# Patient Record
Sex: Female | Born: 1940 | Race: White | Hispanic: No | Smoking: Former smoker
Health system: Southern US, Community
[De-identification: ages and names within clinical notes are randomized; demographics above are authoritative.]

## PROBLEM LIST (undated history)

## (undated) DIAGNOSIS — Z87891 Personal history of nicotine dependence: Secondary | ICD-10-CM

## (undated) DIAGNOSIS — I7100 Dissection of unspecified site of aorta: Secondary | ICD-10-CM

## (undated) DIAGNOSIS — I219 Acute myocardial infarction, unspecified: Secondary | ICD-10-CM

## (undated) DIAGNOSIS — N6019 Diffuse cystic mastopathy of unspecified breast: Secondary | ICD-10-CM

## (undated) DIAGNOSIS — Z8709 Personal history of other diseases of the respiratory system: Secondary | ICD-10-CM

## (undated) DIAGNOSIS — I5022 Chronic systolic (congestive) heart failure: Secondary | ICD-10-CM

## (undated) DIAGNOSIS — Z8719 Personal history of other diseases of the digestive system: Secondary | ICD-10-CM

## (undated) DIAGNOSIS — E785 Hyperlipidemia, unspecified: Secondary | ICD-10-CM

## (undated) DIAGNOSIS — M1990 Unspecified osteoarthritis, unspecified site: Secondary | ICD-10-CM

## (undated) DIAGNOSIS — I255 Ischemic cardiomyopathy: Secondary | ICD-10-CM

## (undated) DIAGNOSIS — D649 Anemia, unspecified: Secondary | ICD-10-CM

## (undated) DIAGNOSIS — I10 Essential (primary) hypertension: Secondary | ICD-10-CM

## (undated) DIAGNOSIS — Z800 Family history of malignant neoplasm of digestive organs: Secondary | ICD-10-CM

## (undated) DIAGNOSIS — I509 Heart failure, unspecified: Secondary | ICD-10-CM

## (undated) DIAGNOSIS — R011 Cardiac murmur, unspecified: Secondary | ICD-10-CM

## (undated) DIAGNOSIS — IMO0002 Reserved for concepts with insufficient information to code with codable children: Secondary | ICD-10-CM

## (undated) DIAGNOSIS — I2510 Atherosclerotic heart disease of native coronary artery without angina pectoris: Secondary | ICD-10-CM

## (undated) DIAGNOSIS — G709 Myoneural disorder, unspecified: Secondary | ICD-10-CM

## (undated) DIAGNOSIS — I4510 Unspecified right bundle-branch block: Secondary | ICD-10-CM

## (undated) DIAGNOSIS — S12600A Unspecified displaced fracture of seventh cervical vertebra, initial encounter for closed fracture: Secondary | ICD-10-CM

## (undated) DIAGNOSIS — T7840XA Allergy, unspecified, initial encounter: Secondary | ICD-10-CM

## (undated) DIAGNOSIS — K5790 Diverticulosis of intestine, part unspecified, without perforation or abscess without bleeding: Secondary | ICD-10-CM

## (undated) DIAGNOSIS — H409 Unspecified glaucoma: Secondary | ICD-10-CM

## (undated) HISTORY — DX: Cardiac murmur, unspecified: R01.1

## (undated) HISTORY — PX: APPENDECTOMY: SHX54

## (undated) HISTORY — DX: Unspecified displaced fracture of seventh cervical vertebra, initial encounter for closed fracture: S12.600A

## (undated) HISTORY — DX: Allergy, unspecified, initial encounter: T78.40XA

## (undated) HISTORY — DX: Family history of malignant neoplasm of digestive organs: Z80.0

## (undated) HISTORY — PX: BLADDER SURGERY: SHX569

## (undated) HISTORY — DX: Chronic systolic (congestive) heart failure: I50.22

## (undated) HISTORY — PX: BREAST BIOPSY: SHX20

## (undated) HISTORY — DX: Diffuse cystic mastopathy of unspecified breast: N60.19

## (undated) HISTORY — DX: Anemia, unspecified: D64.9

## (undated) HISTORY — DX: Acute myocardial infarction, unspecified: I21.9

## (undated) HISTORY — DX: Heart failure, unspecified: I50.9

## (undated) HISTORY — DX: Dissection of unspecified site of aorta: I71.00

## (undated) HISTORY — DX: Reserved for concepts with insufficient information to code with codable children: IMO0002

## (undated) HISTORY — DX: Ischemic cardiomyopathy: I25.5

## (undated) HISTORY — PX: BREAST SURGERY: SHX581

## (undated) HISTORY — DX: Myoneural disorder, unspecified: G70.9

## (undated) HISTORY — DX: Unspecified osteoarthritis, unspecified site: M19.90

## (undated) HISTORY — DX: Diverticulosis of intestine, part unspecified, without perforation or abscess without bleeding: K57.90

## (undated) HISTORY — PX: COLONOSCOPY: SHX174

## (undated) HISTORY — DX: Personal history of other diseases of the digestive system: Z87.19

## (undated) HISTORY — PX: REFRACTIVE SURGERY: SHX103

## (undated) HISTORY — DX: Personal history of other diseases of the respiratory system: Z87.09

## (undated) HISTORY — DX: Atherosclerotic heart disease of native coronary artery without angina pectoris: I25.10

## (undated) HISTORY — DX: Personal history of nicotine dependence: Z87.891

## (undated) HISTORY — DX: Hyperlipidemia, unspecified: E78.5

## (undated) HISTORY — DX: Essential (primary) hypertension: I10

## (undated) HISTORY — DX: Unspecified glaucoma: H40.9

## (undated) HISTORY — PX: CARDIAC CATHETERIZATION: SHX172

## (undated) HISTORY — PX: BREAST EXCISIONAL BIOPSY: SUR124

---

## 1980-05-02 DIAGNOSIS — I10 Essential (primary) hypertension: Secondary | ICD-10-CM

## 1980-05-02 HISTORY — PX: BACK SURGERY: SHX140

## 1980-05-02 HISTORY — DX: Essential (primary) hypertension: I10

## 1989-05-02 HISTORY — PX: ABDOMINAL HYSTERECTOMY: SHX81

## 1992-05-02 HISTORY — PX: CHOLECYSTECTOMY: SHX55

## 2003-05-03 DIAGNOSIS — H409 Unspecified glaucoma: Secondary | ICD-10-CM

## 2003-05-03 DIAGNOSIS — Z8719 Personal history of other diseases of the digestive system: Secondary | ICD-10-CM

## 2003-05-03 HISTORY — DX: Personal history of other diseases of the digestive system: Z87.19

## 2003-05-03 HISTORY — DX: Unspecified glaucoma: H40.9

## 2004-02-03 ENCOUNTER — Ambulatory Visit: Payer: Self-pay | Admitting: General Surgery

## 2004-02-09 ENCOUNTER — Ambulatory Visit: Payer: Self-pay | Admitting: Family Medicine

## 2005-02-03 ENCOUNTER — Ambulatory Visit: Payer: Self-pay | Admitting: General Surgery

## 2005-06-08 ENCOUNTER — Ambulatory Visit: Payer: Self-pay | Admitting: Cardiology

## 2005-08-08 ENCOUNTER — Ambulatory Visit: Payer: Self-pay | Admitting: Internal Medicine

## 2005-09-22 ENCOUNTER — Ambulatory Visit: Payer: Self-pay | Admitting: Internal Medicine

## 2005-09-26 ENCOUNTER — Ambulatory Visit: Payer: Self-pay | Admitting: Internal Medicine

## 2006-04-04 ENCOUNTER — Ambulatory Visit: Payer: Self-pay | Admitting: General Surgery

## 2006-09-14 ENCOUNTER — Ambulatory Visit: Payer: PRIVATE HEALTH INSURANCE | Admitting: General Surgery

## 2007-04-05 ENCOUNTER — Ambulatory Visit: Payer: PRIVATE HEALTH INSURANCE | Admitting: General Surgery

## 2008-01-30 ENCOUNTER — Ambulatory Visit: Payer: Self-pay | Admitting: Family Medicine

## 2008-01-31 ENCOUNTER — Ambulatory Visit: Payer: Self-pay | Admitting: Family Medicine

## 2008-03-02 ENCOUNTER — Ambulatory Visit: Payer: Self-pay | Admitting: Family Medicine

## 2008-04-01 ENCOUNTER — Ambulatory Visit: Payer: Self-pay | Admitting: Family Medicine

## 2008-04-07 ENCOUNTER — Ambulatory Visit: Payer: Self-pay | Admitting: General Surgery

## 2008-06-05 ENCOUNTER — Ambulatory Visit: Payer: Self-pay | Admitting: Urology

## 2009-04-08 ENCOUNTER — Ambulatory Visit: Payer: Self-pay | Admitting: General Surgery

## 2010-04-12 ENCOUNTER — Ambulatory Visit: Payer: Self-pay | Admitting: General Surgery

## 2010-05-21 ENCOUNTER — Ambulatory Visit: Payer: PRIVATE HEALTH INSURANCE | Admitting: Family Medicine

## 2010-08-03 ENCOUNTER — Ambulatory Visit (INDEPENDENT_AMBULATORY_CARE_PROVIDER_SITE_OTHER): Payer: Medicare Other | Admitting: Cardiovascular Disease

## 2010-08-03 ENCOUNTER — Encounter: Payer: Self-pay | Admitting: Cardiovascular Disease

## 2010-08-03 DIAGNOSIS — I2510 Atherosclerotic heart disease of native coronary artery without angina pectoris: Secondary | ICD-10-CM | POA: Insufficient documentation

## 2010-08-03 DIAGNOSIS — I10 Essential (primary) hypertension: Secondary | ICD-10-CM

## 2010-08-03 DIAGNOSIS — E119 Type 2 diabetes mellitus without complications: Secondary | ICD-10-CM

## 2010-08-03 DIAGNOSIS — I152 Hypertension secondary to endocrine disorders: Secondary | ICD-10-CM | POA: Insufficient documentation

## 2010-08-03 DIAGNOSIS — E1159 Type 2 diabetes mellitus with other circulatory complications: Secondary | ICD-10-CM | POA: Insufficient documentation

## 2010-08-03 DIAGNOSIS — E785 Hyperlipidemia, unspecified: Secondary | ICD-10-CM

## 2010-08-03 MED ORDER — LISINOPRIL 40 MG PO TABS: 40.0000 mg | ORAL_TABLET | Freq: Every day | ORAL | Status: DC

## 2010-08-03 NOTE — Assessment & Plan Note (Signed)
Total cholesterol is 202, LDL 104, HDL 82, We will discuss this with her on her next visit. Ideally, given mild coronary artery disease and underlying diabetes, we should pain to push her LDL lower.

## 2010-08-03 NOTE — Assessment & Plan Note (Signed)
Diabetes is reasonably well controlled with hemoglobin A1c 6.8.

## 2010-08-03 NOTE — Progress Notes (Signed)
   Patient ID: Jessica Wheeler, female    DOB: Aug 08, 1940, 70 y.o.   MRN: [id number]  HPI Comments: Ms. Mckellar is a very pleasant 70 year old woman with a history of diabetes, hypertension, smoking, mild coronary artery disease by cardiac catheterization in 2007 who presents for evaluation after her recent echocardiogram showed abnormalities also to establish care.  She reports that in general she does well. She has been working on her hemoglobin A1c which is 6.8. She reports that her father died of a heart attack at age 23. Mother passed away at age 47. Her workup has included a cardiac catheterization in 2007 that from what I understand was because of syncope. The catheterization showed 20% proximal RCA disease and 20% LAD disease.  She feels well and does not have any significant symptoms. No chest pain or shortness of breath with exertion.  Her echocardiogram was essentially normal. It did show diastolic dysfunction, mild LVH, otherwise no significant abnormalities of concern. This was performed on January 2012.  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 She has been following with Dr. Wyn Quaker for this.  EKG today shows normal sinus rhythm with rate 74 beats per minute, nonspecific intraventricular conduction delay     Review of Systems  Constitutional: Negative.   HENT: Negative.   Eyes: Negative.   Respiratory: Negative.   Cardiovascular: Negative.   Gastrointestinal: Negative.   Musculoskeletal: Negative.   Skin: Negative.   Neurological: Negative.   Hematological: Negative.   Psychiatric/Behavioral: Negative.   All other systems reviewed and are negative.    BP 162/68  Pulse 74  Ht 5\' 3"  (1.6 m)  Wt 144 lb 1.9 oz (65.372 kg)  BMI 25.53 kg/m2   Physical Exam  Nursing note and vitals reviewed. Constitutional: She is oriented to person, place, and time. She appears well-developed and well-nourished.  HENT:  Head: Normocephalic.    Nose: Nose normal.  Mouth/Throat: Oropharynx is clear and moist.  Eyes: Conjunctivae are normal. Pupils are equal, round, and reactive to light.  Neck: Normal range of motion. Neck supple. No JVD present.  Cardiovascular: Normal rate, regular rhythm, normal heart sounds and intact distal pulses.  Exam reveals no gallop and no friction rub.   No murmur heard. Pulmonary/Chest: Effort normal and breath sounds normal. No respiratory distress. She has no wheezes. She has no rales. She exhibits no tenderness.  Abdominal: Soft. Bowel sounds are normal. She exhibits no distension. There is no tenderness.  Musculoskeletal: Normal range of motion. She exhibits no edema and no tenderness.  Lymphadenopathy:    She has no cervical adenopathy.  Neurological: She is alert and oriented to person, place, and time. Coordination normal.  Skin: Skin is warm and dry. No rash noted. No erythema.  Psychiatric: She has a normal mood and affect. Her behavior is normal. Judgment and thought content normal.    Assessment and Plan

## 2010-08-03 NOTE — Assessment & Plan Note (Signed)
Blood pressure is very elevated today with repeat blood pressure 180 systolic. We will start lisinopril 20 mg daily. I have her call in to the clinic with her blood pressures in the next several days and increase this to lisinopril 40 mg daily if needed. Additionally, we can increase her amlodipine to 10 mg daily for systolic pressures greater than 135.

## 2010-08-03 NOTE — Assessment & Plan Note (Signed)
She has minimal coronary artery disease by cardiac catheterization 2007. We have encouraged her to continue to watch her diet and exercise.  We did receive her cholesterol after she had left the clinic. We will discuss this with her on her next visit.

## 2010-08-03 NOTE — Patient Instructions (Signed)
START Lisinopril 40mg , take 1/2 tablet once daily to start.  Check BP results for 1 week and write down results and call our office to update how your BP is running.  If BP remains stable (not too low) you can then increase to 1 tablet once daily. Your physician recommends that you schedule a follow-up appointment in: 1 month

## 2010-08-04 ENCOUNTER — Other Ambulatory Visit: Payer: Self-pay | Admitting: Emergency Medicine

## 2010-08-04 MED ORDER — LISINOPRIL 40 MG PO TABS: 40.0000 mg | ORAL_TABLET | Freq: Every day | ORAL | Status: DC

## 2010-08-04 NOTE — Telephone Encounter (Signed)
rx called in to correct pharmacy .

## 2010-08-10 ENCOUNTER — Telehealth: Payer: Self-pay | Admitting: *Deleted

## 2010-08-10 NOTE — Telephone Encounter (Signed)
Lets try to increase amlodipine to 10 mg daily to start, Monitor BP

## 2010-08-10 NOTE — Telephone Encounter (Signed)
Pt called stating since starting Lisinopril she has developed symptoms of "feels like my legs are extremely heavy and a band is wrapped around my head." Pt was here 08/03/10 and we had wanted her to start low on Lisinopril (20mg ) with hopes to incr later to 40mg  after pt monitors BP. Pt with BP results today of averaging mid-130s-low 140s/ 55-65. Pt has not even taken the 20mg , she is cutting pill in 1/4 (10mg  daily) and presenting with these symptoms. Pt is taking Moduretic 5-50mg  qd and Amlodipine 5mg , her pharmacist had suggested she could have reaction with the Moduretic and Lisinopril. I have advised pt to hold Lisinopril for now and will call her back after talking to Dr. Mariah Milling. Please advise.

## 2010-08-11 NOTE — Telephone Encounter (Signed)
Spoke to pt, she states she actually has been doing better, she has continued to take Lisinopril 10mg . Pt has f/u in May 2012 with Dr. Mariah Milling. Gave pt recommendation below. Notified her that if she developed problems again prior to ov, to call our office.

## 2010-08-18 ENCOUNTER — Encounter: Payer: Self-pay | Admitting: Cardiovascular Disease

## 2010-08-19 ENCOUNTER — Encounter: Payer: Self-pay | Admitting: Cardiovascular Disease

## 2010-09-08 ENCOUNTER — Ambulatory Visit (INDEPENDENT_AMBULATORY_CARE_PROVIDER_SITE_OTHER): Payer: Medicare Other | Admitting: Cardiovascular Disease

## 2010-09-08 ENCOUNTER — Ambulatory Visit: Payer: No Typology Code available for payment source | Admitting: Cardiovascular Disease

## 2010-09-08 ENCOUNTER — Encounter: Payer: Self-pay | Admitting: Cardiovascular Disease

## 2010-09-08 DIAGNOSIS — E785 Hyperlipidemia, unspecified: Secondary | ICD-10-CM

## 2010-09-08 DIAGNOSIS — E119 Type 2 diabetes mellitus without complications: Secondary | ICD-10-CM

## 2010-09-08 DIAGNOSIS — I2510 Atherosclerotic heart disease of native coronary artery without angina pectoris: Secondary | ICD-10-CM

## 2010-09-08 DIAGNOSIS — I10 Essential (primary) hypertension: Secondary | ICD-10-CM

## 2010-09-08 NOTE — Patient Instructions (Addendum)
You are doing well. Increase the amlodipine to 10 mg daily.  Monitor blood pressure.  Please call us if you have new issues that need to be addressed before your next appt.  We will call you for a follow up Appt. In 6 months

## 2010-09-08 NOTE — Assessment & Plan Note (Signed)
Blood pressure is elevated today. She reports systolic pressures on average 161. As she is diabetic, we have suggested she try to get her systolic pressure lower, preferably less than 135. We have suggested she try to increase her amlodipine to 10 mg a day. She will monitor her blood pressure. She is scheduled to see Dr. Elease Hashimoto in followup.

## 2010-09-08 NOTE — Assessment & Plan Note (Signed)
She is not particularly interested in starting a medication at this time. We have suggested she try a red yeast rice.

## 2010-09-08 NOTE — Assessment & Plan Note (Signed)
She does have mild coronary artery disease and mild carotid arterial disease. We have stressed to her the importance of good diabetes control and low cholesterol. Ideally we would like an LDL less than 100, preferably closer to 70

## 2010-09-08 NOTE — Progress Notes (Signed)
   Patient ID: Jessica Wheeler, female    DOB: December 24, 1940, 70 y.o.   MRN: [id number]  HPI Comments: Ms. Lirette is a very pleasant 70 year old woman with a history of diabetes, hypertension, smoking, mild coronary artery disease by cardiac catheterization in 2007 who presents for Routine followup. cardiac catheterization in 2007  showed 20% proximal RCA disease and 20% LAD disease.  She feels well and does not have any significant symptoms.  She walks numerous miles a week and has no significant symptoms of chest pain or shortness of breath. She does state that her blood pressure at home is typically 140 on a regular basis. It is higher than that today. She could not tolerate higher dose lisinopril because of a cough. She does not have any lower extremity edema. She otherwise has no complaints.   She reports that her father died of a heart attack at age 38. Mother passed away at age 78.   Her echocardiogram January 2012 was essentially normal. It did show diastolic dysfunction, mild LVH, otherwise no significant abnormalities of concern.    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 She has been following with Dr. Wyn Quaker for this.  Old EKG today shows normal sinus rhythm with rate 74 beats per minute, nonspecific intraventricular conduction delay      Review of Systems  Constitutional: Negative.   HENT: Negative.   Eyes: Negative.   Respiratory: Negative.   Cardiovascular: Negative.   Gastrointestinal: Negative.   Musculoskeletal: Negative.   Skin: Negative.   Neurological: Negative.   Hematological: Negative.   Psychiatric/Behavioral: Negative.   All other systems reviewed and are negative.    BP 158/72  Pulse 80  Ht 5' 3.5" (1.613 m)  Wt 126 lb 12.8 oz (57.516 kg)  BMI 22.11 kg/m2   Physical Exam  Nursing note and vitals reviewed. Constitutional: She is oriented to person, place, and time. She appears well-developed and  well-nourished.  HENT:  Head: Normocephalic.  Nose: Nose normal.  Mouth/Throat: Oropharynx is clear and moist.  Eyes: Conjunctivae are normal. Pupils are equal, round, and reactive to light.  Neck: Normal range of motion. Neck supple. No JVD present.  Cardiovascular: Normal rate, regular rhythm, S1 normal, S2 normal, normal heart sounds and intact distal pulses.  Exam reveals no gallop and no friction rub.   No murmur heard. Pulmonary/Chest: Effort normal and breath sounds normal. No respiratory distress. She has no wheezes. She has no rales. She exhibits no tenderness.  Abdominal: Soft. Bowel sounds are normal. She exhibits no distension. There is no tenderness.  Musculoskeletal: Normal range of motion. She exhibits no edema and no tenderness.  Lymphadenopathy:    She has no cervical adenopathy.  Neurological: She is alert and oriented to person, place, and time. Coordination normal.  Skin: Skin is warm and dry. No rash noted. No erythema.  Psychiatric: She has a normal mood and affect. Her behavior is normal. Judgment and thought content normal.         Assessment and Plan

## 2010-09-08 NOTE — Assessment & Plan Note (Signed)
We have encouraged continued exercise, careful diet management in an effort to lose weight. 

## 2010-09-13 ENCOUNTER — Telehealth: Payer: Self-pay | Admitting: *Deleted

## 2010-09-13 NOTE — Telephone Encounter (Signed)
Pt called this am stating that at last ov she was instructed to double dosage of Amlodipine from 5 to 10. The first day she doubled dose, she was at a family member's graduation and upon leaving had episode of "felt like something was tied around my head," broke out in cold sweat, legs were weak. Pt unable to know BP at that time, but did take as soon as she got home and SBP was 180. She did have vomiting and sever chills/and cramps as well. Pt did stat she had a low grade fever, but did not check after that. Pt also checked blood sugar at time of episode, and was normal. Notified pt that difficult to know for sure the cause, however, it does sound like she could have had a virus with some of the symptoms reported. Advised pt to monitor BP for the next couple days, and to take 1 1/2 tab of Amlodpine 5mg  and monitor BP after increase, and to do this while she is at home. Pt will call at the first sign of any symptoms, and if BP remains normal and no symptoms reported, pt will incr to 2 tab (10mg ).

## 2010-09-28 ENCOUNTER — Telehealth: Payer: Self-pay

## 2010-09-28 NOTE — Telephone Encounter (Signed)
Patient having trouble sleeping and has a dry cough with the Lisinopril 5 mg takes one tablet daily. She was told at last office visit may need to take something similar but can't take the beta blockers. Her blood pressure reading yesterday 148/58 she does not take the heart rate.  What other medication do you recommend she take?  Please call [phone].

## 2010-09-29 ENCOUNTER — Other Ambulatory Visit: Payer: Self-pay

## 2010-09-29 MED ORDER — LOSARTAN POTASSIUM 50 MG PO TABS: ORAL_TABLET | ORAL | Status: DC

## 2010-09-29 NOTE — Telephone Encounter (Signed)
Patient will take the losartan instead of trying to increase the amlodipine to 10 mg again.  She felt very sick on stomach last time she increased it.l

## 2010-09-29 NOTE — Telephone Encounter (Signed)
She will stop the lisinopril due to a cough. A Rx was sent to [facility] for losartan 50 mg 1/2 tablet daily.

## 2010-09-29 NOTE — Telephone Encounter (Signed)
Notified patient if taking amlodipine 5 mg should increase to 10 mg and stop the lisinopril. She has tried the amlodipine 10 mg and got very sick 30 minutes after taking it so she will add losartan 50 mg take 1/2 tablet daily.   Please call into  [facility] [address].

## 2010-09-29 NOTE — Telephone Encounter (Signed)
Is she taking amlodipine 5 or 10? If she thinks lisinopril is causing a cough, she can stop. Go to amlodipine 10. If already on amlodipine 10, would start losartan 50 mg daily (could cut in 1/2 for first week and monitor BP)

## 2011-03-31 ENCOUNTER — Ambulatory Visit (INDEPENDENT_AMBULATORY_CARE_PROVIDER_SITE_OTHER): Payer: Medicare Other | Admitting: Cardiovascular Disease

## 2011-03-31 ENCOUNTER — Encounter: Payer: Self-pay | Admitting: Cardiovascular Disease

## 2011-03-31 DIAGNOSIS — I7102 Dissection of abdominal aorta: Secondary | ICD-10-CM

## 2011-03-31 DIAGNOSIS — E785 Hyperlipidemia, unspecified: Secondary | ICD-10-CM

## 2011-03-31 DIAGNOSIS — E119 Type 2 diabetes mellitus without complications: Secondary | ICD-10-CM

## 2011-03-31 DIAGNOSIS — I10 Essential (primary) hypertension: Secondary | ICD-10-CM

## 2011-03-31 DIAGNOSIS — I7100 Dissection of unspecified site of aorta: Secondary | ICD-10-CM

## 2011-03-31 DIAGNOSIS — I2510 Atherosclerotic heart disease of native coronary artery without angina pectoris: Secondary | ICD-10-CM

## 2011-03-31 HISTORY — DX: Dissection of abdominal aorta: I71.02

## 2011-03-31 NOTE — Assessment & Plan Note (Signed)
Currently with no symptoms of angina. No further workup at this time. Continue current medication regimen. 

## 2011-03-31 NOTE — Progress Notes (Signed)
Patient ID: Jessica Wheeler, female    DOB: 03/15/1941, 70 y.o.   MRN: [id number]  HPI Comments: Ms. Picking is a very pleasant 70 year old woman with a history of diabetes, hypertension, smoking, mild coronary artery disease by cardiac catheterization in 2007 who presents for Routine followup. 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   She feels well and does not have any significant symptoms.  She walks numerous miles a week and has no significant symptoms of chest pain or shortness of breath. She does state that her blood pressure at home has been well controlled. She has been decreasing the amlodipine and increasing the losartan as the amlodipine does not make her feel well.  She could not tolerate higher dose lisinopril because of a cough. She does not have any lower extremity edema. She otherwise has no complaints.   She reports that her father died of a heart attack at age 24. Mother passed away at age 17.   Her echocardiogram January 2012 was essentially normal. It did show diastolic dysfunction, mild LVH, otherwise no significant abnormalities of concern.     EKG today shows normal sinus rhythm with rate 74 beats per minute, RBBB    Outpatient Encounter Prescriptions as of 03/31/2011  Medication Sig Dispense Refill  . amiloride-hydrochlorothiazide (MODURETIC) 5-50 MG tablet Take 1 tablet by mouth daily.        Marland Kitchen amLODipine (NORVASC) 2.5 MG tablet Take 2.5 mg by mouth daily.        Marland Kitchen aspirin 81 MG EC tablet Take 81 mg by mouth daily.        Marland Kitchen CINNAMON PO Take by mouth as directed.        . Ibuprofen (ADVIL) 200 MG CAPS Take 1 capsule by mouth daily.        Marland Kitchen latanoprost (XALATAN) 0.005 % ophthalmic solution Place 1 drop into both eyes at bedtime.        Marland Kitchen losartan (COZAAR) 50 MG tablet Take 50 mg by mouth daily.        . Magnesium 250 MG TABS Take 250 mg by mouth daily.         . metFORMIN (GLUCOPHAGE) 500 MG tablet Take 1,000 mg by mouth 2 (two) times daily with a meal.          Review of Systems  Constitutional: Negative.   HENT: Negative.   Eyes: Negative.   Respiratory: Negative.   Cardiovascular: Negative.   Gastrointestinal: Negative.   Musculoskeletal: Negative.   Skin: Negative.   Neurological: Negative.   Hematological: Negative.   Psychiatric/Behavioral: Negative.   All other systems reviewed and are negative.    BP 140/62  Pulse 74  Resp 16  Ht 5' 3.5" (1.613 m)  Wt 145 lb (65.772 kg)  BMI 25.28 kg/m2   Physical Exam  Nursing note and vitals reviewed. Constitutional: She is oriented to person, place, and time. She appears well-developed and well-nourished.  HENT:  Head: Normocephalic.  Nose: Nose normal.  Mouth/Throat: Oropharynx is clear and moist.  Eyes: Conjunctivae are normal. Pupils are equal, round, and reactive to light.  Neck: Normal range of motion. Neck supple. No JVD present.  Cardiovascular: Normal rate, regular rhythm, S1 normal, S2 normal, normal heart sounds and intact distal pulses.  Exam reveals no gallop and no friction rub.   No murmur heard. Pulmonary/Chest: Effort normal and  breath sounds normal. No respiratory distress. She has no wheezes. She has no rales. She exhibits no tenderness.  Abdominal: Soft. Bowel sounds are normal. She exhibits no distension. There is no tenderness.  Musculoskeletal: Normal range of motion. She exhibits no edema and no tenderness.  Lymphadenopathy:    She has no cervical adenopathy.  Neurological: She is alert and oriented to person, place, and time. Coordination normal.  Skin: Skin is warm and dry. No rash noted. No erythema.  Psychiatric: She has a normal mood and affect. Her behavior is normal. Judgment and thought content normal.         Assessment and Plan

## 2011-03-31 NOTE — Assessment & Plan Note (Signed)
Blood pressure is well controlled on today's visit. No changes made to the medications. She could possibly hold her amlodipine and increase the losartan to 100 mg daily if needed.

## 2011-03-31 NOTE — Assessment & Plan Note (Signed)
We have encouraged continued exercise, careful diet management in an effort to lose weight. 

## 2011-03-31 NOTE — Assessment & Plan Note (Signed)
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 Seen on CT scan in 2010. She has requested that we follow this with annual ultrasounds. We will schedule her for a repeat ultrasound next year at this time.

## 2011-03-31 NOTE — Patient Instructions (Signed)
You are doing well. No medication changes were made.  We will schedule an aorta and lower extremity ultrasound in one year (Nov 2013) with ABIs  Please call us if you have new issues that need to be addressed before your next appt.  The office will contact you for a follow up Appt. In 12 months

## 2011-03-31 NOTE — Assessment & Plan Note (Signed)
She is not currently on a statin. We have encouraged exercise as she is doing and good dietary habits. Minimal coronary artery disease by history.

## 2011-04-14 ENCOUNTER — Ambulatory Visit: Payer: PRIVATE HEALTH INSURANCE | Admitting: General Surgery

## 2011-10-01 ENCOUNTER — Ambulatory Visit: Payer: Self-pay | Admitting: Family Medicine

## 2011-10-01 LAB — COMPREHENSIVE METABOLIC PANEL
Albumin: 4.2 g/dL (ref 3.4–5.0)
Alkaline Phosphatase: 53 U/L (ref 50–136)
Anion Gap: 11 (ref 7–16)
BUN: 12 mg/dL (ref 7–18)
Bilirubin,Total: 0.8 mg/dL (ref 0.2–1.0)
Calcium, Total: 9.2 mg/dL (ref 8.5–10.1)
Chloride: 91 mmol/L — ABNORMAL LOW (ref 98–107)
Co2: 27 mmol/L (ref 21–32)
Creatinine: 0.72 mg/dL (ref 0.60–1.30)
EGFR (African American): >60
EGFR (Non-African Amer.): >60
Glucose: 163 mg/dL — ABNORMAL HIGH (ref 65–99)
Osmolality: 262 (ref 275–301)
Potassium: 3.3 mmol/L — ABNORMAL LOW (ref 3.5–5.1)
SGOT(AST): 33 U/L (ref 15–37)
SGPT (ALT): 45 U/L
Sodium: 129 mmol/L — ABNORMAL LOW (ref 136–145)
Total Protein: 7.9 g/dL (ref 6.4–8.2)

## 2011-10-01 LAB — CBC WITH DIFFERENTIAL/PLATELET
Basophil #: 0.1 10*3/uL (ref 0.0–0.1)
Basophil %: 0.7 %
Eosinophil #: 0.1 10*3/uL (ref 0.0–0.7)
Eosinophil %: 0.8 %
HCT: 40.4 % (ref 35.0–47.0)
HGB: 13.6 g/dL (ref 12.0–16.0)
Lymphocyte #: 0.3 10*3/uL — ABNORMAL LOW (ref 1.0–3.6)
Lymphocyte %: 2.3 %
MCH: 30.7 pg (ref 26.0–34.0)
MCHC: 33.7 g/dL (ref 32.0–36.0)
MCV: 91 fL (ref 80–100)
Monocyte #: 0.3 x10 3/mm (ref 0.2–0.9)
Monocyte %: 2.2 %
Neutrophil #: 13.4 10*3/uL — ABNORMAL HIGH (ref 1.4–6.5)
Neutrophil %: 94 %
Platelet: 247 10*3/uL (ref 150–440)
RBC: 4.44 10*6/uL (ref 3.80–5.20)
RDW: 13.5 % (ref 11.5–14.5)
WBC: 14.3 10*3/uL — ABNORMAL HIGH (ref 3.6–11.0)

## 2011-10-07 ENCOUNTER — Other Ambulatory Visit: Payer: Self-pay | Admitting: Family Medicine

## 2011-10-07 ENCOUNTER — Telehealth: Payer: Self-pay

## 2011-10-07 LAB — PRO B NATRIURETIC PEPTIDE: B-Type Natriuretic Peptide: 26 pg/mL (ref 0–125)

## 2011-10-07 LAB — CK: CK, Total: 64 U/L (ref 21–215)

## 2011-10-07 LAB — CK-MB: CK-MB: 1.3 ng/mL (ref 0.5–3.6)

## 2011-10-07 LAB — TROPONIN I: Troponin-I: <0.02 ng/mL

## 2011-10-07 NOTE — Telephone Encounter (Signed)
LM on cell # re: appt for Monday

## 2011-10-07 NOTE — Telephone Encounter (Signed)
Dr. Mariah Milling spoke with pt.s PCP re: some symptoms she was having. He has ordered pt to go to [facility] today for STAT labs and f/u with Dr. Kirke Corin next week. He asks that we call pt with appt info.  I attempted to call pt and LMTCB

## 2011-10-10 ENCOUNTER — Encounter: Payer: Self-pay | Admitting: Cardiovascular Disease

## 2011-10-10 ENCOUNTER — Ambulatory Visit (INDEPENDENT_AMBULATORY_CARE_PROVIDER_SITE_OTHER): Payer: Medicare Other | Admitting: Cardiovascular Disease

## 2011-10-10 VITALS — BP 140/60 | HR 71 | Ht 64.0 in | Wt 145.0 lb

## 2011-10-10 DIAGNOSIS — R079 Chest pain, unspecified: Secondary | ICD-10-CM | POA: Insufficient documentation

## 2011-10-10 DIAGNOSIS — R011 Cardiac murmur, unspecified: Secondary | ICD-10-CM | POA: Insufficient documentation

## 2011-10-10 DIAGNOSIS — I2510 Atherosclerotic heart disease of native coronary artery without angina pectoris: Secondary | ICD-10-CM

## 2011-10-10 NOTE — Progress Notes (Signed)
HPI  Jessica Wheeler is a very pleasant 71 year old woman with a history of diabetes, hypertension, smoking, mild coronary artery disease by cardiac catheterization in 2007 who presents for urgent evaluation. 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  She feels well and does not have any significant symptoms.  Her echocardiogram January 2012 was essentially normal. It did show diastolic dysfunction, mild LVH, otherwise no significant abnormalities of concern.  She normally follows with Dr.Gollan but an urgent appointment was requested by Dr. Elease Hashimoto for chest pain. This started a few weeks ago. According to the patient she had a recent urinary tract infection and possible bronchitis treated with Levaquin. She had chest tightness last week at rest which lasted for about 30 minutes. It was associated with mild dyspnea. She was sent to the emergency room at [facility] where her cardiac enzymes and d-dimer were within normal limits. She did have more episodes of milder discomfort. Some of her symptoms happened with activities as well as at rest. She is currently feeling better than last week.   Allergies  Allergen Reactions  . Beta Adrenergic Blockers     Chest pressure/difficulty breathing     Current Outpatient Prescriptions on File Prior to Visit  Medication Sig Dispense Refill  . amiloride-hydrochlorothiazide (MODURETIC) 5-50 MG tablet Take 1 tablet by mouth daily.        Marland Kitchen amLODipine (NORVASC) 2.5 MG tablet Take 2.5 mg by mouth daily.        Marland Kitchen aspirin 81 MG EC tablet Take 81 mg by mouth daily.        Marland Kitchen CINNAMON PO Take by mouth as directed.        . Ibuprofen (ADVIL) 200 MG CAPS Take 1 capsule by mouth daily.        Marland Kitchen latanoprost (XALATAN) 0.005 % ophthalmic solution Place 1 drop into both eyes at bedtime.        Marland Kitchen losartan (COZAAR) 50 MG tablet Take 50 mg by mouth daily.        .  Magnesium 250 MG TABS Take 250 mg by mouth daily.        . metFORMIN (GLUCOPHAGE) 500 MG tablet Take 1,000 mg by mouth 2 (two) times daily with a meal.           Past Medical History  Diagnosis Date  . Hypertension   . Diabetes mellitus   . Dissection, aorta     Dr.Dew follows     Past Surgical History  Procedure Date  . Back surgery 1982    lower surgery  . Cardiac catheterization     Dr. Lady Gary, few years ago  . Cholecystectomy 1994  . Abdominal hysterectomy 1991     Family History  Problem Relation Age of Onset  . Diabetes Mother   . Hypertension Mother   . Hypertension Father   . Heart disease Father   . Colon cancer Brother   . Hypertension Maternal Grandmother   . Hypertension Maternal Grandfather      History   Social History  . Marital Status: Widowed    Spouse Name: N/A    Number of Children: N/A  . Years of Education: N/A   Occupational History  . Not on file.   Social History Main Topics  . Smoking status: Former Smoker -- 1.0 packs/day for 15 years    Types: Cigarettes  Quit date: 08/03/2003  . Smokeless tobacco: Never Used  . Alcohol Use: No  . Drug Use: No  . Sexually Active: Not on file   Other Topics Concern  . Not on file   Social History Narrative  . No narrative on file     ROS Constitutional: Negative for fever, chills, diaphoresis, activity change, appetite change and fatigue.  HENT: Negative for hearing loss, nosebleeds, congestion, sore throat, facial swelling, drooling, trouble swallowing, neck pain, voice change, sinus pressure and tinnitus.  Eyes: Negative for photophobia, pain, discharge and visual disturbance.  Respiratory: Negative for apnea, cough,  and wheezing.  Cardiovascular: Negative for  palpitations and leg swelling.  Gastrointestinal: Negative for nausea, vomiting, abdominal pain, diarrhea, constipation, blood in stool and abdominal distention.  Genitourinary: Negative for dysuria, urgency, frequency,  hematuria and decreased urine volume.  Musculoskeletal: Negative for myalgias, back pain, joint swelling, arthralgias and gait problem.  Skin: Negative for color change, pallor, rash and wound.  Neurological: Negative for dizziness, tremors, seizures, syncope, speech difficulty, weakness, light-headedness, numbness and headaches.  Psychiatric/Behavioral: Negative for suicidal ideas, hallucinations, behavioral problems and agitation. The patient is not nervous/anxious.     PHYSICAL EXAM   BP 140/60  Pulse 71  Ht 5\' 4"  (1.626 m)  Wt 145 lb (65.772 kg)  BMI 24.89 kg/m2 Constitutional: She is oriented to person, place, and time. She appears well-developed and well-nourished. No distress.  HENT: No nasal discharge.  Head: Normocephalic and atraumatic.  Eyes: Pupils are equal and round. Right eye exhibits no discharge. Left eye exhibits no discharge.  Neck: Normal range of motion. Neck supple. No JVD present. No thyromegaly present.  Cardiovascular: Normal rate, regular rhythm, normal heart sounds. Exam reveals no gallop and no friction rub. There is a 2/6 systolic ejection murmur at the aortic area which is early peaking.  Pulmonary/Chest: Effort normal and breath sounds normal. No stridor. No respiratory distress. She has no wheezes. She has no rales. She exhibits no tenderness.  Abdominal: Soft. Bowel sounds are normal. She exhibits no distension. There is no tenderness. There is no rebound and no guarding.  Musculoskeletal: Normal range of motion. She exhibits no edema and no tenderness.  Neurological: She is alert and oriented to person, place, and time. Coordination normal.  Skin: Skin is warm and dry. No rash noted. She is not diaphoretic. No erythema. No pallor.  Psychiatric: She has a normal mood and affect. Her behavior is normal. Judgment and thought content normal.     EKG: Sinus  Rhythm  -Right bundle branch block.   ABNORMAL    ASSESSMENT AND PLAN

## 2011-10-10 NOTE — Assessment & Plan Note (Signed)
Her chest pain has improved overall. It has some anginal and some atypical features. She is known to have mild nonobstructive coronary artery disease on previous cardiac catheterization. She also has multiple risk factors for coronary artery disease including hypertension, diabetes and hyperlipidemia. I recommend proceeding with a treadmill nuclear stress test for further evaluation. 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 If cardiac catheterization is needed, the radial artery approach might be preferable.

## 2011-10-10 NOTE — Patient Instructions (Signed)
Your physician has requested that you have en exercise stress myoview. For further information please visit [email or web address] Please follow instruction sheet, as given.  Your physician has requested that you have an echocardiogram. Echocardiography is a painless test that uses sound waves to create images of your heart. It provides your doctor with information about the size and shape of your heart and how well your heart's chambers and valves are working. This procedure takes approximately one hour. There are no restrictions for this procedure.  Follow up with me or Dr. Mariah Milling after tests.

## 2011-10-10 NOTE — Assessment & Plan Note (Signed)
The patient has an aortic murmur is no recent evaluation. She does not seem to have significant aortic stenosis by physical exam. Given her symptoms, an echocardiogram will be requested for further evaluation.

## 2011-10-11 ENCOUNTER — Ambulatory Visit: Payer: Self-pay | Admitting: Cardiovascular Disease

## 2011-10-11 DIAGNOSIS — R079 Chest pain, unspecified: Secondary | ICD-10-CM

## 2011-10-12 ENCOUNTER — Telehealth: Payer: Self-pay

## 2011-10-12 NOTE — Telephone Encounter (Signed)
"  Call pt and tell her stress test was normal". V.O Dr. Adline Mango, RN  Pt informed. Understanding verb.

## 2011-10-18 ENCOUNTER — Other Ambulatory Visit: Payer: Self-pay | Admitting: Cardiovascular Disease

## 2011-10-18 DIAGNOSIS — R079 Chest pain, unspecified: Secondary | ICD-10-CM

## 2011-11-01 ENCOUNTER — Other Ambulatory Visit (INDEPENDENT_AMBULATORY_CARE_PROVIDER_SITE_OTHER): Payer: Medicare Other

## 2011-11-01 ENCOUNTER — Other Ambulatory Visit: Payer: Self-pay

## 2011-11-01 DIAGNOSIS — R011 Cardiac murmur, unspecified: Secondary | ICD-10-CM

## 2011-11-02 ENCOUNTER — Encounter: Payer: Self-pay | Admitting: Cardiovascular Disease

## 2011-11-02 ENCOUNTER — Ambulatory Visit (INDEPENDENT_AMBULATORY_CARE_PROVIDER_SITE_OTHER): Payer: Medicare Other | Admitting: Cardiovascular Disease

## 2011-11-02 VITALS — BP 152/68 | HR 79 | Ht 63.5 in | Wt 145.8 lb

## 2011-11-02 DIAGNOSIS — E785 Hyperlipidemia, unspecified: Secondary | ICD-10-CM

## 2011-11-02 DIAGNOSIS — R0602 Shortness of breath: Secondary | ICD-10-CM

## 2011-11-02 DIAGNOSIS — E119 Type 2 diabetes mellitus without complications: Secondary | ICD-10-CM

## 2011-11-02 DIAGNOSIS — I2510 Atherosclerotic heart disease of native coronary artery without angina pectoris: Secondary | ICD-10-CM

## 2011-11-02 DIAGNOSIS — I10 Essential (primary) hypertension: Secondary | ICD-10-CM

## 2011-11-02 NOTE — Assessment & Plan Note (Signed)
Currently with no symptoms of angina. No further workup at this time. Continue current medication regimen. 

## 2011-11-02 NOTE — Patient Instructions (Addendum)
You are doing well. Cut the diuretic in 1/2 (HCTZ) Increase the losartan to 75 or 100 mg daily depending on the blood pressure If sodium continues to run low, you may need to stop HCTZ  Use amlodipine extra pill if needed for high blood pressure  Please call us if you have new issues that need to be addressed before your next appt.  Your physician wants you to follow-up in: 6 months.  You will receive a reminder letter in the mail two months in advance. If you don't receive a letter, please call our office to schedule the follow-up appointment.

## 2011-11-02 NOTE — Assessment & Plan Note (Signed)
We have suggested she cut her amiloride HCTZ in half. She could increase the losartan to 100 mg daily for blood pressure. It is certainly possible the HCTZ is contributing to her low sodium. This could possibly be weaned off if this continues to be a problem.

## 2011-11-02 NOTE — Assessment & Plan Note (Signed)
We have encouraged continued exercise, careful diet management in an effort to lose weight. 

## 2011-11-02 NOTE — Assessment & Plan Note (Signed)
We have recommended exercise and weight loss for goal LDL less than 100 given she has diabetes.

## 2011-11-02 NOTE — Progress Notes (Signed)
Patient ID: Jessica Wheeler, female    DOB: [birth year], 71 y.o.   MRN: [id number]  HPI Comments: Jessica Wheeler is a very pleasant 71 year old woman with a history of diabetes, hypertension, smoking, mild coronary artery disease by cardiac catheterization in 2007 who presents for Routine followup.  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   She was recently seen by Dr. Kirke Corin for chest pain  Echocardiogram was performed that was normal  Stress test/Myoview was also normal showing no ischemia .  She believes her symptoms were secondary to an infection. She was treated with Levaquin, had fevers up to 103 at home. She is starting to feel better but does have cramping in her legs and upper chest. She reports her blood pressure has been well-controlled at home. Hemoglobin A1c 6.4  Recent lab work shows low sodium 129. She reports having a long history of low sodium level. She has been cutting back on her amlodipine as it does not make her feel well  EKG today shows normal sinus rhythm with rate 79 beats per minute, RBBB   Outpatient Encounter Prescriptions as of 11/02/2011  Medication Sig Dispense Refill  . amiloride-hydrochlorothiazide (MODURETIC) 5-50 MG tablet Take 1 tablet by mouth daily.        Marland Kitchen amLODipine (NORVASC) 2.5 MG tablet Take 2.5 mg by mouth daily.        Marland Kitchen aspirin 81 MG EC tablet Take 81 mg by mouth daily.        . Ibuprofen (ADVIL) 200 MG CAPS Take 1 capsule by mouth daily.        Marland Kitchen latanoprost (XALATAN) 0.005 % ophthalmic solution Place 1 drop into both eyes at bedtime.        Marland Kitchen losartan (COZAAR) 50 MG tablet Take 50 mg by mouth daily.        . magnesium oxide (MAG-OX) 400 MG tablet Take 400 mg by mouth daily.      . metFORMIN (GLUCOPHAGE) 500 MG tablet Take 1,000 mg by mouth 2 (two) times daily with a meal.        . vitamin B-12 (CYANOCOBALAMIN) 1000 MCG tablet Take  1,000 mcg by mouth daily.         Review of Systems  Constitutional: Positive for fatigue.       Muscle cramps  HENT: Negative.   Eyes: Negative.   Respiratory: Negative.   Cardiovascular: Negative.   Gastrointestinal: Negative.   Musculoskeletal: Negative.   Skin: Negative.   Neurological: Negative.   Hematological: Negative.   Psychiatric/Behavioral: Negative.   All other systems reviewed and are negative.    BP 152/68  Pulse 79  Ht 5' 3.5" (1.613 m)  Wt 145 lb 12 oz (66.112 kg)  BMI 25.41 kg/m2  Physical Exam  Nursing note and vitals reviewed. Constitutional: She is oriented to person, place, and time. She appears well-developed and well-nourished.  HENT:  Head: Normocephalic.  Nose: Nose normal.  Mouth/Throat: Oropharynx is clear and moist.  Eyes: Conjunctivae are normal. Pupils are equal, round, and reactive to light.  Neck: Normal range of motion. Neck supple. No JVD present.  Cardiovascular: Normal rate, regular rhythm, S1 normal, S2 normal, normal heart sounds and intact distal pulses.  Exam reveals no gallop and no friction rub.   No murmur heard. Pulmonary/Chest: Effort normal and breath sounds normal. No respiratory distress. She has no  wheezes. She has no rales. She exhibits no tenderness.  Abdominal: Soft. Bowel sounds are normal. She exhibits no distension. There is no tenderness.  Musculoskeletal: Normal range of motion. She exhibits no edema and no tenderness.  Lymphadenopathy:    She has no cervical adenopathy.  Neurological: She is alert and oriented to person, place, and time. Coordination normal.  Skin: Skin is warm and dry. No rash noted. No erythema.  Psychiatric: She has a normal mood and affect. Her behavior is normal. Judgment and thought content normal.         Assessment and Plan

## 2012-02-22 ENCOUNTER — Ambulatory Visit: Payer: Self-pay | Admitting: General Surgery

## 2012-03-26 ENCOUNTER — Other Ambulatory Visit: Payer: Self-pay | Admitting: Cardiology

## 2012-03-26 DIAGNOSIS — I7102 Dissection of abdominal aorta: Secondary | ICD-10-CM

## 2012-04-02 ENCOUNTER — Other Ambulatory Visit: Payer: Self-pay

## 2012-04-02 DIAGNOSIS — I2510 Atherosclerotic heart disease of native coronary artery without angina pectoris: Secondary | ICD-10-CM

## 2012-04-03 ENCOUNTER — Encounter (INDEPENDENT_AMBULATORY_CARE_PROVIDER_SITE_OTHER): Payer: Medicare Other

## 2012-04-03 DIAGNOSIS — I700 Atherosclerosis of aorta: Secondary | ICD-10-CM

## 2012-04-03 DIAGNOSIS — I7102 Dissection of abdominal aorta: Secondary | ICD-10-CM

## 2012-04-11 ENCOUNTER — Ambulatory Visit: Payer: Medicare Other | Admitting: Cardiovascular Disease

## 2012-04-16 ENCOUNTER — Ambulatory Visit: Payer: Self-pay | Admitting: General Surgery

## 2012-04-18 ENCOUNTER — Encounter: Payer: Self-pay | Admitting: Cardiovascular Disease

## 2012-04-18 ENCOUNTER — Ambulatory Visit (INDEPENDENT_AMBULATORY_CARE_PROVIDER_SITE_OTHER): Payer: Medicare Other | Admitting: Cardiovascular Disease

## 2012-04-18 VITALS — BP 140/60 | HR 73 | Ht 63.0 in | Wt 152.0 lb

## 2012-04-18 DIAGNOSIS — E785 Hyperlipidemia, unspecified: Secondary | ICD-10-CM

## 2012-04-18 DIAGNOSIS — E119 Type 2 diabetes mellitus without complications: Secondary | ICD-10-CM

## 2012-04-18 DIAGNOSIS — I7102 Dissection of abdominal aorta: Secondary | ICD-10-CM

## 2012-04-18 DIAGNOSIS — I10 Essential (primary) hypertension: Secondary | ICD-10-CM

## 2012-04-18 DIAGNOSIS — I2510 Atherosclerotic heart disease of native coronary artery without angina pectoris: Secondary | ICD-10-CM

## 2012-04-18 DIAGNOSIS — I701 Atherosclerosis of renal artery: Secondary | ICD-10-CM

## 2012-04-18 DIAGNOSIS — I7100 Dissection of unspecified site of aorta: Secondary | ICD-10-CM

## 2012-04-18 NOTE — Assessment & Plan Note (Signed)
Diabetes is relatively well controlled.

## 2012-04-18 NOTE — Progress Notes (Signed)
Patient ID: Jessica Wheeler, female    DOB: 02/19/1941, 71 y.o.   MRN: [id number]  HPI Comments: Jessica Wheeler is a very pleasant 71 year old woman with a history of diabetes, hypertension, smoking, mild coronary artery disease by cardiac catheterization in 2007 who presents for Routine followup. She reports history of aortic dissection following catheterization, also with renal artery stenosis followed by Dr. Wyn Quaker. Chronically low sodium.   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   Previous history of chest pain  chest pain  Echocardiogram was performed that was normal  Stress test/Myoview was also normal showing no ischemia . She believes her symptoms were secondary to an infection. She was treated with Levaquin, had fevers up to 103 at home. She is starting to feel better.   On her last clinic visit, and we have suggested she cut her diuretic in half and increase losartan dose to 100 mg daily for blood pressure control. She presents today and reports that she did not make any of these changes and is continued on her regular meds and feels well. Blood pressure has been relatively well controlled as well.   Lab work shows normal TSH, sodium 132, hemoglobin A1c 6.5  EKG today shows normal sinus rhythm with rate 73 beats per minute, RBBB   Outpatient Encounter Prescriptions as of 04/18/2012  Medication Sig Dispense Refill  . amiloride-hydrochlorothiazide (MODURETIC) 5-50 MG tablet Take 1 tablet by mouth daily.        Marland Kitchen amLODipine (NORVASC) 5 MG tablet Take 5 mg by mouth daily.      Marland Kitchen aspirin 81 MG EC tablet Take 81 mg by mouth daily.        . Ibuprofen (ADVIL) 200 MG CAPS Take 1 capsule by mouth daily.        Marland Kitchen latanoprost (XALATAN) 0.005 % ophthalmic solution Place 1 drop into both eyes at bedtime.        Marland Kitchen losartan (COZAAR) 50 MG tablet Take 50 mg by mouth daily.        .  magnesium oxide (MAG-OX) 400 MG tablet Take 400 mg by mouth daily.      . metFORMIN (GLUCOPHAGE) 500 MG tablet Take 1,000 mg by mouth 2 (two) times daily with a meal.        . vitamin B-12 (CYANOCOBALAMIN) 1000 MCG tablet Take 1,000 mcg by mouth daily.      . [DISCONTINUED] amLODipine (NORVASC) 2.5 MG tablet Take 2.5 mg by mouth daily.           Review of Systems  Constitutional: Positive for fatigue.  HENT: Negative.   Eyes: Negative.   Respiratory: Negative.   Cardiovascular: Negative.   Gastrointestinal: Negative.   Musculoskeletal: Negative.   Skin: Negative.   Neurological: Negative.   Hematological: Negative.   Psychiatric/Behavioral: Negative.   All other systems reviewed and are negative.    BP 140/60  Pulse 73  Ht 5\' 3"  (1.6 m)  Wt 152 lb (68.947 kg)  BMI 26.93 kg/m2  Physical Exam  Nursing note and vitals reviewed. Constitutional: She is oriented to person, place, and time. She appears well-developed and well-nourished.  HENT:  Head: Normocephalic.  Nose: Nose normal.  Mouth/Throat: Oropharynx is clear and moist.  Eyes: Conjunctivae normal are normal. Pupils are equal, round, and reactive to light.  Neck: Normal range of motion. Neck supple. No JVD present.  Cardiovascular: Normal rate, regular rhythm, S1 normal, S2 normal, normal heart sounds and intact distal pulses.  Exam reveals no gallop and no friction rub.   No murmur heard. Pulmonary/Chest: Effort normal and breath sounds normal. No respiratory distress. She has no wheezes. She has no rales. She exhibits no tenderness.  Abdominal: Soft. Bowel sounds are normal. She exhibits no distension. There is no tenderness.  Musculoskeletal: Normal range of motion. She exhibits no edema and no tenderness.  Lymphadenopathy:    She has no cervical adenopathy.  Neurological: She is alert and oriented to person, place, and time. Coordination normal.  Skin: Skin is warm and dry. No rash noted. No erythema.   Psychiatric: She has a normal mood and affect. Her behavior is normal. Judgment and thought content normal.         Assessment and Plan

## 2012-04-18 NOTE — Assessment & Plan Note (Signed)
Details are unavailable. We will try to obtain renal artery ultrasound procedure notes from Dr. Wyn Quaker. If this is atherosclerotic plaque, we would need to be more aggressive with her cholesterol

## 2012-04-18 NOTE — Assessment & Plan Note (Signed)
She reports total cholesterol in the 200 range. Not on a statin. Minimal coronary disease.

## 2012-04-18 NOTE — Patient Instructions (Addendum)
You are doing well. No medication changes were made.  We will schedule ABI's and renal artery ultrasound  Please call us if you have new issues that need to be addressed before your next appt.  Your physician wants you to follow-up in: 6 months.  You will receive a reminder letter in the mail two months in advance. If you don't receive a letter, please call our office to schedule the follow-up appointment.

## 2012-04-18 NOTE — Assessment & Plan Note (Signed)
No dissection seen on distal aorta ultrasound. We will try to obtain previous ultrasounds from Dr. Wyn Quaker. She is interested in having her  future ultrasounds done through our office

## 2012-04-24 ENCOUNTER — Encounter (INDEPENDENT_AMBULATORY_CARE_PROVIDER_SITE_OTHER): Payer: Medicare Other

## 2012-04-24 DIAGNOSIS — I10 Essential (primary) hypertension: Secondary | ICD-10-CM

## 2012-04-24 DIAGNOSIS — I701 Atherosclerosis of renal artery: Secondary | ICD-10-CM

## 2012-04-24 DIAGNOSIS — I700 Atherosclerosis of aorta: Secondary | ICD-10-CM

## 2012-04-24 DIAGNOSIS — I7100 Dissection of unspecified site of aorta: Secondary | ICD-10-CM

## 2012-05-30 ENCOUNTER — Ambulatory Visit: Payer: Self-pay | Admitting: Unknown Physician Specialty

## 2012-10-26 ENCOUNTER — Encounter: Payer: Self-pay | Admitting: *Deleted

## 2012-11-01 ENCOUNTER — Ambulatory Visit (INDEPENDENT_AMBULATORY_CARE_PROVIDER_SITE_OTHER): Payer: Medicare Other | Admitting: Cardiovascular Disease

## 2012-11-01 ENCOUNTER — Encounter: Payer: Self-pay | Admitting: Cardiovascular Disease

## 2012-11-01 VITALS — BP 152/74 | HR 77 | Ht 63.5 in | Wt 146.5 lb

## 2012-11-01 DIAGNOSIS — E119 Type 2 diabetes mellitus without complications: Secondary | ICD-10-CM

## 2012-11-01 DIAGNOSIS — N390 Urinary tract infection, site not specified: Secondary | ICD-10-CM | POA: Insufficient documentation

## 2012-11-01 DIAGNOSIS — I10 Essential (primary) hypertension: Secondary | ICD-10-CM

## 2012-11-01 DIAGNOSIS — E785 Hyperlipidemia, unspecified: Secondary | ICD-10-CM

## 2012-11-01 DIAGNOSIS — R079 Chest pain, unspecified: Secondary | ICD-10-CM

## 2012-11-01 DIAGNOSIS — R0789 Other chest pain: Secondary | ICD-10-CM

## 2012-11-01 DIAGNOSIS — I2510 Atherosclerotic heart disease of native coronary artery without angina pectoris: Secondary | ICD-10-CM

## 2012-11-01 DIAGNOSIS — R102 Pelvic and perineal pain: Secondary | ICD-10-CM

## 2012-11-01 MED ORDER — CIPROFLOXACIN HCL 500 MG PO TABS: 500.0000 mg | ORAL_TABLET | Freq: Two times a day (BID) | ORAL | Status: DC

## 2012-11-01 MED ORDER — NITROGLYCERIN 0.4 MG SL SUBL: 0.4000 mg | SUBLINGUAL_TABLET | SUBLINGUAL | Status: DC | PRN

## 2012-11-01 NOTE — Assessment & Plan Note (Addendum)
We have ordered a urinalysis and culture. Also called in prescription of ciprofloxacin 500 mg twice a day for 7 days. We have asked her to followup with Dr. Elease Hashimoto. We will send the results to Dr. Elease Hashimoto as soon as they become available.

## 2012-11-01 NOTE — Patient Instructions (Addendum)
We will order a UA and urine culture Start ciprofloxicin twice a day for 7 days  Hold the amiloride HCTZ for 1 to 2 weeks to see if cramps are better  If blood pressure runs high, increase the amlodipine to 10 mg a day  Please call us if you have new issues that need to be addressed before your next appt.  Your physician wants you to follow-up in: 6 months.  You will receive a reminder letter in the mail two months in advance. If you don't receive a letter, please call our office to schedule the follow-up appointment.

## 2012-11-01 NOTE — Assessment & Plan Note (Signed)
We have encouraged continued exercise, careful diet management in an effort to lose weight. 

## 2012-11-01 NOTE — Assessment & Plan Note (Signed)
Atypical chest pain. He presents at rest, not with heavy exertion. We talked about nitroglycerin for possible spasm. She's not interested in isosorbide. Other options including a trial of ranexa. Unable to exclude muscle spasms, possibly from HCTZ. She will hold her HCTZ for a two-week trial. If symptoms better and blood pressure higher, she could increase her amlodipine to 10 mg daily.

## 2012-11-01 NOTE — Assessment & Plan Note (Signed)
She's not currently on a statin. No recent lab work available.

## 2012-11-01 NOTE — Progress Notes (Signed)
Patient ID: Jessica Wheeler, female    DOB: 06/07/1940, 72 y.o.   MRN: [id number]  HPI Comments: Jessica Wheeler is a very pleasant 72 year old woman with a history of diabetes, hypertension, smoking, mild coronary artery disease by cardiac catheterization in 2007 who presents for Routine followup. She reports history of aortic dissection following catheterization, also with renal artery stenosis followed by Jessica Wheeler. Chronically low sodium.   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   She reports that she continues to have squeezing chest pain at rest. Denies having any symptoms with exertion. She is able to walk up steep inclines any regular exercise without any significant chest pain symptoms. She wonders if it could be muscle cramping or spasms. She also has some cramping in her legs at times.  Her biggest complaint today is symptoms of urinary tract infection. She has foul odor, discomfort with urination. She's had urinary tract infection before typically improved with ciprofloxacin, sometimes after fever develops has to take Levaquin. She was unable to get in to see Dr. Elease Hashimoto today for testing and evaluation. She's requesting urinalysis and antibiotic.  Previous  Echocardiogram was performed that was normal  Stress test/Myoview was also normal showing no ischemia .   Previous  Lab work shows normal TSH, sodium 132, hemoglobin A1c 6.5  EKG today shows normal sinus rhythm with rate 77 beats per minute, RBBB   Outpatient Encounter Prescriptions as of 11/01/2012  Medication Sig Dispense Refill  . amiloride-hydrochlorothiazide (MODURETIC) 5-50 MG tablet Take 1 tablet by mouth daily.        Marland Kitchen amLODipine (NORVASC) 5 MG tablet Take 5 mg by mouth daily.      Marland Kitchen aspirin 81 MG EC tablet Take 81 mg by mouth daily.        . bimatoprost (LUMIGAN) 0.01 % SOLN 1 drop at bedtime.      .  Ibuprofen (ADVIL) 200 MG CAPS Take 1 capsule by mouth daily.        Marland Kitchen losartan (COZAAR) 50 MG tablet Take 50 mg by mouth daily.        . magnesium oxide (MAG-OX) 400 MG tablet Take 400 mg by mouth daily.      . metFORMIN (GLUCOPHAGE) 500 MG tablet Take 1,000 mg by mouth 2 (two) times daily with a meal.        . vitamin B-12 (CYANOCOBALAMIN) 1000 MCG tablet Take 1,000 mcg by mouth daily.      . [DISCONTINUED] latanoprost (XALATAN) 0.005 % ophthalmic solution Place 1 drop into both eyes at bedtime.         No facility-administered encounter medications on file as of 11/01/2012.     Review of Systems  Constitutional: Positive for fatigue.  HENT: Negative.   Eyes: Negative.   Respiratory: Negative.   Cardiovascular: Positive for chest pain.  Gastrointestinal: Negative.   Genitourinary: Positive for dysuria and difficulty urinating.  Musculoskeletal: Negative.   Skin: Negative.   Neurological: Negative.   Psychiatric/Behavioral: Negative.   All other systems reviewed and are negative.    BP 152/74  Pulse 77  Ht 5' 3.5" (1.613 m)  Wt 146 lb 8 oz (66.452 kg)  BMI 25.54 kg/m2  Physical Exam  Nursing note and vitals reviewed. Constitutional: She is oriented to person, place, and time. She appears well-developed and well-nourished.  HENT:  Head: Normocephalic.  Nose: Nose  normal.  Mouth/Throat: Oropharynx is clear and moist.  Eyes: Conjunctivae are normal. Pupils are equal, round, and reactive to light.  Neck: Normal range of motion. Neck supple. No JVD present.  Cardiovascular: Normal rate, regular rhythm, S1 normal, S2 normal, normal heart sounds and intact distal pulses.  Exam reveals no gallop and no friction rub.   No murmur heard. Pulmonary/Chest: Effort normal and breath sounds normal. No respiratory distress. She has no wheezes. She has no rales. She exhibits no tenderness.  Abdominal: Soft. Bowel sounds are normal. She exhibits no distension. There is no tenderness.   Musculoskeletal: Normal range of motion. She exhibits no edema and no tenderness.  Lymphadenopathy:    She has no cervical adenopathy.  Neurological: She is alert and oriented to person, place, and time. Coordination normal.  Skin: Skin is warm and dry. No rash noted. No erythema.  Psychiatric: She has a normal mood and affect. Her behavior is normal. Judgment and thought content normal.    Assessment and Plan

## 2012-11-01 NOTE — Assessment & Plan Note (Signed)
She will hold her amiloride HCTZ to see if leg cramping and chest cramping gets better. His symptoms do improve, I asked her to watch her blood pressure. If high, we would increase the amlodipine to 10 mg daily. Could even increase losartan to 100 mg daily.

## 2012-11-02 LAB — URINALYSIS
Bilirubin, UA: NEGATIVE
Glucose, UA: NEGATIVE
Ketones, UA: NEGATIVE
Nitrite, UA: POSITIVE — AB
Protein, UA: NEGATIVE
Specific Gravity, UA: 1.017 (ref 1.005–1.030)
Urobilinogen, Ur: 0.2 mg/dL (ref 0.0–1.9)
pH, UA: 6.5 (ref 5.0–7.5)

## 2012-11-04 LAB — CULTURE, URINE COMPREHENSIVE

## 2012-12-05 ENCOUNTER — Other Ambulatory Visit: Payer: Self-pay

## 2013-03-07 ENCOUNTER — Other Ambulatory Visit: Payer: Self-pay

## 2013-04-17 ENCOUNTER — Ambulatory Visit: Payer: Self-pay | Admitting: General Surgery

## 2013-04-18 ENCOUNTER — Encounter: Payer: Self-pay | Admitting: General Surgery

## 2013-04-30 ENCOUNTER — Encounter: Payer: Self-pay | Admitting: *Deleted

## 2013-04-30 ENCOUNTER — Encounter: Payer: Self-pay | Admitting: General Surgery

## 2013-04-30 ENCOUNTER — Ambulatory Visit (INDEPENDENT_AMBULATORY_CARE_PROVIDER_SITE_OTHER): Payer: Medicare Other | Admitting: General Surgery

## 2013-04-30 VITALS — BP 130/82 | HR 72 | Resp 12 | Ht 63.5 in | Wt 148.0 lb

## 2013-04-30 DIAGNOSIS — N6019 Diffuse cystic mastopathy of unspecified breast: Secondary | ICD-10-CM | POA: Insufficient documentation

## 2013-04-30 DIAGNOSIS — Z1239 Encounter for other screening for malignant neoplasm of breast: Secondary | ICD-10-CM

## 2013-04-30 NOTE — Patient Instructions (Signed)
Continue self breast exams. Call office for any new breast issues or concerns. Follow up in one year with screening mammogram and office visit.

## 2013-04-30 NOTE — Progress Notes (Signed)
Patient ID: Jessica Wheeler, female   DOB: [birth year], 72 y.o.   MRN: [id number]  Chief Complaint  Patient presents with  . Follow-up    mammogram    HPI Jessica Wheeler is a 72 y.o. female.who presents for a breast evaluation. The most recent mammogram was done on 04/17/13 at [facility].  Patient does perform regular self breast checks and gets regular mammograms done.  No new breast issues.   HPI  Past Medical History  Diagnosis Date  . Diabetes mellitus   . Dissection, aorta     Dr.Dew follows  . History of bronchitis   . Glaucoma 2005  . Hypertension 1982  . Personal history of tobacco use, presenting hazards to health   . Ulcer   . History of pancreatitis 2005  . Arthritis   . Diverticulosis   . Family history of malignant neoplasm of gastrointestinal tract   . Diffuse cystic mastopathy     Past Surgical History  Procedure Laterality Date  . Back surgery  1982    lower surgery  . Cardiac catheterization      Dr. Lady Gary, few years ago  . Cholecystectomy  1994  . Abdominal hysterectomy  1991  . Colonoscopy  2008,02/22/2012    Dr. Jasai Sorg-2013  . Appendectomy    . Breast surgery      biopsy  . Bladder surgery      Family History  Problem Relation Age of Onset  . Diabetes Mother   . Hypertension Mother   . Hypertension Father   . Heart disease Father   . Colon cancer Brother   . Hypertension Maternal Grandmother   . Hypertension Maternal Grandfather     Social History History  Substance Use Topics  . Smoking status: Former Smoker -- 1.00 packs/day for 15 years    Types: Cigarettes    Quit date: 08/03/2003  . Smokeless tobacco: Never Used  . Alcohol Use: No    Allergies  Allergen Reactions  . Beta Adrenergic Blockers     Chest pressure/difficulty breathing  . Levaquin [Levofloxacin In D5w] Other (See Comments)    Low blood sugar    Current Outpatient Prescriptions  Medication Sig Dispense Refill  . amiloride-hydrochlorothiazide (MODURETIC) 5-50 MG  tablet Take 1 tablet by mouth daily.        Marland Kitchen amLODipine (NORVASC) 5 MG tablet Take 5 mg by mouth daily.      Marland Kitchen aspirin 81 MG EC tablet Take 81 mg by mouth daily.        . bimatoprost (LUMIGAN) 0.01 % SOLN 1 drop at bedtime.      . Ibuprofen (ADVIL) 200 MG CAPS Take 1 capsule by mouth daily.        Marland Kitchen losartan (COZAAR) 50 MG tablet Take 50 mg by mouth daily.        . magnesium oxide (MAG-OX) 400 MG tablet Take 400 mg by mouth daily.      . metFORMIN (GLUCOPHAGE) 500 MG tablet Take 1,000 mg by mouth 2 (two) times daily with a meal.        . nitroGLYCERIN (NITROSTAT) 0.4 MG SL tablet Place 1 tablet (0.4 mg total) under the tongue every 5 (five) minutes as needed for chest pain.  25 tablet  3  . vitamin B-12 (CYANOCOBALAMIN) 1000 MCG tablet Take 1,000 mcg by mouth daily.       No current facility-administered medications for this visit.    Review of Systems Review of Systems  Constitutional: Negative.  Respiratory: Negative.   Cardiovascular: Negative.   Musculoskeletal: Positive for back pain.    Blood pressure 130/82, pulse 72, resp. rate 12, height 5' 3.5" (1.613 m), weight 148 lb (67.132 kg).  Physical Exam Physical Exam  Constitutional: She is oriented to person, place, and time. She appears well-developed and well-nourished.  Eyes: Conjunctivae are normal. No scleral icterus.  Neck: Neck supple.  Cardiovascular: Normal rate, regular rhythm and normal heart sounds.   Pulmonary/Chest: Effort normal and breath sounds normal. Right breast exhibits no inverted nipple, no mass, no nipple discharge, no skin change and no tenderness. Left breast exhibits no inverted nipple, no mass, no nipple discharge, no skin change and no tenderness.  Lymphadenopathy:    She has no cervical adenopathy.    She has no axillary adenopathy.  Neurological: She is alert and oriented to person, place, and time.  Skin: Skin is warm and dry.    Data Reviewed Mammogram reviewed and stable.  Assessment     Stable physical exam.    Plan    Follow up in one year with screening mammogram and office visit.       Carlis Burnsworth G 04/30/2013, 2:36 PM

## 2013-05-06 ENCOUNTER — Ambulatory Visit (INDEPENDENT_AMBULATORY_CARE_PROVIDER_SITE_OTHER): Payer: Medicare Other | Admitting: Cardiovascular Disease

## 2013-05-06 ENCOUNTER — Encounter: Payer: Self-pay | Admitting: Cardiovascular Disease

## 2013-05-06 VITALS — BP 150/80 | HR 79 | Ht 63.5 in | Wt 141.0 lb

## 2013-05-06 DIAGNOSIS — E119 Type 2 diabetes mellitus without complications: Secondary | ICD-10-CM

## 2013-05-06 DIAGNOSIS — E785 Hyperlipidemia, unspecified: Secondary | ICD-10-CM

## 2013-05-06 DIAGNOSIS — I10 Essential (primary) hypertension: Secondary | ICD-10-CM

## 2013-05-06 DIAGNOSIS — I2510 Atherosclerotic heart disease of native coronary artery without angina pectoris: Secondary | ICD-10-CM

## 2013-05-06 DIAGNOSIS — I7102 Dissection of abdominal aorta: Secondary | ICD-10-CM

## 2013-05-06 NOTE — Assessment & Plan Note (Signed)
Total cholesterol 192. LDL 88. She does not want any other medications for cholesterol

## 2013-05-06 NOTE — Progress Notes (Signed)
Patient ID: Jessica Wheeler, female    DOB: 09-Jan-1941, 73 y.o.   MRN: [id number]  HPI Comments: [name] is a very pleasant 73 year old woman with a history of diabetes, hypertension, smoking, mild coronary artery disease by cardiac catheterization in 2007 who presents for routine followup. She reports history of aortic dissection following catheterization, also with renal artery stenosis followed by Dr. Wyn Quakerew. Chronically low sodium.   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   In followup today, she reports that she is doing well. She does have some stress at home as her son who is married for 25 years recently went through a divorce. She reports her hemoglobin A1c is in the low 6 range. Total cholesterol 192. Overall has no complaints She has had previous urinary tract infections, previous otitis media No recent problems  Previous  Echocardiogram was performed that was normal  Stress test/Myoview was also normal showing no ischemia .   Previous  Lab work shows normal TSH, sodium 132, hemoglobin A1c 6.5  EKG today shows normal sinus rhythm with rate79 beats per minute, RBBB   Outpatient Encounter Prescriptions as of 05/06/2013  Medication Sig  . amiloride-hydrochlorothiazide (MODURETIC) 5-50 MG tablet Take 1 tablet by mouth daily.    Marland Kitchen. amLODipine (NORVASC) 5 MG tablet Take 5 mg by mouth daily.  Marland Kitchen. aspirin 81 MG EC tablet Take 81 mg by mouth daily.    . bimatoprost (LUMIGAN) 0.01 % SOLN 1 drop at bedtime.  . Ibuprofen (ADVIL) 200 MG CAPS Take 1 capsule by mouth daily.    Marland Kitchen. losartan (COZAAR) 50 MG tablet Take 50 mg by mouth daily.    . magnesium oxide (MAG-OX) 400 MG tablet Take 400 mg by mouth daily.  . metFORMIN (GLUCOPHAGE) 500 MG tablet Take 1,000 mg by mouth 2 (two) times daily with a meal.    . nitroGLYCERIN (NITROSTAT) 0.4 MG SL tablet Place 1 tablet (0.4 mg total)  under the tongue every 5 (five) minutes as needed for chest pain.  . vitamin B-12 (CYANOCOBALAMIN) 1000 MCG tablet Take 1,000 mcg by mouth daily.     Review of Systems  Constitutional: Negative.   HENT: Negative.   Eyes: Negative.   Respiratory: Negative.   Cardiovascular: Negative.   Gastrointestinal: Negative.   Endocrine: Negative.   Musculoskeletal: Negative.   Skin: Negative.   Allergic/Immunologic: Negative.   Neurological: Negative.   Hematological: Negative.   Psychiatric/Behavioral: Negative.   All other systems reviewed and are negative.    BP 150/80  Pulse 79  Ht 5' 3.5" (1.613 m)  Wt 141 lb (63.957 kg)  BMI 24.58 kg/m2  Physical Exam  Nursing note and vitals reviewed. Constitutional: She is oriented to person, place, and time. She appears well-developed and well-nourished.  HENT:  Head: Normocephalic.  Nose: Nose normal.  Mouth/Throat: Oropharynx is clear and moist.  Eyes: Conjunctivae are normal. Pupils are equal, round, and reactive to light.  Neck: Normal range of motion. Neck supple. No JVD present.  Cardiovascular: Normal rate, regular rhythm, S1 normal, S2 normal, normal heart sounds and intact distal pulses.  Exam reveals no gallop and no friction rub.   No murmur heard. Pulmonary/Chest: Effort normal and breath sounds normal. No respiratory distress. She has no wheezes. She has no rales. She exhibits no tenderness.  Abdominal: Soft. Bowel sounds are normal. She exhibits no distension. There  is no tenderness.  Musculoskeletal: Normal range of motion. She exhibits no edema and no tenderness.  Lymphadenopathy:    She has no cervical adenopathy.  Neurological: She is alert and oriented to person, place, and time. Coordination normal.  Skin: Skin is warm and dry. No rash noted. No erythema.  Psychiatric: She has a normal mood and affect. Her behavior is normal. Judgment and thought content normal.    Assessment and Plan

## 2013-05-06 NOTE — Patient Instructions (Addendum)
You are doing well. No medication changes were made.  Please call us if you have new issues that need to be addressed before your next appt.  Your physician wants you to follow-up in: 12 months.  You will receive a reminder letter in the mail two months in advance. If you don't receive a letter, please call our office to schedule the follow-up appointment. 

## 2013-05-06 NOTE — Assessment & Plan Note (Signed)
Ultrasound last year showed no dissection

## 2013-05-06 NOTE — Assessment & Plan Note (Signed)
We have encouraged continued exercise, careful diet management in an effort to lose weight. 

## 2013-05-06 NOTE — Assessment & Plan Note (Signed)
Blood pressure is well controlled on today's visit. No changes made to the medications. 

## 2013-05-06 NOTE — Assessment & Plan Note (Signed)
Currently with no symptoms of angina. No further workup at this time. Continue current medication regimen. 

## 2013-10-17 ENCOUNTER — Telehealth: Payer: Self-pay

## 2013-10-17 NOTE — Telephone Encounter (Signed)
Spoke w/ pt.  She reports that she had a bad spring due to allergies, continues to feel like she has a band around her head. Reports that she saw Dr. Elease HashimotoMaloney 2 weeks ago and it was suggested that she has vertigo, but states that antivert is not helping. Reports dizziness when changing position. States that her diastolic pressures have been consistently in the 40s for some time now, but her PCP's nurse told her that was "perfect" at 118/46. Reports that she is staying hydrated, drinks lots of Powerade due to h/o low Na+. She has not taken her amlodipine or losartan today.  Please advise.  Thank you.

## 2013-10-17 NOTE — Telephone Encounter (Signed)
Pt called and states BP is 118/41 , pt states she lightheaded, and "feels like she has a band around her head", states she has had a lot of allergies this spring, but has not gotten any better, states she thinks it is getting worse. Please call.

## 2013-10-18 NOTE — Telephone Encounter (Signed)
Spoke w/ pt.  Advised her of Dr. Windell HummingbirdGollan's recommendation. She would like to remind Dr. Mariah MillingGollan that her BP issues are r/t previous kidney issues. Pt reports that she has foul smelling urine. Advised her to contact her PCP regarding this, as well as a possible referral to ENT.  She verbalizes understanding and is agreeable.  Pt to call w/ further questions or concerns.

## 2013-10-18 NOTE — Telephone Encounter (Signed)
If blood pressure continues to run low, she could hold her amlodipine If dizziness continues, she may need to see ENT for vertigo

## 2014-03-03 ENCOUNTER — Encounter: Payer: Self-pay | Admitting: Cardiovascular Disease

## 2014-04-18 ENCOUNTER — Ambulatory Visit: Payer: Self-pay | Admitting: General Surgery

## 2014-04-21 ENCOUNTER — Encounter: Payer: Self-pay | Admitting: General Surgery

## 2014-04-22 ENCOUNTER — Encounter: Payer: Self-pay | Admitting: General Surgery

## 2014-04-22 ENCOUNTER — Ambulatory Visit (INDEPENDENT_AMBULATORY_CARE_PROVIDER_SITE_OTHER): Payer: Medicare Other | Admitting: General Surgery

## 2014-04-22 VITALS — BP 152/62 | HR 80 | Resp 14 | Ht 63.5 in | Wt 141.0 lb

## 2014-04-22 DIAGNOSIS — N6019 Diffuse cystic mastopathy of unspecified breast: Secondary | ICD-10-CM

## 2014-04-22 DIAGNOSIS — I2510 Atherosclerotic heart disease of native coronary artery without angina pectoris: Secondary | ICD-10-CM

## 2014-04-22 NOTE — Patient Instructions (Signed)
Continue self breast exams. Call office for any new breast issues or concerns. 

## 2014-04-22 NOTE — Progress Notes (Signed)
Patient ID: Jessica Wheeler, female   DOB: 1941-02-25, 73 y.o.   MRN: [id number]  Chief Complaint  Patient presents with  . Follow-up    mammogram    HPI Jessica Wheeler is a 73 y.o. female who presents for a breast evaluation. The most recent mammogram was done on 04/18/14 Patient does perform regular self breast checks and gets regular mammograms done.  No new breast issues.  HPI  Past Medical History  Diagnosis Date  . Diabetes mellitus   . Dissection, aorta     Dr.Dew follows  . History of bronchitis   . Glaucoma 2005  . Hypertension 1982  . Personal history of tobacco use, presenting hazards to health   . Ulcer   . History of pancreatitis 2005  . Arthritis   . Diverticulosis   . Family history of malignant neoplasm of gastrointestinal tract   . Diffuse cystic mastopathy     Past Surgical History  Procedure Laterality Date  . Back surgery  1982    lower surgery  . Cardiac catheterization      Dr. Lady GaryFath, few years ago  . Cholecystectomy  1994  . Abdominal hysterectomy  1991  . Colonoscopy  2008,02/22/2012    Dr. Sankar-2013  . Appendectomy    . Breast surgery      biopsy  . Bladder surgery      Family History  Problem Relation Age of Onset  . Diabetes Mother   . Hypertension Mother   . Hypertension Father   . Heart disease Father   . Colon cancer Brother   . Hypertension Maternal Grandmother   . Hypertension Maternal Grandfather     Social History History  Substance Use Topics  . Smoking status: Former Smoker -- 1.00 packs/day for 15 years    Types: Cigarettes    Quit date: 08/03/2003  . Smokeless tobacco: Never Used  . Alcohol Use: No    Allergies  Allergen Reactions  . Beta Adrenergic Blockers     Chest pressure/difficulty breathing  . Levaquin [Levofloxacin In D5w] Other (See Comments)    Low blood sugar    Current Outpatient Prescriptions  Medication Sig Dispense Refill  . amiloride-hydrochlorothiazide (MODURETIC) 5-50 MG tablet Take 1  tablet by mouth daily.      Marland Kitchen. amLODipine (NORVASC) 5 MG tablet Take 5 mg by mouth daily.    Marland Kitchen. aspirin 81 MG EC tablet Take 81 mg by mouth daily.      . bimatoprost (LUMIGAN) 0.01 % SOLN 1 drop at bedtime.    . Ibuprofen (ADVIL) 200 MG CAPS Take 1 capsule by mouth daily.      Marland Kitchen. losartan (COZAAR) 50 MG tablet Take 50 mg by mouth daily.      . magnesium oxide (MAG-OX) 400 MG tablet Take 400 mg by mouth daily.    . metFORMIN (GLUCOPHAGE) 500 MG tablet Take 1,000 mg by mouth 2 (two) times daily with a meal.      . nitroGLYCERIN (NITROSTAT) 0.4 MG SL tablet Place 1 tablet (0.4 mg total) under the tongue every 5 (five) minutes as needed for chest pain. 25 tablet 3  . vitamin B-12 (CYANOCOBALAMIN) 1000 MCG tablet Take 1,000 mcg by mouth daily.     No current facility-administered medications for this visit.    Review of Systems Review of Systems  Constitutional: Negative.   Respiratory: Negative.   Cardiovascular: Negative.     Blood pressure 152/62, pulse 80, resp. rate 14, height 5' 3.5" (1.613  m), weight 141 lb (63.957 kg).  Physical Exam Physical Exam  Constitutional: She is oriented to person, place, and time. She appears well-developed and well-nourished.  Eyes: Conjunctivae are normal. No scleral icterus.  Neck: Neck supple.  Cardiovascular: Normal rate, regular rhythm and normal heart sounds.   Pulmonary/Chest: Effort normal and breath sounds normal. Right breast exhibits no inverted nipple, no mass, no nipple discharge, no skin change and no tenderness. Left breast exhibits no inverted nipple, no mass, no nipple discharge, no skin change and no tenderness.  Abdominal: Soft.  Lymphadenopathy:    She has no cervical adenopathy.    She has no axillary adenopathy.  Neurological: She is alert and oriented to person, place, and time.  Skin: Skin is warm and dry.    Data Reviewed Mammogram reviewed and stable.  Assessment    Stable exam. History of FCD. FH of colon cancer.      Plan    The patient has been asked to return to the office in one year with a bilateral screening mammogram. Last colonoscopy 2013-ok     PCP:  Darletta MollMaloney, Nancy  SANKAR,SEEPLAPUTHUR G 04/22/2014, 4:29 PM

## 2014-04-28 ENCOUNTER — Ambulatory Visit: Payer: PRIVATE HEALTH INSURANCE | Admitting: General Surgery

## 2014-06-24 ENCOUNTER — Ambulatory Visit (INDEPENDENT_AMBULATORY_CARE_PROVIDER_SITE_OTHER): Payer: Medicare Other | Admitting: Cardiovascular Disease

## 2014-06-24 ENCOUNTER — Encounter: Payer: Self-pay | Admitting: Cardiovascular Disease

## 2014-06-24 VITALS — BP 140/68 | HR 75 | Ht 63.0 in | Wt 142.5 lb

## 2014-06-24 DIAGNOSIS — R079 Chest pain, unspecified: Secondary | ICD-10-CM

## 2014-06-24 DIAGNOSIS — I10 Essential (primary) hypertension: Secondary | ICD-10-CM

## 2014-06-24 DIAGNOSIS — E1169 Type 2 diabetes mellitus with other specified complication: Secondary | ICD-10-CM

## 2014-06-24 DIAGNOSIS — I2510 Atherosclerotic heart disease of native coronary artery without angina pectoris: Secondary | ICD-10-CM

## 2014-06-24 DIAGNOSIS — M6281 Muscle weakness (generalized): Secondary | ICD-10-CM | POA: Insufficient documentation

## 2014-06-24 DIAGNOSIS — E785 Hyperlipidemia, unspecified: Secondary | ICD-10-CM

## 2014-06-24 NOTE — Assessment & Plan Note (Signed)
Currently not on a statin. Would not recommend statin given her muscle weakness

## 2014-06-24 NOTE — Assessment & Plan Note (Signed)
Atypical chest pain. Symptoms typically resolve with aspirin. She does have nitroglycerin but has not been taking this. Suggested she call our office if symptoms get worse. Previous catheterization with minimal coronary disease

## 2014-06-24 NOTE — Assessment & Plan Note (Signed)
Etiology of her muscle weakness in her hands is unclear also with joint pain. She attributes this to previous pneumonia shot and reaction. If symptoms persist, suggested she consider seeing rheumatology.

## 2014-06-24 NOTE — Patient Instructions (Addendum)
You are doing well. No medication changes were made.  Please call us if you have new issues that need to be addressed before your next appt.  Your physician wants you to follow-up in: 12 months.  You will receive a reminder letter in the mail two months in advance. If you don't receive a letter, please call our office to schedule the follow-up appointment. 

## 2014-06-24 NOTE — Progress Notes (Signed)
Patient ID: Jessica Wheeler, female    DOB: 1940-11-09, 74 y.o.   MRN: [id number]  HPI Comments: Jessica Wheeler is a very pleasant 74 year old woman with a history of diabetes, hypertension, smoking, mild coronary artery disease by cardiac catheterization in 2007 who presents for routine followup of her coronary artery disease. She reports history of aortic dissection following catheterization, also with renal artery stenosis followed by Dr. Wyn Quaker. Chronically low sodium.  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   In follow-up today, she denies any significant shortness of breath. She is periodically having chest pain relieved with aspirin. No significant pain on a regular basis with exertion. She reports having a severe reaction to pneumonia shot last year in 2015, still with hand weakness, joint problems. She had a rash initially that has resolved She also reports having shingles that comes and goes every several months  Blood pressure has been up and down but in general within a reasonable range, sugars also climbing slightly. Regular exercise program every day.  EKG on today's visit shows normal sinus rhythm with rate 75 beats minute, right bundle branch block  Other past medical history Previous  Echocardiogram was performed that was normal  Stress test/Myoview was also normal showing no ischemia .   Previous  Lab work shows normal TSH, sodium 132, hemoglobin A1c 6.5   Allergies  Allergen Reactions  . Beta Adrenergic Blockers     Chest pressure/difficulty breathing  . Levaquin [Levofloxacin In D5w] Other (See Comments)    Low blood sugar    Outpatient Encounter Prescriptions as of 06/24/2014  Medication Sig  . amiloride-hydrochlorothiazide (MODURETIC) 5-50 MG tablet Take 1/2 tablet daily.  Marland Kitchen amLODipine (NORVASC) 5 MG tablet Take 5 mg by mouth daily.  Marland Kitchen aspirin 81 MG EC  tablet Take 81 mg by mouth daily.    . bimatoprost (LUMIGAN) 0.01 % SOLN 1 drop at bedtime.  . Collagen-Boron-Hyaluronic Acid 10-5-3.3 MG TABS Take by mouth daily.  . Ibuprofen (ADVIL) 200 MG CAPS Take 1 capsule by mouth daily.    Marland Kitchen losartan (COZAAR) 50 MG tablet Take 50 mg by mouth daily.    . magnesium oxide (MAG-OX) 400 MG tablet Take 400 mg by mouth daily.  . metFORMIN (GLUCOPHAGE) 500 MG tablet Take 1,000 mg by mouth 2 (two) times daily with a meal.    . nitroGLYCERIN (NITROSTAT) 0.4 MG SL tablet Place 1 tablet (0.4 mg total) under the tongue every 5 (five) minutes as needed for chest pain.  . vitamin B-12 (CYANOCOBALAMIN) 1000 MCG tablet Take 1,000 mcg by mouth daily.    Past Medical History  Diagnosis Date  . Diabetes mellitus   . Dissection, aorta     Dr.Dew follows  . History of bronchitis   . Glaucoma 2005  . Hypertension 1982  . Personal history of tobacco use, presenting hazards to health   . Ulcer   . History of pancreatitis 2005  . Arthritis   . Diverticulosis   . Family history of malignant neoplasm of gastrointestinal tract   . Diffuse cystic mastopathy     Past Surgical History  Procedure Laterality Date  . Back surgery  1982    lower surgery  . Cardiac catheterization      Dr. Lady Gary, few years ago  . Cholecystectomy  1994  . Abdominal hysterectomy  1991  . Colonoscopy  2008,02/22/2012  Dr. Sankar-2013  . Appendectomy    . Breast surgery      biopsy  . Bladder surgery      Social History  reports that she quit smoking about 10 years ago. Her smoking use included Cigarettes. She has a 15 pack-year smoking history. She has never used smokeless tobacco. She reports that she does not drink alcohol or use illicit drugs.  Family History family history includes Colon cancer in her brother; Diabetes in her mother; Heart disease in her father; Hypertension in her father, maternal grandfather, maternal grandmother, and mother.   Review of Systems   Constitutional: Negative.   Respiratory: Negative.   Cardiovascular: Negative.   Gastrointestinal: Negative.   Musculoskeletal: Positive for myalgias and arthralgias.  Skin: Negative.   Neurological: Negative.   Hematological: Negative.   Psychiatric/Behavioral: Negative.   All other systems reviewed and are negative.   BP 140/68 mmHg  Pulse 75  Ht 5\' 3"  (1.6 m)  Wt 142 lb 8 oz (64.638 kg)  BMI 25.25 kg/m2  Physical Exam  Constitutional: She is oriented to person, place, and time. She appears well-developed and well-nourished.  HENT:  Head: Normocephalic.  Nose: Nose normal.  Mouth/Throat: Oropharynx is clear and moist.  Eyes: Conjunctivae are normal. Pupils are equal, round, and reactive to light.  Neck: Normal range of motion. Neck supple. No JVD present.  Cardiovascular: Normal rate, regular rhythm, S1 normal, S2 normal, normal heart sounds and intact distal pulses.  Exam reveals no gallop and no friction rub.   No murmur heard. Pulmonary/Chest: Effort normal and breath sounds normal. No respiratory distress. She has no wheezes. She has no rales. She exhibits no tenderness.  Abdominal: Soft. Bowel sounds are normal. She exhibits no distension. There is no tenderness.  Musculoskeletal: Normal range of motion. She exhibits no edema or tenderness.  Lymphadenopathy:    She has no cervical adenopathy.  Neurological: She is alert and oriented to person, place, and time. Coordination normal.  Skin: Skin is warm and dry. No rash noted. No erythema.  Psychiatric: She has a normal mood and affect. Her behavior is normal. Judgment and thought content normal.    Assessment and Plan  Nursing note and vitals reviewed.

## 2014-06-24 NOTE — Assessment & Plan Note (Signed)
Currently with no symptoms of angina. No further workup at this time. Continue current medication regimen. 

## 2014-06-24 NOTE — Assessment & Plan Note (Signed)
Blood pressure is well controlled on today's visit. No changes made to the medications. 

## 2014-06-24 NOTE — Assessment & Plan Note (Signed)
We have encouraged continued exercise, careful diet management  Hb A1C well controlled

## 2014-11-04 ENCOUNTER — Ambulatory Visit (INDEPENDENT_AMBULATORY_CARE_PROVIDER_SITE_OTHER): Payer: Medicare Other | Admitting: Family Medicine

## 2014-11-04 ENCOUNTER — Encounter: Payer: Self-pay | Admitting: Family Medicine

## 2014-11-04 VITALS — BP 124/62 | HR 81 | Temp 98.0°F | Resp 16 | Ht 63.0 in | Wt 142.0 lb

## 2014-11-04 DIAGNOSIS — F419 Anxiety disorder, unspecified: Secondary | ICD-10-CM | POA: Diagnosis not present

## 2014-11-04 DIAGNOSIS — B029 Zoster without complications: Secondary | ICD-10-CM | POA: Diagnosis not present

## 2014-11-04 DIAGNOSIS — I2510 Atherosclerotic heart disease of native coronary artery without angina pectoris: Secondary | ICD-10-CM | POA: Diagnosis not present

## 2014-11-04 MED ORDER — VALACYCLOVIR HCL 1 G PO TABS: 1000.0000 mg | ORAL_TABLET | Freq: Three times a day (TID) | ORAL | Status: DC

## 2014-11-04 NOTE — Progress Notes (Signed)
Subjective:    Patient ID: Jessica Wheeler, female    DOB: [birth year], 74 y.o.   MRN: [id number]  Rash This is a new problem. The current episode started 1 to 4 weeks ago. The problem has been gradually worsening since onset. The affected locations include the left hand. The rash is characterized by blistering and pain. She was exposed to nothing. Pertinent negatives include no congestion, cough, fever, joint pain, shortness of breath or sore throat. Past treatments include nothing.   Patient reports she is concerned that her rash is shingles. Patient reports she has had shingles twice before.    Also with a lot of anxiety. Has has anxiety worsening over past year over situation with her son who is living with her. She is supporting him mentally and financially. He does not respond to situations in the same way she would. Very frustrated and knows she can not say anything to him.     Review of Systems  Constitutional: Negative for fever.  HENT: Negative for congestion and sore throat.   Respiratory: Negative for cough and shortness of breath.   Musculoskeletal: Negative for joint pain.  Skin: Positive for rash.  Psychiatric/Behavioral: Positive for agitation. The patient is nervous/anxious.     Patient Active Problem List   Diagnosis Date Noted  . Muscle weakness 06/24/2014  . Fibrocystic breast disease 04/30/2013  . UTI (urinary tract infection) 11/01/2012  . Renal artery stenosis 04/18/2012  . Chest pain 10/10/2011  . Murmur 10/10/2011  . Aortic dissection, abdominal 03/31/2011  . HTN (hypertension) 08/03/2010  . CAD (coronary artery disease) 08/03/2010  . DM (diabetes mellitus) 08/03/2010  . Hyperlipidemia 08/03/2010   Past Medical History  Diagnosis Date  . Diabetes mellitus   . Dissection, aorta     Dr.Dew follows  . History of bronchitis   . Glaucoma 2005  . Hypertension 1982  . Personal history of tobacco use, presenting hazards to health   . Ulcer   . History of  pancreatitis 2005  . Arthritis   . Diverticulosis   . Family history of malignant neoplasm of gastrointestinal tract   . Diffuse cystic mastopathy    Current Outpatient Prescriptions on File Prior to Visit  Medication Sig  . amiloride-hydrochlorothiazide (MODURETIC) 5-50 MG tablet Take 1/2 tablet daily.  Marland Kitchen. amLODipine (NORVASC) 5 MG tablet Take 5 mg by mouth daily.  Marland Kitchen. aspirin 81 MG EC tablet Take 81 mg by mouth daily.    . bimatoprost (LUMIGAN) 0.01 % SOLN 1 drop at bedtime.  . Ibuprofen (ADVIL) 200 MG CAPS Take 1 capsule by mouth daily.    Marland Kitchen. losartan (COZAAR) 50 MG tablet Take 50 mg by mouth daily.    . magnesium oxide (MAG-OX) 400 MG tablet Take 400 mg by mouth daily.  . metFORMIN (GLUCOPHAGE) 500 MG tablet Take 1,000 mg by mouth 2 (two) times daily with a meal.    . nitroGLYCERIN (NITROSTAT) 0.4 MG SL tablet Place 1 tablet (0.4 mg total) under the tongue every 5 (five) minutes as needed for chest pain.  . vitamin B-12 (CYANOCOBALAMIN) 1000 MCG tablet Take 1,000 mcg by mouth daily.   No current facility-administered medications on file prior to visit.   Allergies  Allergen Reactions  . Beta Adrenergic Blockers     Chest pressure/difficulty breathing  . Levaquin [Levofloxacin In D5w] Other (See Comments)    Low blood sugar  . Lisinopril Cough  . Prevnar [Pneumococcal 13-Val Conj Vacc] Rash    Joint Pain  Past Surgical History  Procedure Laterality Date  . Back surgery  1982    lower surgery  . Cardiac catheterization      Dr. Lady Gary, few years ago  . Cholecystectomy  1994  . Abdominal hysterectomy  1991  . Colonoscopy  2008,02/22/2012    Dr. Sankar-2013  . Appendectomy    . Breast surgery      biopsy  . Bladder surgery     History   Social History  . Marital Status: Widowed    Spouse Name: N/A  . Number of Children: N/A  . Years of Education: N/A   Occupational History  . Not on file.   Social History Main Topics  . Smoking status: Former Smoker -- 1.00  packs/day for 15 years    Types: Cigarettes    Quit date: 08/03/2003  . Smokeless tobacco: Never Used  . Alcohol Use: No  . Drug Use: No  . Sexual Activity: Not on file   Other Topics Concern  . Not on file   Social History Narrative   Family History  Problem Relation Age of Onset  . Diabetes Mother   . Hypertension Mother   . Hypertension Father   . Heart disease Father   . Colon cancer Brother   . Hypertension Maternal Grandmother   . Hypertension Maternal Grandfather       Objective:   Physical Exam  Constitutional: She is oriented to person, place, and time.  Neurological: She is alert and oriented to person, place, and time.  Skin: Skin is warm and dry.  3 scabbed over blister on her left hand on thumb and back of hand.   Psychiatric: She has a normal mood and affect. Her behavior is normal. Judgment and thought content normal.    Blood pressure 124/62, pulse 81, temperature 98 F (36.7 C), temperature source Oral, resp. rate 16, height  (1.6 m), weight 142 lb (64.411 kg), SpO2 96 %.       Assessment & Plan:   1. Shingles Recurrent in new place. Will treat secondary to chronicity of problem and concern about post-herpetic neuralgia that her mom suffered from. Please call back if condition worsens or does not continue to improve.    - valACYclovir (VALTREX) 1000 MG tablet; Take 1 tablet (1,000 mg total) by mouth 3 (three) times daily.  Dispense: 21 tablet; Refill: 0  2. Anxiety  Worsening secondary to situation with her son. Can not change him and knows that trying to would cause strain between them. Will go to counselor. Wants to work on non-pharmacologic treatment. Spent most of over 30 minute visit on counseling.  Will look for counselor who can teach biofeedback or other coping strategies.  Please call back if condition worsens or does not continue to improve.      Lorie Phenix, MD

## 2014-12-02 ENCOUNTER — Encounter: Payer: Self-pay | Admitting: Family Medicine

## 2014-12-17 ENCOUNTER — Other Ambulatory Visit: Payer: Self-pay | Admitting: Family Medicine

## 2014-12-17 DIAGNOSIS — I10 Essential (primary) hypertension: Secondary | ICD-10-CM

## 2014-12-18 NOTE — Telephone Encounter (Signed)
This is a pt of Dr. Santiago Bur, Last ov was on 11/04/2014  Thanks,

## 2014-12-19 ENCOUNTER — Encounter: Payer: Self-pay | Admitting: Family Medicine

## 2015-01-25 ENCOUNTER — Other Ambulatory Visit: Payer: Self-pay | Admitting: Family Medicine

## 2015-01-25 DIAGNOSIS — I10 Essential (primary) hypertension: Secondary | ICD-10-CM

## 2015-01-28 ENCOUNTER — Other Ambulatory Visit: Payer: Self-pay

## 2015-01-28 DIAGNOSIS — I10 Essential (primary) hypertension: Secondary | ICD-10-CM

## 2015-01-28 MED ORDER — AMLODIPINE BESYLATE 5 MG PO TABS: 5.0000 mg | ORAL_TABLET | Freq: Every day | ORAL | Status: DC

## 2015-01-28 MED ORDER — LOSARTAN POTASSIUM 50 MG PO TABS: 50.0000 mg | ORAL_TABLET | Freq: Every day | ORAL | Status: DC

## 2015-01-31 DIAGNOSIS — J309 Allergic rhinitis, unspecified: Secondary | ICD-10-CM | POA: Insufficient documentation

## 2015-01-31 DIAGNOSIS — I2510 Atherosclerotic heart disease of native coronary artery without angina pectoris: Secondary | ICD-10-CM | POA: Insufficient documentation

## 2015-01-31 DIAGNOSIS — E119 Type 2 diabetes mellitus without complications: Secondary | ICD-10-CM | POA: Insufficient documentation

## 2015-01-31 DIAGNOSIS — H409 Unspecified glaucoma: Secondary | ICD-10-CM | POA: Insufficient documentation

## 2015-01-31 DIAGNOSIS — N816 Rectocele: Secondary | ICD-10-CM | POA: Insufficient documentation

## 2015-01-31 DIAGNOSIS — I10 Essential (primary) hypertension: Secondary | ICD-10-CM | POA: Insufficient documentation

## 2015-01-31 DIAGNOSIS — I7100 Dissection of unspecified site of aorta: Secondary | ICD-10-CM | POA: Insufficient documentation

## 2015-01-31 DIAGNOSIS — N3020 Other chronic cystitis without hematuria: Secondary | ICD-10-CM | POA: Insufficient documentation

## 2015-01-31 DIAGNOSIS — L659 Nonscarring hair loss, unspecified: Secondary | ICD-10-CM | POA: Insufficient documentation

## 2015-01-31 DIAGNOSIS — E871 Hypo-osmolality and hyponatremia: Secondary | ICD-10-CM | POA: Insufficient documentation

## 2015-01-31 DIAGNOSIS — H6980 Other specified disorders of Eustachian tube, unspecified ear: Secondary | ICD-10-CM | POA: Insufficient documentation

## 2015-01-31 DIAGNOSIS — R5383 Other fatigue: Secondary | ICD-10-CM

## 2015-01-31 DIAGNOSIS — H6990 Unspecified Eustachian tube disorder, unspecified ear: Secondary | ICD-10-CM | POA: Insufficient documentation

## 2015-01-31 DIAGNOSIS — M62838 Other muscle spasm: Secondary | ICD-10-CM | POA: Insufficient documentation

## 2015-01-31 DIAGNOSIS — H8110 Benign paroxysmal vertigo, unspecified ear: Secondary | ICD-10-CM | POA: Insufficient documentation

## 2015-01-31 DIAGNOSIS — R5381 Other malaise: Secondary | ICD-10-CM | POA: Insufficient documentation

## 2015-02-13 ENCOUNTER — Other Ambulatory Visit: Payer: Self-pay

## 2015-02-13 ENCOUNTER — Encounter: Payer: Self-pay | Admitting: Family Medicine

## 2015-02-13 ENCOUNTER — Ambulatory Visit (INDEPENDENT_AMBULATORY_CARE_PROVIDER_SITE_OTHER): Payer: Medicare Other | Admitting: Family Medicine

## 2015-02-13 VITALS — BP 126/62 | HR 80 | Temp 97.5°F | Resp 16 | Wt 142.0 lb

## 2015-02-13 DIAGNOSIS — N3090 Cystitis, unspecified without hematuria: Secondary | ICD-10-CM | POA: Diagnosis not present

## 2015-02-13 DIAGNOSIS — S90121A Contusion of right lesser toe(s) without damage to nail, initial encounter: Secondary | ICD-10-CM | POA: Diagnosis not present

## 2015-02-13 DIAGNOSIS — E1169 Type 2 diabetes mellitus with other specified complication: Secondary | ICD-10-CM

## 2015-02-13 DIAGNOSIS — I2510 Atherosclerotic heart disease of native coronary artery without angina pectoris: Secondary | ICD-10-CM | POA: Diagnosis not present

## 2015-02-13 LAB — POCT URINALYSIS DIPSTICK
Bilirubin, UA: NEGATIVE
Glucose, UA: NEGATIVE
Ketones, UA: NEGATIVE
Nitrite, UA: POSITIVE
Protein, UA: NEGATIVE
Spec Grav, UA: 1.01
Urobilinogen, UA: 0.2
pH, UA: 8

## 2015-02-13 MED ORDER — CIPROFLOXACIN HCL 250 MG PO TABS: 250.0000 mg | ORAL_TABLET | Freq: Two times a day (BID) | ORAL | Status: DC
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
## 2015-02-16 ENCOUNTER — Telehealth: Payer: Self-pay

## 2015-02-16 DIAGNOSIS — N3090 Cystitis, unspecified without hematuria: Secondary | ICD-10-CM

## 2015-02-16 LAB — URINE CULTURE

## 2015-02-16 NOTE — Telephone Encounter (Signed)
LMTCB 02/16/2015  Thanks,   -Ansleigh Safer  

## 2015-02-16 NOTE — Telephone Encounter (Signed)
Notes Recorded by Lorie PhenixNancy Maloney, MD on 02/16/2015 at 11:44 AM Does have an infection.  Treated with appropriate antibiotic. Please see how patient is doing. Thanks

## 2015-02-16 NOTE — Telephone Encounter (Signed)
-----   Message from Lorie PhenixNancy Maloney, MD sent at 02/16/2015 11:44 AM EDT ----- See below

## 2015-02-21 ENCOUNTER — Other Ambulatory Visit: Payer: Self-pay | Admitting: Family Medicine

## 2015-02-23 ENCOUNTER — Other Ambulatory Visit: Payer: Self-pay

## 2015-02-23 DIAGNOSIS — Z1231 Encounter for screening mammogram for malignant neoplasm of breast: Secondary | ICD-10-CM

## 2015-02-23 MED ORDER — CIPROFLOXACIN HCL 250 MG PO TABS: 250.0000 mg | ORAL_TABLET | Freq: Two times a day (BID) | ORAL | Status: DC

## 2015-02-23 NOTE — Telephone Encounter (Signed)
[email or web address] 

## 2015-02-23 NOTE — Telephone Encounter (Signed)
Pt advised, she has been at the beach, she states she is burning with urination, a little diarrhea, pressure, frequency. No fever, no blood. She did finish antibiotic that was prescribed. Patient states she usually gets a longer period of antibiotic use but this time it was for 5 days. Please advise.-aa

## 2015-02-23 NOTE — Telephone Encounter (Signed)
Can send another 5 days if patient would like, but new recommendations are for shorter course. Thanks.

## 2015-02-23 NOTE — Telephone Encounter (Signed)
Pt advsed and RX sent in-aa

## 2015-03-04 ENCOUNTER — Ambulatory Visit
Admission: RE | Admit: 2015-03-04 | Discharge: 2015-03-04 | Disposition: A | Discharge status: Home / Self Care | Payer: Medicare Other | Source: Ambulatory Visit | Attending: Family Medicine | Admitting: Family Medicine

## 2015-03-04 ENCOUNTER — Ambulatory Visit (INDEPENDENT_AMBULATORY_CARE_PROVIDER_SITE_OTHER): Payer: Medicare Other | Admitting: Family Medicine

## 2015-03-04 ENCOUNTER — Encounter: Payer: Self-pay | Admitting: Family Medicine

## 2015-03-04 VITALS — BP 136/56 | HR 92 | Temp 98.2°F | Resp 16 | Wt 140.0 lb

## 2015-03-04 DIAGNOSIS — J189 Pneumonia, unspecified organism: Secondary | ICD-10-CM

## 2015-03-04 DIAGNOSIS — R05 Cough: Secondary | ICD-10-CM | POA: Diagnosis present

## 2015-03-04 DIAGNOSIS — I2510 Atherosclerotic heart disease of native coronary artery without angina pectoris: Secondary | ICD-10-CM

## 2015-03-04 DIAGNOSIS — R058 Other specified cough: Secondary | ICD-10-CM

## 2015-03-04 LAB — POCT INFLUENZA A/B
Influenza A, POC: NEGATIVE
Influenza B, POC: NEGATIVE

## 2015-03-04 MED ORDER — HYDROCODONE-HOMATROPINE 5-1.5 MG/5ML PO SYRP: 5.0000 mL | ORAL_SOLUTION | Freq: Three times a day (TID) | ORAL | Status: DC | PRN

## 2015-03-04 MED ORDER — LEVOFLOXACIN 500 MG PO TABS: 500.0000 mg | ORAL_TABLET | Freq: Every day | ORAL | Status: DC

## 2015-03-04 NOTE — Progress Notes (Signed)
Subjective:    Patient ID: Jessica Wheeler, female    DOB: [birth year], 74 y.o.   MRN: [id number]  Cough This is a new problem. The current episode started in the past 7 days. The problem has been gradually improving. The cough is productive of sputum (Is blood tinged. ). Associated symptoms include chest pain, chills, ear congestion, ear pain, a fever (up to 102 this morning), headaches, myalgias, nasal congestion, postnasal drip, rhinorrhea, shortness of breath, sweats and wheezing. Pertinent negatives include no rash, sore throat or weight loss. Hemoptysis: possibly, sputum looked blood-tinged. Treatments tried: Advil, Tylenol, pt was on Cipro for UTI.  Has not helped.  The treatment provided no relief. Her past medical history is significant for bronchitis, environmental allergies and pneumonia. There is no history of asthma or COPD.   Has had history of pneumonia as noted.  Concerned about getting it again.   Would also like a cough medication. Has had flu shot.  Patient does have diabetes.     Review of Systems  Constitutional: Positive for fever (up to 102 this morning) and chills. Negative for weight loss.  HENT: Positive for ear pain, postnasal drip and rhinorrhea. Negative for sore throat.   Respiratory: Positive for cough, shortness of breath and wheezing. Hemoptysis: possibly, sputum looked blood-tinged.   Cardiovascular: Positive for chest pain.  Musculoskeletal: Positive for myalgias.  Skin: Negative for rash.  Allergic/Immunologic: Positive for environmental allergies.  Neurological: Positive for headaches.   BP 136/56 mmHg  Pulse 92  Temp(Src) 98.2 F (36.8 C) (Oral)  Resp 16  Wt 140 lb (63.504 kg)  SpO2 94%   Patient Active Problem List   Diagnosis Date Noted  . Allergic rhinitis 01/31/2015  . Aortic dissection (HCC) 01/31/2015  . Benign paroxysmal positional nystagmus 01/31/2015  . Bladder infection, chronic 01/31/2015  . Atherosclerosis of coronary artery  01/31/2015  . Dysfunction of eustachian tube 01/31/2015  . Glaucoma 01/31/2015  . Alopecia 01/31/2015  . BP (high blood pressure) 01/31/2015  . Below normal amount of sodium in the blood 01/31/2015  . Malaise and fatigue 01/31/2015  . Cramp in muscle 01/31/2015  . 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 Shingles 11/04/2014  . Anxiety 11/04/2014  . Muscle weakness 06/24/2014  . Fibrocystic breast disease 04/30/2013  . 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 UTI (urinary tract infection) 11/01/2012  . Renal artery stenosis (HCC) 04/18/2012  . Chest pain 10/10/2011  . Murmur 10/10/2011  . Aortic dissection, abdominal (HCC) 03/31/2011  . 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HCC) 03/31/2011  . HTN (hypertension) 08/03/2010  . CAD (coronary artery disease) 08/03/2010  . DM (diabetes mellitus) (HCC) 08/03/2010  . Hyperlipidemia 08/03/2010   Past Medical History  Diagnosis Date  . Diabetes mellitus   . Dissection, aorta (HCC)     Dr.Dew follows  . History of bronchitis   . Glaucoma 2005  . Hypertension 1982  . Personal history of tobacco use, presenting hazards to health   . Ulcer   . History of pancreatitis 2005  . Arthritis   . Diverticulosis   . Family history of malignant neoplasm of gastrointestinal tract   . Diffuse cystic mastopathy    Current Outpatient Prescriptions on File Prior to Visit  Medication Sig  . amiloride-hydrochlorothiazide (MODURETIC) 5-50 MG tablet TAKE 1 TABLET BY MOUTH EVERY DAY  . amLODipine (NORVASC) 5 MG tablet Take 1 tablet (5 mg total) by mouth daily.  Marland Kitchen. aspirin 81 MG EC tablet Take 81 mg by mouth  daily.    . clobetasol ointment (TEMOVATE) 0.05 % Apply twice daily to involved areas of hands  . Ibuprofen (ADVIL) 200 MG CAPS Take 1 capsule by mouth daily.    Marland Kitchen latanoprost (XALATAN) 0.005 % ophthalmic solution INT 1 GTT IN OU HS  . losartan (COZAAR) 50 MG tablet Take 1 tablet (50 mg total) by mouth daily.  . magnesium oxide (MAG-OX) 400 MG  tablet Take 400 mg by mouth daily.  . metFORMIN (GLUCOPHAGE) 500 MG tablet Take 1,000 mg by mouth 2 (two) times daily with a meal.    . nitroGLYCERIN (NITROSTAT) 0.4 MG SL tablet Place 1 tablet (0.4 mg total) under the tongue every 5 (five) minutes as needed for chest pain.  Marland Kitchen triamcinolone ointment (KENALOG) 0.1 % Apply twice daily as needed to left thigh  . valACYclovir (VALTREX) 1000 MG tablet Take 1 tablet (1,000 mg total) by mouth 3 (three) times daily.  . vitamin B-12 (CYANOCOBALAMIN) 1000 MCG tablet Take 1,000 mcg by mouth daily.   No current facility-administered medications on file prior to visit.   Allergies  Allergen Reactions  . Beta Adrenergic Blockers     Chest pressure/difficulty breathing  . Levaquin [Levofloxacin In D5w] Other (See Comments)    Low blood sugar  . Lisinopril Cough  . Prevnar [Pneumococcal 13-Val Conj Vacc] Rash    Joint Pain   Past Surgical History  Procedure Laterality Date  . Back surgery  1982    lower surgery  . Cardiac catheterization      Dr. Lady Gary, few years ago  . Cholecystectomy  1994  . Abdominal hysterectomy  1991  . Colonoscopy  2008,02/22/2012    Dr. Sankar-2013  . Appendectomy    . Breast surgery      biopsy  . Bladder surgery     Social History   Social History  . Marital Status: Widowed    Spouse Name: N/A  . Number of Children: 2  . Years of Education: College   Occupational History  . Retired    Social History Main Topics  . Smoking status: Former Smoker -- 1.00 packs/day for 15 years    Types: Cigarettes    Quit date: 08/03/2003  . Smokeless tobacco: Never Used  . Alcohol Use: No  . Drug Use: No  . Sexual Activity: Not on file   Other Topics Concern  . Not on file   Social History Narrative   Family History  Problem Relation Age of Onset  . Diabetes Mother   . Hypertension Mother   . Anxiety disorder Mother   . Hypertension Father   . Heart disease Father   . Heart attack Father   . Colon cancer  Brother   . Hypertension Maternal Grandmother   . Hypertension Maternal Grandfather   . Breast cancer Maternal Aunt       Objective:   Physical Exam  Constitutional: She is oriented to person, place, and time. She appears well-developed and well-nourished.  HENT:  Head: Normocephalic and atraumatic.  Right Ear: External ear normal.  Left Ear: External ear normal.  Mouth/Throat: Oropharynx is clear and moist.  Eyes: Conjunctivae and EOM are normal. Pupils are equal, round, and reactive to light.  Neck: Normal range of motion. Neck supple.  Cardiovascular: Normal rate and regular rhythm.   Pulmonary/Chest: Effort normal and breath sounds normal.  Neurological: She is alert and oriented to person, place, and time.  Psychiatric: She has a normal mood and affect. Her behavior is normal.  Judgment and thought content normal.   BP 136/56 mmHg  Pulse 92  Temp(Src) 98.2 F (36.8 C) (Oral)  Resp 16  Wt 140 lb (63.504 kg)  SpO2 94%        Assessment & Plan:  1. Cough productive of purulent sputum New problem. Flu negative.  - POCT Influenza A/B - HYDROcodone-homatropine (HYCODAN) 5-1.5 MG/5ML syrup; Take 5 mLs by mouth every 8 (eight) hours as needed for cough.  Dispense: 120 mL; Refill: 0  2. Pneumonia, unspecified laterality, unspecified part of lung Suspected diagnosis in light of fever and course of illness. Will treat with Levaquin, per patient request. Does not have allergy but sensitivity. Will watch her sugars closely.  Will also check CXR. Patient instructed to call back if condition worsens or does not improve.  Patient does have diabetes. - levofloxacin (LEVAQUIN) 500 MG tablet; Take 1 tablet (500 mg total) by mouth daily.  Dispense: 7 tablet; Refill: 0 - DG Chest 2 View; Future  Lorie Phenix, MD

## 2015-04-16 ENCOUNTER — Other Ambulatory Visit: Payer: Self-pay | Admitting: Family Medicine

## 2015-04-17 ENCOUNTER — Telehealth: Payer: Self-pay | Admitting: Family Medicine

## 2015-04-17 DIAGNOSIS — I10 Essential (primary) hypertension: Secondary | ICD-10-CM

## 2015-04-17 MED ORDER — AMILORIDE-HYDROCHLOROTHIAZIDE 5-50 MG PO TABS: 1.0000 | ORAL_TABLET | Freq: Every day | ORAL | Status: DC

## 2015-04-17 NOTE — Telephone Encounter (Signed)
She went to get her refill for amiloride-hydrochlorothiazide (MODURETIC) 5-50 MG tablet And they told her the insurance said she needed to contact her primary care doctor before they can refill  Her call back is [phone] and if she does not answer please leave a message.  Thanks, Fortune Brandsteri

## 2015-04-20 ENCOUNTER — Ambulatory Visit: Payer: PRIVATE HEALTH INSURANCE

## 2015-04-21 ENCOUNTER — Other Ambulatory Visit: Payer: Self-pay | Admitting: General Surgery

## 2015-04-21 ENCOUNTER — Ambulatory Visit
Admission: RE | Admit: 2015-04-21 | Discharge: 2015-04-21 | Disposition: A | Discharge status: Home / Self Care | Payer: Medicare Other | Source: Ambulatory Visit | Attending: General Surgery | Admitting: General Surgery

## 2015-04-21 DIAGNOSIS — Z1231 Encounter for screening mammogram for malignant neoplasm of breast: Secondary | ICD-10-CM | POA: Diagnosis not present

## 2015-04-28 ENCOUNTER — Ambulatory Visit (INDEPENDENT_AMBULATORY_CARE_PROVIDER_SITE_OTHER): Payer: Medicare Other | Admitting: General Surgery

## 2015-04-28 ENCOUNTER — Encounter: Payer: Self-pay | Admitting: General Surgery

## 2015-04-28 VITALS — BP 124/72 | HR 68 | Resp 12 | Ht 63.5 in | Wt 141.0 lb

## 2015-04-28 DIAGNOSIS — N6019 Diffuse cystic mastopathy of unspecified breast: Secondary | ICD-10-CM

## 2015-04-28 DIAGNOSIS — I2510 Atherosclerotic heart disease of native coronary artery without angina pectoris: Secondary | ICD-10-CM | POA: Diagnosis not present

## 2015-04-28 DIAGNOSIS — Z800 Family history of malignant neoplasm of digestive organs: Secondary | ICD-10-CM

## 2015-04-28 NOTE — Patient Instructions (Addendum)
The patient is aware to call back for any questions or concerns.  The patient has been asked to return to the office in one year with a bilateral screening mammogram 

## 2015-04-28 NOTE — Progress Notes (Signed)
Patient ID: Jessica Wheeler Grace, female   DOB: 07/26/1940, 74 y.o.   MRN: [id number]  Chief Complaint  Patient presents with  . Follow-up    mammogram    HPI Jessica Wheeler Madariaga is a 74 y.o. female.  who presents for a breast evaluation. The most recent mammogram was done on 04-20-15.  Patient does perform regular self breast checks and gets regular mammograms done.   No new breast issues. I have reviewed the history of present illness with the patient.  HPI  Past Medical History  Diagnosis Date  . Diabetes mellitus   . Dissection, aorta (HCC)     Dr.Dew follows  . History of bronchitis   . Glaucoma 2005  . Hypertension 1982  . Personal history of tobacco use, presenting hazards to health   . Ulcer   . History of pancreatitis 2005  . Arthritis   . Diverticulosis   . Family history of malignant neoplasm of gastrointestinal tract   . Diffuse cystic mastopathy     Past Surgical History  Procedure Laterality Date  . Back surgery  1982    lower surgery  . Cardiac catheterization      Dr. Lady GaryFath, few years ago  . Cholecystectomy  1994  . Abdominal hysterectomy  1991  . Colonoscopy  2008,02/22/2012    Dr. Sankar-2013  . Appendectomy    . Breast surgery      biopsy  . Bladder surgery    . Breast biopsy Right 1980's    Family History  Problem Relation Age of Onset  . Diabetes Mother   . Hypertension Mother   . Anxiety disorder Mother   . Hypertension Father   . Heart disease Father   . Heart attack Father   . Colon cancer Brother   . Hypertension Maternal Grandmother   . Hypertension Maternal Grandfather   . Breast cancer Maternal Aunt 60  . Breast cancer Cousin [id number]    Social History Social History  Substance Use Topics  . Smoking status: Former Smoker -- 1.00 packs/day for 15 years    Types: Cigarettes    Quit date: 08/03/2003  . Smokeless tobacco: Never Used  . Alcohol Use: No    Allergies  Allergen Reactions  . Beta Adrenergic Blockers     Chest  pressure/difficulty breathing  . Levaquin [Levofloxacin In D5w]     Low blood sugar  . Lisinopril Cough  . Prevnar [Pneumococcal 13-Val Conj Vacc] Rash    Joint Pain    Current Outpatient Prescriptions  Medication Sig Dispense Refill  . amiloride-hydrochlorothiazide (MODURETIC) 5-50 MG tablet Take 1 tablet by mouth daily. 90 tablet 1  . amLODipine (NORVASC) 5 MG tablet Take 1 tablet (5 mg total) by mouth daily. 90 tablet 3  . aspirin 81 MG EC tablet Take 81 mg by mouth daily.      . clobetasol ointment (TEMOVATE) 0.05 % Apply twice daily to involved areas of hands as needed    . Ibuprofen (ADVIL) 200 MG CAPS Take 1 capsule by mouth daily.      Marland Kitchen. latanoprost (XALATAN) 0.005 % ophthalmic solution INT 1 GTT IN EACH EYE HS  3  . losartan (COZAAR) 50 MG tablet Take 1 tablet (50 mg total) by mouth daily. 90 tablet 3  . metFORMIN (GLUCOPHAGE) 500 MG tablet Take 1,000 mg by mouth 2 (two) times daily with a meal.      . nitroGLYCERIN (NITROSTAT) 0.4 MG SL tablet Place 1 tablet (0.4 mg total) under the  tongue every 5 (five) minutes as needed for chest pain. 25 tablet 3  . vitamin B-12 (CYANOCOBALAMIN) 1000 MCG tablet Take 1,000 mcg by mouth daily.     No current facility-administered medications for this visit.    Review of Systems Review of Systems  Constitutional: Negative.   Respiratory: Negative.   Cardiovascular: Negative.     Blood pressure 124/72, pulse 68, resp. rate 12, height 5' 3.5" (1.613 m), weight 141 lb (63.957 kg).  Physical Exam Physical Exam  Constitutional: She is oriented to person, place, and time. She appears well-developed and well-nourished.  HENT:  Mouth/Throat: Oropharynx is clear and moist.  Eyes: Conjunctivae are normal. No scleral icterus.  Neck: Neck supple.  Cardiovascular: Normal rate, regular rhythm and normal heart sounds.   Pulmonary/Chest: Effort normal and breath sounds normal. Right breast exhibits no inverted nipple, no mass, no nipple discharge,  no skin change and no tenderness. Left breast exhibits no inverted nipple, no mass, no nipple discharge, no skin change and no tenderness.  Abdominal: Soft. Normal appearance. There is no hepatomegaly. There is no tenderness.  Lymphadenopathy:    She has no cervical adenopathy.    She has no axillary adenopathy.  Neurological: She is alert and oriented to person, place, and time.  Skin: Skin is warm and dry.  Psychiatric: Her behavior is normal.    Data Reviewed Mammogram reviewed and stable.  Assessment    Stable physical exam. History of fibrocystic disease. Family history of colon cancer    Plan    The patient has been asked to return to the office in one year with a bilateral screening mammogram. Last colonoscopy 2013.         PCP:  Lorie Phenix This information has been scribed by Dorathy Daft RNBC.  SANKAR,SEEPLAPUTHUR G 04/28/2015, 2:07 PM

## 2015-05-04 DIAGNOSIS — M546 Pain in thoracic spine: Secondary | ICD-10-CM | POA: Diagnosis not present

## 2015-05-04 DIAGNOSIS — M9902 Segmental and somatic dysfunction of thoracic region: Secondary | ICD-10-CM | POA: Diagnosis not present

## 2015-05-04 DIAGNOSIS — M542 Cervicalgia: Secondary | ICD-10-CM | POA: Diagnosis not present

## 2015-05-04 DIAGNOSIS — M9901 Segmental and somatic dysfunction of cervical region: Secondary | ICD-10-CM | POA: Diagnosis not present

## 2015-05-04 DIAGNOSIS — M545 Low back pain: Secondary | ICD-10-CM | POA: Diagnosis not present

## 2015-05-04 DIAGNOSIS — M9903 Segmental and somatic dysfunction of lumbar region: Secondary | ICD-10-CM | POA: Diagnosis not present

## 2015-05-06 DIAGNOSIS — M546 Pain in thoracic spine: Secondary | ICD-10-CM | POA: Diagnosis not present

## 2015-05-06 DIAGNOSIS — M542 Cervicalgia: Secondary | ICD-10-CM | POA: Diagnosis not present

## 2015-05-06 DIAGNOSIS — M9903 Segmental and somatic dysfunction of lumbar region: Secondary | ICD-10-CM | POA: Diagnosis not present

## 2015-05-06 DIAGNOSIS — M9902 Segmental and somatic dysfunction of thoracic region: Secondary | ICD-10-CM | POA: Diagnosis not present

## 2015-05-06 DIAGNOSIS — M9901 Segmental and somatic dysfunction of cervical region: Secondary | ICD-10-CM | POA: Diagnosis not present

## 2015-05-06 DIAGNOSIS — M545 Low back pain: Secondary | ICD-10-CM | POA: Diagnosis not present

## 2015-05-07 ENCOUNTER — Other Ambulatory Visit: Payer: Self-pay | Admitting: Family Medicine

## 2015-05-07 DIAGNOSIS — E1169 Type 2 diabetes mellitus with other specified complication: Secondary | ICD-10-CM

## 2015-05-20 ENCOUNTER — Encounter: Payer: Self-pay | Admitting: Family Medicine

## 2015-05-29 DIAGNOSIS — I714 Abdominal aortic aneurysm, without rupture: Secondary | ICD-10-CM | POA: Diagnosis not present

## 2015-05-29 DIAGNOSIS — I70213 Atherosclerosis of native arteries of extremities with intermittent claudication, bilateral legs: Secondary | ICD-10-CM | POA: Diagnosis not present

## 2015-05-29 DIAGNOSIS — I739 Peripheral vascular disease, unspecified: Secondary | ICD-10-CM | POA: Diagnosis not present

## 2015-05-29 DIAGNOSIS — I700 Atherosclerosis of aorta: Secondary | ICD-10-CM | POA: Diagnosis not present

## 2015-05-29 DIAGNOSIS — E119 Type 2 diabetes mellitus without complications: Secondary | ICD-10-CM | POA: Diagnosis not present

## 2015-05-29 DIAGNOSIS — I10 Essential (primary) hypertension: Secondary | ICD-10-CM | POA: Diagnosis not present

## 2015-05-29 DIAGNOSIS — M79609 Pain in unspecified limb: Secondary | ICD-10-CM | POA: Diagnosis not present

## 2015-05-29 DIAGNOSIS — R109 Unspecified abdominal pain: Secondary | ICD-10-CM | POA: Diagnosis not present

## 2015-07-03 DIAGNOSIS — H40053 Ocular hypertension, bilateral: Secondary | ICD-10-CM | POA: Diagnosis not present

## 2015-07-03 LAB — HM DIABETES EYE EXAM

## 2015-07-08 ENCOUNTER — Encounter: Payer: Self-pay | Admitting: Family Medicine

## 2015-07-24 DIAGNOSIS — M546 Pain in thoracic spine: Secondary | ICD-10-CM | POA: Diagnosis not present

## 2015-07-24 DIAGNOSIS — M9903 Segmental and somatic dysfunction of lumbar region: Secondary | ICD-10-CM | POA: Diagnosis not present

## 2015-07-24 DIAGNOSIS — M545 Low back pain: Secondary | ICD-10-CM | POA: Diagnosis not present

## 2015-07-24 DIAGNOSIS — M9901 Segmental and somatic dysfunction of cervical region: Secondary | ICD-10-CM | POA: Diagnosis not present

## 2015-07-24 DIAGNOSIS — M542 Cervicalgia: Secondary | ICD-10-CM | POA: Diagnosis not present

## 2015-07-24 DIAGNOSIS — M9902 Segmental and somatic dysfunction of thoracic region: Secondary | ICD-10-CM | POA: Diagnosis not present

## 2015-08-05 DIAGNOSIS — M9902 Segmental and somatic dysfunction of thoracic region: Secondary | ICD-10-CM | POA: Diagnosis not present

## 2015-08-05 DIAGNOSIS — M542 Cervicalgia: Secondary | ICD-10-CM | POA: Diagnosis not present

## 2015-08-05 DIAGNOSIS — M9903 Segmental and somatic dysfunction of lumbar region: Secondary | ICD-10-CM | POA: Diagnosis not present

## 2015-08-05 DIAGNOSIS — M9901 Segmental and somatic dysfunction of cervical region: Secondary | ICD-10-CM | POA: Diagnosis not present

## 2015-08-05 DIAGNOSIS — M546 Pain in thoracic spine: Secondary | ICD-10-CM | POA: Diagnosis not present

## 2015-08-05 DIAGNOSIS — M545 Low back pain: Secondary | ICD-10-CM | POA: Diagnosis not present

## 2015-08-07 DIAGNOSIS — M9902 Segmental and somatic dysfunction of thoracic region: Secondary | ICD-10-CM | POA: Diagnosis not present

## 2015-08-07 DIAGNOSIS — M542 Cervicalgia: Secondary | ICD-10-CM | POA: Diagnosis not present

## 2015-08-07 DIAGNOSIS — M9901 Segmental and somatic dysfunction of cervical region: Secondary | ICD-10-CM | POA: Diagnosis not present

## 2015-08-07 DIAGNOSIS — M5136 Other intervertebral disc degeneration, lumbar region: Secondary | ICD-10-CM | POA: Diagnosis not present

## 2015-08-07 DIAGNOSIS — M5030 Other cervical disc degeneration, unspecified cervical region: Secondary | ICD-10-CM | POA: Diagnosis not present

## 2015-08-07 DIAGNOSIS — M546 Pain in thoracic spine: Secondary | ICD-10-CM | POA: Diagnosis not present

## 2015-08-07 DIAGNOSIS — M5134 Other intervertebral disc degeneration, thoracic region: Secondary | ICD-10-CM | POA: Diagnosis not present

## 2015-08-07 DIAGNOSIS — M9903 Segmental and somatic dysfunction of lumbar region: Secondary | ICD-10-CM | POA: Diagnosis not present

## 2015-08-07 DIAGNOSIS — M545 Low back pain: Secondary | ICD-10-CM | POA: Diagnosis not present

## 2015-08-12 DIAGNOSIS — M9901 Segmental and somatic dysfunction of cervical region: Secondary | ICD-10-CM | POA: Diagnosis not present

## 2015-08-12 DIAGNOSIS — M9903 Segmental and somatic dysfunction of lumbar region: Secondary | ICD-10-CM | POA: Diagnosis not present

## 2015-08-12 DIAGNOSIS — M545 Low back pain: Secondary | ICD-10-CM | POA: Diagnosis not present

## 2015-08-12 DIAGNOSIS — M546 Pain in thoracic spine: Secondary | ICD-10-CM | POA: Diagnosis not present

## 2015-08-12 DIAGNOSIS — M542 Cervicalgia: Secondary | ICD-10-CM | POA: Diagnosis not present

## 2015-08-12 DIAGNOSIS — M9902 Segmental and somatic dysfunction of thoracic region: Secondary | ICD-10-CM | POA: Diagnosis not present

## 2015-08-12 DIAGNOSIS — M5136 Other intervertebral disc degeneration, lumbar region: Secondary | ICD-10-CM | POA: Diagnosis not present

## 2015-08-12 DIAGNOSIS — M5030 Other cervical disc degeneration, unspecified cervical region: Secondary | ICD-10-CM | POA: Diagnosis not present

## 2015-08-12 DIAGNOSIS — M5134 Other intervertebral disc degeneration, thoracic region: Secondary | ICD-10-CM | POA: Diagnosis not present

## 2015-08-19 DIAGNOSIS — H2513 Age-related nuclear cataract, bilateral: Secondary | ICD-10-CM | POA: Diagnosis not present

## 2015-08-24 ENCOUNTER — Encounter: Payer: Self-pay | Admitting: Cardiovascular Disease

## 2015-08-24 ENCOUNTER — Ambulatory Visit (INDEPENDENT_AMBULATORY_CARE_PROVIDER_SITE_OTHER): Payer: PPO | Admitting: Cardiovascular Disease

## 2015-08-24 VITALS — BP 140/60 | HR 73 | Ht 63.5 in | Wt 142.5 lb

## 2015-08-24 DIAGNOSIS — I7102 Dissection of abdominal aorta: Secondary | ICD-10-CM | POA: Diagnosis not present

## 2015-08-24 DIAGNOSIS — I2510 Atherosclerotic heart disease of native coronary artery without angina pectoris: Secondary | ICD-10-CM | POA: Diagnosis not present

## 2015-08-24 DIAGNOSIS — I10 Essential (primary) hypertension: Secondary | ICD-10-CM

## 2015-08-24 DIAGNOSIS — E1169 Type 2 diabetes mellitus with other specified complication: Secondary | ICD-10-CM | POA: Diagnosis not present

## 2015-08-24 DIAGNOSIS — E785 Hyperlipidemia, unspecified: Secondary | ICD-10-CM

## 2015-08-24 NOTE — Assessment & Plan Note (Signed)
Currently with no symptoms of angina. No further workup at this time. Continue current medication regimen. 

## 2015-08-24 NOTE — Assessment & Plan Note (Signed)
Long discussion with her concerning her blood pressure medication regiment Cause of her low sodium likely secondary to HCTZ. She has indicated she would like to avoid complications from her medications, Recommended she cut the amiloride HCTZ in half daily. She denies any lower extremity edema or shortness of breath. She'll monitor blood pressure at home, if this runs high, we would increase losartan up to 100 mg daily

## 2015-08-24 NOTE — Assessment & Plan Note (Signed)
We have encouraged continued exercise, careful diet management in an effort to lose weight. She walks 2 miles daily

## 2015-08-24 NOTE — Patient Instructions (Addendum)
You are doing well. No medication changes were made.  Aorto iliac u/s in one year for dissection  Cut the HCTZ pill in 1/2 Call if blood pressure are elevated, We would increase the losartan  [facility]: Dr. Sherie DonLada, Dr. Barb MerinoSowles  [facility]:  Netta CedarsWalker, [place], [place]  Please call us if you have new issues that need to be addressed before your next appt.  Your physician wants you to follow-up in: 12 months.  You will receive a reminder letter in the mail two months in advance. If you don't receive a letter, please call our office to schedule the follow-up appointment.

## 2015-08-24 NOTE — Assessment & Plan Note (Signed)
She has indicated she would like to have periodic ultrasound to our office, previously managed by Dr. Wyn Quakerew. Prior ultrasounds have been requested

## 2015-08-24 NOTE — Assessment & Plan Note (Signed)
Most recent lipid panel not available She will have annual Medicare follow-up with new primary care physician Suggested that time she request new lab work   Total encounter time more than 25 minutes  Greater than 50% was spent in counseling and coordination of care with the patient

## 2015-08-24 NOTE — Progress Notes (Signed)
Patient ID: Jessica Wheeler, female    DOB: January 15, 1941, 74 y.o.   MRN: [id number]  HPI Comments: [name] is a very pleasant 75 year old woman with a history of diabetes, hypertension, smoking, mild coronary artery disease by cardiac catheterization in 2007 who presents for routine followup of her coronary artery disease.  She reports history of aortic dissection following catheterization, also with renal artery stenosis followed by Dr. Wyn Quaker. Chronically low sodium.  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   In follow-up, she is scheduled to have cataract surgery in one week She is active, walks 2 miles per day Denies any chest pain concerning for angina, no significant shortness of breath She is concerned about her prior history of aortic dissection and would like this managed through our office with periodic ultrasound. Would like to have the first one done in 1 year, does not feel a need to be done every 6 months.  She has been monitoring her sugars, hemoglobin A1c 6.3 up to 6.6. Prior sodium 132, likely secondary to HCTZ. She feels her blood pressure is relatively well-controlled She has been on amiloride HCTZ for many many years  Reports she needs to change primary care physicians  EKG on today's visit shows normal sinus rhythm with rate 73 bpm, right bundle branch block  Other past medical history reviewed severe reaction to pneumonia shot last year in 2015, still with hand weakness, joint problems. She had a rash initially that has resolved  Prior history of shingles  Blood pressure has been up and down but in general within a reasonable range, sugars also climbing slightly. Regular exercise program every day.  Previous  Echocardiogram was performed that was normal  Stress test/Myoview was also normal showing no ischemia .   Previous  Lab work shows normal TSH,  sodium 132, hemoglobin A1c 6.5   Allergies  Allergen Reactions  . Beta Adrenergic Blockers     Chest pressure/difficulty breathing  . Levaquin [Levofloxacin In D5w]     Low blood sugar  . Lisinopril Cough  . Prevnar [Pneumococcal 13-Val Conj Vacc] Rash    Joint Pain    Outpatient Encounter Prescriptions as of 08/24/2015  Medication Sig  . amiloride-hydrochlorothiazide (MODURETIC) 5-50 MG tablet Take 1 tablet by mouth daily.  Marland Kitchen amLODipine (NORVASC) 5 MG tablet Take 1 tablet (5 mg total) by mouth daily.  Marland Kitchen aspirin 81 MG EC tablet Take 81 mg by mouth daily.    . clobetasol ointment (TEMOVATE) 0.05 % Reported on 08/21/2015  . Ibuprofen (ADVIL) 200 MG CAPS Take 1 capsule by mouth daily.    Marland Kitchen latanoprost (XALATAN) 0.005 % ophthalmic solution INT 1 GTT IN EACH EYE HS  . losartan (COZAAR) 50 MG tablet Take 1 tablet (50 mg total) by mouth daily.  . metFORMIN (GLUCOPHAGE) 500 MG tablet TAKE 2 TABLETS BY MOUTH TWICE DAILY  . nitroGLYCERIN (NITROSTAT) 0.4 MG SL tablet Place 1 tablet (0.4 mg total) under the tongue every 5 (five) minutes as needed for chest pain.  . vitamin B-12 (CYANOCOBALAMIN) 1000 MCG tablet Take 1,000 mcg by mouth daily.   No facility-administered encounter medications on file as of 08/24/2015.    Past Medical History  Diagnosis Date  . Diabetes mellitus   . Dissection, aorta (HCC)     Dr.Dew follows  . History of bronchitis   . Glaucoma 2005  .  Hypertension 1982  . Personal history of tobacco use, presenting hazards to health   . Ulcer   . History of pancreatitis 2005  . Arthritis   . Diverticulosis   . Family history of malignant neoplasm of gastrointestinal tract   . Diffuse cystic mastopathy   . Bundle branch block, right     Past Surgical History  Procedure Laterality Date  . Back surgery  1982    lower surgery  . Cardiac catheterization      Dr. Lady GaryFath, few years ago  . Cholecystectomy  1994  . Abdominal hysterectomy  1991  . Colonoscopy   2008,02/22/2012    Dr. Sankar-2013  . Appendectomy    . Breast surgery      biopsy  . Bladder surgery    . Breast biopsy Right 1980's    Social History  reports that she quit smoking about 12 years ago. Her smoking use included Cigarettes. She has a 15 pack-year smoking history. She has never used smokeless tobacco. She reports that she does not drink alcohol or use illicit drugs.  Family History family history includes Anxiety disorder in her mother; Breast cancer (age of onset: 7550) in her cousin; Breast cancer (age of onset: 3560) in her maternal aunt; Colon cancer in her brother; Diabetes in her mother; Heart attack in her father; Heart disease in her father; Hypertension in her father, maternal grandfather, maternal grandmother, and mother.   Review of Systems  Constitutional: Negative.   Respiratory: Negative.   Cardiovascular: Negative.   Gastrointestinal: Negative.   Musculoskeletal: Positive for arthralgias.  Skin: Negative.   Neurological: Negative.   Hematological: Negative.   Psychiatric/Behavioral: Negative.   All other systems reviewed and are negative.   BP 140/60 mmHg  Pulse 73  Ht 5' 3.5" (1.613 m)  Wt 142 lb 8 oz (64.638 kg)  BMI 24.84 kg/m2  Physical Exam  Constitutional: She is oriented to person, place, and time. She appears well-developed and well-nourished.  HENT:  Head: Normocephalic.  Nose: Nose normal.  Mouth/Throat: Oropharynx is clear and moist.  Eyes: Conjunctivae are normal. Pupils are equal, round, and reactive to light.  Neck: Normal range of motion. Neck supple. No JVD present.  Cardiovascular: Normal rate, regular rhythm, S1 normal, S2 normal, normal heart sounds and intact distal pulses.  Exam reveals no gallop and no friction rub.   No murmur heard. Pulmonary/Chest: Effort normal and breath sounds normal. No respiratory distress. She has no wheezes. She has no rales. She exhibits no tenderness.  Abdominal: Soft. Bowel sounds are normal.  She exhibits no distension. There is no tenderness.  Musculoskeletal: Normal range of motion. She exhibits no edema or tenderness.  Lymphadenopathy:    She has no cervical adenopathy.  Neurological: She is alert and oriented to person, place, and time. Coordination normal.  Skin: Skin is warm and dry. No rash noted. No erythema.  Psychiatric: She has a normal mood and affect. Her behavior is normal. Judgment and thought content normal.    Assessment and Plan  Nursing note and vitals reviewed.

## 2015-08-26 DIAGNOSIS — M5134 Other intervertebral disc degeneration, thoracic region: Secondary | ICD-10-CM | POA: Diagnosis not present

## 2015-08-26 DIAGNOSIS — M9903 Segmental and somatic dysfunction of lumbar region: Secondary | ICD-10-CM | POA: Diagnosis not present

## 2015-08-26 DIAGNOSIS — M9901 Segmental and somatic dysfunction of cervical region: Secondary | ICD-10-CM | POA: Diagnosis not present

## 2015-08-26 DIAGNOSIS — M546 Pain in thoracic spine: Secondary | ICD-10-CM | POA: Diagnosis not present

## 2015-08-26 DIAGNOSIS — M545 Low back pain: Secondary | ICD-10-CM | POA: Diagnosis not present

## 2015-08-26 DIAGNOSIS — M5136 Other intervertebral disc degeneration, lumbar region: Secondary | ICD-10-CM | POA: Diagnosis not present

## 2015-08-26 DIAGNOSIS — M5030 Other cervical disc degeneration, unspecified cervical region: Secondary | ICD-10-CM | POA: Diagnosis not present

## 2015-08-26 DIAGNOSIS — M9902 Segmental and somatic dysfunction of thoracic region: Secondary | ICD-10-CM | POA: Diagnosis not present

## 2015-08-26 DIAGNOSIS — M542 Cervicalgia: Secondary | ICD-10-CM | POA: Diagnosis not present

## 2015-08-28 NOTE — Discharge Instructions (Signed)

## 2015-08-31 ENCOUNTER — Ambulatory Visit
Admission: RE | Admit: 2015-08-31 | Discharge: 2015-08-31 | Disposition: A | Discharge status: Home / Self Care | Payer: PPO | Source: Ambulatory Visit | Attending: Ophthalmology | Admitting: Ophthalmology

## 2015-08-31 ENCOUNTER — Encounter
Admission: RE | Disposition: A | Discharge status: Home / Self Care | Payer: Self-pay | Source: Ambulatory Visit | Attending: Ophthalmology

## 2015-08-31 ENCOUNTER — Ambulatory Visit: Payer: PPO | Admitting: Anesthesiology

## 2015-08-31 DIAGNOSIS — Z87891 Personal history of nicotine dependence: Secondary | ICD-10-CM | POA: Insufficient documentation

## 2015-08-31 DIAGNOSIS — I4510 Unspecified right bundle-branch block: Secondary | ICD-10-CM | POA: Insufficient documentation

## 2015-08-31 DIAGNOSIS — E119 Type 2 diabetes mellitus without complications: Secondary | ICD-10-CM | POA: Diagnosis not present

## 2015-08-31 DIAGNOSIS — K5792 Diverticulitis of intestine, part unspecified, without perforation or abscess without bleeding: Secondary | ICD-10-CM | POA: Insufficient documentation

## 2015-08-31 DIAGNOSIS — H2512 Age-related nuclear cataract, left eye: Secondary | ICD-10-CM | POA: Diagnosis not present

## 2015-08-31 DIAGNOSIS — Z90710 Acquired absence of both cervix and uterus: Secondary | ICD-10-CM | POA: Diagnosis not present

## 2015-08-31 DIAGNOSIS — Z9049 Acquired absence of other specified parts of digestive tract: Secondary | ICD-10-CM | POA: Insufficient documentation

## 2015-08-31 DIAGNOSIS — Z79899 Other long term (current) drug therapy: Secondary | ICD-10-CM | POA: Diagnosis not present

## 2015-08-31 DIAGNOSIS — H269 Unspecified cataract: Secondary | ICD-10-CM | POA: Diagnosis not present

## 2015-08-31 DIAGNOSIS — Z7982 Long term (current) use of aspirin: Secondary | ICD-10-CM | POA: Insufficient documentation

## 2015-08-31 DIAGNOSIS — Z881 Allergy status to other antibiotic agents status: Secondary | ICD-10-CM | POA: Diagnosis not present

## 2015-08-31 DIAGNOSIS — Z888 Allergy status to other drugs, medicaments and biological substances status: Secondary | ICD-10-CM | POA: Insufficient documentation

## 2015-08-31 DIAGNOSIS — H2513 Age-related nuclear cataract, bilateral: Secondary | ICD-10-CM | POA: Diagnosis not present

## 2015-08-31 DIAGNOSIS — I10 Essential (primary) hypertension: Secondary | ICD-10-CM | POA: Diagnosis not present

## 2015-08-31 HISTORY — DX: Unspecified right bundle-branch block: I45.10

## 2015-08-31 HISTORY — PX: CATARACT EXTRACTION W/PHACO: SHX586

## 2015-08-31 LAB — GLUCOSE, CAPILLARY
Glucose-Capillary: 146 mg/dL — ABNORMAL HIGH (ref 65–99)
Glucose-Capillary: 130 mg/dL — ABNORMAL HIGH (ref 65–99)

## 2015-08-31 SURGERY — PHACOEMULSIFICATION, CATARACT, WITH IOL INSERTION
Anesthesia: Monitor Anesthesia Care | Laterality: Left | Wound class: Clean

## 2015-08-31 MED ORDER — LACTATED RINGERS IV SOLN
INTRAVENOUS | Status: DC
Start: 2015-08-31 — End: 2015-08-31

## 2015-08-31 MED ORDER — TETRACAINE HCL 0.5 % OP SOLN
1.0000 [drp] | OPHTHALMIC | Status: DC | PRN
  Administered 2015-08-31: 1 [drp] via OPHTHALMIC

## 2015-08-31 MED ORDER — ARMC OPHTHALMIC DILATING GEL
1.0000 "application " | OPHTHALMIC | Status: DC | PRN
  Administered 2015-08-31 (×2): 1 via OPHTHALMIC

## 2015-08-31 MED ORDER — BRIMONIDINE TARTRATE 0.2 % OP SOLN
OPHTHALMIC | Status: DC | PRN
  Administered 2015-08-31: 1 [drp] via OPHTHALMIC

## 2015-08-31 MED ORDER — NA HYALUR & NA CHOND-NA HYALUR 0.4-0.35 ML IO KIT
PACK | INTRAOCULAR | Status: DC | PRN
  Administered 2015-08-31: 1 mL via INTRAOCULAR

## 2015-08-31 MED ORDER — ACETAMINOPHEN 325 MG PO TABS: 325.0000 mg | ORAL_TABLET | ORAL | Status: DC | PRN

## 2015-08-31 MED ORDER — FENTANYL CITRATE (PF) 100 MCG/2ML IJ SOLN
INTRAMUSCULAR | Status: DC | PRN
  Administered 2015-08-31: 50 ug via INTRAVENOUS

## 2015-08-31 MED ORDER — MIDAZOLAM HCL 2 MG/2ML IJ SOLN
INTRAMUSCULAR | Status: DC | PRN
  Administered 2015-08-31: 2 mg via INTRAVENOUS

## 2015-08-31 MED ORDER — ACETAMINOPHEN 160 MG/5ML PO SOLN: 325.0000 mg | ORAL | Status: DC | PRN

## 2015-08-31 MED ORDER — TIMOLOL MALEATE 0.5 % OP SOLN
OPHTHALMIC | Status: DC | PRN
  Administered 2015-08-31: 1 [drp] via OPHTHALMIC

## 2015-08-31 MED ORDER — BSS IO SOLN
INTRAOCULAR | Status: DC | PRN
  Administered 2015-08-31: 75 mL

## 2015-08-31 MED ORDER — CEFUROXIME OPHTHALMIC INJECTION 1 MG/0.1 ML
INJECTION | OPHTHALMIC | Status: DC | PRN
  Administered 2015-08-31: 0.1 mL via INTRACAMERAL

## 2015-08-31 MED ORDER — BALANCED SALT IO SOLN
INTRAOCULAR | Status: DC | PRN
  Administered 2015-08-31: 1 mL via OPHTHALMIC

## 2015-08-31 MED ORDER — POVIDONE-IODINE 5 % OP SOLN
1.0000 "application " | OPHTHALMIC | Status: DC | PRN
  Administered 2015-08-31: 1 via OPHTHALMIC

## 2015-08-31 SURGICAL SUPPLY — 28 items
APPLICATOR COTTON TIP 3IN (MISCELLANEOUS) ×2 IMPLANT
CANNULA ANT/CHMB 27GA (MISCELLANEOUS) ×2 IMPLANT
DISSECTOR HYDRO NUCLEUS 50X22 (MISCELLANEOUS) ×2 IMPLANT
GLOVE BIO SURGEON STRL SZ7 (GLOVE) ×2 IMPLANT
GLOVE SURG LX 6.5 MICRO (GLOVE) ×1
GLOVE SURG LX STRL 6.5 MICRO (GLOVE) ×1 IMPLANT
GOWN STRL REUS W/ TWL LRG LVL3 (GOWN DISPOSABLE) ×2 IMPLANT
GOWN STRL REUS W/TWL LRG LVL3 (GOWN DISPOSABLE) ×2
LENS IOL ACRYSOF IQ 21.5 (Intraocular Lens) ×2 IMPLANT
MARKER SKIN DUAL TIP RULER LAB (MISCELLANEOUS) ×2 IMPLANT
NEEDLE FILTER BLUNT 18X 1/2SAF (NEEDLE) ×1
NEEDLE FILTER BLUNT 18X1 1/2 (NEEDLE) ×1 IMPLANT
PACK CATARACT BRASINGTON (MISCELLANEOUS) ×2 IMPLANT
PACK EYE AFTER SURG (MISCELLANEOUS) ×2 IMPLANT
PACK OPTHALMIC (MISCELLANEOUS) ×2 IMPLANT
RING MALYGIN 7.0 (MISCELLANEOUS) IMPLANT
SOL BAL SALT 15ML (MISCELLANEOUS)
SOLUTION BAL SALT 15ML (MISCELLANEOUS) IMPLANT
SUT ETHILON 10-0 CS-B-6CS-B-6 (SUTURE)
SUT VICRYL  9 0 (SUTURE)
SUT VICRYL 9 0 (SUTURE) IMPLANT
SUTURE EHLN 10-0 CS-B-6CS-B-6 (SUTURE) IMPLANT
SYR 3ML LL SCALE MARK (SYRINGE) ×2 IMPLANT
SYR TB 1ML LUER SLIP (SYRINGE) ×2 IMPLANT
WATER STERILE IRR 250ML POUR (IV SOLUTION) ×2 IMPLANT
WATER STERILE IRR 500ML POUR (IV SOLUTION) IMPLANT
WICK EYE OCUCEL (MISCELLANEOUS) IMPLANT
WIPE NON LINTING 3.25X3.25 (MISCELLANEOUS) ×2 IMPLANT

## 2015-08-31 NOTE — Op Note (Signed)
Date of Surgery: 08/31/2015  PREOPERATIVE DIAGNOSES: Visually significant nuclear sclerotic cataract, left eye.  POSTOPERATIVE DIAGNOSES: Same  PROCEDURES PERFORMED: Cataract extraction with intraocular lens implant, left eye.  SURGEON: Devin GoingAnita P. Vin, M.D.  ANESTHESIA: MAC and topical  IMPLANTS: [id number] +21.5 D   Implant Name Type Inv. Item Serial No. Manufacturer Lot No. LRB No. Used  AcrysofIQ iol Intraocular Lens   [id number] ALCON   Left 1    COMPLICATIONS: None.  DESCRIPTION OF PROCEDURE: Therapeutic options were discussed with the patient preoperatively, including a discussion of risks and benefits of surgery. Informed consent was obtained. An IOL-Master and immersion biometry were used to take the lens measurements, and a dilated fundus exam was performed within 6 months of the surgical date.  The patient was premedicated and brought to the operating room and placed on the operating table in the supine position. After adequate anesthesia, the patient was prepped and draped in the usual sterile ophthalmic fashion. A wire lid speculum was inserted and the microscope was positioned. A Superblade was used to create a paracentesis site at the limbus and a small amount of dilute preservative free lidocaine was instilled into the anterior chamber, followed by dispersive viscoelastic. A clear corneal incision was created temporally using a 2.4 mm keratome blade. Capsulorrhexis was then performed. In situ phacoemulsification was performed.  Cortical material was removed with the irrigation-aspiration unit. Dispersive viscoelastic was instilled to open the capsular bag. A posterior chamber intraocular lens with the specifications above was inserted and positioned. Irrigation-aspiration was used to remove all viscoelastic. Cefuroxime 1cc was instilled into the anterior chamber, and the corneal incision was checked and found to be water tight. The eyelid speculum was removed.  The operative eye was  covered with protective goggles after instilling 1 drop of timolol and brimonidine. The patient tolerated the procedure well. There were no complications.

## 2015-08-31 NOTE — Anesthesia Procedure Notes (Signed)
Procedure Name: MAC Performed by: Adisa Vigeant Pre-anesthesia Checklist: Patient identified, Emergency Drugs available, Suction available, Timeout performed and Patient being monitored Patient Re-evaluated:Patient Re-evaluated prior to inductionOxygen Delivery Method: Nasal cannula Placement Confirmation: positive ETCO2     

## 2015-08-31 NOTE — Anesthesia Postprocedure Evaluation (Signed)
Anesthesia Post Note  Patient: Jessica Wheeler  Procedure(s) Performed: Procedure(s) (LRB): CATARACT EXTRACTION PHACO AND INTRAOCULAR LENS PLACEMENT (IOC) (Left)  Patient location during evaluation: PACU Anesthesia Type: MAC Level of consciousness: awake and alert and oriented Pain management: satisfactory to patient Vital Signs Assessment: post-procedure vital signs reviewed and stable Respiratory status: spontaneous breathing, nonlabored ventilation and respiratory function stable Cardiovascular status: blood pressure returned to baseline and stable Postop Assessment: Adequate PO intake and No signs of nausea or vomiting Anesthetic complications: no    Cherly BeachStella, Laticia Vannostrand J

## 2015-08-31 NOTE — Anesthesia Preprocedure Evaluation (Signed)
Anesthesia Evaluation  Patient identified by MRN, date of birth, ID band  Reviewed: Allergy & Precautions, H&P , NPO status , Patient's Chart, lab work & pertinent test results  Airway Mallampati: II  TM Distance: >3 FB Neck ROM: full    Dental  (+) Upper Dentures, Lower Dentures   Pulmonary former smoker,    Pulmonary exam normal        Cardiovascular hypertension, + CAD and + Peripheral Vascular Disease   Rhythm:regular Rate:Normal     Neuro/Psych    GI/Hepatic   Endo/Other  diabetes  Renal/GU      Musculoskeletal   Abdominal   Peds  Hematology   Anesthesia Other Findings   Reproductive/Obstetrics                             Anesthesia Physical Anesthesia Plan  ASA: III  Anesthesia Plan: MAC   Post-op Pain Management:    Induction:   Airway Management Planned:   Additional Equipment:   Intra-op Plan:   Post-operative Plan:   Informed Consent: I have reviewed the patients History and Physical, chart, labs and discussed the procedure including the risks, benefits and alternatives for the proposed anesthesia with the patient or authorized representative who has indicated his/her understanding and acceptance.     Plan Discussed with: CRNA  Anesthesia Plan Comments:         Anesthesia Quick Evaluation

## 2015-08-31 NOTE — Transfer of Care (Signed)
Immediate Anesthesia Transfer of Care Note  Patient: Jessica BrightlyShirley D Sallas  Procedure(s) Performed: Procedure(s) with comments: CATARACT EXTRACTION PHACO AND INTRAOCULAR LENS PLACEMENT (IOC) (Left) - DIABETIC - oral meds  Patient Location: PACU  Anesthesia Type: MAC  Level of Consciousness: awake, alert  and patient cooperative  Airway and Oxygen Therapy: Patient Spontanous Breathing and Patient connected to supplemental oxygen  Post-op Assessment: Post-op Vital signs reviewed, Patient's Cardiovascular Status Stable, Respiratory Function Stable, Patent Airway and No signs of Nausea or vomiting  Post-op Vital Signs: Reviewed and stable  Complications: No apparent anesthesia complications

## 2015-08-31 NOTE — H&P (Signed)
H+P reviewed and is up to date, please see paper chart.  

## 2015-09-01 ENCOUNTER — Encounter: Payer: Self-pay | Admitting: Ophthalmology

## 2015-09-21 ENCOUNTER — Ambulatory Visit (INDEPENDENT_AMBULATORY_CARE_PROVIDER_SITE_OTHER): Payer: PPO | Admitting: Family Medicine

## 2015-09-21 ENCOUNTER — Encounter: Payer: Self-pay | Admitting: Family Medicine

## 2015-09-21 VITALS — BP 112/54 | HR 80 | Temp 98.3°F | Ht 63.5 in | Wt 141.0 lb

## 2015-09-21 DIAGNOSIS — I7102 Dissection of abdominal aorta: Secondary | ICD-10-CM

## 2015-09-21 DIAGNOSIS — I10 Essential (primary) hypertension: Secondary | ICD-10-CM

## 2015-09-21 DIAGNOSIS — E785 Hyperlipidemia, unspecified: Secondary | ICD-10-CM | POA: Diagnosis not present

## 2015-09-21 DIAGNOSIS — E1169 Type 2 diabetes mellitus with other specified complication: Secondary | ICD-10-CM | POA: Diagnosis not present

## 2015-09-21 NOTE — Progress Notes (Signed)
Pre visit review using our clinic review tool, if applicable. No additional management support is needed unless otherwise documented below in the visit note. 

## 2015-09-21 NOTE — Patient Instructions (Signed)
Nice to meet you. We are going to request records from the test program that you're a part of see what additional lab work needs to be ordered. Please continue your current medication regimen as you have been. Please continue to exercise.

## 2015-09-22 NOTE — Progress Notes (Signed)
Patient ID: Jessica BrightlyShirley D Wheeler, female   DOB: 1940/05/10, 75 y.o.   MRN: [id number]  Jessica AlarEric Issiah Huffaker, MD Phone: [phone]  Jessica BrightlyShirley D Solow is a 75 y.o. female who presents today for new patient visit.  HYPERTENSION Disease Monitoring: Blood pressure range-not checking Chest pain, palpitations- no      Dyspnea- no Medications: Compliance- taking losartan, amiloride, HCTZ          Edema- no  DIABETES Disease Monitoring: Blood Sugar ranges-not checking, last A1c was 6.2 Polyuria/phagia/dipsia- no      ophthalmology- has had cataracts removed and seen recently. Medications: Compliance- taking metformin and some experimental drug through [facility]  HYPERLIPIDEMIA Disease Monitoring: See symptoms for Hypertension Medications: Compliance- no medications currently, controlling with diet and exercise. Walks 2 miles a day.  Patient is followed by cardiology for her prior history of abdominal aortic dissection. No abdominal pain.    Active Ambulatory Problems    Diagnosis Date Noted  . HTN (hypertension) 08/03/2010  . CAD (coronary artery disease) 08/03/2010  . DM (diabetes mellitus) (HCC) 08/03/2010  . Hyperlipidemia 08/03/2010  . Aortic dissection, abdominal (HCC) 03/31/2011  . Chest pain 10/10/2011  . Murmur 10/10/2011  . Renal artery stenosis (HCC) 04/18/2012  . UTI (urinary tract infection) 11/01/2012  . Fibrocystic breast disease 04/30/2013  . Muscle weakness 06/24/2014  . Shingles 11/04/2014  . Anxiety 11/04/2014  . Allergic rhinitis 01/31/2015  . Aortic dissection (HCC) 01/31/2015  . Benign paroxysmal positional nystagmus 01/31/2015  . Bladder infection, chronic 01/31/2015  . Atherosclerosis of coronary artery 01/31/2015  . Dysfunction of eustachian tube 01/31/2015  . Glaucoma 01/31/2015  . Alopecia 01/31/2015  . BP (high blood pressure) 01/31/2015  . Below normal amount of sodium in the blood 01/31/2015  . Malaise and fatigue 01/31/2015  . Cramp in muscle 01/31/2015    . 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HCC) 03/31/2011  . 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 Diabetes mellitus, type 2 (HCC) 01/31/2015   Past Medical History  Diagnosis Date  . Diabetes mellitus   . Dissection, aorta (HCC)   . History of bronchitis   . Hypertension 1982  . Personal history of tobacco use, presenting hazards to health   . Ulcer   . History of pancreatitis 2005  . Arthritis   . Diverticulosis   . Family history of malignant neoplasm of gastrointestinal tract   . Diffuse cystic mastopathy   . Bundle branch block, right     Family History  Problem Relation Age of Onset  . Diabetes Mother   . Hypertension Mother   . Anxiety disorder Mother   . Hypertension Father   . Heart disease Father   . Heart attack Father   . Colon cancer Brother   . Hypertension Maternal Grandmother   . Hypertension Maternal Grandfather   . Breast cancer Maternal Aunt 60  . Breast cancer Cousin 7450    Social History   Social History  . Marital Status: Widowed    Spouse Name: N/A  . Number of Children: 2  . Years of Education: College   Occupational History  . Retired    Social History Main Topics  . Smoking status: Former Smoker -- 1.00 packs/day for 15 years    Types: Cigarettes    Quit date: 08/03/2003  . Smokeless tobacco: Never Used  . Alcohol Use: No  . Drug Use: No  . Sexual Activity:  Not on file   Other Topics Concern  . Not on file   Social History Narrative    ROS  General:  Negative for nexplained weight loss, fever Skin: Negative for new or changing mole, sore that won't heal HEENT: Negative for trouble hearing, trouble seeing, ringing in ears, mouth sores, hoarseness, change in voice, dysphagia. CV:  Negative for chest pain, dyspnea, edema, palpitations Resp: Negative for cough, dyspnea, hemoptysis GI: Negative for nausea, vomiting, diarrhea,  constipation, abdominal pain, melena, hematochezia. GU: Negative for dysuria, incontinence, urinary hesitance, hematuria, vaginal or penile discharge, polyuria, sexual difficulty, lumps in testicle or breasts MSK: Negative for muscle cramps or aches, joint pain or swelling Neuro: Negative for headaches, weakness, numbness, dizziness, passing out/fainting Psych: Negative for depression, anxiety, memory problems  Objective  Physical Exam Filed Vitals:   09/21/15 1041  BP: 112/54  Pulse: 80  Temp: 98.3 F (36.8 C)    BP Readings from Last 3 Encounters:  09/21/15 112/54  08/31/15 164/66  08/24/15 140/60   Wt Readings from Last 3 Encounters:  09/21/15 141 lb (63.957 kg)  08/31/15 136 lb (61.689 kg)  08/24/15 142 lb 8 oz (64.638 kg)    Physical Exam  Constitutional: She is well-developed, well-nourished, and in no distress.  HENT:  Head: Normocephalic and atraumatic.  Right Ear: External ear normal.  Left Ear: External ear normal.  Mouth/Throat: Oropharynx is clear and moist.  Eyes: Conjunctivae are normal. Pupils are equal, round, and reactive to light.  Neck: Neck supple.  Cardiovascular: Normal rate, regular rhythm and normal heart sounds.   Pulmonary/Chest: Effort normal and breath sounds normal.  Abdominal: Soft. Bowel sounds are normal. She exhibits no distension. There is no tenderness. There is no rebound and no guarding.  Musculoskeletal: She exhibits no edema.  Lymphadenopathy:    She has no cervical adenopathy.  Neurological: She is alert. Gait normal.  Skin: Skin is warm and dry. She is not diaphoretic.  Psychiatric: Mood and affect normal.     Assessment/Plan:   Aortic dissection, abdominal Continue to follow with cardiology.  DM (diabetes mellitus) Last A1c at goal. Continue metformin. She'll continue to take part in the trial at [facility].  HTN (hypertension) Well-controlled. Has recently had to cut her amiloride-HCTZ dose in half given history of  hyponatremia. She does not want to recheck this today. We'll request lab work from [facility].  Hyperlipidemia No recent lipid panel. We'll request the records from her trial at [facility].    Jessica Alar, MD [facility]

## 2015-09-22 NOTE — Assessment & Plan Note (Signed)
Last A1c at goal. Continue metformin. [facility].

## 2015-09-22 NOTE — Assessment & Plan Note (Signed)
Continue to follow with cardiology.

## 2015-09-22 NOTE — Assessment & Plan Note (Signed)
Well-controlled. Has recently had to cut her amiloride-HCTZ dose in half given history of hyponatremia. She does not want to recheck this today. We'll request lab work from [facility].

## 2015-09-22 NOTE — Assessment & Plan Note (Signed)
No recent lipid panel. We'll request the [facility].

## 2015-10-02 ENCOUNTER — Encounter: Payer: Self-pay | Admitting: *Deleted

## 2015-10-06 DIAGNOSIS — H2511 Age-related nuclear cataract, right eye: Secondary | ICD-10-CM | POA: Diagnosis not present

## 2015-10-07 NOTE — Discharge Instructions (Signed)

## 2015-10-12 ENCOUNTER — Ambulatory Visit: Payer: PPO | Admitting: Anesthesiology

## 2015-10-12 ENCOUNTER — Encounter: Payer: Self-pay | Admitting: *Deleted

## 2015-10-12 ENCOUNTER — Encounter
Admission: RE | Disposition: A | Discharge status: Home / Self Care | Payer: Self-pay | Source: Ambulatory Visit | Attending: Ophthalmology

## 2015-10-12 ENCOUNTER — Ambulatory Visit
Admission: RE | Admit: 2015-10-12 | Discharge: 2015-10-12 | Disposition: A | Discharge status: Home / Self Care | Payer: PPO | Source: Ambulatory Visit | Attending: Ophthalmology | Admitting: Ophthalmology

## 2015-10-12 DIAGNOSIS — I10 Essential (primary) hypertension: Secondary | ICD-10-CM | POA: Insufficient documentation

## 2015-10-12 DIAGNOSIS — Z79899 Other long term (current) drug therapy: Secondary | ICD-10-CM | POA: Insufficient documentation

## 2015-10-12 DIAGNOSIS — Z888 Allergy status to other drugs, medicaments and biological substances status: Secondary | ICD-10-CM | POA: Insufficient documentation

## 2015-10-12 DIAGNOSIS — H2511 Age-related nuclear cataract, right eye: Secondary | ICD-10-CM | POA: Diagnosis not present

## 2015-10-12 DIAGNOSIS — Z90710 Acquired absence of both cervix and uterus: Secondary | ICD-10-CM | POA: Insufficient documentation

## 2015-10-12 DIAGNOSIS — Z9889 Other specified postprocedural states: Secondary | ICD-10-CM | POA: Diagnosis not present

## 2015-10-12 DIAGNOSIS — E119 Type 2 diabetes mellitus without complications: Secondary | ICD-10-CM | POA: Insufficient documentation

## 2015-10-12 DIAGNOSIS — Z9049 Acquired absence of other specified parts of digestive tract: Secondary | ICD-10-CM | POA: Insufficient documentation

## 2015-10-12 DIAGNOSIS — Z7982 Long term (current) use of aspirin: Secondary | ICD-10-CM | POA: Diagnosis not present

## 2015-10-12 DIAGNOSIS — Z9842 Cataract extraction status, left eye: Secondary | ICD-10-CM | POA: Insufficient documentation

## 2015-10-12 DIAGNOSIS — Z9109 Other allergy status, other than to drugs and biological substances: Secondary | ICD-10-CM | POA: Insufficient documentation

## 2015-10-12 DIAGNOSIS — Z87891 Personal history of nicotine dependence: Secondary | ICD-10-CM | POA: Diagnosis not present

## 2015-10-12 DIAGNOSIS — H269 Unspecified cataract: Secondary | ICD-10-CM | POA: Diagnosis not present

## 2015-10-12 HISTORY — PX: CATARACT EXTRACTION W/PHACO: SHX586

## 2015-10-12 LAB — GLUCOSE, CAPILLARY
Glucose-Capillary: 120 mg/dL — ABNORMAL HIGH (ref 65–99)
Glucose-Capillary: 141 mg/dL — ABNORMAL HIGH (ref 65–99)

## 2015-10-12 SURGERY — PHACOEMULSIFICATION, CATARACT, WITH IOL INSERTION
Anesthesia: Monitor Anesthesia Care | Site: Eye | Laterality: Right | Wound class: Clean

## 2015-10-12 MED ORDER — LACTATED RINGERS IV SOLN: INTRAVENOUS | Status: DC

## 2015-10-12 MED ORDER — ARMC OPHTHALMIC DILATING GEL
1.0000 "application " | OPHTHALMIC | Status: DC | PRN
  Administered 2015-10-12 (×2): 1 via OPHTHALMIC

## 2015-10-12 MED ORDER — LIDOCAINE HCL (PF) 4 % IJ SOLN
INTRAOCULAR | Status: DC | PRN
  Administered 2015-10-12: 1 mL via OPHTHALMIC

## 2015-10-12 MED ORDER — TETRACAINE HCL 0.5 % OP SOLN
1.0000 [drp] | OPHTHALMIC | Status: DC | PRN
  Administered 2015-10-12: 1 [drp] via OPHTHALMIC

## 2015-10-12 MED ORDER — EPINEPHRINE HCL 1 MG/ML IJ SOLN
INTRAOCULAR | Status: DC | PRN
  Administered 2015-10-12: 104 mL via OPHTHALMIC

## 2015-10-12 MED ORDER — CEFUROXIME OPHTHALMIC INJECTION 1 MG/0.1 ML
INJECTION | OPHTHALMIC | Status: DC | PRN
  Administered 2015-10-12: 0.1 mL via OPHTHALMIC

## 2015-10-12 MED ORDER — NA HYALUR & NA CHOND-NA HYALUR 0.4-0.35 ML IO KIT
PACK | INTRAOCULAR | Status: DC | PRN
  Administered 2015-10-12: 1 mL via INTRAOCULAR

## 2015-10-12 MED ORDER — TIMOLOL MALEATE 0.5 % OP SOLN
OPHTHALMIC | Status: DC | PRN
  Administered 2015-10-12: 1 [drp] via OPHTHALMIC

## 2015-10-12 MED ORDER — POVIDONE-IODINE 5 % OP SOLN
1.0000 "application " | OPHTHALMIC | Status: DC | PRN
  Administered 2015-10-12: 1 via OPHTHALMIC

## 2015-10-12 MED ORDER — BRIMONIDINE TARTRATE 0.2 % OP SOLN
OPHTHALMIC | Status: DC | PRN
  Administered 2015-10-12: 1 [drp] via OPHTHALMIC

## 2015-10-12 MED ORDER — MIDAZOLAM HCL 2 MG/2ML IJ SOLN
INTRAMUSCULAR | Status: DC | PRN
  Administered 2015-10-12: 2 mg via INTRAVENOUS

## 2015-10-12 MED ORDER — FENTANYL CITRATE (PF) 100 MCG/2ML IJ SOLN
INTRAMUSCULAR | Status: DC | PRN
  Administered 2015-10-12: 50 ug via INTRAVENOUS

## 2015-10-12 MED ORDER — TETRACAINE HCL 0.5 % OP SOLN
OPHTHALMIC | Status: DC | PRN
  Administered 2015-10-12: 2 [drp] via OPHTHALMIC

## 2015-10-12 SURGICAL SUPPLY — 28 items
APPLICATOR COTTON TIP 3IN (MISCELLANEOUS) ×2 IMPLANT
CANNULA ANT/CHMB 27GA (MISCELLANEOUS) ×2 IMPLANT
DISSECTOR HYDRO NUCLEUS 50X22 (MISCELLANEOUS) ×2 IMPLANT
GLOVE BIO SURGEON STRL SZ7 (GLOVE) ×2 IMPLANT
GLOVE SURG LX 6.5 MICRO (GLOVE) ×1
GLOVE SURG LX STRL 6.5 MICRO (GLOVE) ×1 IMPLANT
GOWN STRL REUS W/ TWL LRG LVL3 (GOWN DISPOSABLE) ×2 IMPLANT
GOWN STRL REUS W/TWL LRG LVL3 (GOWN DISPOSABLE) ×2
LENS IOL ACRYSOF IQ 21.5 (Intraocular Lens) ×2 IMPLANT
MARKER SKIN DUAL TIP RULER LAB (MISCELLANEOUS) ×2 IMPLANT
NEEDLE FILTER BLUNT 18X 1/2SAF (NEEDLE) ×1
NEEDLE FILTER BLUNT 18X1 1/2 (NEEDLE) ×1 IMPLANT
PACK CATARACT BRASINGTON (MISCELLANEOUS) ×2 IMPLANT
PACK EYE AFTER SURG (MISCELLANEOUS) ×2 IMPLANT
PACK OPTHALMIC (MISCELLANEOUS) ×2 IMPLANT
RING MALYGIN 7.0 (MISCELLANEOUS) IMPLANT
SOL BAL SALT 15ML (MISCELLANEOUS)
SOLUTION BAL SALT 15ML (MISCELLANEOUS) IMPLANT
SUT ETHILON 10-0 CS-B-6CS-B-6 (SUTURE)
SUT VICRYL  9 0 (SUTURE)
SUT VICRYL 9 0 (SUTURE) IMPLANT
SUTURE EHLN 10-0 CS-B-6CS-B-6 (SUTURE) IMPLANT
SYR 3ML LL SCALE MARK (SYRINGE) ×2 IMPLANT
SYR TB 1ML LUER SLIP (SYRINGE) ×2 IMPLANT
WATER STERILE IRR 250ML POUR (IV SOLUTION) ×2 IMPLANT
WATER STERILE IRR 500ML POUR (IV SOLUTION) IMPLANT
WICK EYE OCUCEL (MISCELLANEOUS) IMPLANT
WIPE NON LINTING 3.25X3.25 (MISCELLANEOUS) ×2 IMPLANT

## 2015-10-12 NOTE — Transfer of Care (Signed)
Immediate Anesthesia Transfer of Care Note  Patient: Jessica BrightlyShirley D Zbikowski  Procedure(s) Performed: Procedure(s) with comments: CATARACT EXTRACTION PHACO AND INTRAOCULAR LENS PLACEMENT (IOC) right eye (Right) - DIABETIC - oral meds  Patient Location: PACU  Anesthesia Type: MAC  Level of Consciousness: awake, alert  and patient cooperative  Airway and Oxygen Therapy: Patient Spontanous Breathing and Patient connected to supplemental oxygen  Post-op Assessment: Post-op Vital signs reviewed, Patient's Cardiovascular Status Stable, Respiratory Function Stable, Patent Airway and No signs of Nausea or vomiting  Post-op Vital Signs: Reviewed and stable  Complications: No apparent anesthesia complications

## 2015-10-12 NOTE — Anesthesia Postprocedure Evaluation (Signed)
Anesthesia Post Note  Patient: Jessica Wheeler  Procedure(s) Performed: Procedure(s) (LRB): CATARACT EXTRACTION PHACO AND INTRAOCULAR LENS PLACEMENT (IOC) right eye (Right)  Patient location during evaluation: PACU Anesthesia Type: General Level of consciousness: awake and alert Pain management: pain level controlled Vital Signs Assessment: post-procedure vital signs reviewed and stable Respiratory status: spontaneous breathing, nonlabored ventilation, respiratory function stable and patient connected to nasal cannula oxygen Cardiovascular status: blood pressure returned to baseline and stable Postop Assessment: no signs of nausea or vomiting Anesthetic complications: no    Dorene GrebeMcCulloch, Macey Wurtz V

## 2015-10-12 NOTE — H&P (Signed)
H+P reviewed and is up to date, please see paper chart.  

## 2015-10-12 NOTE — Anesthesia Procedure Notes (Signed)
Procedure Name: MAC Performed by: Garlan Drewes Pre-anesthesia Checklist: Patient identified, Emergency Drugs available, Suction available, Timeout performed and Patient being monitored Patient Re-evaluated:Patient Re-evaluated prior to inductionOxygen Delivery Method: Nasal cannula Placement Confirmation: positive ETCO2     

## 2015-10-12 NOTE — Op Note (Signed)
Date of Surgery: 10/12/2015  PREOPERATIVE DIAGNOSES: Visually significant nuclear sclerotic cataract, right eye.  POSTOPERATIVE DIAGNOSES: Same  PROCEDURES PERFORMED: Cataract extraction with intraocular lens implant, right eye.  SURGEON: Devin GoingAnita P. Vin, M.D.  ANESTHESIA: MAC and topical  IMPLANTS: [id number] +21.5 D  Implant Name Type Inv. Item Serial No. Manufacturer Lot No. LRB No. Used  LENS IOL ACRYSOF IQ 21.5 - [id number] Intraocular Lens LENS IOL ACRYSOF IQ 21.5 [id number] ALCON   Right 1     COMPLICATIONS: None.  DESCRIPTION OF PROCEDURE: Therapeutic options were discussed with the patient preoperatively, including a discussion of risks and benefits of surgery. Informed consent was obtained. An IOL-Master and immersion biometry were used to take the lens measurements, and a dilated fundus exam was performed within 6 months of the surgical date.  The patient was premedicated and brought to the operating room and placed on the operating table in the supine position. After adequate anesthesia, the patient was prepped and draped in the usual sterile ophthalmic fashion. A wire lid speculum was inserted and the microscope was positioned. A Superblade was used to create a paracentesis site at the limbus and a small amount of dilute preservative free lidocaine was instilled into the anterior chamber, followed by dispersive viscoelastic. A clear corneal incision was created temporally using a 2.4 mm keratome blade. Capsulorrhexis was then performed. In situ phacoemulsification was performed.  Cortical material was removed with the irrigation-aspiration unit. Dispersive viscoelastic was instilled to open the capsular bag. A posterior chamber intraocular lens with the specifications above was inserted and positioned. Irrigation-aspiration was used to remove all viscoelastic. Cefuroxime 1cc was instilled into the anterior chamber, and the corneal incision was checked and found to be water tight.  The eyelid speculum was removed.  The operative eye was covered with protective goggles after instilling 1 drop of  brimonidine. The patient tolerated the procedure well. There were no complications.

## 2015-10-12 NOTE — Anesthesia Preprocedure Evaluation (Signed)
Anesthesia Evaluation  Patient identified by MRN, date of birth, ID band  Reviewed: Allergy & Precautions, H&P , NPO status , Patient's Chart, lab work & pertinent test results  Airway Mallampati: II  TM Distance: >3 FB Neck ROM: full    Dental  (+) Upper Dentures, Lower Dentures   Pulmonary former smoker,    Pulmonary exam normal        Cardiovascular hypertension, + CAD and + Peripheral Vascular Disease   Rhythm:regular Rate:Normal     Neuro/Psych    GI/Hepatic   Endo/Other  diabetes  Renal/GU      Musculoskeletal   Abdominal   Peds  Hematology   Anesthesia Other Findings   Reproductive/Obstetrics                             Anesthesia Physical Anesthesia Plan  ASA: III  Anesthesia Plan: MAC   Post-op Pain Management:    Induction:   Airway Management Planned:   Additional Equipment:   Intra-op Plan:   Post-operative Plan:   Informed Consent: I have reviewed the patients History and Physical, chart, labs and discussed the procedure including the risks, benefits and alternatives for the proposed anesthesia with the patient or authorized representative who has indicated his/her understanding and acceptance.     Plan Discussed with: CRNA  Anesthesia Plan Comments:         Anesthesia Quick Evaluation

## 2015-10-13 ENCOUNTER — Encounter: Payer: Self-pay | Admitting: Ophthalmology

## 2015-10-13 ENCOUNTER — Other Ambulatory Visit: Payer: Self-pay | Admitting: *Deleted

## 2015-10-13 ENCOUNTER — Telehealth: Payer: Self-pay | Admitting: *Deleted

## 2015-10-13 DIAGNOSIS — I10 Essential (primary) hypertension: Secondary | ICD-10-CM

## 2015-10-13 MED ORDER — AMILORIDE-HYDROCHLOROTHIAZIDE 5-50 MG PO TABS: 1.0000 | ORAL_TABLET | Freq: Every day | ORAL | Status: DC

## 2015-10-13 NOTE — Telephone Encounter (Signed)
You have seen this patient once in May, 22.  Please advise refill? thanks

## 2015-10-13 NOTE — Telephone Encounter (Signed)
Patient has requested to have a medication refill for moduretic  [place] [facility] [place]

## 2015-10-13 NOTE — Telephone Encounter (Signed)
Patient called regarding her payment that she sent in the mail. It was check number [id number] for the amount of $71.56. She made a mistake and that check was suppose to go to [employer] and the check she sent them was suppose to go to us for the amount of $13.78. Patient wanted to know if she can get the refund sent back to her in the mail.

## 2015-10-13 NOTE — Telephone Encounter (Signed)
Please call patient and let her know the refund is already in the works.  It takes about 3-4 weeks, but it was entered on 6/6.

## 2015-10-13 NOTE — Telephone Encounter (Signed)
No order in pt chart

## 2015-10-13 NOTE — Telephone Encounter (Signed)
Please schedule for a lab appt in 2-3 weeks, thanks

## 2015-10-13 NOTE — Telephone Encounter (Signed)
Can you please order labs thanks

## 2015-10-13 NOTE — Telephone Encounter (Signed)
Refill sent in. Patient needs an appointment for a BMP in the next several weeks. Thanks.

## 2015-10-14 NOTE — Telephone Encounter (Signed)
Patient is aware that refund is in process.

## 2015-10-15 NOTE — Telephone Encounter (Signed)
Labs ordered.

## 2015-10-16 NOTE — Telephone Encounter (Signed)
I called pt to schedule her for a BMP lab appt, pt stated that [facility] Pt says they will send a copy of those labs.

## 2015-10-17 ENCOUNTER — Other Ambulatory Visit: Payer: Self-pay | Admitting: Family Medicine

## 2015-10-17 DIAGNOSIS — L304 Erythema intertrigo: Secondary | ICD-10-CM | POA: Diagnosis not present

## 2015-10-17 NOTE — Telephone Encounter (Signed)
Has changed PCP's to [place]. Allene DillonEmily Drozdowski, CMA

## 2015-10-29 ENCOUNTER — Other Ambulatory Visit: Payer: Self-pay | Admitting: Family Medicine

## 2015-10-29 DIAGNOSIS — E1169 Type 2 diabetes mellitus with other specified complication: Secondary | ICD-10-CM

## 2015-11-09 DIAGNOSIS — M546 Pain in thoracic spine: Secondary | ICD-10-CM | POA: Diagnosis not present

## 2015-11-09 DIAGNOSIS — M9902 Segmental and somatic dysfunction of thoracic region: Secondary | ICD-10-CM | POA: Diagnosis not present

## 2015-11-09 DIAGNOSIS — M9901 Segmental and somatic dysfunction of cervical region: Secondary | ICD-10-CM | POA: Diagnosis not present

## 2015-11-09 DIAGNOSIS — M5136 Other intervertebral disc degeneration, lumbar region: Secondary | ICD-10-CM | POA: Diagnosis not present

## 2015-11-09 DIAGNOSIS — M545 Low back pain: Secondary | ICD-10-CM | POA: Diagnosis not present

## 2015-11-09 DIAGNOSIS — M542 Cervicalgia: Secondary | ICD-10-CM | POA: Diagnosis not present

## 2015-11-09 DIAGNOSIS — M5030 Other cervical disc degeneration, unspecified cervical region: Secondary | ICD-10-CM | POA: Diagnosis not present

## 2015-11-09 DIAGNOSIS — M9903 Segmental and somatic dysfunction of lumbar region: Secondary | ICD-10-CM | POA: Diagnosis not present

## 2015-11-09 DIAGNOSIS — M5134 Other intervertebral disc degeneration, thoracic region: Secondary | ICD-10-CM | POA: Diagnosis not present

## 2015-11-10 DIAGNOSIS — M545 Low back pain: Secondary | ICD-10-CM | POA: Diagnosis not present

## 2015-11-10 DIAGNOSIS — M5136 Other intervertebral disc degeneration, lumbar region: Secondary | ICD-10-CM | POA: Diagnosis not present

## 2015-11-10 DIAGNOSIS — M9902 Segmental and somatic dysfunction of thoracic region: Secondary | ICD-10-CM | POA: Diagnosis not present

## 2015-11-10 DIAGNOSIS — M5030 Other cervical disc degeneration, unspecified cervical region: Secondary | ICD-10-CM | POA: Diagnosis not present

## 2015-11-10 DIAGNOSIS — M9901 Segmental and somatic dysfunction of cervical region: Secondary | ICD-10-CM | POA: Diagnosis not present

## 2015-11-10 DIAGNOSIS — M9903 Segmental and somatic dysfunction of lumbar region: Secondary | ICD-10-CM | POA: Diagnosis not present

## 2015-11-10 DIAGNOSIS — M542 Cervicalgia: Secondary | ICD-10-CM | POA: Diagnosis not present

## 2015-11-10 DIAGNOSIS — M5134 Other intervertebral disc degeneration, thoracic region: Secondary | ICD-10-CM | POA: Diagnosis not present

## 2015-11-10 DIAGNOSIS — M546 Pain in thoracic spine: Secondary | ICD-10-CM | POA: Diagnosis not present

## 2015-11-11 DIAGNOSIS — M5136 Other intervertebral disc degeneration, lumbar region: Secondary | ICD-10-CM | POA: Diagnosis not present

## 2015-11-11 DIAGNOSIS — M5134 Other intervertebral disc degeneration, thoracic region: Secondary | ICD-10-CM | POA: Diagnosis not present

## 2015-11-11 DIAGNOSIS — M546 Pain in thoracic spine: Secondary | ICD-10-CM | POA: Diagnosis not present

## 2015-11-11 DIAGNOSIS — M542 Cervicalgia: Secondary | ICD-10-CM | POA: Diagnosis not present

## 2015-11-11 DIAGNOSIS — M545 Low back pain: Secondary | ICD-10-CM | POA: Diagnosis not present

## 2015-11-11 DIAGNOSIS — M9903 Segmental and somatic dysfunction of lumbar region: Secondary | ICD-10-CM | POA: Diagnosis not present

## 2015-11-11 DIAGNOSIS — M9902 Segmental and somatic dysfunction of thoracic region: Secondary | ICD-10-CM | POA: Diagnosis not present

## 2015-11-11 DIAGNOSIS — M9901 Segmental and somatic dysfunction of cervical region: Secondary | ICD-10-CM | POA: Diagnosis not present

## 2015-11-11 DIAGNOSIS — M5030 Other cervical disc degeneration, unspecified cervical region: Secondary | ICD-10-CM | POA: Diagnosis not present

## 2015-11-13 DIAGNOSIS — M545 Low back pain: Secondary | ICD-10-CM | POA: Diagnosis not present

## 2015-11-13 DIAGNOSIS — M9902 Segmental and somatic dysfunction of thoracic region: Secondary | ICD-10-CM | POA: Diagnosis not present

## 2015-11-13 DIAGNOSIS — M9903 Segmental and somatic dysfunction of lumbar region: Secondary | ICD-10-CM | POA: Diagnosis not present

## 2015-11-13 DIAGNOSIS — M5030 Other cervical disc degeneration, unspecified cervical region: Secondary | ICD-10-CM | POA: Diagnosis not present

## 2015-11-13 DIAGNOSIS — M9901 Segmental and somatic dysfunction of cervical region: Secondary | ICD-10-CM | POA: Diagnosis not present

## 2015-11-13 DIAGNOSIS — M546 Pain in thoracic spine: Secondary | ICD-10-CM | POA: Diagnosis not present

## 2015-11-13 DIAGNOSIS — M5136 Other intervertebral disc degeneration, lumbar region: Secondary | ICD-10-CM | POA: Diagnosis not present

## 2015-11-13 DIAGNOSIS — Z961 Presence of intraocular lens: Secondary | ICD-10-CM | POA: Diagnosis not present

## 2015-11-13 DIAGNOSIS — M542 Cervicalgia: Secondary | ICD-10-CM | POA: Diagnosis not present

## 2015-11-13 DIAGNOSIS — M5134 Other intervertebral disc degeneration, thoracic region: Secondary | ICD-10-CM | POA: Diagnosis not present

## 2015-11-16 DIAGNOSIS — M9901 Segmental and somatic dysfunction of cervical region: Secondary | ICD-10-CM | POA: Diagnosis not present

## 2015-11-16 DIAGNOSIS — M9903 Segmental and somatic dysfunction of lumbar region: Secondary | ICD-10-CM | POA: Diagnosis not present

## 2015-11-16 DIAGNOSIS — M9902 Segmental and somatic dysfunction of thoracic region: Secondary | ICD-10-CM | POA: Diagnosis not present

## 2015-11-16 DIAGNOSIS — M5134 Other intervertebral disc degeneration, thoracic region: Secondary | ICD-10-CM | POA: Diagnosis not present

## 2015-11-16 DIAGNOSIS — M546 Pain in thoracic spine: Secondary | ICD-10-CM | POA: Diagnosis not present

## 2015-11-16 DIAGNOSIS — M5030 Other cervical disc degeneration, unspecified cervical region: Secondary | ICD-10-CM | POA: Diagnosis not present

## 2015-11-16 DIAGNOSIS — M5136 Other intervertebral disc degeneration, lumbar region: Secondary | ICD-10-CM | POA: Diagnosis not present

## 2015-11-16 DIAGNOSIS — M545 Low back pain: Secondary | ICD-10-CM | POA: Diagnosis not present

## 2015-11-16 DIAGNOSIS — M542 Cervicalgia: Secondary | ICD-10-CM | POA: Diagnosis not present

## 2015-11-18 DIAGNOSIS — M542 Cervicalgia: Secondary | ICD-10-CM | POA: Diagnosis not present

## 2015-11-18 DIAGNOSIS — M9902 Segmental and somatic dysfunction of thoracic region: Secondary | ICD-10-CM | POA: Diagnosis not present

## 2015-11-18 DIAGNOSIS — M5030 Other cervical disc degeneration, unspecified cervical region: Secondary | ICD-10-CM | POA: Diagnosis not present

## 2015-11-18 DIAGNOSIS — M545 Low back pain: Secondary | ICD-10-CM | POA: Diagnosis not present

## 2015-11-18 DIAGNOSIS — M5136 Other intervertebral disc degeneration, lumbar region: Secondary | ICD-10-CM | POA: Diagnosis not present

## 2015-11-18 DIAGNOSIS — M546 Pain in thoracic spine: Secondary | ICD-10-CM | POA: Diagnosis not present

## 2015-11-18 DIAGNOSIS — M9903 Segmental and somatic dysfunction of lumbar region: Secondary | ICD-10-CM | POA: Diagnosis not present

## 2015-11-18 DIAGNOSIS — M5134 Other intervertebral disc degeneration, thoracic region: Secondary | ICD-10-CM | POA: Diagnosis not present

## 2015-11-18 DIAGNOSIS — M9901 Segmental and somatic dysfunction of cervical region: Secondary | ICD-10-CM | POA: Diagnosis not present

## 2015-11-20 DIAGNOSIS — M5136 Other intervertebral disc degeneration, lumbar region: Secondary | ICD-10-CM | POA: Diagnosis not present

## 2015-11-20 DIAGNOSIS — M9901 Segmental and somatic dysfunction of cervical region: Secondary | ICD-10-CM | POA: Diagnosis not present

## 2015-11-20 DIAGNOSIS — M5030 Other cervical disc degeneration, unspecified cervical region: Secondary | ICD-10-CM | POA: Diagnosis not present

## 2015-11-20 DIAGNOSIS — M9902 Segmental and somatic dysfunction of thoracic region: Secondary | ICD-10-CM | POA: Diagnosis not present

## 2015-11-20 DIAGNOSIS — M9903 Segmental and somatic dysfunction of lumbar region: Secondary | ICD-10-CM | POA: Diagnosis not present

## 2015-11-20 DIAGNOSIS — M545 Low back pain: Secondary | ICD-10-CM | POA: Diagnosis not present

## 2015-11-20 DIAGNOSIS — M5134 Other intervertebral disc degeneration, thoracic region: Secondary | ICD-10-CM | POA: Diagnosis not present

## 2015-11-20 DIAGNOSIS — M546 Pain in thoracic spine: Secondary | ICD-10-CM | POA: Diagnosis not present

## 2015-11-20 DIAGNOSIS — M542 Cervicalgia: Secondary | ICD-10-CM | POA: Diagnosis not present

## 2015-11-23 DIAGNOSIS — M9901 Segmental and somatic dysfunction of cervical region: Secondary | ICD-10-CM | POA: Diagnosis not present

## 2015-11-23 DIAGNOSIS — M5136 Other intervertebral disc degeneration, lumbar region: Secondary | ICD-10-CM | POA: Diagnosis not present

## 2015-11-23 DIAGNOSIS — M5134 Other intervertebral disc degeneration, thoracic region: Secondary | ICD-10-CM | POA: Diagnosis not present

## 2015-11-23 DIAGNOSIS — M9903 Segmental and somatic dysfunction of lumbar region: Secondary | ICD-10-CM | POA: Diagnosis not present

## 2015-11-23 DIAGNOSIS — M5030 Other cervical disc degeneration, unspecified cervical region: Secondary | ICD-10-CM | POA: Diagnosis not present

## 2015-11-23 DIAGNOSIS — M9902 Segmental and somatic dysfunction of thoracic region: Secondary | ICD-10-CM | POA: Diagnosis not present

## 2015-11-23 DIAGNOSIS — M542 Cervicalgia: Secondary | ICD-10-CM | POA: Diagnosis not present

## 2015-11-23 DIAGNOSIS — M546 Pain in thoracic spine: Secondary | ICD-10-CM | POA: Diagnosis not present

## 2015-11-23 DIAGNOSIS — M545 Low back pain: Secondary | ICD-10-CM | POA: Diagnosis not present

## 2015-11-26 ENCOUNTER — Ambulatory Visit (INDEPENDENT_AMBULATORY_CARE_PROVIDER_SITE_OTHER): Payer: PPO

## 2015-11-26 ENCOUNTER — Encounter: Payer: Self-pay | Admitting: Family Medicine

## 2015-11-26 ENCOUNTER — Ambulatory Visit (INDEPENDENT_AMBULATORY_CARE_PROVIDER_SITE_OTHER): Payer: PPO | Admitting: Family Medicine

## 2015-11-26 VITALS — BP 130/64 | HR 78 | Temp 97.8°F | Wt 143.6 lb

## 2015-11-26 DIAGNOSIS — N3001 Acute cystitis with hematuria: Secondary | ICD-10-CM | POA: Diagnosis not present

## 2015-11-26 DIAGNOSIS — M25569 Pain in unspecified knee: Secondary | ICD-10-CM | POA: Insufficient documentation

## 2015-11-26 DIAGNOSIS — M25562 Pain in left knee: Secondary | ICD-10-CM | POA: Diagnosis not present

## 2015-11-26 DIAGNOSIS — M179 Osteoarthritis of knee, unspecified: Secondary | ICD-10-CM | POA: Diagnosis not present

## 2015-11-26 DIAGNOSIS — R300 Dysuria: Secondary | ICD-10-CM | POA: Diagnosis not present

## 2015-11-26 LAB — URINALYSIS, MICROSCOPIC ONLY

## 2015-11-26 LAB — POCT URINALYSIS DIPSTICK
Bilirubin, UA: NEGATIVE
Blood, UA: NEGATIVE
Glucose, UA: NEGATIVE
Ketones, UA: NEGATIVE
Spec Grav, UA: 1.025
Urobilinogen, UA: 0.2
pH, UA: 6

## 2015-11-26 MED ORDER — CIPROFLOXACIN HCL 500 MG PO TABS: 500.0000 mg | ORAL_TABLET | Freq: Two times a day (BID) | ORAL | 0 refills | Status: DC

## 2015-11-26 NOTE — Assessment & Plan Note (Signed)
Patient's symptoms and UA most consistent with urinary infection. With her CVA tenderness and feeling poorly could be pyelonephritis. Given symptoms we will cover with ciprofloxacin 500 mg twice daily for 7 days. Creatinine clearance should tolerate this. We will send urine for culture and microscopy. She is given return precautions.

## 2015-11-26 NOTE — Progress Notes (Signed)
Marikay Alar, MD Phone: [phone]  Jessica Wheeler is a 75 y.o. female who presents today for same-day visit.  Patient notes 1-2 days of dysuria, urinary frequency, and urinary urgency. Note some chills yesterday. Some mild back discomfort with this. No fevers or abdominal pain. No sweats. Has a history of UTIs and pyelonephritis in the past. She just does not feel well. Notes some scattered joint aches with this. Notes mostly her left knee bothers her though this has been going on since prior to her current symptoms. Notes her left knee hurts at night and with walking. Occasionally swells. No warmth or erythema. Takes a single 200 mg Advil daily for this. Left knee does not give out on her.  PMH: Former smoker   ROS see history of present illness  Objective  Physical Exam Vitals:   11/26/15 1036  BP: 130/64  Pulse: 78  Temp: 97.8 F (36.6 C)    BP Readings from Last 3 Encounters:  11/26/15 130/64  10/12/15 (!) 153/66  09/21/15 (!) 112/54   Wt Readings from Last 3 Encounters:  11/26/15 143 lb 9.6 oz (65.1 kg)  10/12/15 141 lb 12.8 oz (64.3 kg)  09/21/15 141 lb (64 kg)    Physical Exam  Constitutional: No distress.  HENT:  Head: Normocephalic and atraumatic.  Cardiovascular: Normal rate, regular rhythm and normal heart sounds.   Pulmonary/Chest: Effort normal and breath sounds normal.  Abdominal: Soft. Bowel sounds are normal. She exhibits no distension. There is tenderness (minimal suprapubic tenderness). There is no rebound and no guarding.  Mild left CVA tenderness  Musculoskeletal:  Left knee with no swelling or erythema, minimal medial and lateral tenderness, mild discomfort on McMurray's, no ligamentous laxity, right knee with no swelling or erythema or tenderness  Neurological: She is alert. Gait normal.  5 out of 5 strength bilateral quads, hamstrings, plantar flexion, and dorsiflexion, sensation to light touch intact in bilateral lower extremities, 2+  patellar reflex  Skin: Skin is warm and dry. She is not diaphoretic.     Assessment/Plan: Please see individual problem list.  UTI (urinary tract infection) Patient's symptoms and UA most consistent with urinary infection. With her CVA tenderness and feeling poorly could be pyelonephritis. Given symptoms we will cover with ciprofloxacin 500 mg twice daily for 7 days. Creatinine clearance should tolerate this. We will send urine for culture and microscopy. She is given return precautions.  Left knee pain Chronic issue. Suspect osteoarthritis as cause. Currently taking Advil. Relatively benign exam today. We will obtain an x-ray of her left knee to evaluate further. She will continue to monitor.    Orders Placed This Encounter  Procedures  . Urine Culture  . DG Knee 3 Views Left    Standing Status:   Future    Number of Occurrences:   1    Standing Expiration Date:   01/26/2017    Order Specific Question:   Reason for Exam (SYMPTOM  OR DIAGNOSIS REQUIRED)    Answer:   Pain    Order Specific Question:   Preferred imaging location?    Answer:   [facility]  . Urine Microscopic Only  . POCT Urinalysis Dipstick    Meds ordered this encounter  Medications  . ciprofloxacin (CIPRO) 500 MG tablet    Sig: Take 1 tablet (500 mg total) by mouth 2 (two) times daily.    Dispense:  14 tablet    Refill:  0    Marikay Alar, MD [facility] -  [employer]

## 2015-11-26 NOTE — Patient Instructions (Signed)
Nice to see you. You likely have a urine infection and possibly have a kidney infection. We will put you on ciprofloxacin to treat this. We will send her urine for culture to confirm infection. We'll x-ray her left knee. If you develop abdominal pain, fevers, chills, sweats, or any new or change in symptoms please seek medical attention.

## 2015-11-26 NOTE — Assessment & Plan Note (Signed)
Chronic issue. Suspect osteoarthritis as cause. Currently taking Advil. Relatively benign exam today. We will obtain an x-ray of her left knee to evaluate further. She will continue to monitor.

## 2015-11-27 ENCOUNTER — Telehealth: Payer: Self-pay | Admitting: Family Medicine

## 2015-11-27 DIAGNOSIS — I739 Peripheral vascular disease, unspecified: Secondary | ICD-10-CM | POA: Diagnosis not present

## 2015-11-27 DIAGNOSIS — M79609 Pain in unspecified limb: Secondary | ICD-10-CM | POA: Diagnosis not present

## 2015-11-27 DIAGNOSIS — I714 Abdominal aortic aneurysm, without rupture: Secondary | ICD-10-CM | POA: Diagnosis not present

## 2015-11-27 DIAGNOSIS — I10 Essential (primary) hypertension: Secondary | ICD-10-CM | POA: Diagnosis not present

## 2015-11-27 DIAGNOSIS — E119 Type 2 diabetes mellitus without complications: Secondary | ICD-10-CM | POA: Diagnosis not present

## 2015-11-27 DIAGNOSIS — R109 Unspecified abdominal pain: Secondary | ICD-10-CM | POA: Diagnosis not present

## 2015-11-27 DIAGNOSIS — I70213 Atherosclerosis of native arteries of extremities with intermittent claudication, bilateral legs: Secondary | ICD-10-CM | POA: Diagnosis not present

## 2015-11-27 DIAGNOSIS — I700 Atherosclerosis of aorta: Secondary | ICD-10-CM | POA: Diagnosis not present

## 2015-11-27 NOTE — Telephone Encounter (Signed)
Pt dropped off a copy of test results. She thought Dr. Birdie Sons might want to see them. Results are placed up front in Dr. Purvis Sheffield file folder.

## 2015-11-27 NOTE — Telephone Encounter (Signed)
Results given to Dr. Birdie Sons

## 2015-11-29 LAB — URINE CULTURE: Colony Count: >=100000

## 2015-11-30 ENCOUNTER — Telehealth: Payer: Self-pay | Admitting: Family Medicine

## 2015-11-30 DIAGNOSIS — Z5181 Encounter for therapeutic drug level monitoring: Secondary | ICD-10-CM

## 2015-11-30 NOTE — Telephone Encounter (Signed)
Patient could consider injections in her knee through our office or sports medicine. If she can come and do some lab work to check her kidneys we could consider a prescription strength anti-inflammatory as well.

## 2015-11-30 NOTE — Telephone Encounter (Signed)
Pt called about wanting to know what's the next step after knee xray. Pt states she is still in a lot pf pain and advil is not touching it. Please advise?  Call pt @ [phone]. Thank you!

## 2015-11-30 NOTE — Telephone Encounter (Signed)
Spoke with the patient, she would like the labs and temporary antiinflammatory medication to try first then if needed an injection.  She is hoping it is a temporary thing.  Scheduled for labs tomorrow afternoon. thanks

## 2015-11-30 NOTE — Telephone Encounter (Signed)
Please advise, I see reference to a Mychart message, but I don't see it, patient per note taking OTC pain meds only, thanks

## 2015-12-01 ENCOUNTER — Other Ambulatory Visit (INDEPENDENT_AMBULATORY_CARE_PROVIDER_SITE_OTHER): Payer: PPO

## 2015-12-01 DIAGNOSIS — Z5181 Encounter for therapeutic drug level monitoring: Secondary | ICD-10-CM | POA: Diagnosis not present

## 2015-12-01 LAB — BASIC METABOLIC PANEL
BUN: 17 mg/dL (ref 6–23)
CO2: 27 mEq/L (ref 19–32)
Calcium: 9.6 mg/dL (ref 8.4–10.5)
Chloride: 92 mEq/L — ABNORMAL LOW (ref 96–112)
Creatinine, Ser: 1.06 mg/dL (ref 0.40–1.20)
GFR: 53.45 mL/min — ABNORMAL LOW (ref >60.00)
Glucose, Bld: 138 mg/dL — ABNORMAL HIGH (ref 70–99)
Potassium: 3.2 mEq/L — ABNORMAL LOW (ref 3.5–5.1)
Sodium: 129 mEq/L — ABNORMAL LOW (ref 135–145)

## 2015-12-01 NOTE — Telephone Encounter (Signed)
Labs ordered. Please schedule her for a lab appointment. Will need this before I can send in anti-inflammatories. Thanks.

## 2015-12-01 NOTE — Telephone Encounter (Signed)
Labs are today, thanks

## 2015-12-02 ENCOUNTER — Telehealth: Payer: Self-pay | Admitting: *Deleted

## 2015-12-02 ENCOUNTER — Other Ambulatory Visit: Payer: Self-pay | Admitting: Family Medicine

## 2015-12-02 DIAGNOSIS — E871 Hypo-osmolality and hyponatremia: Secondary | ICD-10-CM

## 2015-12-02 MED ORDER — POTASSIUM CHLORIDE CRYS ER 20 MEQ PO TBCR: 40.0000 meq | EXTENDED_RELEASE_TABLET | Freq: Every day | ORAL | 0 refills | Status: DC

## 2015-12-02 NOTE — Telephone Encounter (Signed)
Spoke with the patient, see result note. thanks 

## 2015-12-02 NOTE — Telephone Encounter (Signed)
Patient request lab results from 12/01/15

## 2015-12-04 ENCOUNTER — Other Ambulatory Visit (INDEPENDENT_AMBULATORY_CARE_PROVIDER_SITE_OTHER): Payer: PPO

## 2015-12-04 DIAGNOSIS — E871 Hypo-osmolality and hyponatremia: Secondary | ICD-10-CM

## 2015-12-04 LAB — BASIC METABOLIC PANEL
BUN: 12 mg/dL (ref 6–23)
CO2: 26 mEq/L (ref 19–32)
Calcium: 9.5 mg/dL (ref 8.4–10.5)
Chloride: 96 mEq/L (ref 96–112)
Creatinine, Ser: 0.61 mg/dL (ref 0.40–1.20)
GFR: 101.13 mL/min (ref >60.00)
Glucose, Bld: 171 mg/dL — ABNORMAL HIGH (ref 70–99)
Potassium: 4 mEq/L (ref 3.5–5.1)
Sodium: 132 mEq/L — ABNORMAL LOW (ref 135–145)

## 2015-12-28 ENCOUNTER — Encounter: Payer: Self-pay | Admitting: Family Medicine

## 2015-12-28 ENCOUNTER — Ambulatory Visit (INDEPENDENT_AMBULATORY_CARE_PROVIDER_SITE_OTHER): Payer: PPO | Admitting: Family Medicine

## 2015-12-28 VITALS — BP 130/62 | HR 86 | Temp 98.3°F | Wt 144.8 lb

## 2015-12-28 DIAGNOSIS — I10 Essential (primary) hypertension: Secondary | ICD-10-CM | POA: Diagnosis not present

## 2015-12-28 DIAGNOSIS — N3001 Acute cystitis with hematuria: Secondary | ICD-10-CM | POA: Diagnosis not present

## 2015-12-28 DIAGNOSIS — M25562 Pain in left knee: Secondary | ICD-10-CM | POA: Diagnosis not present

## 2015-12-28 DIAGNOSIS — F329 Major depressive disorder, single episode, unspecified: Secondary | ICD-10-CM

## 2015-12-28 DIAGNOSIS — E785 Hyperlipidemia, unspecified: Secondary | ICD-10-CM

## 2015-12-28 DIAGNOSIS — F32A Depression, unspecified: Secondary | ICD-10-CM | POA: Insufficient documentation

## 2015-12-28 DIAGNOSIS — N898 Other specified noninflammatory disorders of vagina: Secondary | ICD-10-CM

## 2015-12-28 DIAGNOSIS — N9489 Other specified conditions associated with female genital organs and menstrual cycle: Secondary | ICD-10-CM | POA: Diagnosis not present

## 2015-12-28 LAB — URINALYSIS, MICROSCOPIC ONLY: RBC / HPF: NONE SEEN (ref 0–?)

## 2015-12-28 LAB — POCT URINALYSIS DIPSTICK
Bilirubin, UA: NEGATIVE
Blood, UA: NEGATIVE
Glucose, UA: NEGATIVE
Ketones, UA: NEGATIVE
Nitrite, UA: NEGATIVE
Protein, UA: NEGATIVE
Spec Grav, UA: 1.025
Urobilinogen, UA: 0.2
pH, UA: 6

## 2015-12-28 MED ORDER — CEPHALEXIN 500 MG PO CAPS: 500.0000 mg | ORAL_CAPSULE | Freq: Two times a day (BID) | ORAL | 0 refills | Status: DC

## 2015-12-28 NOTE — Patient Instructions (Signed)
Nice to see you. We are going to start you on an antibiotic called Keflex for UTI. We'll call when you're urine culture comes back. Please continue blood pressure medications. Please continue to walk for exercise. We will have you see sports medicine for your left knee pain. If you develop abdominal pain, fevers, worsening knee pain, leg swelling on one side or the other, chest pain, or shortness of breath, or any new or changing symptoms please seek medical attention.

## 2015-12-28 NOTE — Assessment & Plan Note (Signed)
No longer on medication. She'll continue exercise.

## 2015-12-28 NOTE — Assessment & Plan Note (Signed)
Patient's symptoms concerning for UTI. UA could be consistent with this. We will treat with Keflex and send urine for culture and microscopy. She's given return precautions.

## 2015-12-28 NOTE — Assessment & Plan Note (Signed)
Continues to be an issue. The location of her discomfort could be consistent with a Baker's cyst though there is no palpable abnormality. She has no unilateral swelling or exam findings to indicate DVT. Suspect related to her osteoarthritis and possible Baker cyst. We will refer to sports medicine for ultrasound.

## 2015-12-28 NOTE — Progress Notes (Signed)
Pre visit review using our clinic review tool, if applicable. No additional management support is needed unless otherwise documented below in the visit note. 

## 2015-12-28 NOTE — Progress Notes (Signed)
Jessica AlarEric Brandis Wixted, MD Phone: [phone]  [name] is a 75 y.o. female who presents today for follow-up.  Patient reports her urine just doesn't smell like it should. She notes some dysuria and some suprapubic pressure intermittently. Some frequency over the last several days. No vaginal discharge. No urgency. Notes she just doesn't feel like she should. Has some decreased energy. Does feel sad and has decreased interest in her normal activities which she notes is abnormal and thinks maybe this is related to having a urine infection. Does note some depression. No SI.  Left knee pain: Patient notes chronic left knee discomfort. Worse at night. Sharp pain in her posterior knee. No pain when on her leg. No pain during the day. No unilateral swelling. Had x-rays previously that revealed osteoarthritis.  HYPERTENSION  Disease Monitoring  Home BP Monitoring not checking Chest pain- no    Dyspnea- no Medications  Compliance-  taking amlodipine, losartan, and started back on combination pill with HCTZ at half a pill daily  Edema- no  HYPERLIPIDEMIA Symptoms See hypertension for review of systems Medications: Compliance- no medications currently She is walking for exercise. Typically walks a mile in about 19-22 minutes.   PMH: Former smoker   ROS see history of present illness  Objective  Physical Exam Vitals:   12/28/15 1051  BP: 130/62  Pulse: 86  Temp: 98.3 F (36.8 C)    BP Readings from Last 3 Encounters:  12/28/15 130/62  11/26/15 130/64  10/12/15 (!) 153/66   Wt Readings from Last 3 Encounters:  12/28/15 144 lb 12.8 oz (65.7 kg)  11/26/15 143 lb 9.6 oz (65.1 kg)  10/12/15 141 lb 12.8 oz (64.3 kg)    Physical Exam  Constitutional: No distress.  HENT:  Head: Normocephalic and atraumatic.  Cardiovascular: Normal rate, regular rhythm and normal heart sounds.   Pulmonary/Chest: Effort normal.  Abdominal: Soft. She exhibits no distension. There is no tenderness.  There is no rebound and no guarding.  Musculoskeletal:  Bilateral knees with no swelling, joint tenderness, erythema, or warmth, no posterior knee tenderness or swelling, bilateral calves no swelling, tenderness, or cords  Neurological: She is alert. Gait normal.  Skin: Skin is warm and dry. She is not diaphoretic.     Assessment/Plan: Please see individual problem list.  HTN (hypertension) At goal. Patient will call us with the name of the medication and dosage that she has started back taking so that we can place this on her medication list.  UTI (urinary tract infection) Patient's symptoms concerning for UTI. UA could be consistent with this. We will treat with Keflex and send urine for culture and microscopy. She's given return precautions.  Hyperlipidemia No longer on medication. She'll continue exercise.  Left knee pain Continues to be an issue. The location of her discomfort could be consistent with a Baker's cyst though there is no palpable abnormality. She has no unilateral swelling or exam findings to indicate DVT. Suspect related to her osteoarthritis and possible Baker cyst. We will refer to sports medicine for ultrasound.  Depression Patient's PHQ 9 consistent with depression. Her description of decreased energy, weight gain, and decreased interest in her normal activities are also consistent with depression. I discussed potential options of treating her current issues of her knee pain and possible UTI i and n seeing if her depressive symptoms improved versus starting a medication for depression and patient opted to treat her potential UTI and knee pain and proceeding from there. She'll continue to monitor  and if there is no improvement would need to consider treatment for depression.   Orders Placed This Encounter  Procedures  . Urine Culture  . Urine Microscopic Only  . Ambulatory referral to Sports Medicine    Referral Priority:   Routine    Referral Type:    Consultation    Number of Visits Requested:   1  . POCT Urinalysis Dipstick    Meds ordered this encounter  Medications  . cephALEXin (KEFLEX) 500 MG capsule    Sig: Take 1 capsule (500 mg total) by mouth 2 (two) times daily.    Dispense:  14 capsule    Refill:  0    Jessica Alar, MD [facility]

## 2015-12-28 NOTE — Assessment & Plan Note (Signed)
Patient's PHQ 9 consistent with depression. Her description of decreased energy, weight gain, and decreased interest in her normal activities are also consistent with depression. I discussed potential options of treating her current issues of her knee pain and possible UTI i and n seeing if her depressive symptoms improved versus starting a medication for depression and patient opted to treat her potential UTI and knee pain and proceeding from there. She'll continue to monitor and if there is no improvement would need to consider treatment for depression.

## 2015-12-28 NOTE — Assessment & Plan Note (Signed)
At goal. Patient will call us with the name of the medication and dosage that she has started back taking so that we can place this on her medication list.

## 2015-12-29 LAB — URINE CULTURE: Organism ID, Bacteria: NO GROWTH

## 2016-01-07 DIAGNOSIS — M1712 Unilateral primary osteoarthritis, left knee: Secondary | ICD-10-CM | POA: Diagnosis not present

## 2016-01-20 DIAGNOSIS — M25662 Stiffness of left knee, not elsewhere classified: Secondary | ICD-10-CM | POA: Diagnosis not present

## 2016-01-20 DIAGNOSIS — M25562 Pain in left knee: Secondary | ICD-10-CM | POA: Diagnosis not present

## 2016-01-22 DIAGNOSIS — M25662 Stiffness of left knee, not elsewhere classified: Secondary | ICD-10-CM | POA: Diagnosis not present

## 2016-01-22 DIAGNOSIS — M25562 Pain in left knee: Secondary | ICD-10-CM | POA: Diagnosis not present

## 2016-02-01 DIAGNOSIS — M25562 Pain in left knee: Secondary | ICD-10-CM | POA: Diagnosis not present

## 2016-02-03 DIAGNOSIS — M25562 Pain in left knee: Secondary | ICD-10-CM | POA: Diagnosis not present

## 2016-02-08 ENCOUNTER — Other Ambulatory Visit: Payer: Self-pay | Admitting: Family Medicine

## 2016-02-08 DIAGNOSIS — I10 Essential (primary) hypertension: Secondary | ICD-10-CM

## 2016-02-09 ENCOUNTER — Other Ambulatory Visit: Payer: Self-pay | Admitting: Family Medicine

## 2016-02-09 DIAGNOSIS — I10 Essential (primary) hypertension: Secondary | ICD-10-CM

## 2016-02-09 DIAGNOSIS — M25562 Pain in left knee: Secondary | ICD-10-CM | POA: Diagnosis not present

## 2016-02-09 NOTE — Telephone Encounter (Signed)
Please advise on refill. Previously filled by another provider.

## 2016-02-11 DIAGNOSIS — M25562 Pain in left knee: Secondary | ICD-10-CM | POA: Diagnosis not present

## 2016-02-17 ENCOUNTER — Other Ambulatory Visit: Payer: Self-pay

## 2016-02-17 DIAGNOSIS — Z1231 Encounter for screening mammogram for malignant neoplasm of breast: Secondary | ICD-10-CM

## 2016-03-07 ENCOUNTER — Telehealth: Payer: Self-pay | Admitting: Cardiovascular Disease

## 2016-03-07 NOTE — Telephone Encounter (Signed)
She needs an appt to come in to discuss w/ Dr. Mariah MillingGollan or  PA/NP.

## 2016-03-07 NOTE — Telephone Encounter (Signed)
Pt calling stating she is having some issues   She states she is having some joint issues, [facility] and she was told usually they were to take some type of  anti inflammatory mediation but her pcp told her she can't have any anti-flamatory medication due to her heart condition.  She states she is not even sure what heart issues she has.  Would like to know if there can be a drug change in her medication for her High BP  So that it can help with her joint issues  Please advise.

## 2016-03-08 NOTE — Telephone Encounter (Signed)
Lmov for patient to call back and schedule appointment with Dr Franki CabotGollan/NP/PA

## 2016-03-09 NOTE — Telephone Encounter (Signed)
Pt is coming tomorrow at 8:40 am to see Dr Mariah MillingGollan

## 2016-03-10 ENCOUNTER — Encounter: Payer: Self-pay | Admitting: Cardiovascular Disease

## 2016-03-10 ENCOUNTER — Ambulatory Visit (INDEPENDENT_AMBULATORY_CARE_PROVIDER_SITE_OTHER): Payer: PPO | Admitting: Cardiovascular Disease

## 2016-03-10 VITALS — BP 154/68 | HR 81 | Ht 63.0 in | Wt 145.5 lb

## 2016-03-10 DIAGNOSIS — M1712 Unilateral primary osteoarthritis, left knee: Secondary | ICD-10-CM

## 2016-03-10 DIAGNOSIS — E7800 Pure hypercholesterolemia, unspecified: Secondary | ICD-10-CM

## 2016-03-10 DIAGNOSIS — I10 Essential (primary) hypertension: Secondary | ICD-10-CM | POA: Diagnosis not present

## 2016-03-10 DIAGNOSIS — I2510 Atherosclerotic heart disease of native coronary artery without angina pectoris: Secondary | ICD-10-CM

## 2016-03-10 DIAGNOSIS — E1169 Type 2 diabetes mellitus with other specified complication: Secondary | ICD-10-CM

## 2016-03-10 DIAGNOSIS — I7102 Dissection of abdominal aorta: Secondary | ICD-10-CM

## 2016-03-10 MED ORDER — LOSARTAN POTASSIUM 100 MG PO TABS: 100.0000 mg | ORAL_TABLET | Freq: Every day | ORAL | 3 refills | Status: DC

## 2016-03-10 NOTE — Patient Instructions (Addendum)
Medication Instructions:   Please increase the losartan up to 100 mg daily Please increase the amlodipine up to 5 mg daily  If blood pressure runs high, call the office We could increase the amlodipine up to 10 mg daily  Labwork:  No new labs needed  Testing/Procedures:  No further testing at this time   Follow-Up: It was a pleasure seeing you in the office today. Please call us if you have new issues that need to be addressed before your next appt.  [phone]  Your physician wants you to follow-up in: 6 months.  You will receive a reminder letter in the mail two months in advance. If you don't receive a letter, please call our office to schedule the follow-up appointment.  If you need a refill on your cardiac medications before your next appointment, please call your pharmacy.

## 2016-03-10 NOTE — Progress Notes (Signed)
Cardiology Office Note  Date:  03/10/2016   ID:  Jessica Wheeler, DOB July 21, 1940, MRN [id number]  PCP:  Marikay AlarEric Sonnenberg, MD   Chief Complaint  Patient presents with  . other    Pt. c/o left knee pain and is in need of anti inflammatory; was told by PCP to contact her cardiologist to discuss if okay to get a Rx for anti inflammatory.  Meds reviewed by the pt. verbally.     HPI:  Jessica Wheeler is a very pleasant 75 year old woman with a history of diabetes, hypertension, smoking, mild coronary artery disease by cardiac catheterization in 2007 who presents for routine followup of her coronary artery disease.  history of aortic dissection following catheterization,  also with renal artery stenosis followed by Dr. Wyn Quakerew. Chronically low sodium.  Cardiac catheterization in 2007  showed 20% proximal RCA disease and 20% LAD disease.She has a short region of dissection in the distal aorta, seen on CT scan in 2010 On U/S in 2013: no dissection  In follow-up today, she reports having significant pain in her left knee, chronic issue Swollen up over the summer, loves to go walking on a track/treadmill 3 miles per day Disappointed that she cannot take NSAIDs for her pain Has been taking her amiloride HCTZ  Reports that she also had a UTI, Potassium was low, 3.2, follow up 4.0 after supplemental potassium She is back on her diuretic, not taking potassium  She did have Xray of her knee, osteoarthritis She prefers not to work out at the gym doing nonweightbearing exercises She continues to have knee pain at nighttime lasting at least 2 hours  Denies any chest pain concerning for angina, no significant shortness of breath  In general hemoglobin A1c in the 6 range Prior sodium 132,   Declined EKG on today's visit  Other past medical history reviewed severe reaction to pneumonia shot last year in 2015, still with hand weakness, joint problems. She had a rash initially that has resolved  Prior  history of shingles  Blood pressure has been up and down but in general within a reasonable range, sugars also climbing slightly. Regular exercise program every day.  Previous  Echocardiogram was performed that was normal  Stress test/Myoview was also normal showing no ischemia .   Previous  Lab work shows normal TSH, sodium 132, hemoglobin A1c 6.5  PMH:   has a past medical history of Arthritis; Bundle branch block, right; Diabetes mellitus; Diffuse cystic mastopathy; Dissection, aorta (HCC); Diverticulosis; Family history of malignant neoplasm of gastrointestinal tract; Glaucoma (2005); History of bronchitis; History of pancreatitis (2005); Hypertension (1982); Personal history of tobacco use, presenting hazards to health; and Ulcer (HCC).  PSH:    Past Surgical History:  Procedure Laterality Date  . ABDOMINAL HYSTERECTOMY  1991  . APPENDECTOMY    . BACK SURGERY  1982   lower surgery  . BLADDER SURGERY    . BREAST BIOPSY Right 1980's  . BREAST SURGERY     biopsy  . CARDIAC CATHETERIZATION     Dr. Lady GaryFath, few years ago  . CATARACT EXTRACTION W/PHACO Left 08/31/2015   Procedure: CATARACT EXTRACTION PHACO AND INTRAOCULAR LENS PLACEMENT (IOC);  Surgeon: Sherald HessAnita Prakash Vin-Parikh, MD;  [facility];  Service: Ophthalmology;  Laterality: Left;  DIABETIC - oral meds  . CATARACT EXTRACTION W/PHACO Right 10/12/2015   Procedure: CATARACT EXTRACTION PHACO AND INTRAOCULAR LENS PLACEMENT (IOC) right eye;  Surgeon: Sherald HessAnita Prakash Vin-Parikh, MD;  [facility];  Service: Ophthalmology;  Laterality: Right;  DIABETIC - oral meds  . CHOLECYSTECTOMY  1994  . COLONOSCOPY  2008,02/22/2012   Dr. Sankar-2013    Current Outpatient Prescriptions  Medication Sig Dispense Refill  . amLODipine (NORVASC) 5 MG tablet TAKE 1 TABLET BY MOUTH EVERY DAY 90 tablet 3  . aspirin 81 MG EC tablet Take 81 mg by mouth daily.      . clobetasol ointment (TEMOVATE) 0.05 % Reported on  10/12/2015    . Ibuprofen (ADVIL) 200 MG CAPS Take 1 capsule by mouth daily.      Marland Kitchen latanoprost (XALATAN) 0.005 % ophthalmic solution Reported on 10/12/2015  3  . losartan (COZAAR) 100 MG tablet Take 1 tablet (100 mg total) by mouth daily. 90 tablet 3  . metFORMIN (GLUCOPHAGE) 500 MG tablet TAKE 2 TABLETS BY MOUTH TWICE DAILY 360 tablet 1  . nitroGLYCERIN (NITROSTAT) 0.4 MG SL tablet Place 1 tablet (0.4 mg total) under the tongue every 5 (five) minutes as needed for chest pain. 25 tablet 3  . vitamin B-12 (CYANOCOBALAMIN) 1000 MCG tablet Take 1,000 mcg by mouth daily.     No current facility-administered medications for this visit.      Allergies:   Beta adrenergic blockers; Levaquin [levofloxacin in d5w]; Lisinopril; and Prevnar [pneumococcal 13-val conj vacc]   Social History:  The patient  reports that she quit smoking about 12 years ago. Her smoking use included Cigarettes. She has a 15.00 pack-year smoking history. She has never used smokeless tobacco. She reports that she does not drink alcohol or use drugs.   Family History:   family history includes Anxiety disorder in her mother; Breast cancer (age of onset: 71) in her cousin; Breast cancer (age of onset: 74) in her maternal aunt; Colon cancer in her brother; Diabetes in her mother; Heart attack in her father; Heart disease in her father; Hypertension in her father, maternal grandfather, maternal grandmother, and mother.    Review of Systems: Review of Systems  Constitutional: Negative.   Respiratory: Negative.   Cardiovascular: Negative.   Gastrointestinal: Negative.   Musculoskeletal: Positive for joint pain.  Neurological: Negative.   Psychiatric/Behavioral: Negative.   All other systems reviewed and are negative.    PHYSICAL EXAM: VS:  BP (!) 154/68 (BP Location: Left Arm, Patient Position: Sitting, Cuff Size: Normal)   Pulse 81   Ht 5\' 3"  (1.6 m)   Wt 145 lb 8 oz (66 kg)   BMI 25.77 kg/m  , BMI Body mass index is  25.77 kg/m. GEN: Well nourished, well developed, in no acute distress  HEENT: normal  Neck: no JVD, carotid bruits, or masses Cardiac: RRR; no murmurs, rubs, or gallops,no edema  Respiratory:  clear to auscultation bilaterally, normal work of breathing GI: soft, nontender, nondistended, + BS MS: no deformity or atrophy  Skin: warm and dry, no rash Neuro:  Strength and sensation are intact Psych: euthymic mood, full affect    Recent Labs: 12/04/2015: BUN 12; Creatinine, Ser 0.61; Potassium 4.0; Sodium 132    Lipid Panel No results found for: CHOL, HDL, LDLCALC, TRIG    Wt Readings from Last 3 Encounters:  03/10/16 145 lb 8 oz (66 kg)  12/28/15 144 lb 12.8 oz (65.7 kg)  11/26/15 143 lb 9.6 oz (65.1 kg)       ASSESSMENT AND PLAN:  Coronary artery disease involving native coronary artery of native heart without angina pectoris - Plan: CANCELED: EKG 12-Lead Currently with no symptoms of angina. No further workup at this  time. Continue current medication regimen.  Essential hypertension - Plan: losartan (COZAAR) 100 MG tablet, CANCELED: EKG 12-Lead Long discussion concerning her medications Recommended she hold the diuretic, amiloride HCTZ given low sodium, low potassium on recent lab work She is not taking potassium supplement She does not have signs of fluid retention, heart failure Suggested she increase losartan up to 100 mg daily, increase amlodipine back to 5 mg daily If blood pressure continues to run high, could potentially increase amlodipine up to 10 mg daily She does not have any leg swelling on today's visit  Pure hypercholesterolemia She will obtain lipid panel from Dr. Judithann SheenSparks. She had labs drawn recently. She will give us a copy for our records Currently not on a statin  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HCC) Not seen on ultrasound 2013, no further workup needed  Type 2 diabetes mellitus with other specified complication, without long-term current use of insulin  (HCC) We have encouraged continued exercise, careful diet management in an effort to lose weight.  Primary osteoarthritis of left knee Most of her recent symptoms are located around her left knee consistent with osteoarthritis She has repetitive injury, likes to walk daily 3 miles She has started icing her knee She would be acceptable risk for NSAIDs given minimal coronary disease in the past No dissection seen on ultrasound 2013 Options include meloxicam, Voltaren cream for both She has normal renal function Will for to primary care at her request  Disposition:   F/U  6 months  Long discussion about various medication options for her knee  Total encounter time more than 25 minutes  Greater than 50% was spent in counseling and coordination of care with the patient   No orders of the defined types were placed in this encounter.    Signed, Dossie Arbourim Tandy Grawe, M.D., Ph.D. 03/10/2016  [facility] [place] [phone]

## 2016-03-15 DIAGNOSIS — M545 Low back pain: Secondary | ICD-10-CM | POA: Diagnosis not present

## 2016-03-15 DIAGNOSIS — M5136 Other intervertebral disc degeneration, lumbar region: Secondary | ICD-10-CM | POA: Diagnosis not present

## 2016-03-15 DIAGNOSIS — M546 Pain in thoracic spine: Secondary | ICD-10-CM | POA: Diagnosis not present

## 2016-03-15 DIAGNOSIS — M5030 Other cervical disc degeneration, unspecified cervical region: Secondary | ICD-10-CM | POA: Diagnosis not present

## 2016-03-15 DIAGNOSIS — M9903 Segmental and somatic dysfunction of lumbar region: Secondary | ICD-10-CM | POA: Diagnosis not present

## 2016-03-15 DIAGNOSIS — M542 Cervicalgia: Secondary | ICD-10-CM | POA: Diagnosis not present

## 2016-03-15 DIAGNOSIS — M5134 Other intervertebral disc degeneration, thoracic region: Secondary | ICD-10-CM | POA: Diagnosis not present

## 2016-03-15 DIAGNOSIS — M9901 Segmental and somatic dysfunction of cervical region: Secondary | ICD-10-CM | POA: Diagnosis not present

## 2016-03-15 DIAGNOSIS — M9902 Segmental and somatic dysfunction of thoracic region: Secondary | ICD-10-CM | POA: Diagnosis not present

## 2016-03-17 DIAGNOSIS — H60391 Other infective otitis externa, right ear: Secondary | ICD-10-CM | POA: Diagnosis not present

## 2016-03-17 DIAGNOSIS — H6121 Impacted cerumen, right ear: Secondary | ICD-10-CM | POA: Diagnosis not present

## 2016-03-18 ENCOUNTER — Telehealth: Payer: Self-pay | Admitting: Family Medicine

## 2016-03-18 DIAGNOSIS — M5136 Other intervertebral disc degeneration, lumbar region: Secondary | ICD-10-CM | POA: Diagnosis not present

## 2016-03-18 DIAGNOSIS — M542 Cervicalgia: Secondary | ICD-10-CM | POA: Diagnosis not present

## 2016-03-18 DIAGNOSIS — M9902 Segmental and somatic dysfunction of thoracic region: Secondary | ICD-10-CM | POA: Diagnosis not present

## 2016-03-18 DIAGNOSIS — M5134 Other intervertebral disc degeneration, thoracic region: Secondary | ICD-10-CM | POA: Diagnosis not present

## 2016-03-18 DIAGNOSIS — M9903 Segmental and somatic dysfunction of lumbar region: Secondary | ICD-10-CM | POA: Diagnosis not present

## 2016-03-18 DIAGNOSIS — M9901 Segmental and somatic dysfunction of cervical region: Secondary | ICD-10-CM | POA: Diagnosis not present

## 2016-03-18 DIAGNOSIS — M5030 Other cervical disc degeneration, unspecified cervical region: Secondary | ICD-10-CM | POA: Diagnosis not present

## 2016-03-18 DIAGNOSIS — M546 Pain in thoracic spine: Secondary | ICD-10-CM | POA: Diagnosis not present

## 2016-03-18 DIAGNOSIS — M545 Low back pain: Secondary | ICD-10-CM | POA: Diagnosis not present

## 2016-03-18 DIAGNOSIS — R399 Unspecified symptoms and signs involving the genitourinary system: Secondary | ICD-10-CM | POA: Diagnosis not present

## 2016-03-18 NOTE — Telephone Encounter (Signed)
Poke with patient and advised that she be seen for her symptoms. Unfortunately there is no appointments available in the clinic so advised to be seen at a Walk in clinic or urgent care. Patient verbalized understanding

## 2016-03-18 NOTE — Telephone Encounter (Signed)
Pt called and thinks that she has an UTI and has taken the Uristat and it does not seem to be working. Does pt need to be seen, or can she drop off a urine sample? Please advise, thank you!  Call pt @ [phone]

## 2016-03-29 ENCOUNTER — Ambulatory Visit (INDEPENDENT_AMBULATORY_CARE_PROVIDER_SITE_OTHER): Payer: PPO | Admitting: Family Medicine

## 2016-03-29 ENCOUNTER — Encounter: Payer: Self-pay | Admitting: Family Medicine

## 2016-03-29 ENCOUNTER — Ambulatory Visit
Admission: RE | Admit: 2016-03-29 | Discharge: 2016-03-29 | Disposition: A | Discharge status: Home / Self Care | Payer: PPO | Source: Ambulatory Visit | Attending: Family Medicine | Admitting: Family Medicine

## 2016-03-29 ENCOUNTER — Telehealth: Payer: Self-pay

## 2016-03-29 VITALS — BP 130/70 | HR 78 | Temp 97.8°F | Wt 145.0 lb

## 2016-03-29 DIAGNOSIS — M79605 Pain in left leg: Secondary | ICD-10-CM

## 2016-03-29 DIAGNOSIS — E1169 Type 2 diabetes mellitus with other specified complication: Secondary | ICD-10-CM

## 2016-03-29 DIAGNOSIS — M25862 Other specified joint disorders, left knee: Secondary | ICD-10-CM | POA: Diagnosis not present

## 2016-03-29 DIAGNOSIS — N3001 Acute cystitis with hematuria: Secondary | ICD-10-CM

## 2016-03-29 DIAGNOSIS — M7989 Other specified soft tissue disorders: Secondary | ICD-10-CM | POA: Diagnosis not present

## 2016-03-29 DIAGNOSIS — M25562 Pain in left knee: Secondary | ICD-10-CM | POA: Diagnosis not present

## 2016-03-29 DIAGNOSIS — G8929 Other chronic pain: Secondary | ICD-10-CM

## 2016-03-29 MED ORDER — DICLOFENAC SODIUM 1 % TD GEL: 4.0000 g | Freq: Four times a day (QID) | TRANSDERMAL | 2 refills | Status: DC

## 2016-03-29 NOTE — Telephone Encounter (Signed)
Notify patient of result.

## 2016-03-29 NOTE — Assessment & Plan Note (Signed)
Continues to be an issue. Previously she had no noted swelling on exam though today has quite a difference between her left calf in her right calf. Also with a fullness behind her left knee. 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 If this is negative we will send in Voltaren gel for her trial for her knee discomfort. If this does not help we could consider meloxicam per review of the patient's cardiology note.

## 2016-03-29 NOTE — Assessment & Plan Note (Signed)
Patient treated with Cipro for recent UTI. Notes symptoms improved at this time. She'll monitor for recurrence.

## 2016-03-29 NOTE — Progress Notes (Signed)
Marikay AlarEric Gayle Collard, MD Phone: [phone]  Jessica BrightlyShirley D Wheeler is a 75 y.o. female who presents today for follow-up.  Patient notes continued left knee discomfort. She saw orthopedics and they sent her for therapy. They advised that she was not a candidate for steroid injections. She notes intermittent fluid buildup. She feels a fullness behind her left knee as well. Also notes her left lower leg swells at times. Improved in the morning. Notes it is a sharp shooting pain. Occasionally her knee will give out on her. X-ray revealed arthritic changes. No chest pain or shortness of breath.  Patient was treated for UTI with Cipro through the walk-in clinic. Notes she finished this last week. Notes her symptoms have improved.  Diabetes: Checking her blood sugars and they are ranging from 120-140 typically. Taking metformin. [facility]. No polyuria. Notes rare hypoglycemia if she does not eat anything.  PMH: Former smoker   ROS see history of present illness  Objective  Physical Exam Vitals:   03/29/16 1006  BP: 130/70  Pulse: 78  Temp: 97.8 F (36.6 C)    BP Readings from Last 3 Encounters:  03/29/16 130/70  03/10/16 (!) 154/68  12/28/15 130/62   Wt Readings from Last 3 Encounters:  03/29/16 145 lb (65.8 kg)  03/10/16 145 lb 8 oz (66 kg)  12/28/15 144 lb 12.8 oz (65.7 kg)    Physical Exam  Constitutional: No distress.  Cardiovascular: Normal rate, regular rhythm and normal heart sounds.   Pulmonary/Chest: Effort normal and breath sounds normal.  Musculoskeletal: She exhibits no edema.  Left knee with mild effusion, no joint line tenderness, no bony tenderness, no soft tissue tenderness, no warmth or erythema, there is a fullness in the left popliteal fossa, no tenderness of the calf on the left side, left calf measures 39 cm, right knee with no effusion, joint line tenderness, soft tissue tenderness, bony tenderness, warmth, or  erythema, no tenderness of the calf, right calf measures 34.5 cm, bilateral knees with no ligamentous laxity, bilateral knees with negative McMurray's  Neurological: She is alert. Gait normal.  Skin: Skin is warm and dry. She is not diaphoretic.     Assessment/Plan: Please see individual problem list.  DM (diabetes mellitus) Appears to be well controlled. She'll continue her metformin. She'll continue to get A[facility]c's through [facility].  Left knee pain Continues to be an issue. Previously she had no noted swelling on exam though today has quite a difference between her left calf in her right calf. Also with a fullness behind her left knee. 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 If this is negative we will send in Voltaren gel for her trial for her knee discomfort. If this does not help we could consider meloxicam per review of the patient's cardiology note.  UTI (urinary tract infection) Patient treated with Cipro for recent UTI. Notes symptoms improved at this time. She'll monitor for recurrence.   Orders Placed This Encounter  Procedures  . US Venous Img Lower Unilateral Left    Patient to wait until report called    Standing Status:   Future    Number of Occurrences:   1    Standing Expiration Date:   05/29/2017    Order Specific Question:   Reason for Exam (SYMPTOM  OR DIAGNOSIS REQUIRED)    Answer:  left LE pain and swelling, fullness posterior knee, left calf 39 cm, right calf 34.5 cm    Order Specific Question:   Preferred imaging location?    Answer:   [facility]    Order Specific Question:   Call Results- Best Contact Number?    Answer:   [phone]  ask for Jeral PinchJamie    Javonn Gauger, MD [facility]

## 2016-03-29 NOTE — Patient Instructions (Signed)
Nice to see you. We are going to obtain an ultrasound of your left leg. If there is a blood clot we'll treat with a blood thinner. If there is no blood clot we will treat with topical Voltaren. Please monitor for recurrence of your urinary tract infection. If this occurs please let us know. If you develop chest pain or shortness of breath please seek medical attention immediately.

## 2016-03-29 NOTE — Assessment & Plan Note (Signed)
Appears to be well controlled. She'll continue her metformin. She'll continue to get A[facility]c's through [facility].

## 2016-03-29 NOTE — Progress Notes (Signed)
Pre visit review using our clinic review tool, if applicable. No additional management support is needed unless otherwise documented below in the visit note. 

## 2016-03-29 NOTE — Telephone Encounter (Signed)
Spoke with patient and gave results of the US. Per Dr. 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 Patient would like to try the Voltaren Gel to start with and if this doesn't help will do Sports Medicine.

## 2016-03-29 NOTE — Telephone Encounter (Signed)
Voltaren gel already sent  to pharmacy.

## 2016-03-29 NOTE — Telephone Encounter (Signed)
[facility] Radiology Call Report /left lower extremity ultrasound negative for DVT however showed  Baker cyst .  See full detailed report in EPIC .

## 2016-04-16 ENCOUNTER — Other Ambulatory Visit: Payer: Self-pay | Admitting: Family Medicine

## 2016-04-16 DIAGNOSIS — I10 Essential (primary) hypertension: Secondary | ICD-10-CM

## 2016-04-18 NOTE — Telephone Encounter (Signed)
Left message to return call, chart states this has been discontinued. Last filled 10/13/15 90 1rf

## 2016-04-19 NOTE — Telephone Encounter (Signed)
Patient states she takes half a tab

## 2016-04-20 NOTE — Telephone Encounter (Signed)
Order placed

## 2016-04-20 NOTE — Telephone Encounter (Signed)
Patient states she was informed by the cardiologist to finish the meds she has she is on amlodipine 5mg  losartan 50mg  qd. Patient has not increased her meds. She also states she still takes the amiloride half a tab, when she does not take this her bp will increase. Patient states she will start the new dose once she is out of her meds losartan 50,which will be next week.She will then stop the moduretic. Patient is scheduled for lab 05/09/16. Please place lab order. Informed patient we will deny this refill since she will no longer be taking this.

## 2016-04-20 NOTE — Telephone Encounter (Signed)
Per review of cardiology's most recent note she was to discontinue this medication and increase her losartan to 100 mg and take amlodipine 5 mg daily as well. This was discontinued due to her low sodium. Please see if she has been checking her BP and if she has been taking the amlodipine and losartan as advised. We will additionally need to recheck her sodium in the next several weeks. Thanks.

## 2016-04-20 NOTE — Addendum Note (Signed)
Addended by: Birdie SonsSONNENBERG, Harley Mccartney G on: 04/20/2016 12:55 PM   Modules accepted: Orders

## 2016-04-21 ENCOUNTER — Ambulatory Visit
Admission: RE | Admit: 2016-04-21 | Discharge: 2016-04-21 | Disposition: A | Discharge status: Home / Self Care | Payer: PPO | Source: Ambulatory Visit | Attending: General Surgery | Admitting: General Surgery

## 2016-04-21 DIAGNOSIS — Z1231 Encounter for screening mammogram for malignant neoplasm of breast: Secondary | ICD-10-CM | POA: Diagnosis not present

## 2016-04-27 ENCOUNTER — Ambulatory Visit (INDEPENDENT_AMBULATORY_CARE_PROVIDER_SITE_OTHER): Payer: PPO | Admitting: General Surgery

## 2016-04-27 VITALS — BP 132/58 | HR 84 | Resp 142 | Ht 63.5 in | Wt 144.0 lb

## 2016-04-27 DIAGNOSIS — N6019 Diffuse cystic mastopathy of unspecified breast: Secondary | ICD-10-CM | POA: Diagnosis not present

## 2016-04-27 DIAGNOSIS — Z800 Family history of malignant neoplasm of digestive organs: Secondary | ICD-10-CM | POA: Diagnosis not present

## 2016-04-27 NOTE — Patient Instructions (Addendum)
The patient is aware to call back for any questions or concerns. OTC antifungal cream twice a day.  If area does not clear then recommend dermatologist

## 2016-04-27 NOTE — Progress Notes (Signed)
Patient ID: Jessica Wheeler, female   DOB: Oct 27, 1940, 75 y.o.   MRN: [id number]  Chief Complaint  Patient presents with  . Follow-up    mammogram    HPI Jessica BrightlyShirley D Tiggs is a 75 y.o. female.  who presents for a breast evaluation. The most recent mammogram was done on 04/21/2016. She states she has a rash left breast since about July. She did go to a walk in clinic and they told her yeast infection. She does state the area itches.  Patient does perform regular self breast checks and gets regular mammograms done.   I have reviewed the history of present illness with the patient.    HPI  Past Medical History:  Diagnosis Date  . Arthritis   . Bundle branch block, right   . Diabetes mellitus   . Diffuse cystic mastopathy   . Dissection, aorta (HCC)    Dr.Dew follows  . Diverticulosis   . Family history of malignant neoplasm of gastrointestinal tract   . Glaucoma 2005  . History of bronchitis   . History of pancreatitis 2005  . Hypertension 1982  . Personal history of tobacco use, presenting hazards to health   . Ulcer [facility](HCC)     Past Surgical History:  Procedure Laterality Date  . ABDOMINAL HYSTERECTOMY  1991  . APPENDECTOMY    . BACK SURGERY  1982   lower surgery  . BLADDER SURGERY    . BREAST BIOPSY Right 1980's  . BREAST SURGERY     biopsy  . CARDIAC CATHETERIZATION     Dr. Lady GaryFath, few years ago  . CATARACT EXTRACTION W/PHACO Left 08/31/2015   Procedure: CATARACT EXTRACTION PHACO AND INTRAOCULAR LENS PLACEMENT (IOC);  Surgeon: Sherald HessAnita Prakash Vin-Parikh, MD;  [facility];  Service: Ophthalmology;  Laterality: Left;  DIABETIC - oral meds  . CATARACT EXTRACTION W/PHACO Right 10/12/2015   Procedure: CATARACT EXTRACTION PHACO AND INTRAOCULAR LENS PLACEMENT (IOC) right eye;  Surgeon: Sherald HessAnita Prakash Vin-Parikh, MD;  [facility];  Service: Ophthalmology;  Laterality: Right;  DIABETIC - oral meds  . CHOLECYSTECTOMY  1994  . COLONOSCOPY   2008,02/22/2012   Dr. Kiondra Caicedo-2013    Family History  Problem Relation Age of Onset  . Diabetes Mother   . Hypertension Mother   . Anxiety disorder Mother   . Hypertension Father   . Heart disease Father   . Heart attack Father   . Colon cancer Brother   . Hypertension Maternal Grandmother   . Hypertension Maternal Grandfather   . Breast cancer Maternal Aunt 60  . Breast cancer Cousin 350    Social History Social History  Substance Use Topics  . Smoking status: Former Smoker    Packs/day: 1.00    Years: 15.00    Types: Cigarettes    Quit date: 08/03/2003  . Smokeless tobacco: Never Used  . Alcohol use No    Allergies  Allergen Reactions  . Beta Adrenergic Blockers     Chest pressure/difficulty breathing  . Levaquin [Levofloxacin In D5w]     Low blood sugar  . Lisinopril Cough  . Prevnar [Pneumococcal 13-Val Conj Vacc] Rash    Joint Pain    Current Outpatient Prescriptions  Medication Sig Dispense Refill  . amLODipine (NORVASC) 5 MG tablet TAKE 1 TABLET BY MOUTH EVERY DAY 90 tablet 3  . aspirin 81 MG EC tablet Take 81 mg by mouth daily.      . clobetasol ointment (TEMOVATE) 0.05 % Reported on 10/12/2015    .  diclofenac sodium (VOLTAREN) 1 % GEL Apply 4 g topically 4 (four) times daily. To the left knee. 100 g 2  . Ibuprofen (ADVIL) 200 MG CAPS Take 1 capsule by mouth daily.      Marland Kitchen. latanoprost (XALATAN) 0.005 % ophthalmic solution Reported on 10/12/2015  3  . losartan (COZAAR) 100 MG tablet Take 1 tablet (100 mg total) by mouth daily. 90 tablet 3  . metFORMIN (GLUCOPHAGE) 500 MG tablet TAKE 2 TABLETS BY MOUTH TWICE DAILY 360 tablet 1  . vitamin B-12 (CYANOCOBALAMIN) 1000 MCG tablet Take 1,000 mcg by mouth daily.     No current facility-administered medications for this visit.     Review of Systems Review of Systems  Constitutional: Negative.   Respiratory: Negative.   Cardiovascular: Negative.     Blood pressure (!) 132/58, pulse 84, resp. rate (!) 142, height  5' 3.5" (1.613 m), weight 144 lb (65.3 kg).  Physical Exam Physical Exam  Constitutional: She is oriented to person, place, and time. She appears well-developed and well-nourished.  HENT:  Mouth/Throat: Oropharynx is clear and moist.  Eyes: Conjunctivae are normal. No scleral icterus.  Neck: Neck supple.  Cardiovascular: Normal rate, regular rhythm and normal heart sounds.   Pulmonary/Chest: Effort normal and breath sounds normal. Right breast exhibits skin change. Right breast exhibits no inverted nipple, no mass, no nipple discharge and no tenderness. Left breast exhibits skin change. Left breast exhibits no inverted nipple, no mass, no nipple discharge and no tenderness.    Lymphadenopathy:    She has no cervical adenopathy.  Neurological: She is alert and oriented to person, place, and time.  Skin: Skin is warm and dry.  Psychiatric: Her behavior is normal.    Data Reviewed  Mammogram reviewed  Assessment    Topical fungal infection. History of fibrocystic disease. Family history of colon cancer    Plan    OTC antifungal cream twice a day. If area does not clear then recommend dermatologist to evaluate  The patient has been asked to return to the office in one year with a bilateral screening mammogram. Last colonoscopy 2013-due for surveillance next yr.     This information has been scribed by Dorathy DaftMarsha Hatch RN, BSN,BC.   Kimla Furth G 04/27/2016, 2:25 PM

## 2016-04-28 DIAGNOSIS — H60393 Other infective otitis externa, bilateral: Secondary | ICD-10-CM | POA: Diagnosis not present

## 2016-04-28 DIAGNOSIS — Z7689 Persons encountering health services in other specified circumstances: Secondary | ICD-10-CM | POA: Diagnosis not present

## 2016-05-06 ENCOUNTER — Other Ambulatory Visit: Payer: Self-pay | Admitting: Family Medicine

## 2016-05-06 DIAGNOSIS — E1169 Type 2 diabetes mellitus with other specified complication: Secondary | ICD-10-CM

## 2016-05-08 ENCOUNTER — Other Ambulatory Visit: Payer: Self-pay | Admitting: Family Medicine

## 2016-05-08 DIAGNOSIS — E1169 Type 2 diabetes mellitus with other specified complication: Secondary | ICD-10-CM

## 2016-05-09 ENCOUNTER — Other Ambulatory Visit: Payer: Self-pay | Admitting: Family Medicine

## 2016-05-09 ENCOUNTER — Other Ambulatory Visit (INDEPENDENT_AMBULATORY_CARE_PROVIDER_SITE_OTHER): Payer: PPO

## 2016-05-09 DIAGNOSIS — E1169 Type 2 diabetes mellitus with other specified complication: Secondary | ICD-10-CM

## 2016-05-09 DIAGNOSIS — I10 Essential (primary) hypertension: Secondary | ICD-10-CM | POA: Diagnosis not present

## 2016-05-09 DIAGNOSIS — E871 Hypo-osmolality and hyponatremia: Secondary | ICD-10-CM

## 2016-05-09 LAB — BASIC METABOLIC PANEL
BUN: 18 mg/dL (ref 6–23)
CO2: 27 mEq/L (ref 19–32)
Calcium: 9.6 mg/dL (ref 8.4–10.5)
Chloride: 94 mEq/L — ABNORMAL LOW (ref 96–112)
Creatinine, Ser: 0.71 mg/dL (ref 0.40–1.20)
GFR: 84.79 mL/min (ref >60.00)
Glucose, Bld: 161 mg/dL — ABNORMAL HIGH (ref 70–99)
Potassium: 4.1 mEq/L (ref 3.5–5.1)
Sodium: 130 mEq/L — ABNORMAL LOW (ref 135–145)

## 2016-05-09 MED ORDER — METFORMIN HCL 500 MG PO TABS: 1000.0000 mg | ORAL_TABLET | Freq: Two times a day (BID) | ORAL | 1 refills | Status: DC

## 2016-05-13 DIAGNOSIS — E119 Type 2 diabetes mellitus without complications: Secondary | ICD-10-CM | POA: Diagnosis not present

## 2016-05-25 DIAGNOSIS — H6063 Unspecified chronic otitis externa, bilateral: Secondary | ICD-10-CM | POA: Diagnosis not present

## 2016-06-01 DIAGNOSIS — Z79899 Other long term (current) drug therapy: Secondary | ICD-10-CM | POA: Diagnosis not present

## 2016-06-01 DIAGNOSIS — I10 Essential (primary) hypertension: Secondary | ICD-10-CM | POA: Diagnosis not present

## 2016-06-01 DIAGNOSIS — E7800 Pure hypercholesterolemia, unspecified: Secondary | ICD-10-CM | POA: Insufficient documentation

## 2016-06-01 DIAGNOSIS — Z13820 Encounter for screening for osteoporosis: Secondary | ICD-10-CM | POA: Diagnosis not present

## 2016-06-01 DIAGNOSIS — R011 Cardiac murmur, unspecified: Secondary | ICD-10-CM | POA: Diagnosis not present

## 2016-06-01 DIAGNOSIS — Z0000 Encounter for general adult medical examination without abnormal findings: Secondary | ICD-10-CM | POA: Diagnosis not present

## 2016-06-01 DIAGNOSIS — M25562 Pain in left knee: Secondary | ICD-10-CM | POA: Diagnosis not present

## 2016-06-01 DIAGNOSIS — E871 Hypo-osmolality and hyponatremia: Secondary | ICD-10-CM | POA: Diagnosis not present

## 2016-06-01 DIAGNOSIS — E119 Type 2 diabetes mellitus without complications: Secondary | ICD-10-CM | POA: Diagnosis not present

## 2016-06-06 DIAGNOSIS — Z13820 Encounter for screening for osteoporosis: Secondary | ICD-10-CM | POA: Diagnosis not present

## 2016-06-08 DIAGNOSIS — I10 Essential (primary) hypertension: Secondary | ICD-10-CM | POA: Diagnosis not present

## 2016-06-08 DIAGNOSIS — E7800 Pure hypercholesterolemia, unspecified: Secondary | ICD-10-CM | POA: Diagnosis not present

## 2016-06-08 DIAGNOSIS — Z79899 Other long term (current) drug therapy: Secondary | ICD-10-CM | POA: Diagnosis not present

## 2016-06-08 DIAGNOSIS — E119 Type 2 diabetes mellitus without complications: Secondary | ICD-10-CM | POA: Diagnosis not present

## 2016-06-10 DIAGNOSIS — R011 Cardiac murmur, unspecified: Secondary | ICD-10-CM | POA: Diagnosis not present

## 2016-06-15 DIAGNOSIS — R0602 Shortness of breath: Secondary | ICD-10-CM | POA: Diagnosis not present

## 2016-06-15 DIAGNOSIS — Z79899 Other long term (current) drug therapy: Secondary | ICD-10-CM | POA: Diagnosis not present

## 2016-06-15 DIAGNOSIS — R0789 Other chest pain: Secondary | ICD-10-CM | POA: Diagnosis not present

## 2016-07-08 DIAGNOSIS — L400 Psoriasis vulgaris: Secondary | ICD-10-CM | POA: Diagnosis not present

## 2016-07-08 DIAGNOSIS — L300 Nummular dermatitis: Secondary | ICD-10-CM | POA: Diagnosis not present

## 2016-07-14 DIAGNOSIS — E119 Type 2 diabetes mellitus without complications: Secondary | ICD-10-CM | POA: Diagnosis not present

## 2016-07-14 DIAGNOSIS — I10 Essential (primary) hypertension: Secondary | ICD-10-CM | POA: Diagnosis not present

## 2016-07-18 DIAGNOSIS — M5431 Sciatica, right side: Secondary | ICD-10-CM | POA: Diagnosis not present

## 2016-07-18 DIAGNOSIS — M9903 Segmental and somatic dysfunction of lumbar region: Secondary | ICD-10-CM | POA: Diagnosis not present

## 2016-07-18 DIAGNOSIS — M9902 Segmental and somatic dysfunction of thoracic region: Secondary | ICD-10-CM | POA: Diagnosis not present

## 2016-07-18 DIAGNOSIS — M5136 Other intervertebral disc degeneration, lumbar region: Secondary | ICD-10-CM | POA: Diagnosis not present

## 2016-07-19 DIAGNOSIS — M5431 Sciatica, right side: Secondary | ICD-10-CM | POA: Diagnosis not present

## 2016-07-19 DIAGNOSIS — M9903 Segmental and somatic dysfunction of lumbar region: Secondary | ICD-10-CM | POA: Diagnosis not present

## 2016-07-19 DIAGNOSIS — M5136 Other intervertebral disc degeneration, lumbar region: Secondary | ICD-10-CM | POA: Diagnosis not present

## 2016-07-19 DIAGNOSIS — M9902 Segmental and somatic dysfunction of thoracic region: Secondary | ICD-10-CM | POA: Diagnosis not present

## 2016-07-21 DIAGNOSIS — L400 Psoriasis vulgaris: Secondary | ICD-10-CM | POA: Diagnosis not present

## 2016-07-21 DIAGNOSIS — L300 Nummular dermatitis: Secondary | ICD-10-CM | POA: Diagnosis not present

## 2016-07-22 DIAGNOSIS — M9902 Segmental and somatic dysfunction of thoracic region: Secondary | ICD-10-CM | POA: Diagnosis not present

## 2016-07-22 DIAGNOSIS — M9903 Segmental and somatic dysfunction of lumbar region: Secondary | ICD-10-CM | POA: Diagnosis not present

## 2016-07-22 DIAGNOSIS — M5136 Other intervertebral disc degeneration, lumbar region: Secondary | ICD-10-CM | POA: Diagnosis not present

## 2016-07-22 DIAGNOSIS — M5431 Sciatica, right side: Secondary | ICD-10-CM | POA: Diagnosis not present

## 2016-07-26 DIAGNOSIS — M9903 Segmental and somatic dysfunction of lumbar region: Secondary | ICD-10-CM | POA: Diagnosis not present

## 2016-07-26 DIAGNOSIS — M5431 Sciatica, right side: Secondary | ICD-10-CM | POA: Diagnosis not present

## 2016-07-26 DIAGNOSIS — M5136 Other intervertebral disc degeneration, lumbar region: Secondary | ICD-10-CM | POA: Diagnosis not present

## 2016-07-26 DIAGNOSIS — M9902 Segmental and somatic dysfunction of thoracic region: Secondary | ICD-10-CM | POA: Diagnosis not present

## 2016-07-28 DIAGNOSIS — M5136 Other intervertebral disc degeneration, lumbar region: Secondary | ICD-10-CM | POA: Diagnosis not present

## 2016-07-28 DIAGNOSIS — M9903 Segmental and somatic dysfunction of lumbar region: Secondary | ICD-10-CM | POA: Diagnosis not present

## 2016-07-28 DIAGNOSIS — M9902 Segmental and somatic dysfunction of thoracic region: Secondary | ICD-10-CM | POA: Diagnosis not present

## 2016-07-28 DIAGNOSIS — M5431 Sciatica, right side: Secondary | ICD-10-CM | POA: Diagnosis not present

## 2016-08-02 DIAGNOSIS — M9903 Segmental and somatic dysfunction of lumbar region: Secondary | ICD-10-CM | POA: Diagnosis not present

## 2016-08-02 DIAGNOSIS — M5431 Sciatica, right side: Secondary | ICD-10-CM | POA: Diagnosis not present

## 2016-08-02 DIAGNOSIS — M9902 Segmental and somatic dysfunction of thoracic region: Secondary | ICD-10-CM | POA: Diagnosis not present

## 2016-08-02 DIAGNOSIS — M5136 Other intervertebral disc degeneration, lumbar region: Secondary | ICD-10-CM | POA: Diagnosis not present

## 2016-08-04 DIAGNOSIS — M5431 Sciatica, right side: Secondary | ICD-10-CM | POA: Diagnosis not present

## 2016-08-04 DIAGNOSIS — M5136 Other intervertebral disc degeneration, lumbar region: Secondary | ICD-10-CM | POA: Diagnosis not present

## 2016-08-04 DIAGNOSIS — M9902 Segmental and somatic dysfunction of thoracic region: Secondary | ICD-10-CM | POA: Diagnosis not present

## 2016-08-04 DIAGNOSIS — M9903 Segmental and somatic dysfunction of lumbar region: Secondary | ICD-10-CM | POA: Diagnosis not present

## 2016-08-09 DIAGNOSIS — M5136 Other intervertebral disc degeneration, lumbar region: Secondary | ICD-10-CM | POA: Diagnosis not present

## 2016-08-09 DIAGNOSIS — M9902 Segmental and somatic dysfunction of thoracic region: Secondary | ICD-10-CM | POA: Diagnosis not present

## 2016-08-09 DIAGNOSIS — M9903 Segmental and somatic dysfunction of lumbar region: Secondary | ICD-10-CM | POA: Diagnosis not present

## 2016-08-09 DIAGNOSIS — M5431 Sciatica, right side: Secondary | ICD-10-CM | POA: Diagnosis not present

## 2016-08-12 DIAGNOSIS — M9903 Segmental and somatic dysfunction of lumbar region: Secondary | ICD-10-CM | POA: Diagnosis not present

## 2016-08-12 DIAGNOSIS — M9902 Segmental and somatic dysfunction of thoracic region: Secondary | ICD-10-CM | POA: Diagnosis not present

## 2016-08-12 DIAGNOSIS — M5431 Sciatica, right side: Secondary | ICD-10-CM | POA: Diagnosis not present

## 2016-08-12 DIAGNOSIS — M5136 Other intervertebral disc degeneration, lumbar region: Secondary | ICD-10-CM | POA: Diagnosis not present

## 2016-08-15 DIAGNOSIS — M5431 Sciatica, right side: Secondary | ICD-10-CM | POA: Diagnosis not present

## 2016-08-15 DIAGNOSIS — M9903 Segmental and somatic dysfunction of lumbar region: Secondary | ICD-10-CM | POA: Diagnosis not present

## 2016-08-15 DIAGNOSIS — M9902 Segmental and somatic dysfunction of thoracic region: Secondary | ICD-10-CM | POA: Diagnosis not present

## 2016-08-15 DIAGNOSIS — M5136 Other intervertebral disc degeneration, lumbar region: Secondary | ICD-10-CM | POA: Diagnosis not present

## 2016-08-19 DIAGNOSIS — M5136 Other intervertebral disc degeneration, lumbar region: Secondary | ICD-10-CM | POA: Diagnosis not present

## 2016-08-19 DIAGNOSIS — M9903 Segmental and somatic dysfunction of lumbar region: Secondary | ICD-10-CM | POA: Diagnosis not present

## 2016-08-19 DIAGNOSIS — M9902 Segmental and somatic dysfunction of thoracic region: Secondary | ICD-10-CM | POA: Diagnosis not present

## 2016-08-19 DIAGNOSIS — M5431 Sciatica, right side: Secondary | ICD-10-CM | POA: Diagnosis not present

## 2016-08-25 DIAGNOSIS — I10 Essential (primary) hypertension: Secondary | ICD-10-CM | POA: Diagnosis not present

## 2016-08-25 DIAGNOSIS — E119 Type 2 diabetes mellitus without complications: Secondary | ICD-10-CM | POA: Diagnosis not present

## 2016-08-25 DIAGNOSIS — Z79899 Other long term (current) drug therapy: Secondary | ICD-10-CM | POA: Diagnosis not present

## 2016-08-25 DIAGNOSIS — E7800 Pure hypercholesterolemia, unspecified: Secondary | ICD-10-CM | POA: Diagnosis not present

## 2016-08-25 DIAGNOSIS — R21 Rash and other nonspecific skin eruption: Secondary | ICD-10-CM | POA: Diagnosis not present

## 2016-09-03 NOTE — Progress Notes (Signed)
Cardiology Office Note  Date:  09/06/2016   ID:  Kieth BrightlyShirley D Riggins, DOB 12/23/1940, MRN [id number]  PCP:  Glori LuisSonnenberg, Eric G, MD   Chief Complaint  Patient presents with  . other    6 month follow up. Meds reviewed by the pt. verbally. "doing well."     HPI:  [name] is a very pleasant 76 year old woman with a history of  diabetes,  hypertension,  smoking,  mild coronary artery disease by cardiac catheterization in 2007  aortic dissection following catheterization,   renal artery stenosis followed by Dr. Wyn Quakerew. Chronically low sodium.  Cardiac catheterization in 2007  showed 20% proximal RCA disease and 20% LAD disease.She has a short region of dissection in the distal aorta, seen on CT scan in 2010 On U/S in 2013: no dissection who presents for routine followup of her coronary artery disease.   In follow-up today she is very frustrated with her medications and blood pressure Reports that she has a rash which she attributes to new medication changes, possibly the HCTZ alone  On her last clinic visit I stopped amiloride HCTZ secondary to low sodium and potassium She had been on this for many years and wants to retry the medication as blood pressure was good on the combination  Intolerance to imdur, ziac She reports that she feels terrible on beta blockers  She has a chronic Bladder problem When she has to urinate she has epigastric discomfort when she knows she has to empty her bladder  Chronic left knee pain, chronic issue Takes meloxicam  Xray of her knee, osteoarthritis  Denies any chest pain concerning for angina, no significant shortness of breath   hemoglobin A1c in the 6 range Prior sodium 132,   EKG personally reviewed by myself on todays visit Shows normal sinus rhythm with rate 77 bpm right bundle branch block no significant ST or T-wave changes  Other past medical history reviewed severe reaction to pneumonia shot last year in 2015, still with hand weakness,  joint problems. She had a rash initially that has resolved  Prior history of shingles  Blood pressure has been up and down but in general within a reasonable range, sugars also climbing slightly. Regular exercise program every day.  Previous  Echocardiogram was performed that was normal  Stress test/Myoview was also normal showing no ischemia .   Previous  Lab work shows normal TSH, sodium 132, hemoglobin A1c 6.2  PMH:   has a past medical history of Arthritis; Bundle branch block, right; Diabetes mellitus; Diffuse cystic mastopathy; Dissection, aorta (HCC); Diverticulosis; Family history of malignant neoplasm of gastrointestinal tract; Glaucoma (2005); History of bronchitis; History of pancreatitis (2005); Hypertension (1982); Personal history of tobacco use, presenting hazards to health; and Ulcer.  PSH:    Past Surgical History:  Procedure Laterality Date  . ABDOMINAL HYSTERECTOMY  1991  . APPENDECTOMY    . BACK SURGERY  1982   lower surgery  . BLADDER SURGERY    . BREAST BIOPSY Right 1980's  . BREAST SURGERY     biopsy  . CARDIAC CATHETERIZATION     Dr. Lady GaryFath, few years ago  . CATARACT EXTRACTION W/PHACO Left 08/31/2015   Procedure: CATARACT EXTRACTION PHACO AND INTRAOCULAR LENS PLACEMENT (IOC);  Surgeon: Sherald HessAnita Prakash Vin-Parikh, MD;  [facility];  Service: Ophthalmology;  Laterality: Left;  DIABETIC - oral meds  . CATARACT EXTRACTION W/PHACO Right 10/12/2015   Procedure: CATARACT EXTRACTION PHACO AND INTRAOCULAR LENS PLACEMENT (IOC) right eye;  Surgeon:  Sherald Hess, MD;  Location: [facility];  Service: Ophthalmology;  Laterality: Right;  DIABETIC - oral meds  . CHOLECYSTECTOMY  1994  . COLONOSCOPY  2008,02/22/2012   Dr. Sankar-2013    Current Outpatient Prescriptions  Medication Sig Dispense Refill  . amLODipine (NORVASC) 5 MG tablet TAKE 1 TABLET BY MOUTH EVERY DAY 90 tablet 3  . aspirin 81 MG EC tablet Take 81 mg by mouth daily.       . clobetasol ointment (TEMOVATE) 0.05 % Reported on 10/12/2015    . hydrochlorothiazide (HYDRODIURIL) 12.5 MG tablet Take 12.5 mg by mouth daily.    . Ibuprofen (ADVIL) 200 MG CAPS Take 1 capsule by mouth daily.      Marland Kitchen latanoprost (XALATAN) 0.005 % ophthalmic solution Reported on 10/12/2015  3  . losartan (COZAAR) 100 MG tablet Take 1 tablet (100 mg total) by mouth daily. 90 tablet 3  . metFORMIN (GLUCOPHAGE) 500 MG tablet Take 2 tablets (1,000 mg total) by mouth 2 (two) times daily. 360 tablet 1  . vitamin B-12 (CYANOCOBALAMIN) 1000 MCG tablet Take 1,000 mcg by mouth daily.     No current facility-administered medications for this visit.      Allergies:   Beta adrenergic blockers; Levaquin [levofloxacin in d5w]; Lisinopril; and Prevnar [pneumococcal 13-val conj vacc]   Social History:  The patient  reports that she quit smoking about 13 years ago. Her smoking use included Cigarettes. She has a 15.00 pack-year smoking history. She has never used smokeless tobacco. She reports that she does not drink alcohol or use drugs.   Family History:   family history includes Anxiety disorder in her mother; Breast cancer (age of onset: 48) in her cousin; Breast cancer (age of onset: 58) in her maternal aunt; Colon cancer in her brother; Diabetes in her mother; Heart attack in her father; Heart disease in her father; Hypertension in her father, maternal grandfather, maternal grandmother, and mother.    Review of Systems: Review of Systems  Constitutional: Negative.   Respiratory: Negative.   Cardiovascular: Negative.   Gastrointestinal: Negative.   Musculoskeletal: Positive for joint pain.  Neurological: Negative.   Psychiatric/Behavioral: Negative.   All other systems reviewed and are negative.    PHYSICAL EXAM: VS:  BP (!) 150/78 (BP Location: Left Arm, Patient Position: Sitting, Cuff Size: Normal)   Pulse 77   Ht 5\' 2"  (1.575 m)   Wt 147 lb 12 oz (67 kg)   BMI 27.02 kg/m  , BMI Body  mass index is 27.02 kg/m. GEN: Well nourished, well developed, in no acute distress  HEENT: normal  Neck: no JVD, carotid bruits, or masses Cardiac: RRR; no murmurs, rubs, or gallops,no edema  Respiratory:  clear to auscultation bilaterally, normal work of breathing GI: soft, nontender, nondistended, + BS MS: no deformity or atrophy  Skin: warm and dry, no rash Neuro:  Strength and sensation are intact Psych: euthymic mood, full affect    Recent Labs: 05/09/2016: BUN 18; Creatinine, Ser 0.71; Potassium 4.1; Sodium 130    Lipid Panel No results found for: CHOL, HDL, LDLCALC, TRIG    Wt Readings from Last 3 Encounters:  09/06/16 147 lb 12 oz (67 kg)  04/27/16 144 lb (65.3 kg)  03/29/16 145 lb (65.8 kg)       ASSESSMENT AND PLAN:  Coronary artery disease involving native coronary artery of native heart without angina pectoris - Plan: CANCELED: EKG 12-Lead Currently with no symptoms of angina. No further workup at this  time. Continue current medication regimen.  Essential hypertension - Plan: losartan (COZAAR) 100 MG tablet, CANCELED: EKG 12-Lead Long discussion concerning her medications She seemed to be doing better on the amiloride HCTZ We will restart this one half pill every other day Suggested she liberalize her sodium and take potassium with the pill We will stop the HCTZ as she attributes this to her rash Stay on losartan 100 mg daily Stay on amlodipine 5 mg daily No significant leg swelling. If blood pressure continues to run high we'll increase amlodipine up to 10 mg daily or consider increasing amiloride HCTZ up to one half pill daily  Pure hypercholesterolemia Currently not on a statin  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HCC) Not seen on ultrasound 2013, no further workup needed  Type 2 diabetes mellitus with other specified complication, without long-term current use of insulin (HCC) We have encouraged continued exercise, careful diet management in an effort to  lose weight. Managed by primary care  Primary osteoarthritis of left knee Osteoarthritis, on meloxicam  Disposition:   F/U  6 months   Total encounter time more than 25 minutes  Greater than 50% was spent in counseling and coordination of care with the patient    Orders Placed This Encounter  Procedures  . EKG 12-Lead     Signed, Dossie Arbour, M.D., Ph.D. 09/06/2016  [facility] [place], [place] [phone]

## 2016-09-06 ENCOUNTER — Encounter: Payer: Self-pay | Admitting: Cardiovascular Disease

## 2016-09-06 ENCOUNTER — Ambulatory Visit (INDEPENDENT_AMBULATORY_CARE_PROVIDER_SITE_OTHER): Payer: PPO | Admitting: Cardiovascular Disease

## 2016-09-06 VITALS — BP 150/78 | HR 77 | Ht 62.0 in | Wt 147.8 lb

## 2016-09-06 DIAGNOSIS — E1159 Type 2 diabetes mellitus with other circulatory complications: Secondary | ICD-10-CM

## 2016-09-06 DIAGNOSIS — I25118 Atherosclerotic heart disease of native coronary artery with other forms of angina pectoris: Secondary | ICD-10-CM | POA: Diagnosis not present

## 2016-09-06 DIAGNOSIS — I10 Essential (primary) hypertension: Secondary | ICD-10-CM

## 2016-09-06 DIAGNOSIS — E782 Mixed hyperlipidemia: Secondary | ICD-10-CM

## 2016-09-06 DIAGNOSIS — I701 Atherosclerosis of renal artery: Secondary | ICD-10-CM

## 2016-09-06 MED ORDER — AMILORIDE-HYDROCHLOROTHIAZIDE 5-50 MG PO TABS: 1.0000 | ORAL_TABLET | Freq: Every day | ORAL | 11 refills | Status: DC

## 2016-09-06 NOTE — Patient Instructions (Addendum)
Medication Instructions:   Stop the HCTZ  Start 1/2 amiloride/HCTZ every other day Take with potassium  Take extra amlodipine 5 mg as needed for high blood pressure  Labwork:  No new labs needed  Testing/Procedures:  No further testing at this time   I recommend watching educational videos on topics of interest to you at:       www.goemmi.com  Enter code: HEARTCARE    Follow-Up: It was a pleasure seeing you in the office today. Please call us if you have new issues that need to be addressed before your next appt.  [phone]  Your physician wants you to follow-up in: 1 month.    If you need a refill on your cardiac medications before your next appointment, please call your pharmacy.

## 2016-09-08 DIAGNOSIS — E119 Type 2 diabetes mellitus without complications: Secondary | ICD-10-CM | POA: Diagnosis not present

## 2016-09-08 DIAGNOSIS — Z79899 Other long term (current) drug therapy: Secondary | ICD-10-CM | POA: Diagnosis not present

## 2016-09-09 DIAGNOSIS — L309 Dermatitis, unspecified: Secondary | ICD-10-CM | POA: Diagnosis not present

## 2016-10-25 DIAGNOSIS — J40 Bronchitis, not specified as acute or chronic: Secondary | ICD-10-CM | POA: Diagnosis not present

## 2016-10-25 DIAGNOSIS — I10 Essential (primary) hypertension: Secondary | ICD-10-CM | POA: Diagnosis not present

## 2016-10-25 DIAGNOSIS — E119 Type 2 diabetes mellitus without complications: Secondary | ICD-10-CM | POA: Diagnosis not present

## 2016-11-01 ENCOUNTER — Other Ambulatory Visit: Payer: Self-pay | Admitting: Family Medicine

## 2016-11-01 DIAGNOSIS — I10 Essential (primary) hypertension: Secondary | ICD-10-CM

## 2016-11-01 NOTE — Telephone Encounter (Signed)
Sent to pharmacy. Needs follow-up scheduled. Thanks. 

## 2016-11-01 NOTE — Telephone Encounter (Signed)
Last OV 03/29/16 last filled by Dr.Gollan 03/10/16 90 [id number]

## 2016-11-03 NOTE — Telephone Encounter (Signed)
Patient states Dr.Gollan changed this and he is managing this. She states she will call and cancel this rx

## 2016-11-06 ENCOUNTER — Other Ambulatory Visit: Payer: Self-pay | Admitting: Family Medicine

## 2016-11-06 DIAGNOSIS — E1169 Type 2 diabetes mellitus with other specified complication: Secondary | ICD-10-CM

## 2016-11-15 DIAGNOSIS — J309 Allergic rhinitis, unspecified: Secondary | ICD-10-CM | POA: Diagnosis not present

## 2016-12-01 DIAGNOSIS — I10 Essential (primary) hypertension: Secondary | ICD-10-CM | POA: Diagnosis not present

## 2016-12-01 DIAGNOSIS — R509 Fever, unspecified: Secondary | ICD-10-CM | POA: Diagnosis not present

## 2016-12-01 DIAGNOSIS — R0600 Dyspnea, unspecified: Secondary | ICD-10-CM | POA: Diagnosis not present

## 2016-12-01 DIAGNOSIS — E7800 Pure hypercholesterolemia, unspecified: Secondary | ICD-10-CM | POA: Diagnosis not present

## 2016-12-01 DIAGNOSIS — Z79899 Other long term (current) drug therapy: Secondary | ICD-10-CM | POA: Diagnosis not present

## 2016-12-01 DIAGNOSIS — E119 Type 2 diabetes mellitus without complications: Secondary | ICD-10-CM | POA: Diagnosis not present

## 2016-12-01 DIAGNOSIS — R0609 Other forms of dyspnea: Secondary | ICD-10-CM | POA: Diagnosis not present

## 2017-02-01 ENCOUNTER — Other Ambulatory Visit: Payer: Self-pay | Admitting: Family Medicine

## 2017-02-01 DIAGNOSIS — I10 Essential (primary) hypertension: Secondary | ICD-10-CM

## 2017-02-02 ENCOUNTER — Other Ambulatory Visit: Payer: Self-pay | Admitting: Family Medicine

## 2017-02-02 DIAGNOSIS — E1169 Type 2 diabetes mellitus with other specified complication: Secondary | ICD-10-CM

## 2017-02-02 NOTE — Telephone Encounter (Signed)
NO labs since 8/17 and no appointment with PCP since 11/17 ok to fill?

## 2017-02-02 NOTE — Telephone Encounter (Signed)
It appears the patient has transferred care to [facility]. I'm going to deny this. She needs to request it from them.

## 2017-02-02 NOTE — Telephone Encounter (Signed)
Patient appears to have transferred care to [facility]. She should request this from them.

## 2017-02-02 NOTE — Telephone Encounter (Signed)
NO labs since 8/17 and no appointment with PCP since 11/17 ok to fill? 

## 2017-02-10 ENCOUNTER — Other Ambulatory Visit: Payer: Self-pay | Admitting: Pharmacist

## 2017-02-10 NOTE — Patient Outreach (Signed)
Call to follow up directly with her PCP's office for medication reconcilliation regarding her glimepiride. Leave a message letting provider know that patient reports that she had multiple episodes of hypoglycemia on this medication. Reports that she stopped taking the medication and informed her PCP of these adverse events and that she had stopped the medication at her visit in April of this year. Note that per [facility] Care Everywhere, glimepiride is still on the patient's medication list with her PCP's office.  Duanne Moron, PharmD, [facility] Clinical Pharmacist [facility] Care Management [phone]

## 2017-02-10 NOTE — Patient Outreach (Signed)
Incoming call from [name] in response to the [facility] Medication Adherence Campaign. Speak with patient. HIPAA identifiers verified and verbal consent received.  Ms. Sanjuan reports that she takes her metformin twice daily as directed. Denies any missed doses or barriers to taking her medication. Reports that she was prescribed glimepiride early in the year. However, reports that she had multiple episodes of hypoglycemia on this medication. Reports that she stopped taking the medication and informed her PCP of these adverse events and that she had stopped the medication at her visit in April of this year. Note that per [facility] Care Everywhere, glimepiride is still on the patient's medication list with her PCP's office. Let patient know that I will follow up directly with her PCP's office for medication reconciliation.  Patient denies any further medication questions or concerns at this time.   PLAN:  Will call to follow up directly with her PCP's office for medication reconcilliation regarding her glimepiride.  Duanne Moron, PharmD, [facility] Clinical Pharmacist [facility] Care Management [phone]

## 2017-02-22 ENCOUNTER — Other Ambulatory Visit: Payer: Self-pay

## 2017-02-22 DIAGNOSIS — Z1231 Encounter for screening mammogram for malignant neoplasm of breast: Secondary | ICD-10-CM

## 2017-03-08 ENCOUNTER — Encounter: Payer: Self-pay | Admitting: General Surgery

## 2017-03-08 ENCOUNTER — Ambulatory Visit: Payer: PPO | Admitting: General Surgery

## 2017-03-08 VITALS — BP 144/62 | HR 86 | Resp 12 | Ht 63.5 in | Wt 143.0 lb

## 2017-03-08 DIAGNOSIS — I10 Essential (primary) hypertension: Secondary | ICD-10-CM | POA: Diagnosis not present

## 2017-03-08 DIAGNOSIS — Z79899 Other long term (current) drug therapy: Secondary | ICD-10-CM | POA: Diagnosis not present

## 2017-03-08 DIAGNOSIS — E119 Type 2 diabetes mellitus without complications: Secondary | ICD-10-CM | POA: Diagnosis not present

## 2017-03-08 DIAGNOSIS — Z800 Family history of malignant neoplasm of digestive organs: Secondary | ICD-10-CM | POA: Diagnosis not present

## 2017-03-08 DIAGNOSIS — N6019 Diffuse cystic mastopathy of unspecified breast: Secondary | ICD-10-CM | POA: Diagnosis not present

## 2017-03-08 DIAGNOSIS — E7800 Pure hypercholesterolemia, unspecified: Secondary | ICD-10-CM | POA: Diagnosis not present

## 2017-03-08 LAB — LIPID PANEL
Cholesterol: 181 (ref 0–200)
HDL: 77 — AB (ref 35–70)
LDL Cholesterol: 92
Triglycerides: 62 (ref 40–160)

## 2017-03-08 LAB — BASIC METABOLIC PANEL
BUN: 13 (ref 4–21)
Creatinine: 0.6 (ref 0.5–1.1)
Glucose: 147
Potassium: 3.9 (ref 3.4–5.3)

## 2017-03-08 LAB — HEPATIC FUNCTION PANEL
ALT: 27 (ref 7–35)
AST: 23 (ref 13–35)
Alkaline Phosphatase: 40 (ref 25–125)
Bilirubin, Total: 0.5

## 2017-03-08 LAB — CBC AND DIFFERENTIAL
HCT: 38 (ref 36–46)
Hemoglobin: 13.2 (ref 12.0–16.0)
Platelets: 262 (ref 150–399)
WBC: 6.3

## 2017-03-08 LAB — TSH: TSH: 3 (ref 0.41–5.90)

## 2017-03-08 LAB — HEMOGLOBIN A1C: Hemoglobin A1C: 6.6

## 2017-03-08 MED ORDER — POLYETHYLENE GLYCOL 3350 17 GM/SCOOP PO POWD: ORAL | 0 refills | Status: DC

## 2017-03-08 NOTE — Progress Notes (Signed)
Patient ID: Jessica Wheeler, female   DOB: 01-Jan-1941, 76 y.o.   MRN: [id number]  Chief Complaint  Patient presents with  . Colonoscopy  . Follow-up    HPI Jessica BrightlyShirley D Yeakle is a 76 y.o. female.  who presents for a breast evaluation and to colonoscopy screening. The most recent mammogram was done on 04-21-16, next mammogram for 04-26-17. No new breast issues. Admits fatigue, denies weight loss.  Patient does perform regular self breast checks and gets regular mammograms done.   Positive family history of colon cancer, brother dx in his 6570's.     HPI  Past Medical History:  Diagnosis Date  . Arthritis   . Bundle branch block, right   . Diabetes mellitus   . Diffuse cystic mastopathy   . Dissection, aorta (HCC)    Dr.Dew follows  . Diverticulosis   . Family history of malignant neoplasm of gastrointestinal tract   . Glaucoma 2005  . History of bronchitis   . History of pancreatitis 2005  . Hypertension 1982  . Personal history of tobacco use, presenting hazards to health   . Ulcer     Past Surgical History:  Procedure Laterality Date  . ABDOMINAL HYSTERECTOMY  1991  . APPENDECTOMY    . BACK SURGERY  1982   lower surgery  . BLADDER SURGERY    . BREAST BIOPSY Right 1980's  . BREAST SURGERY     biopsy  . CARDIAC CATHETERIZATION     Dr. Lady GaryFath, few years ago  . CHOLECYSTECTOMY  1994  . COLONOSCOPY  2008,02/22/2012   Dr. Adlee Paar-2013    Family History  Problem Relation Age of Onset  . Diabetes Mother   . Hypertension Mother   . Anxiety disorder Mother   . Hypertension Father   . Heart disease Father   . Heart attack Father   . Colon cancer Brother   . Hypertension Maternal Grandmother   . Hypertension Maternal Grandfather   . Breast cancer Maternal Aunt 60  . Breast cancer Cousin 1950    Social History Social History   Tobacco Use  . Smoking status: Former Smoker    Packs/day: 1.00    Years: 15.00    Pack years: 15.00    Types: Cigarettes    Last attempt to  quit: 08/03/2003    Years since quitting: 13.6  . Smokeless tobacco: Never Used  Substance Use Topics  . Alcohol use: No  . Drug use: No    Allergies  Allergen Reactions  . Beta Adrenergic Blockers     Chest pressure/difficulty breathing  . Levaquin [Levofloxacin In D5w]     Low blood sugar  . Lisinopril Cough  . Prevnar [Pneumococcal 13-Val Conj Vacc] Rash    Joint Pain    Current Outpatient Medications  Medication Sig Dispense Refill  . amiloride-hydrochlorothiazide (MODURETIC) 5-50 MG tablet Take 1 tablet by mouth daily. 30 tablet 11  . amLODipine (NORVASC) 5 MG tablet TAKE 1 TABLET BY MOUTH EVERY DAY 90 tablet 3  . aspirin 81 MG EC tablet Take 81 mg by mouth daily.      . clobetasol ointment (TEMOVATE) 0.05 % Reported on 10/12/2015    . Ibuprofen (ADVIL) 200 MG CAPS Take 1 capsule by mouth daily.      Marland Kitchen. latanoprost (XALATAN) 0.005 % ophthalmic solution Reported on 10/12/2015  3  . losartan (COZAAR) 100 MG tablet Take 1 tablet (100 mg total) by mouth daily. 90 tablet 0  . metFORMIN (GLUCOPHAGE) 500 MG tablet  TAKE 2 TABLETS(1000 MG) BY MOUTH TWICE DAILY 360 tablet 0  . vitamin B-12 (CYANOCOBALAMIN) 1000 MCG tablet Take 1,000 mcg by mouth daily.     No current facility-administered medications for this visit.     Review of Systems Review of Systems  Constitutional: Negative.   Respiratory: Negative.   Cardiovascular: Negative.     Blood pressure (!) 144/62, pulse 86, resp. rate 12, height 5' 3.5" (1.613 m), weight 143 lb (64.9 kg).  Physical Exam Physical Exam  Constitutional: She is oriented to person, place, and time. She appears well-developed and well-nourished.  HENT:  Mouth/Throat: Oropharynx is clear and moist.  Eyes: Conjunctivae are normal. No scleral icterus.  Neck: Neck supple. No thyromegaly present.  Cardiovascular: Normal rate, regular rhythm and normal heart sounds.  Pulmonary/Chest: Effort normal and breath sounds normal. No respiratory distress. Right  breast exhibits no inverted nipple, no mass, no nipple discharge, no skin change and no tenderness. Left breast exhibits no inverted nipple, no mass, no nipple discharge, no skin change and no tenderness.  Lymphadenopathy:    She has no cervical adenopathy.    She has no axillary adenopathy.  Neurological: She is alert and oriented to person, place, and time.  Skin: Skin is warm and dry.  Psychiatric: Her behavior is normal.    Data Reviewed Previous notes, labs, colonoscopy.  Last colonoscopy 02/22/12 revealed diverticula, no polyps, f/u recommended in 5 years.    Assessment    History of fibrocystic disease. Mammogram scheduled for 04-26-17. Physical exam stable. First degree family history of colon cancer. Due for surveillance colonoscopy.      Plan    Recommend pt follow up with PCP regarding new onset fatigue.   Colonoscopy with possible biopsy/polypectomy prn: Information regarding the procedure, including its potential risks and complications (including but not limited to perforation of the bowel, which may require emergency surgery to repair, and bleeding) was verbally given to the patient. Educational information regarding lower intestinal endoscopy was given to the patient. Written instructions for how to complete the bowel prep using Miralax were provided. The importance of drinking ample fluids to avoid dehydration as a result of the prep emphasized.  Patient will be asked to return to the office in one year with a bilateral screening mammogram with Dr Lemar LivingsByrnett.       HPI, Physical Exam, Assessment and Plan have been scribed under the direction and in the presence of Kathreen CosierS. G. Dierdre Mccalip, MD Dorathy DaftMarsha Hatch, RN  Gerlene BurdockSANKAR,Ashyr Hedgepath G 03/08/2017, 12:58 PM  Patient has been scheduled for a colonoscopy on 04-04-17 at [facility]. Miralax prescription has been sent in to the patient's pharmacy today. Colonoscopy instructions have been reviewed with the patient. This patient is aware to call  the office if they have further questions.   Nicholes MangoMichele J. Bailey, CMA

## 2017-03-08 NOTE — H&P (View-Only) (Signed)
Patient ID: Jessica Wheeler, female   DOB: 06/09/1940, 76 y.o.   MRN: [id number]  Chief Complaint  Patient presents with  . Colonoscopy  . Follow-up    HPI Jessica Wheeler is a 76 y.o. female.  who presents for a breast evaluation and to colonoscopy screening. The most recent mammogram was done on 04-21-16, next mammogram for 04-26-17. No new breast issues. Admits fatigue, denies weight loss.  Patient does perform regular self breast checks and gets regular mammograms done.   Positive family history of colon cancer, brother dx in his 70's.     HPI  Past Medical History:  Diagnosis Date  . Arthritis   . Bundle branch block, right   . Diabetes mellitus   . Diffuse cystic mastopathy   . Dissection, aorta (HCC)    Dr.Dew follows  . Diverticulosis   . Family history of malignant neoplasm of gastrointestinal tract   . Glaucoma 2005  . History of bronchitis   . History of pancreatitis 2005  . Hypertension 1982  . Personal history of tobacco use, presenting hazards to health   . Ulcer     Past Surgical History:  Procedure Laterality Date  . ABDOMINAL HYSTERECTOMY  1991  . APPENDECTOMY    . BACK SURGERY  1982   lower surgery  . BLADDER SURGERY    . BREAST BIOPSY Right 1980's  . BREAST SURGERY     biopsy  . CARDIAC CATHETERIZATION     Dr. Fath, few years ago  . CHOLECYSTECTOMY  1994  . COLONOSCOPY  2008,02/22/2012   Dr. Sankar-2013    Family History  Problem Relation Age of Onset  . Diabetes Mother   . Hypertension Mother   . Anxiety disorder Mother   . Hypertension Father   . Heart disease Father   . Heart attack Father   . Colon cancer Brother   . Hypertension Maternal Grandmother   . Hypertension Maternal Grandfather   . Breast cancer Maternal Aunt 60  . Breast cancer Cousin 50    Social History Social History   Tobacco Use  . Smoking status: Former Smoker    Packs/day: 1.00    Years: 15.00    Pack years: 15.00    Types: Cigarettes    Last attempt to  quit: 08/03/2003    Years since quitting: 13.6  . Smokeless tobacco: Never Used  Substance Use Topics  . Alcohol use: No  . Drug use: No    Allergies  Allergen Reactions  . Beta Adrenergic Blockers     Chest pressure/difficulty breathing  . Levaquin [Levofloxacin In D5w]     Low blood sugar  . Lisinopril Cough  . Prevnar [Pneumococcal 13-Val Conj Vacc] Rash    Joint Pain    Current Outpatient Medications  Medication Sig Dispense Refill  . amiloride-hydrochlorothiazide (MODURETIC) 5-50 MG tablet Take 1 tablet by mouth daily. 30 tablet 11  . amLODipine (NORVASC) 5 MG tablet TAKE 1 TABLET BY MOUTH EVERY DAY 90 tablet 3  . aspirin 81 MG EC tablet Take 81 mg by mouth daily.      . clobetasol ointment (TEMOVATE) 0.05 % Reported on 10/12/2015    . Ibuprofen (ADVIL) 200 MG CAPS Take 1 capsule by mouth daily.      . latanoprost (XALATAN) 0.005 % ophthalmic solution Reported on 10/12/2015  3  . losartan (COZAAR) 100 MG tablet Take 1 tablet (100 mg total) by mouth daily. 90 tablet 0  . metFORMIN (GLUCOPHAGE) 500 MG tablet   TAKE 2 TABLETS(1000 MG) BY MOUTH TWICE DAILY 360 tablet 0  . vitamin B-12 (CYANOCOBALAMIN) 1000 MCG tablet Take 1,000 mcg by mouth daily.     No current facility-administered medications for this visit.     Review of Systems Review of Systems  Constitutional: Negative.   Respiratory: Negative.   Cardiovascular: Negative.     Blood pressure (!) 144/62, pulse 86, resp. rate 12, height 5' 3.5" (1.613 m), weight 143 lb (64.9 kg).  Physical Exam Physical Exam  Constitutional: She is oriented to person, place, and time. She appears well-developed and well-nourished.  HENT:  Mouth/Throat: Oropharynx is clear and moist.  Eyes: Conjunctivae are normal. No scleral icterus.  Neck: Neck supple. No thyromegaly present.  Cardiovascular: Normal rate, regular rhythm and normal heart sounds.  Pulmonary/Chest: Effort normal and breath sounds normal. No respiratory distress. Right  breast exhibits no inverted nipple, no mass, no nipple discharge, no skin change and no tenderness. Left breast exhibits no inverted nipple, no mass, no nipple discharge, no skin change and no tenderness.  Lymphadenopathy:    She has no cervical adenopathy.    She has no axillary adenopathy.  Neurological: She is alert and oriented to person, place, and time.  Skin: Skin is warm and dry.  Psychiatric: Her behavior is normal.    Data Reviewed Previous notes, labs, colonoscopy.  Last colonoscopy 02/22/12 revealed diverticula, no polyps, f/u recommended in 5 years.    Assessment    History of fibrocystic disease. Mammogram scheduled for 04-26-17. Physical exam stable. First degree family history of colon cancer. Due for surveillance colonoscopy.      Plan    Recommend pt follow up with PCP regarding new onset fatigue.   Colonoscopy with possible biopsy/polypectomy prn: Information regarding the procedure, including its potential risks and complications (including but not limited to perforation of the bowel, which may require emergency surgery to repair, and bleeding) was verbally given to the patient. Educational information regarding lower intestinal endoscopy was given to the patient. Written instructions for how to complete the bowel prep using Miralax were provided. The importance of drinking ample fluids to avoid dehydration as a result of the prep emphasized.  Patient will be asked to return to the office in one year with a bilateral screening mammogram with Dr Lemar LivingsByrnett.       HPI, Physical Exam, Assessment and Plan have been scribed under the direction and in the presence of Kathreen CosierS. G. Sankar, MD Dorathy DaftMarsha Hatch, RN  Gerlene BurdockSANKAR,SEEPLAPUTHUR G 03/08/2017, 12:58 PM  Patient has been scheduled for a colonoscopy on 04-04-17 at [facility]. Miralax prescription has been sent in to the patient's pharmacy today. Colonoscopy instructions have been reviewed with the patient. This patient is aware to call  the office if they have further questions.   Nicholes MangoMichele J. Bailey, CMA

## 2017-03-08 NOTE — Patient Instructions (Addendum)
Patient will be asked to return to the office in one year with a bilateral screening mammogram with Dr Lemar LivingsByrnett.  Colonoscopy, Adult A colonoscopy is an exam to look at the entire large intestine. During the exam, a lubricated, bendable tube is inserted into the anus and then passed into the rectum, colon, and other parts of the large intestine. A colonoscopy is often done as a part of normal colorectal screening or in response to certain symptoms, such as anemia, persistent diarrhea, abdominal pain, and blood in the stool. The exam can help screen for and diagnose medical problems, including:  Tumors.  Polyps.  Inflammation.  Areas of bleeding.  Tell a health care provider about:  Any allergies you have.  All medicines you are taking, including vitamins, herbs, eye drops, creams, and over-the-counter medicines.  Any problems you or family members have had with anesthetic medicines.  Any blood disorders you have.  Any surgeries you have had.  Any medical conditions you have.  Any problems you have had passing stool. What are the risks? Generally, this is a safe procedure. However, problems may occur, including:  Bleeding.  A tear in the intestine.  A reaction to medicines given during the exam.  Infection (rare).  What happens before the procedure? Eating and drinking restrictions Follow instructions from your health care provider about eating and drinking, which may include:  A few days before the procedure - follow a low-fiber diet. Avoid nuts, seeds, dried fruit, raw fruits, and vegetables.  1-3 days before the procedure - follow a clear liquid diet. Drink only clear liquids, such as clear broth or bouillon, black coffee or tea, clear juice, clear soft drinks or sports drinks, gelatin dessert, and popsicles. Avoid any liquids that contain red or purple dye.  On the day of the procedure - do not eat or drink anything during the 2 hours before the procedure, or within  the time period that your health care provider recommends.  Bowel prep If you were prescribed an oral bowel prep to clean out your colon:  Take it as told by your health care provider. Starting the day before your procedure, you will need to drink a large amount of medicated liquid. The liquid will cause you to have multiple loose stools until your stool is almost clear or light green.  If your skin or anus gets irritated from diarrhea, you may use these to relieve the irritation: ? Medicated wipes, such as adult wet wipes with aloe and vitamin E. ? A skin soothing-product like petroleum jelly.  If you vomit while drinking the bowel prep, take a break for up to 60 minutes and then begin the bowel prep again. If vomiting continues and you cannot take the bowel prep without vomiting, call your health care provider.  General instructions  Ask your health care provider about changing or stopping your regular medicines. This is especially important if you are taking diabetes medicines or blood thinners.  Plan to have someone take you home from the hospital or clinic. What happens during the procedure?  An IV tube may be inserted into one of your veins.  You will be given medicine to help you relax (sedative).  To reduce your risk of infection: ? Your health care team will wash or sanitize their hands. ? Your anal area will be washed with soap.  You will be asked to lie on your side with your knees bent.  Your health care provider will lubricate a long, thin, flexible  tube. The tube will have a camera and a light on the end.  The tube will be inserted into your anus.  The tube will be gently eased through your rectum and colon.  Air will be delivered into your colon to keep it open. You may feel some pressure or cramping.  The camera will be used to take images during the procedure.  A small tissue sample may be removed from your body to be examined under a microscope (biopsy). If  any potential problems are found, the tissue will be sent to a lab for testing.  If small polyps are found, your health care provider may remove them and have them checked for cancer cells.  The tube that was inserted into your anus will be slowly removed. The procedure may vary among health care providers and hospitals. What happens after the procedure?  Your blood pressure, heart rate, breathing rate, and blood oxygen level will be monitored until the medicines you were given have worn off.  Do not drive for 24 hours after the exam.  You may have a small amount of blood in your stool.  You may pass gas and have mild abdominal cramping or bloating due to the air that was used to inflate your colon during the exam.  It is up to you to get the results of your procedure. Ask your health care provider, or the department performing the procedure, when your results will be ready. This information is not intended to replace advice given to you by your health care provider. Make sure you discuss any questions you have with your health care provider. Document Released: 04/15/2000 Document Revised: 02/17/2016 Document Reviewed: 06/30/2015 Elsevier Interactive Patient Education  2018 [employer]

## 2017-03-15 DIAGNOSIS — E871 Hypo-osmolality and hyponatremia: Secondary | ICD-10-CM | POA: Diagnosis not present

## 2017-03-15 DIAGNOSIS — I10 Essential (primary) hypertension: Secondary | ICD-10-CM | POA: Diagnosis not present

## 2017-03-15 DIAGNOSIS — E7800 Pure hypercholesterolemia, unspecified: Secondary | ICD-10-CM | POA: Diagnosis not present

## 2017-03-30 ENCOUNTER — Other Ambulatory Visit: Payer: Self-pay | Admitting: General Surgery

## 2017-04-04 ENCOUNTER — Encounter
Admission: RE | Disposition: A | Discharge status: Home / Self Care | Payer: Self-pay | Source: Ambulatory Visit | Attending: General Surgery

## 2017-04-04 ENCOUNTER — Ambulatory Visit: Payer: PPO | Admitting: Registered Nurse

## 2017-04-04 ENCOUNTER — Ambulatory Visit
Admission: RE | Admit: 2017-04-04 | Discharge: 2017-04-04 | Disposition: A | Discharge status: Home / Self Care | Payer: PPO | Source: Ambulatory Visit | Attending: General Surgery | Admitting: General Surgery

## 2017-04-04 ENCOUNTER — Encounter: Payer: Self-pay | Admitting: *Deleted

## 2017-04-04 DIAGNOSIS — F419 Anxiety disorder, unspecified: Secondary | ICD-10-CM | POA: Diagnosis not present

## 2017-04-04 DIAGNOSIS — E119 Type 2 diabetes mellitus without complications: Secondary | ICD-10-CM | POA: Diagnosis not present

## 2017-04-04 DIAGNOSIS — Z87891 Personal history of nicotine dependence: Secondary | ICD-10-CM | POA: Insufficient documentation

## 2017-04-04 DIAGNOSIS — Z7984 Long term (current) use of oral hypoglycemic drugs: Secondary | ICD-10-CM | POA: Insufficient documentation

## 2017-04-04 DIAGNOSIS — Z8371 Family history of colonic polyps: Secondary | ICD-10-CM | POA: Insufficient documentation

## 2017-04-04 DIAGNOSIS — K5730 Diverticulosis of large intestine without perforation or abscess without bleeding: Secondary | ICD-10-CM | POA: Insufficient documentation

## 2017-04-04 DIAGNOSIS — Z800 Family history of malignant neoplasm of digestive organs: Secondary | ICD-10-CM | POA: Diagnosis not present

## 2017-04-04 DIAGNOSIS — I10 Essential (primary) hypertension: Secondary | ICD-10-CM | POA: Insufficient documentation

## 2017-04-04 DIAGNOSIS — Z1211 Encounter for screening for malignant neoplasm of colon: Secondary | ICD-10-CM | POA: Diagnosis not present

## 2017-04-04 DIAGNOSIS — H409 Unspecified glaucoma: Secondary | ICD-10-CM | POA: Insufficient documentation

## 2017-04-04 DIAGNOSIS — Z79899 Other long term (current) drug therapy: Secondary | ICD-10-CM | POA: Insufficient documentation

## 2017-04-04 DIAGNOSIS — M1990 Unspecified osteoarthritis, unspecified site: Secondary | ICD-10-CM | POA: Diagnosis not present

## 2017-04-04 DIAGNOSIS — K5790 Diverticulosis of intestine, part unspecified, without perforation or abscess without bleeding: Secondary | ICD-10-CM | POA: Diagnosis not present

## 2017-04-04 DIAGNOSIS — F329 Major depressive disorder, single episode, unspecified: Secondary | ICD-10-CM | POA: Diagnosis not present

## 2017-04-04 DIAGNOSIS — I2510 Atherosclerotic heart disease of native coronary artery without angina pectoris: Secondary | ICD-10-CM | POA: Diagnosis not present

## 2017-04-04 DIAGNOSIS — Z7982 Long term (current) use of aspirin: Secondary | ICD-10-CM | POA: Insufficient documentation

## 2017-04-04 HISTORY — DX: Unspecified right bundle-branch block: I45.10

## 2017-04-04 HISTORY — PX: COLONOSCOPY WITH PROPOFOL: SHX5780

## 2017-04-04 LAB — GLUCOSE, CAPILLARY: Glucose-Capillary: 136 mg/dL — ABNORMAL HIGH (ref 65–99)

## 2017-04-04 SURGERY — COLONOSCOPY WITH PROPOFOL
Anesthesia: General

## 2017-04-04 MED ORDER — SODIUM CHLORIDE 0.9 % IV SOLN
INTRAVENOUS | Status: DC | PRN
  Administered 2017-04-04: 15:00:00 via INTRAVENOUS

## 2017-04-04 MED ORDER — PROPOFOL 10 MG/ML IV BOLUS
INTRAVENOUS | Status: DC | PRN
  Administered 2017-04-04: 20 mg via INTRAVENOUS
  Administered 2017-04-04: 10 mg via INTRAVENOUS
  Administered 2017-04-04: 70 mg via INTRAVENOUS
  Administered 2017-04-04 (×2): 20 mg via INTRAVENOUS
  Administered 2017-04-04: 10 mg via INTRAVENOUS

## 2017-04-04 MED ORDER — PROPOFOL 500 MG/50ML IV EMUL
INTRAVENOUS | Status: DC | PRN
  Administered 2017-04-04: 140 ug/kg/min via INTRAVENOUS

## 2017-04-04 MED ORDER — LACTATED RINGERS IV SOLN: INTRAVENOUS | Status: DC | PRN

## 2017-04-04 NOTE — Transfer of Care (Signed)
Immediate Anesthesia Transfer of Care Note  Patient: Jessica Wheeler  Procedure(s) Performed: COLONOSCOPY WITH PROPOFOL (N/A )  Patient Location: PACU  Anesthesia Type:General  Level of Consciousness: awake, alert  and oriented  Airway & Oxygen Therapy: Patient Spontanous Breathing and Patient connected to nasal cannula oxygen  Post-op Assessment: Report given to RN and Post -op Vital signs reviewed and stable  Post vital signs: Reviewed and stable  Last Vitals:  Vitals:   04/04/17 1303 04/04/17 1534  BP: (!) 148/71 (!) 109/51  Pulse: 75 77  Resp: 20 13  Temp: (!) 36.1 C (!) 36.1 C  SpO2:  97%    Last Pain:  Vitals:   04/04/17 1303  TempSrc: Tympanic      Patients Stated Pain Goal: 0 (04/04/17 1303)  Complications: No apparent anesthesia complications

## 2017-04-04 NOTE — Op Note (Signed)
[facility] Patient Name: Jessica Wheeler Procedure Date: 04/04/2017 2:46 PM MRN: [id number] Account #: [id number] Date of Birth: 03/26/1941 Admit Type: Outpatient Age: [age] [facility] Gender: Female Note Status: Finalized Procedure:            Colonoscopy Indications:          Screening in patient at increased risk: Family history                        of 1st-degree relative with colorectal cancer, Family                        history of colonic polyps in a first-degree relative Providers:            Anshul Meddings G. Evette CristalSankar, MD Referring MD:         Duane LopeJeffrey D. Sparks, MD (Referring MD) Medicines:            Midazolam mg IV Complications:        No immediate complications. Procedure:            Pre-Anesthesia Assessment:                       - General anesthesia under the supervision of an                        anesthesiologist was determined to be medically                        necessary for this procedure based on review of the                        patient's medical history, medications, and prior                        anesthesia history.                       After obtaining informed consent, the colonoscope was                        passed under direct vision. Throughout the procedure,                        the patient's blood pressure, pulse, and oxygen                        saturations were monitored continuously. The                        Colonoscope was introduced through the anus and                        advanced to the the cecum, identified by the ileocecal                        valve. The colonoscopy was performed without                        difficulty. The patient tolerated the procedure well.  The quality of the bowel preparation was good. Findings:      The perianal and digital rectal examinations were normal.      Multiple small and large-mouthed diverticula were found in the sigmoid        colon.      The exam was otherwise without abnormality. Impression:           - Diverticulosis in the sigmoid colon.                       - The examination was otherwise normal.                       - No specimens collected. Recommendation:       - Discharge patient to home.                       - Resume previous diet.                       - Continue present medications.                       - Repeat colonoscopy in 5 years for surveillance. Procedure Code(s):    --- Professional ---                       [phone], Colonoscopy, flexible; diagnostic, including                        collection of specimen(s) by brushing or washing, when                        performed (separate procedure) Diagnosis Code(s):    --- Professional ---                       Z80.0, Family history of malignant neoplasm of                        digestive organs                       Z83.71, Family history of colonic polyps                       K57.30, Diverticulosis of large intestine without                        perforation or abscess without bleeding CPT copyright 2016 American Medical Association. All rights reserved. The codes documented in this report are preliminary and upon coder review may  be revised to meet current compliance requirements. Kieth BrightlySeeplaputhur G Shabre Kreher, MD 04/04/2017 3:36:05 PM This report has been signed electronically. Number of Addenda: 0 Note Initiated On: 04/04/2017 2:46 PM Scope Withdrawal Time: 0 hours 7 minutes 39 seconds  Total Procedure Duration: 0 hours 37 minutes 1 second       [facility]

## 2017-04-04 NOTE — Anesthesia Preprocedure Evaluation (Signed)
Anesthesia Evaluation  Patient identified by MRN, date of birth, ID band  Reviewed: Allergy & Precautions, H&P , NPO status , Patient's Chart, lab work & pertinent test results  Airway Mallampati: II  TM Distance: >3 FB Neck ROM: full    Dental  (+) Upper Dentures, Partial Lower, Caps   Pulmonary former smoker,    Pulmonary exam normal        Cardiovascular hypertension, + CAD and + Peripheral Vascular Disease  + Valvular Problems/Murmurs  Rhythm:regular Rate:Normal     Neuro/Psych PSYCHIATRIC DISORDERS Anxiety Depression    GI/Hepatic   Endo/Other  diabetes, Well Controlled, Type 2, Oral Hypoglycemic Agents  Renal/GU Renal disease     Musculoskeletal  (+) Arthritis , Osteoarthritis,    Abdominal   Peds  Hematology   Anesthesia Other Findings Past Medical History: No date: Arthritis No date: Bundle branch block, right No date: Diabetes mellitus No date: Diffuse cystic mastopathy No date: Dissection, aorta (HCC)     Comment:  Dr.Dew follows No date: Diverticulosis No date: Family history of malignant neoplasm of gastrointestinal  tract 2005: Glaucoma No date: History of bronchitis 2005: History of pancreatitis 1982: Hypertension No date: Personal history of tobacco use, presenting hazards to health No date: RBBB No date: Ulcer  Reproductive/Obstetrics                             Anesthesia Physical  Anesthesia Plan  ASA: III  Anesthesia Plan: General   Post-op Pain Management:    Induction:   PONV Risk Score and Plan:   Airway Management Planned: Nasal Cannula  Additional Equipment:   Intra-op Plan:   Post-operative Plan:   Informed Consent: I have reviewed the patients History and Physical, chart, labs and discussed the procedure including the risks, benefits and alternatives for the proposed anesthesia with the patient or authorized representative who has indicated  his/her understanding and acceptance.   Dental advisory given  Plan Discussed with: CRNA  Anesthesia Plan Comments:         Anesthesia Quick Evaluation

## 2017-04-04 NOTE — Anesthesia Post-op Follow-up Note (Signed)
Anesthesia QCDR form completed.        

## 2017-04-04 NOTE — Interval H&P Note (Signed)
History and Physical Interval Note:  04/04/2017 2:44 PM  Jessica BrightlyShirley D Sampath  has presented today for surgery, with the diagnosis of FH COLON CA  The various methods of treatment have been discussed with the patient and family. After consideration of risks, benefits and other options for treatment, the patient has consented to  Procedure(s): COLONOSCOPY WITH PROPOFOL (N/A) as a surgical intervention .  The patient's history has been reviewed, patient examined, no change in status, stable for surgery.  I have reviewed the patient's chart and labs.  Questions were answered to the patient's satisfaction.     SANKAR,SEEPLAPUTHUR G

## 2017-04-05 ENCOUNTER — Encounter: Payer: Self-pay | Admitting: General Surgery

## 2017-04-05 NOTE — Anesthesia Postprocedure Evaluation (Signed)
Anesthesia Post Note  Patient: Jessica Wheeler  Procedure(s) Performed: COLONOSCOPY WITH PROPOFOL (N/A )  Patient location during evaluation: PACU Anesthesia Type: General Level of consciousness: awake and alert Pain management: pain level controlled Vital Signs Assessment: post-procedure vital signs reviewed and stable Respiratory status: spontaneous breathing Cardiovascular status: blood pressure returned to baseline Anesthetic complications: no     Last Vitals:  Vitals:   04/04/17 1554 04/04/17 1604  BP: (!) 157/69 (!) 167/92  Pulse: 70 67  Resp: 15 14  Temp:    SpO2: 100% 100%    Last Pain:  Vitals:   04/05/17 0737  TempSrc:   PainSc: 0-No pain                 Jessica Wheeler

## 2017-04-14 DIAGNOSIS — E871 Hypo-osmolality and hyponatremia: Secondary | ICD-10-CM | POA: Diagnosis not present

## 2017-04-14 LAB — BASIC METABOLIC PANEL: Sodium: 129 — AB (ref 137–147)

## 2017-04-25 IMAGING — MG MM SCREENING BREAST TOMO BILATERAL
9 of 12 series · 9 of 28 positions shown · non-contrast
Comparison: Previous exam(s).

CLINICAL DATA: Screening.

EXAM:
DIGITAL SCREENING BILATERAL MAMMOGRAM WITH 3D TOMO WITH CAD

[L MLO]
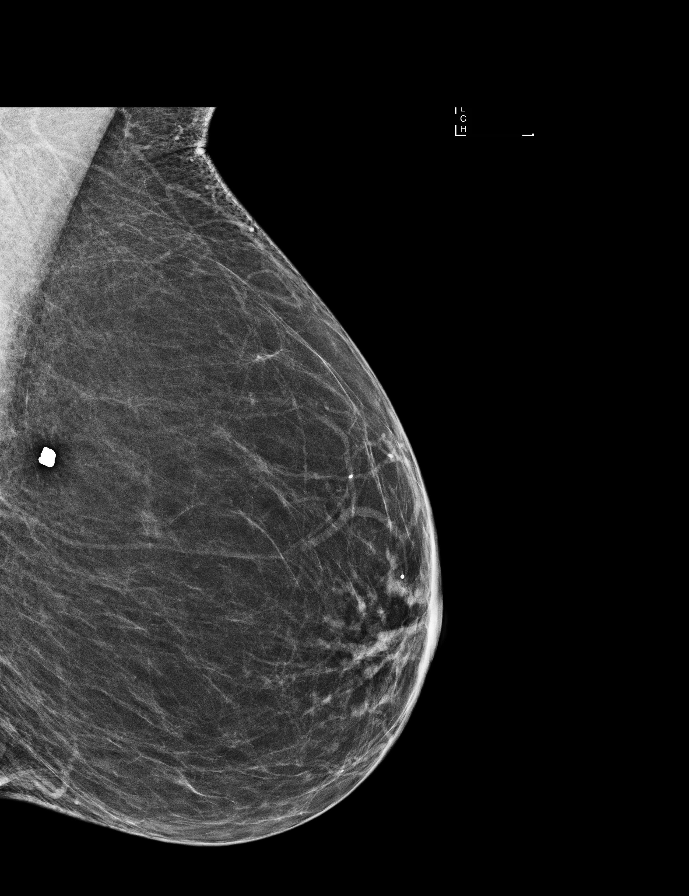

[L CC synth-2D]
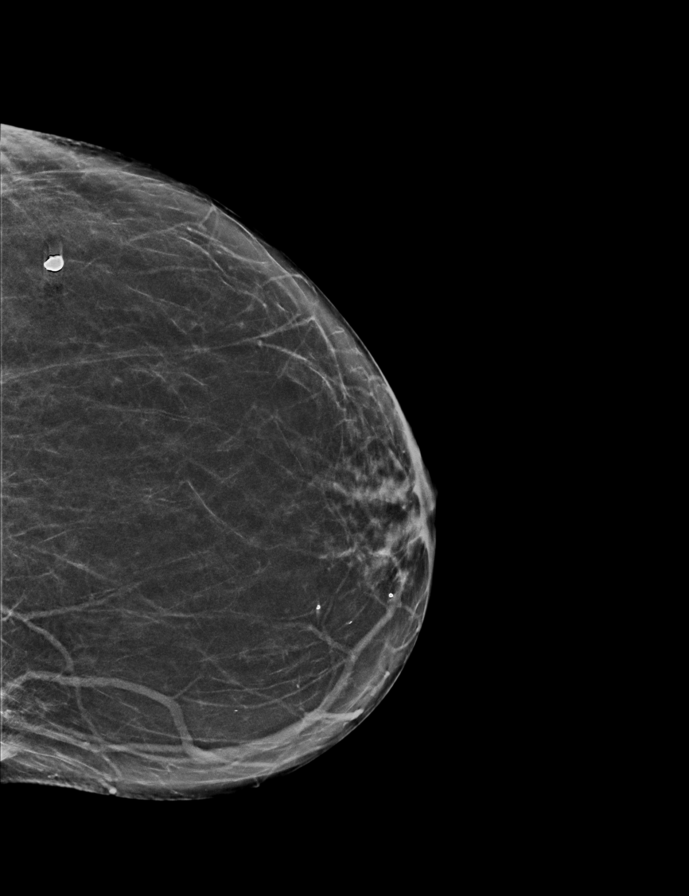

[R CC synth-2D]
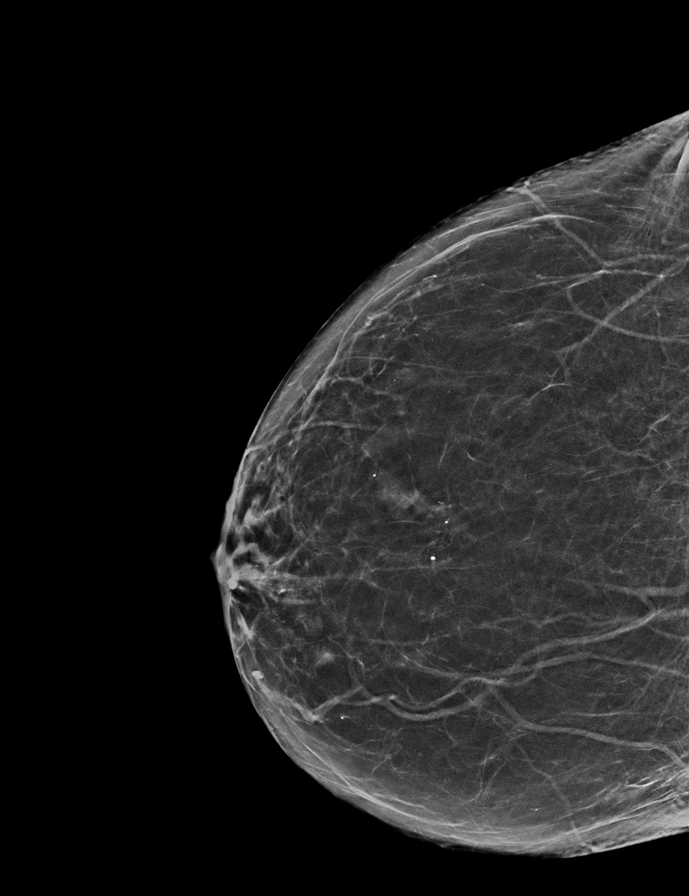

[R CC]
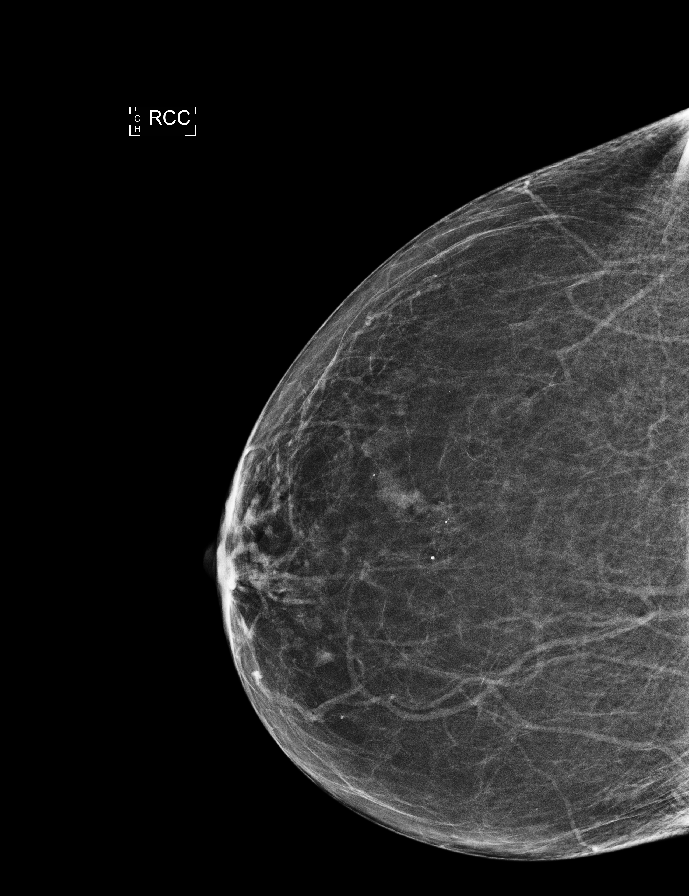

[L MLO synth-2D]
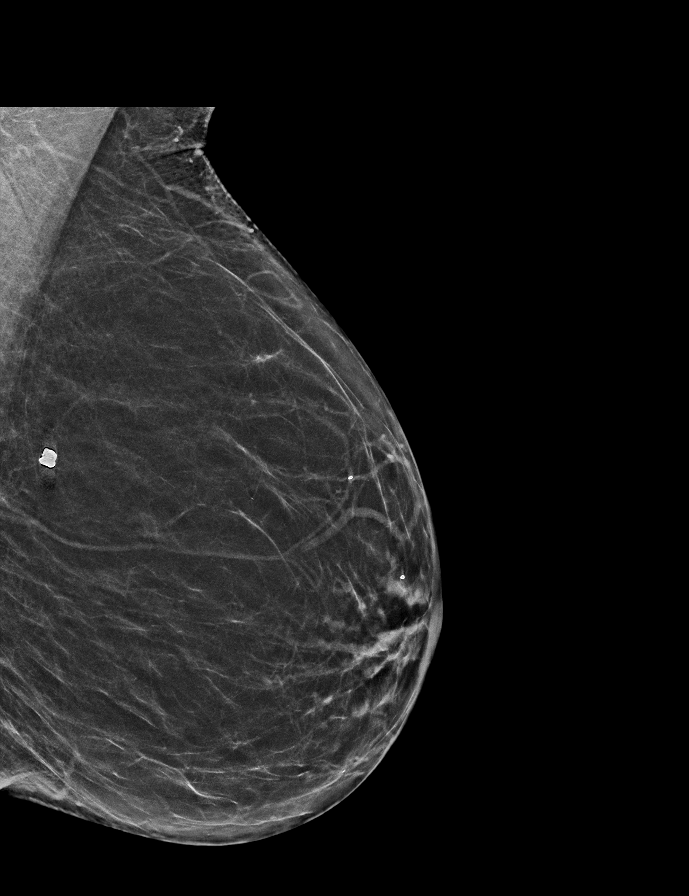

[R MLO synth-2D]
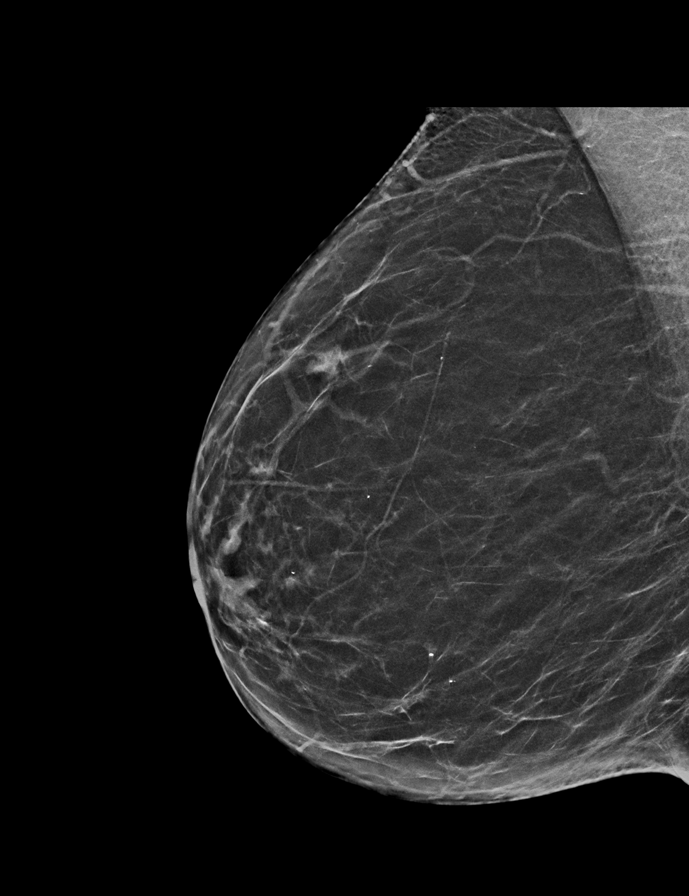

[R MLO]
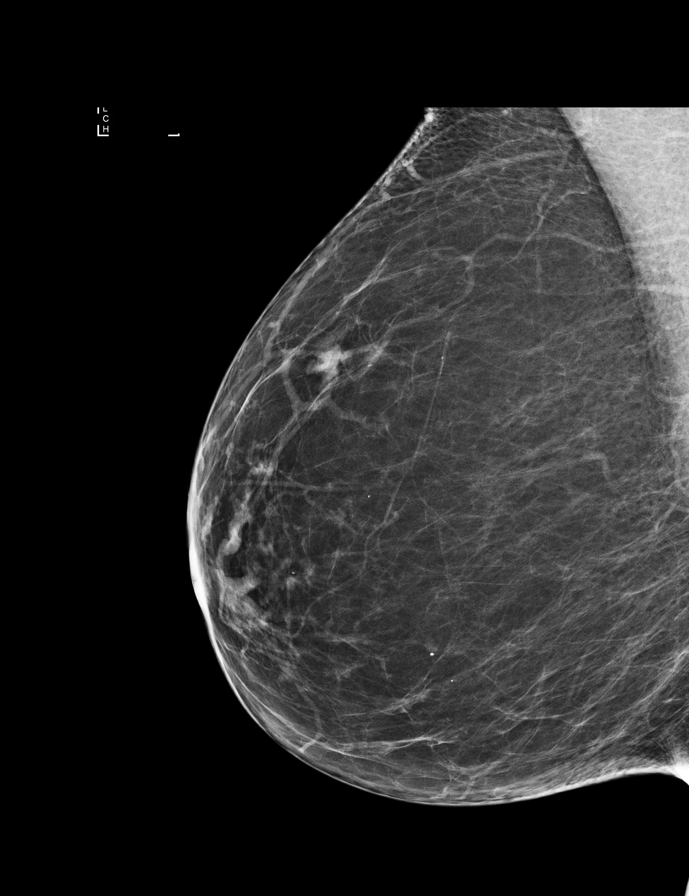

[L CC]
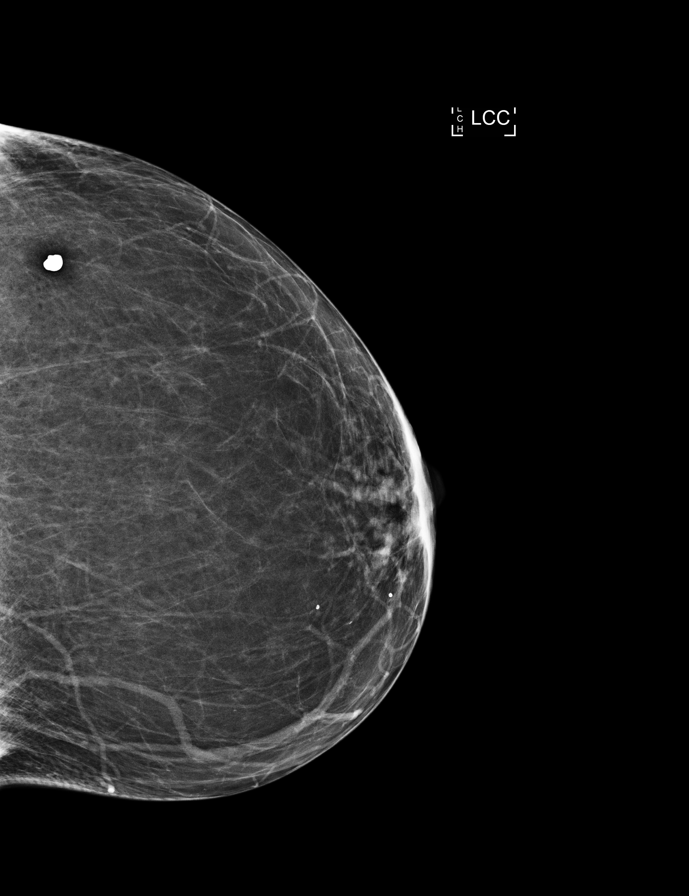

[L MLO tomo · tomo slice 27/53.0]
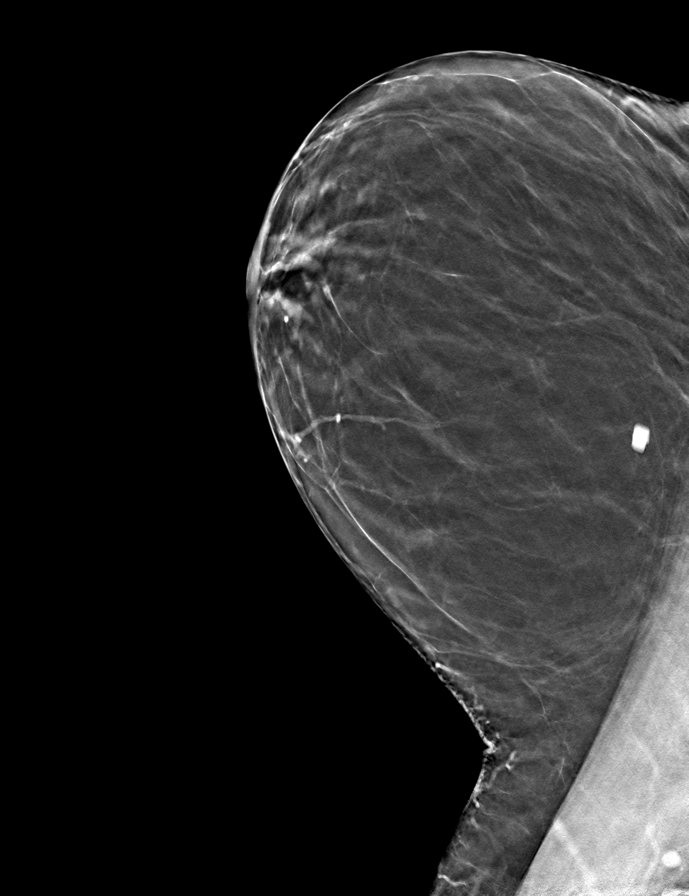

[9 of 28 positions shown; findings below may reference images not displayed]

ACR Breast Density Category b: There are scattered areas of
fibroglandular density.
FINDINGS: There are no findings suspicious for malignancy. Images were
processed with CAD.
IMPRESSION: No mammographic evidence of malignancy. A result letter of this
screening mammogram will be mailed directly to the patient.

RECOMMENDATION:
Screening mammogram in one year. (Code:55-L-23V)

BI-RADS CATEGORY  1: Negative.

## 2017-04-26 ENCOUNTER — Ambulatory Visit
Admission: RE | Admit: 2017-04-26 | Discharge: 2017-04-26 | Disposition: A | Discharge status: Home / Self Care | Payer: PPO | Source: Ambulatory Visit | Attending: General Surgery | Admitting: General Surgery

## 2017-04-26 DIAGNOSIS — Z1231 Encounter for screening mammogram for malignant neoplasm of breast: Secondary | ICD-10-CM | POA: Diagnosis not present

## 2017-05-11 ENCOUNTER — Ambulatory Visit (INDEPENDENT_AMBULATORY_CARE_PROVIDER_SITE_OTHER): Payer: PPO | Admitting: Family Medicine

## 2017-05-11 ENCOUNTER — Encounter: Payer: Self-pay | Admitting: Family Medicine

## 2017-05-11 ENCOUNTER — Other Ambulatory Visit: Payer: Self-pay

## 2017-05-11 VITALS — BP 122/62 | HR 80 | Temp 97.6°F | Resp 16 | Ht 63.0 in | Wt 143.0 lb

## 2017-05-11 DIAGNOSIS — E1159 Type 2 diabetes mellitus with other circulatory complications: Secondary | ICD-10-CM | POA: Diagnosis not present

## 2017-05-11 DIAGNOSIS — I25118 Atherosclerotic heart disease of native coronary artery with other forms of angina pectoris: Secondary | ICD-10-CM

## 2017-05-11 DIAGNOSIS — E871 Hypo-osmolality and hyponatremia: Secondary | ICD-10-CM

## 2017-05-11 DIAGNOSIS — J301 Allergic rhinitis due to pollen: Secondary | ICD-10-CM | POA: Diagnosis not present

## 2017-05-11 DIAGNOSIS — I10 Essential (primary) hypertension: Secondary | ICD-10-CM

## 2017-05-11 DIAGNOSIS — E782 Mixed hyperlipidemia: Secondary | ICD-10-CM | POA: Diagnosis not present

## 2017-05-11 DIAGNOSIS — N6019 Diffuse cystic mastopathy of unspecified breast: Secondary | ICD-10-CM | POA: Diagnosis not present

## 2017-05-11 NOTE — Assessment & Plan Note (Signed)
Advised patient that she can take Zyrtec or other second gen antihistamine as well as flonase seasonally for symptom management We could consider Singulair in the future

## 2017-05-11 NOTE — Assessment & Plan Note (Signed)
Likely related to very low sodium diet and HCTZ use Not willing to stop diuretic so will continue Continue to monitor

## 2017-05-11 NOTE — Progress Notes (Signed)
Patient: Jessica Wheeler, Female    DOB: 1940/10/07, 77 y.o.   MRN: [id number] Visit Date: 05/11/2017  Today's Provider: Shirlee Latch, MD   Chief Complaint  Patient presents with  . Establish Care   Subjective:    Establish Care Jessica Wheeler is a 77 y.o. female who presents today for health maintenance and to re-establish care. She left [facility] after her previous PCP, Dr. Elease Hashimoto, relocated. She went to [facility] for care, then establish at [facility] for primary care. She was unhappy with her provider at both of these practices.   She feels fairly well. She states she has "tightness" in her right hip.  She reports exercising daily. Walks for 2 miles. States that after back surgery she had foot drop in R leg and walked to make this better.  She reports she is sleeping fairly well.  Pt has a H/O DM. Last A1C was 03/08/2017, and was 6.6%.  Pt has a H/O hyponatremia. Her HCTZ was D/C due to this. She reports she retained so much fluid while this was D/C that she acquired a baker's cyst in her left popliteal. Her cardiologist restarted her amiloride-HCTZ to improve edema. She states she does not like the taste of salt, and can not make herself consume this.  She would like to discuss the recall on Losartan and Amlodipine. -----------------------------------------------------------------   Review of Systems  Constitutional: Negative.   HENT: Positive for ear discharge, ear pain, hearing loss and postnasal drip. Negative for congestion, dental problem, drooling, facial swelling, mouth sores, nosebleeds, rhinorrhea, sinus pressure, sinus pain, sneezing, sore throat, tinnitus, trouble swallowing and voice change.   Eyes: Negative.   Respiratory: Negative.   Cardiovascular: Negative.   Gastrointestinal: Negative.   Endocrine: Negative.   Genitourinary: Negative.   Musculoskeletal: Positive for joint swelling. Negative for arthralgias, back pain, gait  problem, myalgias, neck pain and neck stiffness.  Skin: Negative.   Allergic/Immunologic: Negative.   Neurological: Negative.   Hematological: Negative.   Psychiatric/Behavioral: Negative.     Social History      She  reports that she quit smoking about 13 years ago. Her smoking use included cigarettes. She has a 15.00 pack-year smoking history. she has never used smokeless tobacco. She reports that she does not drink alcohol or use drugs.       Social History   Socioeconomic History  . Marital status: Widowed    Spouse name: None  . Number of children: 2  . Years of education: 14  . Highest education level: Associate degree: academic program  Social Needs  . Financial resource strain: Not hard at all  . Food insecurity - worry: Never true  . Food insecurity - inability: Never true  . Transportation needs - medical: No  . Transportation needs - non-medical: No  Occupational History  . Occupation: Retired  Tobacco Use  . Smoking status: Former Smoker    Packs/day: 1.00    Years: 15.00    Pack years: 15.00    Types: Cigarettes    Last attempt to quit: 08/03/2003    Years since quitting: 13.7  . Smokeless tobacco: Never Used  Substance and Sexual Activity  . Alcohol use: No  . Drug use: No  . Sexual activity: None  Other Topics Concern  . None  Social History Narrative  . None    Past Medical History:  Diagnosis Date  . Allergy   . Arthritis   .  Bundle branch block, right   . Diabetes mellitus   . Diffuse cystic mastopathy   . Dissection, aorta (HCC)    Dr.Dew follows  . Diverticulosis   . Family history of malignant neoplasm of gastrointestinal tract   . Glaucoma 2005  . Heart murmur   . History of bronchitis   . History of pancreatitis 2005  . Hypertension 1982  . Personal history of tobacco use, presenting hazards to health   . RBBB   . Ulcer      Patient Active Problem List   Diagnosis Date Noted  . Primary osteoarthritis of left knee 03/10/2016    . Depression 12/28/2015  . Allergic rhinitis 01/31/2015  . Benign paroxysmal positional nystagmus 01/31/2015  . Bladder infection, chronic 01/31/2015  . Glaucoma 01/31/2015  . Alopecia 01/31/2015  . Chronic hyponatremia 01/31/2015  . Cramp in muscle 01/31/2015  . 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 Anxiety 11/04/2014  . Muscle weakness 06/24/2014  . Fibrocystic breast disease 04/30/2013  . 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 Renal artery stenosis (HCC) 04/18/2012  . Murmur 10/10/2011  . 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HCC) 03/31/2011  . HTN (hypertension) 08/03/2010  . CAD (coronary artery disease) 08/03/2010  . DM (diabetes mellitus) (HCC) 08/03/2010  . Hyperlipidemia 08/03/2010    Past Surgical History:  Procedure Laterality Date  . ABDOMINAL HYSTERECTOMY  1991  . APPENDECTOMY    . BACK SURGERY  1982   for lumbar ruptured disc. No hardware, no discectomy, no fusion  . BLADDER SURGERY    . BREAST BIOPSY Right 1980's  . BREAST SURGERY     biopsy  . CARDIAC CATHETERIZATION     Dr. Lady Gary, few years ago  . CATARACT EXTRACTION W/PHACO Left 08/31/2015   Procedure: CATARACT EXTRACTION PHACO AND INTRAOCULAR LENS PLACEMENT (IOC);  Surgeon: Sherald Hess, MD;  Location: [facility];  Service: Ophthalmology;  Laterality: Left;  DIABETIC - oral meds  . CATARACT EXTRACTION W/PHACO Right 10/12/2015   Procedure: CATARACT EXTRACTION PHACO AND INTRAOCULAR LENS PLACEMENT (IOC) right eye;  Surgeon: Sherald Hess, MD;  Location: [facility];  Service: Ophthalmology;  Laterality: Right;  DIABETIC - oral meds  . CHOLECYSTECTOMY  1994  . COLONOSCOPY  2008,02/22/2012   Dr. Sankar-2013  . COLONOSCOPY WITH PROPOFOL N/A 04/04/2017   Procedure: COLONOSCOPY WITH PROPOFOL;  Surgeon: [name], MD;  Location: [facility];  Service: Endoscopy;  Laterality: N/A;    Family History        Family Status  Relation Name Status  . Mother   Deceased  . Father  Deceased at age 80  . Brother  Deceased at age 70  . MGM  Deceased  . MGF  Deceased  . Mat Aunt  Deceased  . Cousin  (Not Specified)        Her family history includes Anxiety disorder in her mother; Breast cancer (age of onset: 11) in her cousin; Breast cancer (age of onset: 47) in her maternal aunt; Colon cancer in her brother; Diabetes in her mother; Heart attack in her father; Heart disease in her father; Hypertension in her maternal grandfather, maternal grandmother, and mother; Rheum arthritis in her father.     Allergies  Allergen Reactions  . Beta Adrenergic Blockers     Chest pressure/difficulty breathing  . Levaquin [Levofloxacin In D5w]     Low blood sugar; does take this when she needs it. She just makes sure to control her BS to avoid hypoglycemia.  Marland Kitchen  Lisinopril Cough  . Prevnar [Pneumococcal 13-Val Conj Vacc] Rash    Joint Pain  . Sulfa Antibiotics Rash    "makes my skin raw"     Current Outpatient Medications:  .  amiloride-hydrochlorothiazide (MODURETIC) 5-50 MG tablet, Take 1 tablet by mouth daily., Disp: 30 tablet, Rfl: 11 .  amLODipine (NORVASC) 5 MG tablet, TAKE 1 TABLET BY MOUTH EVERY DAY, Disp: 90 tablet, Rfl: 3 .  aspirin 81 MG EC tablet, Take 81 mg by mouth daily.  , Disp: , Rfl:  .  clotrimazole (LOTRIMIN) 1 % cream, Apply 1 application topically 2 (two) times daily., Disp: , Rfl:  .  Ibuprofen (ADVIL) 200 MG CAPS, Take 1 capsule by mouth daily.  , Disp: , Rfl:  .  latanoprost (XALATAN) 0.005 % ophthalmic solution, Reported on 10/12/2015, Disp: , Rfl: 3 .  losartan (COZAAR) 100 MG tablet, Take 1 tablet (100 mg total) by mouth daily., Disp: 90 tablet, Rfl: 0 .  metFORMIN (GLUCOPHAGE) 500 MG tablet, TAKE 2 TABLETS(1000 MG) BY MOUTH TWICE DAILY, Disp: 360 tablet, Rfl: 0 .  vitamin B-12 (CYANOCOBALAMIN) 1000 MCG tablet, Take 1,000 mcg by mouth daily., Disp: , Rfl:  .  polyethylene glycol powder (GLYCOLAX/MIRALAX) powder, 255 grams one bottle  for colonoscopy prep (Patient not taking: Reported on 05/11/2017), Disp: 255 g, Rfl: 0   Patient Care Team: Erasmo Downer, MD as PCP - General (Family Medicine) [name], MD (General Surgery) Antonieta Iba, MD as Consulting Physician (Cardiology)      Objective:   Vitals: BP 122/62 (BP Location: Left Arm, Patient Position: Sitting, Cuff Size: Normal)   Pulse 80   Temp 97.6 F (36.4 C) (Oral)   Resp 16   Ht 5\' 3"  (1.6 m)   Wt 143 lb (64.9 kg)   SpO2 98%   BMI 25.33 kg/m    Vitals:   05/11/17 0928  BP: 122/62  Pulse: 80  Resp: 16  Temp: 97.6 F (36.4 C)  TempSrc: Oral  SpO2: 98%  Weight: 143 lb (64.9 kg)  Height: 5\' 3"  (1.6 m)     Physical Exam  Constitutional: She is oriented to person, place, and time. She appears well-developed and well-nourished. No distress.  HENT:  Head: Normocephalic and atraumatic.  Eyes: Conjunctivae and EOM are normal. No scleral icterus.  Neck: Neck supple. No thyromegaly present.  Cardiovascular: Normal rate, regular rhythm, normal heart sounds and intact distal pulses.  No murmur heard. Pulmonary/Chest: Breath sounds normal. No respiratory distress. She has no wheezes. She has no rales.  Abdominal: Soft. Bowel sounds are normal. She exhibits no distension. There is no tenderness. There is no rebound and no guarding.  Musculoskeletal: She exhibits no edema or deformity.  Lymphadenopathy:    She has no cervical adenopathy.  Neurological: She is alert and oriented to person, place, and time.  Skin: Skin is warm and dry. No rash noted.  Psychiatric: She has a normal mood and affect. Her behavior is normal.  Vitals reviewed.   Diabetic Foot Exam - Simple   Simple Foot Form Diabetic Foot exam was performed with the following findings:  Yes 05/11/2017 11:42 AM  Visual Inspection No deformities, no ulcerations, no other skin breakdown bilaterally:  Yes Sensation Testing Intact to touch and monofilament testing  bilaterally:  Yes Pulse Check Posterior Tibialis and Dorsalis pulse intact bilaterally:  Yes Comments      Depression Screen PHQ 2/9 Scores 05/11/2017 12/28/2015 11/04/2014  PHQ - 2 Score 2 2  1  PHQ- 9 Score 6 13 -     Assessment & Plan:      Problem List Items Addressed This Visit      Cardiovascular and Mediastinum   HTN (hypertension) - Primary    Well controlled Continue current meds Also seeing cardiology Does have chronic hyponatremia but needs HCTZ for symptom control Will continue to monitor sodium levels      CAD (coronary artery disease)    Currently asymptomatic Followed by cardiology Continue current meds        Respiratory   Allergic rhinitis    Advised patient that she can take Zyrtec or other second gen antihistamine as well as flonase seasonally for symptom management We could consider Singulair in the future        Endocrine   DM (diabetes mellitus) (HCC)    Well controlled with last A1c 6.6 Foot exam completed today Eye exam schedyuled for next month On ACEi UTD on vaccines F/u in 4 months and recheck A1c        Other   Hyperlipidemia    Reviewed last lipid panel Not due for recheck Not taking statin Continue to monitor Discussed diet and exercise      Fibrocystic breast disease    Followed by Gen Surg Getting annual mammograms      Chronic hyponatremia    Likely related to very low sodium diet and HCTZ use Not willing to stop diuretic so will continue Continue to monitor         Return in about 6 weeks (around 06/22/2017) for AWV/CPE.  --------------------------------------------------------------------  The entirety of the information documented in the History of Present Illness, Review of Systems and Physical Exam were personally obtained by me. Portions of this information were initially documented by Irving BurtonEmily Ratchford, CMA and reviewed by me for thoroughness and accuracy.    Erasmo DownerBacigalupo, Loetta Connelley M, MD, MPH [facility] 05/11/2017 11:42 AM

## 2017-05-11 NOTE — Assessment & Plan Note (Signed)
Reviewed last lipid panel Not due for recheck Not taking statin Continue to monitor Discussed diet and exercise

## 2017-05-11 NOTE — Assessment & Plan Note (Signed)
Followed by Gen Surg Getting annual mammograms

## 2017-05-11 NOTE — Assessment & Plan Note (Signed)
Well controlled Continue current meds Also seeing cardiology Does have chronic hyponatremia but needs HCTZ for symptom control Will continue to monitor sodium levels

## 2017-05-11 NOTE — Assessment & Plan Note (Signed)
Well controlled with last A1c 6.6 Foot exam completed today Eye exam schedyuled for next month On ACEi UTD on vaccines F/u in 4 months and recheck A1c

## 2017-05-11 NOTE — Patient Instructions (Signed)

## 2017-05-11 NOTE — Assessment & Plan Note (Signed)
Currently asymptomatic Followed by cardiology Continue current meds

## 2017-05-15 ENCOUNTER — Encounter: Payer: Self-pay | Admitting: General Surgery

## 2017-05-26 DIAGNOSIS — E119 Type 2 diabetes mellitus without complications: Secondary | ICD-10-CM | POA: Diagnosis not present

## 2017-05-26 LAB — HM DIABETES EYE EXAM

## 2017-05-29 ENCOUNTER — Ambulatory Visit (INDEPENDENT_AMBULATORY_CARE_PROVIDER_SITE_OTHER): Payer: PPO | Admitting: Physician Assistant

## 2017-05-29 ENCOUNTER — Encounter: Payer: Self-pay | Admitting: Physician Assistant

## 2017-05-29 VITALS — BP 158/62 | HR 100 | Temp 98.7°F | Resp 16 | Wt 142.0 lb

## 2017-05-29 DIAGNOSIS — R69 Illness, unspecified: Secondary | ICD-10-CM

## 2017-05-29 DIAGNOSIS — J111 Influenza due to unidentified influenza virus with other respiratory manifestations: Secondary | ICD-10-CM

## 2017-05-29 LAB — POCT INFLUENZA A/B
Influenza A, POC: NEGATIVE
Influenza B, POC: NEGATIVE

## 2017-05-29 MED ORDER — OSELTAMIVIR PHOSPHATE 75 MG PO CAPS: 75.0000 mg | ORAL_CAPSULE | Freq: Two times a day (BID) | ORAL | 0 refills | Status: AC

## 2017-05-29 NOTE — Patient Instructions (Signed)

## 2017-05-29 NOTE — Progress Notes (Signed)
[facility]  Chief Complaint  Patient presents with  . URI    Started 3 days ago.    Subjective:    Patient ID: Jessica Wheeler, female    DOB: Jan 20, 1941, 77 y.o.   MRN: [id number]  Upper Respiratory Infection: Jessica Wheeler is a 77 y.o. female with a past medical history significant for Type II DM, HTN, and Renal artery stenosis complaining of symptoms of a URI, possible sinusitis. Symptoms include congestion, cough, fever, plugged sensation in both ears and sore throat. Onset of symptoms was 3 days ago, gradually worsening since that time. She also c/o achiness, congestion, post nasal drip, sinus pressure, sore throat and wheezing for the past 3 days .  She is drinking plenty of fluids. Evaluation to date: none. Treatment to date: cough suppressants. The treatment has provided minimal relief.   Review of Systems  Constitutional: Positive for chills, diaphoresis, fatigue and fever (Temp of 101 this morning.). Negative for activity change, appetite change and unexpected weight change.  HENT: Positive for congestion, postnasal drip, rhinorrhea, sinus pressure, sinus pain, sneezing and sore throat. Negative for ear discharge, ear pain, nosebleeds and tinnitus.   Eyes: Positive for discharge.  Respiratory: Positive for cough, chest tightness, shortness of breath and wheezing. Negative for apnea, choking and stridor.   Gastrointestinal: Negative.   Musculoskeletal: Positive for myalgias. Negative for arthralgias, back pain, gait problem, joint swelling, neck pain and neck stiffness.  Neurological: Positive for headaches. Negative for dizziness and light-headedness.       Objective:   There were no vitals taken for this visit.  Patient Active Problem List   Diagnosis Date Noted  . Primary osteoarthritis of left knee 03/10/2016  . Depression 12/28/2015  . Allergic rhinitis 01/31/2015  . Benign paroxysmal positional nystagmus 01/31/2015  .  Bladder infection, chronic 01/31/2015  . Glaucoma 01/31/2015  . Alopecia 01/31/2015  . Chronic hyponatremia 01/31/2015  . Cramp in muscle 01/31/2015  . 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 Anxiety 11/04/2014  . Muscle weakness 06/24/2014  . Fibrocystic breast disease 04/30/2013  . 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 Renal artery stenosis (HCC) 04/18/2012  . Murmur 10/10/2011  . 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HCC) 03/31/2011  . HTN (hypertension) 08/03/2010  . CAD (coronary artery disease) 08/03/2010  . DM (diabetes mellitus) (HCC) 08/03/2010  . Hyperlipidemia 08/03/2010    Outpatient Encounter Medications as of 05/29/2017  Medication Sig Note  . amiloride-hydrochlorothiazide (MODURETIC) 5-50 MG tablet Take 1 tablet by mouth daily.   Marland Kitchen. amLODipine (NORVASC) 5 MG tablet TAKE 1 TABLET BY MOUTH EVERY DAY   . aspirin 81 MG EC tablet Take 81 mg by mouth daily.     . clotrimazole (LOTRIMIN) 1 % cream Apply 1 application topically 2 (two) times daily.   . Ibuprofen (ADVIL) 200 MG CAPS Take 1 capsule by mouth daily.     Marland Kitchen. latanoprost (XALATAN) 0.005 % ophthalmic solution Reported on 10/12/2015 04/28/2015: Received from: External Pharmacy  . losartan (COZAAR) 100 MG tablet Take 1 tablet (100 mg total) by mouth daily.   . metFORMIN (GLUCOPHAGE) 500 MG tablet TAKE 2 TABLETS(1000 MG) BY MOUTH TWICE DAILY   . polyethylene glycol powder (GLYCOLAX/MIRALAX) powder 255 grams one bottle for colonoscopy prep (Patient not taking: Reported on 05/11/2017)   . vitamin B-12 (CYANOCOBALAMIN) 1000 MCG tablet Take 1,000 mcg by mouth daily.    No facility-administered encounter medications on file as of 05/29/2017.     Allergies  Allergen Reactions  . Beta Adrenergic Blockers     Chest pressure/difficulty breathing  . Levaquin [Levofloxacin In D5w]     Low blood sugar; does take this when she needs it. She just makes sure to control her BS to avoid hypoglycemia.  . Lisinopril Cough  . Prevnar  [Pneumococcal 13-Val Conj Vacc] Rash    Joint Pain  . Sulfa Antibiotics Rash    "makes my skin raw"       Physical Exam  Constitutional: She is oriented to person, place, and time. She appears well-developed and well-nourished. She appears ill.  HENT:  Right Ear: Tympanic membrane and external ear normal.  Left Ear: Tympanic membrane and external ear normal.  Mouth/Throat: Posterior oropharyngeal edema and posterior oropharyngeal erythema present. No oropharyngeal exudate.  Eyes: Right eye exhibits no discharge. Left eye exhibits no discharge. Right conjunctiva is injected. Left conjunctiva is injected.  Neck: Neck supple.  Cardiovascular: Normal rate and regular rhythm.  Pulmonary/Chest: Effort normal and breath sounds normal.  Lymphadenopathy:    She has cervical adenopathy.  Neurological: She is alert and oriented to person, place, and time.  Skin: Skin is warm and dry.  Psychiatric: She has a normal mood and affect. Her behavior is normal.       Assessment & Plan:  1. Influenza-like illness  Rapid flu negative, though clinically appears to have the flu. No adverse lung sounds. With her age and comorbidities, do think it is worth while to provide her with Tamiflu. Return precautions discussed.   - oseltamivir (TAMIFLU) 75 MG capsule; Take 1 capsule (75 mg total) by mouth 2 (two) times daily for 5 days.  Dispense: 10 capsule; Refill: 0 - POCT Influenza A/B  Return if symptoms worsen or fail to improve.  The entirety of the information documented in the History of Present Illness, Review of Systems and Physical Exam were personally obtained by me. Portions of this information were initially documented by Kavin Leech, CMA and reviewed by me for thoroughness and accuracy.

## 2017-06-29 ENCOUNTER — Ambulatory Visit (INDEPENDENT_AMBULATORY_CARE_PROVIDER_SITE_OTHER): Payer: PPO | Admitting: Family Medicine

## 2017-06-29 ENCOUNTER — Ambulatory Visit (INDEPENDENT_AMBULATORY_CARE_PROVIDER_SITE_OTHER): Payer: PPO

## 2017-06-29 VITALS — BP 128/60 | HR 88 | Temp 97.4°F | Resp 16 | Ht 63.0 in | Wt 145.0 lb

## 2017-06-29 VITALS — BP 160/72 | HR 88 | Temp 97.4°F | Ht 63.0 in | Wt 145.2 lb

## 2017-06-29 DIAGNOSIS — R600 Localized edema: Secondary | ICD-10-CM

## 2017-06-29 DIAGNOSIS — Z0000 Encounter for general adult medical examination without abnormal findings: Secondary | ICD-10-CM

## 2017-06-29 DIAGNOSIS — E871 Hypo-osmolality and hyponatremia: Secondary | ICD-10-CM | POA: Diagnosis not present

## 2017-06-29 DIAGNOSIS — I10 Essential (primary) hypertension: Secondary | ICD-10-CM | POA: Diagnosis not present

## 2017-06-29 DIAGNOSIS — E1159 Type 2 diabetes mellitus with other circulatory complications: Secondary | ICD-10-CM

## 2017-06-29 MED ORDER — AMILORIDE-HYDROCHLOROTHIAZIDE 5-50 MG PO TABS: 0.5000 | ORAL_TABLET | Freq: Every day | ORAL | 2 refills | Status: DC | PRN

## 2017-06-29 NOTE — Progress Notes (Signed)
Subjective:   Jessica Wheeler is a 77 y.o. female who presents for Medicare Annual (Subsequent) preventive examination.  Review of Systems:  N/A Cardiac Risk Factors include: advanced age (>2455men, 5>65 women);diabetes mellitus;dyslipidemia;hypertension;obesity (BMI >30kg/m2)     Objective:     Vitals: BP (!) 160/72 (BP Location: Left Arm)   Pulse 88   Temp (!) 97.4 F (36.3 C) (Oral)   Ht 5\' 3"  (1.6 m)   Wt 145 lb 3.2 oz (65.9 kg)   BMI 25.72 kg/m   Body mass index is 25.72 kg/m.  Advanced Directives 06/29/2017 04/04/2017 10/12/2015 08/31/2015 03/04/2015 02/13/2015 11/04/2014  Does Patient Have a Medical Advance Directive? Yes Yes No Yes Yes Yes Yes  Type of Estate agentAdvance Directive Healthcare Power of [place];Living will - - Healthcare Power of [place];Living will Living will Living will Healthcare Power of [place];Living will  Copy of Healthcare Power of Attorney in Chart? No - copy requested - - No - copy requested - - -  Pre-existing out of facility DNR order (yellow form or pink MOST form) - Physician notified to receive inpatient order;Pink MOST form placed in chart (order not valid for inpatient use) - - - - -    Tobacco Social History   Tobacco Use  Smoking Status Former Smoker  . Packs/day: 1.00  . Years: 15.00  . Pack years: 15.00  . Types: Cigarettes  . Last attempt to quit: 08/03/2003  . Years since quitting: 13.9  Smokeless Tobacco Never Used     Counseling given: Not Answered   Clinical Intake:  Pre-visit preparation completed: Yes  Pain : No/denies pain Pain Score: 0-No pain     Nutritional Status: BMI 25 -29 Overweight Nutritional Risks: None Diabetes: Yes(type 2) CBG done?: No Did pt. bring in CBG monitor from home?: No  How often do you need to have someone help you when you read instructions, pamphlets, or other written materials from your doctor or pharmacy?: 1 - Never  Interpreter Needed?: No  Information entered by :: [facility], LPN  Past  Medical History:  Diagnosis Date  . Allergy   . Arthritis   . Bundle branch block, right   . Diabetes mellitus   . Diffuse cystic mastopathy   . Dissection, aorta (HCC)    Dr.Dew follows  . Diverticulosis   . Family history of malignant neoplasm of gastrointestinal tract   . Glaucoma 2005  . Heart murmur   . History of bronchitis   . History of pancreatitis 2005  . Hypertension 1982  . Personal history of tobacco use, presenting hazards to health   . RBBB   . Ulcer    Past Surgical History:  Procedure Laterality Date  . ABDOMINAL HYSTERECTOMY  1991  . APPENDECTOMY    . BACK SURGERY  1982   for lumbar ruptured disc. No hardware, no discectomy, no fusion  . BLADDER SURGERY    . BREAST BIOPSY Right 1980's  . BREAST SURGERY     biopsy  . CARDIAC CATHETERIZATION     Dr. Lady GaryFath, few years ago  . CATARACT EXTRACTION W/PHACO Left 08/31/2015   Procedure: CATARACT EXTRACTION PHACO AND INTRAOCULAR LENS PLACEMENT (IOC);  Surgeon: Sherald HessAnita Prakash Vin-Parikh, MD;  [facility];  Service: Ophthalmology;  Laterality: Left;  DIABETIC - oral meds  . CATARACT EXTRACTION W/PHACO Right 10/12/2015   Procedure: CATARACT EXTRACTION PHACO AND INTRAOCULAR LENS PLACEMENT (IOC) right eye;  Surgeon: Sherald HessAnita Prakash Vin-Parikh, MD;  [facility];  Service: Ophthalmology;  Laterality: Right;  DIABETIC - oral meds  . CHOLECYSTECTOMY  1994  . COLONOSCOPY  2008,02/22/2012   Dr. Sankar-2013  . COLONOSCOPY WITH PROPOFOL N/A 04/04/2017   Procedure: COLONOSCOPY WITH PROPOFOL;  Surgeon: Jessica Brightly, MD;  Location: [facility];  Service: Endoscopy;  Laterality: N/A;   Family History  Problem Relation Age of Onset  . Diabetes Mother   . Hypertension Mother   . Anxiety disorder Mother   . Heart disease Father        rheumatic heart disease  . Heart attack Father   . Rheum arthritis Father   . Colon cancer Brother   . Hypertension Maternal Grandmother   . Hypertension  Maternal Grandfather   . Breast cancer Maternal Aunt 60  . Breast cancer Cousin 1   Social History   Socioeconomic History  . Marital status: Widowed    Spouse name: None  . Number of children: 2  . Years of education: 82  . Highest education level: Associate degree: academic program  Social Needs  . Financial resource strain: Not hard at all  . Food insecurity - worry: Never true  . Food insecurity - inability: Never true  . Transportation needs - medical: No  . Transportation needs - non-medical: No  Occupational History  . Occupation: Retired  Tobacco Use  . Smoking status: Former Smoker    Packs/day: 1.00    Years: 15.00    Pack years: 15.00    Types: Cigarettes    Last attempt to quit: 08/03/2003    Years since quitting: 13.9  . Smokeless tobacco: Never Used  Substance and Sexual Activity  . Alcohol use: No  . Drug use: No  . Sexual activity: None  Other Topics Concern  . None  Social History Narrative  . None    Outpatient Encounter Medications as of 06/29/2017  Medication Sig  . amiloride-hydrochlorothiazide (MODURETIC) 5-50 MG tablet Take 1 tablet by mouth daily. (Patient taking differently: Take 1 tablet by mouth daily. )  . amLODipine (NORVASC) 5 MG tablet TAKE 1 TABLET BY MOUTH EVERY DAY  . aspirin 81 MG EC tablet Take 81 mg by mouth daily.    . Ibuprofen (ADVIL) 200 MG CAPS Take 1 capsule by mouth daily.    Marland Kitchen latanoprost (XALATAN) 0.005 % ophthalmic solution Reported on 10/12/2015 - both eyes nightly  . losartan (COZAAR) 100 MG tablet Take 1 tablet (100 mg total) by mouth daily.  . metFORMIN (GLUCOPHAGE) 500 MG tablet TAKE 2 TABLETS(1000 MG) BY MOUTH TWICE DAILY  . vitamin B-12 (CYANOCOBALAMIN) 1000 MCG tablet Take 1,000 mcg by mouth daily.  . clotrimazole (LOTRIMIN) 1 % cream Apply 1 application topically daily.   Marland Kitchen triamcinolone cream (KENALOG) 0.1 % daily.  . [DISCONTINUED] polyethylene glycol powder (GLYCOLAX/MIRALAX) powder 255 grams one bottle for  colonoscopy prep (Patient not taking: Reported on 05/11/2017)   No facility-administered encounter medications on file as of 06/29/2017.     Activities of Daily Living In your present state of health, do you have any difficulty performing the following activities: 06/29/2017 05/11/2017  Hearing? Y Y  Comment Due to fluid in ears. -  Vision? N N  Difficulty concentrating or making decisions? N Y  Walking or climbing stairs? N N  Dressing or bathing? N N  Doing errands, shopping? N N  Preparing Food and eating ? N -  Using the Toilet? N -  In the past six months, have you accidently leaked urine? N -  Do you  have problems with loss of bowel control? N -  Managing your Medications? N -  Managing your Finances? N -  Housekeeping or managing your Housekeeping? N -  Some recent data might be hidden    Patient Care Team: Erasmo Downer, MD as PCP - General (Family Medicine) Mariah Milling Tollie Pizza, MD as Consulting Physician (Cardiology) Wyn Quaker Marlow Baars, MD as Referring Physician (Vascular Surgery) Debbrah Alar, MD as Consulting Physician (Dermatology)    Assessment:   This is a routine wellness examination for [place].  Exercise Activities and Dietary recommendations Current Exercise Habits: Home exercise routine, Type of exercise: walking, Time (Minutes): 40, Frequency (Times/Week): 5, Weekly Exercise (Minutes/Week): 200, Intensity: Mild, Exercise limited by: None identified  Goals    . DIET - INCREASE WATER INTAKE     Recommend increasing water intake to 6-8 glasses a day.        Fall Risk Fall Risk  06/29/2017 05/11/2017 11/04/2014  Falls in the past year? No No No   Is the patient's home free of loose throw rugs in walkways, pet beds, electrical cords, etc?   yes      Grab bars in the bathroom? no      Handrails on the stairs?   yes      Adequate lighting?   yes  Timed Get Up and Go performed: N/A  Depression Screen PHQ 2/9 Scores 06/29/2017 05/11/2017 12/28/2015 11/04/2014    PHQ - 2 Score 0 2 2 1   PHQ- 9 Score 3 6 13  -     Cognitive Function: Pt declined screening today.         Immunization History  Administered Date(s) Administered  . Influenza-Unspecified 01/28/2016, 01/30/2017  . Pneumococcal Conjugate-13 09/10/2013, 02/26/2014  . Pneumococcal Polysaccharide-23 08/23/2010  . Zoster 06/14/2011    Qualifies for Shingles Vaccine? Due for Shingles vaccine. Declined my offer to administer today. Education has been provided regarding the importance of this vaccine. Pt has been advised to call her insurance company to determine her out of pocket expense. Advised she may also receive this vaccine at her local pharmacy or Health Dept. Verbalized acceptance and understanding.  Screening Tests Health Maintenance  Topic Date Due  . TETANUS/TDAP  02/08/1960  . HEMOGLOBIN A1C  09/05/2017  . FOOT EXAM  05/11/2018  . OPHTHALMOLOGY EXAM  05/26/2018  . INFLUENZA VACCINE  Completed  . DEXA SCAN  Completed  . PNA vac Low Risk Adult  Completed    Cancer Screenings: Lung: Low Dose CT Chest recommended if Age 33-80 years, 30 pack-year currently smoking OR have quit w/in 15years. Patient does qualify. Note sent to set up procedure. Breast:  Up to date on Mammogram? Yes   Up to date of Bone Density/Dexa? Yes Colorectal: Up to date  Additional Screenings:  Hepatitis C Screening: N/A     Plan:  I have personally reviewed and addressed the Medicare Annual Wellness questionnaire and have noted the following in the patient's chart:  A. Medical and social history B. Use of alcohol, tobacco or illicit drugs  C. Current medications and supplements D. Functional ability and status E.  Nutritional status F.  Physical activity G. Advance directives H. List of other physicians I.  Hospitalizations, surgeries, and ER visits in previous 12 months J.  Vitals K. Screenings such as hearing and vision if needed, cognitive and depression L. Referrals and appointments -  none  In addition, I have reviewed and discussed with patient certain preventive protocols, quality metrics, and best  practice recommendations. A written personalized care plan for preventive services as well as general preventive health recommendations were provided to patient.  See attached scanned questionnaire for additional information.   Signed,  Hyacinth Meeker, LPN Nurse Health Advisor   Nurse Recommendations: Pt declined the tetanus vaccine today. Referral sent for CT due to smoking history.

## 2017-06-29 NOTE — Assessment & Plan Note (Signed)
Patient reports it is well controlled at home and she is currently stressed Above goal today Will continue current meds and continue to monitor F/u in 3 months as already scheduled

## 2017-06-29 NOTE — Assessment & Plan Note (Signed)
Can use diuretic prn, but sparingly in setting of chronic hyponatremia Unable to tolerate loop diuretic due to severe reaction to sulfonamides

## 2017-06-29 NOTE — Assessment & Plan Note (Signed)
Likely related to diuretic as below Repeat today

## 2017-06-29 NOTE — Patient Instructions (Addendum)
Jessica Wheeler , Thank you for Jessica Livtaking time to come for your Medicare Wellness Visit. I appreciate your ongoing commitment to your health goals. Please review the following plan we discussed and let me know if I can assist you in the future.   Screening recommendations/referrals: Colonoscopy: Up to date Mammogram: Up to date Bone Density: Up to date Recommended yearly ophthalmology/optometry visit for glaucoma screening and checkup Recommended yearly dental visit for hygiene and checkup  Vaccinations: Influenza vaccine: Up to date Pneumococcal vaccine: Up to date Tdap vaccine: Pt declines today.  Shingles vaccine: Pt declines today.    Advanced directives: Please bring a copy of your POA (Power of Attorney) and/or Living Will to your next appointment.   Conditions/risks identified: Recommend increasing water intake to 6-8 glasses a day.   Next appointment: 3:00 PM today with Dr B.   Preventive Care 4565 Years and Older, Female Preventive care refers to lifestyle choices and visits with your health care provider that can promote health and wellness. What does preventive care include?  A yearly physical exam. This is also called an annual well check.  Dental exams once or twice a year.  Routine eye exams. Ask your health care provider how often you should have your eyes checked.  Personal lifestyle choices, including:  Daily care of your teeth and gums.  Regular physical activity.  Eating a healthy diet.  Avoiding tobacco and drug use.  Limiting alcohol use.  Practicing safe sex.  Taking low-dose aspirin every day.  Taking vitamin and mineral supplements as recommended by your health care provider. What happens during an annual well check? The services and screenings done by your health care provider during your annual well check will depend on your age, overall health, lifestyle risk factors, and family history of disease. Counseling  Your health care provider may ask you  questions about your:  Alcohol use.  Tobacco use.  Drug use.  Emotional well-being.  Home and relationship well-being.  Sexual activity.  Eating habits.  History of falls.  Memory and ability to understand (cognition).  Work and work Astronomerenvironment.  Reproductive health. Screening  You may have the following tests or measurements:  Height, weight, and BMI.  Blood pressure.  Lipid and cholesterol levels. These may be checked every 5 years, or more frequently if you are over 79 years old.  Skin check.  Lung cancer screening. You may have this screening every year starting at age 79 if you have a 30-pack-year history of smoking and currently smoke or have quit within the past 15 years.  Fecal occult blood test (FOBT) of the stool. You may have this test every year starting at age 79.  Flexible sigmoidoscopy or colonoscopy. You may have a sigmoidoscopy every 5 years or a colonoscopy every 10 years starting at age 750.  Hepatitis C blood test.  Hepatitis B blood test.  Sexually transmitted disease (STD) testing.  Diabetes screening. This is done by checking your blood sugar (glucose) after you have not eaten for a while (fasting). You may have this done every 1-3 years.  Bone density scan. This is done to screen for osteoporosis. You may have this done starting at age 79.  Mammogram. This may be done every 1-2 years. Talk to your health care provider about how often you should have regular mammograms. Talk with your health care provider about your test results, treatment options, and if necessary, the need for more tests. Vaccines  Your health care provider may recommend certain  vaccines, such as:  Influenza vaccine. This is recommended every year.  Tetanus, diphtheria, and acellular pertussis (Tdap, Td) vaccine. You may need a Td booster every 10 years.  Zoster vaccine. You may need this after age 3.  Pneumococcal 13-valent conjugate (PCV13) vaccine. One dose is  recommended after age 55.  Pneumococcal polysaccharide (PPSV23) vaccine. One dose is recommended after age 23. Talk to your health care provider about which screenings and vaccines you need and how often you need them. This information is not intended to replace advice given to you by your health care provider. Make sure you discuss any questions you have with your health care provider. Document Released: 05/15/2015 Document Revised: 01/06/2016 Document Reviewed: 02/17/2015 Elsevier Interactive Patient Education  2017 [place] Prevention in the Home Falls can cause injuries. They can happen to people of all ages. There are many things you can do to make your home safe and to help prevent falls. What can I do on the outside of my home?  Regularly fix the edges of walkways and driveways and fix any cracks.  Remove anything that might make you trip as you walk through a door, such as a raised step or threshold.  Trim any bushes or trees on the path to your home.  Use bright outdoor lighting.  Clear any walking paths of anything that might make someone trip, such as rocks or tools.  Regularly check to see if handrails are loose or broken. Make sure that both sides of any steps have handrails.  Any raised decks and porches should have guardrails on the edges.  Have any leaves, snow, or ice cleared regularly.  Use sand or salt on walking paths during winter.  Clean up any spills in your garage right away. This includes oil or grease spills. What can I do in the bathroom?  Use night lights.  Install grab bars by the toilet and in the tub and shower. Do not use towel bars as grab bars.  Use non-skid mats or decals in the tub or shower.  If you need to sit down in the shower, use a plastic, non-slip stool.  Keep the floor dry. Clean up any water that spills on the floor as soon as it happens.  Remove soap buildup in the tub or shower regularly.  Attach bath mats  securely with double-sided non-slip rug tape.  Do not have throw rugs and other things on the floor that can make you trip. What can I do in the bedroom?  Use night lights.  Make sure that you have a light by your bed that is easy to reach.  Do not use any sheets or blankets that are too big for your bed. They should not hang down onto the floor.  Have a firm chair that has side arms. You can use this for support while you get dressed.  Do not have throw rugs and other things on the floor that can make you trip. What can I do in the kitchen?  Clean up any spills right away.  Avoid walking on wet floors.  Keep items that you use a lot in easy-to-reach places.  If you need to reach something above you, use a strong step stool that has a grab bar.  Keep electrical cords out of the way.  Do not use floor polish or wax that makes floors slippery. If you must use wax, use non-skid floor wax.  Do not have throw rugs and other things  on the floor that can make you trip. What can I do with my stairs?  Do not leave any items on the stairs.  Make sure that there are handrails on both sides of the stairs and use them. Fix handrails that are broken or loose. Make sure that handrails are as long as the stairways.  Check any carpeting to make sure that it is firmly attached to the stairs. Fix any carpet that is loose or worn.  Avoid having throw rugs at the top or bottom of the stairs. If you do have throw rugs, attach them to the floor with carpet tape.  Make sure that you have a light switch at the top of the stairs and the bottom of the stairs. If you do not have them, ask someone to add them for you. What else can I do to help prevent falls?  Wear shoes that:  Do not have high heels.  Have rubber bottoms.  Are comfortable and fit you well.  Are closed at the toe. Do not wear sandals.  If you use a stepladder:  Make sure that it is fully opened. Do not climb a closed  stepladder.  Make sure that both sides of the stepladder are locked into place.  Ask someone to hold it for you, if possible.  Clearly mark and make sure that you can see:  Any grab bars or handrails.  First and last steps.  Where the edge of each step is.  Use tools that help you move around (mobility aids) if they are needed. These include:  Canes.  Walkers.  Scooters.  Crutches.  Turn on the lights when you go into a dark area. Replace any light bulbs as soon as they burn out.  Set up your furniture so you have a clear path. Avoid moving your furniture around.  If any of your floors are uneven, fix them.  If there are any pets around you, be aware of where they are.  Review your medicines with your doctor. Some medicines can make you feel dizzy. This can increase your chance of falling. Ask your doctor what other things that you can do to help prevent falls. This information is not intended to replace advice given to you by your health care provider. Make sure you discuss any questions you have with your health care provider. Document Released: 02/12/2009 Document Revised: 09/24/2015 Document Reviewed: 05/23/2014 Elsevier Interactive Patient Education  2017 [employer].

## 2017-06-29 NOTE — Patient Instructions (Signed)
Preventive Care 79 Years and Older, Female Preventive care refers to lifestyle choices and visits with your health care provider that can promote health and wellness. What does preventive care include?  A yearly physical exam. This is also called an annual well check.  Dental exams once or twice a year.  Routine eye exams. Ask your health care provider how often you should have your eyes checked.  Personal lifestyle choices, including: ? Daily care of your teeth and gums. ? Regular physical activity. ? Eating a healthy diet. ? Avoiding tobacco and drug use. ? Limiting alcohol use. ? Practicing safe sex. ? Taking low-dose aspirin every day. ? Taking vitamin and mineral supplements as recommended by your health care provider. What happens during an annual well check? The services and screenings done by your health care provider during your annual well check will depend on your age, overall health, lifestyle risk factors, and family history of disease. Counseling Your health care provider may ask you questions about your:  Alcohol use.  Tobacco use.  Drug use.  Emotional well-being.  Home and relationship well-being.  Sexual activity.  Eating habits.  History of falls.  Memory and ability to understand (cognition).  Work and work environment.  Reproductive health.  Screening You may have the following tests or measurements:  Height, weight, and BMI.  Blood pressure.  Lipid and cholesterol levels. These may be checked every 5 years, or more frequently if you are over 50 years old.  Skin check.  Lung cancer screening. You may have this screening every year starting at age 55 if you have a 30-pack-year history of smoking and currently smoke or have quit within the past 15 years.  Fecal occult blood test (FOBT) of the stool. You may have this test every year starting at age 50.  Flexible sigmoidoscopy or colonoscopy. You may have a sigmoidoscopy every 5 years or  a colonoscopy every 10 years starting at age 50.  Hepatitis C blood test.  Hepatitis B blood test.  Sexually transmitted disease (STD) testing.  Diabetes screening. This is done by checking your blood sugar (glucose) after you have not eaten for a while (fasting). You may have this done every 1-3 years.  Bone density scan. This is done to screen for osteoporosis. You may have this done starting at age 79.  Mammogram. This may be done every 1-2 years. Talk to your health care provider about how often you should have regular mammograms.  Talk with your health care provider about your test results, treatment options, and if necessary, the need for more tests. Vaccines Your health care provider may recommend certain vaccines, such as:  Influenza vaccine. This is recommended every year.  Tetanus, diphtheria, and acellular pertussis (Tdap, Td) vaccine. You may need a Td booster every 10 years.  Varicella vaccine. You may need this if you have not been vaccinated.  Zoster vaccine. You may need this after age 60.  Measles, mumps, and rubella (MMR) vaccine. You may need at least one dose of MMR if you were born in 1957 or later. You may also need a second dose.  Pneumococcal 13-valent conjugate (PCV13) vaccine. One dose is recommended after age 79.  Pneumococcal polysaccharide (PPSV23) vaccine. One dose is recommended after age 79.  Meningococcal vaccine. You may need this if you have certain conditions.  Hepatitis A vaccine. You may need this if you have certain conditions or if you travel or work in places where you may be exposed to hepatitis   A.  Hepatitis B vaccine. You may need this if you have certain conditions or if you travel or work in places where you may be exposed to hepatitis B.  Haemophilus influenzae type b (Hib) vaccine. You may need this if you have certain conditions.  Talk to your health care provider about which screenings and vaccines you need and how often you  need them. This information is not intended to replace advice given to you by your health care provider. Make sure you discuss any questions you have with your health care provider. Document Released: 05/15/2015 Document Revised: 01/06/2016 Document Reviewed: 02/17/2015 Elsevier Interactive Patient Education  2018 [employer]  

## 2017-06-29 NOTE — Progress Notes (Signed)
Patient: Jessica Wheeler, Female    DOB: 1940/08/10, 77 y.o.   MRN: [id number] Visit Date: 06/29/2017  Today's Provider: Shirlee Latch, MD   Chief Complaint  Patient presents with  . Annual Exam   Subjective:   Pt presents for a follow up from her AWV she had with the [facility].  Complete Physical Jessica Wheeler is a 77 y.o. female. She feels well. She reports exercising daily. Walks her dog. She states the rainy weather is exacerbating her arthritis, and is walking less. She reports she is sleeping fairly well.  Last colonoscopy- 04/04/2017- diverticulosis. Otherwise WNL. Repeat 5 years due to family history. Last- 04/26/2017- BI-RADS 1 -----------------------------------------------------------   Review of Systems  Constitutional: Negative.   HENT: Positive for congestion, ear discharge, rhinorrhea and sinus pressure. Negative for dental problem, drooling, ear pain, facial swelling, hearing loss, mouth sores, nosebleeds, postnasal drip, sinus pain, sneezing, sore throat, tinnitus, trouble swallowing and voice change.   Eyes: Negative.   Respiratory: Negative.   Cardiovascular: Negative.   Gastrointestinal: Negative.   Endocrine: Negative.   Genitourinary: Negative.   Musculoskeletal: Positive for back pain. Negative for arthralgias, gait problem, joint swelling, myalgias, neck pain and neck stiffness.  Skin: Positive for rash. Negative for color change, pallor and wound.  Allergic/Immunologic: Negative.   Neurological: Positive for light-headedness. Negative for dizziness, tremors, seizures, syncope, facial asymmetry, speech difficulty, weakness, numbness and headaches.  Hematological: Negative.   Psychiatric/Behavioral: Negative.     Social History   Socioeconomic History  . Marital status: Widowed    Spouse name: Not on file  . Number of children: 2  . Years of education: 61  . Highest education level: Associate degree: academic program  Social Needs  .  Financial resource strain: Not hard at all  . Food insecurity - worry: Never true  . Food insecurity - inability: Never true  . Transportation needs - medical: No  . Transportation needs - non-medical: No  Occupational History  . Occupation: Retired  Tobacco Use  . Smoking status: Former Smoker    Packs/day: 1.00    Years: 15.00    Pack years: 15.00    Types: Cigarettes    Last attempt to quit: 08/03/2003    Years since quitting: 13.9  . Smokeless tobacco: Never Used  Substance and Sexual Activity  . Alcohol use: No  . Drug use: No  . Sexual activity: Not on file  Other Topics Concern  . Not on file  Social History Narrative  . Not on file    Past Medical History:  Diagnosis Date  . Allergy   . Arthritis   . Bundle branch block, right   . Diabetes mellitus   . Diffuse cystic mastopathy   . Dissection, aorta (HCC)    Dr.Dew follows  . Diverticulosis   . Family history of malignant neoplasm of gastrointestinal tract   . Glaucoma 2005  . Heart murmur   . History of bronchitis   . History of pancreatitis 2005  . Hypertension 1982  . Personal history of tobacco use, presenting hazards to health   . RBBB   . Ulcer      Patient Active Problem List   Diagnosis Date Noted  . Lower extremity edema 06/29/2017  . Primary osteoarthritis of left knee 03/10/2016  . Depression 12/28/2015  . Allergic rhinitis 01/31/2015  . Benign paroxysmal positional nystagmus 01/31/2015  . Bladder infection, chronic 01/31/2015  . Glaucoma 01/31/2015  . Alopecia  01/31/2015  . Chronic hyponatremia 01/31/2015  . Cramp in muscle 01/31/2015  . 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 Anxiety 11/04/2014  . Muscle weakness 06/24/2014  . Fibrocystic breast disease 04/30/2013  . 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 Renal artery stenosis (HCC) 04/18/2012  . Murmur 10/10/2011  . 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HCC) 03/31/2011  . HTN (hypertension) 08/03/2010  . CAD (coronary artery  disease) 08/03/2010  . DM (diabetes mellitus) (HCC) 08/03/2010  . Hyperlipidemia 08/03/2010    Past Surgical History:  Procedure Laterality Date  . ABDOMINAL HYSTERECTOMY  1991  . APPENDECTOMY    . BACK SURGERY  1982   for lumbar ruptured disc. No hardware, no discectomy, no fusion  . BLADDER SURGERY    . BREAST BIOPSY Right 1980's  . BREAST SURGERY     biopsy  . CARDIAC CATHETERIZATION     Dr. Lady Gary, few years ago  . CATARACT EXTRACTION W/PHACO Left 08/31/2015   Procedure: CATARACT EXTRACTION PHACO AND INTRAOCULAR LENS PLACEMENT (IOC);  Surgeon: Sherald Hess, MD;  Location: [facility];  Service: Ophthalmology;  Laterality: Left;  DIABETIC - oral meds  . CATARACT EXTRACTION W/PHACO Right 10/12/2015   Procedure: CATARACT EXTRACTION PHACO AND INTRAOCULAR LENS PLACEMENT (IOC) right eye;  Surgeon: Sherald Hess, MD;  Location: [facility];  Service: Ophthalmology;  Laterality: Right;  DIABETIC - oral meds  . CHOLECYSTECTOMY  1994  . COLONOSCOPY  2008,02/22/2012   Dr. Sankar-2013  . COLONOSCOPY WITH PROPOFOL N/A 04/04/2017   Procedure: COLONOSCOPY WITH PROPOFOL;  Surgeon: [name], MD;  Location: [facility];  Service: Endoscopy;  Laterality: N/A;    Her family history includes Anxiety disorder in her mother; Breast cancer (age of onset: 70) in her cousin; Breast cancer (age of onset: 77) in her maternal aunt; Colon cancer in her brother; Diabetes in her mother; Heart attack in her father; Heart disease in her father; Hypertension in her maternal grandfather, maternal grandmother, and mother; Rheum arthritis in her father.      Current Outpatient Medications:  .  amiloride-hydrochlorothiazide (MODURETIC) 5-50 MG tablet, Take 0.5 tablets by mouth daily as needed (edema)., Disp: 10 tablet, Rfl: 2 .  amLODipine (NORVASC) 5 MG tablet, TAKE 1 TABLET BY MOUTH EVERY DAY, Disp: 90 tablet, Rfl: 3 .  aspirin 81 MG EC tablet, Take 81 mg by mouth  daily.  , Disp: , Rfl:  .  clotrimazole (LOTRIMIN) 1 % cream, Apply 1 application topically daily. , Disp: , Rfl:  .  Ibuprofen (ADVIL) 200 MG CAPS, Take 1 capsule by mouth daily.  , Disp: , Rfl:  .  latanoprost (XALATAN) 0.005 % ophthalmic solution, Reported on 10/12/2015 - both eyes nightly, Disp: , Rfl: 3 .  losartan (COZAAR) 100 MG tablet, Take 1 tablet (100 mg total) by mouth daily., Disp: 90 tablet, Rfl: 0 .  metFORMIN (GLUCOPHAGE) 500 MG tablet, TAKE 2 TABLETS(1000 MG) BY MOUTH TWICE DAILY, Disp: 360 tablet, Rfl: 0 .  triamcinolone cream (KENALOG) 0.1 %, daily., Disp: , Rfl: 1 .  vitamin B-12 (CYANOCOBALAMIN) 1000 MCG tablet, Take 1,000 mcg by mouth daily., Disp: , Rfl:   Patient Care Team: Erasmo Downer, MD as PCP - General (Family Medicine) Antonieta Iba, MD as Consulting Physician (Cardiology) Wyn Quaker Marlow Baars, MD as Referring Physician (Vascular Surgery) Debbrah Alar, MD as Consulting Physician (Dermatology)     Objective:   Vitals: BP 128/60 (BP Location: Left Arm, Cuff Size: Normal)   Pulse 88  Temp (!) 97.4 F (36.3 C) (Oral)   Resp 16   Ht 5\' 3"  (1.6 m)   Wt 145 lb (65.8 kg)   BMI 25.69 kg/m   Physical Exam  Constitutional: She is oriented to person, place, and time. She appears well-developed and well-nourished. No distress.  HENT:  Head: Normocephalic and atraumatic.  Right Ear: External ear normal.  Left Ear: External ear normal.  Nose: Nose normal.  Mouth/Throat: Oropharynx is clear and moist.  Eyes: Conjunctivae and EOM are normal. Pupils are equal, round, and reactive to light. No scleral icterus.  Neck: Neck supple. No thyromegaly present.  Cardiovascular: Normal rate, regular rhythm, normal heart sounds and intact distal pulses.  No murmur heard. Pulmonary/Chest: Effort normal and breath sounds normal. No respiratory distress. She has no wheezes. She has no rales.  Abdominal: Soft. Bowel sounds are normal. She exhibits no distension. There  is no tenderness. There is no rebound and no guarding.  Musculoskeletal: She exhibits no edema or deformity.  Lymphadenopathy:    She has no cervical adenopathy.  Neurological: She is alert and oriented to person, place, and time.  Skin: Skin is warm and dry. No rash noted.  Psychiatric: She has a normal mood and affect. Her behavior is normal.  Vitals reviewed.   Activities of Daily Living In your present state of health, do you have any difficulty performing the following activities: 06/29/2017 05/11/2017  Hearing? Y Y  Comment Due to fluid in ears. -  Vision? N N  Difficulty concentrating or making decisions? N Y  Walking or climbing stairs? N N  Dressing or bathing? N N  Doing errands, shopping? N N  Preparing Food and eating ? N -  Using the Toilet? N -  In the past six months, have you accidently leaked urine? N -  Do you have problems with loss of bowel control? N -  Managing your Medications? N -  Managing your Finances? N -  Housekeeping or managing your Housekeeping? N -  Some recent data might be hidden    Fall Risk Assessment Fall Risk  06/29/2017 05/11/2017 11/04/2014  Falls in the past year? No No No     Depression Screen PHQ 2/9 Scores 06/29/2017 05/11/2017 12/28/2015 11/04/2014  PHQ - 2 Score 0 2 2 1   PHQ- 9 Score 3 6 13  -    Pt refused 6CIT screening today.   Assessment & Plan:    Annual Physical Reviewed patient's Family Medical History Reviewed and updated list of patient's medical providers Assessment of cognitive impairment was done Assessed patient's functional ability Established a written schedule for health screening services Health Risk Assessent Completed and Reviewed  Exercise Activities and Dietary recommendations Goals    . DIET - INCREASE WATER INTAKE     Recommend increasing water intake to 6-8 glasses a day.        Immunization History  Administered Date(s) Administered  . Influenza-Unspecified 01/28/2016, 01/30/2017  .  Pneumococcal Conjugate-13 09/10/2013, 02/26/2014  . Pneumococcal Polysaccharide-23 08/23/2010  . Zoster 06/14/2011    Health Maintenance  Topic Date Due  . TETANUS/TDAP  02/08/1958  . HEMOGLOBIN A1C  09/05/2017  . FOOT EXAM  05/11/2018  . OPHTHALMOLOGY EXAM  05/26/2018  . INFLUENZA VACCINE  Completed  . DEXA SCAN  Completed  . PNA vac Low Risk Adult  Completed    Patient to consider Shingrix.  She is not interested at this time due to reaction to Zostavax (has had shingles 4 times, headaches,  joint pains)  Discussed health benefits of physical activity, and encouraged her to engage in regular exercise appropriate for her age and condition.    ------------------------------------------------------------------------------------------------------------ Problem List Items Addressed This Visit      Cardiovascular and Mediastinum   HTN (hypertension)    Patient reports it is well controlled at home and she is currently stressed Above goal today Will continue current meds and continue to monitor F/u in 3 months as already scheduled      Relevant Medications   amiloride-hydrochlorothiazide (MODURETIC) 5-50 MG tablet     Endocrine   DM (diabetes mellitus) (HCC)   Relevant Orders   Hemoglobin A1c     Other   Chronic hyponatremia    Likely related to diuretic as below Repeat today      Relevant Orders   Sodium   Lower extremity edema    Can use diuretic prn, but sparingly in setting of chronic hyponatremia Unable to tolerate loop diuretic due to severe reaction to sulfonamides       Other Visit Diagnoses    Encounter for annual physical exam    -  Primary       Return in about 3 months (around 09/26/2017).   The entirety of the information documented in the History of Present Illness, Review of Systems and Physical Exam were personally obtained by me. Portions of this information were initially documented by Irving BurtonEmily Ratchford, CMA and reviewed by me for thoroughness  and accuracy.    Erasmo DownerBacigalupo, Kahlil Cowans M, MD, MPH [facility] 06/29/2017 4:33 PM

## 2017-06-30 ENCOUNTER — Telehealth: Payer: Self-pay | Admitting: Emergency Medicine

## 2017-06-30 LAB — SODIUM: Sodium: 130 mmol/L — ABNORMAL LOW (ref 134–144)

## 2017-06-30 LAB — HEMOGLOBIN A1C
Est. average glucose Bld gHb Est-mCnc: 148 mg/dL
Hgb A1c MFr Bld: 6.8 % — ABNORMAL HIGH (ref 4.8–5.6)

## 2017-06-30 NOTE — Telephone Encounter (Signed)
LMTCB

## 2017-06-30 NOTE — Telephone Encounter (Signed)
Pt returned call about lab results and request call back. Thanks TNP

## 2017-06-30 NOTE — Telephone Encounter (Signed)
-----   Message from Erasmo DownerAngela M Bacigalupo, MD sent at 06/30/2017  8:14 AM EST ----- A1c is good at 6.8.  Sodium is stable at 130  Bacigalupo, Marzella SchleinAngela M, MD, MPH [facility] 06/30/2017 8:14 AM

## 2017-06-30 NOTE — Telephone Encounter (Signed)
Pt advised.

## 2017-07-01 ENCOUNTER — Other Ambulatory Visit: Payer: Self-pay | Admitting: Family Medicine

## 2017-07-09 ENCOUNTER — Other Ambulatory Visit: Payer: Self-pay | Admitting: Family Medicine

## 2017-07-09 DIAGNOSIS — I10 Essential (primary) hypertension: Secondary | ICD-10-CM

## 2017-08-09 ENCOUNTER — Telehealth: Payer: Self-pay | Admitting: Family Medicine

## 2017-08-09 NOTE — Telephone Encounter (Signed)
Faxed [facility] - Dr Aram BeechamJeffrey Sparks on 12.19.18

## 2017-08-15 ENCOUNTER — Other Ambulatory Visit: Payer: Self-pay | Admitting: Family Medicine

## 2017-08-15 DIAGNOSIS — I10 Essential (primary) hypertension: Secondary | ICD-10-CM

## 2017-08-15 MED ORDER — AMLODIPINE BESYLATE 5 MG PO TABS: 5.0000 mg | ORAL_TABLET | Freq: Every day | ORAL | 0 refills | Status: DC

## 2017-08-15 NOTE — Telephone Encounter (Signed)
Pt contacted office for refill request on the following medications:  amLODipine (NORVASC) 5 MG tablet   90 day supply  [facility]  Pt was getting medication from previous PCP but is requesting that Dr. B take over the medication. Pt is requesting Rx to be sent in asap because she is out of the medication. Pt was advised that Dr. B is out of the office this afternoon. Please advise. Thanks TNP

## 2017-08-15 NOTE — Telephone Encounter (Signed)
Has FU for HTN on 09/08/2017. Sent in 90 day prescription with no refill for pt.

## 2017-08-21 DIAGNOSIS — D692 Other nonthrombocytopenic purpura: Secondary | ICD-10-CM | POA: Diagnosis not present

## 2017-09-08 ENCOUNTER — Ambulatory Visit (INDEPENDENT_AMBULATORY_CARE_PROVIDER_SITE_OTHER): Payer: PPO | Admitting: Family Medicine

## 2017-09-08 ENCOUNTER — Encounter: Payer: Self-pay | Admitting: Family Medicine

## 2017-09-08 VITALS — BP 128/60 | HR 71 | Temp 97.6°F | Resp 16 | Wt 140.0 lb

## 2017-09-08 DIAGNOSIS — E871 Hypo-osmolality and hyponatremia: Secondary | ICD-10-CM | POA: Diagnosis not present

## 2017-09-08 DIAGNOSIS — I10 Essential (primary) hypertension: Secondary | ICD-10-CM

## 2017-09-08 DIAGNOSIS — L559 Sunburn, unspecified: Secondary | ICD-10-CM

## 2017-09-08 NOTE — Progress Notes (Signed)
Patient: Jessica Wheeler Female    DOB: Dec 28, 1940   77 y.o.   MRN: [id number] Visit Date: 09/08/2017  Darlina Guys Ratchford, CMA, am acting as scribe for Shirlee Latch, MD.  [phone] Provider: Shirlee Latch, MD   Chief Complaint  Patient presents with  . Hypertension  . Skin Problem   Subjective:    HPI      Hypertension, follow-up:  BP Readings from Last 3 Encounters:  09/08/17 128/60  06/29/17 128/60  06/29/17 (!) 160/72    She was last seen for hypertension 3 months ago.  BP at that visit was 128/60 on recheck; was originally 160/72. Management since that visit includes monitoring at home and continuing medications. She reports good compliance with treatment. She is not having side effects.  She is exercising. Tries to walk about 2 miles daily. She is adherent to low salt diet.   Outside blood pressures are not being checked. She is experiencing none.  Patient denies chest pain, chest pressure/discomfort, claudication, dyspnea, exertional chest pressure/discomfort, fatigue, irregular heart beat, lower extremity edema, near-syncope, orthopnea, palpitations and syncope.   Cardiovascular risk factors include advanced age (older than 49 for men, 42 for women), diabetes mellitus and hypertension.  Use of agents associated with hypertension: topical mineralocorticoids. (These have been discontinued.)   Weight trend: stable Wt Readings from Last 3 Encounters:  09/08/17 140 lb (63.5 kg)  06/29/17 145 lb (65.8 kg)  06/29/17 145 lb 3.2 oz (65.9 kg)    Current diet: in general, a "healthy" diet    Only taking diuretic ~1/4 pill per day.  When she gets muscle cramps, she holds it. ------------------------------------------------------------------------ Skin Problem Pt has been seeing dermatology for skin issues. About a year ago, the dermatologist advised pt to stop using all soaps, and to start using a steroid cream. Pt states she was not advised to D/C this  cream, and has been using this for an entire year. She states this caused open wounds on her skin. She is now experiencing a "burning" rash on her face and both sides of her neck. She recently saw a PA at the dermatologist office about 2 weeks ago, and the PA advised her to stop using the steroid cream, and use Vaseline, baby oil, coconut oil, etc to keep the skin moist. She was also advised to use Dial anti-bacterial soap.     Allergies  Allergen Reactions  . Beta Adrenergic Blockers     Chest pressure/difficulty breathing  . Levaquin [Levofloxacin In D5w]     Low blood sugar; does take this when she needs it. She just makes sure to control her BS to avoid hypoglycemia.  . Lisinopril Cough  . Prevnar [Pneumococcal 13-Val Conj Vacc] Rash    Joint Pain  . Sulfa Antibiotics Rash    "makes my skin raw"     Current Outpatient Medications:  .  amiloride-hydrochlorothiazide (MODURETIC) 5-50 MG tablet, TAKE 1/2 TABLET BY MOUTH DAILY AS NEEDED FOR SWELLING, Disp: 45 tablet, Rfl: 2 .  amLODipine (NORVASC) 5 MG tablet, Take 1 tablet (5 mg total) by mouth daily., Disp: 90 tablet, Rfl: 0 .  aspirin 81 MG EC tablet, Take 81 mg by mouth daily.  , Disp: , Rfl:  .  Ibuprofen (ADVIL) 200 MG CAPS, Take 1 capsule by mouth daily.  , Disp: , Rfl:  .  latanoprost (XALATAN) 0.005 % ophthalmic solution, Reported on 10/12/2015 - both eyes nightly, Disp: , Rfl: 3 .  losartan (COZAAR)  100 MG tablet, TAKE 1 TABLET BY MOUTH EVERY DAY, Disp: 90 tablet, Rfl: 0 .  metFORMIN (GLUCOPHAGE) 500 MG tablet, TAKE 2 TABLETS(1000 MG) BY MOUTH TWICE DAILY, Disp: 360 tablet, Rfl: 0 .  vitamin B-12 (CYANOCOBALAMIN) 1000 MCG tablet, Take 1,000 mcg by mouth daily., Disp: , Rfl:   Review of Systems  Constitutional: Negative for activity change, appetite change, chills, diaphoresis, fatigue, fever and unexpected weight change.  Respiratory: Negative for shortness of breath.   Cardiovascular: Negative for chest pain, palpitations and  leg swelling.  Skin: Positive for rash and wound.    Social History   Tobacco Use  . Smoking status: Former Smoker    Packs/day: 1.00    Years: 15.00    Pack years: 15.00    Types: Cigarettes    Last attempt to quit: 08/03/2003    Years since quitting: 14.1  . Smokeless tobacco: Never Used  Substance Use Topics  . Alcohol use: No   Objective:   BP 128/60 (BP Location: Left Arm, Patient Position: Sitting, Cuff Size: Large)   Pulse 71   Temp 97.6 F (36.4 C) (Oral)   Resp 16   Wt 140 lb (63.5 kg)   SpO2 99%   BMI 24.80 kg/m  Vitals:   09/08/17 0810  BP: 128/60  Pulse: 71  Resp: 16  Temp: 97.6 F (36.4 C)  TempSrc: Oral  SpO2: 99%  Weight: 140 lb (63.5 kg)     Physical Exam  Constitutional: She is oriented to person, place, and time. She appears well-developed and well-nourished. No distress.  HENT:  Head: Normocephalic and atraumatic.  Eyes: Conjunctivae are normal.  Neck: Neck supple. No thyromegaly present.  Cardiovascular: Normal rate, regular rhythm, normal heart sounds and intact distal pulses.  No murmur heard. Pulmonary/Chest: Effort normal and breath sounds normal. No respiratory distress. She has no wheezes. She has no rales.  Musculoskeletal: She exhibits no edema.  Lymphadenopathy:    She has no cervical adenopathy.  Neurological: She is alert and oriented to person, place, and time.  Skin: Skin is warm and dry. Capillary refill takes less than 2 seconds. There is erythema (of neck and face).  Psychiatric: She has a normal mood and affect. Her behavior is normal.  Vitals reviewed.      Assessment & Plan:   Problem List Items Addressed This Visit      Cardiovascular and Mediastinum   HTN (hypertension) - Primary    Well controlled Continue current meds Avoid diuretic most of the time due to chronic hyponatremia Check BMP F/u in 6 months      Relevant Orders   Basic Metabolic Panel (BMET)     Other   Chronic hyponatremia    Likely 2/2  diuretic use Avoid if possible Recheck BMP today      Relevant Orders   Basic Metabolic Panel (BMET)    Other Visit Diagnoses    Sunburn        - no rash present, but erythema and burning sensation seem related to sunburn - patient has been applying baby oil prior to going into the sun which likely causes sunburn - advised moisturizing at night and suncreen during the day - advised on aloe +/- lidocaine to help with sunburn   Return in about 3 months (around 12/09/2017) for DM f/u.   The entirety of the information documented in the History of Present Illness, Review of Systems and Physical Exam were personally obtained by me. Portions of this information  were initially documented by Joslyn Hy, CMA and reviewed by me for thoroughness and accuracy.    Erasmo Downer, MD, MPH [facility] 09/08/2017 8:48 AM

## 2017-09-08 NOTE — Assessment & Plan Note (Signed)
Likely 2/2 diuretic use Avoid if possible Recheck BMP today

## 2017-09-08 NOTE — Assessment & Plan Note (Signed)
Well controlled Continue current meds Avoid diuretic most of the time due to chronic hyponatremia Check BMP F/u in 6 months

## 2017-09-08 NOTE — Patient Instructions (Signed)
Sunburn Sunburn is damage to the skin that is caused by overexposure to ultraviolet (UV) rays. Repeated sun exposure causes early skin aging, such as wrinkles and sun spots. It also increases the risk of skin cancer.  CAUSES Sunburn is caused by getting too much UV radiation from the sun. RISK FACTORS The following factors may make you more likely to develop this condition:  Having a family history of sensitivity to the sun.  Having certain diseases, such as lupus.  Taking certain medicines.  Using certain cosmetics.  Having light-colored skin (light complexion). SYMPTOMS Symptoms of this condition include:  Red or pink skin.  Soreness and swelling of the affected areas.  Pain.  Blisters.  Peeling skin. You may also have a headache, fever, or fatigue if the sunburn covers a large part of your body. DIAGNOSIS This condition is diagnosed with a medical history and physical exam. TREATMENT Treatment focuses on managing your symptoms. Treatment may include:  Medicines to reduce swelling.  Steroid medicines to help with inflammation and itching. These may be applied as creams or taken by mouth (orally).  Antibiotic cream or ointment to apply to any blisters that break open. HOME CARE INSTRUCTIONS Medicines  Take or apply over-the-counter and prescription medicines only as told by your health care provider.  If you were prescribed an antibiotic medicine, use it as told by your health care provider. Do not stop using the antibiotic even if your condition improves. General Instructions  Avoid further exposure to the sun. Protect sunburned skin by wearing clothing that covers the injured skin.  Do not put ice on your sunburn. This can cause further damage. Try taking a cool bath or applying a cool, wet cloth (cool compress) to your skin. This may help with pain.  Drink enough fluid to keep your urine clear or pale yellow.  Try applying aloe vera or a moisturizer that has  soy in it to your sunburn. This may help. Do not apply aloe vera or moisturizer with soy if your sunburn has blisters.  Do not break any blisters that you may have. PREVENTION  Try to avoid the sun between 10:00 a.m. and 2:00 p.m. when it is the strongest.  Apply sunscreen at least 15 minutes before exposure to the sun.  Apply a sunscreen with an SPF of 15 or higher. Consider using an SPF of 30 or higher if you will be exposed to the sun for prolonged periods of time. Use a sunscreen that protects against all of the sun's rays (broad-spectrum) and is water-resistant.  Reapply sunscreen: ? About every two hours during sun exposure. ? More often when sweating a lot while out in the sun. ? After getting wet from swimming or playing in water.  Wear long sleeves, a hat, and sunglasses that block UV light when you are outside.  Talk with your health care provider about medicines, herbs, and foods that can make you more sensitive to light. Avoid these, if possible.  Do not use tanning beds. SEEK MEDICAL CARE IF:  You have a fever.  Your symptoms do not improve with treatment.  Your pain is not controlled with medicine.  Your burn becomes more painful and swollen. SEEK IMMEDIATE MEDICAL CARE IF:  You start to vomit or have diarrhea.  You feel faint or you pass out.  You have a headache and you feel confused.  You develop severe blistering.  You have pus or fluid coming from the blisters. This information is not intended to replace   advice given to you by your health care provider. Make sure you discuss any questions you have with your health care provider. Document Released: 01/26/2005 Document Revised: 08/10/2015 Document Reviewed: 10/20/2014 Elsevier Interactive Patient Education  2017 [employer] Inc[employer]  

## 2017-09-09 LAB — BASIC METABOLIC PANEL
BUN/Creatinine Ratio: 21 (ref 12–28)
BUN: 15 mg/dL (ref 8–27)
CO2: 21 mmol/L (ref 20–29)
Calcium: 9.7 mg/dL (ref 8.7–10.3)
Chloride: 93 mmol/L — ABNORMAL LOW (ref 96–106)
Creatinine, Ser: 0.7 mg/dL (ref 0.57–1.00)
GFR calc Af Amer: 96 mL/min/{1.73_m2} (ref >59)
GFR calc non Af Amer: 83 mL/min/{1.73_m2} (ref >59)
Glucose: 132 mg/dL — ABNORMAL HIGH (ref 65–99)
Potassium: 4.6 mmol/L (ref 3.5–5.2)
Sodium: 129 mmol/L — ABNORMAL LOW (ref 134–144)

## 2017-09-11 ENCOUNTER — Telehealth: Payer: Self-pay

## 2017-09-11 NOTE — Telephone Encounter (Signed)
Viewed by [name] on 09/11/2017 8:29 AM

## 2017-09-11 NOTE — Telephone Encounter (Signed)
-----   Message from Erasmo Downer, MD sent at 09/11/2017  8:23 AM EDT ----- Normal kidney function.  Sodium s stable, but low.  Recommend avoiding HCTZ.  Erasmo Downer, MD, MPH [facility] 09/11/2017 8:23 AM

## 2017-09-13 DIAGNOSIS — L249 Irritant contact dermatitis, unspecified cause: Secondary | ICD-10-CM | POA: Diagnosis not present

## 2017-09-13 DIAGNOSIS — D692 Other nonthrombocytopenic purpura: Secondary | ICD-10-CM | POA: Diagnosis not present

## 2017-09-20 ENCOUNTER — Other Ambulatory Visit: Payer: Self-pay | Admitting: Family Medicine

## 2017-09-20 ENCOUNTER — Other Ambulatory Visit: Payer: Self-pay | Admitting: Cardiovascular Disease

## 2017-09-20 DIAGNOSIS — I10 Essential (primary) hypertension: Secondary | ICD-10-CM

## 2017-10-19 DIAGNOSIS — L249 Irritant contact dermatitis, unspecified cause: Secondary | ICD-10-CM | POA: Diagnosis not present

## 2017-10-19 DIAGNOSIS — D692 Other nonthrombocytopenic purpura: Secondary | ICD-10-CM | POA: Diagnosis not present

## 2017-11-07 DIAGNOSIS — H353131 Nonexudative age-related macular degeneration, bilateral, early dry stage: Secondary | ICD-10-CM | POA: Diagnosis not present

## 2017-11-08 DIAGNOSIS — H6060 Unspecified chronic otitis externa, unspecified ear: Secondary | ICD-10-CM | POA: Diagnosis not present

## 2017-11-08 DIAGNOSIS — H6123 Impacted cerumen, bilateral: Secondary | ICD-10-CM | POA: Diagnosis not present

## 2017-11-28 ENCOUNTER — Ambulatory Visit (INDEPENDENT_AMBULATORY_CARE_PROVIDER_SITE_OTHER): Payer: Self-pay | Admitting: Vascular Surgery

## 2017-11-28 ENCOUNTER — Encounter (INDEPENDENT_AMBULATORY_CARE_PROVIDER_SITE_OTHER): Payer: Self-pay

## 2017-12-01 ENCOUNTER — Encounter (INDEPENDENT_AMBULATORY_CARE_PROVIDER_SITE_OTHER): Payer: Self-pay

## 2017-12-01 ENCOUNTER — Ambulatory Visit (INDEPENDENT_AMBULATORY_CARE_PROVIDER_SITE_OTHER): Payer: PPO | Admitting: Vascular Surgery

## 2017-12-07 ENCOUNTER — Other Ambulatory Visit (INDEPENDENT_AMBULATORY_CARE_PROVIDER_SITE_OTHER): Payer: Self-pay | Admitting: Vascular Surgery

## 2017-12-07 DIAGNOSIS — I739 Peripheral vascular disease, unspecified: Secondary | ICD-10-CM

## 2017-12-08 ENCOUNTER — Ambulatory Visit (INDEPENDENT_AMBULATORY_CARE_PROVIDER_SITE_OTHER): Payer: PPO

## 2017-12-08 DIAGNOSIS — I739 Peripheral vascular disease, unspecified: Secondary | ICD-10-CM

## 2017-12-11 ENCOUNTER — Ambulatory Visit (INDEPENDENT_AMBULATORY_CARE_PROVIDER_SITE_OTHER): Payer: PPO | Admitting: Family Medicine

## 2017-12-11 ENCOUNTER — Encounter: Payer: Self-pay | Admitting: Family Medicine

## 2017-12-11 VITALS — BP 142/62 | HR 72 | Temp 97.8°F | Resp 16 | Wt 141.0 lb

## 2017-12-11 DIAGNOSIS — E538 Deficiency of other specified B group vitamins: Secondary | ICD-10-CM | POA: Diagnosis not present

## 2017-12-11 DIAGNOSIS — M62838 Other muscle spasm: Secondary | ICD-10-CM | POA: Diagnosis not present

## 2017-12-11 DIAGNOSIS — E1159 Type 2 diabetes mellitus with other circulatory complications: Secondary | ICD-10-CM | POA: Diagnosis not present

## 2017-12-11 DIAGNOSIS — E559 Vitamin D deficiency, unspecified: Secondary | ICD-10-CM | POA: Diagnosis not present

## 2017-12-11 DIAGNOSIS — H26492 Other secondary cataract, left eye: Secondary | ICD-10-CM | POA: Diagnosis not present

## 2017-12-11 DIAGNOSIS — Z79899 Other long term (current) drug therapy: Secondary | ICD-10-CM | POA: Diagnosis not present

## 2017-12-11 DIAGNOSIS — E871 Hypo-osmolality and hyponatremia: Secondary | ICD-10-CM

## 2017-12-11 DIAGNOSIS — R5383 Other fatigue: Secondary | ICD-10-CM | POA: Diagnosis not present

## 2017-12-11 LAB — POCT GLYCOSYLATED HEMOGLOBIN (HGB A1C)
Est. average glucose Bld gHb Est-mCnc: 146
Hemoglobin A1C: 6.7 % — AB (ref 4.0–5.6)

## 2017-12-11 MED ORDER — BACLOFEN 5 MG PO TABS: 5.0000 mg | ORAL_TABLET | Freq: Three times a day (TID) | ORAL | 1 refills | Status: DC | PRN

## 2017-12-11 NOTE — Progress Notes (Signed)
Patient: Jessica Wheeler Female    DOB: 1940/07/20   77 y.o.   MRN: [id number] Visit Date: 12/11/2017  Today's Provider: Shirlee Latch, MD   I, Joslyn Hy, CMA, am acting as scribe for Shirlee Latch, MD.  Chief Complaint  Patient presents with  . Diabetes  . Fatigue   Subjective:    HPI      Diabetes Mellitus Type II, Follow-up:   Lab Results  Component Value Date   HGBA1C 6.8 (H) 06/29/2017   HGBA1C 6.6 03/08/2017    Last seen for diabetes 6 months ago.  Management since then includes none; A1C was stable at 6.8%. She reports good compliance with treatment. She is not having side effects.  Current symptoms include visual disturbances and have been stable. Home blood sugar records: fasting range: 120's-130's. Was "high" this morning in the 140's.  Episodes of hypoglycemia? no   Most Recent Eye Exam: January 2019; negative Weight trend: stable  Pertinent Labs:    Component Value Date/Time   CHOL 181 03/08/2017   TRIG 62 03/08/2017   HDL 77 (A) 03/08/2017   LDLCALC 92 03/08/2017   CREATININE 0.70 09/08/2017 0846   CREATININE 0.72 10/01/2011 1322    Wt Readings from Last 3 Encounters:  12/11/17 141 lb (64 kg)  09/08/17 140 lb (63.5 kg)  06/29/17 145 lb (65.8 kg)    ------------------------------------------------------------------------ Pt is also c/o decreased energy and malaise x 1 month. She initially thought this might be due to the heat. She has noticed body aches and neck pain (is requesting a muscle relaxer for neck/back spasms). She states "I can not pin point exactly what is wrong with me". This feeling is worsening. She is not walking as much as used to. She is having difficulty getting out of bed. She has signed up for water aerobic classes, hoping this would increase her energy level, without success. She is concerned about possible heart diease causing these sx. She has noticed her chart has the diagnosis of CAD, and she is  unsure of the meaning behind this diagnosis. She is followed by Cardiology and was diagnosed by Cath in 2007).  She denies chest pain, but does notice some SOB. She is also c/o some dizziness.  She changed from PO to sublingual B12 supplement.  Allergies  Allergen Reactions  . Beta Adrenergic Blockers     Chest pressure/difficulty breathing  . Levaquin [Levofloxacin In D5w]     Low blood sugar; does take this when she needs it. She just makes sure to control her BS to avoid hypoglycemia.  . Lisinopril Cough  . Prevnar [Pneumococcal 13-Val Conj Vacc] Rash    Joint Pain  . Sulfa Antibiotics Rash    "makes my skin raw"     Current Outpatient Medications:  .  amiloride-hydrochlorothiazide (MODURETIC) 5-50 MG tablet, TAKE 1/2 TABLET BY MOUTH DAILY AS NEEDED FOR SWELLING, Disp: 45 tablet, Rfl: 2 .  amLODipine (NORVASC) 5 MG tablet, TAKE 1 TABLET(5 MG) BY MOUTH DAILY, Disp: 90 tablet, Rfl: 2 .  aspirin 81 MG EC tablet, Take 81 mg by mouth daily.  , Disp: , Rfl:  .  Cyanocobalamin (VITAMIN B-12) 1000 MCG/15ML LIQD, Take 2,500 mcg by mouth daily. , Disp: , Rfl:  .  Ibuprofen (ADVIL) 200 MG CAPS, Take 1 capsule by mouth daily.  , Disp: , Rfl:  .  latanoprost (XALATAN) 0.005 % ophthalmic solution, Reported on 10/12/2015 - both eyes nightly, Disp: , Rfl: 3 .  losartan (COZAAR) 100 MG tablet, TAKE 1 TABLET BY MOUTH EVERY DAY, Disp: 90 tablet, Rfl: 2 .  MAGNESIUM PO, Take 250 mg by mouth daily., Disp: , Rfl:  .  metFORMIN (GLUCOPHAGE) 500 MG tablet, TAKE 2 TABLETS(1000 MG) BY MOUTH TWICE DAILY, Disp: 360 tablet, Rfl: 0  Review of Systems  Constitutional: Positive for activity change and fatigue. Negative for appetite change, chills, diaphoresis, fever and unexpected weight change.  HENT: Negative.   Eyes: Positive for visual disturbance (has "laser surgery" this afternoon). Negative for pain, discharge and itching.  Respiratory: Positive for shortness of breath. Negative for apnea, cough, choking,  chest tightness, wheezing and stridor.   Cardiovascular: Negative for chest pain, palpitations and leg swelling.  Gastrointestinal: Negative for abdominal distention.  Endocrine: Negative for polydipsia, polyphagia and polyuria.  Genitourinary: Negative.   Musculoskeletal: Positive for back pain, myalgias and neck pain.  Skin: Positive for color change. Negative for pallor, rash and wound.  Neurological: Positive for dizziness and weakness. Negative for tremors, seizures, syncope, facial asymmetry, speech difficulty, light-headedness, numbness and headaches.  Psychiatric/Behavioral: Negative.     Social History   Tobacco Use  . Smoking status: Former Smoker    Packs/day: 1.00    Years: 15.00    Pack years: 15.00    Types: Cigarettes    Last attempt to quit: 08/03/2003    Years since quitting: 14.3  . Smokeless tobacco: Never Used  Substance Use Topics  . Alcohol use: No   Objective:   BP (!) 142/62 (BP Location: Right Arm, Patient Position: Sitting, Cuff Size: Normal)   Pulse 72   Temp 97.8 F (36.6 C) (Oral)   Resp 16   Wt 141 lb (64 kg)   SpO2 99%   BMI 24.98 kg/m  Vitals:   12/11/17 0825  BP: (!) 142/62  Pulse: 72  Resp: 16  Temp: 97.8 F (36.6 C)  TempSrc: Oral  SpO2: 99%  Weight: 141 lb (64 kg)     Physical Exam  Constitutional: She is oriented to person, place, and time. She appears well-developed and well-nourished. No distress.  HENT:  Head: Normocephalic and atraumatic.  Eyes: Conjunctivae are normal. No scleral icterus.  Neck: Neck supple. No thyromegaly present.  Cardiovascular: Normal rate, regular rhythm and intact distal pulses.  Murmur heard. Pulmonary/Chest: Effort normal and breath sounds normal. No respiratory distress. She has no wheezes. She has no rales.  Abdominal: Soft. She exhibits no distension. There is no tenderness.  Musculoskeletal: She exhibits no edema.  Lymphadenopathy:    She has no cervical adenopathy.  Neurological: She is  alert and oriented to person, place, and time. No cranial nerve deficit or sensory deficit. She exhibits normal muscle tone.  Skin: Skin is warm and dry. Capillary refill takes less than 2 seconds. No rash noted.  Psychiatric: She has a normal mood and affect. Her behavior is normal.  Vitals reviewed.   Results for orders placed or performed in visit on 12/11/17  POCT glycosylated hemoglobin (Hb A1C)  Result Value Ref Range   Hemoglobin A1C 6.7 (A) 4.0 - 5.6 %   Est. average glucose Bld gHb Est-mCnc 146        Assessment & Plan:   Problem List Items Addressed This Visit      Endocrine   DM (diabetes mellitus) (HCC) - Primary    Well controlled with A1c 6.7 UTD on vaccines, eye exam, and foot exam On ARB F/u in ~6 months Continue Metformin  Relevant Orders   POCT glycosylated hemoglobin (Hb A1C) (Completed)     Other   Chronic hyponatremia    Likely 2/2 diuretic use Patient has been hesitant to d/c Will recheck CMP      Relevant Orders   Comprehensive metabolic panel   Muscle spasm    Muscle spasms of neck and back Worse after water aerobics Okay to use baclofen 3 times daily as needed Discussed return precautions Discussed using this with caution especially given her age      Fatigue    Present x1 month No cardiac symptoms, but she is planning f/u for CAD Will check for possible causes with labs - CBC, CMP, Vit D, B12, TSH Continue regular exercise F/u in 6 weeks to eval further      Relevant Orders   Comprehensive metabolic panel   [id number]   CBC   TSH   VITAMIN D 25 Hydroxy (Vit-D Deficiency, Fractures)    Other Visit Diagnoses    Long-term use of high-risk medication       Relevant Orders   Comprehensive metabolic panel   [id number]   CBC   B12 deficiency       Relevant Orders   B12   Avitaminosis D       Relevant Orders   VITAMIN D 25 Hydroxy (Vit-D Deficiency, Fractures)       Return in about 6 weeks (around 01/22/2018) for fatigue  f/u.   The entirety of the information documented in the History of Present Illness, Review of Systems and Physical Exam were personally obtained by me. Portions of this information were initially documented by Irving BurtonEmily Ratchford, CMA and reviewed by me for thoroughness and accuracy.    Erasmo DownerBacigalupo, Angela M, MD, MPH [facility] 12/11/2017 9:34 AM

## 2017-12-11 NOTE — Assessment & Plan Note (Signed)
Muscle spasms of neck and back Worse after water aerobics Okay to use baclofen 3 times daily as needed Discussed return precautions Discussed using this with caution especially given her age

## 2017-12-11 NOTE — Assessment & Plan Note (Signed)
Likely 2/2 diuretic use Patient has been hesitant to d/c Will recheck CMP

## 2017-12-11 NOTE — Patient Instructions (Signed)

## 2017-12-11 NOTE — Assessment & Plan Note (Signed)
Well controlled with A1c 6.7 UTD on vaccines, eye exam, and foot exam On ARB F/u in ~6 months Continue Metformin

## 2017-12-11 NOTE — Assessment & Plan Note (Signed)
Present x1 month No cardiac symptoms, but she is planning f/u for CAD Will check for possible causes with labs - CBC, CMP, Vit D, B12, TSH Continue regular exercise F/u in 6 weeks to eval further

## 2017-12-12 ENCOUNTER — Encounter (INDEPENDENT_AMBULATORY_CARE_PROVIDER_SITE_OTHER): Payer: Self-pay | Admitting: Vascular Surgery

## 2017-12-12 ENCOUNTER — Telehealth: Payer: Self-pay

## 2017-12-12 ENCOUNTER — Ambulatory Visit (INDEPENDENT_AMBULATORY_CARE_PROVIDER_SITE_OTHER): Payer: PPO | Admitting: Vascular Surgery

## 2017-12-12 VITALS — BP 149/77 | HR 78 | Resp 16 | Ht 63.0 in | Wt 143.6 lb

## 2017-12-12 DIAGNOSIS — M542 Cervicalgia: Secondary | ICD-10-CM | POA: Diagnosis not present

## 2017-12-12 DIAGNOSIS — E782 Mixed hyperlipidemia: Secondary | ICD-10-CM | POA: Diagnosis not present

## 2017-12-12 DIAGNOSIS — M5136 Other intervertebral disc degeneration, lumbar region: Secondary | ICD-10-CM | POA: Diagnosis not present

## 2017-12-12 DIAGNOSIS — I7102 Dissection of abdominal aorta: Secondary | ICD-10-CM

## 2017-12-12 DIAGNOSIS — I10 Essential (primary) hypertension: Secondary | ICD-10-CM

## 2017-12-12 DIAGNOSIS — M546 Pain in thoracic spine: Secondary | ICD-10-CM | POA: Diagnosis not present

## 2017-12-12 DIAGNOSIS — E1159 Type 2 diabetes mellitus with other circulatory complications: Secondary | ICD-10-CM | POA: Diagnosis not present

## 2017-12-12 DIAGNOSIS — M5431 Sciatica, right side: Secondary | ICD-10-CM | POA: Diagnosis not present

## 2017-12-12 DIAGNOSIS — M9903 Segmental and somatic dysfunction of lumbar region: Secondary | ICD-10-CM | POA: Diagnosis not present

## 2017-12-12 DIAGNOSIS — M9902 Segmental and somatic dysfunction of thoracic region: Secondary | ICD-10-CM | POA: Diagnosis not present

## 2017-12-12 DIAGNOSIS — M9901 Segmental and somatic dysfunction of cervical region: Secondary | ICD-10-CM | POA: Diagnosis not present

## 2017-12-12 LAB — COMPREHENSIVE METABOLIC PANEL
ALT: 32 IU/L (ref 0–32)
AST: 27 IU/L (ref 0–40)
Albumin/Globulin Ratio: 1.7 (ref 1.2–2.2)
Albumin: 4.5 g/dL (ref 3.5–4.8)
Alkaline Phosphatase: 47 IU/L (ref 39–117)
BUN/Creatinine Ratio: 21 (ref 12–28)
BUN: 15 mg/dL (ref 8–27)
Bilirubin Total: 0.4 mg/dL (ref 0.0–1.2)
CO2: 20 mmol/L (ref 20–29)
Calcium: 9.7 mg/dL (ref 8.7–10.3)
Chloride: 96 mmol/L (ref 96–106)
Creatinine, Ser: 0.72 mg/dL (ref 0.57–1.00)
GFR calc Af Amer: 93 mL/min/{1.73_m2} (ref >59)
GFR calc non Af Amer: 80 mL/min/{1.73_m2} (ref >59)
Globulin, Total: 2.6 g/dL (ref 1.5–4.5)
Glucose: 129 mg/dL — ABNORMAL HIGH (ref 65–99)
Potassium: 4.4 mmol/L (ref 3.5–5.2)
Sodium: 132 mmol/L — ABNORMAL LOW (ref 134–144)
Total Protein: 7.1 g/dL (ref 6.0–8.5)

## 2017-12-12 LAB — VITAMIN D 25 HYDROXY (VIT D DEFICIENCY, FRACTURES): Vit D, 25-Hydroxy: 29.2 ng/mL — ABNORMAL LOW (ref 30.0–100.0)

## 2017-12-12 LAB — CBC
Hematocrit: 39.8 % (ref 34.0–46.6)
Hemoglobin: 13.5 g/dL (ref 11.1–15.9)
MCH: 31.1 pg (ref 26.6–33.0)
MCHC: 33.9 g/dL (ref 31.5–35.7)
MCV: 92 fL (ref 79–97)
Platelets: 270 10*3/uL (ref 150–450)
RBC: 4.34 x10E6/uL (ref 3.77–5.28)
RDW: 13.8 % (ref 12.3–15.4)
WBC: 6.1 10*3/uL (ref 3.4–10.8)

## 2017-12-12 LAB — VITAMIN B12: Vitamin B-12: 1190 pg/mL (ref 232–1245)

## 2017-12-12 LAB — TSH: TSH: 3.88 u[IU]/mL (ref 0.450–4.500)

## 2017-12-12 NOTE — Progress Notes (Signed)
MRN : [id number]  Jessica Wheeler is a 77 y.o. (01-07-1941) female who presents with chief complaint of  Chief Complaint  Patient presents with  . Follow-up    10056yr Aorta Iliac results  .  History of Present Illness: Patient returns today in follow up of aortoiliac atherosclerosis.  She does have chronic low back pain but no major changes or problems since her last visit.  No lifestyle limiting claudication, ischemic rest pain, or ulceration.  Noninvasive studies show mildly elevated velocities in the common iliac arteries bilaterally although this does not appear to be significantly high-grade.  Her aorta is not aneurysmal.  Current Outpatient Medications  Medication Sig Dispense Refill  . amiloride-hydrochlorothiazide (MODURETIC) 5-50 MG tablet TAKE 1/2 TABLET BY MOUTH DAILY AS NEEDED FOR SWELLING 45 tablet 2  . amLODipine (NORVASC) 5 MG tablet TAKE 1 TABLET(5 MG) BY MOUTH DAILY 90 tablet 2  . aspirin 81 MG EC tablet Take 81 mg by mouth daily.      . Baclofen 5 MG TABS Take 5 mg by mouth 3 (three) times daily as needed. 90 tablet 1  . Cyanocobalamin (VITAMIN B-12) 1000 MCG/15ML LIQD Take 2,500 mcg by mouth daily.     . Ibuprofen (ADVIL) 200 MG CAPS Take 1 capsule by mouth daily.      Marland Kitchen. latanoprost (XALATAN) 0.005 % ophthalmic solution Reported on 10/12/2015 - both eyes nightly  3  . losartan (COZAAR) 100 MG tablet TAKE 1 TABLET BY MOUTH EVERY DAY 90 tablet 2  . MAGNESIUM PO Take 250 mg by mouth daily.    . metFORMIN (GLUCOPHAGE) 500 MG tablet TAKE 2 TABLETS(1000 MG) BY MOUTH TWICE DAILY 360 tablet 0   No current facility-administered medications for this visit.     Past Medical History:  Diagnosis Date  . Allergy   . Arthritis   . Bundle branch block, right   . Diabetes mellitus   . Diffuse cystic mastopathy   . Dissection, aorta (HCC)    Dr.Rejeana Fadness follows  . Diverticulosis   . Family history of malignant neoplasm of gastrointestinal tract   . Glaucoma 2005  . Heart murmur     . History of bronchitis   . History of pancreatitis 2005  . Hypertension 1982  . Personal history of tobacco use, presenting hazards to health   . RBBB   . Ulcer     Past Surgical History:  Procedure Laterality Date  . ABDOMINAL HYSTERECTOMY  1991  . APPENDECTOMY    . BACK SURGERY  1982   for lumbar ruptured disc. No hardware, no discectomy, no fusion  . BLADDER SURGERY    . BREAST BIOPSY Right 1980's  . BREAST SURGERY     biopsy  . CARDIAC CATHETERIZATION     Dr. Lady GaryFath, few years ago  . CATARACT EXTRACTION W/PHACO Left 08/31/2015   Procedure: CATARACT EXTRACTION PHACO AND INTRAOCULAR LENS PLACEMENT (IOC);  Surgeon: Sherald HessAnita Prakash Vin-Parikh, MD;  [facility];  Service: Ophthalmology;  Laterality: Left;  DIABETIC - oral meds  . CATARACT EXTRACTION W/PHACO Right 10/12/2015   Procedure: CATARACT EXTRACTION PHACO AND INTRAOCULAR LENS PLACEMENT (IOC) right eye;  Surgeon: Sherald HessAnita Prakash Vin-Parikh, MD;  [facility];  Service: Ophthalmology;  Laterality: Right;  DIABETIC - oral meds  . CHOLECYSTECTOMY  1994  . COLONOSCOPY  2008,02/22/2012   Dr. Sankar-2013  . COLONOSCOPY WITH PROPOFOL N/A 04/04/2017   Procedure: COLONOSCOPY WITH PROPOFOL;  Surgeon: Jessica BrightlySankar, Seeplaputhur G, MD;  Location: [facility];  Service: Endoscopy;  Laterality: N/A;    Social History Social History   Tobacco Use  . Smoking status: Former Smoker    Packs/day: 1.00    Years: 15.00    Pack years: 15.00    Types: Cigarettes    Last attempt to quit: 08/03/2003    Years since quitting: 14.3  . Smokeless tobacco: Never Used  Substance Use Topics  . Alcohol use: No  . Drug use: No      Family History Family History  Problem Relation Age of Onset  . Diabetes Mother   . Hypertension Mother   . Anxiety disorder Mother   . Heart disease Father        rheumatic heart disease  . Heart attack Father   . Rheum arthritis Father   . Colon cancer Brother   . Hypertension  Maternal Grandmother   . Hypertension Maternal Grandfather   . Breast cancer Maternal Aunt 60  . Breast cancer Cousin 50     Allergies  Allergen Reactions  . Beta Adrenergic Blockers     Chest pressure/difficulty breathing  . Levaquin [Levofloxacin In D5w]     Low blood sugar; does take this when she needs it. She just makes sure to control her BS to avoid hypoglycemia.  . Lisinopril Cough  . Prevnar [Pneumococcal 13-Val Conj Vacc] Rash    Joint Pain  . Sulfa Antibiotics Rash    "makes my skin raw"     REVIEW OF SYSTEMS (Negative unless checked)  Constitutional: [] Weight loss  [] Fever  [] Chills Cardiac: [] Chest pain   [] Chest pressure   [] Palpitations   [] Shortness of breath when laying flat   [] Shortness of breath at rest   [] Shortness of breath with exertion. Vascular:  [] Pain in legs with walking   [] Pain in legs at rest   [] Pain in legs when laying flat   [] Claudication   [] Pain in feet when walking  [] Pain in feet at rest  [] Pain in feet when laying flat   [] History of DVT   [] Phlebitis   [] Swelling in legs   [] Varicose veins   [] Non-healing ulcers Pulmonary:   [] Uses home oxygen   [] Productive cough   [] Hemoptysis   [] Wheeze  [] COPD   [] Asthma Neurologic:  [] Dizziness  [] Blackouts   [] Seizures   [] History of stroke   [] History of TIA  [] Aphasia   [] Temporary blindness   [] Dysphagia   [] Weakness or numbness in arms   [] Weakness or numbness in legs Musculoskeletal:  [x] Arthritis   [] Joint swelling   [x] Joint pain   [x] Low back pain Hematologic:  [] Easy bruising  [] Easy bleeding   [] Hypercoagulable state   [] Anemic   Gastrointestinal:  [] Blood in stool   [] Vomiting blood  [x] Gastroesophageal reflux/heartburn   [] Abdominal pain Genitourinary:  [] Chronic kidney disease   [] Difficult urination  [] Frequent urination  [] Burning with urination   [] Hematuria Skin:  [] Rashes   [] Ulcers   [] Wounds Psychological:  [] History of anxiety   []  History of major depression.  Physical  Examination  BP (!) 149/77 (BP Location: Right Arm)   Pulse 78   Resp 16   Ht 5\' 3"  (1.6 m)   Wt 143 lb 9.6 oz (65.1 kg)   BMI 25.44 kg/m  Gen:  WD/WN, NAD.  Appears younger than stated age Head: [place]/AT, No temporalis wasting. Ear/Nose/Throat: Hearing grossly intact, nares w/o erythema or drainage Eyes: Conjunctiva clear. Sclera non-icteric Neck: Supple.  Trachea midline Pulmonary:  Good air movement, no use of accessory muscles.  Cardiac: RRR, no JVD Vascular:  Vessel Right Left  Radial Palpable Palpable                          PT Palpable Palpable  DP Palpable Palpable    Musculoskeletal: M/S 5/5 throughout.  No deformity or atrophy.  No edema. Neurologic: Sensation grossly intact in extremities.  Symmetrical.  Speech is fluent.  Psychiatric: Judgment intact, Mood & affect appropriate for pt's clinical situation. Dermatologic: No rashes or ulcers noted.  No cellulitis or open wounds.       Labs Recent Results (from the past 2160 hour(s))  POCT glycosylated hemoglobin (Hb A1C)     Status: Abnormal   Collection Time: 12/11/17  8:31 AM  Result Value Ref Range   Hemoglobin A1C 6.7 (A) 4.0 - 5.6 %   Est. average glucose Bld gHb Est-mCnc 146   Comprehensive metabolic panel     Status: Abnormal   Collection Time: 12/11/17  9:02 AM  Result Value Ref Range   Glucose 129 (H) 65 - 99 mg/dL   BUN 15 8 - 27 mg/dL   Creatinine, Ser 0.98 0.57 - 1.00 mg/dL   GFR calc non Af Amer 80 >59 mL/min/1.73   GFR calc Af Amer 93 >59 mL/min/1.73   BUN/Creatinine Ratio 21 12 - 28   Sodium 132 (L) 134 - 144 mmol/L   Potassium 4.4 3.5 - 5.2 mmol/L   Chloride 96 96 - 106 mmol/L   CO2 20 20 - 29 mmol/L   Calcium 9.7 8.7 - 10.3 mg/dL   Total Protein 7.1 6.0 - 8.5 g/dL   Albumin 4.5 3.5 - 4.8 g/dL   Globulin, Total 2.6 1.5 - 4.5 g/dL   Albumin/Globulin Ratio 1.7 1.2 - 2.2   Bilirubin Total 0.4 0.0 - 1.2 mg/dL   Alkaline Phosphatase 47 39 - 117 IU/L   AST 27 0 - 40 IU/L   ALT 32 0 -  32 IU/L  B12     Status: None   Collection Time: 12/11/17  9:02 AM  Result Value Ref Range   Vitamin B-12 1,190 232 - 1,245 pg/mL  CBC     Status: None   Collection Time: 12/11/17  9:02 AM  Result Value Ref Range   WBC 6.1 3.4 - 10.8 x10E3/uL   RBC 4.34 3.77 - 5.28 x10E6/uL   Hemoglobin 13.5 11.1 - 15.9 g/dL   Hematocrit 11.9 14.7 - 46.6 %   MCV 92 79 - 97 fL   MCH 31.1 26.6 - 33.0 pg   MCHC 33.9 31.5 - 35.7 g/dL   RDW 82.9 56.2 - 13.0 %   Platelets 270 150 - 450 x10E3/uL  TSH     Status: None   Collection Time: 12/11/17  9:02 AM  Result Value Ref Range   TSH 3.880 0.450 - 4.500 uIU/mL  VITAMIN D 25 Hydroxy (Vit-D Deficiency, Fractures)     Status: Abnormal   Collection Time: 12/11/17  9:02 AM  Result Value Ref Range   Vit D, 25-Hydroxy 29.2 (L) 30.0 - 100.0 ng/mL    Comment: Vitamin D deficiency has been defined by the Institute of Medicine and an Endocrine Society practice guideline as a level of serum 25-OH vitamin D less than 20 ng/mL (1,2). The Endocrine Society went on to further define vitamin D insufficiency as a level between 21 and 29 ng/mL (2). 1. IOM (Institute of Medicine). 2010. Dietary reference    intakes for calcium and  D. [place]: The    [employer]. 2. Holick MF, Binkley Enhaut, Bischoff-Ferrari HA, et al.    Evaluation, treatment, and prevention of vitamin D    deficiency: an Endocrine Society clinical practice    guideline. JCEM. 2011 Jul; 96(7):1911-30.     Radiology No results found.  Assessment/Plan  DM (diabetes mellitus) blood glucose control important in reducing the progression of atherosclerotic disease. Also, involved in wound healing. On appropriate medications.   HTN (hypertension) blood pressure control important in reducing the progression of atherosclerotic disease. On appropriate oral medications.   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HCC) Noninvasive studies show mildly elevated velocities in the common iliac  arteries bilaterally although this does not appear to be significantly high-grade.  Her aorta is not aneurysmal.  The previous dissection seems to have healed.  No significant aneurysmal degeneration or malperfusion at this point.  I think checking this every other year should be sufficient.    Festus BarrenJason Meggin Ola, MD  12/12/2017 10:52 AM    This note was created with Dragon medical transcription system.  Any errors from dictation are purely unintentional

## 2017-12-12 NOTE — Assessment & Plan Note (Signed)
Noninvasive studies show mildly elevated velocities in the common iliac arteries bilaterally although this does not appear to be significantly high-grade.  Her aorta is not aneurysmal.  The previous dissection seems to have healed.  No significant aneurysmal degeneration or malperfusion at this point.  I think checking this every other year should be sufficient.

## 2017-12-12 NOTE — Telephone Encounter (Signed)
Left message advising pt. Ok per DPR. 

## 2017-12-12 NOTE — Assessment & Plan Note (Signed)
blood glucose control important in reducing the progression of atherosclerotic disease. Also, involved in wound healing. On appropriate medications.  

## 2017-12-12 NOTE — Telephone Encounter (Signed)
-----   Message from Erasmo DownerAngela M Bacigalupo, MD sent at 12/12/2017  8:17 AM EDT ----- Normal kidney function, liver function, thyroid function, B12 levels, blood counts.  Sodium is still low but better than it usually is.  Vitamin D level is low.  This could be the cause of fatigue.  Start OTC Vit D3 supplement 2000 units per day.  Erasmo DownerBacigalupo, Angela M, MD, MPH [facility] 12/12/2017 8:17 AM

## 2017-12-12 NOTE — Assessment & Plan Note (Signed)
blood pressure control important in reducing the progression of atherosclerotic disease. On appropriate oral medications.  

## 2017-12-13 DIAGNOSIS — M546 Pain in thoracic spine: Secondary | ICD-10-CM | POA: Diagnosis not present

## 2017-12-13 DIAGNOSIS — M542 Cervicalgia: Secondary | ICD-10-CM | POA: Diagnosis not present

## 2017-12-13 DIAGNOSIS — M9901 Segmental and somatic dysfunction of cervical region: Secondary | ICD-10-CM | POA: Diagnosis not present

## 2017-12-13 DIAGNOSIS — M9903 Segmental and somatic dysfunction of lumbar region: Secondary | ICD-10-CM | POA: Diagnosis not present

## 2017-12-13 DIAGNOSIS — M5136 Other intervertebral disc degeneration, lumbar region: Secondary | ICD-10-CM | POA: Diagnosis not present

## 2017-12-13 DIAGNOSIS — M5431 Sciatica, right side: Secondary | ICD-10-CM | POA: Diagnosis not present

## 2017-12-13 DIAGNOSIS — M9902 Segmental and somatic dysfunction of thoracic region: Secondary | ICD-10-CM | POA: Diagnosis not present

## 2017-12-14 DIAGNOSIS — M9903 Segmental and somatic dysfunction of lumbar region: Secondary | ICD-10-CM | POA: Diagnosis not present

## 2017-12-14 DIAGNOSIS — M9902 Segmental and somatic dysfunction of thoracic region: Secondary | ICD-10-CM | POA: Diagnosis not present

## 2017-12-14 DIAGNOSIS — M5431 Sciatica, right side: Secondary | ICD-10-CM | POA: Diagnosis not present

## 2017-12-14 DIAGNOSIS — M542 Cervicalgia: Secondary | ICD-10-CM | POA: Diagnosis not present

## 2017-12-14 DIAGNOSIS — M5136 Other intervertebral disc degeneration, lumbar region: Secondary | ICD-10-CM | POA: Diagnosis not present

## 2017-12-14 DIAGNOSIS — M9901 Segmental and somatic dysfunction of cervical region: Secondary | ICD-10-CM | POA: Diagnosis not present

## 2017-12-14 DIAGNOSIS — M546 Pain in thoracic spine: Secondary | ICD-10-CM | POA: Diagnosis not present

## 2017-12-18 ENCOUNTER — Ambulatory Visit (INDEPENDENT_AMBULATORY_CARE_PROVIDER_SITE_OTHER): Payer: PPO | Admitting: Family Medicine

## 2017-12-18 ENCOUNTER — Encounter: Payer: Self-pay | Admitting: Family Medicine

## 2017-12-18 VITALS — BP 152/66 | HR 80 | Temp 97.9°F | Resp 20

## 2017-12-18 DIAGNOSIS — M62838 Other muscle spasm: Secondary | ICD-10-CM | POA: Diagnosis not present

## 2017-12-18 DIAGNOSIS — M542 Cervicalgia: Secondary | ICD-10-CM | POA: Diagnosis not present

## 2017-12-18 MED ORDER — PREDNISONE 20 MG PO TABS: 40.0000 mg | ORAL_TABLET | Freq: Every day | ORAL | 0 refills | Status: AC

## 2017-12-18 MED ORDER — BACLOFEN 5 MG PO TABS: 10.0000 mg | ORAL_TABLET | Freq: Three times a day (TID) | ORAL | 1 refills | Status: DC | PRN

## 2017-12-18 NOTE — Progress Notes (Signed)
Patient: Jessica Wheeler Female    DOB: [birth year]   77 y.o.   MRN: [id number] Visit Date: 12/18/2017  Today's Provider: Shirlee LatchAngela Graylon Amory, MD   I, Joslyn HyEmily Ratchford, CMA, am acting as scribe for Shirlee LatchAngela Trevian Hayashida, MD.   Chief Complaint  Patient presents with  . Shoulder Pain   Subjective:    Shoulder Pain   The pain is present in the neck, left shoulder, left arm, back and left elbow. Chronicity: LOV was 12/11/2017, and pt was started on Baclofen. The problem has been gradually worsening. Quality: "tightness" The pain is severe. Associated symptoms include joint swelling (left elbow), a limited range of motion and stiffness. Pertinent negatives include no fever, inability to bear weight, itching, joint locking, numbness or tingling. Treatments tried: Baclofen, topical diclofenac, BioFreeze, advil, aleve, meloxicam 7.5 mg. The treatment provided no relief.  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 Denies chest pain, other than when she took meloxicam (normal side effect per pt), palpitations, N/V, weakness. Notes "a tiny little bit of SOB".  She describes the pain as a constant tightness.  This is very different than previous sciatica pain that she has had from low back issues which was more of an electric sharp pain.  Pain is temporarily improved with hot showers and Biofreeze application.  She has noticed that her range of motion is limited and painful.      Allergies  Allergen Reactions  . Beta Adrenergic Blockers     Chest pressure/difficulty breathing  . Levaquin [Levofloxacin In D5w]     Low blood sugar; does take this when she needs it. She just makes sure to control her BS to avoid hypoglycemia.  . Lisinopril Cough  . Prevnar [Pneumococcal 13-Val Conj Vacc] Rash    Joint Pain  . Sulfa Antibiotics Rash    "makes my skin raw"     Current Outpatient Medications:  .  amiloride-hydrochlorothiazide (MODURETIC) 5-50 MG tablet, TAKE 1/2 TABLET BY  MOUTH DAILY AS NEEDED FOR SWELLING, Disp: 45 tablet, Rfl: 2 .  amLODipine (NORVASC) 5 MG tablet, TAKE 1 TABLET(5 MG) BY MOUTH DAILY, Disp: 90 tablet, Rfl: 2 .  aspirin 81 MG EC tablet, Take 81 mg by mouth daily.  , Disp: , Rfl:  .  Baclofen 5 MG TABS, Take 5 mg by mouth 3 (three) times daily as needed., Disp: 90 tablet, Rfl: 1 .  Cyanocobalamin (VITAMIN B-12) 1000 MCG/15ML LIQD, Take 2,500 mcg by mouth daily. , Disp: , Rfl:  .  Ibuprofen (ADVIL) 200 MG CAPS, Take 1 capsule by mouth daily.  , Disp: , Rfl:  .  latanoprost (XALATAN) 0.005 % ophthalmic solution, Reported on 10/12/2015 - both eyes nightly, Disp: , Rfl: 3 .  losartan (COZAAR) 100 MG tablet, TAKE 1 TABLET BY MOUTH EVERY DAY, Disp: 90 tablet, Rfl: 2 .  MAGNESIUM PO, Take 250 mg by mouth daily., Disp: , Rfl:  .  metFORMIN (GLUCOPHAGE) 500 MG tablet, TAKE 2 TABLETS(1000 MG) BY MOUTH TWICE DAILY, Disp: 360 tablet, Rfl: 0  Review of Systems  Constitutional: Negative.  Negative for fever.  Respiratory: Positive for shortness of breath. Negative for apnea, cough, choking and chest tightness.   Cardiovascular: Positive for chest pain and palpitations. Negative for leg swelling.  Gastrointestinal: Negative for nausea and vomiting.  Genitourinary: Negative.   Musculoskeletal: Positive for myalgias, neck pain and stiffness. Negative for arthralgias, back pain, gait problem, joint swelling and neck stiffness.  Shoulder pain and elbow pain   Skin: Negative.  Negative for itching.  Neurological: Negative.  Negative for tingling and numbness.  Psychiatric/Behavioral: Negative.     Social History   Tobacco Use  . Smoking status: Former Smoker    Packs/day: 1.00    Years: 15.00    Pack years: 15.00    Types: Cigarettes    Last attempt to quit: 08/03/2003    Years since quitting: 14.3  . Smokeless tobacco: Never Used  Substance Use Topics  . Alcohol use: No   Objective:   BP (!) 152/66 (BP Location: Left Arm, Patient Position:  Sitting, Cuff Size: Normal)   Pulse 80   Temp 97.9 F (36.6 C) (Oral)   Resp 20  Vitals:   12/18/17 0850  BP: (!) 152/66  Pulse: 80  Resp: 20  Temp: 97.9 F (36.6 C)  TempSrc: Oral     Physical Exam  Constitutional: She is oriented to person, place, and time. She appears well-developed and well-nourished. No distress.  HENT:  Head: Normocephalic and atraumatic.  Eyes: Conjunctivae are normal. No scleral icterus.  Neck: Neck supple. No JVD present. No thyromegaly present.  Cardiovascular: Normal rate, regular rhythm, normal heart sounds and intact distal pulses.  No murmur heard. Pulmonary/Chest: Effort normal and breath sounds normal. No respiratory distress. She has no wheezes. She has no rales.  Musculoskeletal: She exhibits no edema.  Neck: Range of motion somewhat limited in lateral flexion in both directions due to pain.  Range of motion is normal in flexion and extension.  No midline spinous process tenderness.  Tenderness to palpation over trapezius and neck paraspinal musculature.  Shoulder range of motion is intact.  Strength is intact and symmetric in bilateral upper extremities.  Lymphadenopathy:    She has no cervical adenopathy.  Neurological: She is alert and oriented to person, place, and time. No cranial nerve deficit or sensory deficit. She exhibits normal muscle tone.  Skin: Skin is warm and dry. Capillary refill takes less than 2 seconds. No rash noted.  Psychiatric: She has a normal mood and affect. Her behavior is normal.  Vitals reviewed.       Assessment & Plan:   1. Muscle spasm 2. Neck pain on left side -Left-sided neck pain with significant paraspinal and trapezius muscle spasms - We will try high-dose baclofen and prednisone burst to decrease inflammation and tightness - Discussed use of ice, heat, Tylenol, stretching - Referral to physical therapy to help with learning home exercise program to continue to prevent this happening again in the  future -No signs of radicular pathology -Return precautions discussed - Ambulatory referral to Physical Therapy   Meds ordered this encounter  Medications  . Baclofen 5 MG TABS    Sig: Take 10 mg by mouth 3 (three) times daily as needed.    Dispense:  90 tablet    Refill:  1  . predniSONE (DELTASONE) 20 MG tablet    Sig: Take 2 tablets (40 mg total) by mouth daily with breakfast for 7 days.    Dispense:  14 tablet    Refill:  0     Return if symptoms worsen or fail to improve.   The entirety of the information documented in the History of Present Illness, Review of Systems and Physical Exam were personally obtained by me. Portions of this information were initially documented by Irving BurtonEmily Ratchford, CMA and reviewed by me for thoroughness and accuracy.    Erasmo DownerBacigalupo, Kaeya Schiffer M, MD, MPH  [facility] 12/18/2017 10:54 AM

## 2017-12-18 NOTE — Patient Instructions (Signed)

## 2017-12-19 DIAGNOSIS — H43811 Vitreous degeneration, right eye: Secondary | ICD-10-CM | POA: Diagnosis not present

## 2017-12-26 ENCOUNTER — Other Ambulatory Visit: Payer: Self-pay | Admitting: Family Medicine

## 2017-12-26 DIAGNOSIS — M62838 Other muscle spasm: Secondary | ICD-10-CM | POA: Diagnosis not present

## 2017-12-26 DIAGNOSIS — M542 Cervicalgia: Secondary | ICD-10-CM | POA: Diagnosis not present

## 2017-12-26 DIAGNOSIS — E1159 Type 2 diabetes mellitus with other circulatory complications: Secondary | ICD-10-CM

## 2017-12-26 MED ORDER — GLUCOSE BLOOD VI STRP: ORAL_STRIP | 12 refills | Status: DC

## 2017-12-26 NOTE — Telephone Encounter (Signed)
Refill sent  Erasmo DownerBacigalupo, Wiliam Cauthorn M, MD, MPH [facility] 12/26/2017 9:43 AM

## 2017-12-26 NOTE — Telephone Encounter (Signed)
Left patient a message advising her that refill has been sent to pharmacy

## 2017-12-26 NOTE — Telephone Encounter (Signed)
Pt contacted office for refill request on the following medications:  Free Style Testing Strips  [facility]  Please advise. Thanks TNP

## 2017-12-29 DIAGNOSIS — M542 Cervicalgia: Secondary | ICD-10-CM | POA: Diagnosis not present

## 2017-12-29 DIAGNOSIS — M62838 Other muscle spasm: Secondary | ICD-10-CM | POA: Diagnosis not present

## 2018-01-04 ENCOUNTER — Telehealth: Payer: Self-pay | Admitting: *Deleted

## 2018-01-04 DIAGNOSIS — H43811 Vitreous degeneration, right eye: Secondary | ICD-10-CM | POA: Diagnosis not present

## 2018-01-04 MED ORDER — NYSTATIN 100000 UNIT/ML MT SUSP: 5.0000 mL | Freq: Four times a day (QID) | OROMUCOSAL | 0 refills | Status: DC

## 2018-01-04 NOTE — Telephone Encounter (Signed)
Agree that it sounds like thrush.  Sent in Nystatin mouth wash to swish and swallow QID until resolution of symptoms.  If not improving with this, needs appt to be evaluated.  Erasmo Downer, MD, MPH [facility] 01/04/2018 2:50 PM

## 2018-01-04 NOTE — Telephone Encounter (Signed)
Patient advised. She verbalized understanding.  

## 2018-01-04 NOTE — Telephone Encounter (Signed)
Patient was seen in office 12/18/2017 and treated for neck pain. Patient stated she was prescribed baclofen and prednisone. Patient stated yesterday her mouth and tongue was sore. This morning patient stated she noticed white stuff on her tongue and redness in the corners of her mouth. Patient thinks she has thrush or yeast infection in mouth. Patient wanted to know if she can be treated with a medication over the phone or if she needs to be seen? Please advise?

## 2018-01-22 ENCOUNTER — Ambulatory Visit (INDEPENDENT_AMBULATORY_CARE_PROVIDER_SITE_OTHER): Payer: PPO | Admitting: Family Medicine

## 2018-01-22 ENCOUNTER — Encounter: Payer: Self-pay | Admitting: Family Medicine

## 2018-01-22 VITALS — BP 138/62 | HR 78 | Temp 98.0°F | Wt 140.6 lb

## 2018-01-22 DIAGNOSIS — M7742 Metatarsalgia, left foot: Secondary | ICD-10-CM

## 2018-01-22 NOTE — Patient Instructions (Addendum)
Metatarsal pads  Morton Neuralgia Morton neuralgia is a type of foot pain in the area closest to your toes. This area is sometimes called the ball of your foot. Morton neuralgia occurs when a branch of a nerve in your foot (digital nerve) becomes compressed. When this happens over a long period of time, the nerve can thicken (neuroma) and cause pain. This usually occurs between the third and fourth toe. Morton neuralgia can come and go but may get worse over time. What are the causes? Your digital nerve can become compressed and stretched at a point where it passes under a thick band of tissue that connects your toes (intermetatarsal ligament). Morton neuralgia can be caused by mild repetitive damage in this area. This type of damage can result from:  Activities such as running or jumping.  Wearing shoes that are too tight.  What increases the risk? You may be at risk for Morton neuralgia if you:  Are female.  Wear high heels.  Wear shoes that are narrow or tight.  Participate in activities that stretch your toes. These include: ? Running. ? Ballet. ? Long-distance walking.  What are the signs or symptoms? The first symptom of Morton neuralgia is pain that spreads from the ball of your foot to your toes. It may feel like you are walking on a marble. Pain usually gets worse with walking and goes away at night. Other symptoms may include numbness and cramping of your toes. How is this diagnosed? Your health care provider will do a physical exam. When doing the exam, your health care provider may:  Squeeze your foot just behind your toe.  Ask you to move your toes to check for pain.  You may also have tests on your foot to confirm the diagnosis. These may include:  An X-ray.  An MRI.  How is this treated? Treatment for Morton neuralgia may be as simple as changing the kind of shoes you wear. Other treatments may include:  Wearing a supportive pad (orthosis) under the front of  your foot. This lifts your toe bones and takes pressure off the nerve.  Getting injections of numbing medicine and anti-inflammatory medicine (steroid) in the nerve.  Having surgery to remove part of the thickened nerve.  Follow these instructions at home:  Take medicine only as directed by your health care provider.  Wear soft-soled shoes with a wide toe area.  Stop activities that may be causing pain.  Elevate your foot when resting.  Massage your foot.  Apply ice to the injured area: ? Put ice in a plastic bag. ? Place a towel between your skin and the bag. ? Leave the ice on for 20 minutes, 2-3 times a day.  Keep all follow-up visits as directed by your health care provider. This is important. Contact a health care provider if:  Home care instructions are not helping you get better.  Your symptoms change or get worse. This information is not intended to replace advice given to you by your health care provider. Make sure you discuss any questions you have with your health care provider. Document Released: 07/25/2000 Document Revised: 09/24/2015 Document Reviewed: 06/19/2013 Elsevier Interactive Patient Education  [employer]2018 [employer]vier Inc[employer]

## 2018-01-22 NOTE — Progress Notes (Signed)
Patient: Jessica Wheeler Female    DOB: Aug 12, 1940   77 y.o.   MRN: [id number] Visit Date: 01/22/2018  Today's Provider: Shirlee LatchAngela Bacigalupo, MD   Chief Complaint  Patient presents with  . Foot Pain   Subjective:    I, Presley RaddleNikki Walston, CMA, am acting as a Neurosurgeonscribe for Shirlee LatchAngela Bacigalupo, MD.   Leg Pain   Incident onset: a couple weeks ago. There was no injury mechanism. Pain location: Ball of left foot. Quality: soreness. The pain is moderate. The pain has been fluctuating since onset. She reports no foreign bodies present. Nothing aggravates the symptoms. Treatments tried: changed socks and shoes. The treatment provided no relief.  She denies any pain at night.  She is walking 2 miles daily.  This has not changed.  Fatigue is so much better since starting Vit D supplement    Allergies  Allergen Reactions  . Beta Adrenergic Blockers     Chest pressure/difficulty breathing  . Levaquin [Levofloxacin In D5w]     Low blood sugar; does take this when she needs it. She just makes sure to control her BS to avoid hypoglycemia.  . Lisinopril Cough  . Prevnar [Pneumococcal 13-Val Conj Vacc] Rash    Joint Pain  . Sulfa Antibiotics Rash    "makes my skin raw"     Current Outpatient Medications:  .  amiloride-hydrochlorothiazide (MODURETIC) 5-50 MG tablet, TAKE 1/2 TABLET BY MOUTH DAILY AS NEEDED FOR SWELLING, Disp: 45 tablet, Rfl: 2 .  amLODipine (NORVASC) 5 MG tablet, TAKE 1 TABLET(5 MG) BY MOUTH DAILY, Disp: 90 tablet, Rfl: 2 .  aspirin 81 MG EC tablet, Take 81 mg by mouth daily.  , Disp: , Rfl:  .  Baclofen 5 MG TABS, Take 10 mg by mouth 3 (three) times daily as needed., Disp: 90 tablet, Rfl: 1 .  Cyanocobalamin (VITAMIN B-12) 1000 MCG/15ML LIQD, Take 2,500 mcg by mouth daily. , Disp: , Rfl:  .  glucose blood (FREESTYLE TEST STRIPS) test strip, Use as instructed, Disp: 100 each, Rfl: 12 .  Ibuprofen (ADVIL) 200 MG CAPS, Take 1 capsule by mouth daily.  , Disp: , Rfl:  .  latanoprost  (XALATAN) 0.005 % ophthalmic solution, Reported on 10/12/2015 - both eyes nightly, Disp: , Rfl: 3 .  losartan (COZAAR) 100 MG tablet, TAKE 1 TABLET BY MOUTH EVERY DAY, Disp: 90 tablet, Rfl: 2 .  MAGNESIUM PO, Take 250 mg by mouth daily., Disp: , Rfl:  .  metFORMIN (GLUCOPHAGE) 500 MG tablet, TAKE 2 TABLETS(1000 MG) BY MOUTH TWICE DAILY, Disp: 360 tablet, Rfl: 0 .  nystatin (MYCOSTATIN) 100000 UNIT/ML suspension, Take 5 mLs (500,000 Units total) by mouth 4 (four) times daily. Swish and swallow, Disp: 300 mL, Rfl: 0  Review of Systems  Constitutional: Negative.   Respiratory: Negative.   Cardiovascular: Negative.   Musculoskeletal: Negative.        Left heel pain     Social History   Tobacco Use  . Smoking status: Former Smoker    Packs/day: 1.00    Years: 15.00    Pack years: 15.00    Types: Cigarettes    Last attempt to quit: 08/03/2003    Years since quitting: 14.4  . Smokeless tobacco: Never Used  Substance Use Topics  . Alcohol use: No   Objective:   BP 138/62 (BP Location: Right Arm, Patient Position: Sitting, Cuff Size: Normal)   Pulse 78   Temp 98 F (36.7 C) (Oral)  Wt 140 lb 9.6 oz (63.8 kg)   SpO2 98%   BMI 24.91 kg/m  Vitals:   01/22/18 0809  BP: 138/62  Pulse: 78  Temp: 98 F (36.7 C)  TempSrc: Oral  SpO2: 98%  Weight: 140 lb 9.6 oz (63.8 kg)     Physical Exam  Constitutional: She is oriented to person, place, and time. She appears well-developed and well-nourished. No distress.  HENT:  Head: Normocephalic and atraumatic.  Eyes: Conjunctivae are normal. No scleral icterus.  Cardiovascular: Normal rate, regular rhythm, normal heart sounds and intact distal pulses.  No murmur heard. Pulmonary/Chest: Effort normal and breath sounds normal. No respiratory distress. She has no wheezes. She has no rales.  Musculoskeletal:  L Foot: Longitudinal arch intact.  No tenderness palpation along plantar fascia.  Loss of transverse arch.  Tenderness to palpation at  the base of the third metatarsal.  Neurological: She is alert and oriented to person, place, and time.  Skin: Skin is warm and dry. Capillary refill takes less than 2 seconds. No rash noted.  Psychiatric: She has a normal mood and affect. Her behavior is normal.  Vitals reviewed.       Assessment & Plan:   1. Metatarsalgia, left foot -New problem - Likely related to loss of transverse arch and loss of metatarsal fat pad -Also likely related to multiple previous traumas to this area -Discussed getting shoe orthotics with metatarsal pads -Can use NSAIDs and ice as needed -Discussed augmentation of exercise as needed -Could consider physical therapy if needed in the future -Could consider podiatry referral if needed in the future    Return in about 6 months (around 07/23/2018), or if symptoms worsen or fail to improve, for AWV/CPE (different days after 2/28).   The entirety of the information documented in the History of Present Illness, Review of Systems and Physical Exam were personally obtained by me. Portions of this information were initially documented by [name], CMA and reviewed by me for thoroughness and accuracy.    Erasmo Downer, MD, MPH [facility] 01/22/2018 12:54 PM

## 2018-02-19 ENCOUNTER — Other Ambulatory Visit: Payer: Self-pay

## 2018-02-19 DIAGNOSIS — Z1231 Encounter for screening mammogram for malignant neoplasm of breast: Secondary | ICD-10-CM

## 2018-02-22 ENCOUNTER — Other Ambulatory Visit: Payer: Self-pay | Admitting: Family Medicine

## 2018-02-22 DIAGNOSIS — E1169 Type 2 diabetes mellitus with other specified complication: Secondary | ICD-10-CM

## 2018-04-26 ENCOUNTER — Telehealth: Payer: Self-pay | Admitting: *Deleted

## 2018-04-26 NOTE — Telephone Encounter (Signed)
FYI - please review

## 2018-04-26 NOTE — Telephone Encounter (Signed)
Patient called office stating her right ankle has been swollen for several days. Patient states yesterday she noticed a red puffy sore on right ankle and red mark on the back of her ankle and leg. Patient states she has no pain or heat. Patient was given an appt for tomorrow at 12:20 pm with Toni ArthursBob Chauvin. Advised patient if she has pain or swelling/redness worsens, go to ER or Urgent Care.

## 2018-04-27 ENCOUNTER — Encounter: Payer: Self-pay | Admitting: Family Medicine

## 2018-04-27 ENCOUNTER — Ambulatory Visit
Admission: RE | Admit: 2018-04-27 | Discharge: 2018-04-27 | Disposition: A | Discharge status: Home / Self Care | Payer: PPO | Source: Ambulatory Visit | Attending: General Surgery | Admitting: General Surgery

## 2018-04-27 ENCOUNTER — Ambulatory Visit (INDEPENDENT_AMBULATORY_CARE_PROVIDER_SITE_OTHER): Payer: PPO | Admitting: Family Medicine

## 2018-04-27 ENCOUNTER — Other Ambulatory Visit: Payer: Self-pay

## 2018-04-27 ENCOUNTER — Ambulatory Visit: Payer: Self-pay | Admitting: Family Medicine

## 2018-04-27 VITALS — BP 110/50 | HR 80 | Temp 97.6°F | Ht 63.5 in | Wt 143.8 lb

## 2018-04-27 DIAGNOSIS — Z1231 Encounter for screening mammogram for malignant neoplasm of breast: Secondary | ICD-10-CM | POA: Diagnosis not present

## 2018-04-27 DIAGNOSIS — I872 Venous insufficiency (chronic) (peripheral): Secondary | ICD-10-CM

## 2018-04-27 NOTE — Progress Notes (Signed)
  Subjective:     Patient ID: Jessica Wheeler, female   DOB: [birth year], 77 y.o.   MRN: [id number] Chief Complaint  Patient presents with  . Leg Swelling    and ankle.  redness arouns back of ankle and goes up in leg.  noticed on 04/24/18.  no recall of an injury.  but pt has issues with metatorsal bones in the left foot   HPI States she was standing for a long period of time cooking Monday 12/23 when she noticed her left ankle was swelling with an area above her sock that was "fiery" red. She has elevated her leg this week with resolution of swelling and near resolution of the redness. No pruritis.  Review of Systems     Objective:   Physical Exam Constitutional:      General: She is not in acute distress.    Appearance: She is not ill-appearing.  Cardiovascular:     Pulses:          Dorsalis pedis pulses are 2+ on the left side.       Posterior tibial pulses are 2+ on the left side.  Skin:    Comments: Small area of mild erythema on her medial ankle. Non-tender without pitting or tenderness. No indurated vein appreciated.  Neurological:     Mental Status: She is alert.        Assessment:    1. Venous stasis dermatitis of left lower extremity     Plan:    Continue leg elevation to rash resolution. Consider pressure stockings if recurrent.

## 2018-04-27 NOTE — Patient Instructions (Signed)
Discussed continue leg elevation.

## 2018-05-08 ENCOUNTER — Ambulatory Visit: Payer: Self-pay | Admitting: General Surgery

## 2018-05-08 ENCOUNTER — Other Ambulatory Visit: Payer: Self-pay | Admitting: Family Medicine

## 2018-05-08 DIAGNOSIS — I10 Essential (primary) hypertension: Secondary | ICD-10-CM

## 2018-05-08 DIAGNOSIS — E1169 Type 2 diabetes mellitus with other specified complication: Secondary | ICD-10-CM

## 2018-05-08 DIAGNOSIS — E1159 Type 2 diabetes mellitus with other circulatory complications: Secondary | ICD-10-CM

## 2018-05-08 NOTE — Telephone Encounter (Signed)
Pt has an new pharmacy.  The following will need to be called into:   amiloride-hydrochlorothiazide (MODURETIC) 5-50 MG tablet amLODipine (NORVASC) 5 MG tablet Baclofen 5 MG TABS glucose blood (FREESTYLE TEST STRIPS) test strip losartan (COZAAR) 100 MG tablet metFORMIN (GLUCOPHAGE) 500 MG tablet  nystatin (MYCOSTATIN) 100000 UNIT/ML suspension   [facility] [address] ([address]), [place] - [address] [phone] (Phone) [phone] (Fax)    Thanks, [employer]

## 2018-05-09 MED ORDER — METFORMIN HCL 500 MG PO TABS: ORAL_TABLET | ORAL | 3 refills | Status: DC

## 2018-05-09 MED ORDER — NYSTATIN 100000 UNIT/ML MT SUSP: 5.0000 mL | Freq: Four times a day (QID) | OROMUCOSAL | 0 refills | Status: DC

## 2018-05-09 MED ORDER — LOSARTAN POTASSIUM 100 MG PO TABS: 100.0000 mg | ORAL_TABLET | Freq: Every day | ORAL | 2 refills | Status: DC

## 2018-05-09 MED ORDER — AMLODIPINE BESYLATE 5 MG PO TABS: ORAL_TABLET | ORAL | 2 refills | Status: DC

## 2018-05-09 MED ORDER — AMILORIDE-HYDROCHLOROTHIAZIDE 5-50 MG PO TABS: ORAL_TABLET | ORAL | 2 refills | Status: DC

## 2018-05-09 MED ORDER — GLUCOSE BLOOD VI STRP: ORAL_STRIP | 12 refills | Status: DC

## 2018-05-09 MED ORDER — BACLOFEN 5 MG PO TABS: 10.0000 mg | ORAL_TABLET | Freq: Three times a day (TID) | ORAL | 1 refills | Status: DC | PRN

## 2018-05-17 DIAGNOSIS — H6123 Impacted cerumen, bilateral: Secondary | ICD-10-CM | POA: Diagnosis not present

## 2018-05-17 DIAGNOSIS — J301 Allergic rhinitis due to pollen: Secondary | ICD-10-CM | POA: Diagnosis not present

## 2018-05-17 DIAGNOSIS — H60393 Other infective otitis externa, bilateral: Secondary | ICD-10-CM | POA: Diagnosis not present

## 2018-05-23 ENCOUNTER — Telehealth: Payer: Self-pay | Admitting: Family Medicine

## 2018-05-23 NOTE — Telephone Encounter (Signed)
Verbal order was given. 

## 2018-05-23 NOTE — Telephone Encounter (Signed)
[facility] w/ [facility] was formally [employer] [phone]  Needing verbal approval for requested diabetic supplies from pt.  Please advise.  Thanks, [employer]

## 2018-05-23 NOTE — Telephone Encounter (Signed)
OK for verbals 

## 2018-05-24 DIAGNOSIS — E119 Type 2 diabetes mellitus without complications: Secondary | ICD-10-CM | POA: Diagnosis not present

## 2018-06-03 NOTE — Progress Notes (Signed)
Cardiology Office Note  Date:  06/04/2018   ID:  [name], DOB 12/18/1940, MRN [id number]  PCP:  Erasmo DownerBacigalupo, Angela M, MD   Chief Complaint  Patient presents with  . other    12 month follow up. Meds reviewed by the pt. verbally. "doing well."     HPI:  [name] is a very pleasant 78 year old woman with a history of  diabetes, HBa1C 6.7 hypertension,  smoking,  aortic dissection following catheterization,   renal artery stenosis followed by Dr. Wyn Quakerew.  Chronically low sodium.  Cardiac catheterization in 2007  showed 20% proximal RCA disease and 20% LAD disease.She has a short region of dissection in the distal aorta, seen on CT scan in 2010 On U/S in 2013: no dissection aortoiliac atherosclerosis, followed by Dr. Wyn Quakerew who presents for routine followup of her coronary artery disease.   Takes amiloride HCTZ 1/2 pill daily, sodium chronically running low Does not eat out Sodium 132 last year Sugars high, Regular exercise  We have reviewed lower extremity arterial studies from Dr. Wyn Quakerew  Noninvasive studies show mildly elevated velocities in the common iliac arteries bilaterally although this does not appear to be significantly high-grade.  Her aorta is not aneurysmal.  Denies any chest pain concerning for angina, no significant shortness of breath   hemoglobin A1c in the 6.7  Previously with Intolerance to imdur, ziac  feels terrible on beta blockers  chronic Bladder problem Previously with left knee pain, chronic issue, now better  Xray of her knee, osteoarthritis, Baker's cyst, proved on NSAIDs  EKG personally reviewed by myself on todays visit Shows normal sinus rhythm with rate 72 bpm no significant ST or T wave changes  Other past medical history reviewed severe reaction to pneumonia shot last year in 2015, still with hand weakness, joint problems. She had a rash initially that has resolved  Prior history of shingles  Blood pressure has been up and down but  in general within a reasonable range, sugars also climbing slightly. Regular exercise program every day.  Previous  Echocardiogram was performed that was normal  Stress test/Myoview was also normal showing no ischemia .   Previous  Lab work shows normal TSH, sodium 132, hemoglobin A1c 6.2  PMH:   has a past medical history of Allergy, Arthritis, Bundle branch block, right, Diabetes mellitus, Diffuse cystic mastopathy, Dissection, aorta (HCC), Diverticulosis, Family history of malignant neoplasm of gastrointestinal tract, Glaucoma (2005), Heart murmur, History of bronchitis, History of pancreatitis (2005), Hypertension (1982), Personal history of tobacco use, presenting hazards to health, RBBB, and Ulcer.  PSH:    Past Surgical History:  Procedure Laterality Date  . ABDOMINAL HYSTERECTOMY  1991  . APPENDECTOMY    . BACK SURGERY  1982   for lumbar ruptured disc. No hardware, no discectomy, no fusion  . BLADDER SURGERY    . BREAST BIOPSY Right 1980's   benign  . BREAST EXCISIONAL BIOPSY Right 1980s   benign  . BREAST SURGERY     biopsy  . CARDIAC CATHETERIZATION     Dr. Lady GaryFath, few years ago  . CATARACT EXTRACTION W/PHACO Left 08/31/2015   Procedure: CATARACT EXTRACTION PHACO AND INTRAOCULAR LENS PLACEMENT (IOC);  Surgeon: Sherald HessAnita Prakash Vin-Parikh, MD;  [facility];  Service: Ophthalmology;  Laterality: Left;  DIABETIC - oral meds  . CATARACT EXTRACTION W/PHACO Right 10/12/2015   Procedure: CATARACT EXTRACTION PHACO AND INTRAOCULAR LENS PLACEMENT (IOC) right eye;  Surgeon: Sherald HessAnita Prakash Vin-Parikh, MD;  [facility];  Service: Ophthalmology;  Laterality: Right;  DIABETIC - oral meds  . CHOLECYSTECTOMY  1994  . COLONOSCOPY  2008,02/22/2012   Dr. Sankar-2013  . COLONOSCOPY WITH PROPOFOL N/A 04/04/2017   Procedure: COLONOSCOPY WITH PROPOFOL;  Surgeon: Kieth BrightlySankar, Seeplaputhur G, MD;  Location: [facility];  Service: Endoscopy;  Laterality: N/A;  . REFRACTIVE  SURGERY      Current Outpatient Medications  Medication Sig Dispense Refill  . amiloride-hydrochlorothiazide (MODURETIC) 5-50 MG tablet TAKE 1/2 TABLET BY MOUTH DAILY AS NEEDED FOR SWELLING 45 tablet 2  . amLODipine (NORVASC) 5 MG tablet TAKE 1 TABLET(5 MG) BY MOUTH DAILY 90 tablet 2  . aspirin 81 MG EC tablet Take 81 mg by mouth daily.      . Baclofen 5 MG TABS Take 10 mg by mouth 3 (three) times daily as needed. 90 tablet 1  . Cyanocobalamin (VITAMIN B-12) 1000 MCG/15ML LIQD Take 2,500 mcg by mouth daily.     Marland Kitchen. glucose blood (FREESTYLE TEST STRIPS) test strip Use as instructed 100 each 12  . Ibuprofen (ADVIL) 200 MG CAPS Take 1 capsule by mouth daily.      Marland Kitchen. latanoprost (XALATAN) 0.005 % ophthalmic solution Reported on 10/12/2015 - both eyes nightly  3  . losartan (COZAAR) 100 MG tablet Take 1 tablet (100 mg total) by mouth daily. 90 tablet 2  . MAGNESIUM PO Take 250 mg by mouth daily.    . metFORMIN (GLUCOPHAGE) 500 MG tablet TAKE 2 TABLETS(1000 MG) BY MOUTH TWICE DAILY WITH MEALS 360 tablet 3   No current facility-administered medications for this visit.      Allergies:   Beta adrenergic blockers; Levaquin [levofloxacin in d5w]; Levofloxacin; Lisinopril; Prevnar [pneumococcal 13-val conj vacc]; and Sulfa antibiotics   Social History:  The patient  reports that she quit smoking about 14 years ago. Her smoking use included cigarettes. She has a 15.00 pack-year smoking history. She has never used smokeless tobacco. She reports that she does not drink alcohol or use drugs.   Family History:   family history includes Anxiety disorder in her mother; Breast cancer (age of onset: 7750) in her cousin; Breast cancer (age of onset: 3960) in her maternal aunt; Colon cancer in her brother; Diabetes in her mother; Heart attack in her father; Heart disease in her father; Hypertension in her maternal grandfather, maternal grandmother, and mother; Rheum arthritis in her father.    Review of  Systems: Review of Systems  Constitutional: Negative.   Respiratory: Negative.   Cardiovascular: Negative.   Gastrointestinal: Negative.   Musculoskeletal: Positive for joint pain.  Neurological: Negative.   Psychiatric/Behavioral: Negative.   All other systems reviewed and are negative.   PHYSICAL EXAM: VS:  BP 130/60 (BP Location: Left Arm, Patient Position: Sitting, Cuff Size: Normal)   Pulse 72   Ht 5\' 3"  (1.6 m)   Wt 142 lb 8 oz (64.6 kg)   BMI 25.24 kg/m  , BMI Body mass index is 25.24 kg/m. Constitutional:  oriented to person, place, and time. No distress.  HENT:  Head: Grossly normal Eyes:  no discharge. No scleral icterus.  Neck: No JVD, no carotid bruits  Cardiovascular: Regular rate and rhythm, no murmurs appreciated Pulmonary/Chest: Clear to auscultation bilaterally, no wheezes or rails Abdominal: Soft.  no distension.  no tenderness.  Musculoskeletal: Normal range of motion Neurological:  normal muscle tone. Coordination normal. No atrophy Skin: Skin warm and dry Psychiatric: normal affect, pleasant  Recent Labs: 12/11/2017: ALT 32; BUN 15; Creatinine, Ser 0.72; Hemoglobin  13.5; Platelets 270; Potassium 4.4; Sodium 132; TSH 3.880    Lipid Panel Lab Results  Component Value Date   CHOL 181 03/08/2017   HDL 77 (A) 03/08/2017   LDLCALC 92 03/08/2017   TRIG 62 03/08/2017    Wt Readings from Last 3 Encounters:  06/04/18 142 lb 8 oz (64.6 kg)  04/27/18 143 lb 12.8 oz (65.2 kg)  01/22/18 140 lb 9.6 oz (63.8 kg)     ASSESSMENT AND PLAN:  Coronary artery disease involving native coronary artery of native heart without angina pectoris -  Denies any anginal symptoms, no further work-up needed at this time Stressed importance of low sugars, low-cholesterol She is a non-smoker  Essential hypertension -  Long discussion concerning her medications Feels relatively well on her current regiment Though she does report pills in general always make her feel  bad Takes both her medications in the evening typically with dinner Happy to stay on her current medications  Pure hypercholesterolemia Currently not on a statin We did discuss possibly starting Zetia 10 mg daily for LDL greater than 70 She has repeat lab work pending in 2 months time  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HCC) Not seen on ultrasound No further work-up needed at this time  Type 2 diabetes mellitus with other specified complication, without long-term current use of insulin (HCC) She is exercising, watching her diet, weight is stable Recommended low carbohydrate diet  Primary osteoarthritis of left knee Osteoarthritis, on meloxicam Symptoms better, felt to be from a Baker's cyst  Disposition:   F/U  12 months   Total encounter time more than 25 minutes  Greater than 50% was spent in counseling and coordination of care with the patient    Orders Placed This Encounter  Procedures  . EKG 12-Lead     Signed, Dossie Arbour, M.D., Ph.D. 06/04/2018  [facility] [place], [place] [phone]

## 2018-06-04 ENCOUNTER — Encounter: Payer: Self-pay | Admitting: Cardiovascular Disease

## 2018-06-04 ENCOUNTER — Ambulatory Visit: Payer: PPO | Admitting: Cardiovascular Disease

## 2018-06-04 VITALS — BP 130/60 | HR 72 | Ht 63.0 in | Wt 142.5 lb

## 2018-06-04 DIAGNOSIS — E782 Mixed hyperlipidemia: Secondary | ICD-10-CM | POA: Diagnosis not present

## 2018-06-04 DIAGNOSIS — I701 Atherosclerosis of renal artery: Secondary | ICD-10-CM

## 2018-06-04 DIAGNOSIS — I25118 Atherosclerotic heart disease of native coronary artery with other forms of angina pectoris: Secondary | ICD-10-CM

## 2018-06-04 DIAGNOSIS — I7102 Dissection of abdominal aorta: Secondary | ICD-10-CM | POA: Diagnosis not present

## 2018-06-04 DIAGNOSIS — I10 Essential (primary) hypertension: Secondary | ICD-10-CM

## 2018-06-04 DIAGNOSIS — E7800 Pure hypercholesterolemia, unspecified: Secondary | ICD-10-CM

## 2018-06-04 DIAGNOSIS — E1159 Type 2 diabetes mellitus with other circulatory complications: Secondary | ICD-10-CM

## 2018-06-04 DIAGNOSIS — E871 Hypo-osmolality and hyponatremia: Secondary | ICD-10-CM

## 2018-06-04 NOTE — Patient Instructions (Signed)
If LDL is >70, Consider starting zetia one a day 10 mg   Medication Instructions:  No changes  If you need a refill on your cardiac medications before your next appointment, please call your pharmacy.    Lab work: No new labs needed   If you have labs (blood work) drawn today and your tests are completely normal, you will receive your results only by: Marland Kitchen MyChart Message (if you have MyChart) OR . A paper copy in the mail If you have any lab test that is abnormal or we need to change your treatment, we will call you to review the results.   Testing/Procedures: No new testing needed   Follow-Up: At [facility], you and your health needs are our priority.  As part of our continuing mission to provide you with exceptional heart care, we have created designated Provider Care Teams.  These Care Teams include your primary Cardiologist (physician) and Advanced Practice Providers (APPs -  Physician Assistants and Nurse Practitioners) who all work together to provide you with the care you need, when you need it.  . You will need a follow up appointment in 12 months .   Please call our office 2 months in advance to schedule this appointment.    . Providers on your designated Care Team:   . Nicolasa Ducking, NP . Eula Listen, PA-C . Marisue Ivan, PA-C  Any Other Special Instructions Will Be Listed Below (If Applicable).  For educational health videos Log in to : [email or web address] Or : [email or web address], password : [email or web address]

## 2018-06-05 ENCOUNTER — Ambulatory Visit: Payer: Self-pay | Admitting: Surgery

## 2018-06-05 ENCOUNTER — Ambulatory Visit: Payer: Self-pay | Admitting: General Surgery

## 2018-06-18 ENCOUNTER — Other Ambulatory Visit: Payer: Self-pay

## 2018-06-18 ENCOUNTER — Ambulatory Visit: Payer: PPO | Admitting: General Surgery

## 2018-06-18 ENCOUNTER — Encounter: Payer: Self-pay | Admitting: General Surgery

## 2018-06-18 VITALS — BP 162/77 | HR 81 | Temp 97.7°F | Ht 63.0 in | Wt 143.4 lb

## 2018-06-18 DIAGNOSIS — N6012 Diffuse cystic mastopathy of left breast: Secondary | ICD-10-CM

## 2018-06-18 DIAGNOSIS — N6011 Diffuse cystic mastopathy of right breast: Secondary | ICD-10-CM

## 2018-06-18 NOTE — Progress Notes (Signed)
Patient ID: Jessica Wheeler, female   DOB: [birth year], 78 y.o.   MRN: [id number]  Chief Complaint  Patient presents with  . Follow-up     1 yr f/u rec bil screen mammo [facility] 04/27/18    HPI Jessica Wheeler is a 78 y.o. female.   She is a former patient of Dr. Luan Moore.  He followed her for fibrocystic breast disease.  He performed open biopsy of what sounds like a fibroadenoma in the past.  He also performed her colonoscopies.  She is here today for routine breast follow-up.  She had a mammogram in December 2019 that was normal.  She denies any changes in her breasts since her last breast exam.  She does perform routine self exams.  She denies any lumps, pain, or nipple discharge.  She denies any additional changes in her health history since she was last seen.  She did ask about rectal prolapse and the surgery for this and what it might entail.  She states that she thinks she probably does not need surgical follow-up for her breast disease after today's visit.   Past Medical History:  Diagnosis Date  . Allergy   . Arthritis   . Bundle branch block, right   . Diabetes mellitus   . Diffuse cystic mastopathy   . Dissection, aorta (HCC)    Dr.Dew follows  . Diverticulosis   . Family history of malignant neoplasm of gastrointestinal tract   . Glaucoma 2005  . Heart murmur   . History of bronchitis   . History of pancreatitis 2005  . Hypertension 1982  . Personal history of tobacco use, presenting hazards to health   . RBBB   . Ulcer     Past Surgical History:  Procedure Laterality Date  . ABDOMINAL HYSTERECTOMY  1991  . APPENDECTOMY    . BACK SURGERY  1982   for lumbar ruptured disc. No hardware, no discectomy, no fusion  . BLADDER SURGERY    . BREAST BIOPSY Right 1980's   benign  . BREAST EXCISIONAL BIOPSY Right 1980s   benign  . BREAST SURGERY     biopsy  . CARDIAC CATHETERIZATION     Dr. Lady Gary, few years ago  . CATARACT EXTRACTION W/PHACO Left 08/31/2015   Procedure:  CATARACT EXTRACTION PHACO AND INTRAOCULAR LENS PLACEMENT (IOC);  Surgeon: Sherald Hess, MD;  Location: [facility];  Service: Ophthalmology;  Laterality: Left;  DIABETIC - oral meds  . CATARACT EXTRACTION W/PHACO Right 10/12/2015   Procedure: CATARACT EXTRACTION PHACO AND INTRAOCULAR LENS PLACEMENT (IOC) right eye;  Surgeon: Sherald Hess, MD;  [facility];  Service: Ophthalmology;  Laterality: Right;  DIABETIC - oral meds  . CHOLECYSTECTOMY  1994  . COLONOSCOPY  2008,02/22/2012   Dr. Sankar-2013  . COLONOSCOPY WITH PROPOFOL N/A 04/04/2017   Procedure: COLONOSCOPY WITH PROPOFOL;  Surgeon: [name], MD;  Location: [facility];  Service: Endoscopy;  Laterality: N/A;  . REFRACTIVE SURGERY      Family History  Problem Relation Age of Onset  . Diabetes Mother   . Hypertension Mother   . Anxiety disorder Mother   . Heart disease Father        rheumatic heart disease  . Heart attack Father   . Rheum arthritis Father   . Colon cancer Brother   . Hypertension Maternal Grandmother   . Hypertension Maternal Grandfather   . Breast cancer Maternal Aunt 60  . Breast cancer Cousin 50  Social History Social History   Tobacco Use  . Smoking status: Former Smoker    Packs/day: 1.00    Years: 15.00    Pack years: 15.00    Types: Cigarettes    Last attempt to quit: 08/03/2003    Years since quitting: 14.8  . Smokeless tobacco: Never Used  Substance Use Topics  . Alcohol use: No  . Drug use: No    Allergies  Allergen Reactions  . Beta Adrenergic Blockers     Chest pressure/difficulty breathing  . Levaquin [Levofloxacin In D5w]     Low blood sugar; does take this when she needs it. She just makes sure to control her BS to avoid hypoglycemia.  . Levofloxacin   . Lisinopril Cough  . Prevnar [Pneumococcal 13-Val Conj Vacc] Rash    Joint Pain  . Sulfa Antibiotics Rash    "makes my skin raw"    Current Outpatient  Medications  Medication Sig Dispense Refill  . amiloride-hydrochlorothiazide (MODURETIC) 5-50 MG tablet TAKE 1/2 TABLET BY MOUTH DAILY AS NEEDED FOR SWELLING 45 tablet 2  . amLODipine (NORVASC) 5 MG tablet TAKE 1 TABLET(5 MG) BY MOUTH DAILY 90 tablet 2  . aspirin 81 MG EC tablet Take 81 mg by mouth daily.      . Baclofen 5 MG TABS Take 10 mg by mouth 3 (three) times daily as needed. 90 tablet 1  . Cyanocobalamin (VITAMIN B-12) 1000 MCG/15ML LIQD Take 2,500 mcg by mouth daily.     Marland Kitchen. glucose blood (FREESTYLE TEST STRIPS) test strip Use as instructed 100 each 12  . Ibuprofen (ADVIL) 200 MG CAPS Take 1 capsule by mouth daily.      Marland Kitchen. latanoprost (XALATAN) 0.005 % ophthalmic solution Reported on 10/12/2015 - both eyes nightly  3  . losartan (COZAAR) 100 MG tablet Take 1 tablet (100 mg total) by mouth daily. 90 tablet 2  . MAGNESIUM PO Take 250 mg by mouth daily.    . metFORMIN (GLUCOPHAGE) 500 MG tablet TAKE 2 TABLETS(1000 MG) BY MOUTH TWICE DAILY WITH MEALS 360 tablet 3   No current facility-administered medications for this visit.     Review of Systems Review of Systems  All other systems reviewed and are negative.   Blood pressure (!) 162/77, pulse 81, temperature 97.7 F (36.5 C), temperature source Temporal, height 5\' 3"  (1.6 m), weight 143 lb 6.4 oz (65 kg), SpO2 98 %.  Physical Exam Physical Exam Vitals signs reviewed.  Constitutional:      General: She is not in acute distress.    Appearance: Normal appearance.  HENT:     Head: Normocephalic and atraumatic.     Nose: No congestion or rhinorrhea.     Mouth/Throat:     Mouth: Mucous membranes are moist.     Pharynx: Oropharynx is clear.  Eyes:     General:        Right eye: No discharge.        Left eye: No discharge.     Conjunctiva/sclera: Conjunctivae normal.     Pupils: Pupils are equal, round, and reactive to light.  Neck:     Musculoskeletal: Normal range of motion.  Cardiovascular:     Rate and Rhythm: Normal rate  and regular rhythm.     Pulses: Normal pulses.  Pulmonary:     Effort: Pulmonary effort is normal.     Breath sounds: Normal breath sounds.  Chest:     Breasts: Breasts are symmetrical.  Right: Normal. No swelling, inverted nipple, mass, nipple discharge, skin change or tenderness.        Left: Normal. No swelling, inverted nipple, mass, nipple discharge, skin change or tenderness.       Comments: Surgical scar Abdominal:     General: Abdomen is flat. Bowel sounds are normal.     Palpations: Abdomen is soft.  Genitourinary:    Comments: deferred Musculoskeletal: Normal range of motion.        General: No swelling or tenderness.  Lymphadenopathy:     Cervical: No cervical adenopathy.     Upper Body:     Right upper body: No supraclavicular, axillary or pectoral adenopathy.     Left upper body: No supraclavicular, axillary or pectoral adenopathy.  Skin:    General: Skin is warm.  Neurological:     General: No focal deficit present.     Mental Status: She is alert.  Psychiatric:        Mood and Affect: Mood normal.        Behavior: Behavior normal.     Data Reviewed Mammogram from 04/2018; the study describes dense fibrocystic breast tissue without abnormalities concerning for malignancy.  Assessment    This is a 78 year old woman with a history of fibrocystic breast disease and several benign breast biopsies.  On physical examination, she has no concerning findings and her mammogram confirms this.  We discussed the need, or lack thereof, of ongoing surgical follow-up.  We also discussed the surgical procedure for treatment of rectal prolapse.  I discussed with her that I do not perform this operation and that if she wish to pursue it, I would recommend that she see a formal fellowship trained colon and rectal surgeon.  She states that the prolapse is minimal and that at this time, she does not wish to pursue aggressive intervention.    Plan    She should continue to  perform self exams.  She should have an annual mammogram.  She does not require ongoing surgical follow-up and expressed her desire to have her primary care provider, Dr. Beryle Flock, perform her exams from here on out.  We will see her on an as-needed basis.       Duanne Guess 06/18/2018, 10:22 AM

## 2018-06-18 NOTE — Patient Instructions (Addendum)
Patient is to return to the office as needed. Follow up with your PCP for your next mammogram in 1 year.   Call the office with any questions or concerns.    Rectal Prolapse, Adult Rectal prolapse happens when the inside of the last section of the large intestine (rectum) drops down into an abnormal position. There are two types of rectal prolapse:  Partial. In partial rectal prolapse, the inner lining of the rectum falls or sinks out of place and may stick out of the anus.  Complete. In complete rectal prolapse, all layers of the rectum fall or sink out of place and may stick out of the anus. At first, rectal prolapse may be temporary. It may happen only when you are having a bowel movement. Over time, the prolapse will likely get worse. It may start to happen more often and cause uncomfortable symptoms. Eventually, the prolapse may happen when you are walking or standing. Surgery is often needed for this condition. What are the causes? This condition may be caused by weakness in the muscles that attach the rectum to the inside of the lower abdomen. The exact cause of this muscle weakness is often not known. What increases the risk? You are more likely to develop this condition if you:  Have a history of chronic constipation or diarrhea.  Frequently strain to have a bowel movement.  Are a woman who is 24 years of age or older.  Have a neurological disorder, such as multiple sclerosis, or lower spinal cord injury.  Are a woman who has been pregnant many times.  Have had pelvic or rectal surgery.  Have cystic fibrosis. What are the signs or symptoms? The main symptom of this condition is a red mass of tissue sticking out of your anus. The mass may appear inflamed, have mucus, or bleed slightly.  At first, the mass may only appear after a bowel movement.  The mass may then start to appear more often. Other symptoms may include:  Discomfort in the anus and  rectum.  Constipation.  Feeling that stool is not completely emptied from the rectum.  Diarrhea.  Leaking of stool, mucus, or blood from the anus (fecal incontinence).  Feeling like you are sitting on a ball. How is this diagnosed? This condition may be diagnosed based on your symptoms and a physical exam.  During the exam, you may be asked to squat and strain as though you are having a bowel movement.  You may also have tests, such as: ? A rectal exam using a flexible scope (sigmoidoscopy or colonoscopy). ? A procedure that involves taking X-rays of your rectum after a dye (contrast material) is injected into the rectum (defecogram). How is this treated? This condition is usually treated with surgery to repair the weakened muscles and to reconnect the rectum to attachments inside the lower abdomen. Other treatment options may include:  Pushing the prolapsed area back into the rectum (reduction). ? Your health care provider may do this by gently pushing it back in using a moist cloth. ? The health care provider may show you how to do this at home if the prolapse occurs again.  Medicines to prevent constipation and straining. This may include laxatives or stool softeners. Follow these instructions at home: General instructions   Take over-the-counter and prescription medicines only as told by your health care provider.  Do not strain to have a bowel movement.  Do not lift anything that is heavier than 10 lb (4.5 kg),  or the limit that you are told, until your health care provider says that it is safe.  Follow instructions from your health care provider about what to do if the prolapse occurs again and does not go back in. This may involve lying on your side and using a moist cloth to gently press the lump into your rectum.  Keep all follow-up visits as told by your health care provider. This is important. Preventing constipation To prevent or treat constipation, your health  care provider may recommend that you:  Drink enough fluid to keep your urine pale yellow.  Take over-the-counter or prescription medicines.  Eat foods that are high in fiber, such as beans, whole grains, and fresh fruits and vegetables.  Limit foods that are high in fat and processed sugars, such as fried or sweet foods.  Contact a health care provider if:  You have a fever.  Your prolapse cannot be reduced at home.  You have constipation or diarrhea.  You have mild rectal bleeding. Get help right away if:  You have very bad rectal pain.  You bleed heavily from your rectum. Summary  Rectal prolapse happens when the inside of the rectum drops down into an abnormal position. In some cases, the rectum may partially or completely push out through the anal opening.  This condition is usually treated with surgery to repair the weakened muscles and to reconnect the rectum to attachments inside the lower abdomen.  Take over-the-counter and prescription medicines only as told by your health care provider.  Follow instructions from your health care provider about what to do if the prolapse occurs again and does not go back in.  Get help right away if you have very bad rectal pain or if you bleed heavily from your rectum. This information is not intended to replace advice given to you by your health care provider. Make sure you discuss any questions you have with your health care provider. Document Released: 01/07/2015 Document Revised: 10/25/2017 Document Reviewed: 10/25/2017 Elsevier Interactive Patient Education  2019 [employer].

## 2018-06-28 DIAGNOSIS — S80212A Abrasion, left knee, initial encounter: Secondary | ICD-10-CM | POA: Diagnosis not present

## 2018-06-28 DIAGNOSIS — R0781 Pleurodynia: Secondary | ICD-10-CM | POA: Diagnosis not present

## 2018-06-28 DIAGNOSIS — S299XXA Unspecified injury of thorax, initial encounter: Secondary | ICD-10-CM | POA: Diagnosis not present

## 2018-06-28 DIAGNOSIS — W010XXA Fall on same level from slipping, tripping and stumbling without subsequent striking against object, initial encounter: Secondary | ICD-10-CM | POA: Diagnosis not present

## 2018-07-02 ENCOUNTER — Ambulatory Visit (INDEPENDENT_AMBULATORY_CARE_PROVIDER_SITE_OTHER): Payer: PPO

## 2018-07-02 VITALS — BP 138/58 | HR 79 | Temp 98.1°F | Ht 63.0 in | Wt 143.8 lb

## 2018-07-02 DIAGNOSIS — Z0000 Encounter for general adult medical examination without abnormal findings: Secondary | ICD-10-CM

## 2018-07-02 NOTE — Progress Notes (Signed)
Subjective:   Jessica Wheeler is a 78 y.o. female who presents for Medicare Annual (Subsequent) preventive examination.  Review of Systems:  N/A  Cardiac Risk Factors include: advanced age (>48men, >1 women);diabetes mellitus;dyslipidemia;hypertension     Objective:     Vitals: BP (!) 138/58 (BP Location: Right Arm)   Pulse 79   Temp 98.1 F (36.7 C) (Oral)   Ht 5\' 3"  (1.6 m)   Wt 143 lb 12.8 oz (65.2 kg)   BMI 25.47 kg/m   Body mass index is 25.47 kg/m.  Advanced Directives 07/02/2018 06/29/2017 04/04/2017 10/12/2015 08/31/2015 03/04/2015 02/13/2015  Does Patient Have a Medical Advance Directive? Yes Yes Yes No Yes Yes Yes  Type of Estate agent of [place];Living will Healthcare Power of [place];Living will - - Healthcare Power of [place];Living will Living will Living will  Copy of Healthcare Power of Attorney in Chart? No - copy requested No - copy requested - - No - copy requested - -  Pre-existing out of facility DNR order (yellow form or pink MOST form) - - Physician notified to receive inpatient order;Pink MOST form placed in chart (order not valid for inpatient use) - - - -    Tobacco Social History   Tobacco Use  Smoking Status Former Smoker  . Packs/day: 1.00  . Years: 15.00  . Pack years: 15.00  . Types: Cigarettes  . Last attempt to quit: 08/03/2003  . Years since quitting: 14.9  Smokeless Tobacco Never Used     Counseling given: Not Answered   Clinical Intake:  Pre-visit preparation completed: Yes  Pain : 0-10 Pain Score: 2  Pain Type: Acute pain Pain Location: Chest(with deep breath, due to recent fall) Pain Orientation: Left Pain Descriptors / Indicators: Sore Pain Onset: In the past 7 days Pain Frequency: Intermittent    Diabetes:  Is the patient diabetic?  Yes type 2 If diabetic, was a CBG obtained today?  No  Did the patient bring in their glucometer from home?  No  How often do you monitor your CBG's? Once a day  unless BS is elevated .   Financial Strains and Diabetes Management:  Are you having any financial strains with the device, your supplies or your medication? No .  Does the patient want to be seen by Chronic Care Management for management of their diabetes?  No  Would the patient like to be referred to a Nutritionist or for Diabetic Management?  No   Diabetic Exams:  Diabetic Eye Exam: Completed 05/26/17. Overdue for diabetic eye exam. Pt has been advised about the importance in completing this exam. Pt is scheduled for an eye exam 07/17/18. Requested records to be sent to office after completion.   Diabetic Foot Exam: Completed 05/11/17. Pt has been advised about the importance in completing this exam. Note made to f/u on this at CPE tomorrow.   Nutritional Status: BMI 25 -29 Overweight Nutritional Risks: None   How often do you need to have someone help you when you read instructions, pamphlets, or other written materials from your doctor or pharmacy?: 1 - Never  Interpreter Needed?: No  Information entered by :: [facility], LPN  Past Medical History:  Diagnosis Date  . Allergy   . Arthritis   . Bundle branch block, right   . Diabetes mellitus   . Diffuse cystic mastopathy   . Dissection, aorta (HCC)    Dr.Dew follows  . Diverticulosis   . Family history of malignant neoplasm of  gastrointestinal tract   . Glaucoma 2005  . Heart murmur   . History of bronchitis   . History of pancreatitis 2005  . Hypertension 1982  . Personal history of tobacco use, presenting hazards to health   . RBBB   . Ulcer    Past Surgical History:  Procedure Laterality Date  . ABDOMINAL HYSTERECTOMY  1991  . APPENDECTOMY    . BACK SURGERY  1982   for lumbar ruptured disc. No hardware, no discectomy, no fusion  . BLADDER SURGERY    . BREAST BIOPSY Right 1980's   benign  . BREAST EXCISIONAL BIOPSY Right 1980s   benign  . BREAST SURGERY     biopsy  . CARDIAC CATHETERIZATION     Dr. Lady Gary,  few years ago  . CATARACT EXTRACTION W/PHACO Left 08/31/2015   Procedure: CATARACT EXTRACTION PHACO AND INTRAOCULAR LENS PLACEMENT (IOC);  Surgeon: Sherald Hess, MD;  [facility];  Service: Ophthalmology;  Laterality: Left;  DIABETIC - oral meds  . CATARACT EXTRACTION W/PHACO Right 10/12/2015   Procedure: CATARACT EXTRACTION PHACO AND INTRAOCULAR LENS PLACEMENT (IOC) right eye;  Surgeon: Sherald Hess, MD;  Location: [facility];  Service: Ophthalmology;  Laterality: Right;  DIABETIC - oral meds  . CHOLECYSTECTOMY  1994  . COLONOSCOPY  2008,02/22/2012   Dr. Sankar-2013  . COLONOSCOPY WITH PROPOFOL N/A 04/04/2017   Procedure: COLONOSCOPY WITH PROPOFOL;  Surgeon: [name], MD;  Location: [facility] END[facility];  Service: Endoscopy;  Laterality: N/A;  . REFRACTIVE SURGERY     Family History  Problem Relation Age of Onset  . Diabetes Mother   . Hypertension Mother   . Anxiety disorder Mother   . Heart disease Father        rheumatic heart disease  . Heart attack Father   . Rheum arthritis Father   . Colon cancer Brother   . Hypertension Maternal Grandmother   . Hypertension Maternal Grandfather   . Breast cancer Maternal Aunt 60  . Breast cancer Cousin 72   Social History   Socioeconomic History  . Marital status: Widowed    Spouse name: Not on file  . Number of children: 2  . Years of education: 29  . Highest education level: Associate degree: academic program  Occupational History  . Occupation: Retired  Engineer, production  . Financial resource strain: Not hard at all  . Food insecurity:    Worry: Never true    Inability: Never true  . Transportation needs:    Medical: No    Non-medical: No  Tobacco Use  . Smoking status: Former Smoker    Packs/day: 1.00    Years: 15.00    Pack years: 15.00    Types: Cigarettes    Last attempt to quit: 08/03/2003    Years since quitting: 14.9  . Smokeless tobacco: Never Used  Substance and  Sexual Activity  . Alcohol use: No  . Drug use: No  . Sexual activity: Not on file  Lifestyle  . Physical activity:    Days per week: 5 days    Minutes per session: 40 min  . Stress: Not at all  Relationships  . Social connections:    Talks on phone: Patient refused    Gets together: Patient refused    Attends religious service: Patient refused    Active member of club or organization: Patient refused    Attends meetings of clubs or organizations: Patient refused    Relationship status: Patient refused  Other Topics Concern  . Not on file  Social History Narrative  . Not on file    Outpatient Encounter Medications as of 07/02/2018  Medication Sig  . amiloride-hydrochlorothiazide (MODURETIC) 5-50 MG tablet TAKE 1/2 TABLET BY MOUTH DAILY AS NEEDED FOR SWELLING  . amLODipine (NORVASC) 5 MG tablet TAKE 1 TABLET(5 MG) BY MOUTH DAILY  . aspirin 81 MG EC tablet Take 81 mg by mouth daily.    Marland Kitchen Bioflavonoid Products (ESTER C PO) Take 500 mg by mouth daily. occassionally takes 2 tablets  . Cyanocobalamin (VITAMIN B-12) 1000 MCG/15ML LIQD Take 2,500 mcg by mouth daily.   Marland Kitchen glucose blood (FREESTYLE TEST STRIPS) test strip Use as instructed  . Ibuprofen (ADVIL) 200 MG CAPS Take 1 capsule by mouth daily.    Marland Kitchen latanoprost (XALATAN) 0.005 % ophthalmic solution Reported on 10/12/2015 - both eyes nightly  . losartan (COZAAR) 100 MG tablet Take 1 tablet (100 mg total) by mouth daily.  Marland Kitchen MAGNESIUM PO Take 250 mg by mouth. Every 2-3 days  . metFORMIN (GLUCOPHAGE) 500 MG tablet TAKE 2 TABLETS(1000 MG) BY MOUTH TWICE DAILY WITH MEALS  . Baclofen 5 MG TABS Take 10 mg by mouth 3 (three) times daily as needed. (Patient not taking: Reported on 07/02/2018)   No facility-administered encounter medications on file as of 07/02/2018.     Activities of Daily Living In your present state of health, do you have any difficulty performing the following activities: 07/02/2018  Hearing? 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  Vision? N  Comment Wears eye glasses daily.   Difficulty concentrating or making decisions? N  Walking or climbing stairs? N  Dressing or bathing? N  Doing errands, shopping? N  Preparing Food and eating ? N  Using the Toilet? N  In the past six months, have you accidently leaked urine? N  Do you have problems with loss of bowel control? N  Managing your Medications? N  Managing your Finances? N  Housekeeping or managing your Housekeeping? N  Some recent data might be hidden    Patient Care Team: Erasmo Downer, MD as PCP - General (Family Medicine) Antonieta Iba, MD as Consulting Physician (Cardiology) Geanie Logan, MD as Referring Physician (Otolaryngology) Lockie Mola, MD as Referring Physician (Ophthalmology)    Assessment:   This is a routine wellness examination for [place].  Exercise Activities and Dietary recommendations Current Exercise Habits: Home exercise routine, Type of exercise: walking, Time (Minutes): 40, Frequency (Times/Week): 5, Weekly Exercise (Minutes/Week): 200, Intensity: Mild, Exercise limited by: None identified  Goals    . DIET - INCREASE WATER INTAKE     Recommend increasing water intake to 6-8 glasses a day.     Marland Kitchen LIFESTYLE - DECREASE FALLS RISK     Recommend to remove any items from the home that may cause slips or trips.       Fall Risk Fall Risk  07/02/2018 06/18/2018 04/27/2018 06/29/2017 05/11/2017  Falls in the past year? 1 0 0 No No  Number falls in past yr: 0 0 - - -  Injury with Fall? 0 0 - - -  Comment just bruised - - - -  Follow up Falls prevention discussed - - - -   FALL RISK PREVENTION PERTAINING TO THE HOME: Any stairs in or around the home? Yes  If so, do they handrails? Yes   Home free of loose throw rugs in walkways, pet beds, electrical  cords, etc? Yes  Adequate lighting in your home to reduce risk of falls? Yes   ASSISTIVE DEVICES UTILIZED TO PREVENT  FALLS:  Life alert? No  Use of a cane, walker or w/c? No  Grab bars in the bathroom? No  Shower chair or bench in shower? Yes  Elevated toilet seat or a handicapped toilet? No    TIMED UP AND GO:  Was the test performed? No .    Depression Screen PHQ 2/9 Scores 07/02/2018 04/27/2018 06/29/2017 05/11/2017  PHQ - 2 Score 0 0 0 2  PHQ- 9 Score - - 3 6     Cognitive Function: Declined today.         Immunization History  Administered Date(s) Administered  . Influenza, High Dose Seasonal PF 02/03/2017, 01/20/2018  . Influenza-Unspecified 01/28/2016, 01/30/2017  . Pneumococcal Conjugate-13 09/10/2013, 02/26/2014  . Pneumococcal Polysaccharide-23 08/23/2010  . Zoster 06/14/2011    Qualifies for Shingles Vaccine?Yes  Zostavax completed 06/14/11. Due for Shingrix. Education has been provided regarding the importance of this vaccine. Pt has been advised to call insurance company to determine out of pocket expense. Advised may also receive vaccine at local pharmacy or Health Dept. Verbalized acceptance and understanding.  Tdap: Although this vaccine is not a covered service during a Wellness Exam, does the patient still wish to receive this vaccine today?  No .  Education has been provided regarding the importance of this vaccine. Advised may receive this vaccine at local pharmacy or Health Dept. Aware to provide a copy of the vaccination record if obtained from local pharmacy or Health Dept. Verbalized acceptance and understanding.  Flu Vaccine: Up to date  Pneumococcal Vaccine: Up to date   Screening Tests Health Maintenance  Topic Date Due  . FOOT EXAM  05/11/2018  . OPHTHALMOLOGY EXAM  05/26/2018  . HEMOGLOBIN A1C  06/13/2018  . TETANUS/TDAP  01/23/2023 (Originally 02/08/1960)  . INFLUENZA VACCINE  Completed  . DEXA SCAN  Completed  . PNA vac Low Risk Adult  Completed    Cancer Screenings:  Colorectal Screening: No longer required.   Mammogram: No longer required.    Bone Density: Completed 06/06/16. Results reflect NORMAL.   Lung Cancer Screening: (Low Dose CT Chest recommended if Age 23-80 years, 30 pack-year currently smoking OR have quit w/in 15years.) does not qualify.    Additional Screening:  Vision Screening: Recommended annual ophthalmology exams for early detection of glaucoma and other disorders of the eye.  Dental Screening: Recommended annual dental exams for proper oral hygiene  Community Resource Referral:  CRR required this visit?  No       Plan:  I have personally reviewed and addressed the Medicare Annual Wellness questionnaire and have noted the following in the patient's chart:  A. Medical and social history B. Use of alcohol, tobacco or illicit drugs  C. Current medications and supplements D. Functional ability and status E.  Nutritional status F.  Physical activity G. Advance directives H. List of other physicians I.  Hospitalizations, surgeries, and ER visits in previous 12 months J.  Vitals K. Screenings such as hearing and vision if needed, cognitive and depression L. Referrals and appointments - none  In addition, I have reviewed and discussed with patient certain preventive protocols, quality metrics, and best practice recommendations. A written personalized care plan for preventive services as well as general preventive health recommendations were provided to patient.  See attached scanned questionnaire for additional information.   Signed,  Hyacinth Meeker, LPN Nurse Health  Advisor   Nurse Recommendations: Pt needs a diabetic foot exam at CPE tomorrow.

## 2018-07-02 NOTE — Patient Instructions (Addendum)
Jessica Wheeler , Thank you for taking time to come for your Medicare Wellness Visit. I appreciate your ongoing commitment to your health goals. Please review the following plan we discussed and let me know if I can assist you in the future.   Screening recommendations/referrals: Colonoscopy: No longer required.  Mammogram: No longer required.  Bone Density: Up to date, no longer required.  Recommended yearly ophthalmology/optometry visit for glaucoma screening and checkup Recommended yearly dental visit for hygiene and checkup  Vaccinations: Influenza vaccine: Up to date Pneumococcal vaccine: Completed series Tdap vaccine: Pt declines today.  Shingles vaccine: Pt declines today.     Advanced directives: Please bring a copy of your POA (Power of Attorney) and/or Living Will to your next appointment.   Conditions/risks identified: Fall risk prevention discussed. Continue to increase water intake.   Next appointment: 07/03/18 @ 9:00 AM with Dr Beryle Flock.    Preventive Care 23 Years and Older, Female Preventive care refers to lifestyle choices and visits with your health care provider that can promote health and wellness. What does preventive care include?  A yearly physical exam. This is also called an annual well check.  Dental exams once or twice a year.  Routine eye exams. Ask your health care provider how often you should have your eyes checked.  Personal lifestyle choices, including:  Daily care of your teeth and gums.  Regular physical activity.  Eating a healthy diet.  Avoiding tobacco and drug use.  Limiting alcohol use.  Practicing safe sex.  Taking low-dose aspirin every day.  Taking vitamin and mineral supplements as recommended by your health care provider. What happens during an annual well check? The services and screenings done by your health care provider during your annual well check will depend on your age, overall health, lifestyle risk factors, and family  history of disease. Counseling  Your health care provider may ask you questions about your:  Alcohol use.  Tobacco use.  Drug use.  Emotional well-being.  Home and relationship well-being.  Sexual activity.  Eating habits.  History of falls.  Memory and ability to understand (cognition).  Work and work Astronomer.  Reproductive health. Screening  You may have the following tests or measurements:  Height, weight, and BMI.  Blood pressure.  Lipid and cholesterol levels. These may be checked every 5 years, or more frequently if you are over 95 years old.  Skin check.  Lung cancer screening. You may have this screening every year starting at age 5 if you have a 30-pack-year history of smoking and currently smoke or have quit within the past 15 years.  Fecal occult blood test (FOBT) of the stool. You may have this test every year starting at age 52.  Flexible sigmoidoscopy or colonoscopy. You may have a sigmoidoscopy every 5 years or a colonoscopy every 10 years starting at age 28.  Hepatitis C blood test.  Hepatitis B blood test.  Sexually transmitted disease (STD) testing.  Diabetes screening. This is done by checking your blood sugar (glucose) after you have not eaten for a while (fasting). You may have this done every 1-3 years.  Bone density scan. This is done to screen for osteoporosis. You may have this done starting at age 98.  Mammogram. This may be done every 1-2 years. Talk to your health care provider about how often you should have regular mammograms. Talk with your health care provider about your test results, treatment options, and if necessary, the need for more tests. Vaccines  Your health care provider may recommend certain vaccines, such as:  Influenza vaccine. This is recommended every year.  Tetanus, diphtheria, and acellular pertussis (Tdap, Td) vaccine. You may need a Td booster every 10 years.  Zoster vaccine. You may need this after  age 66.  Pneumococcal 13-valent conjugate (PCV13) vaccine. One dose is recommended after age 14.  Pneumococcal polysaccharide (PPSV23) vaccine. One dose is recommended after age 74. Talk to your health care provider about which screenings and vaccines you need and how often you need them. This information is not intended to replace advice given to you by your health care provider. Make sure you discuss any questions you have with your health care provider. Document Released: 05/15/2015 Document Revised: 01/06/2016 Document Reviewed: 02/17/2015 Elsevier Interactive Patient Education  2017 [employer] Prevention in the Home Falls can cause injuries. They can happen to people of all ages. There are many things you can do to make your home safe and to help prevent falls. What can I do on the outside of my home?  Regularly fix the edges of walkways and driveways and fix any cracks.  Remove anything that might make you trip as you walk through a door, such as a raised step or threshold.  Trim any bushes or trees on the path to your home.  Use bright outdoor lighting.  Clear any walking paths of anything that might make someone trip, such as rocks or tools.  Regularly check to see if handrails are loose or broken. Make sure that both sides of any steps have handrails.  Any raised decks and porches should have guardrails on the edges.  Have any leaves, snow, or ice cleared regularly.  Use sand or salt on walking paths during winter.  Clean up any spills in your garage right away. This includes oil or grease spills. What can I do in the bathroom?  Use night lights.  Install grab bars by the toilet and in the tub and shower. Do not use towel bars as grab bars.  Use non-skid mats or decals in the tub or shower.  If you need to sit down in the shower, use a plastic, non-slip stool.  Keep the floor dry. Clean up any water that spills on the floor as soon as it  happens.  Remove soap buildup in the tub or shower regularly.  Attach bath mats securely with double-sided non-slip rug tape.  Do not have throw rugs and other things on the floor that can make you trip. What can I do in the bedroom?  Use night lights.  Make sure that you have a light by your bed that is easy to reach.  Do not use any sheets or blankets that are too big for your bed. They should not hang down onto the floor.  Have a firm chair that has side arms. You can use this for support while you get dressed.  Do not have throw rugs and other things on the floor that can make you trip. What can I do in the kitchen?  Clean up any spills right away.  Avoid walking on wet floors.  Keep items that you use a lot in easy-to-reach places.  If you need to reach something above you, use a strong step stool that has a grab bar.  Keep electrical cords out of the way.  Do not use floor polish or wax that makes floors slippery. If you must use wax, use non-skid floor wax.  Do  not have throw rugs and other things on the floor that can make you trip. What can I do with my stairs?  Do not leave any items on the stairs.  Make sure that there are handrails on both sides of the stairs and use them. Fix handrails that are broken or loose. Make sure that handrails are as long as the stairways.  Check any carpeting to make sure that it is firmly attached to the stairs. Fix any carpet that is loose or worn.  Avoid having throw rugs at the top or bottom of the stairs. If you do have throw rugs, attach them to the floor with carpet tape.  Make sure that you have a light switch at the top of the stairs and the bottom of the stairs. If you do not have them, ask someone to add them for you. What else can I do to help prevent falls?  Wear shoes that:  Do not have high heels.  Have rubber bottoms.  Are comfortable and fit you well.  Are closed at the toe. Do not wear sandals.  If you  use a stepladder:  Make sure that it is fully opened. Do not climb a closed stepladder.  Make sure that both sides of the stepladder are locked into place.  Ask someone to hold it for you, if possible.  Clearly mark and make sure that you can see:  Any grab bars or handrails.  First and last steps.  Where the edge of each step is.  Use tools that help you move around (mobility aids) if they are needed. These include:  Canes.  Walkers.  Scooters.  Crutches.  Turn on the lights when you go into a dark area. Replace any light bulbs as soon as they burn out.  Set up your furniture so you have a clear path. Avoid moving your furniture around.  If any of your floors are uneven, fix them.  If there are any pets around you, be aware of where they are.  Review your medicines with your doctor. Some medicines can make you feel dizzy. This can increase your chance of falling. Ask your doctor what other things that you can do to help prevent falls. This information is not intended to replace advice given to you by your health care provider. Make sure you discuss any questions you have with your health care provider. Document Released: 02/12/2009 Document Revised: 09/24/2015 Document Reviewed: 05/23/2014 Elsevier Interactive Patient Education  2017 [employer].

## 2018-07-03 ENCOUNTER — Encounter: Payer: Self-pay | Admitting: Family Medicine

## 2018-07-03 ENCOUNTER — Other Ambulatory Visit: Payer: Self-pay

## 2018-07-03 ENCOUNTER — Ambulatory Visit (INDEPENDENT_AMBULATORY_CARE_PROVIDER_SITE_OTHER): Payer: PPO | Admitting: Family Medicine

## 2018-07-03 VITALS — BP 169/78 | HR 78 | Temp 97.8°F | Ht 63.0 in | Wt 143.6 lb

## 2018-07-03 DIAGNOSIS — I10 Essential (primary) hypertension: Secondary | ICD-10-CM

## 2018-07-03 DIAGNOSIS — E782 Mixed hyperlipidemia: Secondary | ICD-10-CM

## 2018-07-03 DIAGNOSIS — W19XXXD Unspecified fall, subsequent encounter: Secondary | ICD-10-CM

## 2018-07-03 DIAGNOSIS — Z0000 Encounter for general adult medical examination without abnormal findings: Secondary | ICD-10-CM | POA: Diagnosis not present

## 2018-07-03 DIAGNOSIS — E1159 Type 2 diabetes mellitus with other circulatory complications: Secondary | ICD-10-CM

## 2018-07-03 DIAGNOSIS — J301 Allergic rhinitis due to pollen: Secondary | ICD-10-CM

## 2018-07-03 NOTE — Patient Instructions (Signed)
Fall Prevention in the Home, Adult Falls can cause injuries. They can happen to people of all ages. There are many things you can do to make your home safe and to help prevent falls. Ask for help when making these changes, if needed. What actions can I take to prevent falls? General Instructions  Use good lighting in all rooms. Replace any light bulbs that burn out.  Turn on the lights when you go into a dark area. Use night-lights.  Keep items that you use often in easy-to-reach places. Lower the shelves around your home if necessary.  Set up your furniture so you have a clear path. Avoid moving your furniture around.  Do not have throw rugs and other things on the floor that can make you trip.  Avoid walking on wet floors.  If any of your floors are uneven, fix them.  Add color or contrast paint or tape to clearly mark and help you see: ? Any grab bars or handrails. ? First and last steps of stairways. ? Where the edge of each step is.  If you use a stepladder: ? Make sure that it is fully opened. Do not climb a closed stepladder. ? Make sure that both sides of the stepladder are locked into place. ? Ask someone to hold the stepladder for you while you use it.  If there are any pets around you, be aware of where they are. What can I do in the bathroom?      Keep the floor dry. Clean up any water that spills onto the floor as soon as it happens.  Remove soap buildup in the tub or shower regularly.  Use non-skid mats or decals on the floor of the tub or shower.  Attach bath mats securely with double-sided, non-slip rug tape.  If you need to sit down in the shower, use a plastic, non-slip stool.  Install grab bars by the toilet and in the tub and shower. Do not use towel bars as grab bars. What can I do in the bedroom?  Make sure that you have a light by your bed that is easy to reach.  Do not use any sheets or blankets that are too big for your bed. They should not  hang down onto the floor.  Have a firm chair that has side arms. You can use this for support while you get dressed. What can I do in the kitchen?  Clean up any spills right away.  If you need to reach something above you, use a strong step stool that has a grab bar.  Keep electrical cords out of the way.  Do not use floor polish or wax that makes floors slippery. If you must use wax, use non-skid floor wax. What can I do with my stairs?  Do not leave any items on the stairs.  Make sure that you have a light switch at the top of the stairs and the bottom of the stairs. If you do not have them, ask someone to add them for you.  Make sure that there are handrails on both sides of the stairs, and use them. Fix handrails that are broken or loose. Make sure that handrails are as long as the stairways.  Install non-slip stair treads on all stairs in your home.  Avoid having throw rugs at the top or bottom of the stairs. If you do have throw rugs, attach them to the floor with carpet tape.  Choose a carpet that does  not hide the edge of the steps on the stairway.  Check any carpeting to make sure that it is firmly attached to the stairs. Fix any carpet that is loose or worn. What can I do on the outside of my home?  Use bright outdoor lighting.  Regularly fix the edges of walkways and driveways and fix any cracks.  Remove anything that might make you trip as you walk through a door, such as a raised step or threshold.  Trim any bushes or trees on the path to your home.  Regularly check to see if handrails are loose or broken. Make sure that both sides of any steps have handrails.  Install guardrails along the edges of any raised decks and porches.  Clear walking paths of anything that might make someone trip, such as tools or rocks.  Have any leaves, snow, or ice cleared regularly.  Use sand or salt on walking paths during winter.  Clean up any spills in your garage right  away. This includes grease or oil spills. What other actions can I take?  Wear shoes that: ? Have a low heel. Do not wear high heels. ? Have rubber bottoms. ? Are comfortable and fit you well. ? Are closed at the toe. Do not wear open-toe sandals.  Use tools that help you move around (mobility aids) if they are needed. These include: ? Canes. ? Walkers. ? Scooters. ? Crutches.  Review your medicines with your doctor. Some medicines can make you feel dizzy. This can increase your chance of falling. Ask your doctor what other things you can do to help prevent falls. Where to find more information  Centers for Disease Control and Prevention, [facility]AD[facility]: https://garcia.biz/  [employer] on Aging: BrainJudge.co.uk Contact a doctor if:  You are afraid of falling at home.  You feel weak, drowsy, or dizzy at home.  You fall at home. Summary  There are many simple things that you can do to make your home safe and to help prevent falls.  Ways to make your home safe include removing tripping hazards and installing grab bars in the bathroom.  Ask for help when making these changes in your home. This information is not intended to replace advice given to you by your health care provider. Make sure you discuss any questions you have with your health care provider. Document Released: 02/12/2009 Document Revised: 12/01/2016 Document Reviewed: 12/01/2016 Elsevier Interactive Patient Education  2019 [place] 65 Years and Older, Female Preventive care refers to lifestyle choices and visits with your health care provider that can promote health and wellness. What does preventive care include?  A yearly physical exam. This is also called an annual well check.  Dental exams once or twice a year.  Routine eye exams. Ask your health care provider how often you should have your eyes checked.  Personal lifestyle choices, including: ? Daily care of your teeth  and gums. ? Regular physical activity. ? Eating a healthy diet. ? Avoiding tobacco and drug use. ? Limiting alcohol use. ? Practicing safe sex. ? Taking low-dose aspirin every day. ? Taking vitamin and mineral supplements as recommended by your health care provider. What happens during an annual well check? The services and screenings done by your health care provider during your annual well check will depend on your age, overall health, lifestyle risk factors, and family history of disease. Counseling Your health care provider may ask you questions about your:  Alcohol use.  Tobacco use.  Drug use.  Emotional well-being.  Home and relationship well-being.  Sexual activity.  Eating habits.  History of falls.  Memory and ability to understand (cognition).  Work and work Statistician.  Reproductive health.  Screening You may have the following tests or measurements:  Height, weight, and BMI.  Blood pressure.  Lipid and cholesterol levels. These may be checked every 5 years, or more frequently if you are over 21 years old.  Skin check.  Lung cancer screening. You may have this screening every year starting at age 32 if you have a 30-pack-year history of smoking and currently smoke or have quit within the past 15 years.  Colorectal cancer screening. All adults should have this screening starting at age 35 and continuing until age 14. You will have tests every 1-10 years, depending on your results and the type of screening test. People at increased risk should start screening at an earlier age. Screening tests may include: ? Guaiac-based fecal occult blood testing. ? Fecal immunochemical test (FIT). ? Stool DNA test. ? Virtual colonoscopy. ? Sigmoidoscopy. During this test, a flexible tube with a tiny camera (sigmoidoscope) is used to examine your rectum and lower colon. The sigmoidoscope is inserted through your anus into your rectum and lower colon. ? Colonoscopy.  During this test, a long, thin, flexible tube with a tiny camera (colonoscope) is used to examine your entire colon and rectum.  Hepatitis C blood test.  Hepatitis B blood test.  Sexually transmitted disease (STD) testing.  Diabetes screening. This is done by checking your blood sugar (glucose) after you have not eaten for a while (fasting). You may have this done every 1-3 years.  Bone density scan. This is done to screen for osteoporosis. You may have this done starting at age 72.  Mammogram. This may be done every 1-2 years. Talk to your health care provider about how often you should have regular mammograms. Talk with your health care provider about your test results, treatment options, and if necessary, the need for more tests. Vaccines Your health care provider may recommend certain vaccines, such as:  Influenza vaccine. This is recommended every year.  Tetanus, diphtheria, and acellular pertussis (Tdap, Td) vaccine. You may need a Td booster every 10 years.  Varicella vaccine. You may need this if you have not been vaccinated.  Zoster vaccine. You may need this after age 37.  Measles, mumps, and rubella (MMR) vaccine. You may need at least one dose of MMR if you were born in 1957 or later. You may also need a second dose.  Pneumococcal 13-valent conjugate (PCV13) vaccine. One dose is recommended after age 13.  Pneumococcal polysaccharide (PPSV23) vaccine. One dose is recommended after age 69.  Meningococcal vaccine. You may need this if you have certain conditions.  Hepatitis A vaccine. You may need this if you have certain conditions or if you travel or work in places where you may be exposed to hepatitis A.  Hepatitis B vaccine. You may need this if you have certain conditions or if you travel or work in places where you may be exposed to hepatitis B.  Haemophilus influenzae type b (Hib) vaccine. You may need this if you have certain conditions. Talk to your health care  provider about which screenings and vaccines you need and how often you need them. This information is not intended to replace advice given to you by your health care provider. Make sure you discuss any questions you have with your health care provider.  Document Released: 05/15/2015 Document Revised: 06/08/2017 Document Reviewed: 02/17/2015 Elsevier Interactive Patient Education  2019 [employer].

## 2018-07-03 NOTE — Progress Notes (Signed)
Patient: Jessica Wheeler, Female    DOB: 09-Dec-1940, 78 y.o.   MRN: [id number] Visit Date: 07/05/2018  Today's Provider: Shirlee Latch, MD   Chief Complaint  Patient presents with  . Annual Exam   Subjective:    I, [name], CMA, am acting as a scribe for Shirlee Latch, MD.   Patient had a AWE with McKenzie on 07/02/2018   Complete Physical Jessica Wheeler is a 78 y.o. female. She feels fair. She reports falling during her morning walk on 06/28/2018. Patient was seen at [facility]. She had a CXR done at that time.  She reports exercising before her fall last week. She reports she is sleeping fairly well.  Mechanical fall 5 days ago.  Landed on left knee and left side.  She was seen on 06/28/2018 at [facility] urgent care.  She was told that she had a rib fracture.  She states she is slowly getting better, but she continues to have some shortness of breath.    T2DM - Checking BG at home: yes, fastings elevated in 150s - Medications: MEtformin  BID - Compliance: good - Diet: low carb - eye exam: upcoming on 07/13/2018 - foot exam: needs today - microalbumin: n/a ARB - denies symptoms of hypoglycemia, polyuria, polydipsia, numbness extremities, foot ulcers/trauma   HTN: - Medications: Moduretic 5-50 mg tablet-take one half daily as needed for swelling, losartan 100 mg daily, amlodipine 5 mg daily - Compliance: Good, denies any missed doses but did not take her medications this morning as she was fasting and did not know if she should take them or not  - Denies any SOB, CP, vision changes, LE edema, medication SEs, or symptoms of hypotension  -----------------------------------------------------------   Review of Systems  Constitutional: Negative.   HENT: Positive for hearing loss, rhinorrhea, sinus pain and sneezing.   Eyes: Positive for discharge.  Respiratory: Positive for shortness of breath and wheezing.   Cardiovascular: Negative.     Gastrointestinal: Negative.   Endocrine: Negative.   Genitourinary: Negative.   Musculoskeletal: Positive for back pain.  Skin: Negative.   Allergic/Immunologic: Positive for environmental allergies.  Neurological: Negative.   Hematological: Negative.   Psychiatric/Behavioral: Negative.     Social History   Socioeconomic History  . Marital status: Widowed    Spouse name: Not on file  . Number of children: 2  . Years of education: 58  . Highest education level: Associate degree: academic program  Occupational History  . Occupation: Retired  Engineer, production  . Financial resource strain: Not hard at all  . Food insecurity:    Worry: Never true    Inability: Never true  . Transportation needs:    Medical: No    Non-medical: No  Tobacco Use  . Smoking status: Former Smoker    Packs/day: 1.00    Years: 15.00    Pack years: 15.00    Types: Cigarettes    Last attempt to quit: 08/03/2003    Years since quitting: 14.9  . Smokeless tobacco: Never Used  Substance and Sexual Activity  . Alcohol use: No  . Drug use: No  . Sexual activity: Not on file  Lifestyle  . Physical activity:    Days per week: 5 days    Minutes per session: 40 min  . Stress: Not at all  Relationships  . Social connections:    Talks on phone: Patient refused    Gets together: Patient refused    Attends  religious service: Patient refused    Active member of club or organization: Patient refused    Attends meetings of clubs or organizations: Patient refused    Relationship status: Patient refused  . Intimate partner violence:    Fear of current or ex partner: Patient refused    Emotionally abused: Patient refused    Physically abused: Patient refused    Forced sexual activity: Patient refused  Other Topics Concern  . Not on file  Social History Narrative  . Not on file    Past Medical History:  Diagnosis Date  . Allergy   . Arthritis   . Bundle branch block, right   . Diabetes mellitus   .  Diffuse cystic mastopathy   . Dissection, aorta (HCC)    Dr.Dew follows  . Diverticulosis   . Family history of malignant neoplasm of gastrointestinal tract   . Glaucoma 2005  . Heart murmur   . History of bronchitis   . History of pancreatitis 2005  . Hypertension 1982  . Personal history of tobacco use, presenting hazards to health   . RBBB   . Ulcer      Patient Active Problem List   Diagnosis Date Noted  . Fatigue 12/11/2017  . Lower extremity edema 06/29/2017  . Primary osteoarthritis of left knee 03/10/2016  . Depression 12/28/2015  . Allergic rhinitis 01/31/2015  . Benign paroxysmal positional nystagmus 01/31/2015  . Bladder infection, chronic 01/31/2015  . Glaucoma 01/31/2015  . Alopecia 01/31/2015  . Chronic hyponatremia 01/31/2015  . Muscle spasm 01/31/2015  . 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 Anxiety 11/04/2014  . Muscle weakness 06/24/2014  . Fibrocystic breast disease 04/30/2013  . 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 Renal artery stenosis (HCC) 04/18/2012  . Murmur 10/10/2011  . 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HCC) 03/31/2011  . HTN (hypertension) 08/03/2010  . CAD (coronary artery disease) 08/03/2010  . DM (diabetes mellitus) (HCC) 08/03/2010  . Hyperlipidemia 08/03/2010    Past Surgical History:  Procedure Laterality Date  . ABDOMINAL HYSTERECTOMY  1991  . APPENDECTOMY    . BACK SURGERY  1982   for lumbar ruptured disc. No hardware, no discectomy, no fusion  . BLADDER SURGERY    . BREAST BIOPSY Right 1980's   benign  . BREAST EXCISIONAL BIOPSY Right 1980s   benign  . BREAST SURGERY     biopsy  . CARDIAC CATHETERIZATION     Dr. Lady Gary, few years ago  . CATARACT EXTRACTION W/PHACO Left 08/31/2015   Procedure: CATARACT EXTRACTION PHACO AND INTRAOCULAR LENS PLACEMENT (IOC);  Surgeon: Sherald Hess, MD;  Location: [facility];  Service: Ophthalmology;  Laterality: Left;  DIABETIC - oral meds  . CATARACT EXTRACTION  W/PHACO Right 10/12/2015   Procedure: CATARACT EXTRACTION PHACO AND INTRAOCULAR LENS PLACEMENT (IOC) right eye;  Surgeon: Sherald Hess, MD;  Location: [facility];  Service: Ophthalmology;  Laterality: Right;  DIABETIC - oral meds  . CHOLECYSTECTOMY  1994  . COLONOSCOPY  2008,02/22/2012   Dr. Sankar-2013  . COLONOSCOPY WITH PROPOFOL N/A 04/04/2017   Procedure: COLONOSCOPY WITH PROPOFOL;  Surgeon: [name], MD;  Location: [facility];  Service: Endoscopy;  Laterality: N/A;  . REFRACTIVE SURGERY      Her family history includes Anxiety disorder in her mother; Breast cancer (age of onset: 59) in her cousin; Breast cancer (age of onset: 82) in her maternal aunt; Colon cancer in her brother; Diabetes in her mother; Heart attack in her father; Heart  disease in her father; Hypertension in her maternal grandfather, maternal grandmother, and mother; Rheum arthritis in her father.   Current Outpatient Medications:  .  amiloride-hydrochlorothiazide (MODURETIC) 5-50 MG tablet, TAKE 1/2 TABLET BY MOUTH DAILY AS NEEDED FOR SWELLING, Disp: 45 tablet, Rfl: 2 .  amLODipine (NORVASC) 5 MG tablet, TAKE 1 TABLET(5 MG) BY MOUTH DAILY, Disp: 90 tablet, Rfl: 2 .  aspirin 81 MG EC tablet, Take 81 mg by mouth daily.  , Disp: , Rfl:  .  Bioflavonoid Products (ESTER C PO), Take 500 mg by mouth daily. occassionally takes 2 tablets, Disp: , Rfl:  .  Cyanocobalamin (VITAMIN B-12) 1000 MCG/15ML LIQD, Take 2,500 mcg by mouth daily. , Disp: , Rfl:  .  glucose blood (FREESTYLE TEST STRIPS) test strip, Use as instructed, Disp: 100 each, Rfl: 12 .  Ibuprofen (ADVIL) 200 MG CAPS, Take 1 capsule by mouth daily.  , Disp: , Rfl:  .  latanoprost (XALATAN) 0.005 % ophthalmic solution, Reported on 10/12/2015 - both eyes nightly, Disp: , Rfl: 3 .  losartan (COZAAR) 100 MG tablet, Take 1 tablet (100 mg total) by mouth daily., Disp: 90 tablet, Rfl: 2 .  MAGNESIUM PO, Take 250 mg by mouth. Every 2-3 days,  Disp: , Rfl:  .  metFORMIN (GLUCOPHAGE) 500 MG tablet, TAKE 2 TABLETS(1000 MG) BY MOUTH TWICE DAILY WITH MEALS, Disp: 360 tablet, Rfl: 3 .  Baclofen 5 MG TABS, Take 10 mg by mouth 3 (three) times daily as needed. (Patient not taking: Reported on 07/02/2018), Disp: 90 tablet, Rfl: 1  Patient Care Team: Erasmo Downer, MD as PCP - General (Family Medicine) Mariah Milling, Tollie Pizza, MD as Consulting Physician (Cardiology) Geanie Logan, MD as Referring Physician (Otolaryngology) Lockie Mola, MD as Referring Physician (Ophthalmology)     Objective:    Vitals: BP (!) 169/78 (BP Location: Right Arm, Patient Position: Sitting, Cuff Size: Normal)   Pulse 78   Temp 97.8 F (36.6 C) (Oral)   Ht 5\' 3"  (1.6 m)   Wt 143 lb 9.6 oz (65.1 kg)   SpO2 99%   BMI 25.44 kg/m   Physical Exam Vitals signs reviewed.  Constitutional:      General: She is not in acute distress.    Appearance: Normal appearance. She is well-developed. She is not diaphoretic.  HENT:     Head: Normocephalic and atraumatic.     Right Ear: Tympanic membrane, ear canal and external ear normal.     Left Ear: Tympanic membrane, ear canal and external ear normal.     Nose: Nose normal.     Mouth/Throat:     Mouth: Mucous membranes are moist.     Pharynx: Oropharynx is clear. No oropharyngeal exudate.  Eyes:     General: No scleral icterus.    Conjunctiva/sclera: Conjunctivae normal.     Pupils: Pupils are equal, round, and reactive to light.  Neck:     Musculoskeletal: Neck supple.     Thyroid: No thyromegaly.  Cardiovascular:     Rate and Rhythm: Normal rate and regular rhythm.     Pulses: Normal pulses.     Heart sounds: Normal heart sounds. No murmur.  Pulmonary:     Effort: Pulmonary effort is normal. No respiratory distress.     Breath sounds: Normal breath sounds. No wheezing or rales.  Abdominal:     General: Bowel sounds are normal. There is no distension.     Palpations: Abdomen is soft.      Tenderness: There  is no abdominal tenderness. There is no guarding or rebound.  Musculoskeletal:        General: No deformity.     Right lower leg: No edema.     Left lower leg: No edema.     Comments: bruising noted on L knee and L ribs  Lymphadenopathy:     Cervical: No cervical adenopathy.  Skin:    General: Skin is warm and dry.     Capillary Refill: Capillary refill takes less than 2 seconds.     Findings: No rash.  Neurological:     Mental Status: She is alert and oriented to person, place, and time. Mental status is at baseline.     Sensory: No sensory deficit.     Motor: No weakness.     Gait: Gait normal.  Psychiatric:        Mood and Affect: Mood normal.        Behavior: Behavior normal.        Thought Content: Thought content normal.     Activities of Daily Living In your present state of health, do you have any difficulty performing the following activities: 07/03/2018 07/02/2018  Hearing? 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  Vision? N N  Comment - Wears eye glasses daily.   Difficulty concentrating or making decisions? N N  Walking or climbing stairs? N N  Dressing or bathing? N N  Doing errands, shopping? N N  Preparing Food and eating ? - N  Using the Toilet? - N  In the past six months, have you accidently leaked urine? - N  Do you have problems with loss of bowel control? - N  Managing your Medications? - N  Managing your Finances? - N  Housekeeping or managing your Housekeeping? - N  Some recent data might be hidden    Fall Risk Assessment Fall Risk  07/03/2018 07/03/2018 07/02/2018 06/18/2018 04/27/2018  Falls in the past year? 0 0  Number falls in past yr: 0 0 0 0 -  Injury with Fall? 1 1 0 0 -  Comment - - just bruised - -  Follow up - - Falls prevention discussed - -     Depression Screen PHQ 2/9 Scores 07/03/2018 07/03/2018 07/02/2018 04/27/2018  PHQ - 2 Score 0 0 0 0  PHQ- 9 Score 0 - - -    No flowsheet  data found.     Assessment & Plan:    Annual Physical Reviewed patient's Family Medical History Reviewed and updated list of patient's medical providers Assessment of cognitive impairment was done Assessed patient's functional ability Established a written schedule for health screening services Health Risk Assessent Completed and Reviewed  Exercise Activities and Dietary recommendations Goals    . DIET - INCREASE WATER INTAKE     Recommend increasing water intake to 6-8 glasses a day.     Marland Kitchen LIFESTYLE - DECREASE FALLS RISK     Recommend to remove any items from the home that may cause slips or trips.       Immunization History  Administered Date(s) Administered  . Influenza, High Dose Seasonal PF 02/03/2017, 01/20/2018  . Influenza-Unspecified 01/28/2016, 01/30/2017  . Pneumococcal Conjugate-13 09/10/2013, 02/26/2014  . Pneumococcal Polysaccharide-23 08/23/2010  . Zoster 06/14/2011    Health Maintenance  Topic Date Due  . OPHTHALMOLOGY EXAM  05/26/2018  . HEMOGLOBIN A1C  06/13/2018  . TETANUS/TDAP  01/23/2023 (Originally 02/08/1958)  .  FOOT EXAM  07/03/2019  . INFLUENZA VACCINE  Completed  . DEXA SCAN  Completed  . PNA vac Low Risk Adult  Completed     Discussed health benefits of physical activity, and encouraged her to engage in regular exercise appropriate for her age and condition.    ------------------------------------------------------------------------------------------------------------  Problem List Items Addressed This Visit      Cardiovascular and Mediastinum   HTN (hypertension)    Uncontrolled, but has not had her medications yet this morning Home blood pressures are typically in the 130s systolic She is asymptomatic Recheck metabolic panel Continue current medications Reevaluate at follow-up      Relevant Orders   Comprehensive metabolic panel (Completed)     Respiratory   Allergic rhinitis    Worsening recently Discussed that she can  take a second GEN antihistamine OTC as well as Flonase Discussed symptom management        Endocrine   DM (diabetes mellitus) (HCC)    Recheck A1c Continue metformin at current dose Discussed importance of blood sugar control Up-to-date on vaccinations Foot exam completed today Discussed need for eye exam      Relevant Orders   Hemoglobin A1c (Completed)     Other   Hyperlipidemia    Recheck lipid panel in and CMP Discussed likely need for statin, which she is not currently taking due to diabetes Discussed diet and exercise      Relevant Orders   Lipid panel (Completed)   Comprehensive metabolic panel (Completed)    Other Visit Diagnoses    Encounter for annual physical exam    -  Primary   Fall, subsequent encounter        -Recent mechanical fall with rib fracture -No other injuries except for abrasion of left knee which is healing well -Discussed importance of continuing to take deep breaths even though it is painful to avoid posttraumatic pneumonia -Discussed strict return precautions and fall precautions   Return in about 6 months (around 01/03/2019) for chronic disease f/u.   The entirety of the information documented in the History of Present Illness, Review of Systems and Physical Exam were personally obtained by me. Portions of this information were initially documented by [name], CMA and reviewed by me for thoroughness and accuracy.    Erasmo Downer, MD, MPH [facility] 07/05/2018 12:36 PM

## 2018-07-04 DIAGNOSIS — I10 Essential (primary) hypertension: Secondary | ICD-10-CM | POA: Diagnosis not present

## 2018-07-04 DIAGNOSIS — E782 Mixed hyperlipidemia: Secondary | ICD-10-CM | POA: Diagnosis not present

## 2018-07-04 DIAGNOSIS — E1159 Type 2 diabetes mellitus with other circulatory complications: Secondary | ICD-10-CM | POA: Diagnosis not present

## 2018-07-05 LAB — HEMOGLOBIN A1C
Est. average glucose Bld gHb Est-mCnc: 148 mg/dL
Hgb A1c MFr Bld: 6.8 % — ABNORMAL HIGH (ref 4.8–5.6)

## 2018-07-05 LAB — COMPREHENSIVE METABOLIC PANEL
ALT: 26 IU/L (ref 0–32)
AST: 24 IU/L (ref 0–40)
Albumin/Globulin Ratio: 1.8 (ref 1.2–2.2)
Albumin: 4.4 g/dL (ref 3.7–4.7)
Alkaline Phosphatase: 49 IU/L (ref 39–117)
BUN/Creatinine Ratio: 22 (ref 12–28)
BUN: 15 mg/dL (ref 8–27)
Bilirubin Total: 0.3 mg/dL (ref 0.0–1.2)
CO2: 23 mmol/L (ref 20–29)
Calcium: 9.8 mg/dL (ref 8.7–10.3)
Chloride: 92 mmol/L — ABNORMAL LOW (ref 96–106)
Creatinine, Ser: 0.69 mg/dL (ref 0.57–1.00)
GFR calc Af Amer: 96 mL/min/{1.73_m2} (ref >59)
GFR calc non Af Amer: 83 mL/min/{1.73_m2} (ref >59)
Globulin, Total: 2.4 g/dL (ref 1.5–4.5)
Glucose: 133 mg/dL — ABNORMAL HIGH (ref 65–99)
Potassium: 4.6 mmol/L (ref 3.5–5.2)
Sodium: 131 mmol/L — ABNORMAL LOW (ref 134–144)
Total Protein: 6.8 g/dL (ref 6.0–8.5)

## 2018-07-05 LAB — LIPID PANEL
Chol/HDL Ratio: 2.2 ratio (ref 0.0–4.4)
Cholesterol, Total: 178 mg/dL (ref 100–199)
HDL: 82 mg/dL (ref >39)
LDL Calculated: 80 mg/dL (ref 0–99)
Triglycerides: 81 mg/dL (ref 0–149)
VLDL Cholesterol Cal: 16 mg/dL (ref 5–40)

## 2018-07-05 NOTE — Assessment & Plan Note (Signed)
Worsening recently Discussed that she can take a second GEN antihistamine OTC as well as Flonase Discussed symptom management

## 2018-07-05 NOTE — Assessment & Plan Note (Signed)
Recheck A1c Continue metformin at current dose Discussed importance of blood sugar control Up-to-date on vaccinations Foot exam completed today Discussed need for eye exam

## 2018-07-05 NOTE — Assessment & Plan Note (Signed)
Uncontrolled, but has not had her medications yet this morning Home blood pressures are typically in the 130s systolic She is asymptomatic Recheck metabolic panel Continue current medications Reevaluate at follow-up

## 2018-07-05 NOTE — Assessment & Plan Note (Signed)
Recheck lipid panel in and CMP Discussed likely need for statin, which she is not currently taking due to diabetes Discussed diet and exercise

## 2018-07-06 ENCOUNTER — Telehealth: Payer: Self-pay

## 2018-07-06 NOTE — Telephone Encounter (Signed)
Called back. No answer. Left generic VM with no patient information asking them to call us back if concerns remained.

## 2018-07-06 NOTE — Telephone Encounter (Signed)
Destiny from [facility] called requesting to speak to you directly on phone in regards to patient. She request that you give her a call back when your available at [phone].KW

## 2018-07-13 DIAGNOSIS — H40003 Preglaucoma, unspecified, bilateral: Secondary | ICD-10-CM | POA: Diagnosis not present

## 2018-07-13 LAB — HM DIABETES EYE EXAM

## 2018-08-13 DIAGNOSIS — H6122 Impacted cerumen, left ear: Secondary | ICD-10-CM | POA: Diagnosis not present

## 2018-08-13 DIAGNOSIS — H60333 Swimmer's ear, bilateral: Secondary | ICD-10-CM | POA: Diagnosis not present

## 2018-09-15 ENCOUNTER — Other Ambulatory Visit: Payer: Self-pay | Admitting: Family Medicine

## 2018-09-15 DIAGNOSIS — I10 Essential (primary) hypertension: Secondary | ICD-10-CM

## 2018-11-15 DIAGNOSIS — J301 Allergic rhinitis due to pollen: Secondary | ICD-10-CM | POA: Diagnosis not present

## 2018-11-15 DIAGNOSIS — H6123 Impacted cerumen, bilateral: Secondary | ICD-10-CM | POA: Diagnosis not present

## 2018-11-19 DIAGNOSIS — Z7984 Long term (current) use of oral hypoglycemic drugs: Secondary | ICD-10-CM | POA: Diagnosis not present

## 2018-11-19 DIAGNOSIS — Z961 Presence of intraocular lens: Secondary | ICD-10-CM | POA: Diagnosis not present

## 2018-11-19 DIAGNOSIS — H43813 Vitreous degeneration, bilateral: Secondary | ICD-10-CM | POA: Diagnosis not present

## 2018-11-19 DIAGNOSIS — E119 Type 2 diabetes mellitus without complications: Secondary | ICD-10-CM | POA: Diagnosis not present

## 2018-11-19 DIAGNOSIS — H401131 Primary open-angle glaucoma, bilateral, mild stage: Secondary | ICD-10-CM | POA: Diagnosis not present

## 2018-11-19 LAB — HM DIABETES EYE EXAM

## 2018-12-12 NOTE — Progress Notes (Signed)
Patient: Jessica Wheeler Female    DOB: October 23, 1940   78 y.o.   MRN: [id number] Visit Date: 12/13/2018  Today's Provider: Lavon Paganini, MD   Chief Complaint  Patient presents with  . Diabetes  . Hypertension   Subjective:    HPI  Diabetes Mellitus Type II, Follow-up:   Lab Results  Component Value Date   HGBA1C 7.0 (A) 12/13/2018   HGBA1C 6.8 (H) 07/04/2018   HGBA1C 6.7 (A) 12/11/2017   Last seen for diabetes 5 months ago.  Management since then includes none. She reports good compliance with treatment. She is not having side effects.  Current symptoms include none and have been stable. Home blood sugar records: around 160's-170's and 190s-200s 2 hours postprandial  Episodes of hypoglycemia? no   Current Insulin Regimen: N/A Most Recent Eye Exam: 10/2018 Weight trend: stable Prior visit with dietician: no Current diet: well balanced Current exercise: walking  States that this is the same pattern of hyperglycemia that she has had in the past with an infection, ut she has no infectious symptoms.   ------------------------------------------------------------------------   Hypertension, follow-up:  BP Readings from Last 3 Encounters:  12/13/18 (!) 168/76  07/03/18 (!) 169/78  07/02/18 (!) 138/58    She was last seen for hypertension 5 months ago.  BP at that visit was:169/78. Management since that visit includes none.She reports good compliance with treatment. She is not having side effects.  She is exercising. She is adherent to low salt diet.   Outside blood pressures are 185U-314H systolic. She is experiencing none.  Patient denies chest pain, chest pressure/discomfort, exertional chest pressure/discomfort, fatigue, irregular heart beat, palpitations and tachypnea.   Cardiovascular risk factors include advanced age (older than 57 for men, 14 for women), diabetes mellitus and hypertension.  Use of agents associated with hypertension: none.    ------------------------------------------------------------------------   Allergies  Allergen Reactions  . Beta Adrenergic Blockers     Chest pressure/difficulty breathing  . Levaquin [Levofloxacin In D5w]     Low blood sugar; does take this when she needs it. She just makes sure to control her BS to avoid hypoglycemia.  . Levofloxacin   . Lisinopril Cough  . Prevnar [Pneumococcal 13-Val Conj Vacc] Rash    Joint Pain  . Sulfa Antibiotics Rash    "makes my skin raw"     Current Outpatient Medications:  .  amiloride-hydrochlorothiazide (MODURETIC) 5-50 MG tablet, TAKE 1/2 TABLET BY MOUTH DAILY AS NEEDED FOR SWELLING, Disp: 45 tablet, Rfl: 2 .  amLODipine (NORVASC) 5 MG tablet, TAKE 1 TABLET(5 MG) BY MOUTH DAILY, Disp: 90 tablet, Rfl: 2 .  aspirin 81 MG EC tablet, Take 81 mg by mouth daily.  , Disp: , Rfl:  .  Bioflavonoid Products (ESTER C PO), Take 500 mg by mouth daily. occassionally takes 2 tablets, Disp: , Rfl:  .  Cyanocobalamin (VITAMIN B-12) 1000 MCG/15ML LIQD, Take 2,500 mcg by mouth daily. , Disp: , Rfl:  .  glucose blood (FREESTYLE TEST STRIPS) test strip, Use as instructed, Disp: 100 each, Rfl: 12 .  Ibuprofen (ADVIL) 200 MG CAPS, Take 1 capsule by mouth daily.  , Disp: , Rfl:  .  latanoprost (XALATAN) 0.005 % ophthalmic solution, Reported on 10/12/2015 - both eyes nightly, Disp: , Rfl: 3 .  losartan (COZAAR) 100 MG tablet, TAKE 1 TABLET BY MOUTH EVERY DAY, Disp: 90 tablet, Rfl: 2 .  MAGNESIUM PO, Take 250 mg by mouth. Every 2-3 days, Disp: ,  Rfl:  .  metFORMIN (GLUCOPHAGE) 500 MG tablet, TAKE 2 TABLETS(1000 MG) BY MOUTH TWICE DAILY WITH MEALS, Disp: 360 tablet, Rfl: 3 .  Baclofen 5 MG TABS, Take 10 mg by mouth 3 (three) times daily as needed. (Patient not taking: Reported on 07/02/2018), Disp: 90 tablet, Rfl: 1 .  empagliflozin (JARDIANCE) 10 MG TABS tablet, Take 10 mg by mouth daily before breakfast., Disp: 30 tablet, Rfl: 3  Review of Systems  Constitutional: Negative.    Respiratory: Negative.   Cardiovascular: Negative.   Neurological: Negative.   Hematological: Negative.     Social History   Tobacco Use  . Smoking status: Former Smoker    Packs/day: 1.00    Years: 15.00    Pack years: 15.00    Types: Cigarettes    Quit date: 08/03/2003    Years since quitting: 15.3  . Smokeless tobacco: Never Used  Substance Use Topics  . Alcohol use: No      Objective:   BP (!) 168/76 (BP Location: Right Arm, Patient Position: Sitting, Cuff Size: Normal)   Pulse 74   Temp 98.3 F (36.8 C) (Oral)   Wt 143 lb 3.2 oz (65 kg)   SpO2 98%   BMI 25.37 kg/m  Vitals:   12/13/18 0855 12/13/18 0936  BP: (!) 162/74 (!) 168/76  Pulse: 74   Temp: 98.3 F (36.8 C)   TempSrc: Oral   SpO2: 98%   Weight: 143 lb 3.2 oz (65 kg)      Physical Exam Vitals signs reviewed.  Constitutional:      General: She is not in acute distress.    Appearance: Normal appearance. She is well-developed. She is not diaphoretic.  HENT:     Head: Normocephalic and atraumatic.  Eyes:     General: No scleral icterus.    Conjunctiva/sclera: Conjunctivae normal.  Neck:     Musculoskeletal: Neck supple.     Thyroid: No thyromegaly.  Cardiovascular:     Rate and Rhythm: Normal rate and regular rhythm.     Pulses: Normal pulses.     Heart sounds: Normal heart sounds. No murmur.  Pulmonary:     Effort: Pulmonary effort is normal. No respiratory distress.     Breath sounds: Normal breath sounds. No wheezing, rhonchi or rales.  Musculoskeletal:     Right lower leg: No edema.     Left lower leg: No edema.  Lymphadenopathy:     Cervical: No cervical adenopathy.  Skin:    General: Skin is warm and dry.     Capillary Refill: Capillary refill takes less than 2 seconds.     Findings: No rash.  Neurological:     Mental Status: She is alert and oriented to person, place, and time. Mental status is at baseline.  Psychiatric:        Mood and Affect: Mood normal.        Behavior:  Behavior normal.      Results for orders placed or performed in visit on 12/13/18  POCT glycosylated hemoglobin (Hb A1C)  Result Value Ref Range   Hemoglobin A1C 7.0 (A) 4.0 - 5.6 %   HbA1c POC (<> result, manual entry)     HbA1c, POC (prediabetic range)     HbA1c, POC (controlled diabetic range)     Est. average glucose Bld gHb Est-mCnc 154        Assessment & Plan   Problem List Items Addressed This Visit      Cardiovascular and Mediastinum  HTN (hypertension)    Uncontrolled, but has not had her medications yet this AM Home BP well controled Asymptomatic Recheck metabolic panel at next visit Continue current medications        Endocrine   DM (diabetes mellitus) (HCC) - Primary    Previously well controlled, but A1c has continued to slowly elevate Discussed goal of A1c less than 7 or less than 7.5, but as she does not have many comorbidities, we will try for an A1c of less than 7 Continue metformin at current dose Start Jardiance 10 mg daily Discussed possible side effects Up-to-date on screenings and vaccinations Follow-up in 3 months      Relevant Medications   empagliflozin (JARDIANCE) 10 MG TABS tablet   Other Relevant Orders   POCT glycosylated hemoglobin (Hb A1C) (Completed)       Return in about 3 months (around 03/15/2019) for chronic disease f/u.   The entirety of the information documented in the History of Present Illness, Review of Systems and Physical Exam were personally obtained by me. Portions of this information were initially documented by [facility] McClurkin, CMA and reviewed by me for thoroughness and accuracy.    Angeline Trick, Marzella SchleinAngela M, MD MPH [facility]

## 2018-12-13 ENCOUNTER — Encounter: Payer: Self-pay | Admitting: Family Medicine

## 2018-12-13 ENCOUNTER — Telehealth: Payer: Self-pay | Admitting: Family Medicine

## 2018-12-13 ENCOUNTER — Ambulatory Visit (INDEPENDENT_AMBULATORY_CARE_PROVIDER_SITE_OTHER): Payer: PPO | Admitting: Family Medicine

## 2018-12-13 ENCOUNTER — Other Ambulatory Visit: Payer: Self-pay

## 2018-12-13 VITALS — BP 168/76 | HR 74 | Temp 98.3°F | Wt 143.2 lb

## 2018-12-13 DIAGNOSIS — E1159 Type 2 diabetes mellitus with other circulatory complications: Secondary | ICD-10-CM | POA: Diagnosis not present

## 2018-12-13 DIAGNOSIS — I10 Essential (primary) hypertension: Secondary | ICD-10-CM | POA: Diagnosis not present

## 2018-12-13 LAB — POCT GLYCOSYLATED HEMOGLOBIN (HGB A1C)
Est. average glucose Bld gHb Est-mCnc: 154
Hemoglobin A1C: 7 % — AB (ref 4.0–5.6)

## 2018-12-13 MED ORDER — EMPAGLIFLOZIN 10 MG PO TABS: 10.0000 mg | ORAL_TABLET | Freq: Every day | ORAL | 3 refills | Status: DC

## 2018-12-13 NOTE — Telephone Encounter (Signed)
Pt was in this morning and seen Dr. Jacinto Reap.  She was prescribed Jardiance.  She had it sent to [facility].  She said it was too expensive.    She wants to know if Dr. Jacinto Reap will send it to [facility]  CB#  [phone]   Con [place]

## 2018-12-13 NOTE — Assessment & Plan Note (Signed)
Uncontrolled, but has not had her medications yet this AM Home BP well controled Asymptomatic Recheck metabolic panel at next visit Continue current medications

## 2018-12-13 NOTE — Assessment & Plan Note (Addendum)
Previously well controlled, but A1c has continued to slowly elevate Discussed goal of A1c less than 7 or less than 7.5, but as she does not have many comorbidities, we will try for an A1c of less than 7 Continue metformin at current dose Start Jardiance 10 mg daily Discussed possible side effects Up-to-date on screenings and vaccinations Follow-up in 3 months

## 2018-12-13 NOTE — Telephone Encounter (Signed)
Noted. Please let me know when forms need to be completed.

## 2018-12-13 NOTE — Patient Instructions (Signed)
Start Jardiance in addition to Metformin

## 2018-12-13 NOTE — Telephone Encounter (Signed)
Patient states her insurance company will be sending some forms to be filled out to get the medication at a reduced cost.

## 2019-01-03 ENCOUNTER — Ambulatory Visit: Payer: Self-pay | Admitting: Family Medicine

## 2019-02-04 DIAGNOSIS — H66002 Acute suppurative otitis media without spontaneous rupture of ear drum, left ear: Secondary | ICD-10-CM | POA: Diagnosis not present

## 2019-02-04 DIAGNOSIS — H6501 Acute serous otitis media, right ear: Secondary | ICD-10-CM | POA: Diagnosis not present

## 2019-02-04 DIAGNOSIS — H7202 Central perforation of tympanic membrane, left ear: Secondary | ICD-10-CM | POA: Diagnosis not present

## 2019-02-12 ENCOUNTER — Ambulatory Visit (INDEPENDENT_AMBULATORY_CARE_PROVIDER_SITE_OTHER): Payer: PPO | Admitting: Family Medicine

## 2019-02-12 ENCOUNTER — Encounter: Payer: Self-pay | Admitting: Family Medicine

## 2019-02-12 ENCOUNTER — Other Ambulatory Visit: Payer: Self-pay

## 2019-02-12 ENCOUNTER — Ambulatory Visit: Payer: PPO

## 2019-02-12 VITALS — BP 161/71 | HR 87 | Temp 96.8°F | Wt 143.2 lb

## 2019-02-12 DIAGNOSIS — H7292 Unspecified perforation of tympanic membrane, left ear: Secondary | ICD-10-CM | POA: Diagnosis not present

## 2019-02-12 DIAGNOSIS — H9213 Otorrhea, bilateral: Secondary | ICD-10-CM | POA: Diagnosis not present

## 2019-02-12 NOTE — Progress Notes (Signed)
Patient: Jessica Wheeler Female    DOB: 1941-01-15   78 y.o.   MRN: [id number] Visit Date: 02/12/2019  Today's Provider: Lavon Paganini, MD   Chief Complaint  Patient presents with  . Ear Pain   Subjective:    I, Porsha McClurkin CMA, am acting as a scribe for Lavon Paganini, MD.    Otalgia  There is pain in both ears. This is a recurrent problem. The problem has been unchanged. There has been no fever. Associated symptoms include ear discharge and hearing loss. She has tried ear drops for the symptoms.  Patient states that her ENT doctor is not helping her and she does not want to see him.  Was told that L eardrum had a hole in it and put her on prednisone and ofloxacin drops. No change in drainage after cerumen disimpaction or medications. She has avoided getting water in her ear.  Drainage is yellow in color, malodorous, and sometimes bloody. Covers pillow overnight. Brings tissue covered in drainage from this morning.    Allergies  Allergen Reactions  . Beta Adrenergic Blockers     Chest pressure/difficulty breathing  . Levaquin [Levofloxacin In D5w]     Low blood sugar; does take this when she needs it. She just makes sure to control her BS to avoid hypoglycemia.  . Levofloxacin   . Lisinopril Cough  . Prevnar [Pneumococcal 13-Val Conj Vacc] Rash    Joint Pain  . Sulfa Antibiotics Rash    "makes my skin raw"     Current Outpatient Medications:  .  amiloride-hydrochlorothiazide (MODURETIC) 5-50 MG tablet, TAKE 1/2 TABLET BY MOUTH DAILY AS NEEDED FOR SWELLING, Disp: 45 tablet, Rfl: 2 .  amLODipine (NORVASC) 5 MG tablet, TAKE 1 TABLET(5 MG) BY MOUTH DAILY, Disp: 90 tablet, Rfl: 2 .  aspirin 81 MG EC tablet, Take 81 mg by mouth daily.  , Disp: , Rfl:  .  Baclofen 5 MG TABS, Take 10 mg by mouth 3 (three) times daily as needed., Disp: 90 tablet, Rfl: 1 .  Bioflavonoid Products (ESTER C PO), Take 500 mg by mouth daily. occassionally takes 2 tablets, Disp: , Rfl:   .  Cyanocobalamin (VITAMIN B-12) 1000 MCG/15ML LIQD, Take 2,500 mcg by mouth daily. , Disp: , Rfl:  .  glucose blood (FREESTYLE TEST STRIPS) test strip, Use as instructed, Disp: 100 each, Rfl: 12 .  Ibuprofen (ADVIL) 200 MG CAPS, Take 1 capsule by mouth daily.  , Disp: , Rfl:  .  latanoprost (XALATAN) 0.005 % ophthalmic solution, Reported on 10/12/2015 - both eyes nightly, Disp: , Rfl: 3 .  losartan (COZAAR) 100 MG tablet, TAKE 1 TABLET BY MOUTH EVERY DAY, Disp: 90 tablet, Rfl: 2 .  MAGNESIUM PO, Take 250 mg by mouth. Every 2-3 days, Disp: , Rfl:  .  metFORMIN (GLUCOPHAGE) 500 MG tablet, TAKE 2 TABLETS(1000 MG) BY MOUTH TWICE DAILY WITH MEALS, Disp: 360 tablet, Rfl: 3 .  empagliflozin (JARDIANCE) 10 MG TABS tablet, Take 10 mg by mouth daily before breakfast., Disp: 30 tablet, Rfl: 3  Review of Systems  Constitutional: Negative.   HENT: Positive for ear discharge, ear pain and hearing loss.   Respiratory: Negative.   Neurological: Positive for dizziness and light-headedness.    Social History   Tobacco Use  . Smoking status: Former Smoker    Packs/day: 1.00    Years: 15.00    Pack years: 15.00    Types: Cigarettes    Quit date:  08/03/2003    Years since quitting: 15.5  . Smokeless tobacco: Never Used  Substance Use Topics  . Alcohol use: No      Objective:   BP (!) 161/71 (BP Location: Left Arm, Patient Position: Sitting, Cuff Size: Normal)   Pulse 87   Temp (!) 96.8 F (36 C) (Temporal)   Wt 143 lb 3.2 oz (65 kg)   SpO2 97%   BMI 25.37 kg/m  Vitals:   02/12/19 1029  BP: (!) 161/71  Pulse: 87  Temp: (!) 96.8 F (36 C)  TempSrc: Temporal  SpO2: 97%  Weight: 143 lb 3.2 oz (65 kg)  Body mass index is 25.37 kg/m.   Physical Exam Vitals signs reviewed.  Constitutional:      General: She is not in acute distress.    Appearance: Normal appearance.  HENT:     Head: Normocephalic and atraumatic.     Right Ear: Tympanic membrane, ear canal and external ear normal.  There is no impacted cerumen.     Left Ear: Ear canal and external ear normal. There is no impacted cerumen.     Ears:     Comments: Small perforation in lower TM and white area in upper TM. Drainage noted through perforation Eyes:     General: No scleral icterus.    Conjunctiva/sclera: Conjunctivae normal.  Neck:     Musculoskeletal: Neck supple.  Cardiovascular:     Rate and Rhythm: Normal rate and regular rhythm.  Pulmonary:     Effort: Pulmonary effort is normal. No respiratory distress.  Lymphadenopathy:     Cervical: No cervical adenopathy.  Neurological:     Mental Status: She is alert.      No results found for any visits on 02/12/19.     Assessment & Plan   1. Otorrhea of both ears 2. Perforation of left tympanic membrane - has undergone several rounds of prednisone and ofloxacin drops - evidence of draining perfration of L ear drum on exam - will refer ot different ENT for second opinion - Ambulatory referral to ENT   Return if symptoms worsen or fail to improve.   The entirety of the information documented in the History of Present Illness, Review of Systems and Physical Exam were personally obtained by me. Portions of this information were initially documented by [facility], CMA and reviewed by me for thoroughness and accuracy.    Kielee Care, Marzella Schlein, MD MPH [facility]

## 2019-02-12 NOTE — Patient Instructions (Signed)
Ear Drainage Ear drainage is the discharge of ear wax, pus, blood, or other fluids from the ear. Follow these instructions at home: Pay attention to changes in your ear drainage. Report them to your health care provider. Follow these instructions to relieve your symptoms. Protecting your ear   Do not use cotton-tipped swabs in your ear. Do not put any other objects into your ear.  Do not swim until your health care provider has approved.  Before you shower, cover a cotton ball with petroleum jelly and place it in your ear. This helps to keep water out.  Wash your hands before and after you touch your ears. General instructions  Take over-the-counter and prescription medicines only as told by your health care provider.  Avoid any exposure to smoke.  Keep all follow-up visits as told by your health care provider. This is important. Contact a health care provider if:  You have increased drainage.  You have ear pain.  You have a fever.  Your drainage is not getting better with treatment.  Your ear drainage is bloody, white, clear, or yellow.  Your ear is red or swollen. Get help right away if you:  Have severe ear pain.  Have a severe headache.  Vomit.  Feel dizzy.  Have a seizure.  Have new hearing loss. Summary  Ear drainage is the discharge of ear wax, pus, blood, or other fluids from the ear.  Pay attention to any changes in your symptoms. Tell your health care provider about them. Follow instructions from your health care provider.  Contact your health care provider if you have more drainage, bloody drainage, ear pain, fever, or swelling.  Get help right away if you have severe pain, severe headache, vomiting, dizziness, seizure, or hearing loss. This information is not intended to replace advice given to you by your health care provider. Make sure you discuss any questions you have with your health care provider. Document Released: 04/18/2005 Document  Revised: 10/19/2017 Document Reviewed: 10/19/2017 Elsevier Patient Education  2020 Elsevier Inc.  

## 2019-02-25 DIAGNOSIS — J301 Allergic rhinitis due to pollen: Secondary | ICD-10-CM | POA: Diagnosis not present

## 2019-02-25 DIAGNOSIS — H6503 Acute serous otitis media, bilateral: Secondary | ICD-10-CM | POA: Diagnosis not present

## 2019-02-26 ENCOUNTER — Ambulatory Visit (INDEPENDENT_AMBULATORY_CARE_PROVIDER_SITE_OTHER): Payer: PPO

## 2019-02-26 ENCOUNTER — Other Ambulatory Visit: Payer: Self-pay

## 2019-02-26 DIAGNOSIS — Z23 Encounter for immunization: Secondary | ICD-10-CM | POA: Diagnosis not present

## 2019-03-14 ENCOUNTER — Other Ambulatory Visit: Payer: Self-pay | Admitting: Family Medicine

## 2019-03-14 DIAGNOSIS — Z1231 Encounter for screening mammogram for malignant neoplasm of breast: Secondary | ICD-10-CM

## 2019-03-14 DIAGNOSIS — E1169 Type 2 diabetes mellitus with other specified complication: Secondary | ICD-10-CM

## 2019-03-14 MED ORDER — METFORMIN HCL 500 MG PO TABS: ORAL_TABLET | ORAL | 3 refills | Status: DC

## 2019-03-14 NOTE — Telephone Encounter (Signed)
[facility] faxed refill request for the following medications:   metFORMIN (GLUCOPHAGE) 500 MG tablet    Please advise.  

## 2019-03-15 ENCOUNTER — Encounter: Payer: Self-pay | Admitting: Family Medicine

## 2019-03-15 ENCOUNTER — Ambulatory Visit (INDEPENDENT_AMBULATORY_CARE_PROVIDER_SITE_OTHER): Payer: PPO | Admitting: Family Medicine

## 2019-03-15 ENCOUNTER — Other Ambulatory Visit: Payer: Self-pay

## 2019-03-15 VITALS — BP 125/64 | HR 71 | Temp 97.2°F | Resp 16 | Wt 142.0 lb

## 2019-03-15 DIAGNOSIS — I10 Essential (primary) hypertension: Secondary | ICD-10-CM | POA: Diagnosis not present

## 2019-03-15 DIAGNOSIS — E1159 Type 2 diabetes mellitus with other circulatory complications: Secondary | ICD-10-CM | POA: Diagnosis not present

## 2019-03-15 DIAGNOSIS — E1169 Type 2 diabetes mellitus with other specified complication: Secondary | ICD-10-CM | POA: Diagnosis not present

## 2019-03-15 DIAGNOSIS — E785 Hyperlipidemia, unspecified: Secondary | ICD-10-CM | POA: Diagnosis not present

## 2019-03-15 LAB — POCT GLYCOSYLATED HEMOGLOBIN (HGB A1C)
Est. average glucose Bld gHb Est-mCnc: 160
Hemoglobin A1C: 7.2 % — AB (ref 4.0–5.6)

## 2019-03-15 MED ORDER — EMPAGLIFLOZIN 10 MG PO TABS: 10.0000 mg | ORAL_TABLET | Freq: Every day | ORAL | 3 refills | Status: DC

## 2019-03-15 NOTE — Progress Notes (Signed)
Patient: Jessica Wheeler Female    DOB: [birth year]   78 y.o.   MRN: [id number] Visit Date: 03/15/2019  Today's Provider: Shirlee LatchAngela Julieann Drummonds, MD   Chief Complaint  Patient presents with  . Diabetes  . Hypertension   Subjective:     HPI  Diabetes Mellitus Type II, Follow-up:   Lab Results  Component Value Date   HGBA1C 7.2 (A) 03/15/2019   HGBA1C 7.0 (A) 12/13/2018   HGBA1C 6.8 (H) 07/04/2018    Last seen for diabetes 3 months ago.  Management since then includes continue metformin and start Jardiance 10 mg daily. She reports poor compliance with treatment. Patient reports she did not start taking Jardiance. She is not having side effects.  Current symptoms include none and have been stable. Home blood sugar records: fasting range: 120-150  Episodes of hypoglycemia? yes -    Current insulin regiment: Is not on insulin Most Recent Eye Exam: 10/2018 Weight trend: stable Prior visit with dietician: No Current exercise: walking Current diet habits: well balanced  Pertinent Labs:    Component Value Date/Time   CHOL 178 07/04/2018 0820   TRIG 81 07/04/2018 0820   HDL 82 07/04/2018 0820   LDLCALC 80 07/04/2018 0820   CREATININE 0.69 07/04/2018 0820   CREATININE 0.72 10/01/2011 1322    Wt Readings from Last 3 Encounters:  03/15/19 142 lb (64.4 kg)  02/12/19 143 lb 3.2 oz (65 kg)  12/13/18 143 lb 3.2 oz (65 kg)    ------------------------------------------------------------------------  Hypertension, follow-up:  BP Readings from Last 3 Encounters:  03/15/19 125/64  02/12/19 (!) 161/71  12/13/18 (!) 168/76    She was last seen for hypertension 3 months ago.  BP at that visit was 168/76. Management changes since that visit include no changes. She reports excellent compliance with treatment. She is not having side effects.  She is exercising. She is adherent to low salt diet.   Outside blood pressures are stable. She is experiencing none.  Patient  denies chest pain, chest pressure/discomfort and lower extremity edema.   Cardiovascular risk factors include advanced age (older than 6155 for men, 4265 for women), diabetes mellitus and hypertension.  Use of agents associated with hypertension: none.     Weight trend: stable Wt Readings from Last 3 Encounters:  03/15/19 142 lb (64.4 kg)  02/12/19 143 lb 3.2 oz (65 kg)  12/13/18 143 lb 3.2 oz (65 kg)    Current diet: well balanced  ------------------------------------------------------------------------   Allergies  Allergen Reactions  . Beta Adrenergic Blockers     Chest pressure/difficulty breathing  . Levaquin [Levofloxacin In D5w]     Low blood sugar; does take this when she needs it. She just makes sure to control her BS to avoid hypoglycemia.  . Levofloxacin   . Lisinopril Cough  . Prevnar [Pneumococcal 13-Val Conj Vacc] Rash    Joint Pain  . Sulfa Antibiotics Rash    "makes my skin raw"     Current Outpatient Medications:  .  amiloride-hydrochlorothiazide (MODURETIC) 5-50 MG tablet, TAKE 1/2 TABLET BY MOUTH DAILY AS NEEDED FOR SWELLING, Disp: 45 tablet, Rfl: 2 .  amLODipine (NORVASC) 5 MG tablet, TAKE 1 TABLET(5 MG) BY MOUTH DAILY, Disp: 90 tablet, Rfl: 2 .  aspirin 81 MG EC tablet, Take 81 mg by mouth daily.  , Disp: , Rfl:  .  Baclofen 5 MG TABS, Take 10 mg by mouth 3 (three) times daily as needed., Disp: 90 tablet, Rfl: 1 .  Bioflavonoid Products (ESTER C PO), Take 500 mg by mouth daily. occassionally takes 2 tablets, Disp: , Rfl:  .  Cyanocobalamin (VITAMIN B-12) 1000 MCG/15ML LIQD, Take 2,500 mcg by mouth daily. , Disp: , Rfl:  .  glucose blood (FREESTYLE TEST STRIPS) test strip, Use as instructed, Disp: 100 each, Rfl: 12 .  Ibuprofen (ADVIL) 200 MG CAPS, Take 1 capsule by mouth daily.  , Disp: , Rfl:  .  latanoprost (XALATAN) 0.005 % ophthalmic solution, Reported on 10/12/2015 - both eyes nightly, Disp: , Rfl: 3 .  losartan (COZAAR) 100 MG tablet, TAKE 1 TABLET BY  MOUTH EVERY DAY, Disp: 90 tablet, Rfl: 2 .  MAGNESIUM PO, Take 250 mg by mouth. Every 2-3 days, Disp: , Rfl:  .  metFORMIN (GLUCOPHAGE) 500 MG tablet, TAKE 2 TABLETS(1000 MG) BY MOUTH TWICE DAILY WITH MEALS, Disp: 360 tablet, Rfl: 3  Review of Systems  Constitutional: Negative.   Eyes: Negative.   Cardiovascular: Negative.   Endocrine: Negative.     Social History   Tobacco Use  . Smoking status: Former Smoker    Packs/day: 1.00    Years: 15.00    Pack years: 15.00    Types: Cigarettes    Quit date: 08/03/2003    Years since quitting: 15.6  . Smokeless tobacco: Never Used  Substance Use Topics  . Alcohol use: No      Objective:   BP 125/64 (BP Location: Left Arm, Patient Position: Sitting, Cuff Size: Normal)   Pulse 71   Temp (!) 97.2 F (36.2 C) (Temporal)   Resp 16   Wt 142 lb (64.4 kg)   BMI 25.15 kg/m  Vitals:   03/15/19 0854  BP: 125/64  Pulse: 71  Resp: 16  Temp: (!) 97.2 F (36.2 C)  TempSrc: Temporal  Weight: 142 lb (64.4 kg)  Body mass index is 25.15 kg/m.   Physical Exam Vitals signs reviewed.  Constitutional:      General: She is not in acute distress.    Appearance: Normal appearance. She is well-developed. She is not diaphoretic.  HENT:     Head: Normocephalic and atraumatic.  Eyes:     General: No scleral icterus.    Conjunctiva/sclera: Conjunctivae normal.  Neck:     Musculoskeletal: Neck supple.     Thyroid: No thyromegaly.  Cardiovascular:     Rate and Rhythm: Normal rate and regular rhythm.     Pulses: Normal pulses.     Heart sounds: Normal heart sounds. No murmur.  Pulmonary:     Effort: Pulmonary effort is normal. No respiratory distress.     Breath sounds: Normal breath sounds. No wheezing, rhonchi or rales.  Musculoskeletal:     Right lower leg: No edema.     Left lower leg: No edema.  Lymphadenopathy:     Cervical: No cervical adenopathy.  Skin:    General: Skin is warm and dry.     Capillary Refill: Capillary refill  takes less than 2 seconds.     Findings: No rash.  Neurological:     Mental Status: She is alert and oriented to person, place, and time. Mental status is at baseline.  Psychiatric:        Mood and Affect: Mood normal.        Behavior: Behavior normal.      Results for orders placed or performed in visit on 03/15/19  POCT HgB A1C  Result Value Ref Range   Hemoglobin A1C 7.2 (A) 4.0 - 5.6 %  Est. average glucose Bld gHb Est-mCnc 160        Assessment & Plan   Problem List Items Addressed This Visit      Cardiovascular and Mediastinum   HTN (hypertension)    Well controlled Continue current medications Recheck metabolic panel F/u in 6 months       Relevant Orders   CMP (Comprehensive metabolic panel)     Endocrine   DM (diabetes mellitus) (HCC) - Primary    Uncontrolled and A1c raising Continue metformin at current dose Start Jardiance 10mg  daily - was ordered last visit, but patient has not started yet Discussed possible side effects UTD on screenings and vaccinations F/u in 3 months      Relevant Medications   empagliflozin (JARDIANCE) 10 MG TABS tablet   Other Relevant Orders   POCT HgB A1C (Completed)   Hyperlipidemia associated with type 2 diabetes mellitus (HCC)    Recheck CMP and FLP. She has been hesitant to take statin Also followed by cardiology Discussed importance of statins and possible side effects. She may reconsider      Relevant Medications   empagliflozin (JARDIANCE) 10 MG TABS tablet   Other Relevant Orders   CMP (Comprehensive metabolic panel)   Lipid panel       Return in about 4 months (around 07/13/2019) for CPE/AWV, as scheduled.   The entirety of the information documented in the History of Present Illness, Review of Systems and Physical Exam were personally obtained by me. Portions of this information were initially documented by 07/15/2019, CMA and reviewed by me for thoroughness and accuracy.    Jessica Wheeler, Rondel Baton,  MD MPH [facility]

## 2019-03-15 NOTE — Assessment & Plan Note (Signed)
Uncontrolled and A1c raising Continue metformin at current dose Start Jardiance 10mg  daily - was ordered last visit, but patient has not started yet Discussed possible side effects UTD on screenings and vaccinations F/u in 3 months

## 2019-03-15 NOTE — Assessment & Plan Note (Signed)
Well controlled Continue current medications Recheck metabolic panel F/u in 6 months  

## 2019-03-15 NOTE — Assessment & Plan Note (Signed)
Recheck CMP and FLP. She has been hesitant to take statin Also followed by cardiology Discussed importance of statins and possible side effects. She may reconsider

## 2019-03-16 LAB — COMPREHENSIVE METABOLIC PANEL
ALT: 33 IU/L — ABNORMAL HIGH (ref 0–32)
AST: 32 IU/L (ref 0–40)
Albumin/Globulin Ratio: 2.2 (ref 1.2–2.2)
Albumin: 4.7 g/dL (ref 3.7–4.7)
Alkaline Phosphatase: 50 IU/L (ref 39–117)
BUN/Creatinine Ratio: 21 (ref 12–28)
BUN: 14 mg/dL (ref 8–27)
Bilirubin Total: 0.5 mg/dL (ref 0.0–1.2)
CO2: 19 mmol/L — ABNORMAL LOW (ref 20–29)
Calcium: 9.6 mg/dL (ref 8.7–10.3)
Chloride: 93 mmol/L — ABNORMAL LOW (ref 96–106)
Creatinine, Ser: 0.66 mg/dL (ref 0.57–1.00)
GFR calc Af Amer: 96 mL/min/{1.73_m2} (ref >59)
GFR calc non Af Amer: 84 mL/min/{1.73_m2} (ref >59)
Globulin, Total: 2.1 g/dL (ref 1.5–4.5)
Glucose: 140 mg/dL — ABNORMAL HIGH (ref 65–99)
Potassium: 4 mmol/L (ref 3.5–5.2)
Sodium: 130 mmol/L — ABNORMAL LOW (ref 134–144)
Total Protein: 6.8 g/dL (ref 6.0–8.5)

## 2019-03-16 LAB — LIPID PANEL
Chol/HDL Ratio: 2.1 ratio (ref 0.0–4.4)
Cholesterol, Total: 186 mg/dL (ref 100–199)
HDL: 89 mg/dL (ref >39)
LDL Chol Calc (NIH): 81 mg/dL (ref 0–99)
Triglycerides: 88 mg/dL (ref 0–149)
VLDL Cholesterol Cal: 16 mg/dL (ref 5–40)

## 2019-03-18 DIAGNOSIS — H401131 Primary open-angle glaucoma, bilateral, mild stage: Secondary | ICD-10-CM | POA: Diagnosis not present

## 2019-03-18 DIAGNOSIS — H43813 Vitreous degeneration, bilateral: Secondary | ICD-10-CM | POA: Diagnosis not present

## 2019-03-18 DIAGNOSIS — Z7984 Long term (current) use of oral hypoglycemic drugs: Secondary | ICD-10-CM | POA: Diagnosis not present

## 2019-03-18 DIAGNOSIS — E119 Type 2 diabetes mellitus without complications: Secondary | ICD-10-CM | POA: Diagnosis not present

## 2019-03-18 DIAGNOSIS — Z961 Presence of intraocular lens: Secondary | ICD-10-CM | POA: Diagnosis not present

## 2019-03-19 ENCOUNTER — Telehealth: Payer: Self-pay

## 2019-03-19 ENCOUNTER — Ambulatory Visit: Payer: Self-pay

## 2019-03-19 NOTE — Telephone Encounter (Signed)
FYI

## 2019-03-19 NOTE — Telephone Encounter (Signed)
Patient called stating that she has muscle spasms that travel the inner left leg from her hip down.  She states that today it is the left leg and last night the right leg.  She states that she also experiences muscle spasm in her across her entire chest.  She states that her last labs showed her Na is low.  She was in the office on Friday and Dr [name] started her on Jardiance and warned her that her electrolytes be altered. She denies chest pain. She has tried Advil for the pain without relief. She rates her pain as 8-10.  Care advice read to patient. She verbalized understanding. Call transferred to office for scheduling.  Reason for Disposition . [1] SEVERE pain (e.g., excruciating, unable to do any normal activities) AND [2] not improved 2 hours after pain medicine  Answer Assessment - Initial Assessment Questions 1. ONSET: "When did the muscle aches or body pains start?"      left Leg innerdown to foot  Hip Rt legs as well 2. LOCATION: "What part of your body is hurting?" (e.g., entire body, arms, legs)      Legs hips chest 3. SEVERITY: "How bad is the pain?" (Scale 1-10; or mild, moderate, severe)   - MILD (1-3): doesn't interfere with normal activities    - MODERATE (4-7): interferes with normal activities or awakens from sleep    - SEVERE (8-10):  excruciating pain, unable to do any normal activities      8-10 4. CAUSE: "What do you think is causing the pains?"     medication 5. FEVER: "Have you been having fever?"    fever 6. OTHER SYMPTOMS: "Do you have any other symptoms?" (e.g., chest pain, weakness, rash, cold or flu symptoms, weight loss)    Tightening chest  7. PREGNANCY: "Is there any chance you are pregnant?" "When was your last menstrual period?"    N/A 8. TRAVEL: "Have you traveled out of the country in the last month?" (e.g., travel history, exposures)    no  Protocols used: MUSCLE ACHES AND BODY PAIN-A-AH

## 2019-03-19 NOTE — Telephone Encounter (Signed)
-----   Message from Virginia Crews, MD sent at 03/19/2019  8:29 AM EST ----- Normal/stable labs.  Cholesterol is not to goal in the setting of diabetes.  To reduce risk of heart disease and stroke as we discussed, I would recommend a low-dose statin.  We could even just do this every other day or 3 times a week if patient would like

## 2019-03-19 NOTE — Telephone Encounter (Signed)
Patient advised as below. Patient reports that she already suffers from muscle spasms, but is willing to take medication that will not affect her muscles. Please advise.

## 2019-03-19 NOTE — Telephone Encounter (Signed)
appt 03/20/2019

## 2019-03-20 ENCOUNTER — Ambulatory Visit (INDEPENDENT_AMBULATORY_CARE_PROVIDER_SITE_OTHER): Payer: PPO | Admitting: Family Medicine

## 2019-03-20 ENCOUNTER — Other Ambulatory Visit: Payer: Self-pay

## 2019-03-20 ENCOUNTER — Encounter: Payer: Self-pay | Admitting: Family Medicine

## 2019-03-20 VITALS — BP 159/76 | HR 94 | Temp 96.9°F | Resp 16 | Wt 142.0 lb

## 2019-03-20 DIAGNOSIS — M62838 Other muscle spasm: Secondary | ICD-10-CM | POA: Diagnosis not present

## 2019-03-20 DIAGNOSIS — R252 Cramp and spasm: Secondary | ICD-10-CM | POA: Diagnosis not present

## 2019-03-20 DIAGNOSIS — M545 Low back pain: Secondary | ICD-10-CM | POA: Diagnosis not present

## 2019-03-20 MED ORDER — CYCLOBENZAPRINE HCL 5 MG PO TABS: 5.0000 mg | ORAL_TABLET | Freq: Three times a day (TID) | ORAL | 1 refills | Status: DC | PRN

## 2019-03-20 NOTE — Patient Instructions (Signed)
Leg Cramps Leg cramps occur when one or more muscles tighten and you have no control over this tightening (involuntary muscle contraction). Muscle cramps can develop in any muscle, but the most common place is in the calf muscles of the leg. Those cramps can occur during exercise or when you are at rest. Leg cramps are painful, and they may last for a few seconds to a few minutes. Cramps may return several times before they finally stop. Usually, leg cramps are not caused by a serious medical problem. In many cases, the cause is not known. Some common causes include:  Excessive physical effort (overexertion), such as during intense exercise.  Overuse from repetitive motions, or doing the same thing over and over.  Staying in a certain position for a long period of time.  Improper preparation, form, or technique while performing a sport or an activity.  Dehydration.  Injury.  Side effects of certain medicines.  Abnormally low levels of minerals in your blood (electrolytes), especially potassium and calcium. This could result from: ? Pregnancy. ? Taking diuretic medicines. Follow these instructions at home: Eating and drinking  Drink enough fluid to keep your urine pale yellow. Staying hydrated may help prevent cramps.  Eat a healthy diet that includes plenty of nutrients to help your muscles function. A healthy diet includes fruits and vegetables, lean protein, whole grains, and low-fat or nonfat dairy products. Managing pain, stiffness, and swelling      Try massaging, stretching, and relaxing the affected muscle. Do this for several minutes at a time.  If directed, put ice on areas that are sore or painful after a cramp: ? Put ice in a plastic bag. ? Place a towel between your skin and the bag. ? Leave the ice on for 20 minutes, 2-3 times a day.  If directed, apply heat to muscles that are tense or tight. Do this before you exercise, or as often as told by your health care  provider. Use the heat source that your health care provider recommends, such as a moist heat pack or a heating pad. ? Place a towel between your skin and the heat source. ? Leave the heat on for 20-30 minutes. ? Remove the heat if your skin turns bright red. This is especially important if you are unable to feel pain, heat, or cold. You may have a greater risk of getting burned.  Try taking hot showers or baths to help relax tight muscles. General instructions  If you are having frequent leg cramps, avoid intense exercise for several days.  Take over-the-counter and prescription medicines only as told by your health care provider.  Keep all follow-up visits as told by your health care provider. This is important. Contact a health care provider if:  Your leg cramps get more severe or more frequent, or they do not improve over time.  Your foot becomes cold, numb, or blue. Summary  Muscle cramps can develop in any muscle, but the most common place is in the calf muscles of the leg.  Leg cramps are painful, and they may last for a few seconds to a few minutes.  Usually, leg cramps are not caused by a serious medical problem. Often, the cause is not known.  Stay hydrated and take over-the-counter and prescription medicines only as told by your health care provider. This information is not intended to replace advice given to you by your health care provider. Make sure you discuss any questions you have with your health care   provider. Document Released: 05/26/2004 Document Revised: 03/31/2017 Document Reviewed: 01/26/2017 Elsevier Patient Education  2020 [employer] Inc[employer]  

## 2019-03-20 NOTE — Telephone Encounter (Signed)
We can discuss at visit today

## 2019-03-20 NOTE — Progress Notes (Signed)
Patient: Jessica Wheeler Female    DOB: 04/23/1941   78 y.o.   MRN: [id number] Visit Date: 03/20/2019  Today's Provider: Shirlee Latch, MD   Chief Complaint  Patient presents with  . Muscle Pain   Subjective:     HPI Patient here today C/O muscle spasms in her back and legs since Sunday morning (3 days ago). Patient reports that [name] may be causing pain (this was started 11/13). Patient reports pain is present all day and night. Patient reports constant pain with or without activity. Patient reports she has used several OTC medications and creams along with ice pack and heat. Patient reports no symptom control.  She also tried walking and stretching with some relief.    L leg thigh and into groin as well as low back into posterior L leg. Massage helps some   Allergies  Allergen Reactions  . Beta Adrenergic Blockers     Chest pressure/difficulty breathing  . Levaquin [Levofloxacin In D5w]     Low blood sugar; does take this when she needs it. She just makes sure to control her BS to avoid hypoglycemia.  . Levofloxacin   . Lisinopril Cough  . Prevnar [Pneumococcal 13-Val Conj Vacc] Rash    Joint Pain  . Sulfa Antibiotics Rash    "makes my skin raw"     Current Outpatient Medications:  .  amiloride-hydrochlorothiazide (MODURETIC) 5-50 MG tablet, TAKE 1/2 TABLET BY MOUTH DAILY AS NEEDED FOR SWELLING, Disp: 45 tablet, Rfl: 2 .  amLODipine (NORVASC) 5 MG tablet, TAKE 1 TABLET(5 MG) BY MOUTH DAILY, Disp: 90 tablet, Rfl: 2 .  aspirin 81 MG EC tablet, Take 81 mg by mouth daily.  , Disp: , Rfl:  .  Baclofen 5 MG TABS, Take 10 mg by mouth 3 (three) times daily as needed., Disp: 90 tablet, Rfl: 1 .  Bioflavonoid Products (ESTER C PO), Take 500 mg by mouth daily. occassionally takes 2 tablets, Disp: , Rfl:  .  Cyanocobalamin (VITAMIN B-12) 1000 MCG/15ML LIQD, Take 2,500 mcg by mouth daily. , Disp: , Rfl:  .  glucose blood (FREESTYLE TEST STRIPS) test strip, Use as  instructed, Disp: 100 each, Rfl: 12 .  Ibuprofen (ADVIL) 200 MG CAPS, Take 1 capsule by mouth daily.  , Disp: , Rfl:  .  latanoprost (XALATAN) 0.005 % ophthalmic solution, Reported on 10/12/2015 - both eyes nightly, Disp: , Rfl: 3 .  losartan (COZAAR) 100 MG tablet, TAKE 1 TABLET BY MOUTH EVERY DAY, Disp: 90 tablet, Rfl: 2 .  MAGNESIUM PO, Take 250 mg by mouth. Every 2-3 days, Disp: , Rfl:  .  metFORMIN (GLUCOPHAGE) 500 MG tablet, TAKE 2 TABLETS(1000 MG) BY MOUTH TWICE DAILY WITH MEALS, Disp: 360 tablet, Rfl: 3 .  empagliflozin (JARDIANCE) 10 MG TABS tablet, Take 10 mg by mouth daily before breakfast. (Patient not taking: Reported on 03/20/2019), Disp: 30 tablet, Rfl: 3  Review of Systems  Constitutional: Positive for activity change.  Cardiovascular: Negative.   Musculoskeletal: Positive for back pain and myalgias.    Social History   Tobacco Use  . Smoking status: Former Smoker    Packs/day: 1.00    Years: 15.00    Pack years: 15.00    Types: Cigarettes    Quit date: 08/03/2003    Years since quitting: 15.6  . Smokeless tobacco: Never Used  Substance Use Topics  . Alcohol use: No      Objective:   BP (!) 159/76 (BP  Location: Right Arm, Patient Position: Standing, Cuff Size: Normal)   Pulse 94   Temp (!) 96.9 F (36.1 C) (Temporal)   Resp 16   Wt 142 lb (64.4 kg)   BMI 25.15 kg/m  Vitals:   03/20/19 0921  BP: (!) 159/76  Pulse: 94  Resp: 16  Temp: (!) 96.9 F (36.1 C)  TempSrc: Temporal  Weight: 142 lb (64.4 kg)  Body mass index is 25.15 kg/m.   Physical Exam Vitals signs reviewed.  Constitutional:      General: She is not in acute distress.    Appearance: She is well-developed.  HENT:     Head: Normocephalic and atraumatic.  Eyes:     General: No scleral icterus.    Conjunctiva/sclera: Conjunctivae normal.  Cardiovascular:     Rate and Rhythm: Normal rate and regular rhythm.  Pulmonary:     Effort: Pulmonary effort is normal. No respiratory distress.   Musculoskeletal:     Right lower leg: No edema.     Left lower leg: No edema.     Comments: Back: No midline TTP. TTP over L gluteus muscle, IT band, Hamstring and quads. Tightness in these muscles appreciated. No calf TTP, negative Homan's sign. Strength and sensation intact. Negative SLR b/l.  Skin:    General: Skin is warm and dry.     Capillary Refill: Capillary refill takes less than 2 seconds.     Findings: No rash.  Neurological:     Mental Status: She is alert and oriented to person, place, and time.  Psychiatric:        Behavior: Behavior normal.      No results found for any visits on 03/20/19.     Assessment & Plan   1. Leg cramps 2. Muscle spasm - long standing problem with recent worsening since starting Jardiance -Doubt DVT given the lack of calf symptoms and pain and tenderness follows the line of her musculature, but discussed return precautions regarding -Hold Jardiance and diuretic -Concern for possible electrolyte abnormality -Check BMP and magnesium -Can try Flexeril as needed -Typically avoid this in elderly patients, but baclofen is not been giving relief -Advised on heat, massage, stretching -Discussed return precautions  Meds ordered this encounter  Medications  . cyclobenzaprine (FLEXERIL) 5 MG tablet    Sig: Take 1 tablet (5 mg total) by mouth 3 (three) times daily as needed for muscle spasms.    Dispense:  30 tablet    Refill:  1     Return if symptoms worsen or fail to improve.   The entirety of the information documented in the History of Present Illness, Review of Systems and Physical Exam were personally obtained by me. Portions of this information were initially documented by Lynford Humphrey, CMA and reviewed by me for thoroughness and accuracy.    Paitynn Mikus, [name], MD MPH [facility]

## 2019-03-21 ENCOUNTER — Telehealth: Payer: Self-pay

## 2019-03-21 ENCOUNTER — Other Ambulatory Visit: Payer: Self-pay | Admitting: Family Medicine

## 2019-03-21 ENCOUNTER — Other Ambulatory Visit: Payer: Self-pay

## 2019-03-21 ENCOUNTER — Ambulatory Visit
Admission: RE | Admit: 2019-03-21 | Discharge: 2019-03-21 | Disposition: A | Discharge status: Home / Self Care | Payer: PPO | Source: Ambulatory Visit | Attending: Family Medicine | Admitting: Family Medicine

## 2019-03-21 DIAGNOSIS — M79605 Pain in left leg: Secondary | ICD-10-CM | POA: Diagnosis not present

## 2019-03-21 DIAGNOSIS — M79662 Pain in left lower leg: Secondary | ICD-10-CM | POA: Diagnosis not present

## 2019-03-21 LAB — BASIC METABOLIC PANEL WITH GFR
BUN/Creatinine Ratio: 23 (ref 12–28)
BUN: 18 mg/dL (ref 8–27)
CO2: 19 mmol/L — ABNORMAL LOW (ref 20–29)
Calcium: 10.2 mg/dL (ref 8.7–10.3)
Chloride: 92 mmol/L — ABNORMAL LOW (ref 96–106)
Creatinine, Ser: 0.8 mg/dL (ref 0.57–1.00)
GFR calc Af Amer: 81 mL/min/1.73 (ref >59)
GFR calc non Af Amer: 70 mL/min/1.73 (ref >59)
Glucose: 169 mg/dL — ABNORMAL HIGH (ref 65–99)
Potassium: 4 mmol/L (ref 3.5–5.2)
Sodium: 133 mmol/L — ABNORMAL LOW (ref 134–144)

## 2019-03-21 LAB — MAGNESIUM: Magnesium: 2.1 mg/dL (ref 1.6–2.3)

## 2019-03-21 MED ORDER — PREDNISONE 20 MG PO TABS: ORAL_TABLET | ORAL | 0 refills | Status: DC

## 2019-03-21 NOTE — Telephone Encounter (Signed)
Can we add on CBC, ESR and CK to her labs from yesterday? Also let's get an [place] to rule out DVT.  She didn't have any signs of nerve pathology on exam yesterday.

## 2019-03-21 NOTE — Telephone Encounter (Signed)
-----   Message from Virginia Crews, MD sent at 03/21/2019  4:14 PM EST ----- No DVT.  Let us start prednisone for the possibility that this may be nerve pain from the back.  I will send in a prescription that is a burst and taper.  Take prednisone with food and do not take it close to bedtime

## 2019-03-21 NOTE — Telephone Encounter (Signed)
I called patient and advised her of the recent lab results. Patient was seen yesterday for leg pain and was prescribed Flexeril. She states today she is in excruciating pain. She thinks it may be a nerve. Patient wants to know if she could get an x ray or CT of the left leg.  She has tried taking Flexreil, but it has not helped relieve any pain. Please advise.

## 2019-03-21 NOTE — Telephone Encounter (Signed)
Patient advised. Patient would prefer to have an x ray of her back. She sates she doesn't think she made herself clear yesterday about the location of her pain.  She says the pain originates in her back and radiates down her left leg. She says it feels the same as when she had her back surgery in 1982. Patient says an x ray would only cost her $5, an ultrasound will cost $75, and a CT would cost $200. She would like an x ray of her back, but if the CT will give more detail as to what is wrong then she would like to have a CT. Please advise. Do you still wanted us to add CBC, ESR and CK to labs drawn yesterday?  

## 2019-03-21 NOTE — Telephone Encounter (Signed)
I do still want the labs.  We can do back XRay, but it will likely show arthritic changes.  An [place] would rule out DVT. If she still fully believes it is radicular (nerve) pain and not DVT and declines [place], we could try prednisone burst to calm down inflammation.  If she develops any leg swelling or SOB, though, she will need to get seen and get further imaging.

## 2019-03-21 NOTE — Telephone Encounter (Signed)
Patient advised as below and agrees to [place]. Order placed. Labs added. Awaiting appointment time from referral coordinator Sarah. Please schedule.

## 2019-03-21 NOTE — Telephone Encounter (Signed)
Patient advised as below. Patient verbalizes understanding and is in agreement with treatment plan. Patient does want to know what she can do to help with her sugar while she is on prednisone. Please advise.

## 2019-03-22 DIAGNOSIS — M5442 Lumbago with sciatica, left side: Secondary | ICD-10-CM | POA: Diagnosis not present

## 2019-03-22 DIAGNOSIS — M5136 Other intervertebral disc degeneration, lumbar region: Secondary | ICD-10-CM | POA: Diagnosis not present

## 2019-03-22 NOTE — Telephone Encounter (Signed)
Pt advised.  She states her pain was so bad she went to an urgent care and they prescribed Hydrocodone.  She is going to hold off on Prednisone for now.   Thanks,   -Mickel Baas

## 2019-03-22 NOTE — Telephone Encounter (Signed)
Left message to call for option to add glipizide, [facility] please advise as below.

## 2019-03-22 NOTE — Telephone Encounter (Signed)
We could temporarily do glipizide while she is on prednisone and stop it after prednisone.  Does carry a risk of hypoglycemia, so cautious of it in elderly patients.  If she'd like to go ahead, ok to Rx glipizide 2.5 mg daily right before breakfast x7 days.

## 2019-03-23 ENCOUNTER — Encounter: Payer: Self-pay | Admitting: Family Medicine

## 2019-03-23 LAB — SPECIMEN STATUS REPORT

## 2019-03-23 LAB — CK: Total CK: 112 U/L (ref 32–182)

## 2019-03-25 ENCOUNTER — Telehealth: Payer: Self-pay

## 2019-03-25 DIAGNOSIS — M546 Pain in thoracic spine: Secondary | ICD-10-CM | POA: Diagnosis not present

## 2019-03-25 DIAGNOSIS — M9903 Segmental and somatic dysfunction of lumbar region: Secondary | ICD-10-CM | POA: Diagnosis not present

## 2019-03-25 DIAGNOSIS — M9902 Segmental and somatic dysfunction of thoracic region: Secondary | ICD-10-CM | POA: Diagnosis not present

## 2019-03-25 DIAGNOSIS — M5136 Other intervertebral disc degeneration, lumbar region: Secondary | ICD-10-CM | POA: Diagnosis not present

## 2019-03-25 MED ORDER — GLIPIZIDE ER 2.5 MG PO TB24: 2.5000 mg | ORAL_TABLET | Freq: Every day | ORAL | 0 refills | Status: DC

## 2019-03-25 NOTE — Telephone Encounter (Signed)
-----   Message from Virginia Crews, MD sent at 03/25/2019 12:24 PM EST ----- Normal muscle enzyme lab

## 2019-03-26 DIAGNOSIS — M4310 Spondylolisthesis, site unspecified: Secondary | ICD-10-CM | POA: Insufficient documentation

## 2019-03-26 DIAGNOSIS — E119 Type 2 diabetes mellitus without complications: Secondary | ICD-10-CM | POA: Diagnosis not present

## 2019-03-26 DIAGNOSIS — M545 Low back pain, unspecified: Secondary | ICD-10-CM | POA: Insufficient documentation

## 2019-03-26 DIAGNOSIS — Z9889 Other specified postprocedural states: Secondary | ICD-10-CM | POA: Insufficient documentation

## 2019-03-26 DIAGNOSIS — M5416 Radiculopathy, lumbar region: Secondary | ICD-10-CM | POA: Insufficient documentation

## 2019-03-26 DIAGNOSIS — M419 Scoliosis, unspecified: Secondary | ICD-10-CM | POA: Insufficient documentation

## 2019-03-30 DIAGNOSIS — M545 Low back pain: Secondary | ICD-10-CM | POA: Diagnosis not present

## 2019-04-03 ENCOUNTER — Encounter: Payer: Self-pay | Admitting: Family Medicine

## 2019-04-03 DIAGNOSIS — M5416 Radiculopathy, lumbar region: Secondary | ICD-10-CM | POA: Diagnosis not present

## 2019-04-03 DIAGNOSIS — M545 Low back pain: Secondary | ICD-10-CM | POA: Diagnosis not present

## 2019-04-15 ENCOUNTER — Telehealth: Payer: Self-pay | Admitting: Family Medicine

## 2019-04-15 NOTE — Telephone Encounter (Signed)
Pt states the Jardiance is causing muscle spasms and not controlling her sugars.  In the am her blood sugars are around 168.  After prednisone and muscle relaxer, pt's sugar is still high. Pt has not taken the jardiance in 2 days, and pt is better and able to walk around.  Pt states it is time to try something else.  [facility] ([facility] [place] - [address] Phone:  [phone]  Fax:  [phone]

## 2019-04-15 NOTE — Telephone Encounter (Signed)
Prednisone is likely worsening her blood sugar and raising it further.  It is okay to stay off of the Jardiance.  I would like to discuss starting a weekly injection medication, like Trulicity.  Ideally, I would see her in the office to be able to show her how to do the first dose, but we could do this visit virtually to discuss this option and potential side effects.

## 2019-04-15 NOTE — Telephone Encounter (Signed)
Pt advised.  Apt made for 04/17/2019 at 11am   Thanks,   -Mickel Baas

## 2019-04-15 NOTE — Telephone Encounter (Signed)
From [facility] 

## 2019-04-17 ENCOUNTER — Other Ambulatory Visit: Payer: Self-pay

## 2019-04-17 ENCOUNTER — Encounter: Payer: Self-pay | Admitting: Family Medicine

## 2019-04-17 ENCOUNTER — Ambulatory Visit (INDEPENDENT_AMBULATORY_CARE_PROVIDER_SITE_OTHER): Payer: PPO | Admitting: Family Medicine

## 2019-04-17 VITALS — BP 130/60 | HR 85 | Temp 96.9°F | Resp 16 | Wt 143.0 lb

## 2019-04-17 DIAGNOSIS — E1159 Type 2 diabetes mellitus with other circulatory complications: Secondary | ICD-10-CM | POA: Diagnosis not present

## 2019-04-17 NOTE — Assessment & Plan Note (Signed)
Uncontrolled recently with last A1c 7.2 Fasting blood sugars are improving Suspect that recent infections and prednisone treatment have raised her blood sugar Unable to tolerate Jardiance-discontinue Continue metformin at current dose Up-to-date on screenings and vaccinations Long discussion regarding low-carb diet We will hold off on adding additional medication at this time and give patient 3 months to work on lifestyle interventions Discussed goal A1c of less than 7, but given her advanced age, we could consider higher goal of 7.5 At follow-up, we will recheck A1c, and if it is more elevated or remains uncontrolled, we could consider a GLP-1 therapy like Trulicity

## 2019-04-17 NOTE — Progress Notes (Signed)
Patient: Jessica Wheeler Female    DOB: Jan 11, 1941   78 y.o.   MRN: [id number] Visit Date: 04/17/2019  Today's Provider: Shirlee Latch, MD   Chief Complaint  Patient presents with  . Follow-up   Subjective:    I [place] S. Dimas, CMA, am acting as scribe for Shirlee Latch, MD.  HPI Patient here today to go over treatment for diabetes. Patient reports that she tried taking Jardiance, reports that medication was causing muscle cramps. Patient reports that since being off Jardiance her muscle pain has improved and her fasting sugars are better since stopping prednisone.  She is no longer on glipizide (was only on this with prednisone).  Fasting BG this AM was 136.  She states that her diet has not been as good this year as it has been previously.  She is willing to work on a [employer].   Allergies  Allergen Reactions  . Beta Adrenergic Blockers     Chest pressure/difficulty breathing  . Levaquin [Levofloxacin In D5w]     Low blood sugar; does take this when she needs it. She just makes sure to control her BS to avoid hypoglycemia.  . Levofloxacin   . Lisinopril Cough  . Prevnar [Pneumococcal 13-Val Conj Vacc] Rash    Joint Pain  . Sulfa Antibiotics Rash    "makes my skin raw"     Current Outpatient Medications:  .  amiloride-hydrochlorothiazide (MODURETIC) 5-50 MG tablet, TAKE 1/2 TABLET BY MOUTH DAILY AS NEEDED FOR SWELLING, Disp: 45 tablet, Rfl: 2 .  amLODipine (NORVASC) 5 MG tablet, TAKE 1 TABLET(5 MG) BY MOUTH DAILY, Disp: 90 tablet, Rfl: 2 .  aspirin 81 MG EC tablet, Take 81 mg by mouth daily.  , Disp: , Rfl:  .  Bioflavonoid Products (ESTER C PO), Take 500 mg by mouth daily. occassionally takes 2 tablets, Disp: , Rfl:  .  Cyanocobalamin (VITAMIN B-12) 1000 MCG/15ML LIQD, Take 2,500 mcg by mouth daily. , Disp: , Rfl:  .  glucose blood (FREESTYLE TEST STRIPS) test strip, Use as instructed, Disp: 100 each, Rfl: 12 .  Ibuprofen (ADVIL) 200 MG CAPS, Take 1  capsule by mouth daily.  , Disp: , Rfl:  .  latanoprost (XALATAN) 0.005 % ophthalmic solution, Reported on 10/12/2015 - both eyes nightly, Disp: , Rfl: 3 .  losartan (COZAAR) 100 MG tablet, TAKE 1 TABLET BY MOUTH EVERY DAY, Disp: 90 tablet, Rfl: 2 .  MAGNESIUM PO, Take 250 mg by mouth daily. , Disp: , Rfl:  .  metFORMIN (GLUCOPHAGE) 500 MG tablet, TAKE 2 TABLETS(1000 MG) BY MOUTH TWICE DAILY WITH MEALS, Disp: 360 tablet, Rfl: 3 .  Baclofen 5 MG TABS, Take 10 mg by mouth 3 (three) times daily as needed. (Patient not taking: Reported on 04/17/2019), Disp: 90 tablet, Rfl: 1  Review of Systems  Constitutional: Negative.   Respiratory: Negative.   Cardiovascular: Negative.   Endocrine: Negative.   Genitourinary: Negative.   Neurological: Negative.   Psychiatric/Behavioral: Negative.     Social History   Tobacco Use  . Smoking status: Former Smoker    Packs/day: 1.00    Years: 15.00    Pack years: 15.00    Types: Cigarettes    Quit date: 08/03/2003    Years since quitting: 15.7  . Smokeless tobacco: Never Used  Substance Use Topics  . Alcohol use: No      Objective:   BP 130/60 (BP Location: Left Arm, Patient Position: Sitting, Cuff Size: Normal)  Pulse 85   Temp (!) 96.9 F (36.1 C) (Temporal)   Resp 16   Wt 143 lb (64.9 kg)   BMI 25.33 kg/m  Vitals:   04/17/19 1110  BP: 130/60  Pulse: 85  Resp: 16  Temp: (!) 96.9 F (36.1 C)  TempSrc: Temporal  Weight: 143 lb (64.9 kg)  Body mass index is 25.33 kg/m.   Physical Exam Vitals reviewed.  Constitutional:      General: She is not in acute distress.    Appearance: Normal appearance. She is well-developed. She is not diaphoretic.  HENT:     Head: Normocephalic and atraumatic.  Eyes:     General: No scleral icterus.    Conjunctiva/sclera: Conjunctivae normal.  Neck:     Thyroid: No thyromegaly.  Cardiovascular:     Rate and Rhythm: Normal rate and regular rhythm.     Heart sounds: Normal heart sounds. No murmur.   Pulmonary:     Effort: Pulmonary effort is normal. No respiratory distress.     Breath sounds: Normal breath sounds. No wheezing, rhonchi or rales.  Musculoskeletal:     Cervical back: Neck supple.     Right lower leg: No edema.     Left lower leg: No edema.  Lymphadenopathy:     Cervical: No cervical adenopathy.  Skin:    General: Skin is warm and dry.     Capillary Refill: Capillary refill takes less than 2 seconds.     Findings: No rash.  Neurological:     Mental Status: She is alert and oriented to person, place, and time. Mental status is at baseline.  Psychiatric:        Mood and Affect: Mood normal.        Behavior: Behavior normal.      No results found for any visits on 04/17/19.     Assessment & Plan    Problem List Items Addressed This Visit      Endocrine   DM (diabetes mellitus) ([place]) - Primary    Uncontrolled recently with last A1c 7.2 Fasting blood sugars are improving Suspect that recent infections and prednisone treatment have raised her blood sugar Unable to tolerate Jardiance-discontinue Continue metformin at current dose Up-to-date on screenings and vaccinations Long discussion regarding low-carb diet We will hold off on adding additional medication at this time and give patient 3 months to work on lifestyle interventions Discussed goal A1c of less than 7, but given her advanced age, we could consider higher goal of 7.5 At follow-up, we will recheck A1c, and if it is more elevated or remains uncontrolled, we could consider a GLP-1 therapy like Trulicity         Approximately 25 minutes was spent in discussion of which greater than 50% was consultation.     Return in about 3 months (around 07/16/2019) for chronic disease f/u, as scheduled.   The entirety of the information documented in the History of Present Illness, Review of Systems and Physical Exam were personally obtained by me. Portions of this information were initially documented by  Lynford Humphrey, CMA and reviewed by me for thoroughness and accuracy.    Berdena Cisek, [name], MD MPH [facility]

## 2019-04-17 NOTE — Patient Instructions (Signed)

## 2019-04-29 ENCOUNTER — Ambulatory Visit
Admission: RE | Admit: 2019-04-29 | Discharge: 2019-04-29 | Disposition: A | Discharge status: Home / Self Care | Payer: PPO | Source: Ambulatory Visit | Attending: Family Medicine | Admitting: Family Medicine

## 2019-04-29 DIAGNOSIS — Z1231 Encounter for screening mammogram for malignant neoplasm of breast: Secondary | ICD-10-CM

## 2019-05-28 ENCOUNTER — Ambulatory Visit: Payer: PPO | Admitting: Podiatry

## 2019-05-28 ENCOUNTER — Encounter: Payer: Self-pay | Admitting: Podiatry

## 2019-05-28 ENCOUNTER — Other Ambulatory Visit: Payer: Self-pay

## 2019-05-28 VITALS — BP 117/52

## 2019-05-28 DIAGNOSIS — M2041 Other hammer toe(s) (acquired), right foot: Secondary | ICD-10-CM

## 2019-05-28 DIAGNOSIS — Q828 Other specified congenital malformations of skin: Secondary | ICD-10-CM | POA: Diagnosis not present

## 2019-05-28 DIAGNOSIS — M2042 Other hammer toe(s) (acquired), left foot: Secondary | ICD-10-CM | POA: Diagnosis not present

## 2019-05-28 DIAGNOSIS — E1159 Type 2 diabetes mellitus with other circulatory complications: Secondary | ICD-10-CM

## 2019-05-28 DIAGNOSIS — M79672 Pain in left foot: Secondary | ICD-10-CM

## 2019-05-29 ENCOUNTER — Encounter: Payer: Self-pay | Admitting: Podiatry

## 2019-05-29 NOTE — Progress Notes (Addendum)
Subjective:  Patient ID: Jessica Wheeler, female    DOB: 14-May-1940,  MRN: [id number]  Chief Complaint  Patient presents with  . Foot Pain    pt is here for possible porokeratosis of the left plantar forefoot, which has been going on for about 8 months, pt states that the original point had a "whitehead" on it, until she washed it off    79 y.o. female presents with the above complaint.  Patient presents with a left submet 3 painful skin lesion that has been going on for about 8 months.  Patient states it feels like she is stepping on rocks.  Patient states that she originally had a whitehead which kind of fell off when she started soaking her foot.  Patient states that is pain when elevated for excessive amount.  Patient has tried metatarsal pad but has not helped.  She states that she has not seen any other podiatrist for this.  She denies any other acute complaints.  Patient is a diabetic with controlled sugars.  Her last A1c is 7.2.   Review of Systems: Negative except as noted in the HPI. Denies N/V/F/Ch.  Past Medical History:  Diagnosis Date  . Allergy   . Arthritis   . Bundle branch block, right   . Diabetes mellitus   . Diffuse cystic mastopathy   . 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 Diverticulosis   . Family history of malignant neoplasm of gastrointestinal tract   . Glaucoma 2005  . Heart murmur   . History of bronchitis   . History of pancreatitis 2005  . Hypertension 1982  . Personal history of tobacco use, presenting hazards to health   . RBBB   . Ulcer     Current Outpatient Medications:  .  amiloride-hydrochlorothiazide (MODURETIC) 5-50 MG tablet, TAKE 1/2 TABLET BY MOUTH DAILY AS NEEDED FOR SWELLING, Disp: 45 tablet, Rfl: 2 .  amLODipine (NORVASC) 5 MG tablet, TAKE 1 TABLET(5 MG) BY MOUTH DAILY, Disp: 90 tablet, Rfl: 2 .  aspirin 81 MG EC tablet, Take 81 mg by mouth daily.  , Disp: , Rfl:  .  Baclofen 5 MG TABS, Take 10 mg by mouth 3 (three) times  daily as needed., Disp: 90 tablet, Rfl: 1 .  Bioflavonoid Products (ESTER C PO), Take 500 mg by mouth daily. occassionally takes 2 tablets, Disp: , Rfl:  .  Cyanocobalamin (VITAMIN B-12) 1000 MCG/15ML LIQD, Take 2,500 mcg by mouth daily. , Disp: , Rfl:  .  glucose blood (FREESTYLE TEST STRIPS) test strip, Use as instructed, Disp: 100 each, Rfl: 12 .  Ibuprofen (ADVIL) 200 MG CAPS, Take 1 capsule by mouth daily.  , Disp: , Rfl:  .  latanoprost (XALATAN) 0.005 % ophthalmic solution, Reported on 10/12/2015 - both eyes nightly, Disp: , Rfl: 3 .  losartan (COZAAR) 100 MG tablet, TAKE 1 TABLET BY MOUTH EVERY DAY, Disp: 90 tablet, Rfl: 2 .  MAGNESIUM PO, Take 250 mg by mouth daily. , Disp: , Rfl:  .  metFORMIN (GLUCOPHAGE) 500 MG tablet, TAKE 2 TABLETS(1000 MG) BY MOUTH TWICE DAILY WITH MEALS, Disp: 360 tablet, Rfl: 3 .  ezetimibe (ZETIA) 10 MG tablet, Take 1 tablet (10 mg total) by mouth daily., Disp: 30 tablet, Rfl: 11  Social History   Tobacco Use  Smoking Status Former Smoker  . Packs/day: 1.00  . Years: 15.00  . Pack years: 15.00  . Types: Cigarettes  . Quit date: 08/03/2003  . Years since  quitting: 15.8  Smokeless Tobacco Never Used    Allergies  Allergen Reactions  . Beta Adrenergic Blockers     Chest pressure/difficulty breathing  . Jardiance [Empagliflozin]     Muscle cramps  . Levaquin [Levofloxacin In D5w]     Low blood sugar; does take this when she needs it. She just makes sure to control her BS to avoid hypoglycemia.  . Levofloxacin   . Lisinopril Cough  . Prevnar [Pneumococcal 13-Val Conj Vacc] Rash    Joint Pain  . Sulfa Antibiotics Rash    "makes my skin raw"   Objective:   Vitals:   05/28/19 1358  BP: (!) 117/52   There is no height or weight on file to calculate BMI. Constitutional Well developed. Well nourished.  Vascular Dorsalis pedis pulses palpable bilaterally. Posterior tibial pulses palpable bilaterally. Capillary refill normal to all digits.  No  cyanosis or clubbing noted. Pedal hair growth normal.  Neurologic Normal speech. Oriented to person, place, and time. Epicritic sensation to light touch grossly present bilaterally.  Dermatologic  hyperkeratotic lesion noted left submet 3 with a nucleated central core.  Pain on palpation to the lesion.  Orthopedic: Normal joint ROM without pain or crepitus bilaterally. Hammertoe contractures noted of digits 1 through 5 bilaterally. No bony tenderness.   Radiographs: None Assessment:   1. Porokeratosis   2. Left foot pain   3. Type 2 diabetes mellitus with other circulatory complication, without long-term current use of insulin (HCC)   4. Hammertoes of both feet    Plan:  Patient was evaluated and treated and all questions answered.  Left submet 3 benign skin lesion/porokeratosis -I explained to the patient the etiology of porokeratosis and various treatment options associated with it.  Using a chisel blade and a handle I aggressively debrided down the porokeratosis down to healthy striated tissue.  I also excised out the central nucleated core which gave her relief once removed.  No pinpoint bleeding noted.  No other complication noted.  Hammertoe contractures noted bilaterally -I believe patient will benefit from a diabetic insoles/shoes to help distribute/offload the pressures from the contractures of the toes.  Return in about 3 months (around 08/26/2019).

## 2019-05-31 NOTE — Progress Notes (Signed)
Cardiology Office Note  Date:  06/03/2019   ID:  Jessica Wheeler, DOB 1941/03/04, MRN [id number]  PCP:  Jessica Downer, MD   Chief Complaint  Patient presents with  . other    12 month f/u c/o having episode around christmas day pt mentioned she had felt really sick and vomiting and felt really weak after wants to make sure she didn't have MI. Meds reviewed verball with pt.    HPI:  Jessica Wheeler is a very pleasant 79 year old woman with a history of  diabetes, HBa1C 6.7 hypertension,  smoking,  aortic dissection following catheterization,   renal artery stenosis followed by Jessica Wheeler.  Chronically low sodium.  Cardiac catheterization in 2007  showed 20% proximal RCA disease and 20% LAD disease.She has a short region of dissection in the distal aorta, seen on CT scan in 2010 On U/S in 2013: no dissection aortoiliac atherosclerosis, followed by Jessica Wheeler who presents for routine followup of her coronary artery disease.   04/26/2019 Woke with nausea, vomiting, leg weakness Better after one hour Fine since then Active with no anginal symptoms  Tried jardiance, had side effects, leg weakness Better off the medication Difficult time tolerating many medications Gets chronic muscle pain  Labs reviewed Total chol 186 LDL 81 HBA1C 7.2  Thinks that she needs a cholesterol medication Never tried a statin or Zetia  EKG personally reviewed by myself on todays visit Shows normal sinus rhythm with rate 85 bpm no significant ST or T wave changes, rbbb  Other past medical history reviewed lower extremity arterial studies from Jessica Wheeler  Noninvasive studies show mildly elevated velocities in the common iliac arteries bilaterally although this does not appear to be significantly high-grade.  Her aorta is not aneurysmal.  Previously with Intolerance to imdur, ziac  feels terrible on beta blockers  chronic Bladder problem Previously with left knee pain, chronic issue, now better  Xray of  her knee, osteoarthritis, Baker's cyst, proved on NSAIDs  severe reaction to pneumonia shot last year in 2015, still with hand weakness, joint problems. She had a rash initially that has resolved  Prior history of shingles  Previous  Echocardiogram was performed that was normal  Stress test/Myoview was also normal showing no ischemia .    PMH:   has a past medical history of Allergy, Arthritis, Bundle branch block, right, Diabetes mellitus, Diffuse cystic mastopathy, Dissection, aorta (HCC), Diverticulosis, Family history of malignant neoplasm of gastrointestinal tract, Glaucoma (2005), Heart murmur, History of bronchitis, History of pancreatitis (2005), Hypertension (1982), Personal history of tobacco use, presenting hazards to health, RBBB, and Ulcer.  PSH:    Past Surgical History:  Procedure Laterality Date  . ABDOMINAL HYSTERECTOMY  1991  . APPENDECTOMY    . BACK SURGERY  1982   for lumbar ruptured disc. No hardware, no discectomy, no fusion  . BLADDER SURGERY    . BREAST BIOPSY Right 1980's   benign  . BREAST EXCISIONAL BIOPSY Right 1980s   benign  . BREAST SURGERY     biopsy  . CARDIAC CATHETERIZATION     Jessica Wheeler, few years ago  . CATARACT EXTRACTION W/PHACO Left 08/31/2015   Procedure: CATARACT EXTRACTION PHACO AND INTRAOCULAR LENS PLACEMENT (IOC);  Surgeon: Jessica Hess, MD;  Location: [facility];  Service: Ophthalmology;  Laterality: Left;  DIABETIC - oral meds  . CATARACT EXTRACTION W/PHACO Right 10/12/2015   Procedure: CATARACT EXTRACTION PHACO AND INTRAOCULAR LENS PLACEMENT (IOC) right eye;  Surgeon: Jessica Hammock  Vin-Parikh, MD;  Location: [facility];  Service: Ophthalmology;  Laterality: Right;  DIABETIC - oral meds  . CHOLECYSTECTOMY  1994  . COLONOSCOPY  2008,02/22/2012   Jessica Wheeler-2013  . COLONOSCOPY WITH PROPOFOL N/A 04/04/2017   Procedure: COLONOSCOPY WITH PROPOFOL;  Surgeon: Jessica Brightly, MD;  Location: [facility];   Service: Endoscopy;  Laterality: N/A;  . REFRACTIVE SURGERY      Current Outpatient Medications  Medication Sig Dispense Refill  . amiloride-hydrochlorothiazide (MODURETIC) 5-50 MG tablet TAKE 1/2 TABLET BY MOUTH DAILY AS NEEDED FOR SWELLING 45 tablet 2  . amLODipine (NORVASC) 5 MG tablet TAKE 1 TABLET(5 MG) BY MOUTH DAILY 90 tablet 2  . aspirin 81 MG EC tablet Take 81 mg by mouth daily.      . Baclofen 5 MG TABS Take 10 mg by mouth 3 (three) times daily as needed. 90 tablet 1  . Bioflavonoid Products (ESTER C PO) Take 500 mg by mouth daily. occassionally takes 2 tablets    . Cyanocobalamin (VITAMIN B-12) 1000 MCG/15ML LIQD Take 2,500 mcg by mouth daily.     Marland Kitchen glucose blood (FREESTYLE TEST STRIPS) test strip Use as instructed 100 each 12  . Ibuprofen (ADVIL) 200 MG CAPS Take 1 capsule by mouth daily.      Marland Kitchen latanoprost (XALATAN) 0.005 % ophthalmic solution Reported on 10/12/2015 - both eyes nightly  3  . losartan (COZAAR) 100 MG tablet TAKE 1 TABLET BY MOUTH EVERY DAY 90 tablet 2  . MAGNESIUM PO Take 250 mg by mouth daily.     . metFORMIN (GLUCOPHAGE) 500 MG tablet TAKE 2 TABLETS(1000 MG) BY MOUTH TWICE DAILY WITH MEALS 360 tablet 3   No current facility-administered medications for this visit.     Allergies:   Beta adrenergic blockers, Jardiance [empagliflozin], Levaquin [levofloxacin in d5w], Levofloxacin, Lisinopril, Prevnar [pneumococcal 13-val conj vacc], and Sulfa antibiotics   Social History:  The patient  reports that she quit smoking about 15 years ago. Her smoking use included cigarettes. She has a 15.00 pack-year smoking history. She has never used smokeless tobacco. She reports that she does not drink alcohol or use drugs.   Family History:   family history includes Anxiety disorder in her mother; Breast cancer (age of onset: 63) in her cousin; Breast cancer (age of onset: 14) in her maternal aunt; Colon cancer in her brother; Diabetes in her mother; Heart attack in her father;  Heart disease in her father; Hypertension in her maternal grandfather, maternal grandmother, and mother; Rheum arthritis in her father.    Review of Systems: Review of Systems  Constitutional: Negative.   HENT: Negative.   Respiratory: Negative.   Cardiovascular: Negative.   Gastrointestinal: Negative.   Musculoskeletal: Positive for joint pain.  Neurological: Negative.   Psychiatric/Behavioral: Negative.   All other systems reviewed and are negative.   PHYSICAL EXAM: VS:  BP 140/60 (BP Location: Left Arm, Patient Position: Sitting, Cuff Size: Normal)   Pulse 85   Ht 5' 3.5" (1.613 m)   Wt 144 lb 4 oz (65.4 kg)   SpO2 98%   BMI 25.15 kg/m  , BMI Body mass index is 25.15 kg/m. Constitutional:  oriented to person, place, and time. No distress.  HENT:  Head: Grossly normal Eyes:  no discharge. No scleral icterus.  Neck: No JVD, no carotid bruits  Cardiovascular: Regular rate and rhythm, no murmurs appreciated Pulmonary/Chest: Clear to auscultation bilaterally, no wheezes or rails Abdominal: Soft.  no distension.  no tenderness.  Musculoskeletal: Normal range of motion Neurological:  normal muscle tone. Coordination normal. No atrophy Skin: Skin warm and dry Psychiatric: normal affect, pleasant  Recent Labs: 03/15/2019: ALT 33 03/20/2019: BUN 18; Creatinine, Ser 0.80; Magnesium 2.1; Potassium 4.0; Sodium 133    Lipid Panel Lab Results  Component Value Date   CHOL 186 03/15/2019   HDL 89 03/15/2019   LDLCALC 81 03/15/2019   TRIG 88 03/15/2019    Wt Readings from Last 3 Encounters:  06/03/19 144 lb 4 oz (65.4 kg)  04/17/19 143 lb (64.9 kg)  03/20/19 142 lb (64.4 kg)     ASSESSMENT AND PLAN:  Coronary artery disease involving native coronary artery of native heart without angina pectoris -  Currently with no symptoms of angina. No further workup at this time. Recommend she add Zetia  Essential hypertension -  Blood pressure high end of normal range, previous  medication intolerances, no changes made at this time Recommended walking program  Pure hypercholesterolemia Currently not on a statin, never tried 1 in the past but gets chronic muscle pain Recommend she add Zetia 10 mg daily  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HCC) Not seen on ultrasound No further work-up ordered  Type 2 diabetes mellitus with other specified complication, without long-term current use of insulin (HCC) Recommended walking program, strict diet  Primary osteoarthritis of left knee Osteoarthritis, on meloxicam Prior Baker cyst  Disposition:   F/U  12 months   Total encounter time more than 25 minutes  Greater than 50% was spent in counseling and coordination of care with the patient    Orders Placed This Encounter  Procedures  . EKG 12-Lead     Signed, Esmond Plants, M.D., Ph.D. 06/03/2019  [place], [place]

## 2019-06-03 ENCOUNTER — Encounter: Payer: Self-pay | Admitting: Cardiovascular Disease

## 2019-06-03 ENCOUNTER — Other Ambulatory Visit: Payer: Self-pay

## 2019-06-03 ENCOUNTER — Ambulatory Visit: Payer: PPO | Admitting: Cardiovascular Disease

## 2019-06-03 VITALS — BP 140/60 | HR 85 | Ht 63.5 in | Wt 144.2 lb

## 2019-06-03 DIAGNOSIS — I701 Atherosclerosis of renal artery: Secondary | ICD-10-CM | POA: Diagnosis not present

## 2019-06-03 DIAGNOSIS — E1159 Type 2 diabetes mellitus with other circulatory complications: Secondary | ICD-10-CM | POA: Diagnosis not present

## 2019-06-03 DIAGNOSIS — E871 Hypo-osmolality and hyponatremia: Secondary | ICD-10-CM

## 2019-06-03 DIAGNOSIS — E782 Mixed hyperlipidemia: Secondary | ICD-10-CM

## 2019-06-03 DIAGNOSIS — I7102 Dissection of abdominal aorta: Secondary | ICD-10-CM

## 2019-06-03 DIAGNOSIS — I10 Essential (primary) hypertension: Secondary | ICD-10-CM

## 2019-06-03 DIAGNOSIS — I25118 Atherosclerotic heart disease of native coronary artery with other forms of angina pectoris: Secondary | ICD-10-CM | POA: Diagnosis not present

## 2019-06-03 MED ORDER — EZETIMIBE 10 MG PO TABS: 10.0000 mg | ORAL_TABLET | Freq: Every day | ORAL | 11 refills | Status: DC

## 2019-06-03 NOTE — Patient Instructions (Addendum)
Medication Instructions:  Please start zetia 10 mg daily   If you need a refill on your cardiac medications before your next appointment, please call your pharmacy.    Lab work: No new labs needed   If you have labs (blood work) drawn today and your tests are completely normal, you will receive your results only by: . MyChart Message (if you have MyChart) OR . A paper copy in the mail If you have any lab test that is abnormal or we need to change your treatment, we will call you to review the results.   Testing/Procedures: No new testing needed   Follow-Up: At [facility], you and your health needs are our priority.  As part of our continuing mission to provide you with exceptional heart care, we have created designated Provider Care Teams.  These Care Teams include your primary Cardiologist (physician) and Advanced Practice Providers (APPs -  Physician Assistants and Nurse Practitioners) who all work together to provide you with the care you need, when you need it.  . You will need a follow up appointment in 12 months   . Providers on your designated Care Team:   . Christopher Berge, NP . Ryan Dunn, PA-C . Jacquelyn Visser, PA-C  Any Other Special Instructions Will Be Listed Below (If Applicable).  For educational health videos Log in to : [email or web address] Or : [email or web address], password : [email or web address]   

## 2019-06-12 ENCOUNTER — Telehealth: Payer: Self-pay | Admitting: Podiatry

## 2019-07-02 NOTE — Progress Notes (Signed)
Subjective:   Jessica Wheeler is a 79 y.o. female who presents for Medicare Annual (Subsequent) preventive examination.    This visit is being conducted through telemedicine due to the COVID-19 pandemic. This patient has given me verbal consent via doximity to conduct this visit, patient states they are participating from their home address. Some vital signs may be absent or patient reported.    Patient identification: identified by name, DOB, and current address  Review of Systems:  N/A  Cardiac Risk Factors include: advanced age (>45men, >17 women);diabetes mellitus;dyslipidemia;hypertension     Objective:     Vitals: There were no vitals taken for this visit.  There is no height or weight on file to calculate BMI. Unable to obtain vitals due to visit being conducted via telephonically.   Advanced Directives 07/03/2019 07/02/2018 06/29/2017 04/04/2017 10/12/2015 08/31/2015 03/04/2015  Does Patient Have a Medical Advance Directive? Yes Yes Yes Yes No Yes Yes  Type of Paramedic of [place];Living will [place];Living will [place];Living will - - [place];Living will Living will  Copy of [place] in Chart? No - copy requested No - copy requested No - copy requested - - No - copy requested -  Pre-existing out of facility DNR order (yellow form or pink MOST form) - - - Physician notified to receive inpatient order;Pink MOST form placed in chart (order not valid for inpatient use) - - -    Tobacco Social History   Tobacco Use  Smoking Status Former Smoker   Packs/day: 1.00   Years: 15.00   Pack years: 15.00   Types: Cigarettes   Quit date: 08/03/2003   Years since quitting: 15.9  Smokeless Tobacco Never Used     Counseling given: Not Answered   Clinical Intake:  Pre-visit preparation completed: Yes  Pain : No/denies pain Pain Score: 0-No pain     Nutritional Risks:  None Diabetes: Yes  How often do you need to have someone help you when you read instructions, pamphlets, or other written materials from your doctor or pharmacy?: 1 - Never   Diabetes:  Is the patient diabetic?  Yes  If diabetic, was a CBG obtained today?  No  Did the patient bring in their glucometer from home?  No  How often do you monitor your CBG's? Once to twice daily..   Financial Strains and Diabetes Management:  Are you having any financial strains with the device, your supplies or your medication? No .  Does the patient want to be seen by Chronic Care Management for management of their diabetes?  No  Would the patient like to be referred to a Nutritionist or for Diabetic Management?  No   Diabetic Exams:  Diabetic Eye Exam: Completed 11/19/18. Repeat yearly.  Diabetic Foot Exam: Completed 07/03/18. Pt has been advised about the importance in completing this exam. Note made to follow up on this at [place] in office visit.     Interpreter Needed?: No  Information entered by :: Mmarkoski, LPN  Past Medical History:  Diagnosis Date   Allergy    Arthritis    Bundle branch block, right    Diabetes mellitus    Diffuse cystic mastopathy    Dissection, aorta (HCC)    Dr.Dew follows   Diverticulosis    Family history of malignant neoplasm of gastrointestinal tract    Glaucoma 2005   Heart murmur    History of bronchitis  History of pancreatitis 2005   Hypertension 1982   Personal history of tobacco use, presenting hazards to health    RBBB    Ulcer    Past Surgical History:  Procedure Laterality Date   ABDOMINAL HYSTERECTOMY  1991   APPENDECTOMY     BACK SURGERY  1982   for lumbar ruptured disc. No hardware, no discectomy, no fusion   BLADDER SURGERY     BREAST BIOPSY Right 1980's   benign   BREAST EXCISIONAL BIOPSY Right 1980s   benign   BREAST SURGERY     biopsy   CARDIAC CATHETERIZATION     Dr. Lady Gary, few years ago    CATARACT EXTRACTION W/PHACO Left 08/31/2015   Procedure: CATARACT EXTRACTION PHACO AND INTRAOCULAR LENS PLACEMENT (IOC);  Surgeon: Sherald Hess, MD;  [facility];  Service: Ophthalmology;  Laterality: Left;  DIABETIC - oral meds   CATARACT EXTRACTION W/PHACO Right 10/12/2015   Procedure: CATARACT EXTRACTION PHACO AND INTRAOCULAR LENS PLACEMENT (IOC) right eye;  Surgeon: Sherald Hess, MD;  Location: [facility];  Service: Ophthalmology;  Laterality: Right;  DIABETIC - oral meds   CHOLECYSTECTOMY  1994   COLONOSCOPY  2008,02/22/2012   Dr. Sankar-2013   COLONOSCOPY WITH PROPOFOL N/A 04/04/2017   Procedure: COLONOSCOPY WITH PROPOFOL;  Surgeon: [name], MD;  Location: [facility];  Service: Endoscopy;  Laterality: N/A;   REFRACTIVE SURGERY     Family History  Problem Relation Age of Onset   Diabetes Mother    Hypertension Mother    Anxiety disorder Mother    Heart disease Father        rheumatic heart disease   Heart attack Father    Rheum arthritis Father    Colon cancer Brother    Hypertension Maternal Grandmother    Hypertension Maternal Grandfather    Breast cancer Maternal Aunt 78   Breast cancer Cousin 28   Social History   Socioeconomic History   Marital status: Widowed    Spouse name: Not on file   Number of children: 2   Years of education: 14   Highest education level: Associate degree: academic program  Occupational History   Occupation: Retired  Tobacco Use   Smoking status: Former Smoker    Packs/day: 1.00    Years: 15.00    Pack years: 15.00    Types: Cigarettes    Quit date: 08/03/2003    Years since quitting: 15.9   Smokeless tobacco: Never Used  Substance and Sexual Activity   Alcohol use: No   Drug use: No   Sexual activity: Not on file  Other Topics Concern   Not on file  Social History Narrative   Not on file   Social Determinants of Health   Financial  Resource Strain: Low Risk    Difficulty of Paying Living Expenses: Not hard at all  Food Insecurity: No Food Insecurity   Worried About Programme researcher, broadcasting/film/video in the Last Year: Never true   Ran Out of Food in the Last Year: Never true  Transportation Needs: No Transportation Needs   Lack of Transportation (Medical): No   Lack of Transportation (Non-Medical): No  Physical Activity: Sufficiently Active   Days of Exercise per Week: 5 days   Minutes of Exercise per Session: 40 min  Stress: No Stress Concern Present   Feeling of Stress : Not at all  Social Connections: Moderately Isolated   Frequency of Communication with Friends and Family: More than three  times a week   Frequency of Social Gatherings with Friends and Family: Once a week   Attends Religious Services: Never   Database administrator or Organizations: No   Attends Banker Meetings: Never   Marital Status: Widowed    Outpatient Encounter Medications as of 07/03/2019  Medication Sig   amiloride-hydrochlorothiazide (MODURETIC) 5-50 MG tablet TAKE 1/2 TABLET BY MOUTH DAILY AS NEEDED FOR SWELLING   amLODipine (NORVASC) 5 MG tablet TAKE 1 TABLET(5 MG) BY MOUTH DAILY   aspirin 81 MG EC tablet Take 81 mg by mouth daily.     Bioflavonoid Products (ESTER C PO) Take 500 mg by mouth daily. occassionally takes 2 tablets   Cyanocobalamin (VITAMIN B-12) 1000 MCG/15ML LIQD Take 1,000 mcg by mouth daily.    glucose blood (FREESTYLE TEST STRIPS) test strip Use as instructed   Ibuprofen (ADVIL) 200 MG CAPS Take 1 capsule by mouth daily.     latanoprost (XALATAN) 0.005 % ophthalmic solution Reported on 10/12/2015 - both eyes nightly   losartan (COZAAR) 100 MG tablet TAKE 1 TABLET BY MOUTH EVERY DAY   MAGNESIUM PO Take 250 mg by mouth daily.    metFORMIN (GLUCOPHAGE) 500 MG tablet TAKE 2 TABLETS(1000 MG) BY MOUTH TWICE DAILY WITH MEALS   phenazopyridine (URISTAT) 95 MG tablet Take 95 mg by mouth 3 (three)  times daily as needed for pain.   Baclofen 5 MG TABS Take 10 mg by mouth 3 (three) times daily as needed. (Patient not taking: Reported on 07/03/2019)   ezetimibe (ZETIA) 10 MG tablet Take 1 tablet (10 mg total) by mouth daily. (Patient not taking: Reported on 07/03/2019)   No facility-administered encounter medications on file as of 07/03/2019.    Activities of Daily Living In your present state of health, do you have any difficulty performing the following activities: 07/03/2019 07/03/2018  Hearing? N Y  Vision? N N  Difficulty concentrating or making decisions? N N  Walking or climbing stairs? N N  Dressing or bathing? N N  Doing errands, shopping? N N  Preparing Food and eating ? N -  Using the Toilet? N -  In the past six months, have you accidently leaked urine? N -  Do you have problems with loss of bowel control? N -  Managing your Medications? N -  Managing your Finances? N -  Housekeeping or managing your Housekeeping? N -  Some recent data might be hidden    Patient Care Team: Bacigalupo, Marzella Schlein, MD as PCP - General (Family Medicine) Mariah Milling, Tollie Pizza, MD as Consulting Physician (Cardiology) Candelaria Stagers, DPM as Consulting Physician (Podiatry) Domingo Madeira, OD (Optometry)    Assessment:   This is a routine wellness examination for [place].  Exercise Activities and Dietary recommendations Current Exercise Habits: Home exercise routine, Type of exercise: walking, Time (Minutes): 45(to 1 hour), Frequency (Times/Week): 5, Weekly Exercise (Minutes/Week): 225, Intensity: Mild, Exercise limited by: None identified  Goals     DIET - INCREASE WATER INTAKE     Recommend increasing water intake to 6-8 glasses a day.        Fall Risk: Fall Risk  07/03/2019 07/03/2018 07/03/2018 07/02/2018 06/18/2018  Falls in the past year? 0 1 1 1  0  Number falls in past yr: 0 0 0 0 0  Injury with Fall? 0 1 1 0 0  Comment - - - just bruised -  Follow up - - - Falls prevention discussed -  FALL RISK PREVENTION PERTAINING TO THE HOME:  Any stairs in or around the home? Yes  If so, are there any without handrails? No   Home free of loose throw rugs in walkways, pet beds, electrical cords, etc? Yes  Adequate lighting in your home to reduce risk of falls? Yes   ASSISTIVE DEVICES UTILIZED TO PREVENT FALLS:  Life alert? No  Use of a cane, walker or w/c? No  Grab bars in the bathroom? No  Shower chair or bench in shower? Yes  Elevated toilet seat or a handicapped toilet? No   TIMED UP AND GO:  Was the test performed? No .    Depression Screen PHQ 2/9 Scores 07/03/2019 07/03/2018 07/03/2018 07/02/2018  PHQ - 2 Score 0 0 0 0  PHQ- 9 Score - 0 - -     Cognitive Function: Declined today.        Immunization History  Administered Date(s) Administered   Fluad Quad(high Dose 65+) 02/26/2019   Influenza, High Dose Seasonal PF 02/03/2017, 01/20/2018   Influenza-Unspecified 01/28/2016, 01/30/2017   Pneumococcal Conjugate-13 09/10/2013, 02/26/2014   Pneumococcal Polysaccharide-23 08/23/2010   Zoster 06/14/2011    Qualifies for Shingles Vaccine? Yes  Zostavax completed 06/14/11. Due for Shingrix. Pt has been advised to call insurance company to determine out of pocket expense. Advised may also receive vaccine at local pharmacy or Health Dept. Verbalized acceptance and understanding.  Tdap: Although this vaccine is not a covered service during a Wellness Exam, does the patient still wish to receive this vaccine today?  No . Advised may receive this vaccine at local pharmacy or Health Dept. Aware to provide a copy of the vaccination record if obtained from local pharmacy or Health Dept. Verbalized acceptance and understanding.  Flu Vaccine: Up to date  Pneumococcal Vaccine: Completed series   Screening Tests Health Maintenance  Topic Date Due   FOOT EXAM  07/03/2019   TETANUS/TDAP  01/23/2023 (Originally 02/08/1960)   HEMOGLOBIN A1C  09/12/2019    OPHTHALMOLOGY EXAM  11/19/2019   INFLUENZA VACCINE  Completed   DEXA SCAN  Completed   PNA vac Low Risk Adult  Completed    Cancer Screenings:  Colorectal Screening: No longer required.   Mammogram: No longer required.   Bone Density: Completed 06/06/16. Previous DEXA scan was normal. No repeat needed unless advised by a physician.  Lung Cancer Screening: (Low Dose CT Chest recommended if Age 68-80 years, 30 pack-year currently smoking OR have quit w/in 15years.) does not qualify.   Additional Screening:  Dental Screening: Recommended annual dental exams for proper oral hygiene   Community Resource Referral:  CRR required this visit?  No       Plan:  I have personally reviewed and addressed the Medicare Annual Wellness questionnaire and have noted the following in the patients chart:  A. Medical and social history B. Use of alcohol, tobacco or illicit drugs  C. Current medications and supplements D. Functional ability and status E.  Nutritional status F.  Physical activity G. Advance directives H. List of other physicians I.  Hospitalizations, surgeries, and ER visits in previous 12 months J.  Vitals K. Screenings such as hearing and vision if needed, cognitive and depression L. Referrals and appointments   In addition, I have reviewed and discussed with patient certain preventive protocols, quality metrics, and best practice recommendations. A written personalized care plan for preventive services as well as general preventive health recommendations were provided to patient.   Signed,  Dolan Xia [place], [place]  11/30/2749 Nurse Health Advisor   Nurse Notes: Pt needs a diabetic foot exam at tomorrows in office apt. Also, pt thinks she has a UTI. Her s/s consists of burning with urination, frequency, urine odor and cloudiness. Pt is currently taking Uristat for symptoms but is not getting better. Pt declined sooner apt. Advised pt to call 911 if any severe symptoms  occur. FYI!

## 2019-07-03 ENCOUNTER — Other Ambulatory Visit: Payer: Self-pay

## 2019-07-03 ENCOUNTER — Ambulatory Visit (INDEPENDENT_AMBULATORY_CARE_PROVIDER_SITE_OTHER): Payer: PPO

## 2019-07-03 ENCOUNTER — Ambulatory Visit: Payer: Self-pay

## 2019-07-03 DIAGNOSIS — Z0000 Encounter for general adult medical examination without abnormal findings: Secondary | ICD-10-CM | POA: Diagnosis not present

## 2019-07-03 NOTE — Patient Instructions (Signed)
Jessica Wheeler , Thank you for taking time to come for your Medicare Wellness Visit. I appreciate your ongoing commitment to your health goals. Please review the following plan we discussed and let me know if I can assist you in the future.   Screening recommendations/referrals: Colonoscopy: No longer required.  Mammogram: No longer required.  Bone Density: Up to date. Previous DEXA scan was normal. No repeat needed unless advised by a physician. Recommended yearly ophthalmology/optometry visit for glaucoma screening and checkup Recommended yearly dental visit for hygiene and checkup  Vaccinations: Influenza vaccine: Up to date Pneumococcal vaccine: Completed series Tdap vaccine: Pt declines today.  Shingles vaccine: Pt declines today.     Advanced directives: Please bring a copy of your POA (Power of Attorney) and/or Living Will to your next appointment.   Conditions/risks identified: Recommend increasing water intake to 6-8 8 oz glasses a day.  Next appointment: 07/04/19 @ 10:00 AM with Dr Beryle Flock    Preventive Care 65 Years and Older, Female Preventive care refers to lifestyle choices and visits with your health care provider that can promote health and wellness. What does preventive care include?  A yearly physical exam. This is also called an annual well check.  Dental exams once or twice a year.  Routine eye exams. Ask your health care provider how often you should have your eyes checked.  Personal lifestyle choices, including:  Daily care of your teeth and gums.  Regular physical activity.  Eating a healthy diet.  Avoiding tobacco and drug use.  Limiting alcohol use.  Practicing safe sex.  Taking low-dose aspirin every day.  Taking vitamin and mineral supplements as recommended by your health care provider. What happens during an annual well check? The services and screenings done by your health care provider during your annual well check will depend on your age,  overall health, lifestyle risk factors, and family history of disease. Counseling  Your health care provider may ask you questions about your:  Alcohol use.  Tobacco use.  Drug use.  Emotional well-being.  Home and relationship well-being.  Sexual activity.  Eating habits.  History of falls.  Memory and ability to understand (cognition).  Work and work Astronomer.  Reproductive health. Screening  You may have the following tests or measurements:  Height, weight, and BMI.  Blood pressure.  Lipid and cholesterol levels. These may be checked every 5 years, or more frequently if you are over 72 years old.  Skin check.  Lung cancer screening. You may have this screening every year starting at age 32 if you have a 30-pack-year history of smoking and currently smoke or have quit within the past 15 years.  Fecal occult blood test (FOBT) of the stool. You may have this test every year starting at age 84.  Flexible sigmoidoscopy or colonoscopy. You may have a sigmoidoscopy every 5 years or a colonoscopy every 10 years starting at age 69.  Hepatitis C blood test.  Hepatitis B blood test.  Sexually transmitted disease (STD) testing.  Diabetes screening. This is done by checking your blood sugar (glucose) after you have not eaten for a while (fasting). You may have this done every 1-3 years.  Bone density scan. This is done to screen for osteoporosis. You may have this done starting at age 35.  Mammogram. This may be done every 1-2 years. Talk to your health care provider about how often you should have regular mammograms. Talk with your health care provider about your test results, treatment  options, and if necessary, the need for more tests. Vaccines  Your health care provider may recommend certain vaccines, such as:  Influenza vaccine. This is recommended every year.  Tetanus, diphtheria, and acellular pertussis (Tdap, Td) vaccine. You may need a Td booster every 10  years.  Zoster vaccine. You may need this after age 61.  Pneumococcal 13-valent conjugate (PCV13) vaccine. One dose is recommended after age 54.  Pneumococcal polysaccharide (PPSV23) vaccine. One dose is recommended after age 1. Talk to your health care provider about which screenings and vaccines you need and how often you need them. This information is not intended to replace advice given to you by your health care provider. Make sure you discuss any questions you have with your health care provider. Document Released: 05/15/2015 Document Revised: 01/06/2016 Document Reviewed: 02/17/2015 Elsevier Interactive Patient Education  2017 [place] Prevention in the Home Falls can cause injuries. They can happen to people of all ages. There are many things you can do to make your home safe and to help prevent falls. What can I do on the outside of my home?  Regularly fix the edges of walkways and driveways and fix any cracks.  Remove anything that might make you trip as you walk through a door, such as a raised step or threshold.  Trim any bushes or trees on the path to your home.  Use bright outdoor lighting.  Clear any walking paths of anything that might make someone trip, such as rocks or tools.  Regularly check to see if handrails are loose or broken. Make sure that both sides of any steps have handrails.  Any raised decks and porches should have guardrails on the edges.  Have any leaves, snow, or ice cleared regularly.  Use sand or salt on walking paths during winter.  Clean up any spills in your garage right away. This includes oil or grease spills. What can I do in the bathroom?  Use night lights.  Install grab bars by the toilet and in the tub and shower. Do not use towel bars as grab bars.  Use non-skid mats or decals in the tub or shower.  If you need to sit down in the shower, use a plastic, non-slip stool.  Keep the floor dry. Clean up any water that  spills on the floor as soon as it happens.  Remove soap buildup in the tub or shower regularly.  Attach bath mats securely with double-sided non-slip rug tape.  Do not have throw rugs and other things on the floor that can make you trip. What can I do in the bedroom?  Use night lights.  Make sure that you have a light by your bed that is easy to reach.  Do not use any sheets or blankets that are too big for your bed. They should not hang down onto the floor.  Have a firm chair that has side arms. You can use this for support while you get dressed.  Do not have throw rugs and other things on the floor that can make you trip. What can I do in the kitchen?  Clean up any spills right away.  Avoid walking on wet floors.  Keep items that you use a lot in easy-to-reach places.  If you need to reach something above you, use a strong step stool that has a grab bar.  Keep electrical cords out of the way.  Do not use floor polish or wax that makes floors slippery.  If you must use wax, use non-skid floor wax.  Do not have throw rugs and other things on the floor that can make you trip. What can I do with my stairs?  Do not leave any items on the stairs.  Make sure that there are handrails on both sides of the stairs and use them. Fix handrails that are broken or loose. Make sure that handrails are as long as the stairways.  Check any carpeting to make sure that it is firmly attached to the stairs. Fix any carpet that is loose or worn.  Avoid having throw rugs at the top or bottom of the stairs. If you do have throw rugs, attach them to the floor with carpet tape.  Make sure that you have a light switch at the top of the stairs and the bottom of the stairs. If you do not have them, ask someone to add them for you. What else can I do to help prevent falls?  Wear shoes that:  Do not have high heels.  Have rubber bottoms.  Are comfortable and fit you well.  Are closed at the  toe. Do not wear sandals.  If you use a stepladder:  Make sure that it is fully opened. Do not climb a closed stepladder.  Make sure that both sides of the stepladder are locked into place.  Ask someone to hold it for you, if possible.  Clearly mark and make sure that you can see:  Any grab bars or handrails.  First and last steps.  Where the edge of each step is.  Use tools that help you move around (mobility aids) if they are needed. These include:  Canes.  Walkers.  Scooters.  Crutches.  Turn on the lights when you go into a dark area. Replace any light bulbs as soon as they burn out.  Set up your furniture so you have a clear path. Avoid moving your furniture around.  If any of your floors are uneven, fix them.  If there are any pets around you, be aware of where they are.  Review your medicines with your doctor. Some medicines can make you feel dizzy. This can increase your chance of falling. Ask your doctor what other things that you can do to help prevent falls. This information is not intended to replace advice given to you by your health care provider. Make sure you discuss any questions you have with your health care provider. Document Released: 02/12/2009 Document Revised: 09/24/2015 Document Reviewed: 05/23/2014 Elsevier Interactive Patient Education  2017 [employer].

## 2019-07-04 ENCOUNTER — Other Ambulatory Visit: Payer: Self-pay

## 2019-07-04 ENCOUNTER — Ambulatory Visit: Payer: Self-pay

## 2019-07-04 ENCOUNTER — Ambulatory Visit (INDEPENDENT_AMBULATORY_CARE_PROVIDER_SITE_OTHER): Payer: PPO | Admitting: Family Medicine

## 2019-07-04 ENCOUNTER — Encounter: Payer: Self-pay | Admitting: Family Medicine

## 2019-07-04 VITALS — BP 145/72 | HR 80 | Temp 97.1°F | Resp 16 | Ht 63.5 in | Wt 145.0 lb

## 2019-07-04 DIAGNOSIS — E663 Overweight: Secondary | ICD-10-CM | POA: Insufficient documentation

## 2019-07-04 DIAGNOSIS — Z0000 Encounter for general adult medical examination without abnormal findings: Secondary | ICD-10-CM

## 2019-07-04 DIAGNOSIS — E1169 Type 2 diabetes mellitus with other specified complication: Secondary | ICD-10-CM | POA: Diagnosis not present

## 2019-07-04 DIAGNOSIS — I10 Essential (primary) hypertension: Secondary | ICD-10-CM | POA: Diagnosis not present

## 2019-07-04 DIAGNOSIS — R300 Dysuria: Secondary | ICD-10-CM | POA: Diagnosis not present

## 2019-07-04 DIAGNOSIS — E785 Hyperlipidemia, unspecified: Secondary | ICD-10-CM

## 2019-07-04 LAB — POCT URINALYSIS DIPSTICK
Bilirubin, UA: NEGATIVE
Blood, UA: NEGATIVE
Glucose, UA: NEGATIVE
Ketones, UA: NEGATIVE
Nitrite, UA: POSITIVE
Protein, UA: NEGATIVE
Spec Grav, UA: 1.02 (ref 1.010–1.025)
Urobilinogen, UA: 0.2 E.U./dL
pH, UA: 6 (ref 5.0–8.0)

## 2019-07-04 MED ORDER — CEPHALEXIN 500 MG PO CAPS: 500.0000 mg | ORAL_CAPSULE | Freq: Two times a day (BID) | ORAL | 0 refills | Status: AC

## 2019-07-04 NOTE — Patient Instructions (Signed)
Preventive Care 81 Years and Older, Female Preventive care refers to lifestyle choices and visits with your health care provider that can promote health and wellness. This includes:  A yearly physical exam. This is also called an annual well check.  Regular dental and eye exams.  Immunizations.  Screening for certain conditions.  Healthy lifestyle choices, such as diet and exercise. What can I expect for my preventive care visit? Physical exam Your health care provider will check:  Height and weight. These may be used to calculate body mass index (BMI), which is a measurement that tells if you are at a healthy weight.  Heart rate and blood pressure.  Your skin for abnormal spots. Counseling Your health care provider may ask you questions about:  Alcohol, tobacco, and drug use.  Emotional well-being.  Home and relationship well-being.  Sexual activity.  Eating habits.  History of falls.  Memory and ability to understand (cognition).  Work and work Statistician.  Pregnancy and menstrual history. What immunizations do I need?  Influenza (flu) vaccine  This is recommended every year. Tetanus, diphtheria, and pertussis (Tdap) vaccine  You may need a Td booster every 10 years. Varicella (chickenpox) vaccine  You may need this vaccine if you have not already been vaccinated. Zoster (shingles) vaccine  You may need this after age 33. Pneumococcal conjugate (PCV13) vaccine  One dose is recommended after age 33. Pneumococcal polysaccharide (PPSV23) vaccine  One dose is recommended after age 72. Measles, mumps, and rubella (MMR) vaccine  You may need at least one dose of MMR if you were born in 1957 or later. You may also need a second dose. Meningococcal conjugate (MenACWY) vaccine  You may need this if you have certain conditions. Hepatitis A vaccine  You may need this if you have certain conditions or if you travel or work in places where you may be exposed  to hepatitis A. Hepatitis B vaccine  You may need this if you have certain conditions or if you travel or work in places where you may be exposed to hepatitis B. Haemophilus influenzae type b (Hib) vaccine  You may need this if you have certain conditions. You may receive vaccines as individual doses or as more than one vaccine together in one shot (combination vaccines). Talk with your health care provider about the risks and benefits of combination vaccines. What tests do I need? Blood tests  Lipid and cholesterol levels. These may be checked every 5 years, or more frequently depending on your overall health.  Hepatitis C test.  Hepatitis B test. Screening  Lung cancer screening. You may have this screening every year starting at age 39 if you have a 30-pack-year history of smoking and currently smoke or have quit within the past 15 years.  Colorectal cancer screening. All adults should have this screening starting at age 36 and continuing until age 15. Your health care provider may recommend screening at age 23 if you are at increased risk. You will have tests every 1-10 years, depending on your results and the type of screening test.  Diabetes screening. This is done by checking your blood sugar (glucose) after you have not eaten for a while (fasting). You may have this done every 1-3 years.  Mammogram. This may be done every 1-2 years. Talk with your health care provider about how often you should have regular mammograms.  BRCA-related cancer screening. This may be done if you have a family history of breast, ovarian, tubal, or peritoneal cancers.  Other tests  Sexually transmitted disease (STD) testing.  Bone density scan. This is done to screen for osteoporosis. You may have this done starting at age 44. Follow these instructions at home: Eating and drinking  Eat a diet that includes fresh fruits and vegetables, whole grains, lean protein, and low-fat dairy products. Limit  your intake of foods with high amounts of sugar, saturated fats, and salt.  Take vitamin and mineral supplements as recommended by your health care provider.  Do not drink alcohol if your health care provider tells you not to drink.  If you drink alcohol: ? Limit how much you have to 0-1 drink a day. ? Be aware of how much alcohol is in your drink. In the [place] one drink equals one 12 oz bottle of beer (355 mL), one 5 oz glass of wine (148 mL), or one 1 oz glass of hard liquor (44 mL). Lifestyle  Take daily care of your teeth and gums.  Stay active. Exercise for at least 30 minutes on 5 or more days each week.  Do not use any products that contain nicotine or tobacco, such as cigarettes, e-cigarettes, and chewing tobacco. If you need help quitting, ask your health care provider.  If you are sexually active, practice safe sex. Use a condom or other form of protection in order to prevent STIs (sexually transmitted infections).  Talk with your health care provider about taking a low-dose aspirin or statin. What's next?  Go to your health care provider once a year for a well check visit.  Ask your health care provider how often you should have your eyes and teeth checked.  Stay up to date on all vaccines. This information is not intended to replace advice given to you by your health care provider. Make sure you discuss any questions you have with your health care provider. Document Revised: 04/12/2018 Document Reviewed: 04/12/2018 Elsevier Patient Education  2020 [employer].

## 2019-07-04 NOTE — Progress Notes (Signed)
Patient: Jessica Wheeler, Female    DOB: 1941-02-21, 79 y.o.   MRN: [id number] Visit Date: 07/05/2019  Today's Provider: Shirlee Latch, MD   Chief Complaint  Patient presents with  . Annual Exam  . Urinary Tract Infection   Subjective:     Complete Physical MARIANNE GOLIGHTLY is a 79 y.o. female. She feels well. She reports exercising daily. She reports she is sleeping well. 07/03/2019 AWV 04/29/2019 Mammogram-BI-RADS 1 04/04/2017 Colonoscopy-Diverticulosis -----------------------------------------------------------  Urinary Tract Infection: Patient complains of burning with urination and foul smelling urine She has had symptoms for1wk.  Patient also complains of denies any other symptom. Patient denies back pain, fever and vaginal discharge. Patient does have a history of recurrent UTI.  Patient does not have a history of pyelonephritis.   Tried uristat x2 with mild relief.   Review of Systems  Constitutional: Negative.   HENT: Negative.   Eyes: Negative.   Respiratory: Negative.   Cardiovascular: Negative.   Gastrointestinal: Negative.   Endocrine: Positive for polyuria.  Genitourinary: Positive for dysuria.  Musculoskeletal: Positive for myalgias.  Skin: Negative.   Allergic/Immunologic: Positive for environmental allergies.  Neurological: Negative.   Hematological: Negative.   Psychiatric/Behavioral: Negative.     Social History   Socioeconomic History  . Marital status: Widowed    Spouse name: Not on file  . Number of children: 2  . Years of education: 71  . Highest education level: Associate degree: academic program  Occupational History  . Occupation: Retired  Tobacco Use  . Smoking status: Former Smoker    Packs/day: 1.00    Years: 15.00    Pack years: 15.00    Types: Cigarettes    Quit date: 08/03/2003    Years since quitting: 15.9  . Smokeless tobacco: Never Used  Substance and Sexual Activity  . Alcohol use: No  . Drug use: No  . Sexual  activity: Not on file  Other Topics Concern  . Not on file  Social History Narrative  . Not on file   Social Determinants of Health   Financial Resource Strain: Low Risk   . Difficulty of Paying Living Expenses: Not hard at all  Food Insecurity: No Food Insecurity  . Worried About Programme researcher, broadcasting/film/video in the Last Year: Never true  . Ran Out of Food in the Last Year: Never true  Transportation Needs: No Transportation Needs  . Lack of Transportation (Medical): No  . Lack of Transportation (Non-Medical): No  Physical Activity: Sufficiently Active  . Days of Exercise per Week: 5 days  . Minutes of Exercise per Session: 40 min  Stress: No Stress Concern Present  . Feeling of Stress : Not at all  Social Connections: Moderately Isolated  . Frequency of Communication with Friends and Family: More than three times a week  . Frequency of Social Gatherings with Friends and Family: Once a week  . Attends Religious Services: Never  . Active Member of Clubs or Organizations: No  . Attends Banker Meetings: Never  . Marital Status: Widowed  Intimate Partner Violence: Not At Risk  . Fear of Current or Ex-Partner: No  . Emotionally Abused: No  . Physically Abused: No  . Sexually Abused: No    Past Medical History:  Diagnosis Date  . Allergy   . Arthritis   . Bundle branch block, right   . Diabetes mellitus   . Diffuse cystic mastopathy   . Dissection, aorta (HCC)  Dr.Dew follows  . Diverticulosis   . Family history of malignant neoplasm of gastrointestinal tract   . Glaucoma 2005  . Heart murmur   . History of bronchitis   . History of pancreatitis 2005  . Hypertension 1982  . Personal history of tobacco use, presenting hazards to health   . RBBB   . Ulcer      Patient Active Problem List   Diagnosis Date Noted  . Overweight 07/04/2019  . Hyperlipidemia associated with type 2 diabetes mellitus (HCC) 03/15/2019  . Fatigue 12/11/2017  . Lower extremity edema  06/29/2017  . Primary osteoarthritis of left knee 03/10/2016  . Depression 12/28/2015  . Allergic rhinitis 01/31/2015  . Benign paroxysmal positional nystagmus 01/31/2015  . Bladder infection, chronic 01/31/2015  . Glaucoma 01/31/2015  . Alopecia 01/31/2015  . Chronic hyponatremia 01/31/2015  . Muscle spasm 01/31/2015  . 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 Anxiety 11/04/2014  . Muscle weakness 06/24/2014  . Fibrocystic breast disease 04/30/2013  . 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 Renal artery stenosis (HCC) 04/18/2012  . Murmur 10/10/2011  . 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HCC) 03/31/2011  . HTN (hypertension) 08/03/2010  . CAD (coronary artery disease) 08/03/2010  . DM (diabetes mellitus) (HCC) 08/03/2010    Past Surgical History:  Procedure Laterality Date  . ABDOMINAL HYSTERECTOMY  1991  . APPENDECTOMY    . BACK SURGERY  1982   for lumbar ruptured disc. No hardware, no discectomy, no fusion  . BLADDER SURGERY    . BREAST BIOPSY Right 1980's   benign  . BREAST EXCISIONAL BIOPSY Right 1980s   benign  . BREAST SURGERY     biopsy  . CARDIAC CATHETERIZATION     Dr. Lady Gary, few years ago  . CATARACT EXTRACTION W/PHACO Left 08/31/2015   Procedure: CATARACT EXTRACTION PHACO AND INTRAOCULAR LENS PLACEMENT (IOC);  Surgeon: Sherald Hess, MD;  Location: [facility];  Service: Ophthalmology;  Laterality: Left;  DIABETIC - oral meds  . CATARACT EXTRACTION W/PHACO Right 10/12/2015   Procedure: CATARACT EXTRACTION PHACO AND INTRAOCULAR LENS PLACEMENT (IOC) right eye;  Surgeon: Sherald Hess, MD;  Location: [facility];  Service: Ophthalmology;  Laterality: Right;  DIABETIC - oral meds  . CHOLECYSTECTOMY  1994  . COLONOSCOPY  2008,02/22/2012   Dr. Sankar-2013  . COLONOSCOPY WITH PROPOFOL N/A 04/04/2017   Procedure: COLONOSCOPY WITH PROPOFOL;  Surgeon: [name], MD;  Location: [facility];  Service: Endoscopy;   Laterality: N/A;  . REFRACTIVE SURGERY      Her family history includes Anxiety disorder in her mother; Breast cancer (age of onset: 33) in her cousin; Breast cancer (age of onset: 82) in her maternal aunt; Colon cancer in her brother; Diabetes in her mother; Heart attack in her father; Heart disease in her father; Hypertension in her maternal grandfather, maternal grandmother, and mother; Rheum arthritis in her father.   Current Outpatient Medications:  .  amiloride-hydrochlorothiazide (MODURETIC) 5-50 MG tablet, TAKE 1/2 TABLET BY MOUTH DAILY AS NEEDED FOR SWELLING, Disp: 45 tablet, Rfl: 2 .  amLODipine (NORVASC) 5 MG tablet, TAKE 1 TABLET(5 MG) BY MOUTH DAILY, Disp: 90 tablet, Rfl: 2 .  aspirin 81 MG EC tablet, Take 81 mg by mouth daily.  , Disp: , Rfl:  .  Bioflavonoid Products (ESTER C PO), Take 500 mg by mouth daily. occassionally takes 2 tablets, Disp: , Rfl:  .  Cyanocobalamin (VITAMIN B-12) 1000 MCG/15ML LIQD, Take 1,000 mcg by mouth daily. , Disp: ,  Rfl:  .  glucose blood (FREESTYLE TEST STRIPS) test strip, Use as instructed, Disp: 100 each, Rfl: 12 .  Ibuprofen (ADVIL) 200 MG CAPS, Take 1 capsule by mouth daily.  , Disp: , Rfl:  .  latanoprost (XALATAN) 0.005 % ophthalmic solution, Reported on 10/12/2015 - both eyes nightly, Disp: , Rfl: 3 .  losartan (COZAAR) 100 MG tablet, TAKE 1 TABLET BY MOUTH EVERY DAY, Disp: 90 tablet, Rfl: 2 .  MAGNESIUM PO, Take 250 mg by mouth daily. , Disp: , Rfl:  .  metFORMIN (GLUCOPHAGE) 500 MG tablet, TAKE 2 TABLETS(1000 MG) BY MOUTH TWICE DAILY WITH MEALS, Disp: 360 tablet, Rfl: 3 .  phenazopyridine (URISTAT) 95 MG tablet, Take 95 mg by mouth 3 (three) times daily as needed for pain., Disp: , Rfl:  .  Baclofen 5 MG TABS, Take 10 mg by mouth 3 (three) times daily as needed. (Patient not taking: Reported on 07/03/2019), Disp: 90 tablet, Rfl: 1 .  cephALEXin (KEFLEX) 500 MG capsule, Take 1 capsule (500 mg total) by mouth 2 (two) times daily for 7 days., Disp:  14 capsule, Rfl: 0 .  ezetimibe (ZETIA) 10 MG tablet, Take 1 tablet (10 mg total) by mouth daily. (Patient not taking: Reported on 07/03/2019), Disp: 30 tablet, Rfl: 11  Patient Care Team: Erasmo Downer, MD as PCP - General (Family Medicine) Antonieta Iba, MD as Consulting Physician (Cardiology) Candelaria Stagers, DPM as Consulting Physician (Podiatry) Larence Penning, Sheppard Plumber, OD (Optometry)     Objective:    Vitals: BP (!) 156/72 (BP Location: Left Arm, Patient Position: Sitting, Cuff Size: Normal)   Pulse 80   Temp (!) 97.1 F (36.2 C) (Temporal)   Resp 16   Ht 5' 3.5" (1.613 m)   Wt 145 lb (65.8 kg)   BMI 25.28 kg/m   Physical Exam Vitals reviewed.  Constitutional:      General: She is not in acute distress.    Appearance: Normal appearance. She is well-developed. She is not diaphoretic.  HENT:     Head: Normocephalic and atraumatic.     Right Ear: Tympanic membrane, ear canal and external ear normal.     Left Ear: Tympanic membrane, ear canal and external ear normal.  Eyes:     General: No scleral icterus.    Conjunctiva/sclera: Conjunctivae normal.     Pupils: Pupils are equal, round, and reactive to light.  Neck:     Thyroid: No thyromegaly.  Cardiovascular:     Rate and Rhythm: Normal rate and regular rhythm.     Pulses: Normal pulses.     Heart sounds: Normal heart sounds. No murmur.  Pulmonary:     Effort: Pulmonary effort is normal. No respiratory distress.     Breath sounds: Normal breath sounds. No wheezing or rales.  Abdominal:     General: There is no distension.     Palpations: Abdomen is soft.     Tenderness: There is no abdominal tenderness. There is no right CVA tenderness or left CVA tenderness.  Musculoskeletal:        General: No deformity.     Cervical back: Neck supple.     Right lower leg: No edema.     Left lower leg: No edema.  Lymphadenopathy:     Cervical: No cervical adenopathy.  Skin:    General: Skin is warm and dry.     Findings: No  rash.  Neurological:     Mental Status: She is alert and oriented  to person, place, and time. Mental status is at baseline.  Psychiatric:        Mood and Affect: Mood normal.        Behavior: Behavior normal.        Thought Content: Thought content normal.     Activities of Daily Living In your present state of health, do you have any difficulty performing the following activities: 07/03/2019  Hearing? N  Vision? N  Difficulty concentrating or making decisions? N  Walking or climbing stairs? N  Dressing or bathing? N  Doing errands, shopping? N  Preparing Food and eating ? N  Using the Toilet? N  In the past six months, have you accidently leaked urine? N  Do you have problems with loss of bowel control? N  Managing your Medications? N  Managing your Finances? N  Housekeeping or managing your Housekeeping? N  Some recent data might be hidden    Fall Risk Assessment Fall Risk  07/03/2019 07/03/2018 07/03/2018 07/02/2018 06/18/2018  Falls in the past year? 0 1 1 1  0  Number falls in past yr: 0 0 0 0 0  Injury with Fall? 0 1 1 0 0  Comment - - - just bruised -  Follow up - - - Falls prevention discussed -     Depression Screen PHQ 2/9 Scores 07/03/2019 07/03/2018 07/03/2018 07/02/2018  PHQ - 2 Score 0 0 0 0  PHQ- 9 Score - 0 - -   6CIT Screen 07/04/2019  What Year? 0 points  What month? 0 points  What time? 0 points  Count back from 20 0 points  Months in reverse 0 points  Repeat phrase 4 points  Total Score 4    Audit-C Alcohol Use Screening   Alcohol Use Disorder Test (AUDIT) 12/11/2017 07/03/2018 07/03/2019  1. How often do you have a drink containing alcohol? 0 0 0  2. How many drinks containing alcohol do you have on a typical day when you are drinking? 0 - 0  3. How often do you have six or more drinks on one occasion? 0 0 0  AUDIT-C Score 0 - 0    A score of 3 or more in women, and 4 or more in men indicates increased risk for alcohol abuse, EXCEPT if all of the points are from  question 1  6CIT Screen 07/04/2019  What Year? 0 points  What month? 0 points  What time? 0 points  Count back from 20 0 points  Months in reverse 0 points  Repeat phrase 4 points  Total Score 4       Assessment & Plan:    Annual Physical Reviewed patient's Family Medical History Reviewed and updated list of patient's medical providers Assessment of cognitive impairment was done Assessed patient's functional ability Established a written schedule for health screening [place] Completed and Reviewed  Exercise Activities and Dietary recommendations Goals    . DIET - INCREASE WATER INTAKE     Recommend increasing water intake to 6-8 glasses a day.        Immunization History  Administered Date(s) Administered  . Fluad Quad(high Dose 65+) 02/26/2019  . Influenza, High Dose Seasonal PF 02/03/2017, 01/20/2018  . Influenza-Unspecified 01/28/2016, 01/30/2017  . Pneumococcal Conjugate-13 09/10/2013, 02/26/2014  . Pneumococcal Polysaccharide-23 08/23/2010  . Zoster 06/14/2011    Health Maintenance  Topic Date Due  . TETANUS/TDAP  01/23/2023 (Originally 02/08/1960)  . OPHTHALMOLOGY EXAM  11/19/2019  . HEMOGLOBIN A1C  01/04/2020  .  FOOT EXAM  07/03/2020  . INFLUENZA VACCINE  Completed  . DEXA SCAN  Completed  . PNA vac Low Risk Adult  Completed     Discussed health benefits of physical activity, and encouraged her to engage in regular exercise appropriate for her age and condition.    ------------------------------------------------------------------------------------------------------------  Problem List Items Addressed This Visit      Cardiovascular and Mediastinum   HTN (hypertension)    Uncontrolled Believes that BP is elevated in setting of recent loss of her dog and current UTI No changes to medications, but will monitor home BPs Recheck metabolic panel      Relevant Orders   Basic Metabolic Panel (BMET) (Completed)     Endocrine    DM (diabetes mellitus) (HCC)    Previously uncontrolled Recheck A1c UTD on screenings and vaccines Diet and exercise Continue metformin Likely need to consider adding additional medication - did not tolerate Jardiance due to cramping but thinks she could hydrate better if she tried another medication in the same class Also consider GLP1 Not on statin as below F/u in 3 months      Relevant Orders   Hemoglobin A1c (Completed)   Hyperlipidemia associated with type 2 diabetes mellitus (HCC)    Reviewed last lipid panel Has not started Zetia Has seen cardiology She is not on statin due to hesitance related to myalgias Recheck lipids after starting Zetia Goal LDL <70        Other   Overweight    Discussed importance of healthy weight management Discussed diet and exercise        Other Visit Diagnoses    Encounter for annual physical exam    -  Primary   Relevant Orders   Basic Metabolic Panel (BMET) (Completed)   Hemoglobin A1c (Completed)   Dysuria       Relevant Orders   POCT urinalysis dipstick (Completed)   Urine Culture    - Symptoms and UA consistent with UTI -No systemic symptoms or signs of pyelonephritis -Will start treatment with 7day course of Keflex after reviewing previous urine culture  -We will send urine culture to confirm sensitivities -Discussed return precautions    Return in about 3 months (around 10/04/2019) for chronic disease f/u.   The entirety of the information documented in the History of Present Illness, Review of Systems and Physical Exam were personally obtained by me. Portions of this information were initially documented by Rondel Baton, CMA and reviewed by me for thoroughness and accuracy.    Jaquita Bessire, Marzella Schlein, MD MPH [facility]

## 2019-07-05 ENCOUNTER — Telehealth: Payer: Self-pay

## 2019-07-05 LAB — BASIC METABOLIC PANEL
BUN/Creatinine Ratio: 17 (ref 12–28)
BUN: 15 mg/dL (ref 8–27)
CO2: 20 mmol/L (ref 20–29)
Calcium: 9.9 mg/dL (ref 8.7–10.3)
Chloride: 95 mmol/L — ABNORMAL LOW (ref 96–106)
Creatinine, Ser: 0.86 mg/dL (ref 0.57–1.00)
GFR calc Af Amer: 74 mL/min/{1.73_m2} (ref >59)
GFR calc non Af Amer: 64 mL/min/{1.73_m2} (ref >59)
Glucose: 130 mg/dL — ABNORMAL HIGH (ref 65–99)
Potassium: 3.8 mmol/L (ref 3.5–5.2)
Sodium: 134 mmol/L (ref 134–144)

## 2019-07-05 LAB — HEMOGLOBIN A1C
Est. average glucose Bld gHb Est-mCnc: 154 mg/dL
Hgb A1c MFr Bld: 7 % — ABNORMAL HIGH (ref 4.8–5.6)

## 2019-07-05 NOTE — Assessment & Plan Note (Signed)
Uncontrolled Believes that BP is elevated in setting of recent loss of her dog and current UTI No changes to medications, but will monitor home BPs Recheck metabolic panel

## 2019-07-05 NOTE — Telephone Encounter (Signed)
LMTCB 07/05/2019  PEC please advise labs below.   Thanks,   -Vernona Rieger

## 2019-07-05 NOTE — Assessment & Plan Note (Signed)
Reviewed last lipid panel Has not started Zetia Has seen cardiology She is not on statin due to hesitance related to myalgias Recheck lipids after starting Zetia Goal LDL <70

## 2019-07-05 NOTE — Telephone Encounter (Signed)
-----   Message from Erasmo Downer, MD sent at 07/05/2019 12:48 PM EST ----- Normal kidney function, electrolytes.  A1c has improved some and is now down to 7.  No changes to medications at this time.

## 2019-07-05 NOTE — Assessment & Plan Note (Signed)
Previously uncontrolled Recheck A1c UTD on screenings and vaccines Diet and exercise Continue metformin Likely need to consider adding additional medication - did not tolerate Jardiance due to cramping but thinks she could hydrate better if she tried another medication in the same class Also consider GLP1 Not on statin as below F/u in 3 months

## 2019-07-05 NOTE — Assessment & Plan Note (Signed)
Discussed importance of healthy weight management Discussed diet and exercise  

## 2019-07-05 NOTE — Telephone Encounter (Signed)
Pt given lab results per notes of Dr. Beryle Flock on 07/05/19. Pt verbalized understanding.

## 2019-07-06 LAB — URINE CULTURE

## 2019-07-08 ENCOUNTER — Telehealth: Payer: Self-pay

## 2019-07-08 NOTE — Telephone Encounter (Signed)
Lmtcb, pec may give results. 

## 2019-07-08 NOTE — Telephone Encounter (Signed)
-----   Message from Erasmo Downer, MD sent at 07/08/2019 10:45 AM EST ----- Urine culture confirms UTI that is intermediate sensitive to abx prescribed.  Hope she is doing better.  If still having symptoms, can switch to different abx, like Cipro

## 2019-07-08 NOTE — Telephone Encounter (Signed)
Pt notified of message regarding her urine specimen. Pt voiced understanding. She stated that she had taken the antibiotic that was prescribed and feels like it has helped some. The odor has subsided but still has the burning and wants to wait one more day and will call back if the burning is not better.

## 2019-07-09 ENCOUNTER — Other Ambulatory Visit: Payer: Self-pay

## 2019-07-09 ENCOUNTER — Ambulatory Visit: Payer: PPO | Admitting: Orthotics

## 2019-07-09 MED ORDER — CIPROFLOXACIN HCL 500 MG PO TABS: 500.0000 mg | ORAL_TABLET | Freq: Two times a day (BID) | ORAL | 0 refills | Status: DC

## 2019-07-09 NOTE — Addendum Note (Signed)
Addended by: Hyacinth Meeker on: 07/09/2019 11:23 AM   Modules accepted: Orders

## 2019-07-09 NOTE — Telephone Encounter (Signed)
Cipro is just as good as Levaquin.  I would recommend Cipro over Levaquin due to side effect profiles.  Okay to prescribe ciprofloxacin 500 mg twice daily x5 days, #10, 0 refills

## 2019-07-09 NOTE — Telephone Encounter (Signed)
Patient called in stating she would like Levaquin prescribed instead of cipro. Please advise.

## 2019-07-09 NOTE — Telephone Encounter (Signed)
Patient advised as below. Patient verbalizes understanding and is in agreement with treatment plan.  

## 2019-07-17 ENCOUNTER — Ambulatory Visit: Payer: Self-pay | Admitting: *Deleted

## 2019-07-17 ENCOUNTER — Other Ambulatory Visit: Payer: Self-pay

## 2019-07-17 ENCOUNTER — Ambulatory Visit (INDEPENDENT_AMBULATORY_CARE_PROVIDER_SITE_OTHER): Payer: PPO | Admitting: Family Medicine

## 2019-07-17 ENCOUNTER — Encounter: Payer: Self-pay | Admitting: Family Medicine

## 2019-07-17 VITALS — BP 139/72 | HR 80 | Temp 96.9°F | Resp 16 | Wt 143.8 lb

## 2019-07-17 DIAGNOSIS — N3090 Cystitis, unspecified without hematuria: Secondary | ICD-10-CM

## 2019-07-17 DIAGNOSIS — R1030 Lower abdominal pain, unspecified: Secondary | ICD-10-CM | POA: Diagnosis not present

## 2019-07-17 LAB — POCT URINALYSIS DIPSTICK
Bilirubin, UA: NEGATIVE
Blood, UA: NEGATIVE
Glucose, UA: NEGATIVE
Ketones, UA: NEGATIVE
Nitrite, UA: NEGATIVE
Protein, UA: NEGATIVE
Spec Grav, UA: 1.02 (ref 1.010–1.025)
Urobilinogen, UA: 0.2 E.U./dL
pH, UA: 6 (ref 5.0–8.0)

## 2019-07-17 MED ORDER — CIPROFLOXACIN HCL 500 MG PO TABS: 500.0000 mg | ORAL_TABLET | Freq: Two times a day (BID) | ORAL | 0 refills | Status: AC

## 2019-07-17 NOTE — Patient Instructions (Signed)
Urinary Tract Infection, Adult A urinary tract infection (UTI) is an infection of any part of the urinary tract. The urinary tract includes:  The kidneys.  The ureters.  The bladder.  The urethra. These organs make, store, and get rid of pee (urine) in the body. What are the causes? This is caused by germs (bacteria) in your genital area. These germs grow and cause swelling (inflammation) of your urinary tract. What increases the risk? You are more likely to develop this condition if:  You have a small, thin tube (catheter) to drain pee.  You cannot control when you pee or poop (incontinence).  You are female, and: ? You use these methods to prevent pregnancy:  A medicine that kills sperm (spermicide).  A device that blocks sperm (diaphragm). ? You have low levels of a female hormone (estrogen). ? You are pregnant.  You have genes that add to your risk.  You are sexually active.  You take antibiotic medicines.  You have trouble peeing because of: ? A prostate that is bigger than normal, if you are female. ? A blockage in the part of your body that drains pee from the bladder (urethra). ? A kidney stone. ? A nerve condition that affects your bladder (neurogenic bladder). ? Not getting enough to drink. ? Not peeing often enough.  You have other conditions, such as: ? Diabetes. ? A weak disease-fighting system (immune system). ? Sickle cell disease. ? Gout. ? Injury of the spine. What are the signs or symptoms? Symptoms of this condition include:  Needing to pee right away (urgently).  Peeing often.  Peeing small amounts often.  Pain or burning when peeing.  Blood in the pee.  Pee that smells bad or not like normal.  Trouble peeing.  Pee that is cloudy.  Fluid coming from the vagina, if you are female.  Pain in the belly or lower back. Other symptoms include:  Throwing up (vomiting).  No urge to eat.  Feeling mixed up (confused).  Being tired  and grouchy (irritable).  A fever.  Watery poop (diarrhea). How is this treated? This condition may be treated with:  Antibiotic medicine.  Other medicines.  Drinking enough water. Follow these instructions at home:  Medicines  Take over-the-counter and prescription medicines only as told by your doctor.  If you were prescribed an antibiotic medicine, take it as told by your doctor. Do not stop taking it even if you start to feel better. General instructions  Make sure you: ? Pee until your bladder is empty. ? Do not hold pee for a long time. ? Empty your bladder after sex. ? Wipe from front to back after pooping if you are a female. Use each tissue one time when you wipe.  Drink enough fluid to keep your pee pale yellow.  Keep all follow-up visits as told by your doctor. This is important. Contact a doctor if:  You do not get better after 1-2 days.  Your symptoms go away and then come back. Get help right away if:  You have very bad back pain.  You have very bad pain in your lower belly.  You have a fever.  You are sick to your stomach (nauseous).  You are throwing up. Summary  A urinary tract infection (UTI) is an infection of any part of the urinary tract.  This condition is caused by germs in your genital area.  There are many risk factors for a UTI. These include having a small, thin   tube to drain pee and not being able to control when you pee or poop.  Treatment includes antibiotic medicines for germs.  Drink enough fluid to keep your pee pale yellow. This information is not intended to replace advice given to you by your health care provider. Make sure you discuss any questions you have with your health care provider. Document Revised: 04/05/2018 Document Reviewed: 10/26/2017 Elsevier Patient Education  2020 [employer]  

## 2019-07-17 NOTE — Progress Notes (Signed)
Patient: Jessica Wheeler Female    DOB: [birth year]   79 y.o.   MRN: [id number] Visit Date: 07/17/2019  Today's Provider: Shirlee Latch, MD   Chief Complaint  Patient presents with  . Abdominal Pain   Subjective:     HPI Patient here today C/O lower abdominal pain x's 1 day. Patient concerned about possible yeast infection, but feels like this cleared after drinking buttermilk. Patient reports that she has just finished two rounds of antibiotics for UTI. Patient denies fever, chills, painful urination or burning, or abnormal vaginal discharge. Patient reports abdominal pain is constant and aching   Allergies  Allergen Reactions  . Beta Adrenergic Blockers     Chest pressure/difficulty breathing  . Jardiance [Empagliflozin]     Muscle cramps  . Levaquin [Levofloxacin In D5w]     Low blood sugar; does take this when she needs it. She just makes sure to control her BS to avoid hypoglycemia.  . Levofloxacin   . Lisinopril Cough  . Prevnar [Pneumococcal 13-Val Conj Vacc] Rash    Joint Pain  . Sulfa Antibiotics Rash    "makes my skin raw"     Current Outpatient Medications:  .  amiloride-hydrochlorothiazide (MODURETIC) 5-50 MG tablet, TAKE 1/2 TABLET BY MOUTH DAILY AS NEEDED FOR SWELLING, Disp: 45 tablet, Rfl: 2 .  amLODipine (NORVASC) 5 MG tablet, TAKE 1 TABLET(5 MG) BY MOUTH DAILY, Disp: 90 tablet, Rfl: 2 .  aspirin 81 MG EC tablet, Take 81 mg by mouth daily.  , Disp: , Rfl:  .  Bioflavonoid Products (ESTER C PO), Take 500 mg by mouth daily. occassionally takes 2 tablets, Disp: , Rfl:  .  Cyanocobalamin (VITAMIN B-12) 1000 MCG/15ML LIQD, Take 1,000 mcg by mouth daily. , Disp: , Rfl:  .  glucose blood (FREESTYLE TEST STRIPS) test strip, Use as instructed, Disp: 100 each, Rfl: 12 .  Ibuprofen (ADVIL) 200 MG CAPS, Take 1 capsule by mouth daily.  , Disp: , Rfl:  .  latanoprost (XALATAN) 0.005 % ophthalmic solution, Reported on 10/12/2015 - both eyes nightly, Disp: , Rfl: 3 .   losartan (COZAAR) 100 MG tablet, TAKE 1 TABLET BY MOUTH EVERY DAY, Disp: 90 tablet, Rfl: 2 .  MAGNESIUM PO, Take 250 mg by mouth daily. , Disp: , Rfl:  .  metFORMIN (GLUCOPHAGE) 500 MG tablet, TAKE 2 TABLETS(1000 MG) BY MOUTH TWICE DAILY WITH MEALS, Disp: 360 tablet, Rfl: 3 .  phenazopyridine (URISTAT) 95 MG tablet, Take 95 mg by mouth 3 (three) times daily as needed for pain., Disp: , Rfl:  .  Baclofen 5 MG TABS, Take 10 mg by mouth 3 (three) times daily as needed. (Patient not taking: Reported on 07/03/2019), Disp: 90 tablet, Rfl: 1 .  ciprofloxacin (CIPRO) 500 MG tablet, Take 1 tablet (500 mg total) by mouth 2 (two) times daily for 7 days., Disp: 14 tablet, Rfl: 0 .  ezetimibe (ZETIA) 10 MG tablet, Take 1 tablet (10 mg total) by mouth daily. (Patient not taking: Reported on 07/03/2019), Disp: 30 tablet, Rfl: 11  Review of Systems  Constitutional: Negative.   Cardiovascular: Negative.   Gastrointestinal: Positive for abdominal pain. Negative for blood in stool, constipation, diarrhea, nausea and vomiting.  Genitourinary: Positive for flank pain. Negative for dysuria, hematuria, vaginal bleeding, vaginal discharge and vaginal pain.  Musculoskeletal: Positive for back pain.    Social History   Tobacco Use  . Smoking status: Former Smoker    Packs/day: 1.00    Years:  15.00    Pack years: 15.00    Types: Cigarettes    Quit date: 08/03/2003    Years since quitting: 15.9  . Smokeless tobacco: Never Used  Substance Use Topics  . Alcohol use: No      Objective:   BP 139/72 (BP Location: Left Arm, Patient Position: Sitting, Cuff Size: Normal)   Pulse 80   Temp (!) 96.9 F (36.1 C) (Temporal)   Resp 16   Wt 143 lb 12.8 oz (65.2 kg)   BMI 25.07 kg/m  Vitals:   07/17/19 1604  BP: 139/72  Pulse: 80  Resp: 16  Temp: (!) 96.9 F (36.1 C)  TempSrc: Temporal  Weight: 143 lb 12.8 oz (65.2 kg)  Body mass index is 25.07 kg/m.   Physical Exam Vitals reviewed.  Constitutional:       General: She is not in acute distress.    Appearance: She is well-developed.  HENT:     Head: Normocephalic and atraumatic.  Eyes:     General: No scleral icterus.    Conjunctiva/sclera: Conjunctivae normal.  Cardiovascular:     Rate and Rhythm: Normal rate and regular rhythm.     Heart sounds: Normal heart sounds. No murmur.  Pulmonary:     Effort: Pulmonary effort is normal. No respiratory distress.     Breath sounds: Normal breath sounds. No wheezing or rales.  Abdominal:     General: There is no distension.     Palpations: Abdomen is soft.     Tenderness: There is abdominal tenderness in the suprapubic area. There is no right CVA tenderness, left CVA tenderness, guarding or rebound.  Skin:    General: Skin is warm and dry.     Findings: No rash.  Neurological:     Mental Status: She is alert and oriented to person, place, and time.  Psychiatric:        Behavior: Behavior normal.      Results for orders placed or performed in visit on 07/17/19  POCT urinalysis dipstick  Result Value Ref Range   Color, UA yellow    Clarity, UA clear    Glucose, UA Negative Negative   Bilirubin, UA Negative    Ketones, UA Negative    Spec Grav, UA 1.020 1.010 - 1.025   Blood, UA Negative    pH, UA 6.0 5.0 - 8.0   Protein, UA Negative Negative   Urobilinogen, UA 0.2 0.2 or 1.0 E.U./dL   Nitrite, UA Negative    Leukocytes, UA Moderate (2+) (A) Negative       Assessment & Plan     1. Lower abdominal pain 2. Cystitis - Symptoms and UA consistent with UTI -No systemic symptoms or signs of pyelonephritis -Will start treatment with 7day course of Cipro after reviewing previous urine culture -We will send urine culture to confirm sensitivities -Discussed return precautions  - Urine Culture   Meds ordered this encounter  Medications  . ciprofloxacin (CIPRO) 500 MG tablet    Sig: Take 1 tablet (500 mg total) by mouth 2 (two) times daily for 7 days.    Dispense:  14 tablet     Refill:  0     Return if symptoms worsen or fail to improve.   The entirety of the information documented in the History of Present Illness, Review of Systems and Physical Exam were personally obtained by me. Portions of this information were initially documented by Lynford Humphrey, CMA and reviewed by me for thoroughness and accuracy.  Allante Whitmire, Marzella Schlein, MD MPH [facility]

## 2019-07-17 NOTE — Telephone Encounter (Signed)
Patient had UTI and was treated- her medication was changed due to culture result. Patient treated her yeast infection( yoga rt/buttermilk) due to ATB use. Patient finished ATB over 1 week now. Patient now reports she is having pain in back and bottom of stomach- she is not sure if her UTI is coming back- or she has something else going on. Appointment scheduled.  Reason for Disposition . [1] MILD-MODERATE pain AND [2] constant AND [3] present > 2 hours  Answer Assessment - Initial Assessment Questions 1. LOCATION: "Where does it hurt?"      Lower abdominal pain 2. RADIATION: "Does the pain shoot anywhere else?" (e.g., chest, back)     Into back 3. ONSET: "When did the pain begin?" (e.g., minutes, hours or days ago)      Last night 4. SUDDEN: "Gradual or sudden onset?"     Sudden- woke patient up from sleep 5. PATTERN "Does the pain come and go, or is it constant?"    - If constant: "Is it getting better, staying the same, or worsening?"      (Note: Constant means the pain never goes away completely; most serious pain is constant and it progresses)     - If intermittent: "How long does it last?" "Do you have pain now?"     (Note: Intermittent means the pain goes away completely between bouts)     Dull- aching pain- constant 6. SEVERITY: "How bad is the pain?"  (e.g., Scale 1-10; mild, moderate, or severe)   - MILD (1-3): doesn't interfere with normal activities, abdomen soft and not tender to touch    - MODERATE (4-7): interferes with normal activities or awakens from sleep, tender to touch    - SEVERE (8-10): excruciating pain, doubled over, unable to do any normal activities      3-4 7. RECURRENT SYMPTOM: "Have you ever had this type of abdominal pain before?" If so, ask: "When was the last time?" and "What happened that time?"      Not in this area 8. CAUSE: "What do you think is causing the abdominal pain?"     Return of UTI? 9. RELIEVING/AGGRAVATING FACTORS: "What makes it better  or worse?" (e.g., movement, antacids, bowel movement)     Nothing- same 10. OTHER SYMPTOMS: "Has there been any vomiting, diarrhea, constipation, or urine problems?"       no 11. PREGNANCY: "Is there any chance you are pregnant?" "When was your last menstrual period?"       n/a  Protocols used: ABDOMINAL PAIN - [facility]

## 2019-07-18 DIAGNOSIS — N3090 Cystitis, unspecified without hematuria: Secondary | ICD-10-CM | POA: Diagnosis not present

## 2019-07-18 DIAGNOSIS — R1030 Lower abdominal pain, unspecified: Secondary | ICD-10-CM | POA: Diagnosis not present

## 2019-07-20 LAB — URINE CULTURE: Organism ID, Bacteria: NO GROWTH

## 2019-07-22 ENCOUNTER — Telehealth: Payer: Self-pay

## 2019-07-22 NOTE — Telephone Encounter (Signed)
-----   Message from Erasmo Downer, MD sent at 07/22/2019  8:56 AM EDT ----- Urine culture with no bacteria.  No true UTI.  Can discontinue antibiotics

## 2019-07-22 NOTE — Telephone Encounter (Signed)
[employer]   Result Notes and Comments to Patient Comment seen by patient Jessica Wheeler on 07/22/2019 9:06 AM EDT

## 2019-07-26 DIAGNOSIS — E119 Type 2 diabetes mellitus without complications: Secondary | ICD-10-CM | POA: Diagnosis not present

## 2019-07-26 DIAGNOSIS — H43813 Vitreous degeneration, bilateral: Secondary | ICD-10-CM | POA: Diagnosis not present

## 2019-07-26 DIAGNOSIS — Z961 Presence of intraocular lens: Secondary | ICD-10-CM | POA: Diagnosis not present

## 2019-07-26 DIAGNOSIS — H401131 Primary open-angle glaucoma, bilateral, mild stage: Secondary | ICD-10-CM | POA: Diagnosis not present

## 2019-07-26 DIAGNOSIS — Z7984 Long term (current) use of oral hypoglycemic drugs: Secondary | ICD-10-CM | POA: Diagnosis not present

## 2019-07-30 ENCOUNTER — Telehealth: Payer: Self-pay | Admitting: Family Medicine

## 2019-07-30 NOTE — Telephone Encounter (Signed)
Vernard Gambles NP with [facility] called saying she seen Jessica Wheeler for a home visit today and she was doing great BP 137/49 HR 71 O2 sat 99  Pt just lost her dog and was tearful about that but otherwise doing great.  She will be sending a note to this regard.

## 2019-07-30 NOTE — Telephone Encounter (Signed)
Noted  

## 2019-08-05 ENCOUNTER — Other Ambulatory Visit: Payer: Self-pay | Admitting: Family Medicine

## 2019-08-05 DIAGNOSIS — I10 Essential (primary) hypertension: Secondary | ICD-10-CM

## 2019-08-05 DIAGNOSIS — E1169 Type 2 diabetes mellitus with other specified complication: Secondary | ICD-10-CM

## 2019-08-07 ENCOUNTER — Other Ambulatory Visit: Payer: Self-pay | Admitting: Family Medicine

## 2019-08-07 DIAGNOSIS — E1159 Type 2 diabetes mellitus with other circulatory complications: Secondary | ICD-10-CM

## 2019-08-07 NOTE — Telephone Encounter (Signed)
Requested Prescriptions  Pending Prescriptions Disp Refills  . glucose blood (FREESTYLE TEST STRIPS) test strip [Pharmacy Med Name: FreeStyle Test In Vitro Strip] 100 each 5    Sig: USE AS DIRECTED     Endocrinology: Diabetes - Testing Supplies Passed - 08/07/2019  4:21 PM      Passed - Valid encounter within last 12 months    Recent Outpatient Visits          3 weeks ago Lower abdominal pain   [facility] [place], Marzella Schlein, MD   1 month ago Encounter for annual physical exam   [facility] [place], Marzella Schlein, MD   3 months ago Type 2 diabetes mellitus with other circulatory complication, without long-term current use of insulin [facility])   [facility], Marzella Schlein, MD   4 months ago Leg cramps   [facility] [place], Marzella Schlein, MD   4 months ago Type 2 diabetes mellitus with other circulatory complication, without long-term current use of insulin [facility])   [facility], Marzella Schlein, MD             \

## 2019-08-15 ENCOUNTER — Other Ambulatory Visit: Payer: Self-pay

## 2019-08-15 DIAGNOSIS — E1159 Type 2 diabetes mellitus with other circulatory complications: Secondary | ICD-10-CM

## 2019-08-15 MED ORDER — FREESTYLE TEST VI STRP: ORAL_STRIP | 5 refills | Status: DC

## 2019-08-27 ENCOUNTER — Other Ambulatory Visit: Payer: Self-pay

## 2019-08-27 ENCOUNTER — Ambulatory Visit: Payer: PPO | Admitting: Podiatry

## 2019-08-27 DIAGNOSIS — M79674 Pain in right toe(s): Secondary | ICD-10-CM

## 2019-08-27 DIAGNOSIS — M21612 Bunion of left foot: Secondary | ICD-10-CM

## 2019-08-27 DIAGNOSIS — M79675 Pain in left toe(s): Secondary | ICD-10-CM | POA: Diagnosis not present

## 2019-08-27 DIAGNOSIS — B351 Tinea unguium: Secondary | ICD-10-CM | POA: Diagnosis not present

## 2019-08-27 DIAGNOSIS — M2041 Other hammer toe(s) (acquired), right foot: Secondary | ICD-10-CM

## 2019-08-27 DIAGNOSIS — E1159 Type 2 diabetes mellitus with other circulatory complications: Secondary | ICD-10-CM

## 2019-08-27 DIAGNOSIS — M2042 Other hammer toe(s) (acquired), left foot: Secondary | ICD-10-CM

## 2019-08-27 DIAGNOSIS — M2011 Hallux valgus (acquired), right foot: Secondary | ICD-10-CM

## 2019-08-27 DIAGNOSIS — M2012 Hallux valgus (acquired), left foot: Secondary | ICD-10-CM

## 2019-08-28 ENCOUNTER — Encounter: Payer: Self-pay | Admitting: Podiatry

## 2019-08-28 ENCOUNTER — Telehealth: Payer: Self-pay

## 2019-08-28 DIAGNOSIS — M25511 Pain in right shoulder: Secondary | ICD-10-CM

## 2019-08-28 NOTE — Telephone Encounter (Signed)
Please advise 

## 2019-08-28 NOTE — Telephone Encounter (Signed)
Copied from CRM [id number]. Topic: Referral - Request for Referral >> Aug 28, 2019 10:29 AM Marylen Ponto wrote: Has patient seen PCP for this complaint? yes  *If NO, is insurance requiring patient see PCP for this issue before PCP can refer them? Referral for which specialty: Physical Therapy Preferred provider/office: Roseanne Reno fax# [phone] Reason for referral: right shoulder pain

## 2019-08-28 NOTE — Progress Notes (Addendum)
  Subjective:  Patient ID: Jessica Wheeler, female    DOB: 10-09-1940,  MRN: [id number]  Chief Complaint  Patient presents with  . Nail Problem    pt is here for a nail trim, orthotics are in as well   79 y.o. female returns for the above complaint.  Patient is here for thickened elongated dystrophic toenails that are painful in nature x10.  Patient states that it is hard to ambulate on.  She would like to have them debrided down.  She is here to pick up her diabetic shoes which were dispensed today in clinic today.  This will help offload any pressure sensitive areas.  Her bilateral submetatarsal 3 porokeratosis has completely resolved.  She states that she does not have any pain on the bottom of the foot anymore.  Objective:  There were no vitals filed for this visit. Podiatric Exam: Vascular: dorsalis pedis and posterior tibial pulses are palpable bilateral. Capillary return is immediate. Temperature gradient is WNL. Skin turgor WNL  Sensorium: Normal Semmes Weinstein monofilament test. Normal tactile sensation bilaterally. Nail Exam: Pt has thick disfigured discolored nails with subungual debris noted bilateral entire nail hallux through fifth toenails Ulcer Exam: There is no evidence of ulcer or pre-ulcerative changes or infection. Orthopedic Exam: Muscle tone and strength are WNL. No limitations in general ROM. No crepitus or effusions noted. HAV  B/L.  Hammer toes 2-5  B/L.  Bunion deformity noted to the left foot.  Reducible in nature.  Not back down but tracking deformity.  Mild pain on palpation.  No intra-articular pain of the first metatarsophalangeal joint left side. Skin: No Porokeratosis. No infection or ulcers  Assessment & Plan:  Patient was evaluated and treated and all questions answered.  Onychomycosis with pain  -Nails palliatively debrided as below. -Educated on self-care  Left bunion deformity -I explained the patient the etiology of bunion deformity and various  treatment options were extensively discussed.  Given that patient has mild pain to the area I believe she will benefit from just conservative care which shoe gear modification which were discussed with the patient.  I also explained to her that she can obtain bunion pad from over-the-counter that can give her some temporary relief.  If she does not have adequate relief we will consider the orthotics management or surgical intervention.  She states understanding  Bilateral submetatarsal 3 porokeratosis/benign skin lesion -Resolved with aggressive debridement with excision of the nucleated core.  No further therapies are indicated. -Diabetic shoes were dispensed in office today to help with distribution of pressure evenly.  Procedure: Nail Debridement Rationale: pain  Type of Debridement: manual, sharp debridement. Instrumentation: Nail nipper, rotary burr. Number of Nails: 10  Procedures and Treatment: Consent by patient was obtained for treatment procedures. The patient understood the discussion of treatment and procedures well. All questions were answered thoroughly reviewed. Debridement of mycotic and hypertrophic toenails, 1 through 5 bilateral and clearing of subungual debris. No ulceration, no infection noted.  Return Visit-Office Procedure: Patient instructed to return to the office for a follow up visit 3 months for continued evaluation and treatment.  Nicholes Rough, DPM    No follow-ups on file.

## 2019-08-28 NOTE — Telephone Encounter (Signed)
Ok to place referral. Had visit for this recently

## 2019-08-28 NOTE — Addendum Note (Signed)
Addended by: Hyacinth Meeker on: 08/28/2019 03:48 PM   Modules accepted: Orders

## 2019-09-02 ENCOUNTER — Encounter (INDEPENDENT_AMBULATORY_CARE_PROVIDER_SITE_OTHER): Payer: Self-pay

## 2019-09-04 DIAGNOSIS — M25511 Pain in right shoulder: Secondary | ICD-10-CM | POA: Diagnosis not present

## 2019-09-09 ENCOUNTER — Encounter: Payer: Self-pay | Admitting: Family Medicine

## 2019-09-09 NOTE — Telephone Encounter (Signed)
Let's try to get her on someone's schedule today for evaluation.  Doubt PT will work with her prior to this being evaluated

## 2019-09-10 ENCOUNTER — Ambulatory Visit (INDEPENDENT_AMBULATORY_CARE_PROVIDER_SITE_OTHER): Payer: PPO | Admitting: Family Medicine

## 2019-09-10 ENCOUNTER — Other Ambulatory Visit: Payer: Self-pay

## 2019-09-10 ENCOUNTER — Ambulatory Visit
Admission: RE | Admit: 2019-09-10 | Discharge: 2019-09-10 | Disposition: A | Discharge status: Home / Self Care | Payer: PPO | Source: Ambulatory Visit | Attending: Family Medicine | Admitting: Family Medicine

## 2019-09-10 ENCOUNTER — Encounter: Payer: Self-pay | Admitting: Family Medicine

## 2019-09-10 VITALS — BP 149/74 | HR 84 | Temp 96.8°F | Resp 16 | Wt 142.4 lb

## 2019-09-10 DIAGNOSIS — S3992XA Unspecified injury of lower back, initial encounter: Secondary | ICD-10-CM | POA: Insufficient documentation

## 2019-09-10 DIAGNOSIS — M542 Cervicalgia: Secondary | ICD-10-CM | POA: Diagnosis not present

## 2019-09-10 DIAGNOSIS — S12600A Unspecified displaced fracture of seventh cervical vertebra, initial encounter for closed fracture: Secondary | ICD-10-CM | POA: Diagnosis not present

## 2019-09-10 DIAGNOSIS — S299XXA Unspecified injury of thorax, initial encounter: Secondary | ICD-10-CM | POA: Diagnosis not present

## 2019-09-10 MED ORDER — MELOXICAM 15 MG PO TABS: 7.5000 mg | ORAL_TABLET | Freq: Every day | ORAL | 0 refills | Status: DC

## 2019-09-10 NOTE — Progress Notes (Signed)
See note from Daniel Le, medical student, attested by me from this date of service 

## 2019-09-10 NOTE — Progress Notes (Signed)
Established patient visit   Patient: Jessica Wheeler   DOB: 01-Nov-1940   79 y.o. Female  MRN: [id number] Visit Date: 09/10/2019  Today's healthcare provider: Shirlee Latch, MD   Chief Complaint  Patient presents with  . Back Injury   Subjective    HPI  Acute Traumatic Back Injury  Pt with an acute traumatic back injury that occurred 5 days ago Pt states that a cornice board over her window fell directly on her upper back Pt states that she has home health to regularly check on her Home health sent a paramedic to check on her Pt was told to take 3 Advil every 6 hours and ice Pt states the Advil helped Pt also tried baclofen with no relief Pain is worse when using her right arm in rreaching movements Pain is located on upper thoracic spine bilaterally Pt denies passing out during accident, headache, changes to vision, dysphagia,       ------------------------------------------------------------------------------------     Social History   Tobacco Use  . Smoking status: Former Smoker    Packs/day: 1.00    Years: 15.00    Pack years: 15.00    Types: Cigarettes    Quit date: 08/03/2003    Years since quitting: 16.1  . Smokeless tobacco: Never Used  Substance Use Topics  . Alcohol use: No  . Drug use: No   Social History   Socioeconomic History  . Marital status: Widowed    Spouse name: Not on file  . Number of children: 2  . Years of education: 8  . Highest education level: Associate degree: academic program  Occupational History  . Occupation: Retired  Tobacco Use  . Smoking status: Former Smoker    Packs/day: 1.00    Years: 15.00    Pack years: 15.00    Types: Cigarettes    Quit date: 08/03/2003    Years since quitting: 16.1  . Smokeless tobacco: Never Used  Substance and Sexual Activity  . Alcohol use: No  . Drug use: No  . Sexual activity: Not on file  Other Topics Concern  . Not on file  Social History Narrative  . Not on file    Social Determinants of Health   Financial Resource Strain: Low Risk   . Difficulty of Paying Living Expenses: Not hard at all  Food Insecurity: No Food Insecurity  . Worried About Programme researcher, broadcasting/film/video in the Last Year: Never true  . Ran Out of Food in the Last Year: Never true  Transportation Needs: No Transportation Needs  . Lack of Transportation (Medical): No  . Lack of Transportation (Non-Medical): No  Physical Activity: Sufficiently Active  . Days of Exercise per Week: 5 days  . Minutes of Exercise per Session: 40 min  Stress: No Stress Concern Present  . Feeling of Stress : Not at all  Social Connections: Moderately Isolated  . Frequency of Communication with Friends and Family: More than three times a week  . Frequency of Social Gatherings with Friends and Family: Once a week  . Attends Religious Services: Never  . Active Member of Clubs or Organizations: No  . Attends Banker Meetings: Never  . Marital Status: Widowed  Intimate Partner Violence: Not At Risk  . Fear of Current or Ex-Partner: No  . Emotionally Abused: No  . Physically Abused: No  . Sexually Abused: No       Medications: Outpatient Medications Prior to Visit  Medication Sig  .  amiloride-hydrochlorothiazide (MODURETIC) 5-50 MG tablet TAKE 1/2 TABLET BY MOUTH DAILY AS NEEDED FOR SWELLING  . amLODipine (NORVASC) 5 MG tablet Take 1 tablet by mouth once daily  . aspirin 81 MG EC tablet Take 81 mg by mouth daily.    . Baclofen 5 MG TABS Take 10 mg by mouth 3 (three) times daily as needed.  Marland Kitchen Bioflavonoid Products (ESTER C PO) Take 500 mg by mouth daily. occassionally takes 2 tablets  . Cyanocobalamin (VITAMIN B-12) 1000 MCG/15ML LIQD Take 1,000 mcg by mouth daily.   . Ibuprofen (ADVIL) 200 MG CAPS Take 1 capsule by mouth daily.    Marland Kitchen latanoprost (XALATAN) 0.005 % ophthalmic solution Reported on 10/12/2015 - both eyes nightly  . losartan (COZAAR) 100 MG tablet TAKE 1 TABLET BY MOUTH EVERY DAY  .  MAGNESIUM PO Take 250 mg by mouth daily.   . metFORMIN (GLUCOPHAGE) 500 MG tablet TAKE 2 TABLETS BY MOUTH TWICE DAILY WITH MEALS  . ezetimibe (ZETIA) 10 MG tablet Take 1 tablet (10 mg total) by mouth daily. (Patient not taking: Reported on 09/10/2019)  . [DISCONTINUED] glucose blood (FREESTYLE TEST STRIPS) test strip Check one time daily.  . [DISCONTINUED] phenazopyridine (URISTAT) 95 MG tablet Take 95 mg by mouth 3 (three) times daily as needed for pain.   No facility-administered medications prior to visit.    Review of Systems  Constitutional: Negative.   HENT: Negative.   Eyes: Negative.   Respiratory: Negative.   Cardiovascular: Negative.   Gastrointestinal: Negative.   Endocrine: Negative.   Genitourinary: Negative.   Musculoskeletal: Positive for arthralgias, back pain, myalgias, neck pain and neck stiffness. Negative for gait problem and joint swelling.  Skin: Negative.   Allergic/Immunologic: Negative.   Neurological: Negative.   Hematological: Negative.   Psychiatric/Behavioral: Negative.        Objective    BP (!) 149/74 (BP Location: Left Arm, Patient Position: Sitting, Cuff Size: Normal)   Pulse 84   Temp (!) 96.8 F (36 C) (Temporal)   Resp 16   Wt 142 lb 6.4 oz (64.6 kg)   BMI 24.83 kg/m  BP Readings from Last 3 Encounters:  09/10/19 (!) 149/74  07/17/19 139/72  07/04/19 (!) 145/72   Wt Readings from Last 3 Encounters:  09/10/19 142 lb 6.4 oz (64.6 kg)  07/17/19 143 lb 12.8 oz (65.2 kg)  07/04/19 145 lb (65.8 kg)      Physical Exam Constitutional:      Appearance: Normal appearance.  HENT:     Head: Normocephalic and atraumatic.  Eyes:     General: No visual field deficit. Cardiovascular:     Rate and Rhythm: Normal rate and regular rhythm.     Pulses: Normal pulses.     Heart sounds: Normal heart sounds.  Pulmonary:     Effort: Pulmonary effort is normal.     Breath sounds: Normal breath sounds.  Musculoskeletal:     Right shoulder:  Tenderness and bony tenderness present. Decreased strength.     Left shoulder: Tenderness and bony tenderness present. Decreased strength.     Right upper arm: No tenderness or bony tenderness.     Left upper arm: No tenderness or bony tenderness.     Cervical back: Signs of trauma, tenderness and bony tenderness present. Pain with movement, spinous process tenderness and muscular tenderness present. Decreased range of motion.     Thoracic back: Swelling, signs of trauma, spasms, tenderness and bony tenderness present. Decreased range of motion.  Lumbar back: Normal.     Comments: Shoulder strength is limited by pain bilaterally  Skin:    General: Skin is warm and dry.     Findings: Bruising and signs of injury present. No erythema, laceration or wound.     Comments: Bruising is located on the upper back across the shoulder bilaterally  Neurological:     Mental Status: She is alert and oriented to person, place, and time.     Cranial Nerves: Cranial nerves are intact. No cranial nerve deficit, dysarthria or facial asymmetry.     Sensory: Sensation is intact.     Motor: Motor function is intact. No tremor or abnormal muscle tone.     Coordination: Coordination is intact. Coordination normal. Finger-Nose-Finger Test normal.     Deep Tendon Reflexes:     Reflex Scores:      Tricep reflexes are 2+ on the right side and 2+ on the left side.      Bicep reflexes are 2+ on the right side and 2+ on the left side.    Comments: Strength testing is limited by pain, but motor function is grossly intact      No results found for any visits on 09/10/19.  Assessment & Plan     1. Traumatic injury of back Pt with an acute traumatic back injury due to cornice board falling on her back Point tenderness over lower c-spine and upper t-spine Pt with limited ROM and strength limited by pain Pt has normal neurological function in upper extremities  Plan: Will start meloxicam for pain Will get  cervical spine and thoracic spine x-ray to evaluate for fracture    Return if symptoms worsen or fail to improve.      Chipper Herb, Medical Student [facility] [place] [phone] (phone) [phone] (fax)  [place]

## 2019-09-11 ENCOUNTER — Other Ambulatory Visit: Payer: Self-pay | Admitting: Specialist

## 2019-09-11 ENCOUNTER — Ambulatory Visit
Admission: RE | Admit: 2019-09-11 | Discharge: 2019-09-11 | Disposition: A | Discharge status: Home / Self Care | Payer: PPO | Source: Ambulatory Visit | Attending: Specialist | Admitting: Specialist

## 2019-09-11 DIAGNOSIS — E119 Type 2 diabetes mellitus without complications: Secondary | ICD-10-CM | POA: Diagnosis not present

## 2019-09-11 DIAGNOSIS — M542 Cervicalgia: Secondary | ICD-10-CM

## 2019-09-11 DIAGNOSIS — M419 Scoliosis, unspecified: Secondary | ICD-10-CM | POA: Diagnosis not present

## 2019-09-11 DIAGNOSIS — S12600A Unspecified displaced fracture of seventh cervical vertebra, initial encounter for closed fracture: Secondary | ICD-10-CM | POA: Diagnosis not present

## 2019-09-11 DIAGNOSIS — M546 Pain in thoracic spine: Secondary | ICD-10-CM | POA: Diagnosis not present

## 2019-09-13 ENCOUNTER — Telehealth: Payer: Self-pay | Admitting: Family Medicine

## 2019-09-13 NOTE — Chronic Care Management (AMB) (Signed)
  Chronic Care Management   Note  09/13/2019 Name: Jessica Wheeler MRN: [id number] DOB: 02-08-1941  [name] is a 79 y.o. year old female who is a primary care patient of [name], [name], MD. I reached out to [name] by phone today in response to a referral sent by Ms. Tempie Hoist Droll's health plan.     [name] was given information about Chronic Care Management services today including:  1. CCM service includes personalized support from designated clinical staff supervised by her physician, including individualized plan of care and coordination with other care providers 2. 24/7 contact phone numbers for assistance for urgent and routine care needs. 3. Service will only be billed when office clinical staff spend 20 minutes or more in a month to coordinate care. 4. Only one practitioner may furnish and bill the service in a calendar month. 5. The patient may stop CCM services at any time (effective at the end of the month) by phone call to the office staff. 6. The patient will be responsible for cost sharing (co-pay) of up to 20% of the service fee (after annual deductible is met).  Patient agreed to services and verbal consent obtained.   Follow up plan: Telephone appointment with care management team member scheduled for: 09/17/2019  [place] [facility]  [place],  [id number] Direct Dial: [email or web address] Website: [email or web address]

## 2019-09-17 ENCOUNTER — Ambulatory Visit: Payer: Self-pay

## 2019-09-17 ENCOUNTER — Telehealth: Payer: PPO

## 2019-09-17 NOTE — Chronic Care Management (AMB) (Signed)
  Chronic Care Management   Outreach Note  09/17/2019 Name: APPLE DEARMAS MRN: [id number] DOB: Jul 11, 1940  Primary Care Provider: Erasmo Downer, MD Reason for referral : Chronic Care Management   An unsuccessful telephone outreach was attempted today. Ms. Mcnicholas was referred to the case management team for assistance with care management and care coordination.   A HIPAA compliant voice message was left requesting a return call.   Follow Up Plan:  The care management team will reach out to Ms. Bodner again within the next two weeks.    Katha Cabal [facility] [phone]

## 2019-09-18 DIAGNOSIS — M542 Cervicalgia: Secondary | ICD-10-CM | POA: Diagnosis not present

## 2019-09-21 ENCOUNTER — Ambulatory Visit
Admit: 2019-09-21 | Discharge: 2019-09-21 | Disposition: A | Discharge status: Home / Self Care | Payer: PPO | Attending: Family Medicine | Admitting: Family Medicine

## 2019-09-21 ENCOUNTER — Ambulatory Visit
Admission: EM | Admit: 2019-09-21 | Discharge: 2019-09-21 | Disposition: A | Discharge status: Home / Self Care | Payer: PPO | Attending: Family Medicine | Admitting: Family Medicine

## 2019-09-21 ENCOUNTER — Other Ambulatory Visit: Payer: Self-pay

## 2019-09-21 DIAGNOSIS — M7989 Other specified soft tissue disorders: Secondary | ICD-10-CM | POA: Diagnosis not present

## 2019-09-21 DIAGNOSIS — R0602 Shortness of breath: Secondary | ICD-10-CM | POA: Diagnosis not present

## 2019-09-21 LAB — BRAIN NATRIURETIC PEPTIDE: B Natriuretic Peptide: 29.4 pg/mL (ref 0.0–100.0)

## 2019-09-21 LAB — BASIC METABOLIC PANEL
Anion gap: 12 (ref 5–15)
BUN: 12 mg/dL (ref 8–23)
CO2: 24 mmol/L (ref 22–32)
Calcium: 9.4 mg/dL (ref 8.9–10.3)
Chloride: 88 mmol/L — ABNORMAL LOW (ref 98–111)
Creatinine, Ser: 0.57 mg/dL (ref 0.44–1.00)
GFR calc Af Amer: >60 mL/min (ref >60)
GFR calc non Af Amer: >60 mL/min (ref >60)
Glucose, Bld: 156 mg/dL — ABNORMAL HIGH (ref 70–99)
Potassium: 3.7 mmol/L (ref 3.5–5.1)
Sodium: 124 mmol/L — ABNORMAL LOW (ref 135–145)

## 2019-09-21 LAB — URINALYSIS, COMPLETE (UACMP) WITH MICROSCOPIC
Bilirubin Urine: NEGATIVE
Glucose, UA: NEGATIVE mg/dL
Hgb urine dipstick: NEGATIVE
Ketones, ur: 15 mg/dL — AB
Nitrite: NEGATIVE
Protein, ur: NEGATIVE mg/dL
Specific Gravity, Urine: 1.025 (ref 1.005–1.030)
pH: 6 (ref 5.0–8.0)

## 2019-09-21 NOTE — Discharge Instructions (Signed)
Stop amiloride/HCTZ.  Elevated legs.  I will call with [place] results.   Take care  Dr. Adriana Simas

## 2019-09-21 NOTE — ED Triage Notes (Signed)
Patient states that she has been having bilateral foot swelling last night. Reports that she talked to a nurse at [facility] and was told to come here for possible Lasix. Reports that she was told her kidney function was low. 2 weeks ago a board fell on her neck and broke C-7, patient currently in an [employer].

## 2019-09-22 NOTE — ED Provider Notes (Signed)
[facility]    CSN: [id number] Arrival date & time: 09/21/19  1007      History   Chief Complaint Chief Complaint  Patient presents with   Foot Swelling   HPI 79 year old female presents with the above complaint.   Reports bilateral feet swelling. Started last night. Patient states that she has been less active due to recent C spine injury. She states that she was informed to come in for evaluation due to symptoms.  Mild SOB. Feels like she is urinating less frequently. No fever. No recent fall/trauma/injury. No relieving factors. No other associated symptoms.    Past Medical History:  Diagnosis Date   Allergy    Arthritis    Bundle branch block, right    Diabetes mellitus    Diffuse cystic mastopathy    Dissection, aorta (HCC)    Dr.Dew follows   Diverticulosis    Family history of malignant neoplasm of gastrointestinal tract    Glaucoma 2005   Heart murmur    History of bronchitis    History of pancreatitis 2005   Hypertension 1982   Personal history of tobacco use, presenting hazards to health    RBBB    Ulcer     Patient Active Problem List   Diagnosis Date Noted   Hyperlipidemia associated with type 2 diabetes mellitus (HCC) 03/15/2019   Fatigue 12/11/2017   Lower extremity edema 06/29/2017   Primary osteoarthritis of left knee 03/10/2016   Depression 12/28/2015   Allergic rhinitis 01/31/2015   Benign paroxysmal positional nystagmus 01/31/2015   Bladder infection, chronic 01/31/2015   Glaucoma 01/31/2015   Alopecia 01/31/2015   Chronic hyponatremia 01/31/2015   Muscle spasm 01/31/2015   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 No hardware, no discectomy, no fusion   BLADDER SURGERY     BREAST BIOPSY Right 1980's   benign   BREAST EXCISIONAL BIOPSY Right 1980s   benign   BREAST SURGERY     biopsy   CARDIAC CATHETERIZATION     Dr. Lady Gary, few years ago   CATARACT EXTRACTION W/PHACO Left 08/31/2015   Procedure: CATARACT EXTRACTION PHACO AND INTRAOCULAR LENS PLACEMENT (IOC);  Surgeon: Sherald Hess, MD;  Location: [facility];  Service: Ophthalmology;  Laterality: Left;  DIABETIC - oral meds   CATARACT EXTRACTION W/PHACO Right 10/12/2015   Procedure: CATARACT EXTRACTION PHACO AND INTRAOCULAR LENS PLACEMENT (IOC) right eye;  Surgeon: Sherald Hess, MD;  Location: [facility];  Service: Ophthalmology;  Laterality: Right;  DIABETIC - oral meds   CHOLECYSTECTOMY  1994   COLONOSCOPY  2008,02/22/2012   Dr. Sankar-2013   COLONOSCOPY WITH PROPOFOL N/A 04/04/2017   Procedure: COLONOSCOPY WITH PROPOFOL;  Surgeon: [name], MD;  Location: [facility];  Service: Endoscopy;  Laterality: N/A;   REFRACTIVE SURGERY      OB History    Gravida  2   Para      Term  Preterm      AB      Living  2     SAB      TAB      Ectopic      Multiple      Live Births           Obstetric Comments  Age with first menstruation-? Age with first pregnancy-18 hysterectomy         Home Medications    Prior to Admission medications   Medication Sig Start Date End Date Taking? Authorizing Provider  amiloride-hydrochlorothiazide (MODURETIC) 5-50 MG tablet TAKE 1/2 TABLET BY MOUTH DAILY AS NEEDED FOR SWELLING 09/17/18  Yes Bacigalupo, [name], MD  amLODipine (NORVASC) 5 MG tablet Take 1 tablet by mouth once daily  08/05/19  Yes Bacigalupo, [name], MD  aspirin 81 MG EC tablet Take 81 mg by mouth daily.     Yes [provider]  Bioflavonoid Products (ESTER C PO) Take 500 mg by mouth daily. occassionally takes 2 tablets   Yes [provider]  Calcium-Magnesium-Vitamin D (CALCIUM 1200+D3 PO) Take by mouth.   Yes [provider]  Cyanocobalamin (VITAMIN B-12) 1000 MCG/15ML LIQD Take 1,000 mcg by mouth daily.    Yes [provider]  ezetimibe (ZETIA) 10 MG tablet Take 1 tablet (10 mg total) by mouth daily. 06/03/19  Yes Minna Merritts, MD  Ibuprofen (ADVIL) 200 MG CAPS Take 1 capsule by mouth daily.     Yes [provider]  latanoprost (XALATAN) 0.005 % ophthalmic solution Reported on 10/12/2015 - both eyes nightly 04/09/15  Yes [provider]  losartan (COZAAR) 100 MG tablet TAKE 1 TABLET BY MOUTH EVERY DAY 09/17/18  Yes Bacigalupo, [name], MD  MAGNESIUM PO Take 250 mg by mouth daily.    Yes [provider]  meloxicam (MOBIC) 15 MG tablet Take 0.5-1 tablets (7.5-15 mg total) by mouth daily. 09/10/19  Yes Bacigalupo, [name], MD  metFORMIN (GLUCOPHAGE) 500 MG tablet TAKE 2 TABLETS BY MOUTH TWICE DAILY WITH MEALS 08/05/19  Yes Bacigalupo, [name], MD    Family History Family History  Problem Relation Age of Onset   Diabetes Mother    Hypertension Mother    Anxiety disorder Mother    Heart disease Father        rheumatic heart disease   Heart attack Father    Rheum arthritis Father    Colon cancer Brother    Hypertension Maternal Grandmother    Hypertension Maternal Grandfather    Breast cancer Maternal Aunt 60   Breast cancer Cousin 50    Social History Social History   Tobacco Use   Smoking status: Former Smoker    Packs/day: 1.00    Years: 15.00    Pack years: 15.00    Types: Cigarettes    Quit date: 08/03/2003    Years since quitting: 16.1   Smokeless tobacco: Never Used  Substance Use Topics   Alcohol use: No     Drug use: No     Allergies   Beta adrenergic blockers, Jardiance [empagliflozin], Levaquin [levofloxacin in d5w], Levofloxacin, Lisinopril, Prevnar [pneumococcal 13-val conj vacc], and Sulfa antibiotics   Review of Systems Review of Systems  Constitutional: Negative for fever.  Cardiovascular: Positive for leg swelling.  Genitourinary:       Urinating less frequently.    Physical Exam Triage Vital Signs ED Triage Vitals  Enc Vitals Group     BP 09/21/19 1017 (!) 173/81  Pulse Rate 09/21/19 1017 94     Resp 09/21/19 1017 18     Temp 09/21/19 1017 98.2 F (36.8 C)     Temp Source 09/21/19 1017 Oral     SpO2 09/21/19 1017 96 %     Weight 09/21/19 1016 142 lb (64.4 kg)     Height 09/21/19 1016 5\' 3"  (1.6 m)     Head Circumference --      Peak Flow --      Pain Score 09/21/19 1015 3     Pain Loc --      Pain Edu? --      Excl. in GC? --    Updated Vital Signs BP (!) 173/81 (BP Location: Left Arm)    Pulse 94    Temp 98.2 F (36.8 C) (Oral)    Resp 18    Ht 5\' 3"  (1.6 m)    Wt 64.4 kg    SpO2 96%    BMI 25.15 kg/m   Visual Acuity Right Eye Distance:   Left Eye Distance:   Bilateral Distance:    Right Eye Near:   Left Eye Near:    Bilateral Near:     Physical Exam Constitutional:      General: She is not in acute distress.    Appearance: Normal appearance. She is not ill-appearing.     Comments: In C collar.   HENT:     Head: Normocephalic and atraumatic.  Eyes:     General:        Right eye: No discharge.        Left eye: No discharge.     Conjunctiva/sclera: Conjunctivae normal.  Cardiovascular:     Rate and Rhythm: Normal rate and regular rhythm.     Heart sounds: No murmur.  Pulmonary:     Effort: Pulmonary effort is normal.     Breath sounds: Normal breath sounds. No wheezing or rales.  Musculoskeletal:     Comments: Bilateral pedal edema. Left calf ~ 2 cm larger than the right.  Neurological:     Mental Status: She is alert.    UC  Treatments / Results  Labs (all labs ordered are listed, but only abnormal results are displayed) Labs Reviewed  BASIC METABOLIC PANEL - Abnormal; Notable for the following components:      Result Value   Sodium 124 (*)    Chloride 88 (*)    Glucose, Bld 156 (*)    All other components within normal limits  URINALYSIS, COMPLETE (UACMP) WITH MICROSCOPIC - Abnormal; Notable for the following components:   Ketones, ur 15 (*)    Leukocytes,Ua SMALL (*)    Non Squamous Epithelial PRESENT (*)    Bacteria, UA FEW (*)    All other components within normal limits  BRAIN NATRIURETIC PEPTIDE    EKG   Radiology 09/23/19 Venous Img Lower Unilateral Left (DVT)  Result Date: 09/21/2019 CLINICAL DATA:  Leg swelling EXAM: RIGHT LOWER EXTREMITY VENOUS DOPPLER ULTRASOUND TECHNIQUE: Gray-scale sonography with graded compression, as well as color Doppler and duplex ultrasound were performed to evaluate the lower extremity deep venous systems from the level of the common femoral vein and including the common femoral, femoral, profunda femoral, popliteal and calf veins including the posterior tibial, peroneal and gastrocnemius veins when visible. The superficial great saphenous vein was also interrogated. Spectral Doppler was utilized to evaluate flow at rest and with distal augmentation maneuvers in the common femoral, femoral and popliteal veins. COMPARISON:  None.  FINDINGS: Contralateral Common Femoral Vein: Respiratory phasicity is normal and symmetric with the symptomatic side. No evidence of thrombus. Normal compressibility. Common Femoral Vein: No evidence of thrombus. Normal compressibility, respiratory phasicity and response to augmentation. Saphenofemoral Junction: No evidence of thrombus. Normal compressibility and flow on color Doppler imaging. Profunda Femoral Vein: No evidence of thrombus. Normal compressibility and flow on color Doppler imaging. Femoral Vein: No evidence of thrombus. Normal  compressibility, respiratory phasicity and response to augmentation. Popliteal Vein: No evidence of thrombus. Normal compressibility, respiratory phasicity and response to augmentation. Calf Veins: No evidence of thrombus. Normal compressibility and flow on color Doppler imaging. Superficial Great Saphenous Vein: No evidence of thrombus. Normal compressibility. Venous Reflux:  None. Other Findings:  None. IMPRESSION: No evidence of deep venous thrombosis. Electronically Signed   By: Alcide Clever M.D.   On: 09/21/2019 14:53    Procedures Procedures (including critical care time)  Medications Ordered in UC Medications - No data to display  Initial Impression / Assessment and Plan / UC Course  I have reviewed the triage vital signs and the nursing notes.  Pertinent labs & imaging results that were available during my care of the patient were reviewed by me and considered in my medical decision making (see chart for details).    79 year old female presents with lower leg swelling (left > right). Labs notable for hyponatremia (124). Stopping Amiloride/HCTZ. BNP normal. US obtained to rule out DVT. [place] was negative. Advised elevation of legs and supportive care.  Final Clinical Impressions(s) / UC Diagnoses   Final diagnoses:  Leg swelling     Discharge Instructions     Stop amiloride/HCTZ.  Elevated legs.  I will call with [place] results.   Take care  Dr. Adriana Simas    ED Prescriptions    None     PDMP not reviewed this encounter.   Tommie Sams, [place] 09/22/19 [phone]

## 2019-09-25 DIAGNOSIS — M542 Cervicalgia: Secondary | ICD-10-CM | POA: Diagnosis not present

## 2019-10-03 ENCOUNTER — Telehealth: Payer: PPO

## 2019-10-09 DIAGNOSIS — M542 Cervicalgia: Secondary | ICD-10-CM | POA: Diagnosis not present

## 2019-10-10 ENCOUNTER — Ambulatory Visit (INDEPENDENT_AMBULATORY_CARE_PROVIDER_SITE_OTHER): Payer: PPO

## 2019-10-10 DIAGNOSIS — E1169 Type 2 diabetes mellitus with other specified complication: Secondary | ICD-10-CM

## 2019-10-10 DIAGNOSIS — I10 Essential (primary) hypertension: Secondary | ICD-10-CM

## 2019-10-10 NOTE — Chronic Care Management (AMB) (Signed)
Chronic Care Management   Initial Visit Note  10/10/2019 Name: Jessica Wheeler MRN: [id number] DOB: 06/01/1940  Primary Care Provider: Bacigalupo, Angela M, MD Reason for referral : Chronic Care Management   Jessica Wheeler is a 78 y.o. year old female who is a primary care patient of Bacigalupo, Angela M, MD. The CCM team was consulted for assistance with chronic disease management and care coordination. A telephonic assessment was conducted today.  Review of Jessica Wheeler status, including review of consultants reports, relevant labs and test results was conducted today. Collaboration with appropriate care team members was performed as part of the comprehensive evaluation and provision of chronic care management services.    SDOH (Social Determinants of Health) assessments performed: Yes No further interventions required at this time.    Medications: Outpatient Encounter Medications as of 10/10/2019  Medication Sig  . amiloride-hydrochlorothiazide (MODURETIC) 5-50 MG tablet TAKE 1/2 TABLET BY MOUTH DAILY AS NEEDED FOR SWELLING  . amLODipine (NORVASC) 5 MG tablet Take 1 tablet by mouth once daily  . aspirin 81 MG EC tablet Take 81 mg by mouth daily.    . Bioflavonoid Products (ESTER C PO) Take 500 mg by mouth daily. occassionally takes 2 tablets  . Calcium-Magnesium-Vitamin D (CALCIUM 1200+D3 PO) Take by mouth.  . Cyanocobalamin (VITAMIN B-12) 1000 MCG/15ML LIQD Take 1,000 mcg by mouth daily.   . ezetimibe (ZETIA) 10 MG tablet Take 1 tablet (10 mg total) by mouth daily.  . Ibuprofen (ADVIL) 200 MG CAPS Take 1 capsule by mouth daily.    . latanoprost (XALATAN) 0.005 % ophthalmic solution Reported on 10/12/2015 - both eyes nightly  . losartan (COZAAR) 100 MG tablet TAKE 1 TABLET BY MOUTH EVERY DAY  . MAGNESIUM PO Take 250 mg by mouth daily.   . meloxicam (MOBIC) 15 MG tablet Take 0.5-1 tablets (7.5-15 mg total) by mouth daily.  . metFORMIN (GLUCOPHAGE) 500 MG tablet TAKE 2 TABLETS BY MOUTH  TWICE DAILY WITH MEALS   No facility-administered encounter medications on file as of 10/10/2019.     Objective:  BP Readings from Last 3 Encounters:  09/21/19 (!) 173/81  09/10/19 (!) 149/74  07/17/19 139/72    Lab Results  Component Value Date   HGBA1C 7.0 (H) 07/04/2019   Lab Results  Component Value Date   CHOL 186 03/15/2019   HDL 89 03/15/2019   LDLCALC 81 03/15/2019   TRIG 88 03/15/2019   CHOLHDL 2.1 03/15/2019    Goals Addressed            This Visit's Progress   . Chronic Disease Management       CARE PLAN ENTRY (see longitudinal plan of care for additional care plan information)  Current Barriers:  . Chronic Disease Management support and education needs related to HTN, CAD, DM and Hyperlipidemia. (Recent C-Spine Injury)  Case Manager Clinical Goal(s):  . Over the next 120 days, patient will not require hospitalization or emergent care d/t complications r/t chronic illnesses. . Over the next 90 days, patient will take all medications as prescribed. . Over the next 90 days, patient will attend all appointments as scheduled. . Over the next 90 days, patient will monitor blood pressure and record readings. . Over the next 90 days, patient will adhere to recommended cardiac prudent/diabetic diet. . Over the next 90 days, patient will follow recommended safety measures to prevent falls and injuries. . Over the next 60 days, patient will adhere to treatment plan outlined by the Orthopedic surgical   team. . Over the next 30 days, patient will update care management team if in-home assistance is needed.   Interventions:  . Inter-disciplinary care team collaboration (see longitudinal plan of care) . Reviewed medications and indications for use. Encouraged to take all medications as prescribed and inform provider if unable to tolerate prescribed regimen. Encouraged to notify care management team with concerns regarding medication management or prescription costs.  Reports self administering medications. No current concerns regarding prescription costs. . Discussed established blood pressure parameters and indications for notifying MD. Encouraged to monitor daily and record readings. . Discussed nutritional intake and compliance with recommended cardiac prudent/diabetic diet. Encouraged to notify team if meal assistance is needed. . Discussed home safety measures and precautions to prevent falls. Encouraged to use assistive devices as needed when ambulating and adhere to plan outlined by Orthopedic providers. Very knowledgeable of fall prevention strategies. Reports wearing the [place] brace and adhering to activity restrictions as instructed by Dr. Beane. . Reviewed scheduled/pending appointments. Encouraged to attend medical appointments as scheduled to prevent delays in care. Encouraged to notify care management team with concerns regarding transportation. Reports son and daughter-in -law are currently available to provide transportation. . Discussed plans for ongoing care management follow up. Provided direct contact information for care management team. Discussed limitations d/t recent  C-Spine injury. Strongly encouraged to notify care management team if additional in-home assistance is needed. Reports arranging services with a cleaning agency to assist with light housework while she is recovering. Pending visit with a nursing assistant later today.  Patient Self Care Activities:  . Self administers medications  . Attends scheduled provider appointments . Calls pharmacy for medication refills . Performs ADL's independently   Initial goal documentation        Jessica Wheeler was given information about Chronic Care Management services  including:  1. CCM service includes personalized support from designated clinical staff supervised by her physician, including individualized plan of care and coordination with other care providers 2. 24/7 contact phone  numbers for assistance for urgent and routine care needs. 3. Service will only be billed when office clinical staff spend 20 minutes or more in a month to coordinate care. 4. Only one practitioner may furnish and bill the service in a calendar month. 5. The patient may stop CCM services at any time (effective at the end of the month) by phone call to the office staff. 6. The patient will be responsible for cost sharing (co-pay) of up to 20% of the service fee (after annual deductible is met).  Patient agreed to services and verbal consent obtained.     PLAN The care management team will follow-up within the next month.   Felecia McCray,RN  Family Practice/[facility] [phone]      

## 2019-10-15 DIAGNOSIS — M542 Cervicalgia: Secondary | ICD-10-CM | POA: Insufficient documentation

## 2019-10-16 NOTE — Patient Instructions (Addendum)
Thank you for allowing the Chronic Care Management team to participate in your care.   Goals Addressed            This Visit's Progress   . Chronic Disease Management       CARE PLAN ENTRY (see longitudinal plan of care for additional care plan information)  Current Barriers:  . Chronic Disease Management support and education needs related to HTN, CAD, DM and Hyperlipidemia. (Recent C-Spine Injury)  Case Manager Clinical Goal(s):  Marland Kitchen Over the next 120 days, patient will not require hospitalization or emergent care d/t complications r/t chronic illnesses. . Over the next 90 days, patient will take all medications as prescribed. . Over the next 90 days, patient will attend all appointments as scheduled. . Over the next 90 days, patient will monitor blood pressure and record readings. . Over the next 90 days, patient will adhere to recommended cardiac prudent/diabetic diet. . Over the next 90 days, patient will follow recommended safety measures to prevent falls and injuries. . Over the next 60 days, patient will adhere to treatment plan outlined by the Orthopedic surgical team. . Over the next 30 days, patient will update care management team if in-home assistance is needed.   Interventions:  . Inter-disciplinary care team collaboration (see longitudinal plan of care) . Reviewed medications and indications for use. Encouraged to take all medications as prescribed and inform provider if unable to tolerate prescribed regimen. Encouraged to notify care management team with concerns regarding medication management or prescription costs. Reports self administering medications. No current concerns regarding prescription costs. . Discussed established blood pressure parameters and indications for notifying MD. Encouraged to monitor daily and record readings. . Discussed nutritional intake and compliance with recommended cardiac prudent/diabetic diet. Encouraged to notify team if meal assistance  is needed. . Discussed home safety measures and precautions to prevent falls. Encouraged to use assistive devices as needed when ambulating and adhere to plan outlined by Orthopedic providers. Very knowledgeable of fall prevention strategies. Reports wearing the [place] and adhering to activity restrictions as instructed by Dr. Tonita Cong. . Reviewed scheduled/pending appointments. Encouraged to attend medical appointments as scheduled to prevent delays in care. Encouraged to notify care management team with concerns regarding transportation. Reports son and daughter-in -law are currently available to provide transportation. . Discussed plans for ongoing care management follow up. Provided direct contact information for care management team. Discussed limitations d/t recent  C-Spine injury. Strongly encouraged to notify care management team if additional in-home assistance is needed. Reports arranging services with a cleaning agency to assist with light housework while she is recovering. Pending visit with a nursing assistant later today.  Patient Self Care Activities:  . Self administers medications  . Attends scheduled provider appointments . Calls pharmacy for medication refills . Performs ADL's independently   Initial goal documentation         Ms. Lobo was given information about Chronic Care Management services  including:  1. CCM service includes personalized support from designated clinical staff supervised by her physician, including individualized plan of care and coordination with other care providers 2. 24/7 contact phone numbers for assistance for urgent and routine care needs. 3. Service will only be billed when office clinical staff spend 20 minutes or more in a month to coordinate care. 4. Only one practitioner may furnish and bill the service in a calendar month. 5. The patient may stop CCM services at any time (effective at the end of the month) by  phone call to the  office staff. 6. The patient will be responsible for cost sharing (co-pay) of up to 20% of the service fee (after annual deductible is met).  Patient agreed to services and verbal consent obtained.   Ms. Trampe verbalized understanding of the instructions provided during the telephonic outreach today. Declined need for a mailed/printed copy of the instructions.   The care management team will follow-up within the next month.    [facility] Care Management [phone]

## 2019-10-17 ENCOUNTER — Ambulatory Visit: Payer: Self-pay

## 2019-10-17 NOTE — Chronic Care Management (AMB) (Signed)
  Chronic Care Management   Note  10/17/2019 Name: Jessica Wheeler MRN: [id number] DOB: December 16, 1940   Care Coordination Only: Outreach with [facility]. Staff corrected [name]'s information to reflect Dr. Beryle Flock as the current primary care provider. Will forward treatment plans.    PLAN A member of the care management team will follow-up with [name] next month as scheduled.    Katha Cabal [facility] Care Management [phone]

## 2019-10-22 DIAGNOSIS — M542 Cervicalgia: Secondary | ICD-10-CM | POA: Diagnosis not present

## 2019-10-24 ENCOUNTER — Other Ambulatory Visit: Payer: Self-pay | Admitting: Family Medicine

## 2019-10-24 DIAGNOSIS — I10 Essential (primary) hypertension: Secondary | ICD-10-CM

## 2019-11-14 ENCOUNTER — Other Ambulatory Visit: Payer: Self-pay | Admitting: Family Medicine

## 2019-11-14 DIAGNOSIS — E1169 Type 2 diabetes mellitus with other specified complication: Secondary | ICD-10-CM

## 2019-11-15 DIAGNOSIS — Z7984 Long term (current) use of oral hypoglycemic drugs: Secondary | ICD-10-CM | POA: Diagnosis not present

## 2019-11-15 DIAGNOSIS — H401131 Primary open-angle glaucoma, bilateral, mild stage: Secondary | ICD-10-CM | POA: Diagnosis not present

## 2019-11-15 DIAGNOSIS — H43813 Vitreous degeneration, bilateral: Secondary | ICD-10-CM | POA: Diagnosis not present

## 2019-11-15 DIAGNOSIS — Z961 Presence of intraocular lens: Secondary | ICD-10-CM | POA: Diagnosis not present

## 2019-11-15 DIAGNOSIS — E119 Type 2 diabetes mellitus without complications: Secondary | ICD-10-CM | POA: Diagnosis not present

## 2019-11-19 ENCOUNTER — Ambulatory Visit: Payer: PPO

## 2019-11-19 DIAGNOSIS — M542 Cervicalgia: Secondary | ICD-10-CM | POA: Diagnosis not present

## 2019-11-19 DIAGNOSIS — E119 Type 2 diabetes mellitus without complications: Secondary | ICD-10-CM | POA: Diagnosis not present

## 2019-11-19 DIAGNOSIS — M4802 Spinal stenosis, cervical region: Secondary | ICD-10-CM | POA: Diagnosis not present

## 2019-11-19 DIAGNOSIS — M4310 Spondylolisthesis, site unspecified: Secondary | ICD-10-CM | POA: Diagnosis not present

## 2019-11-20 NOTE — Chronic Care Management (AMB) (Signed)
  Chronic Care Management   Note   Name: Jessica Wheeler MRN: [id number] DOB: [birth year]   Brief outreach with [name]. She was preparing for an appointment with Dr. Shelle Iron at the time of the call. Agreeable to outreach within the next week.   Follow up plan: The care management team will follow-up within the next week.     Katha Cabal [facility] Care Management [phone]

## 2019-11-26 ENCOUNTER — Telehealth: Payer: Self-pay

## 2019-11-26 ENCOUNTER — Ambulatory Visit: Payer: PPO | Admitting: Podiatry

## 2019-11-26 ENCOUNTER — Telehealth: Payer: PPO

## 2019-11-26 NOTE — Telephone Encounter (Signed)
  Chronic Care Management   Outreach Note  11/26/2019 Name: Jessica Wheeler MRN: [id number] DOB: 04-Mar-1941  Primary Care Provider: Erasmo Downer, MD Reason for referral : Chronic Care Management   An unsuccessful telephone outreach was attempted today. [name] is currently engaged with the chronic care management team.   Follow Up Plan: The care management team will reach out to [name] again within the next two weeks.   Katha Cabal [facility] Care Management [phone]

## 2019-12-10 ENCOUNTER — Other Ambulatory Visit: Payer: Self-pay

## 2019-12-10 ENCOUNTER — Ambulatory Visit: Payer: Self-pay | Admitting: *Deleted

## 2019-12-10 ENCOUNTER — Emergency Department: Payer: PPO

## 2019-12-10 ENCOUNTER — Telehealth: Payer: Self-pay

## 2019-12-10 ENCOUNTER — Encounter: Payer: Self-pay | Admitting: Intensive Care

## 2019-12-10 ENCOUNTER — Emergency Department
Admission: EM | Admit: 2019-12-10 | Discharge: 2019-12-10 | Disposition: A | Discharge status: Home / Self Care | Payer: PPO | Attending: Emergency Medicine | Admitting: Emergency Medicine

## 2019-12-10 ENCOUNTER — Telehealth: Payer: Self-pay | Admitting: Cardiovascular Disease

## 2019-12-10 ENCOUNTER — Telehealth: Payer: PPO

## 2019-12-10 DIAGNOSIS — R110 Nausea: Secondary | ICD-10-CM | POA: Diagnosis not present

## 2019-12-10 DIAGNOSIS — Z5321 Procedure and treatment not carried out due to patient leaving prior to being seen by health care provider: Secondary | ICD-10-CM | POA: Diagnosis not present

## 2019-12-10 DIAGNOSIS — R079 Chest pain, unspecified: Secondary | ICD-10-CM | POA: Insufficient documentation

## 2019-12-10 DIAGNOSIS — R531 Weakness: Secondary | ICD-10-CM | POA: Insufficient documentation

## 2019-12-10 DIAGNOSIS — R21 Rash and other nonspecific skin eruption: Secondary | ICD-10-CM | POA: Diagnosis not present

## 2019-12-10 LAB — BASIC METABOLIC PANEL
Anion gap: 13 (ref 5–15)
BUN: 17 mg/dL (ref 8–23)
CO2: 23 mmol/L (ref 22–32)
Calcium: 9.6 mg/dL (ref 8.9–10.3)
Chloride: 96 mmol/L — ABNORMAL LOW (ref 98–111)
Creatinine, Ser: 0.82 mg/dL (ref 0.44–1.00)
GFR calc Af Amer: >60 mL/min (ref >60)
GFR calc non Af Amer: >60 mL/min (ref >60)
Glucose, Bld: 129 mg/dL — ABNORMAL HIGH (ref 70–99)
Potassium: 3.7 mmol/L (ref 3.5–5.1)
Sodium: 132 mmol/L — ABNORMAL LOW (ref 135–145)

## 2019-12-10 LAB — TROPONIN I (HIGH SENSITIVITY)
Troponin I (High Sensitivity): 4 ng/L (ref <18)
Troponin I (High Sensitivity): 5 ng/L (ref <18)

## 2019-12-10 LAB — CBC
HCT: 37 % (ref 36.0–46.0)
Hemoglobin: 13.1 g/dL (ref 12.0–15.0)
MCH: 32 pg (ref 26.0–34.0)
MCHC: 35.4 g/dL (ref 30.0–36.0)
MCV: 90.2 fL (ref 80.0–100.0)
Platelets: 251 10*3/uL (ref 150–400)
RBC: 4.1 MIL/uL (ref 3.87–5.11)
RDW: 13.6 % (ref 11.5–15.5)
WBC: 9 10*3/uL (ref 4.0–10.5)
nRBC: 0 % (ref 0.0–0.2)

## 2019-12-10 LAB — GLUCOSE, CAPILLARY: Glucose-Capillary: 105 mg/dL — ABNORMAL HIGH (ref 70–99)

## 2019-12-10 NOTE — Telephone Encounter (Signed)
Patient calling Last night patient had a severe pressure in middle of chest  Patient became really sweaty and weak - especially in legs Patient would like to discuss with nurse - would like to avoid going to ED

## 2019-12-10 NOTE — ED Triage Notes (Signed)
Patient c/o sharp chest tightness with nausea. C/o weakness in legs. Denies pain at this moment. 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

## 2019-12-10 NOTE — Telephone Encounter (Signed)
Patient is calling to report she had chest tightness, SOB, sweating and weakness that occurred last night and lasted for several hours. Patient states it finally eased and she is doing some better today. Advised ED per protocol. 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 Explained to patient that I can not schedule her- will send note for review- patient is aware office may advise her to go to ED.  Reason for Disposition . [1] Chest pain lasts > 5 minutes AND [2] occurred in past 3 days (72 hours) (Exception: feels exactly the same as previously diagnosed heartburn and has accompanying sour taste in mouth)  Answer Assessment - Initial Assessment Questions 1. LOCATION: "Where does it hurt?"       Middle of chest 2. RADIATION: "Does the pain go anywhere else?" (e.g., into neck, jaw, arms, back)     Up and down- onto the throat 3. ONSET: "When did the chest pain begin?" (Minutes, hours or days)      About 11:00pm last 4. PATTERN "Does the pain come and go, or has it been constant since it started?"  "Does it get worse with exertion?"      Gone now 5. DURATION: "How long does it last" (e.g., seconds, minutes, hours)     Lasted hours 6. SEVERITY: "How bad is the pain?"  (e.g., Scale 1-10; mild, moderate, or severe)    - MILD (1-3): doesn't interfere with normal activities     - MODERATE (4-7): interferes with normal activities or awakens from sleep    - SEVERE (8-10): excruciating pain, unable to do any normal activities       Moderate- gone now 7. CARDIAC RISK FACTORS: "Do you have any history of heart problems or risk factors for heart disease?" (e.g., angina, prior heart attack; diabetes, high blood pressure, high cholesterol, smoker, or strong family history of heart disease)     Diabetes, family hx 8. PULMONARY RISK FACTORS: "Do  you have any history of lung disease?"  (e.g., blood clots in lung, asthma, emphysema, birth control pills)     no 9. CAUSE: "What do you think is causing the chest pain?"     unsure 10. OTHER SYMPTOMS: "Do you have any other symptoms?" (e.g., dizziness, nausea, vomiting, sweating, fever, difficulty breathing, cough)       Weakness, sweating, SOB- all better now 11. PREGNANCY: "Is there any chance you are pregnant?" "When was your last menstrual period?"       n/a  Protocols used: CHEST PAIN-A-AH

## 2019-12-10 NOTE — ED Triage Notes (Signed)
FIRST NURSE NOTE:  Pt told to come to ED by PCP's office because of chest pain.  On arrival pt in no distress, pt taken back for EKG and VS. Informed her she would be called back for triage and VS.

## 2019-12-10 NOTE — Telephone Encounter (Signed)
Patient called back to get a lab order to check for possible heart attack. Patient was told this morning by Nurse Triage to go to the emergency room. Dr. Beryle Flock did review noted and is aware that patient was sent to ER. Patient reports calling Dr. Mariah Milling, cardiology to ask for advise, they have not responded to her call. I advised patient to go to the ER for appropriate work up. After several minutes on the phone with her, patient agreed to go to the ER.

## 2019-12-10 NOTE — Telephone Encounter (Signed)
Noted  

## 2019-12-10 NOTE — ED Notes (Signed)
Pt reports she will be going home, spoke with Patient Relations Mellody Dance, says she is feeling better and reports she will follow up with PCP. Pt ambulatory without difficutly was informed to call 911 or return to ED if sxs worsened. Pt verbalized understanding.

## 2019-12-10 NOTE — Telephone Encounter (Signed)
°  Chronic Care Management   Outreach Note  12/10/2019 Name: Jessica Wheeler MRN: [id number] DOB: [birth year]  Primary Care Provider: Erasmo Downer, MD Reason for referral : Chronic Care Management    An unsuccessful telephone outreach was attempted today. [name] is currently engaged with the chronic care management team. Per chart review, she complained of chest pain earlier today and was advised to report to the Emergency Department for further evaluation.    Follow Up Plan: -Will monitor for  ED evaluation and discharge disposition. -Will attempt to reach [name] later this week.   Katha Cabal [facility] Care Management [phone]

## 2019-12-11 NOTE — Telephone Encounter (Signed)
Spoke with patient and she had a scare with an episode of pressure, sweaty, and weakness in her legs. She finally went to ED and her troponin levels were normal. So based on those it did not appear to be cardiac in nature. Checked to see what magnesium she took and it was the magnesium oxide. Inquired if she tried any Meloxicam and she stated no. She did mention she takes advil several times during the day for pain. Also inquired if she had spoken with her primary care provider about her symptoms. Given that troponin levels were normal reviewed that it could be from something else. She verbalized having appointment tomorrow with her primary care provider. Her next appointment would have been in February of next year but given her symptoms scheduled her next available with Dr. Mariah Milling in October and confirmed that with her. She verbalized understanding of our conversation, agreement with plan, and had no further questions at this time.

## 2019-12-12 ENCOUNTER — Ambulatory Visit (INDEPENDENT_AMBULATORY_CARE_PROVIDER_SITE_OTHER): Payer: PPO

## 2019-12-12 ENCOUNTER — Encounter: Payer: Self-pay | Admitting: Family Medicine

## 2019-12-12 ENCOUNTER — Ambulatory Visit (INDEPENDENT_AMBULATORY_CARE_PROVIDER_SITE_OTHER): Payer: PPO | Admitting: Family Medicine

## 2019-12-12 ENCOUNTER — Other Ambulatory Visit: Payer: Self-pay

## 2019-12-12 VITALS — BP 135/66 | HR 73 | Temp 98.2°F | Resp 16 | Wt 143.4 lb

## 2019-12-12 DIAGNOSIS — I10 Essential (primary) hypertension: Secondary | ICD-10-CM

## 2019-12-12 DIAGNOSIS — E1169 Type 2 diabetes mellitus with other specified complication: Secondary | ICD-10-CM | POA: Diagnosis not present

## 2019-12-12 DIAGNOSIS — M791 Myalgia, unspecified site: Secondary | ICD-10-CM

## 2019-12-12 DIAGNOSIS — R079 Chest pain, unspecified: Secondary | ICD-10-CM

## 2019-12-12 LAB — POCT GLYCOSYLATED HEMOGLOBIN (HGB A1C)
Est. average glucose Bld gHb Est-mCnc: 154
Hemoglobin A1C: 7 % — AB (ref 4.0–5.6)

## 2019-12-12 NOTE — Patient Instructions (Signed)

## 2019-12-12 NOTE — Assessment & Plan Note (Addendum)
Not at goal with A1c 7.0 Continue current medications Discussed possible changes to medications She will increase her exercise and we will wait to consider adding another medication until next follow-up UTD on vaccines, foot exam On ACEi Not on statin-see below Discussed diet and exercise F/u in 3 months

## 2019-12-12 NOTE — Assessment & Plan Note (Addendum)
Patient denies pain today Reviewed labs, EKG, and chest x-ray -benign The history seems consistent with esophageal spasm We discussed red flag symptoms for chest pain We discussed that if chest pain recurs she may need to follow-up with cardiology sooner or be seen in the emergency department If she has GERD symptoms, she can take H2 blocker PPI OTC

## 2019-12-12 NOTE — Chronic Care Management (AMB) (Signed)
Chronic Care Management   Follow Up Note   12/12/2019 Name: Jessica Wheeler MRN: [id number] DOB: 07-04-1940  Primary Care Provider: Erasmo Downer, MD Reason for referral : Chronic Care Management   Jessica Wheeler is a 79 y.o. year old female who is a primary care patient of Jessica Wheeler, Jessica Schlein, MD. She is currently engaged with the chronic care management team. A routine outreach was conducted today.  Review of Jessica Wheeler's status, including review of consultants reports, relevant labs and test results was conducted today. Collaboration with appropriate care team members was performed as part of the comprehensive evaluation and provision of chronic care management services.    SDOH (Social Determinants of Health) assessments performed: No    Outpatient Encounter Medications as of 12/12/2019  Medication Sig  . amiloride-hydrochlorothiazide (MODURETIC) 5-50 MG tablet TAKE 1/2 (ONE-HALF) TABLET BY MOUTH ONCE DAILY AS NEEDED FOR SWELLING  . amLODipine (NORVASC) 5 MG tablet Take 1 tablet by mouth once daily  . aspirin 81 MG EC tablet Take 81 mg by mouth daily.    Marland Kitchen Bioflavonoid Products (ESTER C PO) Take 500 mg by mouth daily. occassionally takes 2 tablets  . Cyanocobalamin (VITAMIN B-12) 1000 MCG/15ML LIQD Take 1,000 mcg by mouth daily.   Marland Kitchen ezetimibe (ZETIA) 10 MG tablet Take 1 tablet (10 mg total) by mouth daily.  . Ibuprofen (ADVIL) 200 MG CAPS Take 1 capsule by mouth daily.    Marland Kitchen latanoprost (XALATAN) 0.005 % ophthalmic solution Reported on 10/12/2015 - both eyes nightly  . losartan (COZAAR) 100 MG tablet Take 1 tablet by mouth once daily  . MAGNESIUM PO Take 250 mg by mouth daily.   . metFORMIN (GLUCOPHAGE) 500 MG tablet TAKE 2 TABLETS BY MOUTH TWICE DAILY WITH MEALS   No facility-administered encounter medications on file as of 12/12/2019.       Goals Addressed            This Visit's Progress   . Chronic Disease Management       CARE PLAN ENTRY (see longitudinal plan  of care for additional care plan information)  Current Barriers:  . Chronic Disease Management support and education needs related to HTN, CAD, DM and Hyperlipidemia. (Recent C-Spine Injury)  Case Manager Clinical Goal(s):  Marland Kitchen Over the next 120 days, patient will not require hospitalization or emergent care d/t complications r/t chronic illnesses. . Over the next 90 days, patient will take all medications as prescribed. . Over the next 90 days, patient will attend all appointments as scheduled. . Over the next 90 days, patient will monitor blood pressure and record readings. . Over the next 90 days, patient will adhere to recommended cardiac prudent/diabetic diet. . Over the next 90 days, patient will follow recommended safety measures to prevent falls and injuries. . Over the next 60 days, patient will adhere to treatment plan outlined by the Orthopedic surgical team. . Over the next 30 days, patient will update care management team if in-home assistance is needed.   Interventions:  . Inter-disciplinary care team collaboration (see longitudinal plan of care) . Reviewed medications, home BP and blood glucose monitoring. Continues to take medications as prescribed. Reports blood pressure and blood glucose readings have been within the established range  Denies medication recent medication changes or concerns with prescription cost. . Reviewed home safety measures. Reports ambulating well adhering to activity restrictions and performing strengthening/stretching exercises as recommended. Reports being cleared by Dr. Shelle Iron last month. Reports driving without difficulty and performing  self care independently. She continues to wear her neck collar during outings. Denies recent falls.  . Discussed plans for care management follow up. Reports doing very well today. She was recently evaluated in the ED for cardiac related symptoms and what she describes as spasms in her esophagus. Reports these these  symptoms have resolved. She is very knowledgeable of reportable s/sx and indications for returning to the ED. Feels that she is doing very well in the home with good family support. She was able to successfully arrange services for light housework as needed but otherwise remains independent in the home. Denies urgent concerns or changes in care management needs. She will follow up with the Cardiology team on 02/03/20. Next PCP visit scheduled for 03/13/20.   Patient Self Care Activities:  . Self administers medications  . Attends scheduled provider appointments . Calls pharmacy for medication refills . Performs ADL's independently   Please see past updates related to this goal by clicking on the "Past Updates" button in the selected goal         PLAN The care management team will follow-up with Jessica Wheeler within the next 2-3 months.   [facility] Care [facility] [phone]

## 2019-12-12 NOTE — Assessment & Plan Note (Signed)
Well controlled Continue current medications Recheck metabolic panel F/u in 6 months  

## 2019-12-12 NOTE — Progress Notes (Signed)
Established patient visit   Patient: Jessica Wheeler   DOB: 1940-08-22   79 y.o. Female  MRN: [id number] Visit Date: 12/12/2019  Today's healthcare provider: Shirlee Latch, MD   I,Sulibeya S Dimas,acting as a scribe for Shirlee Latch, MD.,have documented all relevant documentation on the behalf of Shirlee Latch, MD,as directed by  Shirlee Latch, MD while in the presence of Shirlee Latch, MD.  Chief Complaint  Patient presents with  . Follow-up   Subjective    HPI  Follow up ER visit  Patient went to [facility] ER for scare with an episode of chest pressure, sweaty, nausea and weakness in her legs on 12/10/2019. Patient reports one episode only, lasted about one hour. Patient reports having leg weakness also. Patient denies any other symptoms. She had blood work done, EKG and chest x-ray then she left without being seen. Treatment for this included labs. She reports excellent compliance with treatment. She reports this condition is Improved.  Troponins negative x2.  EKG unchanged from baseline.  Chest x-ray without acute cardiopulmonary abnormality.  Chest pain is resolved.  It was a sharp central chest pain radiating from epigastrium to her neck that was intense for several minutes and then slowly eased off over the next hour.  This occurred while she was laying down and may have woken her from sleep.  ----------------------------------------------------------------------------------------- Hypertension, follow-up  BP Readings from Last 3 Encounters:  12/12/19 135/66  12/10/19 (!) 133/56  09/21/19 (!) 173/81   Wt Readings from Last 3 Encounters:  12/12/19 143 lb 6.4 oz (65 kg)  12/10/19 142 lb (64.4 kg)  09/21/19 142 lb (64.4 kg)     She was last seen for hypertension 5 months ago.  BP at that visit was 145/72. Management since that visit includes no changes.  She reports excellent compliance with treatment. She is not having side effects.  She is  following a Regular diet. She is exercising. She does not smoke.  Use of agents associated with hypertension: none.   Outside blood pressures are stable. Symptoms: Yes chest pain Yes chest pressure  No palpitations No syncope  No dyspnea No orthopnea  No paroxysmal nocturnal dyspnea No lower extremity edema   Pertinent labs: Lab Results  Component Value Date   CHOL 186 03/15/2019   HDL 89 03/15/2019   LDLCALC 81 03/15/2019   TRIG 88 03/15/2019   CHOLHDL 2.1 03/15/2019   Lab Results  Component Value Date   NA 132 (L) 12/10/2019   K 3.7 12/10/2019   CREATININE 0.82 12/10/2019   GFRNONAA >60 12/10/2019   GFRAA >60 12/10/2019   GLUCOSE 129 (H) 12/10/2019     The ASCVD Risk score (Goff DC Jr., et al., 2013) failed to calculate for the following reasons:   The 2013 ASCVD risk score is only valid for ages 72 to 36   --------------------------------------------------------------------------------------------------- Diabetes Mellitus Type II, Follow-up  Lab Results  Component Value Date   HGBA1C 7.0 (A) 12/12/2019   HGBA1C 7.0 (H) 07/04/2019   HGBA1C 7.2 (A) 03/15/2019   Wt Readings from Last 3 Encounters:  12/12/19 143 lb 6.4 oz (65 kg)  12/10/19 142 lb (64.4 kg)  09/21/19 142 lb (64.4 kg)   Last seen for diabetes 5 months ago.  Management since then includes no changes. She reports excellent compliance with treatment. She is not having side effects.  Symptoms: No fatigue No foot ulcerations  No appetite changes Yes nausea  No paresthesia of the feet  No  polydipsia  No polyuria No visual disturbances   No vomiting     Home blood sugar records: None  Episodes of hypoglycemia? No    Current insulin regiment: none Most Recent Eye Exam: UTD Current exercise: walking Current diet habits: in general, a "healthy" diet    Pertinent Labs: Lab Results  Component Value Date   CHOL 186 03/15/2019   HDL 89 03/15/2019   LDLCALC 81 03/15/2019   TRIG 88 03/15/2019     CHOLHDL 2.1 03/15/2019   Lab Results  Component Value Date   NA 132 (L) 12/10/2019   K 3.7 12/10/2019   CREATININE 0.82 12/10/2019   GFRNONAA >60 12/10/2019   GFRAA >60 12/10/2019   GLUCOSE 129 (H) 12/10/2019     ---------------------------------------------------------------------------------------------------   Patient Active Problem List   Diagnosis Date Noted  . Myalgia 12/13/2019  . Hyperlipidemia associated with type 2 diabetes mellitus (HCC) 03/15/2019  . Fatigue 12/11/2017  . Lower extremity edema 06/29/2017  . Primary osteoarthritis of left knee 03/10/2016  . Depression 12/28/2015  . Allergic rhinitis 01/31/2015  . Benign paroxysmal positional nystagmus 01/31/2015  . Bladder infection, chronic 01/31/2015  . Glaucoma 01/31/2015  . Alopecia 01/31/2015  . Chronic hyponatremia 01/31/2015  . Muscle spasm 01/31/2015  . 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 Anxiety 11/04/2014  . Muscle weakness 06/24/2014  . Fibrocystic breast disease 04/30/2013  . 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 Renal artery stenosis (HCC) 04/18/2012  . Chest pain 10/10/2011  . Murmur 10/10/2011  . 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HCC) 03/31/2011  . HTN (hypertension) 08/03/2010  . CAD (coronary artery disease) 08/03/2010  . DM (diabetes mellitus) (HCC) 08/03/2010   Social History   Tobacco Use  . Smoking status: Former Smoker    Packs/day: 1.00    Years: 15.00    Pack years: 15.00    Types: Cigarettes    Quit date: 08/03/2003    Years since quitting: 16.3  . Smokeless tobacco: Never Used  Vaping Use  . Vaping Use: Never used  Substance Use Topics  . Alcohol use: No  . Drug use: No   Family History  Problem Relation Age of Onset  . Diabetes Mother   . Hypertension Mother   . Anxiety disorder Mother   . Heart disease Father        rheumatic heart disease  . Heart attack Father   . Rheum arthritis Father   . Colon cancer Brother   . Hypertension Maternal  Grandmother   . Hypertension Maternal Grandfather   . Breast cancer Maternal Aunt 60  . Breast cancer Cousin 50   Allergies  Allergen Reactions  . Beta Adrenergic Blockers     Chest pressure/difficulty breathing  . Jardiance [Empagliflozin]     Muscle cramps  . Levaquin [Levofloxacin In D5w]     Low blood sugar; does take this when she needs it. She just makes sure to control her BS to avoid hypoglycemia.  . Levofloxacin   . Lisinopril Cough  . Prevnar [Pneumococcal 13-Val Conj Vacc] Rash    Joint Pain  . Sulfa Antibiotics Rash    "makes my skin raw"       Medications: Outpatient Medications Prior to Visit  Medication Sig  . amiloride-hydrochlorothiazide (MODURETIC) 5-50 MG tablet TAKE 1/2 (ONE-HALF) TABLET BY MOUTH ONCE DAILY AS NEEDED FOR SWELLING  . amLODipine (NORVASC) 5 MG tablet Take 1 tablet by mouth once daily  . aspirin 81 MG EC tablet Take 81 mg by mouth  daily.    . Bioflavonoid Products (ESTER C PO) Take 500 mg by mouth daily. occassionally takes 2 tablets  . Cyanocobalamin (VITAMIN B-12) 1000 MCG/15ML LIQD Take 1,000 mcg by mouth daily.   Marland Kitchen. ezetimibe (ZETIA) 10 MG tablet Take 1 tablet (10 mg total) by mouth daily.  . Ibuprofen (ADVIL) 200 MG CAPS Take 1 capsule by mouth daily.    Marland Kitchen. latanoprost (XALATAN) 0.005 % ophthalmic solution Reported on 10/12/2015 - both eyes nightly  . losartan (COZAAR) 100 MG tablet Take 1 tablet by mouth once daily  . MAGNESIUM PO Take 250 mg by mouth daily.   . metFORMIN (GLUCOPHAGE) 500 MG tablet TAKE 2 TABLETS BY MOUTH TWICE DAILY WITH MEALS  . [DISCONTINUED] Calcium-Magnesium-Vitamin D (CALCIUM 1200+D3 PO) Take by mouth.  . [DISCONTINUED] meloxicam (MOBIC) 15 MG tablet Take 0.5-1 tablets (7.5-15 mg total) by mouth daily.   No facility-administered medications prior to visit.    Review of Systems  Constitutional: Negative for appetite change, chills and fatigue.  Respiratory: Negative for chest tightness and shortness of breath.     Cardiovascular: Negative for chest pain, palpitations and leg swelling.  Musculoskeletal: Positive for myalgias.  Neurological: Negative.     Last CBC Lab Results  Component Value Date   WBC 9.0 12/10/2019   HGB 13.1 12/10/2019   HCT 37.0 12/10/2019   MCV 90.2 12/10/2019   MCH 32.0 12/10/2019   RDW 13.6 12/10/2019   PLT 251 12/10/2019   Last metabolic panel Lab Results  Component Value Date   GLUCOSE 129 (H) 12/10/2019   NA 132 (L) 12/10/2019   K 3.7 12/10/2019   CL 96 (L) 12/10/2019   CO2 23 12/10/2019   BUN 17 12/10/2019   CREATININE 0.82 12/10/2019   GFRNONAA >60 12/10/2019   GFRAA >60 12/10/2019   CALCIUM 9.6 12/10/2019   PROT 6.8 03/15/2019   ALBUMIN 4.7 03/15/2019   LABGLOB 2.1 03/15/2019   AGRATIO 2.2 03/15/2019   BILITOT 0.5 03/15/2019   ALKPHOS 50 03/15/2019   AST 32 03/15/2019   ALT 33 (H) 03/15/2019   ANIONGAP 13 12/10/2019   Last lipids Lab Results  Component Value Date   CHOL 186 03/15/2019   HDL 89 03/15/2019   LDLCALC 81 03/15/2019   TRIG 88 03/15/2019   CHOLHDL 2.1 03/15/2019   Last thyroid functions Lab Results  Component Value Date   TSH 3.880 12/11/2017   Last vitamin D Lab Results  Component Value Date   VD25OH 29.2 (L) 12/11/2017   Last vitamin B12 and Folate Lab Results  Component Value Date   VITAMINB12 1,190 12/11/2017      Objective    BP 135/66 (BP Location: Left Arm, Patient Position: Sitting, Cuff Size: Normal)   Pulse 73   Temp 98.2 F (36.8 C) (Oral)   Resp 16   Wt 143 lb 6.4 oz (65 kg)   BMI 25.40 kg/m  BP Readings from Last 3 Encounters:  12/12/19 135/66  12/10/19 (!) 133/56  09/21/19 (!) 173/81   Wt Readings from Last 3 Encounters:  12/12/19 143 lb 6.4 oz (65 kg)  12/10/19 142 lb (64.4 kg)  09/21/19 142 lb (64.4 kg)      Physical Exam Vitals reviewed.  Constitutional:      General: She is not in acute distress.    Appearance: Normal appearance. She is well-developed. She is not diaphoretic.   HENT:     Head: Normocephalic and atraumatic.  Eyes:     General: No scleral  icterus.    Conjunctiva/sclera: Conjunctivae normal.  Neck:     Thyroid: No thyromegaly.  Cardiovascular:     Rate and Rhythm: Normal rate and regular rhythm.     Pulses: Normal pulses.     Heart sounds: Normal heart sounds. No murmur heard.   Pulmonary:     Effort: Pulmonary effort is normal. No respiratory distress.     Breath sounds: Normal breath sounds. No wheezing, rhonchi or rales.  Musculoskeletal:     Cervical back: Neck supple.     Right lower leg: No edema.     Left lower leg: No edema.  Lymphadenopathy:     Cervical: No cervical adenopathy.  Skin:    General: Skin is warm and dry.     Findings: No rash.  Neurological:     Mental Status: She is alert and oriented to person, place, and time. Mental status is at baseline.  Psychiatric:        Mood and Affect: Mood normal.        Behavior: Behavior normal.     Results for orders placed or performed in visit on 12/12/19  POCT glycosylated hemoglobin (Hb A1C)  Result Value Ref Range   Hemoglobin A1C 7.0 (A) 4.0 - 5.6 %   Est. average glucose Bld gHb Est-mCnc 154     Assessment & Plan     Problem List Items Addressed This Visit      Cardiovascular and Mediastinum   HTN (hypertension)    Well controlled Continue current medications Recheck metabolic panel F/u in 6 months         Endocrine   DM (diabetes mellitus) (HCC)    Not at goal with A1c 7.0 Continue current medications Discussed possible changes to medications She will increase her exercise and we will wait to consider adding another medication until next follow-up UTD on vaccines, foot exam On ACEi Not on statin-see below Discussed diet and exercise F/u in 3 months       Relevant Orders   POCT glycosylated hemoglobin (Hb A1C) (Completed)     Other   Chest pain - Primary    Patient denies pain today Reviewed labs, EKG, and chest x-ray -benign The history  seems consistent with esophageal spasm We discussed red flag symptoms for chest pain We discussed that if chest pain recurs she may need to follow-up with cardiology sooner or be seen in the emergency department If she has GERD symptoms, she can take H2 blocker PPI OTC       Myalgia    Patient with significant myalgias, not on a statin We will continue to monitor her lipids on Zetia instead          Return in about 3 months (around 03/13/2020) for chronic disease f/u.      I, Shirlee Latch, MD, have reviewed all documentation for this visit. The documentation on 12/13/19 for the exam, diagnosis, procedures, and orders are all accurate and complete.   Uchenna Rappaport, Marzella Schlein, MD, MPH [facility]

## 2019-12-13 ENCOUNTER — Encounter: Payer: Self-pay | Admitting: Family Medicine

## 2019-12-13 DIAGNOSIS — T466X5A Adverse effect of antihyperlipidemic and antiarteriosclerotic drugs, initial encounter: Secondary | ICD-10-CM | POA: Insufficient documentation

## 2019-12-13 DIAGNOSIS — M791 Myalgia, unspecified site: Secondary | ICD-10-CM | POA: Insufficient documentation

## 2019-12-13 NOTE — Assessment & Plan Note (Signed)
Patient with significant myalgias, not on a statin We will continue to monitor her lipids on Zetia instead

## 2019-12-16 NOTE — Patient Instructions (Signed)
Thank you for allowing the Chronic Care Management team to participate in your care.   Goals Addressed            This Visit's Progress   . Chronic Disease Management       CARE PLAN ENTRY (see longitudinal plan of care for additional care plan information)  Current Barriers:  . Chronic Disease Management support and education needs related to HTN, CAD, DM and Hyperlipidemia. (Recent C-Spine Injury)  Case Manager Clinical Goal(s):  Marland Kitchen Over the next 120 days, patient will not require hospitalization or emergent care d/t complications r/t chronic illnesses. . Over the next 90 days, patient will take all medications as prescribed. . Over the next 90 days, patient will attend all appointments as scheduled. . Over the next 90 days, patient will monitor blood pressure and record readings. . Over the next 90 days, patient will adhere to recommended cardiac prudent/diabetic diet. . Over the next 90 days, patient will follow recommended safety measures to prevent falls and injuries. . Over the next 60 days, patient will adhere to treatment plan outlined by the Orthopedic surgical team. . Over the next 30 days, patient will update care management team if in-home assistance is needed.   Interventions:  . Inter-disciplinary care team collaboration (see longitudinal plan of care) . Reviewed medications, home BP and blood glucose monitoring. Continues to take medications as prescribed. Reports blood pressure and blood glucose readings have been within the established range  Denies medication recent medication changes or concerns with prescription cost. . Reviewed home safety measures. Reports ambulating well adhering to activity restrictions and performing strengthening/stretching exercises as recommended. Reports being cleared by Dr. Shelle Iron last month. Reports driving without difficulty and performing self care independently. She continues to wear her neck collar during outings. Denies recent falls.   . Discussed plans for care management follow up. Reports doing very well today. She was recently evaluated in the ED for cardiac related symptoms and what she describes as spasms in her esophagus. Reports these these symptoms have resolved. She is very knowledgeable of reportable s/sx and indications for returning to the ED. Feels that she is doing very well in the home with good family support. She was able to successfully arrange services for light housework as needed but otherwise remains independent in the home. Denies urgent concerns or changes in care management needs. She will follow up with the Cardiology team on 02/03/20. Next PCP visit scheduled for 03/13/20.   Patient Self Care Activities:  . Self administers medications  . Attends scheduled provider appointments . Calls pharmacy for medication refills . Performs ADL's independently   Please see past updates related to this goal by clicking on the "Past Updates" button in the selected goal         Ms. Uncapher verbalized understanding of the information discussed during the telephonic outreach today. Declined need for a mailed/printed copy of the instructions.   The care management team will follow-up with Ms. Pereyra within the next 2-3 months.   [facility] Care Management [facility] [phone]

## 2019-12-30 DIAGNOSIS — E119 Type 2 diabetes mellitus without complications: Secondary | ICD-10-CM | POA: Diagnosis not present

## 2019-12-30 DIAGNOSIS — H43813 Vitreous degeneration, bilateral: Secondary | ICD-10-CM | POA: Diagnosis not present

## 2019-12-30 DIAGNOSIS — Z7984 Long term (current) use of oral hypoglycemic drugs: Secondary | ICD-10-CM | POA: Diagnosis not present

## 2019-12-30 DIAGNOSIS — Z961 Presence of intraocular lens: Secondary | ICD-10-CM | POA: Diagnosis not present

## 2019-12-30 DIAGNOSIS — H401131 Primary open-angle glaucoma, bilateral, mild stage: Secondary | ICD-10-CM | POA: Diagnosis not present

## 2020-01-10 DIAGNOSIS — M542 Cervicalgia: Secondary | ICD-10-CM | POA: Diagnosis not present

## 2020-01-14 DIAGNOSIS — M542 Cervicalgia: Secondary | ICD-10-CM | POA: Diagnosis not present

## 2020-01-16 DIAGNOSIS — M542 Cervicalgia: Secondary | ICD-10-CM | POA: Diagnosis not present

## 2020-01-17 ENCOUNTER — Other Ambulatory Visit: Payer: Self-pay

## 2020-01-17 ENCOUNTER — Ambulatory Visit (INDEPENDENT_AMBULATORY_CARE_PROVIDER_SITE_OTHER): Payer: PPO | Admitting: Family Medicine

## 2020-01-17 VITALS — BP 145/56 | HR 78 | Temp 98.3°F | Wt 142.0 lb

## 2020-01-17 DIAGNOSIS — R102 Pelvic and perineal pain: Secondary | ICD-10-CM | POA: Diagnosis not present

## 2020-01-17 DIAGNOSIS — N3281 Overactive bladder: Secondary | ICD-10-CM

## 2020-01-17 DIAGNOSIS — Z23 Encounter for immunization: Secondary | ICD-10-CM | POA: Diagnosis not present

## 2020-01-17 DIAGNOSIS — N3289 Other specified disorders of bladder: Secondary | ICD-10-CM | POA: Diagnosis not present

## 2020-01-17 LAB — POCT URINALYSIS DIPSTICK
Bilirubin, UA: NEGATIVE
Blood, UA: NEGATIVE
Glucose, UA: NEGATIVE
Ketones, UA: NEGATIVE
Leukocytes, UA: NEGATIVE
Nitrite, UA: NEGATIVE
Protein, UA: NEGATIVE
Spec Grav, UA: 1.02 (ref 1.010–1.025)
Urobilinogen, UA: 0.2 E.U./dL
pH, UA: 6.5 (ref 5.0–8.0)

## 2020-01-17 MED ORDER — OXYBUTYNIN CHLORIDE ER 5 MG PO TB24: 5.0000 mg | ORAL_TABLET | Freq: Every day | ORAL | 12 refills | Status: DC

## 2020-01-17 NOTE — Progress Notes (Signed)
Established patient visit   Patient: Jessica Wheeler   DOB: March 25, 1941   79 y.o. Female  MRN: [id number] Visit Date: 01/17/2020  Today's healthcare provider: Shirlee Latch, MD   Chief Complaint  Patient presents with  . Urinary Tract Infection   Subjective    HPI   Patient is an 79 year old female who presents with suprapubic cramps/spasms.  She states it has no relationship to urinating or bowel movements.  She states in the beginning she felt like you would feel after taking a laxative.  However, she did not have any bowel movements with it.    She reports that when the pain is at it's worst she rates it as a 8/10, when at its best a 2/10.  She began having the symptoms 8 days ago.  She denies fever or hematuria.  She does states several weeks ago she had blood on the tissue paper after a bowel movement.  This occurred twice and she has had none since.  Patient states she did take Urostat over the weekend with no change in the symptoms. She denies any sense of difficulty starting urination or emptying her bladder completely.   Medications: Outpatient Medications Prior to Visit  Medication Sig  . amiloride-hydrochlorothiazide (MODURETIC) 5-50 MG tablet TAKE 1/2 (ONE-HALF) TABLET BY MOUTH ONCE DAILY AS NEEDED FOR SWELLING  . amLODipine (NORVASC) 5 MG tablet Take 1 tablet by mouth once daily  . aspirin 81 MG EC tablet Take 81 mg by mouth daily.    . Cyanocobalamin (VITAMIN B-12) 1000 MCG/15ML LIQD Take 1,000 mcg by mouth daily.   Marland Kitchen ezetimibe (ZETIA) 10 MG tablet Take 1 tablet (10 mg total) by mouth daily.  . Ibuprofen (ADVIL) 200 MG CAPS Take 1 capsule by mouth daily.    Marland Kitchen latanoprost (XALATAN) 0.005 % ophthalmic solution Reported on 10/12/2015 - both eyes nightly  . losartan (COZAAR) 100 MG tablet Take 1 tablet by mouth once daily  . MAGNESIUM PO Take 250 mg by mouth daily.   . metFORMIN (GLUCOPHAGE) 500 MG tablet TAKE 2 TABLETS BY MOUTH TWICE DAILY WITH MEALS  .  Bioflavonoid Products (ESTER C PO) Take 500 mg by mouth daily. occassionally takes 2 tablets   No facility-administered medications prior to visit.    Review of Systems  Constitutional: Positive for fatigue.  Gastrointestinal: Positive for blood in stool (previously). Negative for abdominal pain, constipation, diarrhea, nausea, rectal pain and vomiting.  Genitourinary: Positive for pelvic pain. Negative for difficulty urinating, dysuria, enuresis, flank pain, frequency, hematuria and urgency.      Objective    BP (!) 145/56 (BP Location: Right Arm, Patient Position: Sitting, Cuff Size: Normal)   Pulse 78   Temp 98.3 F (36.8 C) (Oral)   Wt 142 lb (64.4 kg)   SpO2 98%   BMI 25.15 kg/m    Physical Exam Constitutional:      Appearance: Normal appearance. She is normal weight.  HENT:     Head: Normocephalic and atraumatic.  Eyes:     Pupils: Pupils are equal, round, and reactive to light.  Cardiovascular:     Rate and Rhythm: Normal rate and regular rhythm.     Pulses: Normal pulses.     Heart sounds: Normal heart sounds.  Pulmonary:     Effort: Pulmonary effort is normal.     Breath sounds: Normal breath sounds.  Abdominal:     General: Abdomen is flat. There is no distension.  Palpations: Abdomen is soft.     Tenderness: There is abdominal tenderness (suprapubic).  Musculoskeletal:     Right lower leg: No edema.     Left lower leg: No edema.  Neurological:     General: No focal deficit present.     Mental Status: She is alert and oriented to person, place, and time. Mental status is at baseline.  Psychiatric:        Mood and Affect: Mood normal.        Behavior: Behavior normal.      No results found for any visits on 01/17/20.  Assessment & Plan      Problem List Items Addressed This Visit      Genitourinary   Overactive bladder    Patient with bladder spasms.  Offerred referral to specialist.  Recommend trying Oxybutynin will follow up in 3 months       Relevant Medications   oxybutynin (DITROPAN-XL) 5 MG 24 hr tablet    Other Visit Diagnoses    Bladder spasms    -  Primary   Relevant Medications   oxybutynin (DITROPAN-XL) 5 MG 24 hr tablet   Other Relevant Orders   Urine Culture   Suprapubic pressure       Relevant Orders   POCT urinalysis dipstick   Need for influenza vaccination       Relevant Orders   Flu Vaccine QUAD High Dose(Fluad)      No follow-ups on file.      I, Shirlee Latch, MD, have reviewed all documentation for this visit. The documentation on 01/17/20 for the exam, diagnosis, procedures, and orders are all accurate and complete.   Tallon Gertz, Marzella Schlein, MD, MPH [facility]

## 2020-01-17 NOTE — Assessment & Plan Note (Signed)
Patient with bladder spasms.  Offerred referral to specialist.  Recommend trying Oxybutynin will follow up in 3 months

## 2020-01-18 ENCOUNTER — Other Ambulatory Visit: Payer: Self-pay | Admitting: Family Medicine

## 2020-01-18 DIAGNOSIS — I10 Essential (primary) hypertension: Secondary | ICD-10-CM

## 2020-01-19 LAB — URINE CULTURE

## 2020-01-21 ENCOUNTER — Telehealth: Payer: Self-pay

## 2020-01-21 NOTE — Telephone Encounter (Signed)
-----   Message from Erasmo Downer, MD sent at 01/20/2020  8:11 AM EDT ----- No growth on urine culture

## 2020-01-21 NOTE — Telephone Encounter (Signed)
Written by Erasmo Downer, MD on 01/20/2020 8:11 AM EDT Seen by patient Jessica Wheeler on 01/20/2020 8:57 PM

## 2020-01-22 DIAGNOSIS — M542 Cervicalgia: Secondary | ICD-10-CM | POA: Diagnosis not present

## 2020-01-24 DIAGNOSIS — M542 Cervicalgia: Secondary | ICD-10-CM | POA: Diagnosis not present

## 2020-01-27 DIAGNOSIS — M542 Cervicalgia: Secondary | ICD-10-CM | POA: Diagnosis not present

## 2020-01-29 DIAGNOSIS — M542 Cervicalgia: Secondary | ICD-10-CM | POA: Diagnosis not present

## 2020-01-31 NOTE — Progress Notes (Signed)
Cardiology Office Note  Date:  02/03/2020   ID:  Jessica Wheeler, DOB [birth year], MRN [id number]  PCP:  Erasmo Downer, MD   Chief Complaint  Patient presents with  . OTHER    Chest pressure/nausea/weak legs. Meds reviewed verbally with pt.    HPI:  [name] is a very pleasant 79 year old woman with a history of  diabetes, hemoglobin A1c 7.0 hypertension,  Former smoker  aortic dissection following catheterization,   short region of dissection in the distal aorta, seen on CT scan in 2010 renal artery stenosis followed by Dr. Wyn Quaker.  Chronically low sodium.  Cardiac catheterization in 2007  showed 20% proximal RCA disease and 20% LAD disease.short region of dissection in the distal aorta, seen on CT scan in 2010 aortoiliac atherosclerosis, followed by Dr. Wyn Quaker who presents for routine followup of her coronary artery disease.   Reports having recent trauma to her neck 08/2019, heavy three window shade came down on her neck Neck xray: Reviewed with her Displaced fracture through the C7 spinous process. Advanced degenerative disc and facet disease.  Residual finger numbness  CT neck also reviewed Severe spinal stenosis because of chronic calcified disc herniations at C3-4, C4-5 and C5-6. Facet arthropathy with foraminal stenosis that could cause neural compression at C3-4 left more than right, C4-5 left more than right, C5-6 right worse than left and C6-7 bilateral.  She has stopped a lot of her activities, trying to be very cautious  Aug 2021 Had episode of upper epigastric pain, overnight 30 to 45 min Was diaphoretic No further episodes since that time Etiology unclear Went to the emergency room, cardiac enzymes negative, did not stay for further work-up She is concerned about aortic dissection  EKG personally reviewed by myself on todays visit Shows normal sinus rhythm with rate 82 bpm right bundle branch block  Other past medical history reviewed lower  extremity arterial studies from Dr. Wyn Quaker  Noninvasive studies show mildly elevated velocities in the common iliac arteries bilaterally although this does not appear to be significantly high-grade.  Her aorta is not aneurysmal.  Previously with Intolerance to imdur, ziac  feels terrible on beta blockers  Previous  Echocardiogram was performed that was normal  Stress test/Myoview was also normal showing no ischemia .    PMH:   has a past medical history of Allergy, Arthritis, Bundle branch block, right, Diabetes mellitus, Diffuse cystic mastopathy, Dissection, aorta (HCC), Diverticulosis, Family history of malignant neoplasm of gastrointestinal tract, Glaucoma (2005), Heart murmur, History of bronchitis, History of pancreatitis (2005), Hypertension (1982), Personal history of tobacco use, presenting hazards to health, RBBB, and Ulcer.  PSH:    Past Surgical History:  Procedure Laterality Date  . ABDOMINAL HYSTERECTOMY  1991  . APPENDECTOMY    . BACK SURGERY  1982   for lumbar ruptured disc. No hardware, no discectomy, no fusion  . BLADDER SURGERY    . BREAST BIOPSY Right 1980's   benign  . BREAST EXCISIONAL BIOPSY Right 1980s   benign  . BREAST SURGERY     biopsy  . CARDIAC CATHETERIZATION     Dr. Lady Gary, few years ago  . CATARACT EXTRACTION W/PHACO Left 08/31/2015   Procedure: CATARACT EXTRACTION PHACO AND INTRAOCULAR LENS PLACEMENT (IOC);  Surgeon: Sherald Hess, MD;  Location: [facility];  Service: Ophthalmology;  Laterality: Left;  DIABETIC - oral meds  . CATARACT EXTRACTION W/PHACO Right 10/12/2015   Procedure: CATARACT EXTRACTION PHACO AND INTRAOCULAR LENS PLACEMENT (IOC) right eye;  Surgeon: Sherald Hess, MD;  Location: [facility];  Service: Ophthalmology;  Laterality: Right;  DIABETIC - oral meds  . CHOLECYSTECTOMY  1994  . COLONOSCOPY  2008,02/22/2012   Dr. Sankar-2013  . COLONOSCOPY WITH PROPOFOL N/A 04/04/2017   Procedure: COLONOSCOPY  WITH PROPOFOL;  Surgeon: [name], MD;  Location: [facility];  Service: Endoscopy;  Laterality: N/A;  . REFRACTIVE SURGERY      Current Outpatient Medications  Medication Sig Dispense Refill  . amiloride-hydrochlorothiazide (MODURETIC) 5-50 MG tablet TAKE 1/2 (ONE-HALF) TABLET BY MOUTH ONCE DAILY AS NEEDED FOR SWELLING (Patient taking differently: TAKE 1/4 TABLET BY MOUTH ONCE DAILY AS NEEDED FOR SWELLING) 45 tablet 0  . amLODipine (NORVASC) 5 MG tablet Take 1 tablet by mouth once daily 90 tablet 1  . aspirin 81 MG EC tablet Take 81 mg by mouth daily.      . Cyanocobalamin (VITAMIN B-12) 1000 MCG/15ML LIQD Take 1,000 mcg by mouth daily.     Marland Kitchen ezetimibe (ZETIA) 10 MG tablet Take 1 tablet (10 mg total) by mouth daily. 30 tablet 11  . Ibuprofen (ADVIL) 200 MG CAPS Take 1 capsule by mouth daily.      Marland Kitchen latanoprost (XALATAN) 0.005 % ophthalmic solution Reported on 10/12/2015 - both eyes nightly  3  . losartan (COZAAR) 100 MG tablet Take 1 tablet by mouth once daily 90 tablet 1  . MAGNESIUM PO Take 250 mg by mouth daily.     . metFORMIN (GLUCOPHAGE) 500 MG tablet TAKE 2 TABLETS BY MOUTH TWICE DAILY WITH MEALS 360 tablet 0  . oxybutynin (DITROPAN-XL) 5 MG 24 hr tablet Take 1 tablet (5 mg total) by mouth at bedtime. (Patient taking differently: Take 5 mg by mouth daily as needed. ) 30 tablet 12   No current facility-administered medications for this visit.     Allergies:   Beta adrenergic blockers, Jardiance [empagliflozin], Levaquin [levofloxacin in d5w], Levofloxacin, Lisinopril, Prevnar [pneumococcal 13-val conj vacc], and Sulfa antibiotics   Social History:  The patient  reports that she quit smoking about 16 years ago. Her smoking use included cigarettes. She has a 15.00 pack-year smoking history. She has never used smokeless tobacco. She reports that she does not drink alcohol and does not use drugs.   Family History:   family history includes Anxiety disorder in her mother;  Breast cancer (age of onset: 65) in her cousin; Breast cancer (age of onset: 55) in her maternal aunt; Colon cancer in her brother; Diabetes in her mother; Heart attack in her father; Heart disease in her father; Hypertension in her maternal grandfather, maternal grandmother, and mother; Rheum arthritis in her father.    Review of Systems: Review of Systems  Constitutional: Negative.   HENT: Negative.   Respiratory: Negative.   Cardiovascular: Negative.   Gastrointestinal: Negative.   Musculoskeletal: Positive for joint pain.  Neurological: Negative.   Psychiatric/Behavioral: Negative.   All other systems reviewed and are negative.   PHYSICAL EXAM: VS:  BP (!) 148/60 (BP Location: Left Arm, Patient Position: Sitting, Cuff Size: Normal)   Pulse 82   Ht 5\' 3"  (1.6 m)   Wt 141 lb 4 oz (64.1 kg)   SpO2 98%   BMI 25.02 kg/m  , BMI Body mass index is 25.02 kg/m. Constitutional:  oriented to person, place, and time. No distress.  HENT:  Head: Grossly normal Eyes:  no discharge. No scleral icterus.  Neck: No JVD, no carotid bruits  Cardiovascular: Regular rate and rhythm, no  murmurs appreciated Pulmonary/Chest: Clear to auscultation bilaterally, no wheezes or rails Abdominal: Soft.  no distension.  no tenderness.  Musculoskeletal: Normal range of motion Neurological:  normal muscle tone. Coordination normal. No atrophy Skin: Skin warm and dry Psychiatric: normal affect, pleasant   Recent Labs: 03/15/2019: ALT 33 03/20/2019: Magnesium 2.1 09/21/2019: B Natriuretic Peptide 29.4 12/10/2019: BUN 17; Creatinine, Ser 0.82; Hemoglobin 13.1; Platelets 251; Potassium 3.7; Sodium 132    Lipid Panel Lab Results  Component Value Date   CHOL 186 03/15/2019   HDL 89 03/15/2019   LDLCALC 81 03/15/2019   TRIG 88 03/15/2019    Wt Readings from Last 3 Encounters:  02/03/20 141 lb 4 oz (64.1 kg)  01/17/20 142 lb (64.4 kg)  12/12/19 143 lb 6.4 oz (65 kg)     ASSESSMENT AND  PLAN:  Coronary artery disease involving native coronary artery of native heart without angina pectoris -  Denies anginal symptoms, continue Zetia Previously declined statin  Essential hypertension -  Blood pressure is well controlled on today's visit. No changes made to the medications.  Pure hypercholesterolemia Continue Zetia  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HCC) CT scan ordered for reasons below  Upper epigastric pain Severe in nature with associated diaphoresis, etiology unclear She is very concerned about having any recurrent episodes, CT abdomen ordered with contrast  Type 2 diabetes mellitus with other specified complication, without long-term current use of insulin (HCC) Recommend strict diet, unable to exercise on a regular basis  Spinal stenosis She is concerned about her neck, recommend she talk with neurosurgery She has severe disease, also recent trauma with fracture   Total encounter time more than 25 minutes  Greater than 50% was spent in counseling and coordination of care with the patient    Orders Placed This Encounter  Procedures  . EKG 12-Lead     Signed, Dossie Arbour, M.D., Ph.D. 02/03/2020  [facility] [place], [place] [phone]

## 2020-02-03 ENCOUNTER — Other Ambulatory Visit: Payer: Self-pay

## 2020-02-03 ENCOUNTER — Ambulatory Visit (INDEPENDENT_AMBULATORY_CARE_PROVIDER_SITE_OTHER): Payer: PPO | Admitting: Cardiovascular Disease

## 2020-02-03 ENCOUNTER — Encounter: Payer: Self-pay | Admitting: Cardiovascular Disease

## 2020-02-03 VITALS — BP 148/60 | HR 82 | Ht 63.0 in | Wt 141.2 lb

## 2020-02-03 DIAGNOSIS — I25118 Atherosclerotic heart disease of native coronary artery with other forms of angina pectoris: Secondary | ICD-10-CM

## 2020-02-03 DIAGNOSIS — E1159 Type 2 diabetes mellitus with other circulatory complications: Secondary | ICD-10-CM

## 2020-02-03 DIAGNOSIS — I701 Atherosclerosis of renal artery: Secondary | ICD-10-CM

## 2020-02-03 DIAGNOSIS — I10 Essential (primary) hypertension: Secondary | ICD-10-CM | POA: Diagnosis not present

## 2020-02-03 DIAGNOSIS — R109 Unspecified abdominal pain: Secondary | ICD-10-CM | POA: Diagnosis not present

## 2020-02-03 DIAGNOSIS — E871 Hypo-osmolality and hyponatremia: Secondary | ICD-10-CM

## 2020-02-03 DIAGNOSIS — E782 Mixed hyperlipidemia: Secondary | ICD-10-CM | POA: Diagnosis not present

## 2020-02-03 DIAGNOSIS — I7102 Dissection of abdominal aorta: Secondary | ICD-10-CM

## 2020-02-03 NOTE — Patient Instructions (Addendum)
Number of Dr. Lovell Sheehan, neurosurgery in [place]   We will order a ABD CT with contrast, Severe abd pain, hx of aorta dissection  Medication Instructions:  No changes  If you need a refill on your cardiac medications before your next appointment, please call your pharmacy.    Lab work: No new labs needed   If you have labs (blood work) drawn today and your tests are completely normal, you will receive your results only by: Marland Kitchen MyChart Message (if you have MyChart) OR . A paper copy in the mail If you have any lab test that is abnormal or we need to change your treatment, we will call you to review the results.   Testing/Procedures: No new testing needed   Follow-Up: At [facility], you and your health needs are our priority.  As part of our continuing mission to provide you with exceptional heart care, we have created designated Provider Care Teams.  These Care Teams include your primary Cardiologist (physician) and Advanced Practice Providers (APPs -  Physician Assistants and Nurse Practitioners) who all work together to provide you with the care you need, when you need it.  . You will need a follow up appointment in 12 months  . Providers on your designated Care Team:   . Nicolasa Ducking, NP . Eula Listen, PA-C . Marisue Ivan, PA-C  Any Other Special Instructions Will Be Listed Below (If Applicable).  COVID-19 Vaccine Information can be found at: [email or web address] For questions related to vaccine distribution or appointments, please email vaccine@ .[email or web address] or call [phone].

## 2020-02-04 ENCOUNTER — Telehealth: Payer: Self-pay | Admitting: *Deleted

## 2020-02-04 DIAGNOSIS — M542 Cervicalgia: Secondary | ICD-10-CM | POA: Diagnosis not present

## 2020-02-04 NOTE — Addendum Note (Signed)
Addended by: Bryna Colander on: 02/04/2020 11:39 AM   Modules accepted: Orders

## 2020-02-04 NOTE — Telephone Encounter (Signed)
-----   Message from [facility] sent at 02/04/2020 11:09 AM EDT ----- Good morning,  This patient did not require auth. I updated the referral for this patient, okay to schedule.  Thanks so much,  [employer]

## 2020-02-04 NOTE — Telephone Encounter (Signed)
Spoke with patient and provided her with number to call and schedule her testing. She verbalized understanding with no further questions at this time.

## 2020-02-05 ENCOUNTER — Telehealth: Payer: Self-pay | Admitting: Cardiovascular Disease

## 2020-02-05 DIAGNOSIS — R109 Unspecified abdominal pain: Secondary | ICD-10-CM

## 2020-02-05 NOTE — Telephone Encounter (Signed)
Patients Ct has been scheduled and now was instructed to get labwork done prior. Patient would like to be able to go to the [facility] in the same building as her PCP  Please advise

## 2020-02-06 NOTE — Telephone Encounter (Signed)
Spoke with Jessica Wheeler over in scheduling to see what labs are needed and information regarding her medication. Labs needed for patient are Bun & Creat. Patient also had questions regarding Metformin and she spoke with radiology about this and they no longer have any restrictions regarding this medication. Advised that I would call and review this with patient. She verbalized understanding with no further questions at this time.

## 2020-02-06 NOTE — Telephone Encounter (Signed)
Spoke with patient and reviewed that she could continue her Metformin. She also inquired about the labs she needed to have done and would prefer to have those done at her PCP office which uses [facility]. Advised that I would put those orders in and that should be fine to go there. She was appreciative for the call with no further questions at this time.

## 2020-02-07 DIAGNOSIS — M542 Cervicalgia: Secondary | ICD-10-CM | POA: Diagnosis not present

## 2020-02-11 ENCOUNTER — Other Ambulatory Visit: Payer: Self-pay | Admitting: Family Medicine

## 2020-02-11 DIAGNOSIS — G992 Myelopathy in diseases classified elsewhere: Secondary | ICD-10-CM | POA: Diagnosis not present

## 2020-02-11 DIAGNOSIS — M4802 Spinal stenosis, cervical region: Secondary | ICD-10-CM | POA: Diagnosis not present

## 2020-02-11 DIAGNOSIS — M4712 Other spondylosis with myelopathy, cervical region: Secondary | ICD-10-CM | POA: Diagnosis not present

## 2020-02-11 DIAGNOSIS — E1169 Type 2 diabetes mellitus with other specified complication: Secondary | ICD-10-CM

## 2020-02-11 DIAGNOSIS — M488X2 Other specified spondylopathies, cervical region: Secondary | ICD-10-CM | POA: Diagnosis not present

## 2020-02-18 ENCOUNTER — Telehealth: Payer: Self-pay | Admitting: Cardiovascular Disease

## 2020-02-18 ENCOUNTER — Other Ambulatory Visit: Payer: Self-pay | Admitting: Cardiovascular Disease

## 2020-02-18 ENCOUNTER — Ambulatory Visit
Admission: RE | Admit: 2020-02-18 | Discharge: 2020-02-18 | Disposition: A | Discharge status: Home / Self Care | Payer: PPO | Source: Ambulatory Visit | Attending: Cardiovascular Disease | Admitting: Cardiovascular Disease

## 2020-02-18 ENCOUNTER — Other Ambulatory Visit: Payer: Self-pay

## 2020-02-18 ENCOUNTER — Other Ambulatory Visit: Payer: Self-pay | Admitting: Neurosurgery

## 2020-02-18 DIAGNOSIS — I7102 Dissection of abdominal aorta: Secondary | ICD-10-CM | POA: Insufficient documentation

## 2020-02-18 DIAGNOSIS — R110 Nausea: Secondary | ICD-10-CM | POA: Diagnosis not present

## 2020-02-18 DIAGNOSIS — R61 Generalized hyperhidrosis: Secondary | ICD-10-CM | POA: Diagnosis not present

## 2020-02-18 DIAGNOSIS — N8110 Cystocele, unspecified: Secondary | ICD-10-CM | POA: Diagnosis not present

## 2020-02-18 DIAGNOSIS — R109 Unspecified abdominal pain: Secondary | ICD-10-CM

## 2020-02-18 DIAGNOSIS — K5730 Diverticulosis of large intestine without perforation or abscess without bleeding: Secondary | ICD-10-CM | POA: Diagnosis not present

## 2020-02-18 LAB — POCT I-STAT CREATININE: Creatinine, Ser: 0.7 mg/dL (ref 0.44–1.00)

## 2020-02-18 MED ORDER — IOHEXOL 300 MG/ML  SOLN
100.0000 mL | Freq: Once | INTRAMUSCULAR | Status: AC | PRN
  Administered 2020-02-18: 100 mL via INTRAVENOUS

## 2020-02-18 NOTE — Telephone Encounter (Signed)
   [facility] Pre-operative Risk Assessment    HEARTCARE STAFF: - Please ensure there is not already an duplicate clearance open for this procedure. - Under Visit Info/Reason for Call, type in Other and utilize the format Clearance MM/DD/YY or Clearance TBD. Do not use dashes or single digits. - If request is for dental extraction, please clarify the # of teeth to be extracted.  Request for surgical clearance:  1. What type of surgery is being performed? Post cervical fusion   2. When is this surgery scheduled? 04/01/20  3. What type of clearance is required (medical clearance vs. Pharmacy clearance to hold med vs. Both)? Not noted  4. Are there any medications that need to be held prior to surgery and how long? Not noted  5. Practice name and name of physician performing surgery? [facility] Dr. Newman Pies  6. What is the office phone number? [phone] ext [phone]   7.   What is the office fax number? [phone]  8.   Anesthesia type (None, local, MAC, general) ? general   Jessica Wheeler 02/18/2020, 2:52 PM  _________________________________________________________________   (provider comments below)

## 2020-02-18 NOTE — Telephone Encounter (Signed)
   [facility] Pre-operative Risk Assessment    HEARTCARE STAFF: - Please ensure there is not already an duplicate clearance open for this procedure. - Under Visit Info/Reason for Call, type in Other and utilize the format Clearance MM/DD/YY or Clearance TBD. Do not use dashes or single digits. - If request is for dental extraction, please clarify the # of teeth to be extracted.  Request for surgical clearance:  1. What type of surgery is being performed? Corpectomy   2. When is this surgery scheduled? 03/30/20  3. What type of clearance is required (medical clearance vs. Pharmacy clearance to hold med vs. Both)? Not noted  4. Are there any medications that need to be held prior to surgery and how long? Not noted  5. Practice name and name of physician performing surgery? [facility]  6. What is the office phone number? [phone] ext [phone]   7.   What is the office fax number? [phone]  8.   Anesthesia type (None, local, MAC, general) ? general   Jessica Wheeler 02/18/2020, 2:52 PM  _________________________________________________________________   (provider comments below)

## 2020-02-18 NOTE — Telephone Encounter (Signed)
Dr. Mariah Milling, You saw this patient on 02/03/20 following a neck injury. We are now asked to clear her for surgery. She is unable to complete 4.0 METS. Do you recommend any further testing prior to surgery?  CT abdomen pelvis completed today and does not show aortic aneurysm.

## 2020-02-23 NOTE — Telephone Encounter (Signed)
Acceptable risk for surgery No further testing needed Minimal coronary disease by history

## 2020-02-24 ENCOUNTER — Telehealth: Payer: Self-pay | Admitting: *Deleted

## 2020-02-24 NOTE — Telephone Encounter (Signed)
Left voicemail message to call back  

## 2020-02-24 NOTE — Telephone Encounter (Signed)
-----   Message from Antonieta Iba, MD sent at 02/23/2020 10:36 AM EDT ----- Abdominal CT Etiology of abdominal pain unclear There is some aortic atherosclerosis Also large cystocele Will CC primary care so they are aware

## 2020-02-24 NOTE — Telephone Encounter (Signed)
   Primary Cardiologist: Dr. Mariah Milling  Chart reviewed as part of pre-operative protocol coverage. Patient was recently seen by Dr. Mariah Milling on 02/03/2020. Per Dr. Mariah Milling, she is at acceptable risk for surgery without any further cardiovascular testing.   I will route this recommendation to the requesting party via Epic fax function and remove from pre-op pool.  Please call with questions.  Corrin Parker, PA-C 02/24/2020, 7:49 AM

## 2020-02-27 NOTE — Telephone Encounter (Signed)
Left voicemail message to call back for results.  

## 2020-03-02 NOTE — Telephone Encounter (Signed)
Patient returning call with results.

## 2020-03-03 ENCOUNTER — Other Ambulatory Visit: Payer: Self-pay | Admitting: Neurosurgery

## 2020-03-03 NOTE — Telephone Encounter (Addendum)
Spoke with patient and reviewed test results. She verbalized understanding with no further questions at this time. I did mention that Dr. Mariah Milling did forward results to her primary care provider. No further needs.

## 2020-03-05 ENCOUNTER — Ambulatory Visit: Payer: PPO

## 2020-03-05 DIAGNOSIS — E1169 Type 2 diabetes mellitus with other specified complication: Secondary | ICD-10-CM

## 2020-03-05 DIAGNOSIS — I10 Essential (primary) hypertension: Secondary | ICD-10-CM

## 2020-03-12 NOTE — Progress Notes (Signed)
Established patient visit   Patient: Jessica Wheeler   DOB: 03/27/1941   79 y.o. Female  MRN: [id number] Visit Date: 03/13/2020  Today's healthcare provider: Shirlee Latch, MD   Chief Complaint  Patient presents with   Diabetes   Subjective    HPI  Diabetes Mellitus Type II, Follow-up  Lab Results  Component Value Date   HGBA1C 6.9 (A) 03/13/2020   HGBA1C 7.0 (A) 12/12/2019   HGBA1C 7.0 (H) 07/04/2019   Wt Readings from Last 3 Encounters:  03/13/20 140 lb (63.5 kg)  02/03/20 141 lb 4 oz (64.1 kg)  01/17/20 142 lb (64.4 kg)   Last seen for diabetes 6 months ago.  Management since then includes no changes. She reports excellent compliance with treatment. She is not having side effects.  Symptoms: No fatigue No foot ulcerations  No appetite changes No nausea  No paresthesia of the feet  No polydipsia  No polyuria No visual disturbances   No vomiting     Home blood sugar records: fasting range: 120  Episodes of hypoglycemia? No    Current insulin regiment: none Most Recent Eye Exam: will send ROI Current exercise: walking Current diet habits: in general, a "healthy" diet    Pertinent Labs: Lab Results  Component Value Date   CHOL 186 03/15/2019   HDL 89 03/15/2019   LDLCALC 81 03/15/2019   TRIG 88 03/15/2019   CHOLHDL 2.1 03/15/2019   Lab Results  Component Value Date   NA 132 (L) 12/10/2019   K 3.7 12/10/2019   CREATININE 0.70 02/18/2020   GFRNONAA >60 12/10/2019   GFRAA >60 12/10/2019   GLUCOSE 129 (H) 12/10/2019     ---------------------------------------------------------------------------------------------------   Patient Active Problem List   Diagnosis Date Noted   Overactive bladder 01/17/2020   Myalgia 12/13/2019   Hyperlipidemia associated with type 2 diabetes mellitus (HCC) 03/15/2019   Fatigue 12/11/2017   Lower extremity edema 06/29/2017   Primary osteoarthritis of left knee 03/10/2016   Depression 12/28/2015    Allergic rhinitis 01/31/2015   Benign paroxysmal positional nystagmus 01/31/2015   Bladder infection, chronic 01/31/2015   Glaucoma 01/31/2015   Alopecia 01/31/2015   Chronic hyponatremia 01/31/2015   Muscle spasm 01/31/2015   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 01/31/2015   Anxiety 11/04/2014   Muscle weakness 06/24/2014   Fibrocystic breast disease 04/30/2013   Bloodgood disease 04/30/2013   Renal artery stenosis (HCC) 04/18/2012   Chest pain 10/10/2011   Murmur 10/10/2011   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HCC) 03/31/2011   HTN (hypertension) 08/03/2010   CAD (coronary artery disease) 08/03/2010   DM (diabetes mellitus) (HCC) 08/03/2010   Social History   Tobacco Use   Smoking status: Former Smoker    Packs/day: 1.00    Years: 15.00    Pack years: 15.00    Types: Cigarettes    Quit date: 08/03/2003    Years since quitting: 16.6   Smokeless tobacco: Never Used  Vaping Use   Vaping Use: Never used  Substance Use Topics   Alcohol use: No   Drug use: No   Allergies  Allergen Reactions   Beta Adrenergic Blockers     Chest pressure/difficulty breathing   Jardiance [Empagliflozin]     Muscle cramps   Levaquin [Levofloxacin In D5w]     Low blood sugar; does take this when she needs it. She just makes sure to control her BS to avoid hypoglycemia.   Levofloxacin    Lisinopril Cough  Prevnar [Pneumococcal 13-Val Conj Vacc] Rash    Joint Pain   Sulfa Antibiotics Rash    "makes my skin raw"       Medications: Outpatient Medications Prior to Visit  Medication Sig   amiloride-hydrochlorothiazide (MODURETIC) 5-50 MG tablet TAKE 1/2 (ONE-HALF) TABLET BY MOUTH ONCE DAILY AS NEEDED FOR SWELLING (Patient taking differently: TAKE 1/4 TABLET BY MOUTH ONCE DAILY AS NEEDED FOR SWELLING)   amLODipine (NORVASC) 5 MG tablet Take 1 tablet by mouth once daily   aspirin 81 MG EC tablet Take 81 mg by mouth daily.     Cyanocobalamin (VITAMIN B-12)  1000 MCG/15ML LIQD Take 1,000 mcg by mouth daily.    ezetimibe (ZETIA) 10 MG tablet Take 1 tablet (10 mg total) by mouth daily.   Ibuprofen (ADVIL) 200 MG CAPS Take 1 capsule by mouth daily.     latanoprost (XALATAN) 0.005 % ophthalmic solution Reported on 10/12/2015 - both eyes nightly   losartan (COZAAR) 100 MG tablet Take 1 tablet by mouth once daily   MAGNESIUM PO Take 250 mg by mouth daily.    metFORMIN (GLUCOPHAGE) 500 MG tablet TAKE 2 TABLETS BY MOUTH TWICE DAILY WITH MEALS   [DISCONTINUED] oxybutynin (DITROPAN-XL) 5 MG 24 hr tablet Take 1 tablet (5 mg total) by mouth at bedtime. (Patient taking differently: Take 5 mg by mouth daily as needed. )   No facility-administered medications prior to visit.    Review of Systems  Constitutional: Negative.   HENT: Negative.   Eyes: Negative.   Respiratory: Negative.   Cardiovascular: Negative.   Endocrine: Negative.   Genitourinary: Negative.   Musculoskeletal: Negative.   Skin: Negative.   Allergic/Immunologic: Negative.   Neurological: Negative.   Hematological: Negative.     Last metabolic panel Lab Results  Component Value Date   GLUCOSE 129 (H) 12/10/2019   NA 132 (L) 12/10/2019   K 3.7 12/10/2019   CL 96 (L) 12/10/2019   CO2 23 12/10/2019   BUN 17 12/10/2019   CREATININE 0.70 02/18/2020   GFRNONAA >60 12/10/2019   GFRAA >60 12/10/2019   CALCIUM 9.6 12/10/2019   PROT 6.8 03/15/2019   ALBUMIN 4.7 03/15/2019   LABGLOB 2.1 03/15/2019   AGRATIO 2.2 03/15/2019   BILITOT 0.5 03/15/2019   ALKPHOS 50 03/15/2019   AST 32 03/15/2019   ALT 33 (H) 03/15/2019   ANIONGAP 13 12/10/2019   Last lipids Lab Results  Component Value Date   CHOL 186 03/15/2019   HDL 89 03/15/2019   LDLCALC 81 03/15/2019   TRIG 88 03/15/2019   CHOLHDL 2.1 03/15/2019   Last hemoglobin A1c Lab Results  Component Value Date   HGBA1C 6.9 (A) 03/13/2020   Last thyroid functions Lab Results  Component Value Date   TSH 3.880 12/11/2017     Last vitamin D Lab Results  Component Value Date   VD25OH 29.2 (L) 12/11/2017   Last vitamin B12 and Folate Lab Results  Component Value Date   VITAMINB12 1,190 12/11/2017      Objective    BP 129/65 (BP Location: Left Arm, Patient Position: Sitting, Cuff Size: Normal)    Pulse 83    Temp 98.2 F (36.8 C) (Oral)    Resp 16    Wt 140 lb (63.5 kg)    BMI 24.80 kg/m  BP Readings from Last 3 Encounters:  03/13/20 129/65  02/03/20 (!) 148/60  01/17/20 (!) 145/56   Wt Readings from Last 3 Encounters:  03/13/20 140 lb (63.5 kg)  02/03/20 141 lb 4 oz (64.1 kg)  01/17/20 142 lb (64.4 kg)      Physical Exam Vitals reviewed.  Constitutional:      General: She is not in acute distress.    Appearance: Normal appearance. She is well-developed. She is not diaphoretic.  HENT:     Head: Normocephalic and atraumatic.  Eyes:     General: No scleral icterus.    Conjunctiva/sclera: Conjunctivae normal.  Neck:     Thyroid: No thyromegaly.  Cardiovascular:     Rate and Rhythm: Normal rate and regular rhythm.     Pulses: Normal pulses.     Heart sounds: Normal heart sounds. No murmur heard.   Pulmonary:     Effort: Pulmonary effort is normal. No respiratory distress.     Breath sounds: Normal breath sounds. No wheezing, rhonchi or rales.  Musculoskeletal:     Cervical back: Neck supple.     Right lower leg: No edema.     Left lower leg: No edema.  Lymphadenopathy:     Cervical: No cervical adenopathy.  Skin:    General: Skin is warm and dry.     Findings: No rash.  Neurological:     Mental Status: She is alert and oriented to person, place, and time. Mental status is at baseline.  Psychiatric:        Mood and Affect: Mood normal.        Behavior: Behavior normal.       Results for orders placed or performed in visit on 03/13/20  POCT glycosylated hemoglobin (Hb A1C)  Result Value Ref Range   Hemoglobin A1C 6.9 (A) 4.0 - 5.6 %   Est. average glucose Bld gHb Est-mCnc  151     Assessment & Plan     Problem List Items Addressed This Visit      Cardiovascular and Mediastinum   HTN (hypertension)    Well controlled Continue current medications Recheck metabolic panel F/u in 6 months       Relevant Orders   Comprehensive metabolic panel     Endocrine   DM (diabetes mellitus) (HCC) - Primary    Well controlled with A1 6.9 Continue current medications UTD on vaccines, eye exam, foot exam On ACEi Not on statin Discussed diet and exercise F/u in 6 months       Relevant Orders   POCT glycosylated hemoglobin (Hb A1C) (Completed)   Hyperlipidemia associated with type 2 diabetes mellitus (HCC)    Now on Zetia Not on statin due to hesitance related to possible myalgias Recheck FLP and CMP      Relevant Orders   Comprehensive metabolic panel   Lipid panel    Other Visit Diagnoses    Pre-op testing       Relevant Orders   Comprehensive metabolic panel   CBC   INR/PT     -upcoming neurosurgery for neck fracture - will check labs today - can sign clearance form if needed   Return in about 4 months (around 07/11/2020) for CPE, AWV.      I, Shirlee Latch, MD, have reviewed all documentation for this visit. The documentation on 03/13/20 for the exam, diagnosis, procedures, and orders are all accurate and complete.   Denielle Bayard, Marzella Schlein, MD, MPH [facility]

## 2020-03-12 NOTE — Patient Instructions (Signed)

## 2020-03-13 ENCOUNTER — Ambulatory Visit (INDEPENDENT_AMBULATORY_CARE_PROVIDER_SITE_OTHER): Payer: PPO | Admitting: Family Medicine

## 2020-03-13 ENCOUNTER — Encounter: Payer: Self-pay | Admitting: Family Medicine

## 2020-03-13 ENCOUNTER — Other Ambulatory Visit: Payer: Self-pay

## 2020-03-13 VITALS — BP 129/65 | HR 83 | Temp 98.2°F | Resp 16 | Wt 140.0 lb

## 2020-03-13 DIAGNOSIS — E1169 Type 2 diabetes mellitus with other specified complication: Secondary | ICD-10-CM

## 2020-03-13 DIAGNOSIS — E785 Hyperlipidemia, unspecified: Secondary | ICD-10-CM | POA: Diagnosis not present

## 2020-03-13 DIAGNOSIS — I10 Essential (primary) hypertension: Secondary | ICD-10-CM

## 2020-03-13 DIAGNOSIS — Z01818 Encounter for other preprocedural examination: Secondary | ICD-10-CM

## 2020-03-13 LAB — POCT GLYCOSYLATED HEMOGLOBIN (HGB A1C)
Est. average glucose Bld gHb Est-mCnc: 151
Hemoglobin A1C: 6.9 % — AB (ref 4.0–5.6)

## 2020-03-13 NOTE — Assessment & Plan Note (Signed)
Now on Zetia Not on statin due to hesitance related to possible myalgias Recheck FLP and CMP

## 2020-03-13 NOTE — Assessment & Plan Note (Signed)
Well controlled with A1 6.9 Continue current medications UTD on vaccines, eye exam, foot exam On ACEi Not on statin Discussed diet and exercise F/u in 6 months

## 2020-03-13 NOTE — Assessment & Plan Note (Signed)
Well controlled Continue current medications Recheck metabolic panel F/u in 6 months  

## 2020-03-14 LAB — COMPREHENSIVE METABOLIC PANEL
ALT: 29 IU/L (ref 0–32)
AST: 26 IU/L (ref 0–40)
Albumin/Globulin Ratio: 1.8 (ref 1.2–2.2)
Albumin: 4.4 g/dL (ref 3.6–4.6)
Alkaline Phosphatase: 47 IU/L (ref 44–121)
BUN/Creatinine Ratio: 19 (ref 12–28)
BUN: 15 mg/dL (ref 8–27)
Bilirubin Total: 0.5 mg/dL (ref 0.0–1.2)
CO2: 22 mmol/L (ref 20–29)
Calcium: 9.7 mg/dL (ref 8.7–10.3)
Chloride: 97 mmol/L (ref 96–106)
Creatinine, Ser: 0.78 mg/dL (ref 0.57–1.00)
GFR calc Af Amer: 82 mL/min/{1.73_m2} (ref >59)
GFR calc non Af Amer: 72 mL/min/{1.73_m2} (ref >59)
Globulin, Total: 2.5 g/dL (ref 1.5–4.5)
Glucose: 138 mg/dL — ABNORMAL HIGH (ref 65–99)
Potassium: 3.9 mmol/L (ref 3.5–5.2)
Sodium: 135 mmol/L (ref 134–144)
Total Protein: 6.9 g/dL (ref 6.0–8.5)

## 2020-03-14 LAB — HEMOGLOBIN A1C
Est. average glucose Bld gHb Est-mCnc: 154 mg/dL
Hgb A1c MFr Bld: 7 % — ABNORMAL HIGH (ref 4.8–5.6)

## 2020-03-14 LAB — LIPID PANEL
Chol/HDL Ratio: 2.3 ratio (ref 0.0–4.4)
Cholesterol, Total: 190 mg/dL (ref 100–199)
HDL: 84 mg/dL (ref >39)
LDL Chol Calc (NIH): 89 mg/dL (ref 0–99)
Triglycerides: 95 mg/dL (ref 0–149)
VLDL Cholesterol Cal: 17 mg/dL (ref 5–40)

## 2020-03-14 LAB — CBC
Hematocrit: 37.5 % (ref 34.0–46.6)
Hemoglobin: 13 g/dL (ref 11.1–15.9)
MCH: 31.6 pg (ref 26.6–33.0)
MCHC: 34.7 g/dL (ref 31.5–35.7)
MCV: 91 fL (ref 79–97)
Platelets: 254 10*3/uL (ref 150–450)
RBC: 4.12 x10E6/uL (ref 3.77–5.28)
RDW: 12.8 % (ref 11.7–15.4)
WBC: 5.9 10*3/uL (ref 3.4–10.8)

## 2020-03-14 LAB — PROTIME-INR
INR: 1 (ref 0.9–1.2)
Prothrombin Time: 10.6 s (ref 9.1–12.0)

## 2020-03-16 ENCOUNTER — Other Ambulatory Visit: Payer: Self-pay | Admitting: Neurosurgery

## 2020-03-16 NOTE — Patient Instructions (Signed)
Thank you for allowing the Chronic Care Management team to participate in your care.  Goals Addressed            This Visit's Progress   . Chronic Disease Management       CARE PLAN ENTRY (see longitudinal plan of care for additional care plan information)  Current Barriers:  . Chronic Disease Management support and education needs related to HTN, CAD, DM and Hyperlipidemia. (Recent C-Spine Injury)  Case Manager Clinical Goal(s):  Marland Kitchen Over the next 120 days, patient will not require hospitalization or emergent care d/t complications r/t chronic illnesses. . Over the next 90 days, patient will take all medications as prescribed. . Over the next 90 days, patient will attend all appointments as scheduled. . Over the next 90 days, patient will monitor blood pressure and record readings. . Over the next 90 days, patient will adhere to recommended cardiac prudent/diabetic diet. . Over the next 90 days, patient will follow recommended safety measures to prevent falls and injuries. . Over the next 60 days, patient will adhere to treatment plan outlined by the Orthopedic surgical team. . Over the next 30 days, patient will update care management team if in-home assistance is needed.   Interventions:  . Inter-disciplinary care team collaboration (see longitudinal plan of care) . Reviewed medications, home BP and blood glucose monitoring. Continues to take medications as prescribed and monitoring BP and FBS as recommended. Reports FBS readings in the 120's. Denies s/sx r/t hypoglycemia or hyperglycemia. Noted some slightly elevated systolic BP readings in the 140's but reports most readings have been within range. No chest pain, palpitations, dizziness or headaches.   . Discussed current activity tolerance. Reviewed safety and fall prevention measures. Reports being very active and following the recommended activity restrictions. Reports regularly walking 2 to 2 1/5 miles without difficulty. Denies  falls or changes in activity tolerance.   . Discussed pending procedures with the Neurosurgery team and plans for care management follow up. Pending Neurosurgical procedures on 03/30/20 and 04/01/20. We discussed possible need for in-home assistance after discharge. She prefers to receive Home Health services versus  transitioning to an rehabilitation facility. She plans to discuss this with the Neurosurgery team. She reports having very good support. Anticipates family and neighbors being available to assist if needed. She does not anticipate a need for additional in-home assistance but agreed to update the care management team if this changes.   Patient Self Care Activities:  . Self administers medications  . Attends scheduled provider appointments . Calls pharmacy for medication refills . Performs ADL's independently   Please see past updates related to this goal by clicking on the "Past Updates" button in the selected goal        Jessica Wheeler verbalized understanding of the information discussed during the telephonic outreach today. Declined need for a printed/mailed copy of the information.   A member of the care management team will follow-up with Jessica Wheeler next month.   [facility] Care Management [facility] [phone]

## 2020-03-16 NOTE — Chronic Care Management (AMB) (Signed)
Chronic Care Management   Follow Up Note    Name: Jessica Wheeler MRN: [id number] DOB: 06-05-1940  Primary Care Provider: Erasmo Downer, MD Reason for referral : Chronic Care Management   ATOYA ANDREW is a 79 y.o. year old female who is a primary care patient of Beryle Flock, Marzella Schlein, MD. She is currently engaged with the chronic care management team. A routine telephonic outreach was conducted today.   Review of Mrs. Iwai's status, including review of consultants reports, relevant labs and test results was conducted today. Collaboration with appropriate care team members was performed as part of the comprehensive evaluation and provision of chronic care management services.    SDOH (Social Determinants of Health) assessments performed: No     Outpatient Encounter Medications as of 03/05/2020  Medication Sig  . amiloride-hydrochlorothiazide (MODURETIC) 5-50 MG tablet TAKE 1/2 (ONE-HALF) TABLET BY MOUTH ONCE DAILY AS NEEDED FOR SWELLING (Patient taking differently: TAKE 1/4 TABLET BY MOUTH ONCE DAILY AS NEEDED FOR SWELLING)  . amLODipine (NORVASC) 5 MG tablet Take 1 tablet by mouth once daily  . aspirin 81 MG EC tablet Take 81 mg by mouth daily.    . Cyanocobalamin (VITAMIN B-12) 1000 MCG/15ML LIQD Take 1,000 mcg by mouth daily.   Marland Kitchen ezetimibe (ZETIA) 10 MG tablet Take 1 tablet (10 mg total) by mouth daily.  . Ibuprofen (ADVIL) 200 MG CAPS Take 1 capsule by mouth daily.    Marland Kitchen latanoprost (XALATAN) 0.005 % ophthalmic solution Reported on 10/12/2015 - both eyes nightly  . losartan (COZAAR) 100 MG tablet Take 1 tablet by mouth once daily  . MAGNESIUM PO Take 250 mg by mouth daily.   . metFORMIN (GLUCOPHAGE) 500 MG tablet TAKE 2 TABLETS BY MOUTH TWICE DAILY WITH MEALS  . [DISCONTINUED] oxybutynin (DITROPAN-XL) 5 MG 24 hr tablet Take 1 tablet (5 mg total) by mouth at bedtime. (Patient taking differently: Take 5 mg by mouth daily as needed. )   No facility-administered encounter  medications on file as of 03/05/2020.     Goals Addressed            This Visit's Progress   . Chronic Disease Management       CARE PLAN ENTRY (see longitudinal plan of care for additional care plan information)  Current Barriers:  . Chronic Disease Management support and education needs related to HTN, CAD, DM and Hyperlipidemia. (Recent C-Spine Injury)  Case Manager Clinical Goal(s):  Marland Kitchen Over the next 120 days, patient will not require hospitalization or emergent care d/t complications r/t chronic illnesses. . Over the next 90 days, patient will take all medications as prescribed. . Over the next 90 days, patient will attend all appointments as scheduled. . Over the next 90 days, patient will monitor blood pressure and record readings. . Over the next 90 days, patient will adhere to recommended cardiac prudent/diabetic diet. . Over the next 90 days, patient will follow recommended safety measures to prevent falls and injuries. . Over the next 60 days, patient will adhere to treatment plan outlined by the Orthopedic surgical team. . Over the next 30 days, patient will update care management team if in-home assistance is needed.   Interventions:  . Inter-disciplinary care team collaboration (see longitudinal plan of care) . Reviewed medications, home BP and blood glucose monitoring. Continues to take medications as prescribed and monitoring BP and FBS as recommended. Reports FBS readings in the 120's. Denies s/sx r/t hypoglycemia or hyperglycemia. Noted some slightly elevated systolic BP readings  in the 140's but reports most readings have been within range. No chest pain, palpitations, dizziness or headaches.   . Discussed current activity tolerance. Reviewed safety and fall prevention measures. Reports being very active and following the recommended activity restrictions. Reports regularly walking 2 to 2 1/5 miles without difficulty. Denies falls or changes in activity tolerance.    . Discussed pending procedures with the Neurosurgery team and plans for care management follow up. Pending Neurosurgical procedures on 03/30/20 and 04/01/20. We discussed possible need for in-home assistance after discharge. She prefers to receive Home Health services versus  transitioning to a rehabilitation facility. She plans to discuss this with the Neurosurgery team. She reports having very good support. Anticipates family and neighbors being available to assist if needed. She does not anticipate a need for additional in-home assistance but agreed to update the care management team if this changes.   Patient Self Care Activities:  . Self administers medications  . Attends scheduled provider appointments . Calls pharmacy for medication refills . Performs ADL's independently   Please see past updates related to this goal by clicking on the "Past Updates" button in the selected goal          PLAN A member of the care management team will follow-up with Mrs. Dickmann next month.   [facility] Care Management [facility] [phone]

## 2020-03-23 NOTE — Pre-Procedure Instructions (Signed)
[facility] [address] ([address]), [place] - [address]) [place] [address] Phone: [phone] Fax: [phone]      Your procedure is scheduled on Monday, November 29th at 7:30a.m.  Report to [facility] Main Entrance "A" at 5:30 A.M., and check in at the Admitting office.  Call this number if you have problems the morning of surgery:  [phone]  Call [phone] if you have any questions prior to your surgery date Monday-Friday 8am-4pm    Remember:  Do not eat or drink after midnight the night before your surgery    Take these medicines the morning of surgery with A SIP OF WATER  amLODipine (NORVASC)  Follow your surgeon's instructions on when to stop Aspirin.  If no instructions were given by your surgeon then you will need to call the office to get those instructions.    As of today, STOP taking any Aspirin (unless otherwise instructed by your surgeon) Aleve, Naproxen, Ibuprofen, Motrin, Advil, Goody's, BC's, all herbal medications, fish oil, and all vitamins.   WHAT DO I DO ABOUT MY DIABETES MEDICATION?    Do not take metFORMIN (GLUCOPHAGE) the morning of surgery.   HOW TO MANAGE YOUR DIABETES BEFORE AND AFTER SURGERY  Why is it important to control my blood sugar before and after surgery?  Improving blood sugar levels before and after surgery helps healing and can limit problems.  A way of improving blood sugar control is eating a healthy diet by: o  Eating less sugar and carbohydrates o  Increasing activity/exercise o  Talking with your doctor about reaching your blood sugar goals  High blood sugars (greater than 180 mg/dL) can raise your risk of infections and slow your recovery, so you will need to focus on controlling your diabetes during the weeks before surgery.  Make sure that the doctor who takes care of your diabetes knows about your planned surgery including the date and location.  How do I  manage my blood sugar before surgery?  Check your blood sugar at least 4 times a day, starting 2 days before surgery, to make sure that the level is not too high or low.  Check your blood sugar the morning of your surgery when you wake up and every 2 hours until you get to the Short Stay unit. o If your blood sugar is less than 70 mg/dL, you will need to treat for low blood sugar: - Do not take insulin. - Treat a low blood sugar (less than 70 mg/dL) with  cup of clear juice (cranberry or apple), 4 glucose tablets, OR glucose gel. - Recheck blood sugar in 15 minutes after treatment (to make sure it is greater than 70 mg/dL). If your blood sugar is not greater than 70 mg/dL on recheck, call [phone] for further instructions.  Report your blood sugar to the short stay nurse when you get to Short Stay.   If you are admitted to the hospital after surgery: o Your blood sugar will be checked by the staff and you will probably be given insulin after surgery (instead of oral diabetes medicines) to make sure you have good blood sugar levels. o The goal for blood sugar control after surgery is 80-180 mg/dL.                      Do not wear jewelry, make up, or nail polish.  Do not wear lotions, powders, perfumes, or deodorant.            Do not shave 48 hours prior to surgery.              Do not bring valuables to the hospital.            [facility] is not responsible for any belongings or valuables.  Do NOT Smoke (Tobacco/Vaping) or drink Alcohol 24 hours prior to your procedure If you use a CPAP at night, you may bring all equipment for your overnight stay.   Contacts, glasses, dentures or bridgework may not be worn into surgery.      For patients admitted to the hospital, discharge time will be determined by your treatment team.   Patients discharged the day of surgery will not be allowed to drive home, and someone needs to stay with them for 24 hours.    Special  instructions:   [facility]- Preparing For Surgery  Before surgery, you can play an important role. Because skin is not sterile, your skin needs to be as free of germs as possible. You can reduce the number of germs on your skin by washing with CHG (chlorahexidine gluconate) Soap before surgery.  CHG is an antiseptic cleaner which kills germs and bonds with the skin to continue killing germs even after washing.    Oral Hygiene is also important to reduce your risk of infection.  Remember - BRUSH YOUR TEETH THE MORNING OF SURGERY WITH YOUR REGULAR TOOTHPASTE  Please do not use if you have an allergy to CHG or antibacterial soaps. If your skin becomes reddened/irritated stop using the CHG.  Do not shave (including legs and underarms) for at least 48 hours prior to first CHG shower. It is OK to shave your face.  Please follow these instructions carefully.   1. Shower the NIGHT BEFORE SURGERY and the MORNING OF SURGERY with CHG Soap.   2. If you chose to wash your hair, wash your hair first as usual with your normal shampoo.  3. After you shampoo, rinse your hair and body thoroughly to remove the shampoo.  4. Use CHG as you would any other liquid soap. You can apply CHG directly to the skin and wash gently with a scrungie or a clean washcloth.   5. Apply the CHG Soap to your body ONLY FROM THE NECK DOWN.  Do not use on open wounds or open sores. Avoid contact with your eyes, ears, mouth and genitals (private parts). Wash Face and genitals (private parts)  with your normal soap.   6. Wash thoroughly, paying special attention to the area where your surgery will be performed.  7. Thoroughly rinse your body with warm water from the neck down.  8. DO NOT shower/wash with your normal soap after using and rinsing off the CHG Soap.  9. Pat yourself dry with a CLEAN TOWEL.  10. Wear CLEAN PAJAMAS to bed the night before surgery  11. Place CLEAN SHEETS on your bed the night of your first shower and  DO NOT SLEEP WITH PETS.   Day of Surgery: Wear Clean/Comfortable clothing the morning of surgery Do not apply any deodorants/lotions.   Remember to brush your teeth WITH YOUR REGULAR TOOTHPASTE.   Please read over the following fact sheets that you were given.

## 2020-03-24 ENCOUNTER — Encounter (HOSPITAL_COMMUNITY)
Admission: RE | Admit: 2020-03-24 | Discharge: 2020-03-24 | Disposition: A | Discharge status: Home / Self Care | Payer: PPO | Source: Ambulatory Visit | Attending: Neurosurgery | Admitting: Neurosurgery

## 2020-03-24 ENCOUNTER — Encounter (HOSPITAL_COMMUNITY): Payer: Self-pay

## 2020-03-24 ENCOUNTER — Other Ambulatory Visit: Payer: Self-pay

## 2020-03-24 DIAGNOSIS — M4722 Other spondylosis with radiculopathy, cervical region: Secondary | ICD-10-CM | POA: Insufficient documentation

## 2020-03-24 DIAGNOSIS — Z7984 Long term (current) use of oral hypoglycemic drugs: Secondary | ICD-10-CM | POA: Diagnosis not present

## 2020-03-24 DIAGNOSIS — I7100 Dissection of unspecified site of aorta: Secondary | ICD-10-CM | POA: Diagnosis not present

## 2020-03-24 DIAGNOSIS — Z8719 Personal history of other diseases of the digestive system: Secondary | ICD-10-CM | POA: Diagnosis not present

## 2020-03-24 DIAGNOSIS — I10 Essential (primary) hypertension: Secondary | ICD-10-CM | POA: Insufficient documentation

## 2020-03-24 DIAGNOSIS — R011 Cardiac murmur, unspecified: Secondary | ICD-10-CM | POA: Insufficient documentation

## 2020-03-24 DIAGNOSIS — Z7982 Long term (current) use of aspirin: Secondary | ICD-10-CM | POA: Diagnosis not present

## 2020-03-24 DIAGNOSIS — Z79899 Other long term (current) drug therapy: Secondary | ICD-10-CM | POA: Diagnosis not present

## 2020-03-24 DIAGNOSIS — Z01812 Encounter for preprocedural laboratory examination: Secondary | ICD-10-CM | POA: Diagnosis not present

## 2020-03-24 DIAGNOSIS — E119 Type 2 diabetes mellitus without complications: Secondary | ICD-10-CM | POA: Diagnosis not present

## 2020-03-24 DIAGNOSIS — M4712 Other spondylosis with myelopathy, cervical region: Secondary | ICD-10-CM | POA: Diagnosis not present

## 2020-03-24 DIAGNOSIS — I4510 Unspecified right bundle-branch block: Secondary | ICD-10-CM | POA: Insufficient documentation

## 2020-03-24 LAB — BASIC METABOLIC PANEL
Anion gap: 12 (ref 5–15)
BUN: 13 mg/dL (ref 8–23)
CO2: 26 mmol/L (ref 22–32)
Calcium: 10 mg/dL (ref 8.9–10.3)
Chloride: 93 mmol/L — ABNORMAL LOW (ref 98–111)
Creatinine, Ser: 0.81 mg/dL (ref 0.44–1.00)
GFR, Estimated: >60 mL/min (ref >60)
Glucose, Bld: 168 mg/dL — ABNORMAL HIGH (ref 70–99)
Potassium: 4.2 mmol/L (ref 3.5–5.1)
Sodium: 131 mmol/L — ABNORMAL LOW (ref 135–145)

## 2020-03-24 LAB — SURGICAL PCR SCREEN
MRSA, PCR: NEGATIVE
Staphylococcus aureus: NEGATIVE

## 2020-03-24 LAB — TYPE AND SCREEN
ABO/RH(D): A POS
Antibody Screen: NEGATIVE

## 2020-03-24 LAB — CBC
HCT: 39.8 % (ref 36.0–46.0)
Hemoglobin: 13.4 g/dL (ref 12.0–15.0)
MCH: 30.7 pg (ref 26.0–34.0)
MCHC: 33.7 g/dL (ref 30.0–36.0)
MCV: 91.1 fL (ref 80.0–100.0)
Platelets: 304 10*3/uL (ref 150–400)
RBC: 4.37 MIL/uL (ref 3.87–5.11)
RDW: 13 % (ref 11.5–15.5)
WBC: 6.4 10*3/uL (ref 4.0–10.5)
nRBC: 0 % (ref 0.0–0.2)

## 2020-03-24 LAB — GLUCOSE, CAPILLARY: Glucose-Capillary: 177 mg/dL — ABNORMAL HIGH (ref 70–99)

## 2020-03-24 MED ORDER — CHLORHEXIDINE GLUCONATE CLOTH 2 % EX PADS: 6.0000 | MEDICATED_PAD | Freq: Once | CUTANEOUS | Status: DC

## 2020-03-24 NOTE — Progress Notes (Signed)
Anesthesia Chart Review:  Case: [id number] Date/Time: 03/30/20 0715   Procedure: CORPECTOMY CERVICAL 4 - CERVICAL 5 (N/A )   Anesthesia type: General   Pre-op diagnosis: CERVICAL SPONDYLOSIS WITH MYELOPATHY AND RADICULOPATHY   [facility]: [facility]OM [facility] / [facility]   Surgeons: Tressie Stalker, MD      DISCUSSION: Patient is an 79 year old female scheduled for the above procedure.  History includes former smoker (quit 08/03/03), DM2, HTN, aortic dissection (2 cm weblike area within the distal aorta likely representing dissection 06/05/08 CTA, "healed" by follow-up imaging), pancreatitis (2005), right BBB, murmur (no significant valvular disease 2013 echo), glaucoma (2005)  Preoperative cardiology input outlined on 02/24/2020 by Marjie Skiff, PA-C: "Per Dr. Mariah Milling, she is at acceptable risk for surgery without any further cardiovascular testing."  Presurgical COVID-19 test is scheduled for 03/27/2020.  Anesthesia team to evaluate on the day of surgery.   VS: BP (!) 158/69   Pulse 91   Temp 36.7 C (Oral)   Resp 18   Ht 5\' 3"  (1.6 m)   Wt 63.7 kg   SpO2 98%   BMI 24.89 kg/m    PROVIDERS: , MD his PCP. Last visit 03/13/20. DM and HTN well controlled. 13/12/21, MD is cardiologist. Last visit 02/03/20.  04/04/20, MD is vascular surgeon. Last visit 12/12/17 for follow-up mild aortoiliac atherosclerosis. He notes previous abdominal aortic dissection "seems to have healed. No significant aneurysmal degeneration or malperfusion at this point." Two year follow-up recommended.    LABS: Labs reviewed: Acceptable for surgery. A1c 7.0% on 03/13/20.  (all labs ordered are listed, but only abnormal results are displayed)  Labs Reviewed  GLUCOSE, CAPILLARY - Abnormal; Notable for the following components:      Result Value   Glucose-Capillary 177 (*)    All other components within normal limits  BASIC METABOLIC PANEL - Abnormal; Notable for the following components:    Sodium 131 (*)    Chloride 93 (*)    Glucose, Bld 168 (*)    All other components within normal limits  SURGICAL PCR SCREEN  CBC  TYPE AND SCREEN     IMAGES: CTabd/pelvis 02/18/20: IMPRESSION: - No acute findings within the abdomen or pelvis. - Colonic diverticulosis, without radiographic evidence of diverticulitis. - Pelvic floor laxity with large cystocele. - Aortic Atherosclerosis (ICD10-I70.0).   EKG: 02/03/2020 ([facility]): Sinus rhythm with premature supraventricular complexes Right bundle branch block   CV: Echo 11/01/11: Study Conclusions  Left ventricle: The cavity size was normal. Wall thickness  was normal. Systolic function was normal. The estimated  ejection fraction was in the range of 65% to 70%. Wall  motion was normal; there were no regional wall motion  abnormalities. Doppler parameters are consistent with  abnormal left ventricular relaxation (grade 1 diastolic  dysfunction).  Trivial mitral regurgitation.  Trivial pulmonic regurgitation.       Nuclear stress test 10/11/11: IMPRESSION:     Normal study.  1.No evidence of ischemia or infarct.  2.Normal LV systolic function with an ejection fraction greater than 70%.  3.Negative treadmill ECG test with below average exercise capacity. This  is  an overall low risk scan.    Cardiac cath 06/08/05 [facility]): Per Handwritten report scanned into Results Review Tab Right dominance.  LM: Normal LAD: 20% mid LCX: Insignificant RCA: 20%, _______ spasm with injection ____: Normal Medical Therapy.   Past Medical History:  Diagnosis Date  . Allergy   . Arthritis   . Bundle  branch block, right   . Diabetes mellitus   . Diffuse cystic mastopathy   . Dissection, aorta (HCC)    Dr.Dew follows  . Diverticulosis   . Family history of malignant neoplasm of gastrointestinal tract   . Glaucoma 2005  . Heart murmur   . History of bronchitis   . History of pancreatitis 2005  . Hypertension 1982  .  Personal history of tobacco use, presenting hazards to health   . RBBB   . Ulcer     Past Surgical History:  Procedure Laterality Date  . ABDOMINAL HYSTERECTOMY  1991  . APPENDECTOMY    . BACK SURGERY  1982   for lumbar ruptured disc. No hardware, no discectomy, no fusion  . BLADDER SURGERY    . BREAST BIOPSY Right 1980's   benign  . BREAST EXCISIONAL BIOPSY Right 1980s   benign  . BREAST SURGERY     biopsy  . CARDIAC CATHETERIZATION     Dr. Lady Gary, few years ago  . CATARACT EXTRACTION W/PHACO Left 08/31/2015   Procedure: CATARACT EXTRACTION PHACO AND INTRAOCULAR LENS PLACEMENT (IOC);  Surgeon: Sherald Hess, MD;  Location: [facility];  Service: Ophthalmology;  Laterality: Left;  DIABETIC - oral meds  . CATARACT EXTRACTION W/PHACO Right 10/12/2015   Procedure: CATARACT EXTRACTION PHACO AND INTRAOCULAR LENS PLACEMENT (IOC) right eye;  Surgeon: Sherald Hess, MD;  Location: [facility];  Service: Ophthalmology;  Laterality: Right;  DIABETIC - oral meds  . CHOLECYSTECTOMY  1994  . COLONOSCOPY  2008,02/22/2012   Dr. Sankar-2013  . COLONOSCOPY WITH PROPOFOL N/A 04/04/2017   Procedure: COLONOSCOPY WITH PROPOFOL;  Surgeon: [name], MD;  Location: [facility];  Service: Endoscopy;  Laterality: N/A;  . REFRACTIVE SURGERY      MEDICATIONS: . baclofen (LIORESAL) 10 MG tablet  . amiloride-hydrochlorothiazide (MODURETIC) 5-50 MG tablet  . amLODipine (NORVASC) 5 MG tablet  . aspirin 81 MG EC tablet  . Cyanocobalamin (VITAMIN B-12) 1000 MCG/15ML LIQD  . ezetimibe (ZETIA) 10 MG tablet  . Ibuprofen (ADVIL) 200 MG CAPS  . latanoprost (XALATAN) 0.005 % ophthalmic solution  . losartan (COZAAR) 100 MG tablet  . Magnesium 250 MG TABS  . metFORMIN (GLUCOPHAGE) 500 MG tablet   . Chlorhexidine Gluconate Cloth 2 % PADS 6 each   And  . Chlorhexidine Gluconate Cloth 2 % PADS 6 each  . Chlorhexidine Gluconate Cloth 2 % PADS 6 each   And  .  Chlorhexidine Gluconate Cloth 2 % PADS 6 each   ASA on hold for surgery.    Shonna Chock, PA-C Surgical Short Stay/Anesthesiology [facility] Phone [phone] [facility] Phone [phone] 03/25/2020 9:49 AM

## 2020-03-24 NOTE — Progress Notes (Signed)
PCP - a. bacigalupo  In [place] Cardiologist - DR Mar Daring    CLEARANCE DONE   Chest x-ray - 8/21 EKG - 10/21 Stress Test - 2013 ECHO - 2013 Cardiac Cath - 2007   Fasting Blood Sugar - 118 Checks Blood Sugar ___2__ times a day  Aspirin Instructions:STOP  COVID TEST- FOR FRIDAY   Anesthesia review: REVIEW HTN      Patient denies shortness of breath, fever, cough and chest pain at PAT appointment   All instructions explained to the patient, with a verbal understanding of the material. Patient agrees to go over the instructions while at home for a better understanding. Patient also instructed to self quarantine after being tested for COVID-19. The opportunity to ask questions was provided.

## 2020-03-25 NOTE — Anesthesia Preprocedure Evaluation (Addendum)
Anesthesia Evaluation  Patient identified by MRN, date of birth, ID band Patient awake    Reviewed: Allergy & Precautions, NPO status , Patient's Chart, lab work & pertinent test results  Airway Mallampati: II  TM Distance: >3 FB     Dental   Pulmonary former smoker,    breath sounds clear to auscultation       Cardiovascular hypertension, + CAD and + Peripheral Vascular Disease  + dysrhythmias + Valvular Problems/Murmurs  Rhythm:Regular Rate:Normal     Neuro/Psych Anxiety Depression    GI/Hepatic negative GI ROS, Neg liver ROS,   Endo/Other  diabetes  Renal/GU      Musculoskeletal   Abdominal   Peds  Hematology   Anesthesia Other Findings   Reproductive/Obstetrics                            Anesthesia Physical Anesthesia Plan  ASA: III  Anesthesia Plan: General   Post-op Pain Management:    Induction: Intravenous  PONV Risk Score and Plan: 3 and Ondansetron, Dexamethasone and Midazolam  Airway Management Planned: Oral ETT  Additional Equipment: Arterial line  Intra-op Plan:   Post-operative Plan:   Informed Consent: I have reviewed the patients History and Physical, chart, labs and discussed the procedure including the risks, benefits and alternatives for the proposed anesthesia with the patient or authorized representative who has indicated his/her understanding and acceptance.     Dental advisory given  Plan Discussed with: Anesthesiologist and CRNA  Anesthesia Plan Comments: (PAT note written 03/25/2020 by Shonna Chock, PA-C. )     Anesthesia Quick Evaluation

## 2020-03-27 ENCOUNTER — Other Ambulatory Visit
Admission: RE | Admit: 2020-03-27 | Discharge: 2020-03-27 | Disposition: A | Discharge status: Home / Self Care | Payer: PPO | Source: Ambulatory Visit | Attending: Neurosurgery | Admitting: Neurosurgery

## 2020-03-27 ENCOUNTER — Other Ambulatory Visit: Payer: Self-pay

## 2020-03-27 DIAGNOSIS — Z01812 Encounter for preprocedural laboratory examination: Secondary | ICD-10-CM | POA: Insufficient documentation

## 2020-03-27 DIAGNOSIS — Z20822 Contact with and (suspected) exposure to covid-19: Secondary | ICD-10-CM | POA: Diagnosis not present

## 2020-03-28 LAB — SARS CORONAVIRUS 2 (TAT 6-24 HRS): SARS Coronavirus 2: NEGATIVE

## 2020-03-30 ENCOUNTER — Encounter (HOSPITAL_COMMUNITY): Payer: Self-pay | Admitting: Neurosurgery

## 2020-03-30 ENCOUNTER — Inpatient Hospital Stay (HOSPITAL_COMMUNITY)
Admission: RE | Disposition: A | Discharge status: Home Health | Payer: Self-pay | Source: Home / Self Care | Attending: Neurosurgery

## 2020-03-30 ENCOUNTER — Inpatient Hospital Stay (HOSPITAL_COMMUNITY): Payer: PPO | Admitting: Anesthesiology

## 2020-03-30 ENCOUNTER — Inpatient Hospital Stay (HOSPITAL_COMMUNITY): Payer: PPO

## 2020-03-30 ENCOUNTER — Inpatient Hospital Stay (HOSPITAL_COMMUNITY)
Admission: RE | Admit: 2020-03-30 | Discharge: 2020-04-03 | DRG: 454 | Disposition: A | Discharge status: Home Health | Payer: PPO | Attending: Neurosurgery | Admitting: Neurosurgery

## 2020-03-30 ENCOUNTER — Inpatient Hospital Stay (HOSPITAL_COMMUNITY): Payer: PPO | Admitting: Vascular Surgery

## 2020-03-30 ENCOUNTER — Other Ambulatory Visit: Payer: Self-pay

## 2020-03-30 DIAGNOSIS — Z87891 Personal history of nicotine dependence: Secondary | ICD-10-CM

## 2020-03-30 DIAGNOSIS — Z7982 Long term (current) use of aspirin: Secondary | ICD-10-CM | POA: Diagnosis not present

## 2020-03-30 DIAGNOSIS — Z882 Allergy status to sulfonamides status: Secondary | ICD-10-CM

## 2020-03-30 DIAGNOSIS — M51369 Other intervertebral disc degeneration, lumbar region without mention of lumbar back pain or lower extremity pain: Secondary | ICD-10-CM

## 2020-03-30 DIAGNOSIS — M4316 Spondylolisthesis, lumbar region: Secondary | ICD-10-CM

## 2020-03-30 DIAGNOSIS — Z8249 Family history of ischemic heart disease and other diseases of the circulatory system: Secondary | ICD-10-CM | POA: Diagnosis not present

## 2020-03-30 DIAGNOSIS — M50022 Cervical disc disorder at C5-C6 level with myelopathy: Secondary | ICD-10-CM | POA: Diagnosis not present

## 2020-03-30 DIAGNOSIS — Z79899 Other long term (current) drug therapy: Secondary | ICD-10-CM

## 2020-03-30 DIAGNOSIS — H409 Unspecified glaucoma: Secondary | ICD-10-CM | POA: Diagnosis present

## 2020-03-30 DIAGNOSIS — M488X2 Other specified spondylopathies, cervical region: Secondary | ICD-10-CM | POA: Diagnosis not present

## 2020-03-30 DIAGNOSIS — Z800 Family history of malignant neoplasm of digestive organs: Secondary | ICD-10-CM

## 2020-03-30 DIAGNOSIS — I10 Essential (primary) hypertension: Secondary | ICD-10-CM | POA: Diagnosis present

## 2020-03-30 DIAGNOSIS — M4802 Spinal stenosis, cervical region: Principal | ICD-10-CM | POA: Diagnosis present

## 2020-03-30 DIAGNOSIS — M4712 Other spondylosis with myelopathy, cervical region: Secondary | ICD-10-CM | POA: Diagnosis present

## 2020-03-30 DIAGNOSIS — R1310 Dysphagia, unspecified: Secondary | ICD-10-CM | POA: Diagnosis not present

## 2020-03-30 DIAGNOSIS — Z4889 Encounter for other specified surgical aftercare: Secondary | ICD-10-CM | POA: Diagnosis not present

## 2020-03-30 DIAGNOSIS — I7102 Dissection of abdominal aorta: Secondary | ICD-10-CM | POA: Diagnosis not present

## 2020-03-30 DIAGNOSIS — Z818 Family history of other mental and behavioral disorders: Secondary | ICD-10-CM

## 2020-03-30 DIAGNOSIS — Z8261 Family history of arthritis: Secondary | ICD-10-CM

## 2020-03-30 DIAGNOSIS — Z833 Family history of diabetes mellitus: Secondary | ICD-10-CM

## 2020-03-30 DIAGNOSIS — I2510 Atherosclerotic heart disease of native coronary artery without angina pectoris: Secondary | ICD-10-CM | POA: Diagnosis not present

## 2020-03-30 DIAGNOSIS — M4722 Other spondylosis with radiculopathy, cervical region: Secondary | ICD-10-CM | POA: Diagnosis present

## 2020-03-30 DIAGNOSIS — I4510 Unspecified right bundle-branch block: Secondary | ICD-10-CM | POA: Diagnosis present

## 2020-03-30 DIAGNOSIS — Z803 Family history of malignant neoplasm of breast: Secondary | ICD-10-CM | POA: Diagnosis not present

## 2020-03-30 DIAGNOSIS — Z881 Allergy status to other antibiotic agents status: Secondary | ICD-10-CM

## 2020-03-30 DIAGNOSIS — M1712 Unilateral primary osteoarthritis, left knee: Secondary | ICD-10-CM | POA: Diagnosis not present

## 2020-03-30 DIAGNOSIS — Z888 Allergy status to other drugs, medicaments and biological substances status: Secondary | ICD-10-CM

## 2020-03-30 DIAGNOSIS — Z7984 Long term (current) use of oral hypoglycemic drugs: Secondary | ICD-10-CM

## 2020-03-30 DIAGNOSIS — Z981 Arthrodesis status: Secondary | ICD-10-CM | POA: Diagnosis not present

## 2020-03-30 DIAGNOSIS — Z419 Encounter for procedure for purposes other than remedying health state, unspecified: Secondary | ICD-10-CM

## 2020-03-30 HISTORY — PX: ANTERIOR CERVICAL CORPECTOMY: SHX1159

## 2020-03-30 LAB — HEMOGLOBIN A1C
Hgb A1c MFr Bld: 6.8 % — ABNORMAL HIGH (ref 4.8–5.6)
Mean Plasma Glucose: 148.46 mg/dL

## 2020-03-30 LAB — GLUCOSE, CAPILLARY
Glucose-Capillary: 166 mg/dL — ABNORMAL HIGH (ref 70–99)
Glucose-Capillary: 220 mg/dL — ABNORMAL HIGH (ref 70–99)
Glucose-Capillary: 266 mg/dL — ABNORMAL HIGH (ref 70–99)
Glucose-Capillary: 249 mg/dL — ABNORMAL HIGH (ref 70–99)

## 2020-03-30 LAB — ABO/RH: ABO/RH(D): A POS

## 2020-03-30 SURGERY — ANTERIOR CERVICAL CORPECTOMY
Anesthesia: General | Site: Spine Cervical

## 2020-03-30 MED ORDER — CEFAZOLIN SODIUM-DEXTROSE 2-4 GM/100ML-% IV SOLN
2.0000 g | Freq: Three times a day (TID) | INTRAVENOUS | Status: AC
  Administered 2020-03-30 (×2): 2 g via INTRAVENOUS
  Filled 2020-03-30 (×2): qty 100

## 2020-03-30 MED ORDER — ZOLPIDEM TARTRATE 5 MG PO TABS: 5.0000 mg | ORAL_TABLET | Freq: Every evening | ORAL | Status: DC | PRN

## 2020-03-30 MED ORDER — ACETAMINOPHEN 500 MG PO TABS
1000.0000 mg | ORAL_TABLET | Freq: Four times a day (QID) | ORAL | Status: AC
  Administered 2020-03-30 – 2020-03-31 (×4): 1000 mg via ORAL
  Filled 2020-03-30 (×4): qty 2

## 2020-03-30 MED ORDER — DEXMEDETOMIDINE (PRECEDEX) IN NS 20 MCG/5ML (4 MCG/ML) IV SYRINGE
PREFILLED_SYRINGE | INTRAVENOUS | Status: DC | PRN
  Administered 2020-03-30: 8 ug via INTRAVENOUS

## 2020-03-30 MED ORDER — 0.9 % SODIUM CHLORIDE (POUR BTL) OPTIME
TOPICAL | Status: DC | PRN
  Administered 2020-03-30: 1000 mL

## 2020-03-30 MED ORDER — METFORMIN HCL 500 MG PO TABS
1000.0000 mg | ORAL_TABLET | Freq: Two times a day (BID) | ORAL | Status: DC
  Administered 2020-03-30 – 2020-04-03 (×6): 1000 mg via ORAL
  Filled 2020-03-30 (×6): qty 2

## 2020-03-30 MED ORDER — OXYCODONE HCL 5 MG PO TABS
10.0000 mg | ORAL_TABLET | ORAL | Status: DC | PRN
  Administered 2020-03-30 – 2020-04-03 (×16): 10 mg via ORAL
  Filled 2020-03-30 (×16): qty 2

## 2020-03-30 MED ORDER — ROCURONIUM BROMIDE 10 MG/ML (PF) SYRINGE
PREFILLED_SYRINGE | INTRAVENOUS | Status: AC
  Filled 2020-03-30: qty 10

## 2020-03-30 MED ORDER — BUPIVACAINE-EPINEPHRINE 0.5% -1:200000 IJ SOLN
INTRAMUSCULAR | Status: DC | PRN
  Administered 2020-03-30: 10 mL

## 2020-03-30 MED ORDER — OXYCODONE HCL 5 MG PO TABS
5.0000 mg | ORAL_TABLET | ORAL | Status: DC | PRN
  Administered 2020-03-30 – 2020-03-31 (×3): 5 mg via ORAL
  Filled 2020-03-30 (×3): qty 1

## 2020-03-30 MED ORDER — ONDANSETRON HCL 4 MG/2ML IJ SOLN
4.0000 mg | Freq: Four times a day (QID) | INTRAMUSCULAR | Status: DC | PRN
  Administered 2020-04-01: 4 mg via INTRAVENOUS
  Filled 2020-03-30: qty 2

## 2020-03-30 MED ORDER — BACITRACIN ZINC 500 UNIT/GM EX OINT: TOPICAL_OINTMENT | CUTANEOUS | Status: DC | PRN

## 2020-03-30 MED ORDER — ROCURONIUM BROMIDE 10 MG/ML (PF) SYRINGE
PREFILLED_SYRINGE | INTRAVENOUS | Status: DC | PRN
  Administered 2020-03-30: 60 mg via INTRAVENOUS
  Administered 2020-03-30: 40 mg via INTRAVENOUS

## 2020-03-30 MED ORDER — ACETAMINOPHEN 325 MG PO TABS
650.0000 mg | ORAL_TABLET | ORAL | Status: DC | PRN
  Administered 2020-03-31 – 2020-04-03 (×9): 650 mg via ORAL
  Filled 2020-03-30 (×9): qty 2

## 2020-03-30 MED ORDER — LOSARTAN POTASSIUM 50 MG PO TABS
100.0000 mg | ORAL_TABLET | Freq: Every day | ORAL | Status: DC
  Administered 2020-03-30 – 2020-04-03 (×4): 100 mg via ORAL
  Filled 2020-03-30 (×4): qty 2

## 2020-03-30 MED ORDER — HYDROMORPHONE HCL 1 MG/ML IJ SOLN: 0.2500 mg | INTRAMUSCULAR | Status: DC | PRN

## 2020-03-30 MED ORDER — BISACODYL 10 MG RE SUPP: 10.0000 mg | Freq: Every day | RECTAL | Status: DC | PRN

## 2020-03-30 MED ORDER — TRIAMTERENE-HCTZ 75-50 MG PO TABS
1.0000 | ORAL_TABLET | Freq: Every day | ORAL | Status: DC
  Administered 2020-03-30 – 2020-04-03 (×4): 1 via ORAL
  Filled 2020-03-30 (×5): qty 1

## 2020-03-30 MED ORDER — BACITRACIN ZINC 500 UNIT/GM EX OINT
TOPICAL_OINTMENT | CUTANEOUS | Status: AC
  Filled 2020-03-30: qty 28.35

## 2020-03-30 MED ORDER — SUGAMMADEX SODIUM 200 MG/2ML IV SOLN
INTRAVENOUS | Status: DC | PRN
  Administered 2020-03-30 (×3): 50 mg via INTRAVENOUS

## 2020-03-30 MED ORDER — FENTANYL CITRATE (PF) 250 MCG/5ML IJ SOLN
INTRAMUSCULAR | Status: AC
  Filled 2020-03-30: qty 5

## 2020-03-30 MED ORDER — BACLOFEN 10 MG PO TABS
5.0000 mg | ORAL_TABLET | Freq: Three times a day (TID) | ORAL | Status: DC
  Administered 2020-03-30 – 2020-04-03 (×9): 5 mg via ORAL
  Filled 2020-03-30 (×11): qty 1

## 2020-03-30 MED ORDER — MENTHOL 3 MG MT LOZG: 1.0000 | LOZENGE | OROMUCOSAL | Status: DC | PRN

## 2020-03-30 MED ORDER — PHENOL 1.4 % MT LIQD: 1.0000 | OROMUCOSAL | Status: DC | PRN

## 2020-03-30 MED ORDER — MORPHINE SULFATE (PF) 4 MG/ML IV SOLN
4.0000 mg | INTRAVENOUS | Status: DC | PRN
  Administered 2020-04-01: 4 mg via INTRAVENOUS
  Filled 2020-03-30: qty 1

## 2020-03-30 MED ORDER — DEXAMETHASONE SODIUM PHOSPHATE 10 MG/ML IJ SOLN
INTRAMUSCULAR | Status: DC | PRN
  Administered 2020-03-30: 10 mg via INTRAVENOUS

## 2020-03-30 MED ORDER — ONDANSETRON HCL 4 MG/2ML IJ SOLN
INTRAMUSCULAR | Status: AC
  Filled 2020-03-30: qty 2

## 2020-03-30 MED ORDER — MAGNESIUM OXIDE 400 (241.3 MG) MG PO TABS
200.0000 mg | ORAL_TABLET | Freq: Every day | ORAL | Status: DC
  Administered 2020-03-30 – 2020-04-03 (×4): 200 mg via ORAL
  Filled 2020-03-30 (×4): qty 1

## 2020-03-30 MED ORDER — FENTANYL CITRATE (PF) 250 MCG/5ML IJ SOLN
INTRAMUSCULAR | Status: DC | PRN
  Administered 2020-03-30: 50 ug via INTRAVENOUS
  Administered 2020-03-30: 150 ug via INTRAVENOUS
  Administered 2020-03-30: 50 ug via INTRAVENOUS
  Administered 2020-03-30: 25 ug via INTRAVENOUS
  Administered 2020-03-30 (×2): 50 ug via INTRAVENOUS

## 2020-03-30 MED ORDER — ORAL CARE MOUTH RINSE: 15.0000 mL | Freq: Once | OROMUCOSAL | Status: AC

## 2020-03-30 MED ORDER — ONDANSETRON HCL 4 MG PO TABS
4.0000 mg | ORAL_TABLET | Freq: Four times a day (QID) | ORAL | Status: DC | PRN
  Administered 2020-03-30: 4 mg via ORAL
  Filled 2020-03-30: qty 1

## 2020-03-30 MED ORDER — LATANOPROST 0.005 % OP SOLN
1.0000 [drp] | Freq: Every day | OPHTHALMIC | Status: DC
  Administered 2020-03-30 – 2020-04-02 (×3): 1 [drp] via OPHTHALMIC
  Filled 2020-03-30: qty 2.5

## 2020-03-30 MED ORDER — PHENYLEPHRINE 40 MCG/ML (10ML) SYRINGE FOR IV PUSH (FOR BLOOD PRESSURE SUPPORT)
PREFILLED_SYRINGE | INTRAVENOUS | Status: DC | PRN
  Administered 2020-03-30 (×2): 80 ug via INTRAVENOUS

## 2020-03-30 MED ORDER — CEFAZOLIN SODIUM-DEXTROSE 2-4 GM/100ML-% IV SOLN
2.0000 g | INTRAVENOUS | Status: AC
  Administered 2020-03-30: 2 g via INTRAVENOUS
  Filled 2020-03-30: qty 100

## 2020-03-30 MED ORDER — CHLORHEXIDINE GLUCONATE 0.12 % MT SOLN
15.0000 mL | Freq: Once | OROMUCOSAL | Status: AC
  Administered 2020-03-30: 15 mL via OROMUCOSAL
  Filled 2020-03-30: qty 15

## 2020-03-30 MED ORDER — BUPIVACAINE-EPINEPHRINE 0.5% -1:200000 IJ SOLN
INTRAMUSCULAR | Status: AC
  Filled 2020-03-30: qty 1

## 2020-03-30 MED ORDER — INSULIN ASPART 100 UNIT/ML ~~LOC~~ SOLN: 0.0000 [IU] | Freq: Three times a day (TID) | SUBCUTANEOUS | Status: DC

## 2020-03-30 MED ORDER — DOCUSATE SODIUM 100 MG PO CAPS
100.0000 mg | ORAL_CAPSULE | Freq: Two times a day (BID) | ORAL | Status: DC
  Administered 2020-03-30 – 2020-04-03 (×8): 100 mg via ORAL
  Filled 2020-03-30 (×8): qty 1

## 2020-03-30 MED ORDER — CYCLOBENZAPRINE HCL 10 MG PO TABS
ORAL_TABLET | ORAL | Status: AC
  Filled 2020-03-30: qty 1

## 2020-03-30 MED ORDER — SODIUM CHLORIDE 0.9% FLUSH
3.0000 mL | Freq: Two times a day (BID) | INTRAVENOUS | Status: DC
  Administered 2020-03-30 – 2020-03-31 (×3): 3 mL via INTRAVENOUS

## 2020-03-30 MED ORDER — DEXAMETHASONE SODIUM PHOSPHATE 10 MG/ML IJ SOLN
INTRAMUSCULAR | Status: AC
  Filled 2020-03-30: qty 1

## 2020-03-30 MED ORDER — SODIUM CHLORIDE 0.9 % IV SOLN: 250.0000 mL | INTRAVENOUS | Status: DC

## 2020-03-30 MED ORDER — PROPOFOL 10 MG/ML IV BOLUS
INTRAVENOUS | Status: DC | PRN
  Administered 2020-03-30: 100 mg via INTRAVENOUS

## 2020-03-30 MED ORDER — THROMBIN 20000 UNITS EX SOLR
CUTANEOUS | Status: DC | PRN
  Administered 2020-03-30: 20 mL via TOPICAL

## 2020-03-30 MED ORDER — THROMBIN 5000 UNITS EX SOLR
CUTANEOUS | Status: AC
  Filled 2020-03-30: qty 5000

## 2020-03-30 MED ORDER — THROMBIN 20000 UNITS EX SOLR
CUTANEOUS | Status: AC
  Filled 2020-03-30: qty 20000

## 2020-03-30 MED ORDER — INSULIN ASPART 100 UNIT/ML ~~LOC~~ SOLN
0.0000 [IU] | Freq: Every day | SUBCUTANEOUS | Status: DC
  Administered 2020-03-30: 2 [IU] via SUBCUTANEOUS

## 2020-03-30 MED ORDER — LACTATED RINGERS IV SOLN: INTRAVENOUS | Status: DC

## 2020-03-30 MED ORDER — DEXMEDETOMIDINE (PRECEDEX) IN NS 20 MCG/5ML (4 MCG/ML) IV SYRINGE
PREFILLED_SYRINGE | INTRAVENOUS | Status: AC
  Filled 2020-03-30: qty 5

## 2020-03-30 MED ORDER — LIDOCAINE HCL (PF) 2 % IJ SOLN
INTRAMUSCULAR | Status: AC
  Filled 2020-03-30: qty 5

## 2020-03-30 MED ORDER — CYCLOBENZAPRINE HCL 10 MG PO TABS
10.0000 mg | ORAL_TABLET | Freq: Three times a day (TID) | ORAL | Status: DC | PRN
  Administered 2020-03-30 – 2020-04-01 (×2): 10 mg via ORAL
  Filled 2020-03-30: qty 1

## 2020-03-30 MED ORDER — PHENYLEPHRINE 40 MCG/ML (10ML) SYRINGE FOR IV PUSH (FOR BLOOD PRESSURE SUPPORT)
PREFILLED_SYRINGE | INTRAVENOUS | Status: AC
  Filled 2020-03-30: qty 10

## 2020-03-30 MED ORDER — PROPOFOL 10 MG/ML IV BOLUS
INTRAVENOUS | Status: AC
  Filled 2020-03-30: qty 40

## 2020-03-30 MED ORDER — ONDANSETRON HCL 4 MG/2ML IJ SOLN
INTRAMUSCULAR | Status: DC | PRN
  Administered 2020-03-30: 4 mg via INTRAVENOUS

## 2020-03-30 MED ORDER — SUCCINYLCHOLINE CHLORIDE 200 MG/10ML IV SOSY
PREFILLED_SYRINGE | INTRAVENOUS | Status: AC
  Filled 2020-03-30: qty 10

## 2020-03-30 MED ORDER — LIDOCAINE 2% (20 MG/ML) 5 ML SYRINGE
INTRAMUSCULAR | Status: DC | PRN
  Administered 2020-03-30: 30 mg via INTRAVENOUS
  Administered 2020-03-30: 20 mg via INTRAVENOUS

## 2020-03-30 MED ORDER — PHENYLEPHRINE HCL-NACL 10-0.9 MG/250ML-% IV SOLN
INTRAVENOUS | Status: DC | PRN
  Administered 2020-03-30: 25 ug/min via INTRAVENOUS

## 2020-03-30 MED ORDER — ACETAMINOPHEN 650 MG RE SUPP: 650.0000 mg | RECTAL | Status: DC | PRN

## 2020-03-30 MED ORDER — INSULIN ASPART 100 UNIT/ML ~~LOC~~ SOLN
0.0000 [IU] | Freq: Three times a day (TID) | SUBCUTANEOUS | Status: DC
  Administered 2020-03-30: 11 [IU] via SUBCUTANEOUS

## 2020-03-30 MED ORDER — SODIUM CHLORIDE 0.9% FLUSH: 3.0000 mL | INTRAVENOUS | Status: DC | PRN

## 2020-03-30 MED ORDER — AMLODIPINE BESYLATE 5 MG PO TABS
5.0000 mg | ORAL_TABLET | Freq: Every day | ORAL | Status: DC
  Administered 2020-03-31 – 2020-04-03 (×3): 5 mg via ORAL
  Filled 2020-03-30 (×3): qty 1

## 2020-03-30 MED ORDER — THROMBIN 5000 UNITS EX SOLR
OROMUCOSAL | Status: DC | PRN
  Administered 2020-03-30 (×2): 5 mL via TOPICAL

## 2020-03-30 SURGICAL SUPPLY — 61 items
BAND RUBBER #18 3X1/16 STRL (MISCELLANEOUS) IMPLANT
BENZOIN TINCTURE PRP APPL 2/3 (GAUZE/BANDAGES/DRESSINGS) ×2 IMPLANT
BIT DRILL NEURO 2X3.1 SFT TUCH (MISCELLANEOUS) ×1 IMPLANT
BLADE SURG 15 STRL LF DISP TIS (BLADE) ×1 IMPLANT
BLADE SURG 15 STRL SS (BLADE) ×1
BLADE ULTRA TIP 2M (BLADE) ×2 IMPLANT
BUR BARREL STRAIGHT FLUTE 4.0 (BURR) ×2 IMPLANT
BUR MATCHSTICK NEURO 3.0 LAGG (BURR) ×2 IMPLANT
CANISTER SUCT 3000ML PPV (MISCELLANEOUS) ×2 IMPLANT
CARTRIDGE OIL MAESTRO DRILL (MISCELLANEOUS) ×1 IMPLANT
COVER MAYO STAND STRL (DRAPES) ×2 IMPLANT
COVER WAND RF STERILE (DRAPES) IMPLANT
DECANTER SPIKE VIAL GLASS SM (MISCELLANEOUS) ×2 IMPLANT
DIFFUSER DRILL AIR PNEUMATIC (MISCELLANEOUS) ×2 IMPLANT
DRAPE LAPAROTOMY 100X72 PEDS (DRAPES) ×2 IMPLANT
DRAPE MICROSCOPE LEICA (MISCELLANEOUS) IMPLANT
DRAPE SURG 17X23 STRL (DRAPES) ×4 IMPLANT
DRILL NEURO 2X3.1 SOFT TOUCH (MISCELLANEOUS) ×2
DRSG OPSITE POSTOP 3X4 (GAUZE/BANDAGES/DRESSINGS) IMPLANT
DRSG OPSITE POSTOP 4X6 (GAUZE/BANDAGES/DRESSINGS) ×2 IMPLANT
ELECT REM PT RETURN 9FT ADLT (ELECTROSURGICAL) ×2
ELECTRODE REM PT RTRN 9FT ADLT (ELECTROSURGICAL) ×1 IMPLANT
GAUZE 4X4 16PLY RFD (DISPOSABLE) IMPLANT
GLOVE BIO SURGEON STRL SZ 6.5 (GLOVE) ×14 IMPLANT
GLOVE BIO SURGEON STRL SZ7.5 (GLOVE) ×4 IMPLANT
GLOVE BIO SURGEON STRL SZ8 (GLOVE) ×2 IMPLANT
GLOVE BIO SURGEON STRL SZ8.5 (GLOVE) ×2 IMPLANT
GLOVE BIOGEL PI IND STRL 6.5 (GLOVE) ×7 IMPLANT
GLOVE BIOGEL PI INDICATOR 6.5 (GLOVE) ×7
GLOVE EXAM NITRILE XL STR (GLOVE) IMPLANT
GLOVE SURG SS PI 6.5 STRL IVOR (GLOVE) ×6 IMPLANT
GOWN STRL REUS W/ TWL LRG LVL3 (GOWN DISPOSABLE) ×6 IMPLANT
GOWN STRL REUS W/ TWL XL LVL3 (GOWN DISPOSABLE) ×1 IMPLANT
GOWN STRL REUS W/TWL LRG LVL3 (GOWN DISPOSABLE) ×6
GOWN STRL REUS W/TWL XL LVL3 (GOWN DISPOSABLE) ×1
HEMOSTAT POWDER KIT SURGIFOAM (HEMOSTASIS) ×4 IMPLANT
KIT BASIN OR (CUSTOM PROCEDURE TRAY) ×2 IMPLANT
KIT TURNOVER KIT B (KITS) ×2 IMPLANT
MARKER SKIN DUAL TIP RULER LAB (MISCELLANEOUS) ×2 IMPLANT
NEEDLE HYPO 22GX1.5 SAFETY (NEEDLE) ×2 IMPLANT
NEEDLE SPNL 18GX3.5 QUINCKE PK (NEEDLE) ×2 IMPLANT
NS IRRIG 1000ML POUR BTL (IV SOLUTION) ×2 IMPLANT
OIL CARTRIDGE MAESTRO DRILL (MISCELLANEOUS) ×2
PACK LAMINECTOMY NEURO (CUSTOM PROCEDURE TRAY) ×2 IMPLANT
PATTIES SURGICAL 1X1 (DISPOSABLE) ×2 IMPLANT
PIN DISTRACTION 14MM (PIN) ×4 IMPLANT
PLATE ANT CERV XTEND 2 LV 46 (Plate) ×2 IMPLANT
SCREW VAR 4.2 XD SELF DRILL 12 (Screw) ×4 IMPLANT
SCREW VAR 4.2 XD SELF DRILL 14 (Screw) ×4 IMPLANT
SPACER 33-53 12 CORE TI AN (Spacer) ×2 IMPLANT
SPACER CERV TI 12X14X12 0D (Spacer) ×2 IMPLANT
SPACER LOWER FORTIFY 12X14 0D (Plate) ×2 IMPLANT
SPONGE INTESTINAL PEANUT (DISPOSABLE) ×2 IMPLANT
SPONGE SURGIFOAM ABS GEL 100 (HEMOSTASIS) ×2 IMPLANT
STRIP CLOSURE SKIN 1/2X4 (GAUZE/BANDAGES/DRESSINGS) ×2 IMPLANT
SUT VIC AB 0 CT1 27 (SUTURE) ×1
SUT VIC AB 0 CT1 27XBRD ANTBC (SUTURE) ×1 IMPLANT
SUT VIC AB 3-0 SH 8-18 (SUTURE) ×2 IMPLANT
TOWEL GREEN STERILE (TOWEL DISPOSABLE) ×2 IMPLANT
TOWEL GREEN STERILE FF (TOWEL DISPOSABLE) ×2 IMPLANT
WATER STERILE IRR 1000ML POUR (IV SOLUTION) ×2 IMPLANT

## 2020-03-30 NOTE — Anesthesia Procedure Notes (Signed)
Procedure Name: Intubation Date/Time: 03/30/2020 7:47 AM Performed by: Aundria Rud, CRNA Pre-anesthesia Checklist: Patient identified, Emergency Drugs available, Suction available and Patient being monitored Patient Re-evaluated:Patient Re-evaluated prior to induction Oxygen Delivery Method: Circle System Utilized Preoxygenation: Pre-oxygenation with 100% oxygen Induction Type: IV induction Ventilation: Mask ventilation without difficulty Laryngoscope Size: Glidescope and 3 Grade View: Grade I Tube type: Oral Tube size: 7.0 mm Number of attempts: 1 Airway Equipment and Method: Stylet and Video-laryngoscopy Placement Confirmation: ETT inserted through vocal cords under direct vision,  positive ETCO2 and breath sounds checked- equal and bilateral Secured at: 22 cm Tube secured with: Tape Dental Injury: Teeth and Oropharynx as per pre-operative assessment

## 2020-03-30 NOTE — H&P (Signed)
Subjective: The patient is an 79 year old white female who is presented with a progressive cervical myelopathy.  She was worked up with a cervical MRI which demonstrated severe stenosis.  I discussed the various treatment of the with her.  She has decided proceed with surgery.  Past Medical History:  Diagnosis Date  . Allergy   . Arthritis   . Bundle branch block, right   . Diabetes mellitus   . Diffuse cystic mastopathy   . Dissection, aorta (HCC)    Dr.Dew follows  . Diverticulosis   . Family history of malignant neoplasm of gastrointestinal tract   . Glaucoma 2005  . Heart murmur   . History of bronchitis   . History of pancreatitis 2005  . Hypertension 1982  . Personal history of tobacco use, presenting hazards to health   . RBBB   . Ulcer     Past Surgical History:  Procedure Laterality Date  . ABDOMINAL HYSTERECTOMY  1991  . APPENDECTOMY    . BACK SURGERY  1982   for lumbar ruptured disc. No hardware, no discectomy, no fusion  . BLADDER SURGERY    . BREAST BIOPSY Right 1980's   benign  . BREAST EXCISIONAL BIOPSY Right 1980s   benign  . BREAST SURGERY     biopsy  . CARDIAC CATHETERIZATION     Dr. Lady Gary, few years ago  . CATARACT EXTRACTION W/PHACO Left 08/31/2015   Procedure: CATARACT EXTRACTION PHACO AND INTRAOCULAR LENS PLACEMENT (IOC);  Surgeon: Sherald Hess, MD;  Location: [facility];  Service: Ophthalmology;  Laterality: Left;  DIABETIC - oral meds  . CATARACT EXTRACTION W/PHACO Right 10/12/2015   Procedure: CATARACT EXTRACTION PHACO AND INTRAOCULAR LENS PLACEMENT (IOC) right eye;  Surgeon: Sherald Hess, MD;  Location: [facility];  Service: Ophthalmology;  Laterality: Right;  DIABETIC - oral meds  . CHOLECYSTECTOMY  1994  . COLONOSCOPY  2008,02/22/2012   Dr. Sankar-2013  . COLONOSCOPY WITH PROPOFOL N/A 04/04/2017   Procedure: COLONOSCOPY WITH PROPOFOL;  Surgeon: [name], MD;  Location: [facility];   Service: Endoscopy;  Laterality: N/A;  . REFRACTIVE SURGERY      Allergies  Allergen Reactions  . Beta Adrenergic Blockers     Chest pressure/difficulty breathing  . Jardiance [Empagliflozin]     Muscle cramps  . Levaquin [Levofloxacin In D5w]     Low blood sugar; does take this when she needs it. She just makes sure to control her BS to avoid hypoglycemia.  . Lisinopril Cough  . Zetia [Ezetimibe]     Lethargic   . Prevnar [Pneumococcal 13-Val Conj Vacc] Rash    Joint Pain  . Sulfa Antibiotics Rash    "makes my skin raw"    Social History   Tobacco Use  . Smoking status: Former Smoker    Packs/day: 1.00    Years: 15.00    Pack years: 15.00    Types: Cigarettes    Quit date: 08/03/2003    Years since quitting: 16.6  . Smokeless tobacco: Never Used  Substance Use Topics  . Alcohol use: No    Family History  Problem Relation Age of Onset  . Diabetes Mother   . Hypertension Mother   . Anxiety disorder Mother   . Heart disease Father        rheumatic heart disease  . Heart attack Father   . Rheum arthritis Father   . Colon cancer Brother   . Hypertension Maternal Grandmother   . Hypertension Maternal  Grandfather   . Breast cancer Maternal Aunt 60  . Breast cancer Cousin 50   Prior to Admission medications   Medication Sig Start Date End Date Taking? Authorizing Provider  amiloride-hydrochlorothiazide (MODURETIC) 5-50 MG tablet TAKE 1/2 (ONE-HALF) TABLET BY MOUTH ONCE DAILY AS NEEDED FOR SWELLING Patient taking differently: Take 0.5 tablets by mouth daily.  10/24/19  Yes Bacigalupo, Marzella Schlein, MD  amLODipine (NORVASC) 5 MG tablet Take 1 tablet by mouth once daily Patient taking differently: Take 5 mg by mouth daily.  01/18/20  Yes Bacigalupo, Marzella Schlein, MD  aspirin 81 MG EC tablet Take 81 mg by mouth daily.     Yes [provider]  baclofen (LIORESAL) 10 MG tablet Take 5 mg by mouth 3 (three) times daily.   Yes [provider]  Cyanocobalamin (VITAMIN  B-12) 1000 MCG/15ML LIQD Take 1,000 mcg by mouth daily.    Yes [provider]  Ibuprofen (ADVIL) 200 MG CAPS Take 200 mg by mouth daily.    Yes [provider]  latanoprost (XALATAN) 0.005 % ophthalmic solution Place 1 drop into both eyes at bedtime.  04/09/15  Yes [provider]  losartan (COZAAR) 100 MG tablet Take 1 tablet by mouth once daily Patient taking differently: Take 100 mg by mouth daily.  01/18/20  Yes Bacigalupo, Marzella Schlein, MD  Magnesium 250 MG TABS Take 250 mg by mouth daily.   Yes [provider]  metFORMIN (GLUCOPHAGE) 500 MG tablet TAKE 2 TABLETS BY MOUTH TWICE DAILY WITH MEALS Patient taking differently: Take 1,000 mg by mouth 2 (two) times daily.  02/11/20  Yes Bacigalupo, Marzella Schlein, MD  ezetimibe (ZETIA) 10 MG tablet Take 1 tablet (10 mg total) by mouth daily. Patient not taking: Reported on 03/18/2020 06/03/19   Antonieta Iba, MD     Review of Systems  Positive ROS: As above  All other systems have been reviewed and were otherwise negative with the exception of those mentioned in the HPI and as above.  Objective: Vital signs in last 24 hours: Temp:  [97.5 F (36.4 C)] 97.5 F (36.4 C) (11/29 0649) Pulse Rate:  [85] 85 (11/29 0649) Resp:  [18] 18 (11/29 0649) BP: (187)/(74) 187/74 (11/29 0649) SpO2:  [99 %] 99 % (11/29 0649) Weight:  [63.7 kg] 63.7 kg (11/29 0649) Estimated body mass index is 24.89 kg/m as calculated from the following:   Height as of this encounter: 5\' 3"  (1.6 m).   Weight as of this encounter: 63.7 kg.   General Appearance: Alert Head: Normocephalic, without obvious abnormality, atraumatic Eyes: PERRL, conjunctiva/corneas clear, EOM's intact,    Ears: Normal  Throat: Normal  Neck: Supple, Back: unremarkable Lungs: Clear to auscultation bilaterally, respirations unlabored Heart: Regular rate and rhythm, no murmur, rub or gallop Abdomen: Soft, non-tender Extremities: Extremities normal, atraumatic,  no cyanosis or edema Skin: unremarkable  NEUROLOGIC:   Mental status: alert and oriented,Motor Exam -she has weakness in her right greater left hand. Sensory Exam - grossly normal Reflexes:  Coordination - grossly normal Gait - grossly normal Balance - grossly normal Cranial Nerves: I: smell Not tested  II: visual acuity  OS: Normal  OD: Normal   II: visual fields Full to confrontation  II: pupils Equal, round, reactive to light  III,VII: ptosis None  III,IV,VI: extraocular muscles  Full ROM  V: mastication Normal  V: facial light touch sensation  Normal  V,VII: corneal reflex  Present  VII: facial muscle function - upper  Normal  VII: facial muscle function - lower Normal  VIII: hearing Not tested  IX: soft palate elevation  Normal  IX,X: gag reflex Present  XI: trapezius strength  5/5  XI: sternocleidomastoid strength 5/5  XI: neck flexion strength  5/5  XII: tongue strength  Normal    Data Review Lab Results  Component Value Date   WBC 6.4 03/24/2020   HGB 13.4 03/24/2020   HCT 39.8 03/24/2020   MCV 91.1 03/24/2020   PLT 304 03/24/2020   Lab Results  Component Value Date   NA 131 (L) 03/24/2020   K 4.2 03/24/2020   CL 93 (L) 03/24/2020   CO2 26 03/24/2020   BUN 13 03/24/2020   CREATININE 0.81 03/24/2020   GLUCOSE 168 (H) 03/24/2020   Lab Results  Component Value Date   INR 1.0 03/13/2020    Assessment/Plan: Cervical spinal stenosis, ossification of posterior longitudinal ligament, cervical myelopathy, cervicalgia, cervical radiculopathy: I have discussed the situation with the patient.  I have reviewed her imaging studies with her and pointed out the abnormalities.  We have discussed the various treatment options including surgery.  I have described the surgical treatment option of a C4 and C5 corpectomy with anterior instrumentation and fusion and a second stage posterior instrumentation and fusion.  I have described that surgery to her.  I have shown her  surgical models.  I have given her a surgical pamphlet.  We have discussed the risk, benefits, alternatives, expected postoperative course, and likelihood of achieving our goals with surgery.  I have answered all her questions.  She has decided proceed with surgery.   Cristi Loron 03/30/2020 7:29 AM

## 2020-03-30 NOTE — Transfer of Care (Signed)
Immediate Anesthesia Transfer of Care Note  Patient: Jessica Wheeler  Procedure(s) Performed: CORPECTOMY CERVICAL FOUR - CERVICAL FIVE (N/A Spine Cervical)  Patient Location: PACU  Anesthesia Type:General  Level of Consciousness: awake and alert   Airway & Oxygen Therapy: Patient Spontanous Breathing and Patient connected to face mask oxygen  Post-op Assessment: Report given to RN and Post -op Vital signs reviewed and stable  Post vital signs: Reviewed and stable  Last Vitals:  Vitals Value Taken Time  BP 98/41 03/30/20 1104  Temp    Pulse 80 03/30/20 1106  Resp 20 03/30/20 1106  SpO2 93 % 03/30/20 1106  Vitals shown include unvalidated device data.  Last Pain:  Vitals:   03/30/20 0649  TempSrc: Temporal  PainSc:          Complications: No complications documented.

## 2020-03-30 NOTE — Evaluation (Signed)
Physical Therapy Evaluation & Discharge Patient Details Name: Jessica Wheeler MRN: [id number] DOB: 14-Jan-1941 Today's Date: 03/30/2020   History of Present Illness  Patient is an 79 y/o female who has progressive cervical myelopathy and severe stenosis. PMH includes arthritis, RBBB, DM, aorta dissection, diverticulosis, glaucoma, HTN. Patient s/p ACDF C3-6 on 11/29. Patient to undergo posterior fusion on 12/1.  Clinical Impression  Prior to surgery, patient lives alone and independent with mobility. Patient overall modI for mobility with supervision for stair negotiation. Patient ambulated 250' modI with no AD. Patient negotiated 3 stairs sideways and rail on R, patient states she was previously negotiating them this way due to Aspen collar. Patient does not require further skilled PT services in the acute setting. No PT follow up recommendations at this time. PT to sign off at this time.     Follow Up Recommendations No PT follow up    Equipment Recommendations  None recommended by PT    Recommendations for Other Services       Precautions / Restrictions Precautions Precautions: Cervical Precaution Booklet Issued: Yes (comment) Required Braces or Orthoses: Cervical Brace Cervical Brace: Hard collar;At all times Restrictions Weight Bearing Restrictions: No      Mobility  Bed Mobility Overal bed mobility: Needs Assistance Bed Mobility: Supine to Sit;Sit to Supine     Supine to sit: Modified independent (Device/Increase time) Sit to supine: Modified independent (Device/Increase time)        Transfers Overall transfer level: Modified independent Equipment used: None                Ambulation/Gait Ambulation/Gait assistance: Modified independent (Device/Increase time) Gait Distance (Feet): 250 Feet Assistive device: None Gait Pattern/deviations: WFL(Within Functional Limits)        Stairs Stairs: Yes Stairs assistance: Supervision Stair Management: One  rail Right;Sideways Number of Stairs: 3 General stair comments: patient states she was negotiating stairs sideways previously with the Aspen collar to be able to see her feet.  Wheelchair Mobility    Modified Rankin (Stroke Patients Only)       Balance Overall balance assessment: No apparent balance deficits (not formally assessed)                                           Pertinent Vitals/Pain Pain Assessment: Faces Faces Pain Scale: Hurts little more Pain Location: neck Pain Descriptors / Indicators: Grimacing Pain Intervention(s): Monitored during session    Home Living Family/patient expects to be discharged to:: Private residence Living Arrangements: Alone Available Help at Discharge: Family;Available PRN/intermittently Type of Home: House Home Access: Stairs to enter Entrance Stairs-Rails: Right Entrance Stairs-Number of Steps: 3 Home Layout: One level Home Equipment: None (owns equipment from her late husband)      Prior Function Level of Independence: Independent               Hand Dominance        Extremity/Trunk Assessment   Upper Extremity Assessment Upper Extremity Assessment: Defer to OT evaluation    Lower Extremity Assessment Lower Extremity Assessment: Overall WFL for tasks assessed       Communication   Communication: No difficulties  Cognition Arousal/Alertness: Awake/alert Behavior During Therapy: WFL for tasks assessed/performed Overall Cognitive Status: Within Functional Limits for tasks assessed  General Comments General comments (skin integrity, edema, etc.): son present    Exercises     Assessment/Plan    PT Assessment Patent does not need any further PT services  PT Problem List         PT Treatment Interventions      PT Goals (Current goals can be found in the Care Plan section)  Acute Rehab PT Goals Patient Stated Goal: to go home PT  Goal Formulation: With patient    Frequency     Barriers to discharge        Co-evaluation               AM-PAC PT "6 Clicks" Mobility  Outcome Measure Help needed turning from your back to your side while in a flat bed without using bedrails?: None Help needed moving from lying on your back to sitting on the side of a flat bed without using bedrails?: None Help needed moving to and from a bed to a chair (including a wheelchair)?: None Help needed standing up from a chair using your arms (e.g., wheelchair or bedside chair)?: None Help needed to walk in hospital room?: None Help needed climbing 3-5 steps with a railing? : None 6 Click Score: 24    End of Session Equipment Utilized During Treatment: Gait belt Activity Tolerance: Patient tolerated treatment well Patient left: in bed;with call bell/phone within reach;with family/visitor present Nurse Communication: Mobility status PT Visit Diagnosis: Muscle weakness (generalized) (M62.81)    Time: 4481-8563 PT Time Calculation (min) (ACUTE ONLY): 28 min   Charges:   PT Evaluation $PT Eval Low Complexity: 1 Low PT Treatments $Therapeutic Activity: 8-22 mins        Gregor Hams, PT, DPT Acute Rehabilitation Services Pager [phone] Office [phone]   Zannie Kehr Allred 03/30/2020, 2:33 PM

## 2020-03-30 NOTE — Op Note (Signed)
Brief history: The patient is an 79 year old white female who has complained of a progressive cervical myelopathy.  She failed medical management and was worked up with cervical x-rays, cervical CT and a cervical MRI which demonstrated spondylosis, ossification of posterior longitudinal ligament, severe stenosis, etc.  I discussed the various treatment options.  She has decided proceed with surgery.  This is the first stage of a planned two-stage procedure  Preoperative diagnosis: Cervical spondylosis, cervical stenosis, cervical myelopathy, ossification of posterior longitudinal ligament, cervicalgia  Postoperative diagnosis: The same  Procedure: C4 and C5 corpectomy with anterior cervical discectomy/decompression at C3-4, C4-5 and C5-6; C3-4, C4-5 and C5-6 interbody arthrodesis with local morcellized autograft bone ; insertion of interbody prosthesis from C3-C6 (globus titanium expandable interbody prosthesis); anterior cervical plating from C3-C6 with globus titanium plate.  This is the first stage of a planned two-stage procedure.  Surgeon: Dr. Delma Officer  Asst.: Hildred Priest, NP  Anesthesia: Gen. endotracheal  Estimated blood loss: 125 cc  Drains: None  Complications: None  Description of procedure: The patient was brought to the operating room by the anesthesia team. General endotracheal anesthesia was induced. A roll was placed under the patient's shoulders to keep the neck in the neutral position. The patient's anterior cervical region was then prepared with Betadine scrub and Betadine solution. Sterile drapes were applied.  The area to be incised was then injected with Marcaine with epinephrine solution. I then used a scalpel to make a transverse incision in the patient's left anterior neck. I used the Metzenbaum scissors to divide the platysmal muscle and then to dissect medial to the sternocleidomastoid muscle, jugular vein, and carotid artery. I carefully dissected down towards  the anterior cervical spine identifying the esophagus and retracting it medially. Then using Kitner swabs to clear soft tissue from the anterior cervical spine. We then inserted a bent spinal needle into the upper exposed intervertebral disc space. We then obtained intraoperative radiographs confirm our location.  I then used electrocautery to detach the medial border of the longus colli muscle bilaterally from the C3-4, C4-5 and C5-6 intervertebral disc spaces. I then inserted the Caspar self-retaining retractor underneath the longus colli muscle bilaterally to provide exposure.  We then incised the intervertebral disc at C3-4, C4-5 and C5-6. We then performed a partial intervertebral discectomy at C3-4, C4-5 and C5-6 with a pituitary forceps and the Karlin curettes. I then inserted distraction screws into the vertebral bodies at C3 and C6. We then distracted the interspace. We then used the high-speed drill and electrocautery to remove the soft tissue and vertebral endplates from the C4 and C5 vertebral bodies.  I performed a partial corpectomy using Leksell rongeurs and saved the bone for later use as local autograft bone.  I then used a high-speed drill to drill away the remainder of the intervertebral disc, to drill away some posterior spondylosis, and to thin out the posterior longitudinal ligament. I then incised ligament with the arachnoid knife. We then removed the ligament with a Kerrison punches undercutting the vertebral endplates at C3 and C6 and decompressing the thecal sac.  The posterior longitudinal ligament and spondylosis was somewhat adherent to the dura consistent with ossification of the posterior longitudinal ligament.  I was able to free it up with the nerve and remove the ligament and the spondylosis without durotomy.  We then performed foraminotomies about the bilateral C4, C5 and C6 nerve roots. This completed the decompression at C3-4, C4-5 and C5-6  We now turned our to  attention  to the interbody fusion. We used the trial spacers to determine the appropriate size for the interbody prosthesis. . We then inserted the prosthesis into the distracted corpectomy site from C3-C6.  We expanded the prosthesis.  We obtained intraoperative radiograph which demonstrated good position of the prosthesis.  We then removed the distraction screws. There was a good snug fit of the prosthesis in the corpectomy defect.  We filled anterior and lateral to the prosthesis with the local morselized autograft bone we obtained during the decompression planing the interbody fusion at C3-4, C4-5 and C5-6  Having completed the fusion we now turned attention to the anterior spinal instrumentation. We used the high-speed drill to drill away some anterior spondylosis at the disc spaces so that the plate lay down flat. We selected the appropriate length titanium anterior cervical plate. We laid it along the anterior aspect of the vertebral bodies from C3-C6.  We then secured the plate to the vertebral bodies by placing two 12 and 14 mm self-drilling screws at C3 and C6. We then obtained intraoperative radiograph. The demonstrating good position of the instrumentation. We therefore secured the screws the plate the locking each cam. This completed the instrumentation.  We then obtained hemostasis using bipolar electrocautery. We irrigated the wound out with bacitracin solution. We then removed the retractor. We inspected the esophagus for any damage. There was none apparent. We then reapproximated patient's platysmal muscle with interrupted 3-0 Vicryl suture. We then reapproximated the subcutaneous tissue with interrupted 3-0 Vicryl suture. The skin was reapproximated with Steri-Strips and benzoin. The wound was then covered with bacitracin ointment. A sterile dressing was applied. The drapes were removed. Patient was subsequently extubated by the anesthesia team and transported to the post anesthesia care unit in stable  condition. All sponge instrument and needle counts were reportedly correct at the end of this case.

## 2020-03-30 NOTE — Progress Notes (Signed)
Subjective: The patient is somnolent but easily arousable.  She is in no apparent distress.  She looks well.  Objective: Vital signs in last 24 hours: Temp:  [97.1 F (36.2 C)-97.5 F (36.4 C)] 97.1 F (36.2 C) (11/29 1104) Pulse Rate:  [78-85] 81 (11/29 1149) Resp:  [8-18] 16 (11/29 1149) BP: (98-187)/(41-74) 118/49 (11/29 1149) SpO2:  [92 %-99 %] 92 % (11/29 1149) Weight:  [63.7 kg] 63.7 kg (11/29 0649) Estimated body mass index is 24.89 kg/m as calculated from the following:   Height as of this encounter: 5\' 3"  (1.6 m).   Weight as of this encounter: 63.7 kg.   Intake/Output from previous day: No intake/output data recorded. Intake/Output this shift: Total I/O In: 1190 [P.O.:240; I.V.:800; Other:50; IV Piggyback:100] Out: 670 [Urine:370; Blood:300]  Physical exam the patient is somnolent but arousable.  She is moving all 4 extremities well.  Her dressing is clean and dry.  There is no hematoma or shift.  Lab Results: No results for input(s): WBC, HGB, HCT, PLT in the last 72 hours. BMET No results for input(s): NA, K, CL, CO2, GLUCOSE, BUN, CREATININE, CALCIUM in the last 72 hours.  Studies/Results: No results found.  Assessment/Plan: The patient is doing well.  I spoke with her son.  LOS: 0 days     03/30/2020, 12:15 PM

## 2020-03-30 NOTE — Progress Notes (Signed)
In the presence of patient, Patient name and MRN on the hospital band was compared and matched exactly to the patient name and MRN on the blood band bracelet and patient confirmed the she has not been transfused since the pretransfusion blood was drawn.

## 2020-03-30 NOTE — Anesthesia Postprocedure Evaluation (Signed)
Anesthesia Post Note  Patient: Jessica Wheeler  Procedure(s) Performed: CORPECTOMY CERVICAL FOUR - CERVICAL FIVE (N/A Spine Cervical)     Patient location during evaluation: PACU Anesthesia Type: General Level of consciousness: awake Pain management: pain level controlled Vital Signs Assessment: post-procedure vital signs reviewed and stable Respiratory status: spontaneous breathing Cardiovascular status: stable Postop Assessment: no apparent nausea or vomiting Anesthetic complications: no   No complications documented.  Last Vitals:  Vitals:   03/30/20 1248 03/30/20 1541  BP: (!) 145/57 (!) 147/54  Pulse: 85 96  Resp: 20 18  Temp: 36.6 C 36.9 C  SpO2: 99% 96%    Last Pain:  Vitals:   03/30/20 1730  TempSrc:   PainSc: 8                  Samreen Seltzer

## 2020-03-31 ENCOUNTER — Encounter (HOSPITAL_COMMUNITY): Payer: Self-pay | Admitting: Neurosurgery

## 2020-03-31 LAB — CBC
HCT: 32.3 % — ABNORMAL LOW (ref 36.0–46.0)
Hemoglobin: 11.2 g/dL — ABNORMAL LOW (ref 12.0–15.0)
MCH: 30.9 pg (ref 26.0–34.0)
MCHC: 34.7 g/dL (ref 30.0–36.0)
MCV: 89.2 fL (ref 80.0–100.0)
Platelets: 243 10*3/uL (ref 150–400)
RBC: 3.62 MIL/uL — ABNORMAL LOW (ref 3.87–5.11)
RDW: 13.2 % (ref 11.5–15.5)
WBC: 11.1 10*3/uL — ABNORMAL HIGH (ref 4.0–10.5)
nRBC: 0 % (ref 0.0–0.2)

## 2020-03-31 LAB — BASIC METABOLIC PANEL
Anion gap: 13 (ref 5–15)
BUN: 13 mg/dL (ref 8–23)
CO2: 23 mmol/L (ref 22–32)
Calcium: 8.8 mg/dL — ABNORMAL LOW (ref 8.9–10.3)
Chloride: 90 mmol/L — ABNORMAL LOW (ref 98–111)
Creatinine, Ser: 0.81 mg/dL (ref 0.44–1.00)
GFR, Estimated: >60 mL/min (ref >60)
Glucose, Bld: 137 mg/dL — ABNORMAL HIGH (ref 70–99)
Potassium: 3.8 mmol/L (ref 3.5–5.1)
Sodium: 126 mmol/L — ABNORMAL LOW (ref 135–145)

## 2020-03-31 LAB — GLUCOSE, CAPILLARY
Glucose-Capillary: 147 mg/dL — ABNORMAL HIGH (ref 70–99)
Glucose-Capillary: 188 mg/dL — ABNORMAL HIGH (ref 70–99)
Glucose-Capillary: 158 mg/dL — ABNORMAL HIGH (ref 70–99)
Glucose-Capillary: 163 mg/dL — ABNORMAL HIGH (ref 70–99)

## 2020-03-31 MED FILL — Thrombin For Soln 5000 Unit: CUTANEOUS | Qty: 5000 | Status: AC

## 2020-03-31 NOTE — Progress Notes (Signed)
Subjective: The patient is alert and pleasant.  She looks well.  She has mild dysphagia.  She has ambulated well.  Objective: Vital signs in last 24 hours: Temp:  [97.1 F (36.2 C)-98.5 F (36.9 C)] 98.1 F (36.7 C) (11/30 0416) Pulse Rate:  [78-99] 99 (11/30 0416) Resp:  [8-20] 18 (11/30 0416) BP: (98-147)/(41-65) 139/56 (11/30 0416) SpO2:  [92 %-99 %] 95 % (11/30 0416) Estimated body mass index is 24.89 kg/m as calculated from the following:   Height as of this encounter: 5\' 3"  (1.6 m).   Weight as of this encounter: 63.7 kg.   Intake/Output from previous day: 11/29 0701 - 11/30 0700 In: 1290 [P.O.:240; I.V.:800; IV Piggyback:200] Out: 670 [Urine:370; Blood:300] Intake/Output this shift: No intake/output data recorded.  Physical exam the patient is alert and pleasant.  Her dressing is clean and dry.  There is no hematoma or shift.  She is moving all 4 extremities well.  Lab Results: Recent Labs    03/31/20 0609  WBC 11.1*  HGB 11.2*  HCT 32.3*  PLT 243   BMET Recent Labs    03/31/20 0609  NA 126*  K 3.8  CL 90*  CO2 23  GLUCOSE 137*  BUN 13  CREATININE 0.81  CALCIUM 8.8*    Studies/Results: DG Cervical Spine 2-3 Views  Result Date: 03/30/2020 CLINICAL DATA:  Portable cervical spine imaging for surgical localization. C4-C5 corpectomy anterior fusion. EXAM: CERVICAL SPINE - 2-3 VIEW COMPARISON:  None. FINDINGS: Initial image shows placement of a surgical needle from an anterior approach, tip projecting over the mid aspect of the C3-C4 disc. Subsequent images show placement an intervertebral fusion device spanning from the lower endplate of C3 to the upper endplate of C6, and placement of an anterior fusion plate spanning from C3 through C6. IMPRESSION: Portable imaging provided cervical spine surgery and fusion. Electronically Signed   By: 04/01/2020 M.D.   On: 03/30/2020 12:21    Assessment/Plan: Postop day #1: The patient is doing well.  We will plan a  second stage tomorrow.  I have answered all her questions.  LOS: 1 day     04/01/2020 03/31/2020, 7:37 AM

## 2020-03-31 NOTE — Telephone Encounter (Signed)
rec'd clearance request today, she had been cleared back in Oct for this surgery but I have called pt several times and have not been able to reach her.  Surgery is tomorrow.  Left messages to call back

## 2020-03-31 NOTE — Evaluation (Signed)
Occupational Therapy Evaluation Patient Details Name: Jessica Wheeler MRN: [id number] DOB: Dec 15, 1940 Today's Date: 03/31/2020    History of Present Illness Patient is an 79 y/o female who has progressive cervical myelopathy and severe stenosis. PMH includes arthritis, RBBB, DM, aorta dissection, diverticulosis, glaucoma, HTN. Patient s/p ACDF C3-6 on 11/29. Patient to undergo posterior fusion on 12/1.   Clinical Impression   PTA, pt lives alone and reports Independence in all daily tasks. Pt very active at baseline, walks 2 miles daily. Pt presents now overall Modified Independent with all ADLs without use of AD. Pt has previously worn a hard collar and familiar with cervical precautions. Pt able to demonstrate hallway mobility Independently without safety concerns. Pt does note continued diminished sensation in fingers (reports MD relayed extended time for full sensation to return). Pt does report some anxiety over showering tasks at home, hesitant to remove hard collar for this task. Suspect pt will do well at home with ADLs, IADLs and mobility, but encouraged her to have a friend with her during initial showering task to maximize safety. No further skilled OT services indicated at this time. Noted pt with planned second surgery tomorrow. If needs or functional abilities change, please reconsult. OT to sign off.     Follow Up Recommendations  No OT follow up    Equipment Recommendations  None recommended by OT    Recommendations for Other Services       Precautions / Restrictions Precautions Precautions: Cervical Precaution Booklet Issued: Yes (comment) Required Braces or Orthoses: Cervical Brace Cervical Brace: Hard collar;At all times;Other (comment) (can be removed for showering) Restrictions Weight Bearing Restrictions: No      Mobility Bed Mobility Overal bed mobility: Needs Assistance Bed Mobility: Supine to Sit;Sit to Supine     Supine to sit: Modified independent  (Device/Increase time);HOB elevated Sit to supine: Modified independent (Device/Increase time);HOB elevated   General bed mobility comments: Modified Independent with HOB highly elevated. Pt reports sleeping on a wedge. Attempted log rolling with flat bed but pt unable to tolerate    Transfers Overall transfer level: Independent Equipment used: None             General transfer comment: Independent without use of AD, no safety concerns or LOB    Balance Overall balance assessment: No apparent balance deficits (not formally assessed)                                         ADL either performed or assessed with clinical judgement   ADL Overall ADL's : Modified independent                                       General ADL Comments: Pt Modified Independent with LB dressing, mobility without AD, gathering items in room. No LOB or safety concerns     Vision Baseline Vision/History: Wears glasses Wears Glasses: At all times Patient Visual Report: No change from baseline Vision Assessment?: No apparent visual deficits     Perception     Praxis      Pertinent Vitals/Pain Pain Assessment: No/denies pain     Hand Dominance Right   Extremity/Trunk Assessment Upper Extremity Assessment Upper Extremity Assessment: RUE deficits/detail;LUE deficits/detail RUE Deficits / Details: Decreased sensation in digits RUE Sensation: decreased proprioception;decreased light touch LUE  Deficits / Details: decreased sensation in fingers LUE Sensation: decreased light touch;decreased proprioception   Lower Extremity Assessment Lower Extremity Assessment: Defer to PT evaluation   Cervical / Trunk Assessment Cervical / Trunk Assessment: Normal   Communication Communication Communication: No difficulties   Cognition Arousal/Alertness: Awake/alert Behavior During Therapy: WFL for tasks assessed/performed Overall Cognitive Status: Within Functional  Limits for tasks assessed                                     General Comments  Pt reports unable to use bed controls (not working), OT assessed and noted her bed was not plugged into wall which resolved issue and pt able to independently reposition self in bed now. Pt reports previously wearing cervical collar for 7 weeks, understands precautions and limitations. Discussed home setup, safety with ADLs, precautions during tasks with pt demo understanding. No safety concerns noted. Educated pt that she can remove hard collar for showering but pt hesistant to remove and plans to order second hard collar to use in shower.    Exercises     Shoulder Instructions      Home Living Family/patient expects to be discharged to:: Private residence Living Arrangements: Alone Available Help at Discharge: Family;Available PRN/intermittently Type of Home: House Home Access: Stairs to enter [employer] of Steps: 3 Entrance Stairs-Rails: Right Home Layout: One level     Bathroom Shower/Tub: Producer, television/film/video: Handicapped height     Home Equipment: Environmental consultant - 2 wheels;Cane - single point;Shower seat - built in          Prior Functioning/Environment Level of Independence: Independent        Comments: Pt Independent in all daily tasks without use of AD. Pt very active and walks 2 miles daily        OT Problem List:        OT Treatment/Interventions:      OT Goals(Current goals can be found in the care plan section) Acute Rehab OT Goals Patient Stated Goal: go home when ready OT Goal Formulation: All assessment and education complete, DC therapy  OT Frequency:     Barriers to D/C:            Co-evaluation              AM-PAC OT "6 Clicks" Daily Activity     Outcome Measure Help from another person eating meals?: None Help from another person taking care of personal grooming?: None Help from another person toileting, which includes  using toliet, bedpan, or urinal?: None Help from another person bathing (including washing, rinsing, drying)?: None Help from another person to put on and taking off regular upper body clothing?: None Help from another person to put on and taking off regular lower body clothing?: None 6 Click Score: 24   End of Session Equipment Utilized During Treatment: Cervical collar Nurse Communication: Mobility status;Other (comment) (OT fixed bed)  Activity Tolerance: Patient tolerated treatment well Patient left: in bed;with call bell/phone within reach                   Time: 0802-0832 OT Time Calculation (min): 30 min Charges:  OT General Charges $OT Visit: 1 Visit OT Evaluation $OT Eval Low Complexity: 1 Low OT Treatments $Self Care/Home Management : 8-22 mins  Lorre Munroe, OTR/L  Lorre Munroe 03/31/2020, 8:47 AM

## 2020-04-01 ENCOUNTER — Inpatient Hospital Stay (HOSPITAL_COMMUNITY): Admission: RE | Admit: 2020-04-01 | Payer: PPO | Source: Home / Self Care | Admitting: Neurosurgery

## 2020-04-01 ENCOUNTER — Inpatient Hospital Stay (HOSPITAL_COMMUNITY): Payer: PPO | Admitting: Anesthesiology

## 2020-04-01 ENCOUNTER — Encounter (HOSPITAL_COMMUNITY)
Admission: RE | Disposition: A | Discharge status: Home Health | Payer: Self-pay | Source: Home / Self Care | Attending: Neurosurgery

## 2020-04-01 ENCOUNTER — Encounter (HOSPITAL_COMMUNITY): Payer: Self-pay | Admitting: Neurosurgery

## 2020-04-01 ENCOUNTER — Inpatient Hospital Stay (HOSPITAL_COMMUNITY): Payer: PPO

## 2020-04-01 DIAGNOSIS — M4712 Other spondylosis with myelopathy, cervical region: Secondary | ICD-10-CM | POA: Diagnosis present

## 2020-04-01 HISTORY — PX: POSTERIOR CERVICAL FUSION/FORAMINOTOMY: SHX5038

## 2020-04-01 LAB — GLUCOSE, CAPILLARY
Glucose-Capillary: 158 mg/dL — ABNORMAL HIGH (ref 70–99)
Glucose-Capillary: 149 mg/dL — ABNORMAL HIGH (ref 70–99)
Glucose-Capillary: 183 mg/dL — ABNORMAL HIGH (ref 70–99)
Glucose-Capillary: 301 mg/dL — ABNORMAL HIGH (ref 70–99)

## 2020-04-01 SURGERY — POSTERIOR CERVICAL FUSION/FORAMINOTOMY LEVEL 2
Anesthesia: General | Site: Neck

## 2020-04-01 MED ORDER — BUPIVACAINE-EPINEPHRINE (PF) 0.5% -1:200000 IJ SOLN
INTRAMUSCULAR | Status: DC | PRN
  Administered 2020-04-01: 10 mL

## 2020-04-01 MED ORDER — 0.9 % SODIUM CHLORIDE (POUR BTL) OPTIME
TOPICAL | Status: DC | PRN
  Administered 2020-04-01: 1000 mL

## 2020-04-01 MED ORDER — INSULIN ASPART 100 UNIT/ML ~~LOC~~ SOLN
0.0000 [IU] | Freq: Every day | SUBCUTANEOUS | Status: DC
  Administered 2020-04-01: 4 [IU] via SUBCUTANEOUS

## 2020-04-01 MED ORDER — ONDANSETRON HCL 4 MG/2ML IJ SOLN: 4.0000 mg | Freq: Once | INTRAMUSCULAR | Status: DC | PRN

## 2020-04-01 MED ORDER — PHENYLEPHRINE 40 MCG/ML (10ML) SYRINGE FOR IV PUSH (FOR BLOOD PRESSURE SUPPORT)
PREFILLED_SYRINGE | INTRAVENOUS | Status: DC | PRN
  Administered 2020-04-01 (×2): 80 ug via INTRAVENOUS
  Administered 2020-04-01: 120 ug via INTRAVENOUS

## 2020-04-01 MED ORDER — CHLORHEXIDINE GLUCONATE 0.12 % MT SOLN: 15.0000 mL | Freq: Once | OROMUCOSAL | Status: AC

## 2020-04-01 MED ORDER — PROPOFOL 10 MG/ML IV BOLUS
INTRAVENOUS | Status: DC | PRN
  Administered 2020-04-01: 120 mg via INTRAVENOUS

## 2020-04-01 MED ORDER — LACTATED RINGERS IV SOLN: INTRAVENOUS | Status: DC

## 2020-04-01 MED ORDER — BACITRACIN ZINC 500 UNIT/GM EX OINT
TOPICAL_OINTMENT | CUTANEOUS | Status: DC | PRN
  Administered 2020-04-01: 1 via TOPICAL

## 2020-04-01 MED ORDER — LIDOCAINE 2% (20 MG/ML) 5 ML SYRINGE
INTRAMUSCULAR | Status: DC | PRN
  Administered 2020-04-01: 60 mg via INTRAVENOUS

## 2020-04-01 MED ORDER — SUCCINYLCHOLINE CHLORIDE 200 MG/10ML IV SOSY
PREFILLED_SYRINGE | INTRAVENOUS | Status: DC | PRN
  Administered 2020-04-01: 80 mg via INTRAVENOUS

## 2020-04-01 MED ORDER — BUPIVACAINE LIPOSOME 1.3 % IJ SUSP
INTRAMUSCULAR | Status: DC | PRN
  Administered 2020-04-01: 20 mL

## 2020-04-01 MED ORDER — HEMOSTATIC AGENTS (NO CHARGE) OPTIME
TOPICAL | Status: DC | PRN
  Administered 2020-04-01: 1 via TOPICAL

## 2020-04-01 MED ORDER — DEXAMETHASONE SODIUM PHOSPHATE 10 MG/ML IJ SOLN
INTRAMUSCULAR | Status: DC | PRN
  Administered 2020-04-01: 10 mg via INTRAVENOUS

## 2020-04-01 MED ORDER — CEFAZOLIN SODIUM-DEXTROSE 2-4 GM/100ML-% IV SOLN
2.0000 g | INTRAVENOUS | Status: AC
  Administered 2020-04-01: 2 g via INTRAVENOUS
  Filled 2020-04-01: qty 100

## 2020-04-01 MED ORDER — FENTANYL CITRATE (PF) 250 MCG/5ML IJ SOLN
INTRAMUSCULAR | Status: DC | PRN
  Administered 2020-04-01 (×3): 50 ug via INTRAVENOUS
  Administered 2020-04-01: 100 ug via INTRAVENOUS

## 2020-04-01 MED ORDER — SUGAMMADEX SODIUM 200 MG/2ML IV SOLN
INTRAVENOUS | Status: DC | PRN
  Administered 2020-04-01: 200 mg via INTRAVENOUS

## 2020-04-01 MED ORDER — LACTATED RINGERS IV SOLN: INTRAVENOUS | Status: DC | PRN

## 2020-04-01 MED ORDER — ACETAMINOPHEN 500 MG PO TABS
1000.0000 mg | ORAL_TABLET | Freq: Once | ORAL | Status: AC
  Administered 2020-04-01: 1000 mg via ORAL
  Filled 2020-04-01: qty 2

## 2020-04-01 MED ORDER — FENTANYL CITRATE (PF) 100 MCG/2ML IJ SOLN
25.0000 ug | INTRAMUSCULAR | Status: DC | PRN
  Administered 2020-04-01: 25 ug via INTRAVENOUS

## 2020-04-01 MED ORDER — FENTANYL CITRATE (PF) 100 MCG/2ML IJ SOLN
INTRAMUSCULAR | Status: AC
  Administered 2020-04-01: 25 ug via INTRAVENOUS
  Filled 2020-04-01: qty 2

## 2020-04-01 MED ORDER — INSULIN ASPART 100 UNIT/ML ~~LOC~~ SOLN
0.0000 [IU] | Freq: Three times a day (TID) | SUBCUTANEOUS | Status: DC
  Administered 2020-04-02: 5 [IU] via SUBCUTANEOUS

## 2020-04-01 MED ORDER — ONDANSETRON HCL 4 MG/2ML IJ SOLN
INTRAMUSCULAR | Status: DC | PRN
  Administered 2020-04-01: 4 mg via INTRAVENOUS

## 2020-04-01 MED ORDER — THROMBIN 5000 UNITS EX SOLR: OROMUCOSAL | Status: DC | PRN

## 2020-04-01 MED ORDER — BUPIVACAINE LIPOSOME 1.3 % IJ SUSP
20.0000 mL | Freq: Once | INTRAMUSCULAR | Status: DC
  Filled 2020-04-01: qty 20

## 2020-04-01 MED ORDER — CHLORHEXIDINE GLUCONATE 0.12 % MT SOLN
OROMUCOSAL | Status: AC
  Administered 2020-04-01: 15 mL via OROMUCOSAL
  Filled 2020-04-01: qty 15

## 2020-04-01 MED ORDER — ROCURONIUM BROMIDE 10 MG/ML (PF) SYRINGE
PREFILLED_SYRINGE | INTRAVENOUS | Status: DC | PRN
  Administered 2020-04-01: 60 mg via INTRAVENOUS
  Administered 2020-04-01: 40 mg via INTRAVENOUS

## 2020-04-01 MED ORDER — GLYCOPYRROLATE PF 0.2 MG/ML IJ SOSY
PREFILLED_SYRINGE | INTRAMUSCULAR | Status: DC | PRN
  Administered 2020-04-01 (×2): .1 mg via INTRAVENOUS

## 2020-04-01 SURGICAL SUPPLY — 75 items
BAND RUBBER #18 3X1/16 STRL (MISCELLANEOUS) IMPLANT
BENZOIN TINCTURE PRP APPL 2/3 (GAUZE/BANDAGES/DRESSINGS) ×3 IMPLANT
BIT DRILL NEURO 2X3.1 SFT TUCH (MISCELLANEOUS) ×1 IMPLANT
BIT DRILL OCT ADJ 2.3 (BIT) ×3 IMPLANT
BLADE CLIPPER SURG (BLADE) ×3 IMPLANT
BLADE ULTRA TIP 2M (BLADE) IMPLANT
CANISTER SUCT 3000ML PPV (MISCELLANEOUS) ×3 IMPLANT
CAP CLSR POST CERV (Cap) ×24 IMPLANT
CARTRIDGE OIL MAESTRO DRILL (MISCELLANEOUS) ×1 IMPLANT
CLOSURE WOUND 1/2 X4 (GAUZE/BANDAGES/DRESSINGS) ×1
COVER WAND RF STERILE (DRAPES) IMPLANT
DIFFUSER DRILL AIR PNEUMATIC (MISCELLANEOUS) ×3 IMPLANT
DRAPE C-ARM 42X72 X-RAY (DRAPES) ×6 IMPLANT
DRAPE LAPAROTOMY 100X72 PEDS (DRAPES) ×3 IMPLANT
DRAPE MICROSCOPE LEICA (MISCELLANEOUS) IMPLANT
DRAPE SURG 17X23 STRL (DRAPES) ×6 IMPLANT
DRILL NEURO 2X3.1 SOFT TOUCH (MISCELLANEOUS) ×3
DRSG OPSITE POSTOP 4X6 (GAUZE/BANDAGES/DRESSINGS) ×3 IMPLANT
ELECT BLADE 4.0 EZ CLEAN MEGAD (MISCELLANEOUS) ×3
ELECT REM PT RETURN 9FT ADLT (ELECTROSURGICAL) ×3
ELECTRODE BLDE 4.0 EZ CLN MEGD (MISCELLANEOUS) ×1 IMPLANT
ELECTRODE REM PT RTRN 9FT ADLT (ELECTROSURGICAL) ×1 IMPLANT
GAUZE 4X4 16PLY RFD (DISPOSABLE) IMPLANT
GAUZE SPONGE 4X4 12PLY STRL (GAUZE/BANDAGES/DRESSINGS) IMPLANT
GLOVE BIO SURGEON STRL SZ 6.5 (GLOVE) ×2 IMPLANT
GLOVE BIO SURGEON STRL SZ8 (GLOVE) ×3 IMPLANT
GLOVE BIO SURGEON STRL SZ8.5 (GLOVE) ×3 IMPLANT
GLOVE BIO SURGEONS STRL SZ 6.5 (GLOVE) ×1
GLOVE BIOGEL PI IND STRL 6.5 (GLOVE) ×1 IMPLANT
GLOVE BIOGEL PI IND STRL 7.5 (GLOVE) ×1 IMPLANT
GLOVE BIOGEL PI IND STRL 8 (GLOVE) ×4 IMPLANT
GLOVE BIOGEL PI INDICATOR 6.5 (GLOVE) ×2
GLOVE BIOGEL PI INDICATOR 7.5 (GLOVE) ×2
GLOVE BIOGEL PI INDICATOR 8 (GLOVE) ×8
GLOVE ECLIPSE 7.5 STRL STRAW (GLOVE) ×9 IMPLANT
GLOVE EXAM NITRILE XL STR (GLOVE) IMPLANT
GOWN STRL REUS W/ TWL LRG LVL3 (GOWN DISPOSABLE) ×1 IMPLANT
GOWN STRL REUS W/ TWL XL LVL3 (GOWN DISPOSABLE) ×1 IMPLANT
GOWN STRL REUS W/TWL 2XL LVL3 (GOWN DISPOSABLE) ×6 IMPLANT
GOWN STRL REUS W/TWL LRG LVL3 (GOWN DISPOSABLE) ×2
GOWN STRL REUS W/TWL XL LVL3 (GOWN DISPOSABLE) ×2
GRAFT BONE PROTEIOS SM 1CC (Orthopedic Implant) ×3 IMPLANT
HEMOSTAT POWDER KIT SURGIFOAM (HEMOSTASIS) ×3 IMPLANT
KIT BASIN OR (CUSTOM PROCEDURE TRAY) ×3 IMPLANT
KIT TURNOVER KIT B (KITS) ×3 IMPLANT
NEEDLE HYPO 18GX1.5 BLUNT FILL (NEEDLE) ×3 IMPLANT
NEEDLE HYPO 21X1.5 SAFETY (NEEDLE) ×3 IMPLANT
NEEDLE HYPO 22GX1.5 SAFETY (NEEDLE) ×3 IMPLANT
NEEDLE SPNL 18GX3.5 QUINCKE PK (NEEDLE) ×3 IMPLANT
NS IRRIG 1000ML POUR BTL (IV SOLUTION) ×3 IMPLANT
OIL CARTRIDGE MAESTRO DRILL (MISCELLANEOUS) ×3
PACK LAMINECTOMY NEURO (CUSTOM PROCEDURE TRAY) ×3 IMPLANT
PAD ARMBOARD 7.5X6 YLW CONV (MISCELLANEOUS) ×9 IMPLANT
PIN MAYFIELD SKULL DISP (PIN) ×3 IMPLANT
PUTTY DBM 5CC CALC GRAN ×3 IMPLANT
ROD VIRAGE 3.5X50MM (Rod) ×3 IMPLANT
ROD VIRAGE 3.5X60 (Rod) ×3 IMPLANT
SCREW VIRAGE 3.5X14 (Screw) ×24 IMPLANT
SPONGE LAP 4X18 RFD (DISPOSABLE) IMPLANT
SPONGE SURGIFOAM ABS GEL 100 (HEMOSTASIS) IMPLANT
SPONGE SURGIFOAM ABS GEL 12-7 (HEMOSTASIS) IMPLANT
SPONGE SURGIFOAM ABS GEL SZ50 (HEMOSTASIS) ×3 IMPLANT
STAPLER SKIN PROX WIDE 3.9 (STAPLE) IMPLANT
STRIP CLOSURE SKIN 1/2X4 (GAUZE/BANDAGES/DRESSINGS) ×2 IMPLANT
SUT ETHILON 2 0 FS 18 (SUTURE) IMPLANT
SUT VIC AB 0 CT1 18XCR BRD8 (SUTURE) ×2 IMPLANT
SUT VIC AB 0 CT1 8-18 (SUTURE) ×4
SUT VIC AB 2-0 CP2 18 (SUTURE) ×6 IMPLANT
SYR 20ML LL LF (SYRINGE) ×3 IMPLANT
SYR 3ML LL SCALE MARK (SYRINGE) ×3 IMPLANT
TOWEL GREEN STERILE (TOWEL DISPOSABLE) ×3 IMPLANT
TOWEL GREEN STERILE FF (TOWEL DISPOSABLE) ×3 IMPLANT
TRAY FOLEY MTR SLVR 16FR STAT (SET/KITS/TRAYS/PACK) IMPLANT
UNDERPAD 30X36 HEAVY ABSORB (UNDERPADS AND DIAPERS) IMPLANT
WATER STERILE IRR 1000ML POUR (IV SOLUTION) ×3 IMPLANT

## 2020-04-01 NOTE — Progress Notes (Signed)
Subjective: The patient is alert and pleasant.  She complains of posterior cervical soreness.  She has some weakness in her left shoulder.  Objective: Vital signs in last 24 hours: Temp:  [98.3 F (36.8 C)-99.6 F (37.6 C)] 98.7 F (37.1 C) (12/01 1136) Pulse Rate:  [89-99] 93 (12/01 1136) Resp:  [18-19] 18 (12/01 1136) BP: (123-162)/(53-65) 162/62 (12/01 1136) SpO2:  [93 %-96 %] 96 % (12/01 1136) Weight:  [63.7 kg] 63.7 kg (12/01 1228) Estimated body mass index is 24.89 kg/m as calculated from the following:   Height as of this encounter: 5' 2.99" (1.6 m).   Weight as of this encounter: 63.7 kg.   Intake/Output from previous day: No intake/output data recorded. Intake/Output this shift: No intake/output data recorded.  Physical exam the patient is alert and pleasant.  Her dressing is clean and dry.  She has some weakness in her left deltoid at 4/5.  Lab Results: Recent Labs    03/31/20 0609  WBC 11.1*  HGB 11.2*  HCT 32.3*  PLT 243   BMET Recent Labs    03/31/20 0609  NA 126*  K 3.8  CL 90*  CO2 23  GLUCOSE 137*  BUN 13  CREATININE 0.81  CALCIUM 8.8*    Studies/Results: No results found.  Assessment/Plan: Status post corpectomy: I have answered all the patient's questions regarding a posterior cervical instrumentation fusion.  She has decided proceed with surgery.  LOS: 2 days     Jessica Wheeler 04/01/2020, 2:55 PM

## 2020-04-01 NOTE — Op Note (Signed)
Brief history: The patient is an 79 year old white female who presented with a progressive cervical myelopathy.  She was worked up with a cervical MRI and cervical CT which demonstrated findings consistent with ossification of the posterior longitudinal ligament, cervical spondylosis, cervical myelopathy.  I discussed the various treatment options.  She decided proceed with surgery.  On 03/30/2020 I performed a C4 and C5 corpectomy with anterior instrumentation and fusion.  This is the second stage of surgery consisting of a posterior cervical instrumentation and fusion.  Preop diagnosis: Cervical spondylosis, cervical myelopathy, cervical spinal stenosis, ossification of posterior longitudinal ligament, cervicalgia, cervical radiculopathy  Postop diagnosis: Same  Procedure: Posterior C3-4, C4-5, C5-6 arthrodesis with local morselized autograft bone, Zimmer bone graft extender, and ProteOs; posterior segmental cervical instrumentation C3-C6 bilaterally with Zimmer titanium lateral mass screws and rods  Surgeon: Dr. Delma Officer  Assistant: Hildred Priest, NP  Anesthesia: General endotracheal  Estimated blood loss: 100 cc  Specimens: None  Drains: None  Complications: None  Description of procedure: The patient was brought to the operating room by the anesthesia team.  General endotracheal anesthesia was induced.  I applied the Mayfield three-point headrest to the patient's calvarium.  She was carefully turned to the prone position on the chest rolls.  Good neutral position was confirmed with fluoroscopy.  The patient's suboccipital region was then shaved with clippers and this region in the posterior cervical and upper thoracic region was prepared with Betadine scrub and Betadine solution.  Sterile drapes were applied.  I then injected the area to be incised with Marcaine with epinephrine solution.  I scalpel to make a linear midline incision from C3-4 to C5-6.  I used electrocautery to perform  a bilateral subperiosteal dissection exposing the spinous process lamina facets and lateral masses at C3-4, C4-5 and C5-6.  We confirmed our location with intraoperative fluoroscopy.  We inserted the [facility] retractor for exposure.  I exposed the lateral masses at C3, C4, C5 and C6 bilaterally with electrocautery.  We then identified the center of the lateral mass and using the 14 mm drill guide we drilled a pilot hole into the lateral mass under fluoroscopic guidance aiming cephalad and laterally.  We then probed inside this drill hole and ruled out cortical breaches.  We then inserted 14 mm polyaxial lateral mass titanium screws into the lateral masses bilaterally at C3, C4, C5 and C6.  We got good bony purchase.  We then connected the unilateral screws with a lordotic rod.  We secured the rod in place with the caps which completed the instrumentation bilaterally from C3-C6.  We now turned our attention to the posterior lateral arthrodesis.  We used a high-speed drill to decorticate the pars, lateral lamina, facets bilaterally at C3-4, C4-5 and C5-6.  We then laid a combination of ProteaOS soaked Gelfoam sponges, intergrow bone graft extender, and some local autograft bone we obtained during the drilling over these decorticated posterior lateral structures completing the posterior lateral arthrodesis at C3-4, C4-5 and C5-6.  We then obtained hemostasis with bipolar cautery.  We remove the retractors.  We injected Exparel into the soft tissue.  We then reapproximated patient's cervical thoracic fascia with interrupted 0 Vicryl suture.  We reapproximated the subcutaneous tissue with interrupted 0 Vicryl suture.  We reapproximated the skin with Steri-Strips and benzoin.  The wound was then coated with bacitracin ointment.  A sterile dressing was applied.  The drapes were removed.  The patient was subsequently returned to the supine position.  I  then removed the Mayfield three-point headrest from her  calvarium.  By report all sponge, instrument, and needle counts were correct at the end this case.

## 2020-04-01 NOTE — Anesthesia Preprocedure Evaluation (Addendum)
Anesthesia Evaluation  Patient identified by MRN, date of birth, ID band Patient awake    Reviewed: Allergy & Precautions, NPO status , Patient's Chart, lab work & pertinent test results  Airway Mallampati: II  TM Distance: >3 FB Neck ROM: Limited    Dental  (+) Edentulous Upper,    Pulmonary neg pulmonary ROS, former smoker,    Pulmonary exam normal breath sounds clear to auscultation       Cardiovascular hypertension, Pt. on medications + CAD and + Peripheral Vascular Disease (aortic dissection (2 cm weblike area within the distal aorta likely representing dissection 06/05/08 CTA, "healed" by follow-up imaging))  Normal cardiovascular exam+ dysrhythmias (RBBB)  Rhythm:Regular Rate:Normal  Preoperative cardiology input outlined on 02/24/2020 by Marjie Skiff, PA-C: "Per Dr. Mariah Milling, she is at acceptable risk for surgery without any further cardiovascular testing."   Neuro/Psych PSYCHIATRIC DISORDERS Anxiety Depression Cervical spondylosis with myelopathy and radiculopathy POD2 s/p ACDF with C-collar in place  Neuromuscular disease    GI/Hepatic negative GI ROS, Neg liver ROS,   Endo/Other  diabetes, Type 2, Oral Hypoglycemic Agents  Renal/GU negative Renal ROS     Musculoskeletal  (+) Arthritis ,   Abdominal   Peds  Hematology  (+) Blood dyscrasia, anemia ,   Anesthesia Other Findings Day of surgery medications reviewed with the patient.  Reproductive/Obstetrics                           Anesthesia Physical Anesthesia Plan  ASA: III  Anesthesia Plan: General   Post-op Pain Management:    Induction: Intravenous  PONV Risk Score and Plan: 3 and Ondansetron, Dexamethasone and Treatment may vary due to age or medical condition  Airway Management Planned: Oral ETT and Video Laryngoscope Planned  Additional Equipment:   Intra-op Plan:   Post-operative Plan: Extubation in  OR  Informed Consent: I have reviewed the patients History and Physical, chart, labs and discussed the procedure including the risks, benefits and alternatives for the proposed anesthesia with the patient or authorized representative who has indicated his/her understanding and acceptance.       Plan Discussed with: CRNA  Anesthesia Plan Comments:         Anesthesia Quick Evaluation

## 2020-04-01 NOTE — Telephone Encounter (Signed)
Pt had surgery 11/29 not sure why sent back I am removing from pool

## 2020-04-01 NOTE — Progress Notes (Signed)
Subjective: The patient is alert and pleasant.  She is in no apparent distress.  Objective: Vital signs in last 24 hours: Temp:  [98.3 F (36.8 C)-99.6 F (37.6 C)] 98.7 F (37.1 C) (12/01 1136) Pulse Rate:  [89-99] 93 (12/01 1136) Resp:  [18] 18 (12/01 1136) BP: (123-162)/(55-65) 162/62 (12/01 1136) SpO2:  [93 %-96 %] 96 % (12/01 1136) Weight:  [63.7 kg] 63.7 kg (12/01 1228) Estimated body mass index is 24.89 kg/m as calculated from the following:   Height as of this encounter: 5' 2.99" (1.6 m).   Weight as of this encounter: 63.7 kg.   Intake/Output from previous day: No intake/output data recorded. Intake/Output this shift: Total I/O In: 1100 [I.V.:1100] Out: 25 [Blood:25]  Physical exam the patient is alert and pleasant.  She is moving all 4 extremities well.  Lab Results: Recent Labs    03/31/20 0609  WBC 11.1*  HGB 11.2*  HCT 32.3*  PLT 243   BMET Recent Labs    03/31/20 0609  NA 126*  K 3.8  CL 90*  CO2 23  GLUCOSE 137*  BUN 13  CREATININE 0.81  CALCIUM 8.8*    Studies/Results: No results found.  Assessment/Plan: The patient is doing well.  I spoke with her daughter-in-law.  LOS: 2 days     Cristi Loron 04/01/2020, 5:33 PM

## 2020-04-01 NOTE — Anesthesia Procedure Notes (Signed)
Procedure Name: Intubation Date/Time: 04/01/2020 3:34 PM Performed by: Izola Price., CRNA Pre-anesthesia Checklist: Patient identified, Emergency Drugs available, Suction available and Patient being monitored Patient Re-evaluated:Patient Re-evaluated prior to induction Oxygen Delivery Method: Circle system utilized Preoxygenation: Pre-oxygenation with 100% oxygen Induction Type: IV induction Ventilation: Mask ventilation without difficulty and Oral airway inserted - appropriate to patient size Laryngoscope Size: Glidescope and 3 Grade View: Grade I Tube type: Oral Tube size: 7.0 mm Number of attempts: 1 Airway Equipment and Method: Stylet and Oral airway Placement Confirmation: ETT inserted through vocal cords under direct vision,  positive ETCO2 and breath sounds checked- equal and bilateral Secured at: 21 cm Tube secured with: Tape Dental Injury: Teeth and Oropharynx as per pre-operative assessment  Comments: Cervical collar in place, head and neck maintained midline and neutral alignment

## 2020-04-01 NOTE — Transfer of Care (Signed)
Immediate Anesthesia Transfer of Care Note  Patient: ZENITH KERCHEVAL  Procedure(s) Performed: POSTERIOR CERVICAL FUSION/FORAMINOTOMY CERVICAL FOUR- CERVICAL SIX (N/A Neck)  Patient Location: PACU  Anesthesia Type:General  Level of Consciousness: awake, alert  and oriented  Airway & Oxygen Therapy: Patient Spontanous Breathing and Patient connected to face mask oxygen  Post-op Assessment: Report given to RN and Post -op Vital signs reviewed and stable  Post vital signs: Reviewed and stable  Last Vitals:  Vitals Value Taken Time  BP 186/87 04/01/20 1718  Temp    Pulse 106 04/01/20 1720  Resp 17 04/01/20 1720  SpO2 96 % 04/01/20 1720  Vitals shown include unvalidated device data.  Last Pain:  Vitals:   04/01/20 1136  TempSrc: Oral  PainSc:       Patients Stated Pain Goal: 3 (04/01/20 0640)  Complications: No complications documented.

## 2020-04-02 LAB — GLUCOSE, CAPILLARY
Glucose-Capillary: 156 mg/dL — ABNORMAL HIGH (ref 70–99)
Glucose-Capillary: 129 mg/dL — ABNORMAL HIGH (ref 70–99)
Glucose-Capillary: 225 mg/dL — ABNORMAL HIGH (ref 70–99)
Glucose-Capillary: 164 mg/dL — ABNORMAL HIGH (ref 70–99)

## 2020-04-02 MED ORDER — MAGNESIUM CITRATE PO SOLN
1.0000 | Freq: Once | ORAL | Status: AC
  Administered 2020-04-02: 1 via ORAL
  Filled 2020-04-02: qty 296

## 2020-04-02 NOTE — Evaluation (Signed)
Physical Therapy Re-Evaluation Patient Details Name: Jessica Wheeler MRN: [id number] DOB: 17-Jan-1941 Today's Date: 04/02/2020   History of Present Illness  Patient is an 79 y/o female who has progressive cervical myelopathy and severe stenosis s/p ACDF C3-6 on 11/29 and posterior fusion on 12/1. PMH includes arthritis, RBBB, DM, aorta dissection, diverticulosis, glaucoma, HTN. Patient s/p ACDF C3-6 on 11/29.  Clinical Impression  Pt in bed upon arrival of PT, agreeable to evaluation at this time. Prior to admission the pt was independent with all mobility without need for AD and living alone. The pt now presents with minor limitations in functional mobility, and dynamic stability due to above dx, but will be safe to return home with intermittent support from family or other services when medically cleared. The pt was able to complete good bouts of hallway ambulation and stair navigation without assist, evidence of instability, or need for AD. The pt reports she feels she is at her baseline level of mobility, and has done significant preparation for surgery at home, setting up services for home care and food. The pt may benefit from OPPT to address targeted strengthening and return of ROM when cleared at follow-up, but is safe to return home without therapy services at this time.       Follow Up Recommendations No PT follow up (May benefit from OPPT when cleared at follow-up)    Equipment Recommendations  None recommended by PT    Recommendations for Other Services       Precautions / Restrictions Precautions Precautions: Cervical Precaution Booklet Issued: Yes (comment) Required Braces or Orthoses: Cervical Brace Cervical Brace: Hard collar;At all times;Other (comment) (can be removed for showering) Restrictions Weight Bearing Restrictions: No      Mobility  Bed Mobility Overal bed mobility: Needs Assistance Bed Mobility: Supine to Sit;Sit to Supine     Supine to sit: Modified  independent (Device/Increase time);HOB elevated Sit to supine: Modified independent (Device/Increase time);HOB elevated   General bed mobility comments: modified independent with HOB elevated, pt states she sleeps on wedge of unknown height.     Transfers Overall transfer level: Independent Equipment used: None             General transfer comment: Pt able to complete without AD or assist, no evidence of instability with sit-stands in session  Ambulation/Gait Ambulation/Gait assistance: Supervision Gait Distance (Feet): 250 Feet Assistive device: None Gait Pattern/deviations: WFL(Within Functional Limits) Gait velocity: 0.8 m/s Gait velocity interpretation: 1.31 - 2.62 ft/sec, indicative of limited community ambulator General Gait Details: supervision during session for safety, no evidence of instability.   Stairs Stairs: Yes Stairs assistance: Supervision Stair Management: One rail Right;Sideways Number of Stairs: 3 General stair comments: patient states she was negotiating stairs sideways previously with the [place] collar to be able to see her feet.      Balance Overall balance assessment: No apparent balance deficits (not formally assessed)                                           Pertinent Vitals/Pain Pain Assessment: No/denies pain    Home Living Family/patient expects to be discharged to:: Private residence Living Arrangements: Alone Available Help at Discharge: Family;Available PRN/intermittently Type of Home: House Home Access: Stairs to enter Entrance Stairs-Rails: Right Entrance Stairs-Number of Steps: 3 Home Layout: One level Home Equipment: Walker - 2 wheels;Cane - single point;Shower seat -  built in Additional Comments: home is completely handicapped accessible    Prior Function Level of Independence: Independent         Comments: Pt Independent in all daily tasks without use of AD. Pt very active and walks 2 miles daily      Hand Dominance   Dominant Hand: Right    Extremity/Trunk Assessment   Upper Extremity Assessment Upper Extremity Assessment: Defer to OT evaluation (pt states decreased sensation in BUE hands) RUE Deficits / Details: Decreased sensation in digits RUE Sensation: decreased proprioception;decreased light touch LUE Deficits / Details: decreased sensation in fingers LUE Sensation: decreased light touch;decreased proprioception    Lower Extremity Assessment Lower Extremity Assessment: Overall WFL for tasks assessed (pt denies any difference in sensation)    Cervical / Trunk Assessment Cervical / Trunk Assessment: Other exceptions Cervical / Trunk Exceptions: s/p cervical surgery  Communication   Communication: No difficulties  Cognition Arousal/Alertness: Awake/alert Behavior During Therapy: WFL for tasks assessed/performed Overall Cognitive Status: Within Functional Limits for tasks assessed                                        General Comments General comments (skin integrity, edema, etc.): Pt able to verbalize all cervical precautions and demonstrates good preparation and safety awareness with d/c home    Exercises     Assessment/Plan    PT Assessment Patent does not need any further PT services  PT Problem List Decreased strength;Decreased balance;Decreased coordination;Decreased mobility       PT Treatment Interventions      PT Goals (Current goals can be found in the Care Plan section)  Acute Rehab PT Goals Patient Stated Goal: To return home independently  PT Goal Formulation: With patient Time For Goal Achievement: 04/16/20 Potential to Achieve Goals: Good     AM-PAC PT "6 Clicks" Mobility  Outcome Measure Help needed turning from your back to your side while in a flat bed without using bedrails?: A Little Help needed moving from lying on your back to sitting on the side of a flat bed without using bedrails?: A Little Help needed  moving to and from a bed to a chair (including a wheelchair)?: None Help needed standing up from a chair using your arms (e.g., wheelchair or bedside chair)?: None Help needed to walk in hospital room?: None Help needed climbing 3-5 steps with a railing? : None 6 Click Score: 22    End of Session Equipment Utilized During Treatment: Gait belt;Cervical collar Activity Tolerance: Patient tolerated treatment well Patient left: in bed;with call bell/phone within reach;with family/visitor present Nurse Communication: Mobility status PT Visit Diagnosis: Muscle weakness (generalized) (M62.81)    Time: 6063-0160 PT Time Calculation (min) (ACUTE ONLY): 34 min   Charges:   PT Evaluation $PT Re-evaluation: 1 Re-eval PT Treatments $Self Care/Home Management: 8-22        Deland Pretty, DPT   Acute Rehabilitation Department Pager #: [phone]  Gaetana Michaelis 04/02/2020, 9:09 AM

## 2020-04-02 NOTE — Progress Notes (Signed)
Patient ID: Jessica Wheeler, female   DOB: 12-07-1940, 79 y.o.   MRN: [id number] Subjective: The patient is alert and pleasant and appropriately sore.  Objective: Vital signs in last 24 hours: Temp:  [97.6 F (36.4 C)-98.7 F (37.1 C)] 98 F (36.7 C) (12/02 0745) Pulse Rate:  [83-108] 83 (12/02 0745) Resp:  [16-25] 16 (12/02 0745) BP: (132-186)/(61-87) 166/71 (12/02 0745) SpO2:  [92 %-100 %] 98 % (12/02 0745) Weight:  [63.7 kg] 63.7 kg (12/01 1228) Estimated body mass index is 24.89 kg/m as calculated from the following:   Height as of this encounter: 5' 2.99" (1.6 m).   Weight as of this encounter: 63.7 kg.   Intake/Output from previous day: 12/01 0701 - 12/02 0700 In: 1100 [I.V.:1100] Out: 25 [Blood:25] Intake/Output this shift: No intake/output data recorded.  Physical exam the patient is alert and oriented.  She is moving all 4 extremities well.  Lab Results: Recent Labs    03/31/20 0609  WBC 11.1*  HGB 11.2*  HCT 32.3*  PLT 243   BMET Recent Labs    03/31/20 0609  NA 126*  K 3.8  CL 90*  CO2 23  GLUCOSE 137*  BUN 13  CREATININE 0.81  CALCIUM 8.8*    Studies/Results: DG Cervical Spine 1 View  Result Date: 04/01/2020 CLINICAL DATA:  Surgery, elective. Additional history provided: Posterior cervical fusion/foraminotomy cervical 4-cervical 6. Provided fluoroscopy time 25 seconds (2.13 mGy). EXAM: DG CERVICAL SPINE - 1 VIEW; DG C-ARM 1-60 MIN COMPARISON:  Radiographs of the cervical spine 03/30/2020. FINDINGS: A single lateral view intraoperative fluoroscopic image of the cervical spine is submitted. Redemonstrated corpectomy prosthesis and anterior fusion hardware spanning the C3-C6 levels. New from the prior exam of 03/30/2020, there are lateral mass screws at C3, C4, C5 and C6. Vertical interconnecting rods were not present at the time the image was taken. Overlying retractors. Partially visualized support tubes. IMPRESSION: Single lateral view intraoperative  fluoroscopic image of the cervical spine, as described. Electronically Signed   By: Jackey Loge DO   On: 04/01/2020 17:44   DG C-Arm 1-60 Min  Result Date: 04/01/2020 CLINICAL DATA:  Surgery, elective. Additional history provided: Posterior cervical fusion/foraminotomy cervical 4-cervical 6. Provided fluoroscopy time 25 seconds (2.13 mGy). EXAM: DG CERVICAL SPINE - 1 VIEW; DG C-ARM 1-60 MIN COMPARISON:  Radiographs of the cervical spine 03/30/2020. FINDINGS: A single lateral view intraoperative fluoroscopic image of the cervical spine is submitted. Redemonstrated corpectomy prosthesis and anterior fusion hardware spanning the C3-C6 levels. New from the prior exam of 03/30/2020, there are lateral mass screws at C3, C4, C5 and C6. Vertical interconnecting rods were not present at the time the image was taken. Overlying retractors. Partially visualized support tubes. IMPRESSION: Single lateral view intraoperative fluoroscopic image of the cervical spine, as described. Electronically Signed   By: Jackey Loge DO   On: 04/01/2020 17:44    Assessment/Plan: Postop day #3/1: The patient is doing well.  We will continue to mobilize her with PT and OT.  We discussed spending some time with her son and daughter-in-law at their home.  She was not interested.  She wants to go home tomorrow.  We will arrange for home health services.  LOS: 3 days     Cristi Loron 04/02/2020, 8:33 AM

## 2020-04-02 NOTE — Evaluation (Signed)
Occupational Therapy Evaluation and Discharge  Patient Details Name: Jessica Wheeler MRN: [id number] DOB: [birth year] Today's Date: 04/02/2020    History of present illness Patient is an 79 y/o female who has progressive cervical myelopathy and severe stenosis s/p ACDF C3-6 on 11/29 and posterior fusion on 12/1. PMH includes arthritis, RBBB, DM, aorta dissection, diverticulosis, glaucoma, HTN. Patient s/p ACDF C3-6 on 11/29.   OT comments  Patient continues to demonstrate Mod I to I with bed mobility, ADL transfers, and functional mobility without AD. Patient with continued plan to return home independently with PRN assist from family and friends. Patient has prepared well for her return home and has meals prepared and frozen for easy reheating, ADL items stored at counter level, and bathing items prepared to increase safety and independence. Patient does not require continued acute occupational therapy services with OT to sign off at this time.    Follow Up Recommendations  No OT follow up    Equipment Recommendations  None recommended by OT    Recommendations for Other Services      Precautions / Restrictions Precautions Precautions: Cervical Precaution Booklet Issued: Yes (comment) Required Braces or Orthoses: Cervical Brace Cervical Brace: Hard collar;At all times;Other (comment) (can be removed for showering) Restrictions Weight Bearing Restrictions: No       Mobility Bed Mobility Overal bed mobility: Needs Assistance Bed Mobility: Supine to Sit;Sit to Supine     Supine to sit: Modified independent (Device/Increase time);HOB elevated Sit to supine: Modified independent (Device/Increase time);HOB elevated   General bed mobility comments: Modified Independent with HOB highly elevated. Pt reports sleeping on a wedge. Attempted log rolling with flat bed but pt unable to tolerate  Transfers Overall transfer level: Independent Equipment used: None             General  transfer comment: Patient ambulating in hallway with PT independently without AD.     Balance Overall balance assessment: No apparent balance deficits (not formally assessed)                                         ADL either performed or assessed with clinical judgement   ADL Overall ADL's : Modified independent                                       General ADL Comments: No change from previous eval on 11/30     Vision   Vision Assessment?: No apparent visual deficits   Perception     Praxis      Cognition Arousal/Alertness: Awake/alert Behavior During Therapy: WFL for tasks assessed/performed Overall Cognitive Status: Within Functional Limits for tasks assessed                                          Exercises     Shoulder Instructions       General Comments Patient able to recall education from previous OT eval. Patient also able to recall cervical precautions.     Pertinent Vitals/ Pain       Pain Assessment: No/denies pain  Home Living  Prior Functioning/Environment              Frequency           Progress Toward Goals  OT Goals(current goals can now be found in the care plan section)     Acute Rehab OT Goals Patient Stated Goal: To return home independently  OT Goal Formulation: All assessment and education complete, DC therapy  Plan Discharge plan remains appropriate    Co-evaluation                 AM-PAC OT "6 Clicks" Daily Activity     Outcome Measure   Help from another person eating meals?: None Help from another person taking care of personal grooming?: None Help from another person toileting, which includes using toliet, bedpan, or urinal?: None Help from another person bathing (including washing, rinsing, drying)?: None Help from another person to put on and taking off regular upper body clothing?: None Help  from another person to put on and taking off regular lower body clothing?: None 6 Click Score: 24    End of Session Equipment Utilized During Treatment: Cervical collar  OT Visit Diagnosis: Pain Pain - part of body:  (Cervical neck at incision)   Activity Tolerance Patient tolerated treatment well   Patient Left in bed;with call bell/phone within reach   Nurse Communication Mobility status        Time: 5732-2025 OT Time Calculation (min): 9 min  Charges: OT General Charges $OT Visit: 1 Visit OT Evaluation $OT Eval Low Complexity: 1 Low  Ghassan Coggeshall H. OTR/L Supplemental OT, Department of rehab services [phone]   Gladyse Corvin R H. 04/02/2020, 8:54 AM

## 2020-04-03 ENCOUNTER — Encounter (HOSPITAL_COMMUNITY): Payer: Self-pay | Admitting: Neurosurgery

## 2020-04-03 LAB — GLUCOSE, CAPILLARY
Glucose-Capillary: 166 mg/dL — ABNORMAL HIGH (ref 70–99)
Glucose-Capillary: 145 mg/dL — ABNORMAL HIGH (ref 70–99)

## 2020-04-03 MED ORDER — OXYCODONE-ACETAMINOPHEN 5-325 MG PO TABS
1.0000 | ORAL_TABLET | ORAL | Status: DC | PRN
  Administered 2020-04-03: 2 via ORAL
  Filled 2020-04-03: qty 2

## 2020-04-03 MED ORDER — OXYCODONE-ACETAMINOPHEN 5-325 MG PO TABS
1.0000 | ORAL_TABLET | ORAL | 0 refills | Status: DC | PRN
Start: 2020-04-03 — End: 2020-05-19

## 2020-04-03 MED ORDER — CYCLOBENZAPRINE HCL 5 MG PO TABS
5.0000 mg | ORAL_TABLET | Freq: Three times a day (TID) | ORAL | 0 refills | Status: DC | PRN
Start: 2020-04-03 — End: 2020-12-22

## 2020-04-03 MED ORDER — CYCLOBENZAPRINE HCL 5 MG PO TABS: 5.0000 mg | ORAL_TABLET | Freq: Three times a day (TID) | ORAL | Status: DC | PRN

## 2020-04-03 MED ORDER — DOCUSATE SODIUM 100 MG PO CAPS
100.0000 mg | ORAL_CAPSULE | Freq: Two times a day (BID) | ORAL | 0 refills | Status: DC
Start: 2020-04-03 — End: 2020-07-08

## 2020-04-03 NOTE — Anesthesia Postprocedure Evaluation (Signed)
Anesthesia Post Note  Patient: Jessica Wheeler  Procedure(s) Performed: POSTERIOR CERVICAL FUSION/FORAMINOTOMY CERVICAL FOUR- CERVICAL SIX (N/A Neck)     Patient location during evaluation: PACU Anesthesia Type: General Level of consciousness: awake Pain management: pain level controlled Vital Signs Assessment: post-procedure vital signs reviewed and stable Respiratory status: spontaneous breathing, nonlabored ventilation, respiratory function stable and patient connected to nasal cannula oxygen Cardiovascular status: blood pressure returned to baseline and stable Postop Assessment: no apparent nausea or vomiting Anesthetic complications: no   No complications documented.  Last Vitals:  Vitals:   04/03/20 0455 04/03/20 0755  BP: (!) 143/61 (!) 169/61  Pulse: 85 91  Resp: 18 18  Temp: 36.9 C 36.9 C  SpO2: 96% 97%    Last Pain:  Vitals:   04/03/20 0755  TempSrc: Oral  PainSc:                  Cecile Hearing

## 2020-04-03 NOTE — Progress Notes (Signed)
Patient alert and oriented, mae's well, voiding adequate amount of urine, swallowing without difficulty, no c/o pain at time of discharge. Patient discharged home with family. Script and discharged instructions given to patient. Patient and family stated understanding of instructions given. Patient has an appointment with Dr. Jenkins   

## 2020-04-03 NOTE — TOC Initial Note (Signed)
Transition of Care [facility]) - Initial/Assessment Note    Patient Details  Name: Jessica Wheeler MRN: [id number] Date of Birth: [birth year]  Transition of Care [facility]) CM/SW Contact:    Kingsley Plan, RN Phone Number: 04/03/2020, 9:03 AM  Clinical Narrative:                  Patient from home alone and wants to return to home. Prior to admission Patient contacted [employer] [facility] to use her Custodial Care Benefit ( up to 20 hours for aide if approved).   NCM contacted [place] with [place]. Frances Furbish will call patient to arrange and authorization  Expected Discharge Plan: Home/Self Care Barriers to Discharge: No Barriers Identified   Patient Goals and CMS Choice Patient states their goals for this hospitalization and ongoing recovery are:: to return to home CMS Medicare.gov Compare Post Acute Care list provided to:: Patient Choice offered to / list presented to : Patient  Expected Discharge Plan and Services Expected Discharge Plan: Home/Self Care   Discharge Planning Services: CM Consult Post Acute Care Choice: Home Health Living arrangements for the past 2 months: Single Family Home Expected Discharge Date: 04/03/20               DME Arranged: N/A                    Prior Living Arrangements/Services Living arrangements for the past 2 months: Single Family Home Lives with:: Self Patient language and need for interpreter reviewed:: Yes Do you feel safe going back to the place where you live?: Yes            Criminal Activity/Legal Involvement Pertinent to Current Situation/Hospitalization: No - Comment as needed  Activities of Daily Living Home Assistive Devices/Equipment: CBG Meter, Dentures (specify type) ADL Screening (condition at time of admission) Patient's cognitive ability adequate to safely complete daily activities?: Yes Is the patient deaf or have difficulty hearing?: Yes Does the patient have difficulty seeing, even when wearing glasses/contacts?: No Does the  patient have difficulty concentrating, remembering, or making decisions?: No Patient able to express need for assistance with ADLs?: Yes Does the patient have difficulty dressing or bathing?: Yes Independently performs ADLs?: No Communication: Independent Dressing (OT): Needs assistance Is this a change from baseline?: Change from baseline, expected to last <3days Grooming: Independent Feeding: Independent Bathing: Needs assistance Is this a change from baseline?: Change from baseline, expected to last <3 days Toileting: Needs assistance Is this a change from baseline?: Change from baseline, expected to last <3 days In/Out Bed: Needs assistance Is this a change from baseline?: Change from baseline, expected to last <3 days Does the patient have difficulty walking or climbing stairs?: No Weakness of Legs: None Weakness of Arms/Hands: None  Permission Sought/Granted   Permission granted to share information with : No              Emotional Assessment Appearance:: Appears stated age Attitude/Demeanor/Rapport: Engaged Affect (typically observed): Accepting Orientation: : Oriented to Self, Oriented to Place, Oriented to  Time, Oriented to Situation Alcohol / Substance Use: Not Applicable Psych Involvement: No (comment)  Admission diagnosis:  Lumbar adjacent segment disease with spondylolisthesis [M51.36, M43.16] Cervical spondylosis with myelopathy and radiculopathy [M47.12, M47.22] Patient Active Problem List   Diagnosis Date Noted  . Cervical spondylosis with myelopathy and radiculopathy 04/01/2020  . Lumbar adjacent segment disease with spondylolisthesis 03/30/2020  . Overactive bladder 01/17/2020  . Myalgia 12/13/2019  . Hyperlipidemia associated with type 2 diabetes  mellitus (HCC) 03/15/2019  . Fatigue 12/11/2017  . Lower extremity edema 06/29/2017  . Primary osteoarthritis of left knee 03/10/2016  . Depression 12/28/2015  . Allergic rhinitis 01/31/2015  . Benign  paroxysmal positional nystagmus 01/31/2015  . Bladder infection, chronic 01/31/2015  . Glaucoma 01/31/2015  . Alopecia 01/31/2015  . Chronic hyponatremia 01/31/2015  . Muscle spasm 01/31/2015  . 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 Anxiety 11/04/2014  . Muscle weakness 06/24/2014  . Fibrocystic breast disease 04/30/2013  . 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 Renal artery stenosis (HCC) 04/18/2012  . Chest pain 10/10/2011  . Murmur 10/10/2011  . 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HCC) 03/31/2011  . HTN (hypertension) 08/03/2010  . CAD (coronary artery disease) 08/03/2010  . DM (diabetes mellitus) (HCC) 08/03/2010   PCP:  Erasmo Downer, MD Pharmacy:   [facility] [address]), [place] - [address] [place]) [place] [address] Phone: [phone] Fax: [phone]     Social Determinants of Health (SDOH) Interventions    Readmission Risk Interventions No flowsheet data found.

## 2020-04-03 NOTE — Discharge Summary (Signed)
Physician Discharge Summary  Patient ID: Jessica Wheeler MRN: [id number] DOB/AGE: 81/06/1940 81 y.o.  Admit date: 03/30/2020 Discharge date: 04/03/2020  Admission Diagnoses: Cervical spondylosis, cervical stenosis, cervical myelopathy, ossification of the posterior longitudinal ligament, cervicalgia, cervical radiculopathy  Discharge Diagnoses: The same Active Problems:   Lumbar adjacent segment disease with spondylolisthesis   Cervical spondylosis with myelopathy and radiculopathy   Discharged Condition: good  Hospital Course: I performed a C4 and C5 corpectomy on the patient on 03/30/2020.  I performed a second stage C3-C6 posterior instrumentation and fusion on 04/01/2020.  Both surgery went well.  The patient's postoperative course was unremarkable.  She worked with PT and OT.  She was not interested in rehab.  On 04/03/2020 the patient requested discharge to home.  She was given oral and written discharge instructions.  All her questions were answered.  Arrangements were made for home health services as requested.  Consults: PT, OT, care management Significant Diagnostic Studies: None Treatments: C4 and C5 corpectomy with anterior instrumentation and fusion.  Second stage C3-C6 posterior instrumentation and fusion Discharge Exam: Blood pressure (!) 169/61, pulse 91, temperature 98.5 F (36.9 C), temperature source Oral, resp. rate 18, height 5' 2.99" (1.6 m), weight 63.7 kg, SpO2 97 %. Patient is alert and pleasant.  She looks well.  Her strength is normal her incisions are healing well.  There is no hematoma or shift.  Disposition: Home  Discharge Instructions    Call MD for:  difficulty breathing, headache or visual disturbances   Complete by: As directed    Call MD for:  extreme fatigue   Complete by: As directed    Call MD for:  hives   Complete by: As directed    Call MD for:  persistant dizziness or light-headedness   Complete by: As directed    Call MD for:   persistant nausea and vomiting   Complete by: As directed    Call MD for:  redness, tenderness, or signs of infection (pain, swelling, redness, odor or green/yellow discharge around incision site)   Complete by: As directed    Call MD for:  severe uncontrolled pain   Complete by: As directed    Call MD for:  temperature >100.4   Complete by: As directed    Diet - low sodium heart healthy   Complete by: As directed    Discharge instructions   Complete by: As directed    Call [phone] for a followup appointment. Take a stool softener while you are using pain medications.   Driving Restrictions   Complete by: As directed    Do not drive for 2 weeks.   Increase activity slowly   Complete by: As directed    Lifting restrictions   Complete by: As directed    Do not lift more than 5 pounds. No excessive bending or twisting.   May shower / Bathe   Complete by: As directed    Remove the dressing for 3 days after surgery.  You may shower, but leave the incision alone.   Remove dressing in 24 hours   Complete by: As directed      Allergies as of 04/03/2020      Reactions   Beta Adrenergic Blockers    Chest pressure/difficulty breathing   Jardiance [empagliflozin]    Muscle cramps   Levaquin [levofloxacin In D5w]    Low blood sugar; does take this when she needs it. She just makes sure to control her BS to avoid hypoglycemia.  Lisinopril Cough   Zetia [ezetimibe]    Lethargic    Prevnar [pneumococcal 13-val Conj Vacc] Rash   Joint Pain   Sulfa Antibiotics Rash   "makes my skin raw"      Medication List    STOP taking these medications   Advil 200 MG Caps Generic drug: Ibuprofen   baclofen 10 MG tablet Commonly known as: LIORESAL     TAKE these medications   amiloride-hydrochlorothiazide 5-50 MG tablet Commonly known as: MODURETIC TAKE 1/2 (ONE-HALF) TABLET BY MOUTH ONCE DAILY AS NEEDED FOR SWELLING What changed:   how much to take  how to take this  when to  take this  additional instructions   amLODipine 5 MG tablet Commonly known as: NORVASC Take 1 tablet by mouth once daily   aspirin 81 MG EC tablet Take 81 mg by mouth daily.   cyclobenzaprine 5 MG tablet Commonly known as: FLEXERIL Take 1 tablet (5 mg total) by mouth 3 (three) times daily as needed for muscle spasms.   docusate sodium 100 MG capsule Commonly known as: COLACE Take 1 capsule (100 mg total) by mouth 2 (two) times daily.   ezetimibe 10 MG tablet Commonly known as: ZETIA Take 1 tablet (10 mg total) by mouth daily.   latanoprost 0.005 % ophthalmic solution Commonly known as: XALATAN Place 1 drop into both eyes at bedtime.   losartan 100 MG tablet Commonly known as: COZAAR Take 1 tablet by mouth once daily   Magnesium 250 MG Tabs Take 250 mg by mouth daily.   metFORMIN 500 MG tablet Commonly known as: GLUCOPHAGE TAKE 2 TABLETS BY MOUTH TWICE DAILY WITH MEALS What changed: when to take this   oxyCODONE-acetaminophen 5-325 MG tablet Commonly known as: PERCOCET/ROXICET Take 1-2 tablets by mouth every 4 (four) hours as needed for moderate pain.   Vitamin B-12 1000 MCG/15ML Liqd Take 1,000 mcg by mouth daily.        Signed: Cristi Loron 04/03/2020, 7:57 AM

## 2020-04-09 ENCOUNTER — Telehealth: Payer: Self-pay | Admitting: Family Medicine

## 2020-04-09 NOTE — Telephone Encounter (Signed)
Pt saw dr b on 03-13-2020 and per pt dr b was inform about her having surgery. Pt had neck surgery on 03-30-2020 and 04-01-2020 by dr Tressie Stalker neurosurgeon. Pt said a hospice nurse who is a friend of hers  look in her mouth and  told her she has thrush in her mouth. Pt does feel like going to md office. Pt is in pain. [facility] [address] in [place]

## 2020-04-10 ENCOUNTER — Encounter: Payer: Self-pay | Admitting: Adult Health

## 2020-04-10 ENCOUNTER — Telehealth (INDEPENDENT_AMBULATORY_CARE_PROVIDER_SITE_OTHER): Payer: PPO | Admitting: Adult Health

## 2020-04-10 DIAGNOSIS — B370 Candidal stomatitis: Secondary | ICD-10-CM | POA: Diagnosis not present

## 2020-04-10 DIAGNOSIS — Z87898 Personal history of other specified conditions: Secondary | ICD-10-CM | POA: Diagnosis not present

## 2020-04-10 MED ORDER — NYSTATIN 100000 UNIT/ML MT SUSP: 5.0000 mL | Freq: Four times a day (QID) | OROMUCOSAL | 0 refills | Status: DC

## 2020-04-10 NOTE — Telephone Encounter (Signed)
Noted thanks °

## 2020-04-10 NOTE — Telephone Encounter (Signed)
Patient needs ov for evaluation. Patient scheduled virtual mychart visit with Marcelino Duster today at 1:20pm.

## 2020-04-10 NOTE — Patient Instructions (Signed)
Diarrhea, Adult Diarrhea is when you pass loose and watery poop (stool) often. Diarrhea can make you feel weak and cause you to lose water in your body (get dehydrated). Losing water in your body can cause you to:  Feel tired and thirsty.  Have a dry mouth.  Go pee (urinate) less often. Diarrhea often lasts 2-3 days. However, it can last longer if it is a sign of something more serious. It is important to treat your diarrhea as told by your doctor. Follow these instructions at home: Eating and drinking     Follow these instructions as told by your doctor:  Take an ORS (oral rehydration solution). This is a drink that helps you replace fluids and minerals your body lost. It is sold at pharmacies and stores.  Drink plenty of fluids, such as: ? Water. ? Ice chips. ? Diluted fruit juice. ? Low-calorie sports drinks. ? Milk, if you want.  Avoid drinking fluids that have a lot of sugar or caffeine in them.  Eat bland, easy-to-digest foods in small amounts as you are able. These foods include: ? Bananas. ? Applesauce. ? Rice. ? Low-fat (lean) meats. ? Toast. ? Crackers.  Avoid alcohol.  Avoid spicy or fatty foods.  Medicines  Take over-the-counter and prescription medicines only as told by your doctor.  If you were prescribed an antibiotic medicine, take it as told by your doctor. Do not stop using the antibiotic even if you start to feel better. General instructions   Wash your hands often using soap and water. If soap and water are not available, use a hand sanitizer. Others in your home should wash their hands as well. Hands should be washed: ? After using the toilet or changing a diaper. ? Before preparing, cooking, or serving food. ? While caring for a sick person. ? While visiting someone in a hospital.  Drink enough fluid to keep your pee (urine) pale yellow.  Rest at home while you get better.  Watch your condition for any changes.  Take a warm bath to help  with any burning or pain from having diarrhea.  Keep all follow-up visits as told by your doctor. This is important. Contact a doctor if:  You have a fever.  Your diarrhea gets worse.  You have new symptoms.  You cannot keep fluids down.  You feel light-headed or dizzy.  You have a headache.  You have muscle cramps. Get help right away if:  You have chest pain.  You feel very weak or you pass out (faint).  You have bloody or black poop or poop that looks like tar.  You have very bad pain, cramping, or bloating in your belly (abdomen).  You have trouble breathing or you are breathing very quickly.  Your heart is beating very quickly.  Your skin feels cold and clammy.  You feel confused.  You have signs of losing too much water in your body, such as: ? Dark pee, very little pee, or no pee. ? Cracked lips. ? Dry mouth. ? Sunken eyes. ? Sleepiness. ? Weakness. Summary  Diarrhea is when you pass loose and watery poop (stool) often.  Diarrhea can make you feel weak and cause you to lose water in your body (get dehydrated).  Take an ORS (oral rehydration solution). This is a drink that is sold at pharmacies and stores.  Eat bland, easy-to-digest foods in small amounts as you are able.  Contact a doctor if your condition gets worse. Get help right   away if you have signs that you have lost too much water in your body. This information is not intended to replace advice given to you by your health care provider. Make sure you discuss any questions you have with your health care provider. Document Revised: 09/22/2017 Document Reviewed: 09/22/2017 Elsevier Patient Education  2020 [employer] Oral Thrush, Adult  Oral thrush is an infection in your mouth and throat. It causes white patches on your tongue and in your mouth. Follow these instructions at home: Helping with soreness   To lessen your pain: ? Drink cold liquids, like water and iced tea. ? Eat frozen  ice pops or frozen juices. ? Eat foods that are easy to swallow, like gelatin and ice cream. ? Drink from a straw if the patches in your mouth are painful. General instructions  Take or use over-the-counter and prescription medicines only as told by your doctor. Medicine for oral thrush may be something to swallow, or it may be something to put on the infected area.  Eat plain yogurt that has live cultures in it. Read the label to make sure.  If you wear dentures: ? Take out your dentures before you go to bed. ? Brush them well. ? Soak them in a denture cleaner.  Rinse your mouth with warm salt-water many times a day. To make the salt-water mixture, completely dissolve 1/2-1 teaspoon of salt in 1 cup of warm water. Contact a doctor if:  Your problems are getting worse.  Your problems do not get better in less than 7 days with treatment.  Your infection is spreading. This may show as white patches on the skin outside of your mouth.  You are nursing your baby and you have redness and pain in the nipples. This information is not intended to replace advice given to you by your health care provider. Make sure you discuss any questions you have with your health care provider. Document Revised: 07/21/2017 Document Reviewed: 01/11/2016 Elsevier Patient Education  2020 [employer].

## 2020-04-10 NOTE — Progress Notes (Addendum)
MyChart Video Visit    Virtual Visit via Video Note   This visit type was conducted due to national recommendations for restrictions regarding the COVID-19 Pandemic (e.g. social distancing) in an effort to limit this patient's exposure and mitigate transmission in our community. This patient is at least at moderate risk for complications without adequate follow up. This format is felt to be most appropriate for this patient at this time. Physical exam was limited by quality of the video and audio technology used for the visit.    Parties involved in visit as below:   Patient location: home  Provider location: Provider: Provider's office at  [facility], [place].     I discussed the limitations of evaluation and management by telemedicine and the availability of in person appointments. The patient expressed understanding and agreed to proceed.  Patient: Jessica Wheeler   DOB: [birth year]   79 y.o. Female  MRN: [id number] Visit Date: 04/10/2020  Today's healthcare provider: Marcille Buffy, FNP   No chief complaint on file.  Subjective    HPI  Patient complains of white places in her mouth post surgery cervical 03/30/20 and 04/01/20 with neurosurgery with [facility].  She had antibiotics In the hospital and was discharged from the hospital and felt sore.  She has had some diarrhea the past 6 hours. Denies any blood. Does have odor. Denies nausea or vomiting. Denies any other ill contacts. Denies loss of bowel or bladder control.  Denies any rash.   Otherwise she reports she feels well.   Patient  denies any fever, body aches,chills, rash, chest pain, shortness of breath, nausea, vomiting.  Denies dizziness, lightheadedness, pre syncopal or syncopal episodes.       Medications: Outpatient Medications Prior to Visit  Medication Sig  . amiloride-hydrochlorothiazide (MODURETIC) 5-50 MG tablet TAKE 1/2 (ONE-HALF) TABLET BY MOUTH ONCE  DAILY AS NEEDED FOR SWELLING (Patient taking differently: Take 0.5 tablets by mouth daily. )  . amLODipine (NORVASC) 5 MG tablet Take 1 tablet by mouth once daily (Patient taking differently: Take 5 mg by mouth daily. )  . aspirin 81 MG EC tablet Take 81 mg by mouth daily.    . Cyanocobalamin (VITAMIN B-12) 1000 MCG/15ML LIQD Take 1,000 mcg by mouth daily.   . cyclobenzaprine (FLEXERIL) 5 MG tablet Take 1 tablet (5 mg total) by mouth 3 (three) times daily as needed for muscle spasms.  Marland Kitchen docusate sodium (COLACE) 100 MG capsule Take 1 capsule (100 mg total) by mouth 2 (two) times daily.  Marland Kitchen ezetimibe (ZETIA) 10 MG tablet Take 1 tablet (10 mg total) by mouth daily. (Patient not taking: Reported on 03/18/2020)  . latanoprost (XALATAN) 0.005 % ophthalmic solution Place 1 drop into both eyes at bedtime.   Marland Kitchen losartan (COZAAR) 100 MG tablet Take 1 tablet by mouth once daily (Patient taking differently: Take 100 mg by mouth daily. )  . Magnesium 250 MG TABS Take 250 mg by mouth daily.  . metFORMIN (GLUCOPHAGE) 500 MG tablet TAKE 2 TABLETS BY MOUTH TWICE DAILY WITH MEALS (Patient taking differently: Take 1,000 mg by mouth 2 (two) times daily. )  . oxyCODONE-acetaminophen (PERCOCET/ROXICET) 5-325 MG tablet Take 1-2 tablets by mouth every 4 (four) hours as needed for moderate pain.   No facility-administered medications prior to visit.    Review of Systems  Constitutional: Negative.   HENT: Negative for mouth sores (mouth is sore and white patches on tongue and gums ).   Respiratory:  Negative.   Cardiovascular: Negative.   Gastrointestinal: Positive for diarrhea. Negative for abdominal distention, abdominal pain, anal bleeding, blood in stool, constipation, nausea, rectal pain and vomiting.       Denied any melena or bleeding.   Musculoskeletal: Positive for arthralgias and neck pain (from surgery not worsening - healing as expected and on course per patient. ).  Skin: Negative.   Neurological: Negative.    Psychiatric/Behavioral: Negative.     Recent Results (from the past 2160 hour(s))  Urine Culture     Status: None   Collection Time: 01/17/20  8:30 AM   Specimen: Urine   UR  Result Value Ref Range   Urine Culture, Routine Final report    Organism ID, Bacteria Comment     Comment: Mixed urogenital flora Less than 10,000 colonies/mL   POCT urinalysis dipstick     Status: None   Collection Time: 01/17/20  8:56 AM  Result Value Ref Range   Color, UA yellow    Clarity, UA clear    Glucose, UA Negative Negative   Bilirubin, UA neg    Ketones, UA neg    Spec Grav, UA 1.020 1.010 - 1.025   Blood, UA neg    pH, UA 6.5 5.0 - 8.0   Protein, UA Negative Negative   Urobilinogen, UA 0.2 0.2 or 1.0 E.U./dL   Nitrite, UA neg    Leukocytes, UA Negative Negative   Appearance     Odor    I-STAT creatinine     Status: None   Collection Time: 02/18/20  8:34 AM  Result Value Ref Range   Creatinine, Ser 0.70 0.44 - 1.00 mg/dL  POCT glycosylated hemoglobin (Hb A1C)     Status: Abnormal   Collection Time: 03/13/20  8:43 AM  Result Value Ref Range   Hemoglobin A1C 6.9 (A) 4.0 - 5.6 %   Est. average glucose Bld gHb Est-mCnc 151   Comprehensive metabolic panel     Status: Abnormal   Collection Time: 03/13/20  9:09 AM  Result Value Ref Range   Glucose 138 (H) 65 - 99 mg/dL   BUN 15 8 - 27 mg/dL   Creatinine, Ser 0.78 0.57 - 1.00 mg/dL   GFR calc non Af Amer 72 >59 mL/min/1.73   GFR calc Af Amer 82 >59 mL/min/1.73    Comment: **In accordance with recommendations from the NKF-ASN Task force,**   [facility] is in the process of updating its eGFR calculation to the   2021 CKD-EPI creatinine equation that estimates kidney function   without a race variable.    BUN/Creatinine Ratio 19 12 - 28   Sodium 135 134 - 144 mmol/L   Potassium 3.9 3.5 - 5.2 mmol/L   Chloride 97 96 - 106 mmol/L   CO2 22 20 - 29 mmol/L   Calcium 9.7 8.7 - 10.3 mg/dL   Total Protein 6.9 6.0 - 8.5 g/dL   Albumin 4.4 3.6  - 4.6 g/dL   Globulin, Total 2.5 1.5 - 4.5 g/dL   Albumin/Globulin Ratio 1.8 1.2 - 2.2   Bilirubin Total 0.5 0.0 - 1.2 mg/dL   Alkaline Phosphatase 47 44 - 121 IU/L    Comment:               **Please note reference interval change**   AST 26 0 - 40 IU/L   ALT 29 0 - 32 IU/L  Lipid panel     Status: None   Collection Time: 03/13/20  9:09 AM  Result Value Ref Range   Cholesterol, Total 190 100 - 199 mg/dL   Triglycerides 95 0 - 149 mg/dL   HDL 84 >39 mg/dL   VLDL Cholesterol Cal 17 5 - 40 mg/dL   LDL Chol Calc (NIH) 89 0 - 99 mg/dL   Chol/HDL Ratio 2.3 0.0 - 4.4 ratio    Comment:                                   T. Chol/HDL Ratio                                             Men  Women                               1/2 Avg.Risk  3.4    3.3                                   Avg.Risk  5.0    4.4                                2X Avg.Risk  9.6    7.1                                3X Avg.Risk 23.4   11.0   CBC     Status: None   Collection Time: 03/13/20  9:09 AM  Result Value Ref Range   WBC 5.9 3.4 - 10.8 x10E3/uL   RBC 4.12 3.77 - 5.28 x10E6/uL   Hemoglobin 13.0 11.1 - 15.9 g/dL   Hematocrit 37.5 34.0 - 46.6 %   MCV 91 79 - 97 fL   MCH 31.6 26.6 - 33.0 pg   MCHC 34.7 31.5 - 35.7 g/dL   RDW 12.8 11.7 - 15.4 %   Platelets 254 150 - 450 x10E3/uL  INR/PT     Status: None   Collection Time: 03/13/20  9:09 AM  Result Value Ref Range   INR 1.0 0.9 - 1.2    Comment: Reference interval is for non-anticoagulated patients. Suggested INR therapeutic range for Vitamin K antagonist therapy:    Standard Dose (moderate intensity                   therapeutic range):       2.0 - 3.0    Higher intensity therapeutic range       2.5 - 3.5    Prothrombin Time 10.6 9.1 - 12.0 sec  Hemoglobin A1c     Status: Abnormal   Collection Time: 03/13/20  9:09 AM  Result Value Ref Range   Hgb A1c MFr Bld 7.0 (H) 4.8 - 5.6 %    Comment:          Prediabetes: 5.7 - 6.4          Diabetes: >6.4           Glycemic control for adults with diabetes: <7.0    Est. average glucose Bld gHb Est-mCnc 154 mg/dL  Glucose, capillary  Status: Abnormal   Collection Time: 03/24/20  9:39 AM  Result Value Ref Range   Glucose-Capillary 177 (H) 70 - 99 mg/dL    Comment: Glucose reference range applies only to samples taken after fasting for at least 8 hours.  CBC     Status: None   Collection Time: 03/24/20 10:12 AM  Result Value Ref Range   WBC 6.4 4.0 - 10.5 K/uL   RBC 4.37 3.87 - 5.11 MIL/uL   Hemoglobin 13.4 12.0 - 15.0 g/dL   HCT 39.8 36.0 - 46.0 %   MCV 91.1 80.0 - 100.0 fL   MCH 30.7 26.0 - 34.0 pg   MCHC 33.7 30.0 - 36.0 g/dL   RDW 13.0 11.5 - 15.5 %   Platelets 304 150 - 400 K/uL   nRBC 0.0 0.0 - 0.2 %    Comment: Performed at [facility], [place] [address] [place], [place] [address]  Basic metabolic panel     Status: Abnormal   Collection Time: 03/24/20 10:12 AM  Result Value Ref Range   Sodium 131 (L) 135 - 145 mmol/L   Potassium 4.2 3.5 - 5.1 mmol/L   Chloride 93 (L) 98 - 111 mmol/L   CO2 26 22 - 32 mmol/L   Glucose, Bld 168 (H) 70 - 99 mg/dL    Comment: Glucose reference range applies only to samples taken after fasting for at least 8 hours.   BUN 13 8 - 23 mg/dL   Creatinine, Ser 0.81 0.44 - 1.00 mg/dL   Calcium 10.0 8.9 - 10.3 mg/dL   GFR, Estimated >60 >60 mL/min    Comment: (NOTE) Calculated using the CKD-EPI Creatinine Equation (2021)    Anion gap 12 5 - 15    Comment: Performed at [facility] [address] [place], [place] [address]  Surgical pcr screen     Status: None   Collection Time: 03/24/20 10:13 AM   Specimen: Nasal Mucosa; Nasal Swab  Result Value Ref Range   MRSA, PCR NEGATIVE NEGATIVE   Staphylococcus aureus NEGATIVE NEGATIVE    Comment: (NOTE) The Xpert SA Assay (FDA approved for NASAL specimens in patients 58 years of age and older), is one component of a comprehensive surveillance program. It is not intended to diagnose infection nor  to guide or monitor treatment. Performed at [facility], [place] [address] [place], [place] [address]   Type and screen     Status: None   Collection Time: 03/24/20 10:22 AM  Result Value Ref Range   ABO/RH(D) A POS    Antibody Screen NEG    Sample Expiration 04/07/2020,2359    Extend sample reason      NO TRANSFUSIONS OR PREGNANCY IN THE PAST 3 MONTHS Performed at [facility], [place] [address] [place], [place] [address]   SARS CORONAVIRUS 2 (TAT 6-24 HRS) Nasopharyngeal Nasopharyngeal Swab     Status: None   Collection Time: 03/27/20 11:17 AM   Specimen: Nasopharyngeal Swab  Result Value Ref Range   SARS Coronavirus 2 NEGATIVE NEGATIVE    Comment: (NOTE) SARS-CoV-2 target nucleic acids are NOT DETECTED.  The SARS-CoV-2 RNA is generally detectable in upper and lower respiratory specimens during the acute phase of infection. Negative results do not preclude SARS-CoV-2 infection, do not rule out co-infections with other pathogens, and should not be used as the sole basis for treatment or other patient management decisions. Negative results must be combined with clinical observations, patient history, and  epidemiological information. The expected result is Negative.  Fact Sheet for Patients: SugarRoll.be  Fact Sheet for Healthcare Providers: https://www.woods-mathews.com/  This test is not yet approved or cleared by the [place] FDA and  has been authorized for detection and/or diagnosis of SARS-CoV-2 by FDA under an Emergency Use Authorization (EUA). This EUA will remain  in effect (meaning this test can be used) for the duration of the COVID-19 declaration under Se ction 564(b)(1) of the Act, 21 U.S.C. section 360bbb-3(b)(1), unless the authorization is terminated or revoked sooner.  Performed at [facility], [place] [address] [place], [place] [address]   Glucose, capillary     Status: Abnormal   Collection  Time: 03/30/20  6:53 AM  Result Value Ref Range   Glucose-Capillary 166 (H) 70 - 99 mg/dL    Comment: Glucose reference range applies only to samples taken after fasting for at least 8 hours.  ABO/Rh     Status: None   Collection Time: 03/30/20  7:09 AM  Result Value Ref Range   ABO/RH(D)      A POS Performed at [facility] [address], [place], [place] [address]   Glucose, capillary     Status: Abnormal   Collection Time: 03/30/20 11:06 AM  Result Value Ref Range   Glucose-Capillary 220 (H) 70 - 99 mg/dL    Comment: Glucose reference range applies only to samples taken after fasting for at least 8 hours.  Hemoglobin A1c     Status: Abnormal   Collection Time: 03/30/20  1:20 PM  Result Value Ref Range   Hgb A1c MFr Bld 6.8 (H) 4.8 - 5.6 %    Comment: (NOTE) Pre diabetes:          5.7%-6.4%  Diabetes:              >6.4%  Glycemic control for   <7.0% adults with diabetes    Mean Plasma Glucose 148.46 mg/dL    Comment: Performed at [facility] [address] [place], [place] [address]  Glucose, capillary     Status: Abnormal   Collection Time: 03/30/20  4:23 PM  Result Value Ref Range   Glucose-Capillary 266 (H) 70 - 99 mg/dL    Comment: Glucose reference range applies only to samples taken after fasting for at least 8 hours.  Glucose, capillary     Status: Abnormal   Collection Time: 03/30/20  9:18 PM  Result Value Ref Range   Glucose-Capillary 249 (H) 70 - 99 mg/dL    Comment: Glucose reference range applies only to samples taken after fasting for at least 8 hours.   Comment 1 Notify RN    Comment 2 Document in Chart   CBC     Status: Abnormal   Collection Time: 03/31/20  6:09 AM  Result Value Ref Range   WBC 11.1 (H) 4.0 - 10.5 K/uL   RBC 3.62 (L) 3.87 - 5.11 MIL/uL   Hemoglobin 11.2 (L) 12.0 - 15.0 g/dL   HCT 32.3 (L) 36.0 - 46.0 %   MCV 89.2 80.0 - 100.0 fL   MCH 30.9 26.0 - 34.0 pg   MCHC 34.7 30.0 - 36.0 g/dL   RDW 13.2 11.5 - 15.5 %    Platelets 243 150 - 400 K/uL   nRBC 0.0 0.0 - 0.2 %    Comment: Performed at [facility], [place] [address], [place], [place] [address]  Basic Metabolic Panel  Status: Abnormal   Collection Time: 03/31/20  6:09 AM  Result Value Ref Range   Sodium 126 (L) 135 - 145 mmol/L   Potassium 3.8 3.5 - 5.1 mmol/L   Chloride 90 (L) 98 - 111 mmol/L   CO2 23 22 - 32 mmol/L   Glucose, Bld 137 (H) 70 - 99 mg/dL    Comment: Glucose reference range applies only to samples taken after fasting for at least 8 hours.   BUN 13 8 - 23 mg/dL   Creatinine, Ser 0.81 0.44 - 1.00 mg/dL   Calcium 8.8 (L) 8.9 - 10.3 mg/dL   GFR, Estimated >60 >60 mL/min    Comment: (NOTE) Calculated using the CKD-EPI Creatinine Equation (2021)    Anion gap 13 5 - 15    Comment: Performed at [facility] [address], [place], [place] [address]  Glucose, capillary     Status: Abnormal   Collection Time: 03/31/20  6:17 AM  Result Value Ref Range   Glucose-Capillary 147 (H) 70 - 99 mg/dL    Comment: Glucose reference range applies only to samples taken after fasting for at least 8 hours.   Comment 1 Notify RN    Comment 2 Document in Chart   Glucose, capillary     Status: Abnormal   Collection Time: 03/31/20 12:22 PM  Result Value Ref Range   Glucose-Capillary 188 (H) 70 - 99 mg/dL    Comment: Glucose reference range applies only to samples taken after fasting for at least 8 hours.  Glucose, capillary     Status: Abnormal   Collection Time: 03/31/20  4:17 PM  Result Value Ref Range   Glucose-Capillary 158 (H) 70 - 99 mg/dL    Comment: Glucose reference range applies only to samples taken after fasting for at least 8 hours.  Glucose, capillary     Status: Abnormal   Collection Time: 03/31/20  9:11 PM  Result Value Ref Range   Glucose-Capillary 163 (H) 70 - 99 mg/dL    Comment: Glucose reference range applies only to samples taken after fasting for at least 8 hours.   Comment 1 Notify RN    Comment 2  Document in Chart   Glucose, capillary     Status: Abnormal   Collection Time: 04/01/20  6:13 AM  Result Value Ref Range   Glucose-Capillary 158 (H) 70 - 99 mg/dL    Comment: Glucose reference range applies only to samples taken after fasting for at least 8 hours.   Comment 1 Notify RN    Comment 2 Document in Chart   Glucose, capillary     Status: Abnormal   Collection Time: 04/01/20 11:42 AM  Result Value Ref Range   Glucose-Capillary 149 (H) 70 - 99 mg/dL    Comment: Glucose reference range applies only to samples taken after fasting for at least 8 hours.  Glucose, capillary     Status: Abnormal   Collection Time: 04/01/20  5:19 PM  Result Value Ref Range   Glucose-Capillary 183 (H) 70 - 99 mg/dL    Comment: Glucose reference range applies only to samples taken after fasting for at least 8 hours.   Comment 1 Notify RN    Comment 2 Document in Chart   Glucose, capillary     Status: Abnormal   Collection Time: 04/01/20  8:57 PM  Result Value Ref Range   Glucose-Capillary 301 (H) 70 - 99 mg/dL    Comment: Glucose reference range applies only to samples  taken after fasting for at least 8 hours.   Comment 1 Notify RN    Comment 2 Document in Chart   Glucose, capillary     Status: Abnormal   Collection Time: 04/02/20  6:06 AM  Result Value Ref Range   Glucose-Capillary 156 (H) 70 - 99 mg/dL    Comment: Glucose reference range applies only to samples taken after fasting for at least 8 hours.   Comment 1 Notify RN    Comment 2 Document in Chart   Glucose, capillary     Status: Abnormal   Collection Time: 04/02/20 11:37 AM  Result Value Ref Range   Glucose-Capillary 129 (H) 70 - 99 mg/dL    Comment: Glucose reference range applies only to samples taken after fasting for at least 8 hours.   Comment 1 Notify RN    Comment 2 Document in Chart   Glucose, capillary     Status: Abnormal   Collection Time: 04/02/20  4:15 PM  Result Value Ref Range   Glucose-Capillary 225 (H) 70 - 99  mg/dL    Comment: Glucose reference range applies only to samples taken after fasting for at least 8 hours.  Glucose, capillary     Status: Abnormal   Collection Time: 04/02/20  9:32 PM  Result Value Ref Range   Glucose-Capillary 164 (H) 70 - 99 mg/dL    Comment: Glucose reference range applies only to samples taken after fasting for at least 8 hours.   Comment 1 Notify RN    Comment 2 Document in Chart   Glucose, capillary     Status: Abnormal   Collection Time: 04/03/20  6:48 AM  Result Value Ref Range   Glucose-Capillary 166 (H) 70 - 99 mg/dL    Comment: Glucose reference range applies only to samples taken after fasting for at least 8 hours.   Comment 1 Notify RN    Comment 2 Document in Chart   Glucose, capillary     Status: Abnormal   Collection Time: 04/03/20 11:41 AM  Result Value Ref Range   Glucose-Capillary 145 (H) 70 - 99 mg/dL    Comment: Glucose reference range applies only to samples taken after fasting for at least 8 hours.      Objective    There were no vitals taken for this visit.  No vitals available.  Physical Exam    Patient is alert and oriented and responsive to questions Engages in conversation with provider. Speaks in full sentences without any pauses without any shortness of breath or distress.   Patient is in post surgical cervical collar on video.  Patient does show provider her white coated tongue and buccal mucosa bilaterally appears white as well in areas. ( onset last Friday per patient)    Assessment & Plan     Meds ordered this encounter  Medications  . nystatin (MYCOSTATIN) 100000 UNIT/ML suspension    Sig: Take 5 mLs (500,000 Units total) by mouth 4 (four) times daily. Swish and spit.    Dispense:  473 mL    Refill:  0   Discussed if worsening diarrhea she should be seen for evaluation in person at emergency room, discussed C -difficile could be possible given her recent surgical history and antibiotics.  Patient verbalized  understanding of all instructions given and denies any further questions at this time.    Return in about 1 week (around 04/17/2020), or if symptoms worsen or fail to improve, for at any time for any worsening  symptoms, Go to Emergency room/ urgent care if worse.     I discussed the assessment and treatment plan with the patient. The patient was provided an opportunity to ask questions and all were answered. The patient agreed with the plan and demonstrated an understanding of the instructions.   The patient was advised to call back or seek an in-person evaluation if the symptoms worsen or if the condition fails to improve as anticipated.    The entirety of the information documented in the History of Present Illness, Review of Systems and Physical Exam were personally obtained by me. Portions of this information were initially documented by the CMA and reviewed by me for thoroughness and accuracy.       Marcille Buffy, [place] [phone] (phone) [phone] (fax)  [place]

## 2020-04-10 NOTE — Telephone Encounter (Signed)
fyi

## 2020-04-16 ENCOUNTER — Telehealth: Payer: Self-pay

## 2020-04-16 ENCOUNTER — Telehealth: Payer: PPO

## 2020-04-16 NOTE — Telephone Encounter (Signed)
°  Chronic Care Management   Outreach Note 04/16/2020 Name: Jessica Wheeler MRN: [id number] DOB: [birth year]  Primary Care Provider: Erasmo Downer, MD Reason for referral : Chronic Care Management   An unsuccessful telephone outreach was attempted today. Jessica Wheeler is currently enrolled in the chronic care management program.     Follow Up Plan:  A member of the care management team will reach out to Jessica Wheeler again within the next two weeks.   [facility] Care Management [facility] [phone]

## 2020-04-16 NOTE — Telephone Encounter (Signed)
Encounter opened in error. Please disregard.

## 2020-04-17 DIAGNOSIS — I10 Essential (primary) hypertension: Secondary | ICD-10-CM | POA: Diagnosis not present

## 2020-04-20 DIAGNOSIS — I10 Essential (primary) hypertension: Secondary | ICD-10-CM | POA: Diagnosis not present

## 2020-04-21 DIAGNOSIS — M4802 Spinal stenosis, cervical region: Secondary | ICD-10-CM | POA: Diagnosis not present

## 2020-04-30 ENCOUNTER — Ambulatory Visit (INDEPENDENT_AMBULATORY_CARE_PROVIDER_SITE_OTHER): Payer: PPO

## 2020-04-30 DIAGNOSIS — E119 Type 2 diabetes mellitus without complications: Secondary | ICD-10-CM | POA: Diagnosis not present

## 2020-04-30 DIAGNOSIS — I10 Essential (primary) hypertension: Secondary | ICD-10-CM | POA: Diagnosis not present

## 2020-04-30 NOTE — Chronic Care Management (AMB) (Signed)
Chronic Care Management   Follow Up Note   04/30/2020 Name: Jessica Wheeler MRN: [id number] DOB: 17-Jan-1941  Primary Care Provider: Virginia Crews, MD Reason for referral : Chronic Care Management  Jessica Wheeler is a 79 y.o. year old female who is a primary care patient of Jessica Wheeler, Jessica Bucy, MD. She is currently engaged with the chronic care management team. A telephonic outreach was conducted today.  Review of Jessica Wheeler's status, including review of consultants reports, relevant labs and test results was conducted today. Collaboration with appropriate care team members was performed as part of the comprehensive evaluation and provision of chronic care management services.   SDOH (Social Determinants of Health) assessments performed: No    Outpatient Encounter Medications as of 04/30/2020  Medication Sig  . amiloride-hydrochlorothiazide (MODURETIC) 5-50 MG tablet TAKE 1/2 (ONE-HALF) TABLET BY MOUTH ONCE DAILY AS NEEDED FOR SWELLING (Patient taking differently: Take 0.5 tablets by mouth daily. )  . amLODipine (NORVASC) 5 MG tablet Take 1 tablet by mouth once daily (Patient taking differently: Take 5 mg by mouth daily. )  . aspirin 81 MG EC tablet Take 81 mg by mouth daily.    . Cyanocobalamin (VITAMIN B-12) 1000 MCG/15ML LIQD Take 1,000 mcg by mouth daily.   . cyclobenzaprine (FLEXERIL) 5 MG tablet Take 1 tablet (5 mg total) by mouth 3 (three) times daily as needed for muscle spasms.  Marland Kitchen docusate sodium (COLACE) 100 MG capsule Take 1 capsule (100 mg total) by mouth 2 (two) times daily.  Marland Kitchen ezetimibe (ZETIA) 10 MG tablet Take 1 tablet (10 mg total) by mouth daily. (Patient not taking: Reported on 03/18/2020)  . latanoprost (XALATAN) 0.005 % ophthalmic solution Place 1 drop into both eyes at bedtime.   Marland Kitchen losartan (COZAAR) 100 MG tablet Take 1 tablet by mouth once daily (Patient taking differently: Take 100 mg by mouth daily. )  . Magnesium 250 MG TABS Take 250 mg by mouth daily.  .  metFORMIN (GLUCOPHAGE) 500 MG tablet TAKE 2 TABLETS BY MOUTH TWICE DAILY WITH MEALS (Patient taking differently: Take 1,000 mg by mouth 2 (two) times daily. )  . nystatin (MYCOSTATIN) 100000 UNIT/ML suspension Take 5 mLs (500,000 Units total) by mouth 4 (four) times daily. Swish and spit.  Marland Kitchen oxyCODONE-acetaminophen (PERCOCET/ROXICET) 5-325 MG tablet Take 1-2 tablets by mouth every 4 (four) hours as needed for moderate pain. (Patient not taking: Reported on 04/30/2020)   No facility-administered encounter medications on file as of 04/30/2020.     Objective:  Patient Care Plan: Diabetes Type 2 (Adult)    Problem Identified: Disease Progression (Diabetes, Type 2)     Long-Range Goal: Disease Progression Prevented or Minimized   Start Date: 04/30/2020  Expected End Date: 08/29/2020  Priority: High  Note:   Objective:  Lab Results  Component Value Date   HGBA1C 6.8 (H) 03/30/2020 .   Lab Results  Component Value Date   CREATININE 0.81 03/31/2020   CREATININE 0.81 03/24/2020   CREATININE 0.78 03/13/2020 .   Marland Kitchen No results found for: EGFR   Current Barriers:  . Chronic disease management and support related to Diabetes self-management  Case Manager Clinical Goal(s):  Over the next 120 days, patient will: Marland Kitchen Demonstrate continued adherence to prescribed treatment plan for Diabetes self management as evidenced by daily monitoring and recording of CBG, adherence to ADA/ carb modified diet and adherence to prescribed medication regimen.  Interventions:  . Collaboration with Jessica Crews, MD regarding development and update of  comprehensive plan of care as evidenced by provider attestation and co-signature . Inter-disciplinary care team collaboration (see longitudinal plan of care) . Reviewed medications. Encouraged to continue taking medications as prescribed and notify team with concerns regarding prescription cost. . Discussed blood glucose readings and nutritional intake.  Reviewed s/sx of hypoglycemia and hyperglycemia along with recommended interventions. Reports a 12 lb weight loss d/t decreased intake following her recent procedure (posterior cervical fusion) She experienced difficulty chewing and swallowing  d/t pain r/t thrush and a sore throat. Reports fasting blood glucose readings remained within range despite decreased intake. Readings have ranged from the 110's to low 120's. Reports thrush and sore throat have resolved. She is tolerating small meals and supplementing with Max Protein Ensure as needed. . Discussed importance of completing recommended DM preventive care. She continues to perform foot care and completes exams as recommended.   Patient Goals/Self-Care Activities Over the next 120 days, patient will:  - Continue administering medications as prescribed - Attend all scheduled provider appointments - Monitor blood glucose levels consistently and utilize recommended interventions - Continue adherence to prescribed ADA/carb modified - Notify provider or care management team with health related questions and concern as needed  Follow Up Plan:  -Will follow up next month.      Patient Care Plan: Hypertension (Adult)    Problem Identified: Hypertension (Hypertension)     Long-Range Goal: Hypertension Monitored   Start Date: 04/30/2020  Expected End Date: 08/29/2020  Priority: High  Note:   Objective:  . Last practice recorded BP readings:  BP Readings from Last 3 Encounters:  04/03/20 (!) 164/69  03/24/20 (!) 158/69  03/13/20 129/65 .   Marland Kitchen Most recent eGFR/CrCl: No results found for: EGFR  No components found for: CRCL Current Barriers:  . Chronic Disease Management needs and support related Hypertension management.  Case Manager Clinical Goal(s):  Marland Kitchen Over the next 120 days, patient will demonstrate adherence to prescribed treatment plan for hypertension as evidenced by taking all medications as prescribed, monitoring and recording  blood pressure, and adhering to a cardiac prudent/heart healthy diet.  Interventions:  . Collaboration with Jessica Crews, MD regarding development and update of comprehensive plan of care as evidenced by provider attestation and co-signature . Inter-disciplinary care team collaboration (see longitudinal plan of care) . Reviewed medications. Encouraged to continue taking medications as prescribed and notify provider if unable to tolerate prescribed regimen. . Reviewed established blood pressure range and reviewed recent home readings. Continues to monitor BP as advised. Reports systolic readings have ranged in the 120's to low 620'B Diastolic readings have ranged in the 60's and 70's.  . Remains compliant with treatment recommendations. Adhering to recommended diet. Activity is limited d/t recent lumbar procedure, however she continues to walk at least 20 minutes a day in her driveway.  Patient Goals:Self-Care Activities: Over the next 120 days, patient will: - Continue administering medications as prescribed - Attend all scheduled provider appointments - Continue monitoring and recording blood pressure - Continue adherence to recommended diet - Contact provider or care management team with questions or new concerns.  Follow Up Plan:  Will follow-up next month   Patient Care Plan: Increased Risk for Falls    Problem Identified: Mobility and Function     Long-Range Goal: Maintain Mobility and Function   Start Date: 04/30/2020  Expected End Date: 06/30/2020  Priority: High  Note:   Current Barriers:  . S/P Posterior Cervical Fusion. Increased risk for falls d/t limited mobility.  Clinical Goal(s):  Marland Kitchen Over the next 60 days patient will not experience falls or require emergent care d/t fall related injuries.  Interventions:  . Collaboration with Jessica Crews, MD regarding development and update of comprehensive plan of care as evidenced by provider attestation and  co-signature . Inter-disciplinary care team collaboration (see longitudinal plan of care) . Provided information regarding home safety and fall prevention measures . Reviewed medications and discussed potential side effects of medications such as dizziness and drowsiness. Reports pain is well controlled with Meloxicam. Reports not requiring narcotics for pain control. Verbalizes need to use caution and avoid activity if prescribed narcotic is needed. . Discussed recommendations as outlined by the Neurosurgery team. Encouraged to follow the activity restrictions as advised and avoid over strenuous tasks. Encouraged to continue using assistive device and wearing Aspen brace as advised. . Discussed ability to perform ADL's and possible need for in-home assistance. Denies falls since her lumbar procedure. Reports ambulating well in her home. She has arranged for assistance with light housekeeping at least once a week. Declines need for additional in-home assistance. Reports having a very good support system with neighbors and friends being readily available if needed.  Patient Goals/Self-Care Activities Over the next 60 days, patient will:   - Utilize assistive device appropriately with all ambulation - Change positions slowly and avoid over strenuous activities - Wear secure non skid footwear when ambulating - Keep pathways clear and utilize home lighting for dim lit areas - Follow up with Neurosurgery team as indicated  Follow Up Plan: Will follow-up next month      PLAN A member of the care management team will follow-up next month.   [facility] [phone]

## 2020-05-14 ENCOUNTER — Ambulatory Visit: Payer: PPO

## 2020-05-14 DIAGNOSIS — I10 Essential (primary) hypertension: Secondary | ICD-10-CM

## 2020-05-14 DIAGNOSIS — E119 Type 2 diabetes mellitus without complications: Secondary | ICD-10-CM

## 2020-05-15 ENCOUNTER — Other Ambulatory Visit: Payer: Self-pay | Admitting: Family Medicine

## 2020-05-15 ENCOUNTER — Telehealth: Payer: Self-pay

## 2020-05-15 DIAGNOSIS — I10 Essential (primary) hypertension: Secondary | ICD-10-CM

## 2020-05-15 DIAGNOSIS — E1169 Type 2 diabetes mellitus with other specified complication: Secondary | ICD-10-CM

## 2020-05-15 MED ORDER — METFORMIN HCL 500 MG PO TABS: ORAL_TABLET | ORAL | 0 refills | Status: DC

## 2020-05-15 MED ORDER — AMILORIDE-HYDROCHLOROTHIAZIDE 5-50 MG PO TABS
0.5000 | ORAL_TABLET | Freq: Every day | ORAL | 1 refills | Status: DC
Start: 2020-05-15 — End: 2020-07-20

## 2020-05-15 MED ORDER — LOSARTAN POTASSIUM 100 MG PO TABS: 100.0000 mg | ORAL_TABLET | Freq: Every day | ORAL | 1 refills | Status: DC

## 2020-05-15 MED ORDER — AMLODIPINE BESYLATE 5 MG PO TABS: 5.0000 mg | ORAL_TABLET | Freq: Every day | ORAL | 1 refills | Status: DC

## 2020-05-15 NOTE — Telephone Encounter (Signed)
[facility] faxed refill request for the following medications:  losartan (COZAAR) 100 MG tablet   metFORMIN (GLUCOPHAGE) 500 MG tablet  amiloride-hydrochlorothiazide (MODURETIC) 5-50 MG tablet  amLODipine (NORVASC) 5 MG tablet   Please advise.

## 2020-05-19 ENCOUNTER — Encounter: Payer: Self-pay | Admitting: Family Medicine

## 2020-05-19 ENCOUNTER — Telehealth (INDEPENDENT_AMBULATORY_CARE_PROVIDER_SITE_OTHER): Payer: Medicare HMO | Admitting: Family Medicine

## 2020-05-19 DIAGNOSIS — R200 Anesthesia of skin: Secondary | ICD-10-CM

## 2020-05-19 DIAGNOSIS — G4486 Cervicogenic headache: Secondary | ICD-10-CM | POA: Diagnosis not present

## 2020-05-19 NOTE — Progress Notes (Signed)
MyChart Video Visit    Virtual Visit via Video Note   This visit type was conducted due to national recommendations for restrictions regarding the COVID-19 Pandemic (e.g. social distancing) in an effort to limit this patient's exposure and mitigate transmission in our community. This patient is at least at moderate risk for complications without adequate follow up. This format is felt to be most appropriate for this patient at this time. Physical exam was limited by quality of the video and audio technology used for the visit.    Patient location: home Provider location: [facility] Persons involved in the visit: patient, provider  I discussed the limitations of evaluation and management by telemedicine and the availability of in person appointments. The patient expressed understanding and agreed to proceed.  Patient: Jessica Wheeler   DOB: 03/10/1941   80 y.o. Female  MRN: [id number] Visit Date: 05/19/2020  Today's healthcare provider: Shirlee Latch, MD   Chief Complaint  Patient presents with  . Headache   Subjective    Headache  This is a chronic problem. The current episode started more than 1 month ago (Headache started after two neck surgeries 7 weeks ago. ). The pain quality is not similar to prior headaches. The quality of the pain is described as aching and dull. Associated symptoms include neck pain, numbness (Part of pt's scalp. ) and scalp tenderness. Pertinent negatives include no blurred vision, dizziness, loss of balance, phonophobia, photophobia, sinus pressure, sore throat, tinnitus or visual change.    Pain meds after surgery did not help the headaches either.  Headache is constant, but waxes and wanes in intensity.  Talked to NSG about this and he explained that some parts of the body wakes up at different times.  She has tried a muscle relaxer but not taking regularly as they made her unsteady and she is scared of falling.  BP is not elevated  on home readings  Thrush has resolved.  Patient Active Problem List   Diagnosis Date Noted  . History of diarrhea 04/10/2020  . Cervical spondylosis with myelopathy and radiculopathy 04/01/2020  . Lumbar adjacent segment disease with spondylolisthesis 03/30/2020  . Overactive bladder 01/17/2020  . Myalgia 12/13/2019  . Hyperlipidemia associated with type 2 diabetes mellitus (HCC) 03/15/2019  . Fatigue 12/11/2017  . Lower extremity edema 06/29/2017  . Primary osteoarthritis of left knee 03/10/2016  . Depression 12/28/2015  . Allergic rhinitis 01/31/2015  . Benign paroxysmal positional nystagmus 01/31/2015  . Bladder infection, chronic 01/31/2015  . Glaucoma 01/31/2015  . Alopecia 01/31/2015  . Chronic hyponatremia 01/31/2015  . Muscle spasm 01/31/2015  . 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 Anxiety 11/04/2014  . Muscle weakness 06/24/2014  . Fibrocystic breast disease 04/30/2013  . 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 Renal artery stenosis (HCC) 04/18/2012  . Chest pain 10/10/2011  . Murmur 10/10/2011  . 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HCC) 03/31/2011  . HTN (hypertension) 08/03/2010  . CAD (coronary artery disease) 08/03/2010  . DM (diabetes mellitus) (HCC) 08/03/2010   Past Medical History:  Diagnosis Date  . Allergy   . Arthritis   . Bundle branch block, right   . Diabetes mellitus   . Diffuse cystic mastopathy   . Dissection, aorta (HCC)    Dr.Dew follows  . Diverticulosis   . Family history of malignant neoplasm of gastrointestinal tract   . Glaucoma 2005  . Heart murmur   . History of bronchitis   . History of pancreatitis 2005  .  Hypertension 1982  . Personal history of tobacco use, presenting hazards to health   . RBBB   . Ulcer    Social History   Socioeconomic History  . Marital status: Widowed    Spouse name: Not on file  . Number of children: 2  . Years of education: 60  . Highest education level: Associate degree: academic  program  Occupational History  . Occupation: Retired  Tobacco Use  . Smoking status: Former Smoker    Packs/day: 1.00    Years: 15.00    Pack years: 15.00    Types: Cigarettes    Quit date: 08/03/2003    Years since quitting: 16.8  . Smokeless tobacco: Never Used  Vaping Use  . Vaping Use: Never used  Substance and Sexual Activity  . Alcohol use: No  . Drug use: No  . Sexual activity: Not on file  Other Topics Concern  . Not on file  Social History Narrative  . Not on file   Social Determinants of Health   Financial Resource Strain: Low Risk   . Difficulty of Paying Living Expenses: Not hard at all  Food Insecurity: No Food Insecurity  . Worried About Programme researcher, broadcasting/film/video in the Last Year: Never true  . Ran Out of Food in the Last Year: Never true  Transportation Needs: No Transportation Needs  . Lack of Transportation (Medical): No  . Lack of Transportation (Non-Medical): No  Physical Activity: Sufficiently Active  . Days of Exercise per Week: 5 days  . Minutes of Exercise per Session: 40 min  Stress: No Stress Concern Present  . Feeling of Stress : Not at all  Social Connections: Socially Isolated  . Frequency of Communication with Friends and Family: More than three times a week  . Frequency of Social Gatherings with Friends and Family: Once a week  . Attends Religious Services: Never  . Active Member of Clubs or Organizations: No  . Attends Banker Meetings: Never  . Marital Status: Widowed  Intimate Partner Violence: Not At Risk  . Fear of Current or Ex-Partner: No  . Emotionally Abused: No  . Physically Abused: No  . Sexually Abused: No   Allergies  Allergen Reactions  . Beta Adrenergic Blockers     Chest pressure/difficulty breathing  . Jardiance [Empagliflozin]     Muscle cramps  . Levaquin [Levofloxacin In D5w]     Low blood sugar; does take this when she needs it. She just makes sure to control her BS to avoid hypoglycemia.  . Lisinopril  Cough  . Zetia [Ezetimibe]     Lethargic   . Prevnar [Pneumococcal 13-Val Conj Vacc] Rash    Joint Pain  . Sulfa Antibiotics Rash    "makes my skin raw"    Medications: Outpatient Medications Prior to Visit  Medication Sig  . amiloride-hydrochlorothiazide (MODURETIC) 5-50 MG tablet Take 0.5 tablets by mouth daily.  Marland Kitchen amLODipine (NORVASC) 5 MG tablet Take 1 tablet (5 mg total) by mouth daily.  Marland Kitchen aspirin 81 MG EC tablet Take 81 mg by mouth daily.    . Cyanocobalamin (VITAMIN B-12) 1000 MCG/15ML LIQD Take 1,000 mcg by mouth daily.   . cyclobenzaprine (FLEXERIL) 5 MG tablet Take 1 tablet (5 mg total) by mouth 3 (three) times daily as needed for muscle spasms.  Marland Kitchen docusate sodium (COLACE) 100 MG capsule Take 1 capsule (100 mg total) by mouth 2 (two) times daily.  Marland Kitchen ezetimibe (ZETIA) 10 MG tablet Take 1 tablet (  10 mg total) by mouth daily. (Patient not taking: Reported on 03/18/2020)  . latanoprost (XALATAN) 0.005 % ophthalmic solution Place 1 drop into both eyes at bedtime.   Marland Kitchen losartan (COZAAR) 100 MG tablet Take 1 tablet (100 mg total) by mouth daily.  . Magnesium 250 MG TABS Take 250 mg by mouth daily.  . metFORMIN (GLUCOPHAGE) 500 MG tablet TAKE 2 TABLETS BY MOUTH TWICE DAILY WITH A MEAL  . [DISCONTINUED] nystatin (MYCOSTATIN) 100000 UNIT/ML suspension Take 5 mLs (500,000 Units total) by mouth 4 (four) times daily. Swish and spit.  . [DISCONTINUED] oxyCODONE-acetaminophen (PERCOCET/ROXICET) 5-325 MG tablet Take 1-2 tablets by mouth every 4 (four) hours as needed for moderate pain. (Patient not taking: Reported on 04/30/2020)   No facility-administered medications prior to visit.    Review of Systems  Constitutional: Negative.   HENT: Negative for sinus pressure, sore throat and tinnitus.   Eyes: Negative.  Negative for blurred vision and photophobia.  Respiratory: Negative.   Gastrointestinal: Negative.   Musculoskeletal: Positive for neck pain.  Neurological: Positive for  light-headedness (Occasionally), numbness (Part of pt's scalp. ) and headaches. Negative for dizziness and loss of balance.      Objective    There were no vitals taken for this visit.   Physical Exam Constitutional:      General: She is not in acute distress.    Appearance: She is not diaphoretic.  HENT:     Head: Normocephalic.  Pulmonary:     Effort: Pulmonary effort is normal. No respiratory distress.  Neurological:     Mental Status: She is alert and oriented to person, place, and time. Mental status is at baseline.        Assessment & Plan     1. Cervicogenic headache - new problem since surgery 7 weeks ago - reports it is associated with tightness of neck muscles and tension on the back of her scalp - suspect tightness after surgery is the likely cause - she will cautiously try half dose of flexeril at bedtime - discussed fall precautions - if persisting, can try nortriptyline - also advised on return precautions and when to f/u with surgeon  2. Numbness - new problem since surgery 7 weeks ago - likely 2/2 surgery - reassurance given - may improve or may not - otherwise neuro intact   Return if symptoms worsen or fail to improve.     I discussed the assessment and treatment plan with the patient. The patient was provided an opportunity to ask questions and all were answered. The patient agreed with the plan and demonstrated an understanding of the instructions.   The patient was advised to call back or seek an in-person evaluation if the symptoms worsen or if the condition fails to improve as anticipated.   I, Shirlee Latch, MD, have reviewed all documentation for this visit. The documentation on 05/19/20 for the exam, diagnosis, procedures, and orders are all accurate and complete.   Ridley Schewe, Marzella Schlein, MD, MPH [facility]

## 2020-05-22 ENCOUNTER — Telehealth: Payer: Self-pay | Admitting: Family Medicine

## 2020-05-22 DIAGNOSIS — E1169 Type 2 diabetes mellitus with other specified complication: Secondary | ICD-10-CM

## 2020-05-22 NOTE — Telephone Encounter (Signed)
[facility] calling to ask for a Accu Check meter, test strips and control solution.  Advised [employer] to have pt give [place] a call.  Pt has new insurance with them this year.

## 2020-05-26 MED ORDER — BLOOD GLUCOSE METER KIT: PACK | 0 refills | Status: DC

## 2020-06-04 ENCOUNTER — Other Ambulatory Visit: Payer: Self-pay

## 2020-06-04 DIAGNOSIS — E1169 Type 2 diabetes mellitus with other specified complication: Secondary | ICD-10-CM

## 2020-06-04 MED ORDER — ACCU-CHEK FASTCLIX LANCETS MISC: 1.0000 | Freq: Every day | 3 refills | Status: DC

## 2020-06-04 MED ORDER — ACCU-CHEK GUIDE W/DEVICE KIT: 1.0000 | PACK | 0 refills | Status: DC

## 2020-06-04 MED ORDER — ACCU-CHEK GUIDE VI STRP: ORAL_STRIP | 12 refills | Status: DC

## 2020-06-04 MED ORDER — ACCU-CHEK FASTCLIX LANCET KIT: 1.0000 | PACK | 1 refills | Status: DC

## 2020-06-22 NOTE — Patient Instructions (Signed)
Thank you for allowing the Chronic Care Management team to participate in your care.  Patient Care Plan: Diabetes Type 2 (Adult)    Problem Identified: Disease Progression (Diabetes, Type 2)     Long-Range Goal: Disease Progression Prevented or Minimized   Start Date: 04/30/2020  Expected End Date: 08/29/2020  Priority: High  Note:   Objective:  Lab Results  Component Value Date   HGBA1C 6.8 (H) 03/30/2020 .   Lab Results  Component Value Date   CREATININE 0.81 03/31/2020   CREATININE 0.81 03/24/2020   CREATININE 0.78 03/13/2020 .   Marland Kitchen No results found for: EGFR   Current Barriers:  . Chronic disease management and support related to Diabetes self-management  Case Manager Clinical Goal(s):  Over the next 120 days, patient will: Marland Kitchen Demonstrate continued adherence to prescribed treatment plan for Diabetes self management as evidenced by daily monitoring and recording of CBG, adherence to ADA/ carb modified diet and adherence to prescribed medication regimen.  Interventions:  . Collaboration with Virginia Crews, MD regarding development and update of comprehensive plan of care as evidenced by provider attestation and co-signature . Inter-disciplinary care team collaboration (see longitudinal plan of care) . Reviewed medications. Encouraged to continue taking medications as prescribed and notify team with concerns regarding prescription cost. . Discussed blood glucose readings and nutritional intake. Reviewed s/sx of hypoglycemia and hyperglycemia along with recommended interventions. Reports a 12 lb weight loss d/t decreased intake following her recent procedure (posterior cervical fusion) She experienced difficulty chewing and swallowing  d/t pain r/t thrush and a sore throat. Reports fasting blood glucose readings remained within range despite decreased intake. Readings have ranged from the 110's to low 120's. Reports thrush and sore throat have resolved. She is tolerating small  meals and supplementing with Max Protein Ensure as needed. . Discussed importance of completing recommended DM preventive care. She continues to perform foot care and completes exams as recommended.   Patient Goals/Self-Care Activities Over the next 120 days, patient will:  - Continue administering medications as prescribed - Attend all scheduled provider appointments - Monitor blood glucose levels consistently and utilize recommended interventions - Continue adherence to prescribed ADA/carb modified - Notify provider or care management team with health related questions and concern as needed  Follow Up Plan:  -Will follow up next month.      Patient Care Plan: Hypertension (Adult)    Problem Identified: Hypertension (Hypertension)     Long-Range Goal: Hypertension Monitored   Start Date: 04/30/2020  Expected End Date: 08/29/2020  Priority: High  Note:   Objective:  . Last practice recorded BP readings:  BP Readings from Last 3 Encounters:  04/03/20 (!) 164/69  03/24/20 (!) 158/69  03/13/20 129/65 .   Marland Kitchen Most recent eGFR/CrCl: No results found for: EGFR  No components found for: CRCL Current Barriers:  . Chronic Disease Management needs and support related Hypertension management.  Case Manager Clinical Goal(s):  Marland Kitchen Over the next 120 days, patient will demonstrate adherence to prescribed treatment plan for hypertension as evidenced by taking all medications as prescribed, monitoring and recording blood pressure, and adhering to a cardiac prudent/heart healthy diet.  Interventions:  . Collaboration with Virginia Crews, MD regarding development and update of comprehensive plan of care as evidenced by provider attestation and co-signature . Inter-disciplinary care team collaboration (see longitudinal plan of care) . Reviewed medications. Encouraged to continue taking medications as prescribed and notify provider if unable to tolerate prescribed regimen. . Reviewed  established  blood pressure range and reviewed recent home readings. Continues to monitor BP as advised. Reports systolic readings have ranged in the 120's to low 824'M Diastolic readings have ranged in the 60's and 70's.  . Remains compliant with treatment recommendations. Adhering to recommended diet. Activity is limited d/t recent lumbar procedure, however she continues to walk at least 20 minutes a day in her driveway.  Patient Goals:Self-Care Activities: Over the next 120 days, patient will: - Continue administering medications as prescribed - Attend all scheduled provider appointments - Continue monitoring and recording blood pressure - Continue adherence to recommended diet - Contact provider or care management team with questions or new concerns.  Follow Up Plan:  Will follow-up next month   Patient Care Plan: Increased Risk for Falls    Problem Identified: Mobility and Function     Long-Range Goal: Maintain Mobility and Function   Start Date: 04/30/2020  Expected End Date: 06/30/2020  Priority: High  Note:   Current Barriers:  . S/P Posterior Cervical Fusion. Increased risk for falls d/t limited mobility.  Clinical Goal(s):  Marland Kitchen Over the next 60 days patient will not experience falls or require emergent care d/t fall related injuries.  Interventions:  . Collaboration with Virginia Crews, MD regarding development and update of comprehensive plan of care as evidenced by provider attestation and co-signature . Inter-disciplinary care team collaboration (see longitudinal plan of care) . Provided information regarding home safety and fall prevention measures . Reviewed medications and discussed potential side effects of medications such as dizziness and drowsiness. Reports pain is well controlled with Meloxicam. Reports not requiring narcotics for pain control. Verbalizes need to use caution and avoid activity if prescribed narcotic is needed. . Discussed recommendations as  outlined by the Neurosurgery team. Encouraged to follow the activity restrictions as advised and avoid over strenuous tasks. Encouraged to continue using assistive device and wearing Aspen brace as advised. . Discussed ability to perform ADL's and possible need for in-home assistance. Denies falls since her lumbar procedure. Reports ambulating well in her home. She has arranged for assistance with light housekeeping at least once a week. Declines need for additional in-home assistance. Reports having a very good support system with neighbors and friends being readily available if needed.  Patient Goals/Self-Care Activities Over the next 60 days, patient will:   - Utilize assistive device appropriately with all ambulation - Change positions slowly and avoid over strenuous activities - Wear secure non skid footwear when ambulating - Keep pathways clear and utilize home lighting for dim lit areas - Follow up with Neurosurgery team as indicated  Follow Up Plan: Will follow-up next month      Mrs. Harmes verbalized understanding of the information discussed during the telephonic outreach today. Declined need for mailed/printed resources. A member of the care management team will follow-up next month.   [facility] Care Management [facility] [phone]

## 2020-07-05 NOTE — Chronic Care Management (AMB) (Signed)
Chronic Care Management   Follow Up Note    Name: Jessica Wheeler MRN: [id number] DOB: 12/11/1940  Primary Care Provider: Virginia Crews, MD Reason for referral : Chronic Care Management   Jessica Wheeler is a 80 y.o. year old female who is a primary care patient of Virginia Crews, MD. She is currently enrolled in the Chronic Care Management program.  Review of Jessica Wheeler's status, including review of consultants reports, relevant labs and test results was conducted today. Collaboration with appropriate care team members was performed as part of the comprehensive evaluation and provision of chronic care management services.    SDOH (Social Determinants of Health) assessments performed: No    Outpatient Encounter Medications as of 05/14/2020  Medication Sig  . aspirin 81 MG EC tablet Take 81 mg by mouth daily.    . Cyanocobalamin (VITAMIN B-12) 1000 MCG/15ML LIQD Take 1,000 mcg by mouth daily.   . cyclobenzaprine (FLEXERIL) 5 MG tablet Take 1 tablet (5 mg total) by mouth 3 (three) times daily as needed for muscle spasms.  Marland Kitchen docusate sodium (COLACE) 100 MG capsule Take 1 capsule (100 mg total) by mouth 2 (two) times daily.  Marland Kitchen ezetimibe (ZETIA) 10 MG tablet Take 1 tablet (10 mg total) by mouth daily. (Patient not taking: Reported on 03/18/2020)  . latanoprost (XALATAN) 0.005 % ophthalmic solution Place 1 drop into both eyes at bedtime.   . Magnesium 250 MG TABS Take 250 mg by mouth daily.  . [DISCONTINUED] amiloride-hydrochlorothiazide (MODURETIC) 5-50 MG tablet TAKE 1/2 (ONE-HALF) TABLET BY MOUTH ONCE DAILY AS NEEDED FOR SWELLING (Patient taking differently: Take 0.5 tablets by mouth daily. )  . [DISCONTINUED] amLODipine (NORVASC) 5 MG tablet Take 1 tablet by mouth once daily (Patient taking differently: Take 5 mg by mouth daily. )  . [DISCONTINUED] losartan (COZAAR) 100 MG tablet Take 1 tablet by mouth once daily (Patient taking differently: Take 100 mg by mouth daily. )  .  [DISCONTINUED] metFORMIN (GLUCOPHAGE) 500 MG tablet TAKE 2 TABLETS BY MOUTH TWICE DAILY WITH MEALS (Patient taking differently: Take 1,000 mg by mouth 2 (two) times daily. )  . [DISCONTINUED] nystatin (MYCOSTATIN) 100000 UNIT/ML suspension Take 5 mLs (500,000 Units total) by mouth 4 (four) times daily. Swish and spit.  . [DISCONTINUED] oxyCODONE-acetaminophen (PERCOCET/ROXICET) 5-325 MG tablet Take 1-2 tablets by mouth every 4 (four) hours as needed for moderate pain. (Patient not taking: Reported on 04/30/2020)   No facility-administered encounter medications on file as of 05/14/2020.     Objective:  Patient Care Plan: Diabetes Type 2 (Adult)    Problem Identified: Disease Progression (Diabetes, Type 2)     Long-Range Goal: Disease Progression Prevented or Minimized   Start Date: 04/30/2020  Expected End Date: 08/29/2020  Priority: High  Note:   Objective:  Lab Results  Component Value Date   HGBA1C 6.8 (H) 03/30/2020 .   Lab Results  Component Value Date   CREATININE 0.81 03/31/2020   CREATININE 0.81 03/24/2020   CREATININE 0.78 03/13/2020 .   Marland Kitchen No results found for: EGFR   Current Barriers:  . Chronic disease management and support related to Diabetes self-management  Case Manager Clinical Goal(s):  Over the next 120 days, patient will: Marland Kitchen Demonstrate continued adherence to prescribed treatment plan for Diabetes self management as evidenced by daily monitoring and recording of CBG, adherence to ADA/ carb modified diet and adherence to prescribed medication regimen.  Interventions:  . Collaboration with Virginia Crews, MD regarding development and  update of comprehensive plan of care as evidenced by provider attestation and co-signature . Inter-disciplinary care team collaboration (see longitudinal plan of care) . Reviewed medications. Encouraged to continue taking medications as prescribed and notify team with concerns regarding prescription cost. . Discussed blood  glucose readings and nutritional intake. Reviewed s/sx of hypoglycemia and hyperglycemia along with recommended interventions. Reports a 12 lb weight loss d/t decreased intake following her recent procedure (posterior cervical fusion) She experienced difficulty chewing and swallowing  d/t pain r/t thrush and a sore throat. Reports fasting blood glucose readings remained within range despite decreased intake. Readings have ranged from the 110's to low 120's. Reports thrush and sore throat have resolved. She is tolerating small meals and supplementing with Max Protein Ensure as needed. . Discussed importance of completing recommended DM preventive care. She continues to perform foot care and completes exams as recommended.   Patient Goals/Self-Care Activities Over the next 120 days, patient will:  - Continue administering medications as prescribed - Attend all scheduled provider appointments - Monitor blood glucose levels consistently and utilize recommended interventions - Continue adherence to prescribed ADA/carb modified - Notify provider or care management team with questions and new concerns as needed.  Follow Up Plan:  -Will follow up within 60 days.       Patient Care Plan: Hypertension (Adult)    Problem Identified: Hypertension (Hypertension)     Long-Range Goal: Hypertension Monitored   Start Date: 04/30/2020  Expected End Date: 08/29/2020  Priority: High  Note:   Objective:  . Last practice recorded BP readings:  BP Readings from Last 3 Encounters:  04/03/20 (!) 164/69  03/24/20 (!) 158/69  03/13/20 129/65 .   Marland Kitchen Most recent eGFR/CrCl: No results found for: EGFR  No components found for: CRCL Current Barriers:  . Chronic Disease Management needs and support related Hypertension management.  Case Manager Clinical Goal(s):   Over the next 120 days, patient will demonstrate adherence to prescribed treatment plan for hypertension as evidenced by taking all medications as  prescribed, monitoring and recording blood pressure, and adhering to a cardiac prudent/heart healthy diet.  Interventions:  . Collaboration with Virginia Crews, MD regarding development and update of comprehensive plan of care as evidenced by provider attestation and co-signature . Inter-disciplinary care team collaboration (see longitudinal plan of care) . Reviewed medications. Encouraged to continue taking medications as prescribed and notify provider if unable to tolerate prescribed regimen. . Reviewed established blood pressure range and reviewed recent home readings. Continues to monitor BP as advised. Reports systolic readings have ranged in the 120's to low 300'T Diastolic readings have ranged in the 60's and 70's.  . Remains compliant with treatment recommendations. Adhering to recommended diet. Activity is limited d/t recent lumbar procedure, however she continues to walk at least 20 minutes a day in her driveway.  Patient Goals:Self-Care Activities: Over the next 120 days, patient will: - Continue administering medications as prescribed - Attend all scheduled provider appointments - Continue monitoring and recording blood pressure - Continue adherence to recommended diet - Contact provider or care management team with questions and new concerns as needed  Follow Up Plan: Will follow up within 60 days   Patient Care Plan: Increased Risk for Falls    Problem Identified: Mobility and Function     Long-Range Goal: Maintain Mobility and Function   Start Date: 04/30/2020  Expected End Date: 06/30/2020  Priority: High  Note:   Current Barriers:  . S/P Posterior Cervical Fusion. Increased risk  for falls d/t limited mobility.  Clinical Goal(s):  Marland Kitchen Over the next 90 days patient will not experience falls or require emergent care d/t fall related injuries.  Interventions:  . Collaboration with Virginia Crews, MD regarding development and update of comprehensive plan of  care as evidenced by provider attestation and co-signature . Inter-disciplinary care team collaboration (see longitudinal plan of care) . Provided information regarding home safety and fall prevention measures . Reviewed medications and discussed potential side effects of medications such as dizziness and drowsiness. Reports pain is well controlled with Meloxicam. Reports not requiring narcotics for pain control. Verbalizes need to use caution and avoid activity if prescribed narcotic is needed. . Discussed recommendations as outlined by the Neurosurgery team. Encouraged to follow the activity restrictions as advised and avoid over strenuous tasks. Encouraged to continue using assistive device and wearing Aspen brace as advised. . Discussed ability to perform ADL's and possible need for in-home assistance. Denies falls since her lumbar procedure. Reports ambulating well in her home. She has arranged for assistance with light housekeeping at least once a week. Declines need for additional in-home assistance. Reports having a very good support system with neighbors and friends being readily available if needed.   Patient Goals/Self-Care Activities Over the next 60 days, patient will:   - Utilize assistive device appropriately with all ambulation - Change positions slowly and avoid over strenuous activities - Wear secure non skid footwear when ambulating - Keep pathways clear and utilize home lighting for dim lit areas - Follow up with Neurosurgery team as indicated  Follow Up Plan: Will follow up within the next 60 days      PLAN A member of the care management team will follow-up with Jessica Wheeler within the next 60 days.   [facility] Care Management [facility] [phone]

## 2020-07-05 NOTE — Patient Instructions (Addendum)
Thank you for allowing the Chronic Care Management team to participate in your care.     Patient Care Plan: Diabetes Type 2 (Adult)    Problem Identified: Disease Progression (Diabetes, Type 2)     Long-Range Goal: Disease Progression Prevented or Minimized   Start Date: 04/30/2020  Expected End Date: 08/29/2020  Priority: High  Note:   Objective:  Lab Results  Component Value Date   HGBA1C 6.8 (H) 03/30/2020 .   Lab Results  Component Value Date   CREATININE 0.81 03/31/2020   CREATININE 0.81 03/24/2020   CREATININE 0.78 03/13/2020 .   Marland Kitchen No results found for: EGFR   Current Barriers:  . Chronic disease management and support related to Diabetes self-management  Case Manager Clinical Goal(s):  Over the next 120 days, patient will: Marland Kitchen Demonstrate continued adherence to prescribed treatment plan for Diabetes self management as evidenced by daily monitoring and recording of CBG, adherence to ADA/ carb modified diet and adherence to prescribed medication regimen.  Interventions:  . Collaboration with Virginia Crews, MD regarding development and update of comprehensive plan of care as evidenced by provider attestation and co-signature . Inter-disciplinary care team collaboration (see longitudinal plan of care) . Reviewed medications. Encouraged to continue taking medications as prescribed and notify team with concerns regarding prescription cost. . Discussed blood glucose readings and nutritional intake. Reviewed s/sx of hypoglycemia and hyperglycemia along with recommended interventions. Reports a 12 lb weight loss d/t decreased intake following her recent procedure (posterior cervical fusion) She experienced difficulty chewing and swallowing  d/t pain r/t thrush and a sore throat. Reports fasting blood glucose readings remained within range despite decreased intake. Readings have ranged from the 110's to low 120's. Reports thrush and sore throat have resolved. She is tolerating  small meals and supplementing with Max Protein Ensure as needed. . Discussed importance of completing recommended DM preventive care. She continues to perform foot care and completes exams as recommended.   Patient Goals/Self-Care Activities Over the next 120 days, patient will:  - Continue administering medications as prescribed - Attend all scheduled provider appointments - Monitor blood glucose levels consistently and utilize recommended interventions - Continue adherence to prescribed ADA/carb modified - Notify provider or care management team with questions and new concerns as needed.  Follow Up Plan:  -Will follow up within 60 days.       Patient Care Plan: Hypertension (Adult)    Problem Identified: Hypertension (Hypertension)     Long-Range Goal: Hypertension Monitored   Start Date: 04/30/2020  Expected End Date: 08/29/2020  Priority: High  Note:   Objective:  . Last practice recorded BP readings:  BP Readings from Last 3 Encounters:  04/03/20 (!) 164/69  03/24/20 (!) 158/69  03/13/20 129/65 .   Marland Kitchen Most recent eGFR/CrCl: No results found for: EGFR  No components found for: CRCL Current Barriers:  . Chronic Disease Management needs and support related Hypertension management.  Case Manager Clinical Goal(s):   Over the next 120 days, patient will demonstrate adherence to prescribed treatment plan for hypertension as evidenced by taking all medications as prescribed, monitoring and recording blood pressure, and adhering to a cardiac prudent/heart healthy diet.  Interventions:  . Collaboration with Virginia Crews, MD regarding development and update of comprehensive plan of care as evidenced by provider attestation and co-signature . Inter-disciplinary care team collaboration (see longitudinal plan of care) . Reviewed medications. Encouraged to continue taking medications as prescribed and notify provider if unable to tolerate prescribed regimen. Marland Kitchen  Reviewed  established blood pressure range and reviewed recent home readings. Continues to monitor BP as advised. Reports systolic readings have ranged in the 120's to low 354'S Diastolic readings have ranged in the 60's and 70's.  . Remains compliant with treatment recommendations. Adhering to recommended diet. Activity is limited d/t recent lumbar procedure, however she continues to walk at least 20 minutes a day in her driveway.  Patient Goals:Self-Care Activities: Over the next 120 days, patient will: - Continue administering medications as prescribed - Attend all scheduled provider appointments - Continue monitoring and recording blood pressure - Continue adherence to recommended diet - Contact provider or care management team with questions and new concerns as needed  Follow Up Plan: Will follow up within 60 days   Patient Care Plan: Increased Risk for Falls    Problem Identified: Mobility and Function     Long-Range Goal: Maintain Mobility and Function   Start Date: 04/30/2020  Expected End Date: 06/30/2020  Priority: High  Note:   Current Barriers:  . S/P Posterior Cervical Fusion. Increased risk for falls d/t limited mobility.  Clinical Goal(s):  Marland Kitchen Over the next 90 days patient will not experience falls or require emergent care d/t fall related injuries.  Interventions:  . Collaboration with Virginia Crews, MD regarding development and update of comprehensive plan of care as evidenced by provider attestation and co-signature . Inter-disciplinary care team collaboration (see longitudinal plan of care) . Provided information regarding home safety and fall prevention measures . Reviewed medications and discussed potential side effects of medications such as dizziness and drowsiness. Reports pain is well controlled with Meloxicam. Reports not requiring narcotics for pain control. Verbalizes need to use caution and avoid activity if prescribed narcotic is needed. . Discussed  recommendations as outlined by the Neurosurgery team. Encouraged to follow the activity restrictions as advised and avoid over strenuous tasks. Encouraged to continue using assistive device and wearing Aspen brace as advised. . Discussed ability to perform ADL's and possible need for in-home assistance. Denies falls since her lumbar procedure. Reports ambulating well in her home. She has arranged for assistance with light housekeeping at least once a week. Declines need for additional in-home assistance. Reports having a very good support system with neighbors and friends being readily available if needed.   Patient Goals/Self-Care Activities Over the next 60 days, patient will:   - Utilize assistive device appropriately with all ambulation - Change positions slowly and avoid over strenuous activities - Wear secure non skid footwear when ambulating - Keep pathways clear and utilize home lighting for dim lit areas - Follow up with Neurosurgery team as indicated  Follow Up Plan: Will follow up within the next 60 days       Mrs. Vesey verbalized understanding of the information discussed during the telephonic outreach today. Declined need for mailed/printed instructions. A member of the care management team will follow-up with Mrs. Lesage within the next 60 days.    [facility] Care Management [facility] [phone]

## 2020-07-07 NOTE — Progress Notes (Unsigned)
Subjective:   Jessica Wheeler is a 80 y.o. female who presents for Medicare Annual (Subsequent) preventive examination.  I connected with Jessica Wheeler today by telephone and verified that I am speaking with the correct person using two identifiers. Location patient: home Location provider: work Persons participating in the virtual visit: patient, provider.   I discussed the limitations, risks, security and privacy concerns of performing an evaluation and management service by telephone and the availability of in person appointments. I also discussed with the patient that there may be a patient responsible charge related to this service. The patient expressed understanding and verbally consented to this telephonic visit.    Interactive audio and video telecommunications were attempted between this provider and patient, however failed, due to patient having technical difficulties OR patient did not have access to video capability.  We continued and completed visit with audio only.   Review of Systems    N/A  Cardiac Risk Factors include: advanced age (>85mn, >>44women);diabetes mellitus;dyslipidemia;hypertension     Objective:    Today's Vitals   07/08/20 0956  PainSc: 2    There is no height or weight on file to calculate BMI.  Advanced Directives 07/08/2020 03/30/2020 03/24/2020 12/10/2019 09/21/2019 07/03/2019 07/02/2018  Does Patient Have a Medical Advance Directive? Yes Yes Yes Yes No Yes Yes  Type of AParamedicof AFitzh[place]Living will [place]JenkintownLiving will HRanchos de Ta[place]Living will Living will - HStar HarborLiving will HRyeLiving will  Does patient want to make changes to medical advance directive? - No - Patient declined - - - - -  Copy of [place] Chart? No - copy requested - - - - No - copy requested No - copy requested  Pre-existing out of facility DNR order  (yellow form or pink MOST form) - - - - - - -    Current Medications (verified) Outpatient Encounter Medications as of 07/08/2020  Medication Sig  . Accu-Chek FastClix Lancets MISC 1 strip by Does not apply route daily.  .Marland Kitchenamiloride-hydrochlorothiazide (MODURETIC) 5-50 MG tablet Take 0.5 tablets by mouth daily.  .Marland KitchenamLODipine (NORVASC) 5 MG tablet Take 1 tablet (5 mg total) by mouth daily.  .Marland Kitchenaspirin 81 MG EC tablet Take 81 mg by mouth daily.  . Blood Glucose Monitoring Suppl (ACCU-CHEK GUIDE) w/Device KIT 1 each by Does not apply route as directed.  . Cyanocobalamin (VITAMIN B-12) 1000 MCG/15ML LIQD Take 1,000 mcg by mouth daily.   . cyclobenzaprine (FLEXERIL) 5 MG tablet Take 1 tablet (5 mg total) by mouth 3 (three) times daily as needed for muscle spasms.  .Marland Kitchenglucose blood (ACCU-CHEK GUIDE) test strip Use as instructed  . ibuprofen (ADVIL) 200 MG tablet Take 200 mg by mouth daily at 6 (six) AM.  . Lancets Misc. (ACCU-CHEK FASTCLIX LANCET) KIT 1 each by Does not apply route as directed.  . latanoprost (XALATAN) 0.005 % ophthalmic solution Place 1 drop into both eyes at bedtime.   .Marland Kitchenlosartan (COZAAR) 100 MG tablet Take 1 tablet (100 mg total) by mouth daily.  . Magnesium 250 MG TABS Take 250 mg by mouth daily.  . metFORMIN (GLUCOPHAGE) 500 MG tablet TAKE 2 TABLETS BY MOUTH TWICE DAILY WITH A MEAL  . blood glucose meter kit and supplies Dispense based on patient and insurance preference. Use to check fasting blood sugar daily. (FOR ICD-10 E10.9, E11.9). (Patient not taking: Reported on 07/08/2020)  . ezetimibe (  ZETIA) 10 MG tablet Take 1 tablet (10 mg total) by mouth daily. (Patient not taking: No sig reported)  . [DISCONTINUED] docusate sodium (COLACE) 100 MG capsule Take 1 capsule (100 mg total) by mouth 2 (two) times daily.   No facility-administered encounter medications on file as of 07/08/2020.    Allergies (verified) Beta adrenergic blockers, Jardiance [empagliflozin], Levaquin  [levofloxacin in d5w], Lisinopril, Zetia [ezetimibe], Prevnar [pneumococcal 13-val conj vacc], and Sulfa antibiotics   History: Past Medical History:  Diagnosis Date  . Allergy   . Arthritis   . Bundle branch block, right   . C7 cervical fracture ([place])   . Diabetes mellitus   . Diffuse cystic mastopathy   . 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 Diverticulosis   . Family history of malignant neoplasm of gastrointestinal tract   . Glaucoma 2005  . Heart murmur   . History of bronchitis   . History of pancreatitis 2005  . Hyperlipidemia   . Hypertension 1982  . Personal history of tobacco use, presenting hazards to health   . RBBB   . Ulcer    Past Surgical History:  Procedure Laterality Date  . ABDOMINAL HYSTERECTOMY  1991  . ANTERIOR CERVICAL CORPECTOMY N/A 03/30/2020   Procedure: CORPECTOMY CERVICAL FOUR - CERVICAL FIVE;  Surgeon: Newman Pies, MD;  Location: [facility];  Service: Neurosurgery;  Laterality: N/A;  anterior  . APPENDECTOMY    . [place]   for lumbar ruptured disc. No hardware, no discectomy, no fusion  . BLADDER SURGERY    . BREAST BIOPSY Right 1980's   benign  . BREAST EXCISIONAL BIOPSY Right 1980s   benign  . BREAST SURGERY     biopsy  . CARDIAC CATHETERIZATION     Dr. Ubaldo Glassing, few years ago  . CATARACT EXTRACTION W/PHACO Left 08/31/2015   Procedure: CATARACT EXTRACTION PHACO AND INTRAOCULAR LENS PLACEMENT (IOC);  Surgeon: Ronnell Freshwater, MD;  Location: [facility];  Service: Ophthalmology;  Laterality: Left;  DIABETIC - oral meds  . CATARACT EXTRACTION W/PHACO Right 10/12/2015   Procedure: CATARACT EXTRACTION PHACO AND INTRAOCULAR LENS PLACEMENT ([place]) right eye;  Surgeon: Ronnell Freshwater, MD;  Location: [facility];  Service: Ophthalmology;  Laterality: Right;  DIABETIC - oral meds  . CHOLECYSTECTOMY  1994  . COLONOSCOPY  2008,02/22/2012   Dr. Sankar-2013  . COLONOSCOPY WITH PROPOFOL N/A 04/04/2017    Procedure: COLONOSCOPY WITH PROPOFOL;  Surgeon: Christene Lye, MD;  Location: [facility];  Service: Endoscopy;  Laterality: N/A;  . POSTERIOR CERVICAL FUSION/FORAMINOTOMY N/A 04/01/2020   Procedure: POSTERIOR CERVICAL FUSION/FORAMINOTOMY CERVICAL FOUR- CERVICAL SIX;  Surgeon: Newman Pies, MD;  Location: [facility];  Service: Neurosurgery;  Laterality: N/A;  . REFRACTIVE SURGERY     Family History  Problem Relation Age of Onset  . Diabetes Mother   . Hypertension Mother   . Anxiety disorder Mother   . Heart disease Father        rheumatic heart disease  . Heart attack Father   . Rheum arthritis Father   . Colon cancer Brother   . Hypertension Maternal Grandmother   . Hypertension Maternal Grandfather   . Breast cancer Maternal Aunt 60  . Breast cancer Cousin 56   Social History   Socioeconomic History  . Marital status: Widowed    Spouse name: Not on file  . Number of children: 2  . Years of education: 74  . Highest education level: Associate degree: academic  program  Occupational History  . Occupation: Retired  Tobacco Use  . Smoking status: Former Smoker    Packs/day: 1.00    Years: 15.00    Pack years: 15.00    Types: Cigarettes    Quit date: 08/03/2003    Years since quitting: 16.9  . Smokeless tobacco: Never Used  Vaping Use  . Vaping Use: Never used  Substance and Sexual Activity  . Alcohol use: No  . Drug use: No  . Sexual activity: Not on file  Other Topics Concern  . Not on file  Social History Narrative  . Not on file   Social Determinants of Health   Financial Resource Strain: Low Risk   . Difficulty of Paying Living Expenses: Not very hard  Food Insecurity: No Food Insecurity  . Worried About Charity fundraiser in the Last Year: Never true  . Ran Out of Food in the Last Year: Never true  Transportation Needs: No Transportation Needs  . Lack of Transportation (Medical): No  . Lack of Transportation (Non-Medical): No  Physical Activity:  Insufficiently Active  . Days of Exercise per Week: 4 days  . Minutes of Exercise per Session: 30 min  Stress: Stress Concern Present  . Feeling of Stress : To some extent  Social Connections: Socially Isolated  . Frequency of Communication with Friends and Family: More than three times a week  . Frequency of Social Gatherings with Friends and Family: Once a week  . Attends Religious Services: Never  . Active Member of Clubs or Organizations: No  . Attends Archivist Meetings: Never  . Marital Status: Widowed    Tobacco Counseling Counseling given: Not Answered   Clinical Intake:  Pre-visit preparation completed: Yes  Pain : 0-10 Pain Score: 2  Pain Type: Chronic pain Pain Location: Neck Pain Orientation: Posterior Pain Descriptors / Indicators: Aching,Shooting,Sharp,Dull Pain Frequency: Intermittent Pain Relieving Factors: Takes Advil as needed for pain.  Pain Relieving Factors: Takes Advil as needed for pain.  Nutritional Risks: None Diabetes: Yes  How often do you need to have someone help you when you read instructions, pamphlets, or other written materials from your doctor or pharmacy?: 1 - Never  Diabetic? Yes  Nutrition Risk Assessment:  Has the patient had any N/V/D within the last 2 months?  No  Does the patient have any non-healing wounds?  No  Has the patient had any unintentional weight loss or weight gain?  No   Diabetes:  Is the patient diabetic?  Yes  If diabetic, was a CBG obtained today?  No  Did the patient bring in their glucometer from home?  No  How often do you monitor your CBG's? Once a day in the AM and occasionally twice.   Financial Strains and Diabetes  Management:  Are you having any financial strains with the device, your supplies or your medication? No .  Does the patient want to be seen by Chronic Care Management for management of their diabetes?  No  Would the patient like to be referred to a Nutritionist or for  Diabetic Management?  No   Diabetic Exams:  Diabetic Eye Exam: Overdue for diabetic eye exam. Pt has been advised about the importance in completing this exam. Patient advised to call and schedule an eye exam. Diabetic Foot Exam: Overdue, Pt has been advised about the importance in completing this exam.    Interpreter Needed?: No  Information entered by :: [facility], LPN   Activities of Daily  Living In your present state of health, do you have any difficulty performing the following activities: 07/08/2020 03/30/2020  Hearing? Y Y  Comment Due to sinus drainage. -  Vision? N N  Difficulty concentrating or making decisions? N N  Walking or climbing stairs? N N  Comment - -  Dressing or bathing? N Y  Comment - -  Doing errands, shopping? N [place] and eating ? N -  Using the Toilet? N -  In the past six months, have you accidently leaked urine? N -  Do you have problems with loss of bowel control? N -  Managing your Medications? N -  Managing your Finances? N -  Housekeeping or managing your Housekeeping? N -  Some recent data might be hidden    Patient Care Team: Bacigalupo, [name], MD as PCP - General (Family Medicine) Minna Merritts, MD as Consulting Physician (Cardiology) Felipa Furnace, DPM as Consulting Physician (Podiatry) Idelle Leech, OD (Optometry) Neldon Labella, RN as Case Manager Susa Day, MD as Consulting Physician (Orthopedic Surgery) Newman Pies, MD as Consulting Physician (Neurosurgery) Clyde Canterbury, MD as Referring Physician (Otolaryngology)  Indicate any recent Medical Services you may have received from other than [facility] providers in the past year (date may be approximate).     Assessment:   This is a routine wellness examination for [place].  Hearing/Vision screen No exam data present  Dietary issues and exercise activities discussed: Current Exercise Habits: Home exercise routine, Type of exercise:  walking, Time (Minutes): 35, Frequency (Times/Week): 4, Weekly Exercise (Minutes/Week): 140, Intensity: Mild, Exercise limited by: None identified  Goals    . Chronic Disease Management     CARE PLAN ENTRY (see longitudinal plan of care for additional care plan information)  Current Barriers:  . Chronic Disease Management support and education needs related to HTN, CAD, DM and Hyperlipidemia. (Recent C-Spine Injury)  Case Manager Clinical Goal(s):  Marland Kitchen Over the next 120 days, patient will not require hospitalization or emergent care d/t complications r/t chronic illnesses. . Over the next 90 days, patient will take all medications as prescribed. . Over the next 90 days, patient will attend all appointments as scheduled. . Over the next 90 days, patient will monitor blood pressure and record readings. . Over the next 90 days, patient will adhere to recommended cardiac prudent/diabetic diet. . Over the next 90 days, patient will follow recommended safety measures to prevent falls and injuries. . Over the next 60 days, patient will adhere to treatment plan outlined by the Orthopedic surgical team. . Over the next 30 days, patient will update care management team if in-home assistance is needed.   Interventions:  . Inter-disciplinary care team collaboration (see longitudinal plan of care) . Reviewed medications, home BP and blood glucose monitoring. Continues to take medications as prescribed and monitoring BP and FBS as recommended. Reports FBS readings in the 120's. Denies s/sx r/t hypoglycemia or hyperglycemia. Noted some slightly elevated systolic BP readings in the 140's but reports most readings have been within range. No chest pain, palpitations, dizziness or headaches.   . Discussed current activity tolerance. Reviewed safety and fall prevention measures. Reports being very active and following the recommended activity restrictions. Reports regularly walking 2 to 2 1/5 miles without  difficulty. Denies falls or changes in activity tolerance.   . Discussed pending procedures with the Neurosurgery team and plans for care management follow up. Pending Neurosurgical procedures on 03/30/20 and 04/01/20.  We discussed possible need for in-home assistance after discharge. She prefers to receive Home Health services versus  transitioning to an rehabilitation facility. She plans to discuss this with the Neurosurgery team. She reports having very good support. Anticipates family and neighbors being available to assist if needed. She does not anticipate a need for additional in-home assistance but agreed to update the care management team if this changes.   Patient Self Care Activities:  . Self administers medications  . Attends scheduled provider appointments . Calls pharmacy for medication refills . Performs ADL's independently   Please see past updates related to this goal by clicking on the "Past Updates" button in the selected goal      . DIET - INCREASE WATER INTAKE     Recommend increasing water intake to 6-8 8 oz glasses a day.       Depression Screen PHQ 2/9 Scores 07/08/2020 03/13/2020 01/17/2020 10/10/2019 07/03/2019 07/03/2018 07/03/2018  PHQ - 2 Score 0 0 2 1 0 0 0  PHQ- 9 Score - 3 6 - - 0 -    Fall Risk Fall Risk  07/08/2020 03/13/2020 01/17/2020 10/10/2019 07/03/2019  Falls in the past year? 0 0 0 0 0  Number falls in past yr: 0 0 0 - 0  Injury with Fall? 0 0 0 - 0  Comment - - - - -  Risk for fall due to : - No Fall Risks - Impaired balance/gait;Medication side effect;Orthopedic patient -  Risk for fall due to: Comment - - - Recent C-Spine risk -  Follow up - Falls evaluation completed - Falls prevention discussed -    FALL RISK PREVENTION PERTAINING TO THE HOME:  Any stairs in or around the home? Yes  If so, are there any without handrails? No  Home free of loose throw rugs in walkways, pet beds, electrical cords, etc? Yes  Adequate lighting in your home to reduce  risk of falls? Yes   ASSISTIVE DEVICES UTILIZED TO PREVENT FALLS:  Life alert? No  Use of a cane, walker or w/c? No  Grab bars in the bathroom? No  Shower chair or bench in shower? Yes  Elevated toilet seat or a handicapped toilet? No    Cognitive Function: Normal cognitive status assessed by observation by this Nurse Health Advisor. No abnormalities found.       6CIT Screen 07/04/2019  What Year? 0 points  What month? 0 points  What time? 0 points  Count back from 20 0 points  Months in reverse 0 points  Repeat phrase 4 points  Total Score 4    Immunizations Immunization History  Administered Date(s) Administered  . Fluad Quad(high Dose 65+) 02/26/2019, 01/17/2020  . Influenza, High Dose Seasonal PF 02/03/2017, 01/20/2018  . Influenza-Unspecified 01/28/2016, 01/30/2017  . Moderna Sars-Covid-2 Vaccination 05/15/2019, 06/12/2019, 03/17/2020  . Pneumococcal Conjugate-13 09/10/2013, 02/26/2014  . Pneumococcal Polysaccharide-23 08/23/2010  . Zoster 06/14/2011    TDAP status: Due, Education has been provided regarding the importance of this vaccine. Advised may receive this vaccine at local pharmacy or Health Dept. Aware to provide a copy of the vaccination record if obtained from local pharmacy or Health Dept. Verbalized acceptance and understanding.  Flu Vaccine status: Up to date  Pneumococcal vaccine status: Up to date  Covid-19 vaccine status: Completed vaccines  Qualifies for Shingles Vaccine? Yes   Zostavax completed Yes   Shingrix Completed?: No.    Education has been provided regarding the importance of this vaccine. Patient has been  advised to call insurance company to determine out of pocket expense if they have not yet received this vaccine. Advised may also receive vaccine at local pharmacy or Health Dept. Verbalized acceptance and understanding.  Screening Tests Health Maintenance  Topic Date Due  . OPHTHALMOLOGY EXAM  11/19/2019  . FOOT EXAM  07/03/2020   . TETANUS/TDAP  01/23/2023 (Originally 02/08/1960)  . COVID-19 Vaccine (4 - Booster for Moderna series) 09/14/2020  . HEMOGLOBIN A1C  09/27/2020  . INFLUENZA VACCINE  Completed  . DEXA SCAN  Completed  . PNA vac Low Risk Adult  Completed  . HPV VACCINES  Aged Out    Health Maintenance  Health Maintenance Due  Topic Date Due  . OPHTHALMOLOGY EXAM  11/19/2019  . FOOT EXAM  07/03/2020    Colorectal cancer screening: No longer required.   Mammogram status: No longer required due to age.  Bone Density status: Completed 06/26/16. Results reflect: Previous DEXA scan was normal. No repeat needed unless advised by a physician.  Lung Cancer Screening: (Low Dose CT Chest recommended if Age 84-80 years, 30 pack-year currently smoking OR have quit w/in 15years.) does not qualify.   Additional Screening:  Vision Screening: Recommended annual ophthalmology exams for early detection of glaucoma and other disorders of the eye. Is the patient up to date with their annual eye exam?  Yes  Who is the provider or what is the name of the office in which the patient attends annual eye exams? Dr Matilde Sprang If pt is not established with a provider, would they like to be referred to a provider to establish care? No .   Dental Screening: Recommended annual dental exams for proper oral hygiene  Community Resource Referral / Chronic Care Management: CRR required this visit?  No   CCM required this visit?  No      Plan:     I have personally reviewed and noted the following in the patient's chart:   . Medical and social history . Use of alcohol, tobacco or illicit drugs  . Current medications and supplements . Functional ability and status . Nutritional status . Physical activity . Advanced directives . List of other physicians . Hospitalizations, surgeries, and ER visits in previous 12 months . Vitals . Screenings to include cognitive, depression, and falls . Referrals and appointments  In  addition, I have reviewed and discussed with patient certain preventive protocols, quality metrics, and best practice recommendations. A written personalized care plan for preventive services as well as general preventive health recommendations were provided to patient.     McKenzie Scott, [place]   11/07/9782   Nurse Notes: Pt needs a diabetic foot exam at next in office apt. Pt plans to schedule an eye exam this year with Dr Matilde Sprang.

## 2020-07-08 ENCOUNTER — Other Ambulatory Visit: Payer: Self-pay

## 2020-07-08 ENCOUNTER — Ambulatory Visit (INDEPENDENT_AMBULATORY_CARE_PROVIDER_SITE_OTHER): Payer: Medicare HMO

## 2020-07-08 DIAGNOSIS — Z0000 Encounter for general adult medical examination without abnormal findings: Secondary | ICD-10-CM | POA: Diagnosis not present

## 2020-07-08 NOTE — Patient Instructions (Signed)
Jessica Wheeler , Thank you for taking time to come for your Medicare Wellness Visit. I appreciate your ongoing commitment to your health goals. Please review the following plan we discussed and let me know if I can assist you in the future.   Screening recommendations/referrals: Colonoscopy: No longer required.  Mammogram: No longer required.  Bone Density: Previous DEXA scan was normal. No repeat needed unless advised by a physician. Recommended yearly ophthalmology/optometry visit for glaucoma screening and checkup Recommended yearly dental visit for hygiene and checkup  Vaccinations: Influenza vaccine: Done 01/16/21 Pneumococcal vaccine: Completed series Tdap vaccine: Currently due, declined receiving. Shingles vaccine: Shingrix discussed. Please contact your pharmacy for coverage information.     Advanced directives: Please bring a copy of your POA (Power of Attorney) and/or Living Will to your next appointment.   Conditions/risks identified: Recommend increasing water intake to 6-8 8 oz glasses a day.   Next appointment: 07/21/20 @ 10:40 AM with Dr Beryle Flock    Preventive Care 65 Years and Older, Female Preventive care refers to lifestyle choices and visits with your health care provider that can promote health and wellness. What does preventive care include?  A yearly physical exam. This is also called an annual well check.  Dental exams once or twice a year.  Routine eye exams. Ask your health care provider how often you should have your eyes checked.  Personal lifestyle choices, including:  Daily care of your teeth and gums.  Regular physical activity.  Eating a healthy diet.  Avoiding tobacco and drug use.  Limiting alcohol use.  Practicing safe sex.  Taking low-dose aspirin every day.  Taking vitamin and mineral supplements as recommended by your health care provider. What happens during an annual well check? The services and screenings done by your health care  provider during your annual well check will depend on your age, overall health, lifestyle risk factors, and family history of disease. Counseling  Your health care provider may ask you questions about your:  Alcohol use.  Tobacco use.  Drug use.  Emotional well-being.  Home and relationship well-being.  Sexual activity.  Eating habits.  History of falls.  Memory and ability to understand (cognition).  Work and work Astronomer.  Reproductive health. Screening  You may have the following tests or measurements:  Height, weight, and BMI.  Blood pressure.  Lipid and cholesterol levels. These may be checked every 5 years, or more frequently if you are over 29 years old.  Skin check.  Lung cancer screening. You may have this screening every year starting at age 38 if you have a 30-pack-year history of smoking and currently smoke or have quit within the past 15 years.  Fecal occult blood test (FOBT) of the stool. You may have this test every year starting at age 38.  Flexible sigmoidoscopy or colonoscopy. You may have a sigmoidoscopy every 5 years or a colonoscopy every 10 years starting at age 23.  Hepatitis C blood test.  Hepatitis B blood test.  Sexually transmitted disease (STD) testing.  Diabetes screening. This is done by checking your blood sugar (glucose) after you have not eaten for a while (fasting). You may have this done every 1-3 years.  Bone density scan. This is done to screen for osteoporosis. You may have this done starting at age 4.  Mammogram. This may be done every 1-2 years. Talk to your health care provider about how often you should have regular mammograms. Talk with your health care provider about your  test results, treatment options, and if necessary, the need for more tests. Vaccines  Your health care provider may recommend certain vaccines, such as:  Influenza vaccine. This is recommended every year.  Tetanus, diphtheria, and acellular  pertussis (Tdap, Td) vaccine. You may need a Td booster every 10 years.  Zoster vaccine. You may need this after age 27.  Pneumococcal 13-valent conjugate (PCV13) vaccine. One dose is recommended after age 37.  Pneumococcal polysaccharide (PPSV23) vaccine. One dose is recommended after age 23. Talk to your health care provider about which screenings and vaccines you need and how often you need them. This information is not intended to replace advice given to you by your health care provider. Make sure you discuss any questions you have with your health care provider. Document Released: 05/15/2015 Document Revised: 01/06/2016 Document Reviewed: 02/17/2015 Elsevier Interactive Patient Education  2017 [place] Prevention in the Home Falls can cause injuries. They can happen to people of all ages. There are many things you can do to make your home safe and to help prevent falls. What can I do on the outside of my home?  Regularly fix the edges of walkways and driveways and fix any cracks.  Remove anything that might make you trip as you walk through a door, such as a raised step or threshold.  Trim any bushes or trees on the path to your home.  Use bright outdoor lighting.  Clear any walking paths of anything that might make someone trip, such as rocks or tools.  Regularly check to see if handrails are loose or broken. Make sure that both sides of any steps have handrails.  Any raised decks and porches should have guardrails on the edges.  Have any leaves, snow, or ice cleared regularly.  Use sand or salt on walking paths during winter.  Clean up any spills in your garage right away. This includes oil or grease spills. What can I do in the bathroom?  Use night lights.  Install grab bars by the toilet and in the tub and shower. Do not use towel bars as grab bars.  Use non-skid mats or decals in the tub or shower.  If you need to sit down in the shower, use a plastic,  non-slip stool.  Keep the floor dry. Clean up any water that spills on the floor as soon as it happens.  Remove soap buildup in the tub or shower regularly.  Attach bath mats securely with double-sided non-slip rug tape.  Do not have throw rugs and other things on the floor that can make you trip. What can I do in the bedroom?  Use night lights.  Make sure that you have a light by your bed that is easy to reach.  Do not use any sheets or blankets that are too big for your bed. They should not hang down onto the floor.  Have a firm chair that has side arms. You can use this for support while you get dressed.  Do not have throw rugs and other things on the floor that can make you trip. What can I do in the kitchen?  Clean up any spills right away.  Avoid walking on wet floors.  Keep items that you use a lot in easy-to-reach places.  If you need to reach something above you, use a strong step stool that has a grab bar.  Keep electrical cords out of the way.  Do not use floor polish or wax that  makes floors slippery. If you must use wax, use non-skid floor wax.  Do not have throw rugs and other things on the floor that can make you trip. What can I do with my stairs?  Do not leave any items on the stairs.  Make sure that there are handrails on both sides of the stairs and use them. Fix handrails that are broken or loose. Make sure that handrails are as long as the stairways.  Check any carpeting to make sure that it is firmly attached to the stairs. Fix any carpet that is loose or worn.  Avoid having throw rugs at the top or bottom of the stairs. If you do have throw rugs, attach them to the floor with carpet tape.  Make sure that you have a light switch at the top of the stairs and the bottom of the stairs. If you do not have them, ask someone to add them for you. What else can I do to help prevent falls?  Wear shoes that:  Do not have high heels.  Have rubber  bottoms.  Are comfortable and fit you well.  Are closed at the toe. Do not wear sandals.  If you use a stepladder:  Make sure that it is fully opened. Do not climb a closed stepladder.  Make sure that both sides of the stepladder are locked into place.  Ask someone to hold it for you, if possible.  Clearly mark and make sure that you can see:  Any grab bars or handrails.  First and last steps.  Where the edge of each step is.  Use tools that help you move around (mobility aids) if they are needed. These include:  Canes.  Walkers.  Scooters.  Crutches.  Turn on the lights when you go into a dark area. Replace any light bulbs as soon as they burn out.  Set up your furniture so you have a clear path. Avoid moving your furniture around.  If any of your floors are uneven, fix them.  If there are any pets around you, be aware of where they are.  Review your medicines with your doctor. Some medicines can make you feel dizzy. This can increase your chance of falling. Ask your doctor what other things that you can do to help prevent falls. This information is not intended to replace advice given to you by your health care provider. Make sure you discuss any questions you have with your health care provider. Document Released: 02/12/2009 Document Revised: 09/24/2015 Document Reviewed: 05/23/2014 Elsevier Interactive Patient Education  2017 [employer].

## 2020-07-17 ENCOUNTER — Telehealth: Payer: Self-pay

## 2020-07-20 ENCOUNTER — Other Ambulatory Visit: Payer: Self-pay | Admitting: Family Medicine

## 2020-07-20 NOTE — Telephone Encounter (Signed)
Requested Prescriptions  Pending Prescriptions Disp Refills  . amiloride-hydrochlorothiazide (MODURETIC) 5-50 MG tablet [Pharmacy Med Name: AMILORIDE/HYDROCHLOROTHIAZIDE 5-50 MG Tablet] 45 tablet 1    Sig: TAKE 1/2 TABLET EVERY DAY     Cardiovascular: Diuretic Combos Failed - 07/20/2020  6:00 PM      Failed - Na in normal range and within 360 days    Sodium  Date Value Ref Range Status  03/31/2020 126 (L) 135 - 145 mmol/L Final  03/13/2020 135 134 - 144 mmol/L Final  10/01/2011 129 (L) 136 - 145 mmol/L Final         Failed - Ca in normal range and within 360 days    Calcium  Date Value Ref Range Status  03/31/2020 8.8 (L) 8.9 - 10.3 mg/dL Final   Calcium, Total  Date Value Ref Range Status  10/01/2011 9.2 8.5 - 10.1 mg/dL Final         Failed - Last BP in normal range    BP Readings from Last 1 Encounters:  04/03/20 (!) 164/69         Passed - K in normal range and within 360 days    Potassium  Date Value Ref Range Status  03/31/2020 3.8 3.5 - 5.1 mmol/L Final  10/01/2011 3.3 (L) 3.5 - 5.1 mmol/L Final         Passed - Cr in normal range and within 360 days    Creatinine  Date Value Ref Range Status  10/01/2011 0.72 0.60 - 1.30 mg/dL Final   Creatinine, Ser  Date Value Ref Range Status  03/31/2020 0.81 0.44 - 1.00 mg/dL Final         Passed - Valid encounter within last 6 months    Recent Outpatient Visits          2 months ago Cervicogenic headache   [facility] [place], Marzella Schlein, MD   3 months ago [facility] [place], Eula Fried, FNP   4 months ago Type 2 diabetes mellitus with other specified complication, without long-term current use of insulin [facility])   [facility], Marzella Schlein, MD   6 months ago Bladder spasms   [facility] [place], Marzella Schlein, MD   7 months ago Chest pain, unspecified type   [facility], Marzella Schlein, MD      Future Appointments             Tomorrow [place], Marzella Schlein, MD [facility]

## 2020-07-21 ENCOUNTER — Encounter: Payer: Self-pay | Admitting: Family Medicine

## 2020-07-21 ENCOUNTER — Other Ambulatory Visit: Payer: Self-pay

## 2020-07-21 ENCOUNTER — Ambulatory Visit (INDEPENDENT_AMBULATORY_CARE_PROVIDER_SITE_OTHER): Payer: Medicare HMO | Admitting: Family Medicine

## 2020-07-21 VITALS — BP 146/72 | HR 80 | Temp 98.4°F | Wt 142.0 lb

## 2020-07-21 DIAGNOSIS — I701 Atherosclerosis of renal artery: Secondary | ICD-10-CM

## 2020-07-21 DIAGNOSIS — E871 Hypo-osmolality and hyponatremia: Secondary | ICD-10-CM | POA: Diagnosis not present

## 2020-07-21 DIAGNOSIS — E1169 Type 2 diabetes mellitus with other specified complication: Secondary | ICD-10-CM | POA: Diagnosis not present

## 2020-07-21 DIAGNOSIS — E785 Hyperlipidemia, unspecified: Secondary | ICD-10-CM | POA: Diagnosis not present

## 2020-07-21 DIAGNOSIS — Z0000 Encounter for general adult medical examination without abnormal findings: Secondary | ICD-10-CM | POA: Diagnosis not present

## 2020-07-21 DIAGNOSIS — I152 Hypertension secondary to endocrine disorders: Secondary | ICD-10-CM | POA: Diagnosis not present

## 2020-07-21 DIAGNOSIS — D649 Anemia, unspecified: Secondary | ICD-10-CM | POA: Diagnosis not present

## 2020-07-21 DIAGNOSIS — I7102 Dissection of abdominal aorta: Secondary | ICD-10-CM | POA: Diagnosis not present

## 2020-07-21 DIAGNOSIS — I25118 Atherosclerotic heart disease of native coronary artery with other forms of angina pectoris: Secondary | ICD-10-CM | POA: Diagnosis not present

## 2020-07-21 DIAGNOSIS — E1159 Type 2 diabetes mellitus with other circulatory complications: Secondary | ICD-10-CM

## 2020-07-21 DIAGNOSIS — E663 Overweight: Secondary | ICD-10-CM

## 2020-07-21 NOTE — Assessment & Plan Note (Signed)
Currently asymptomatic.  Followed by cardiology.   

## 2020-07-21 NOTE — Assessment & Plan Note (Signed)
Longstanding and mild Worse during recent hospitalization after neck surgery Related to diuretic use Patient hesitant to DC given history of lower extremity edema without it Recheck today and if remains low, will consider cutting back on diuretic use at least

## 2020-07-21 NOTE — Progress Notes (Signed)
Complete physical exam   Patient: Jessica Wheeler   DOB: April 01, 1941   80 y.o. Female  MRN: [id number] Visit Date: 07/21/2020  Today's healthcare provider: Lavon Paganini, MD   Chief Complaint  Patient presents with  . Annual Exam   Subjective    [name] is a 80 y.o. female who presents today for a complete physical exam.  She reports consuming a general diet.  She generally feels fairly well. She reports sleeping poorly since her neck surgery.   She does have additional problems to discuss today.   HPI  Had two neck surgeries 03/2020 and 04/2020. Feels like 90% is her being inpatient with the healing, but she is frustrated by the lack of instructions after surgery from her surgeon.  Sees him again on 4/1 for next steps.  Upcoming eye exam next week.  Was getting fatigue and myalgias from zetia  Past Medical History:  Diagnosis Date  . Allergy   . Arthritis   . Bundle branch block, right   . C7 cervical fracture ([place])   . Diabetes mellitus   . Diffuse cystic mastopathy   . 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 Diverticulosis   . Family history of malignant neoplasm of gastrointestinal tract   . Glaucoma 2005  . Heart murmur   . History of bronchitis   . History of pancreatitis 2005  . Hyperlipidemia   . Hypertension 1982  . Personal history of tobacco use, presenting hazards to health   . RBBB   . Ulcer    Past Surgical History:  Procedure Laterality Date  . ABDOMINAL HYSTERECTOMY  1991  . ANTERIOR CERVICAL CORPECTOMY N/A 03/30/2020   Procedure: CORPECTOMY CERVICAL FOUR - CERVICAL FIVE;  Surgeon: Newman Pies, MD;  Location: [facility];  Service: Neurosurgery;  Laterality: N/A;  anterior  . APPENDECTOMY    . [place]   for lumbar ruptured disc. No hardware, no discectomy, no fusion  . BLADDER SURGERY    . BREAST BIOPSY Right 1980's   benign  . BREAST EXCISIONAL BIOPSY Right 1980s   benign  . BREAST SURGERY     biopsy  .  CARDIAC CATHETERIZATION     Dr. Ubaldo Glassing, few years ago  . CATARACT EXTRACTION W/PHACO Left 08/31/2015   Procedure: CATARACT EXTRACTION PHACO AND INTRAOCULAR LENS PLACEMENT (IOC);  Surgeon: Ronnell Freshwater, MD;  Location: [facility];  Service: Ophthalmology;  Laterality: Left;  DIABETIC - oral meds  . CATARACT EXTRACTION W/PHACO Right 10/12/2015   Procedure: CATARACT EXTRACTION PHACO AND INTRAOCULAR LENS PLACEMENT ([place]) right eye;  Surgeon: Ronnell Freshwater, MD;  Location: [facility];  Service: Ophthalmology;  Laterality: Right;  DIABETIC - oral meds  . CHOLECYSTECTOMY  1994  . COLONOSCOPY  2008,02/22/2012   Dr. Sankar-2013  . COLONOSCOPY WITH PROPOFOL N/A 04/04/2017   Procedure: COLONOSCOPY WITH PROPOFOL;  Surgeon: Christene Lye, MD;  Location: [facility];  Service: Endoscopy;  Laterality: N/A;  . POSTERIOR CERVICAL FUSION/FORAMINOTOMY N/A 04/01/2020   Procedure: POSTERIOR CERVICAL FUSION/FORAMINOTOMY CERVICAL FOUR- CERVICAL SIX;  Surgeon: Newman Pies, MD;  Location: [facility];  Service: Neurosurgery;  Laterality: N/A;  . REFRACTIVE SURGERY     Social History   Socioeconomic History  . Marital status: Widowed    Spouse name: Not on file  . Number of children: 2  . Years of education: 29  . Highest education level: Associate degree: academic program  Occupational History  .  Occupation: Retired  Tobacco Use  . Smoking status: Former Smoker    Packs/day: 1.00    Years: 15.00    Pack years: 15.00    Types: Cigarettes    Quit date: 08/03/2003    Years since quitting: 16.9  . Smokeless tobacco: Never Used  Vaping Use  . Vaping Use: Never used  Substance and Sexual Activity  . Alcohol use: No  . Drug use: No  . Sexual activity: Not on file  Other Topics Concern  . Not on file  Social History Narrative  . Not on file   Social Determinants of Health   Financial Resource Strain: Low Risk   . Difficulty of Paying Living Expenses: Not very  hard  Food Insecurity: No Food Insecurity  . Worried About Charity fundraiser in the Last Year: Never true  . Ran Out of Food in the Last Year: Never true  Transportation Needs: No Transportation Needs  . Lack of Transportation (Medical): No  . Lack of Transportation (Non-Medical): No  Physical Activity: Insufficiently Active  . Days of Exercise per Week: 4 days  . Minutes of Exercise per Session: 30 min  Stress: Stress Concern Present  . Feeling of Stress : To some extent  Social Connections: Socially Isolated  . Frequency of Communication with Friends and Family: More than three times a week  . Frequency of Social Gatherings with Friends and Family: Once a week  . Attends Religious Services: Never  . Active Member of Clubs or Organizations: No  . Attends Archivist Meetings: Never  . Marital Status: Widowed  Intimate Partner Violence: Not At Risk  . Fear of Current or Ex-Partner: No  . Emotionally Abused: No  . Physically Abused: No  . Sexually Abused: No   Family Status  Relation Name Status  . Mother  Deceased  . Father  Deceased at age 47  . Brother  Deceased at age 55  . MGM  Deceased  . MGF  Deceased  . Mat Aunt  Deceased  . Cousin  (Not Specified)  . Daughter  Alive  . Son  Alive   Family History  Problem Relation Age of Onset  . Diabetes Mother   . Hypertension Mother   . Anxiety disorder Mother   . Heart disease Father        rheumatic heart disease  . Heart attack Father   . Rheum arthritis Father   . Colon cancer Brother   . Hypertension Maternal Grandmother   . Hypertension Maternal Grandfather   . Breast cancer Maternal Aunt 60  . Breast cancer Cousin 50   Allergies  Allergen Reactions  . Beta Adrenergic Blockers     Chest pressure/difficulty breathing  . Jardiance [Empagliflozin]     Muscle cramps  . Levaquin [Levofloxacin In D5w]     Low blood sugar; does take this when she needs it. She just makes sure to control her BS to avoid  hypoglycemia.  . Lisinopril Cough  . Zetia [Ezetimibe]     Lethargic   . Prevnar [Pneumococcal 13-Val Conj Vacc] Rash    Joint Pain  . Sulfa Antibiotics Rash    "makes my skin raw"    Patient Care Team: Virginia Crews, MD as PCP - General (Family Medicine) Rockey Situ, Kathlene November, MD as Consulting Physician (Cardiology) Felipa Furnace, DPM as Consulting Physician (Podiatry) Nice, Reed Breech, OD (Optometry) Neldon Labella, RN as Case Manager Susa Day, MD as Consulting Physician (Orthopedic Surgery)  Newman Pies, MD as Consulting Physician (Neurosurgery) Clyde Canterbury, MD as Referring Physician (Otolaryngology)   Medications: Outpatient Medications Prior to Visit  Medication Sig  . Accu-Chek FastClix Lancets MISC 1 strip by Does not apply route daily.  Marland Kitchen amiloride-hydrochlorothiazide (MODURETIC) 5-50 MG tablet TAKE 1/2 TABLET EVERY DAY  . amLODipine (NORVASC) 5 MG tablet Take 1 tablet (5 mg total) by mouth daily.  Marland Kitchen aspirin 81 MG EC tablet Take 81 mg by mouth daily.  . Blood Glucose Monitoring Suppl (ACCU-CHEK GUIDE) w/Device KIT 1 each by Does not apply route as directed.  . Cyanocobalamin (VITAMIN B-12) 1000 MCG/15ML LIQD Take 1,000 mcg by mouth daily.   . cyclobenzaprine (FLEXERIL) 5 MG tablet Take 1 tablet (5 mg total) by mouth 3 (three) times daily as needed for muscle spasms.  Marland Kitchen glucose blood (ACCU-CHEK GUIDE) test strip Use as instructed  . ibuprofen (ADVIL) 200 MG tablet Take 200 mg by mouth daily at 6 (six) AM.  . Lancets Misc. (ACCU-CHEK FASTCLIX LANCET) KIT 1 each by Does not apply route as directed.  . latanoprost (XALATAN) 0.005 % ophthalmic solution Place 1 drop into both eyes at bedtime.   Marland Kitchen losartan (COZAAR) 100 MG tablet Take 1 tablet (100 mg total) by mouth daily.  . Magnesium 250 MG TABS Take 250 mg by mouth daily.  . metFORMIN (GLUCOPHAGE) 500 MG tablet TAKE 2 TABLETS BY MOUTH TWICE DAILY WITH A MEAL  . [DISCONTINUED] blood glucose meter kit and supplies  Dispense based on patient and insurance preference. Use to check fasting blood sugar daily. (FOR ICD-10 E10.9, E11.9). (Patient not taking: Reported on 07/08/2020)  . [DISCONTINUED] ezetimibe (ZETIA) 10 MG tablet Take 1 tablet (10 mg total) by mouth daily. (Patient not taking: No sig reported)   No facility-administered medications prior to visit.    Review of Systems  Constitutional: Positive for activity change and appetite change. Negative for chills, diaphoresis, fatigue, fever and unexpected weight change.  HENT: Positive for ear discharge, sinus pressure and sneezing. Negative for congestion, dental problem, drooling, ear pain, facial swelling, hearing loss, mouth sores, nosebleeds, postnasal drip, rhinorrhea, sinus pain, sore throat, tinnitus, trouble swallowing and voice change.   Eyes: Positive for discharge. Negative for photophobia, pain, redness, itching and visual disturbance.  Respiratory: Negative.   Cardiovascular: Negative.   Gastrointestinal: Negative.   Endocrine: Negative.   Genitourinary: Negative.   Musculoskeletal: Positive for arthralgias, back pain, neck pain and neck stiffness. Negative for gait problem, joint swelling and myalgias.  Skin: Negative.   Allergic/Immunologic: Negative.   Neurological: Positive for light-headedness and numbness. Negative for dizziness, tremors, seizures, syncope, facial asymmetry, speech difficulty, weakness and headaches.  Hematological: Negative.   Psychiatric/Behavioral: Positive for sleep disturbance. Negative for agitation, behavioral problems, confusion, decreased concentration, dysphoric mood, hallucinations, self-injury and suicidal ideas. The patient is not nervous/anxious and is not hyperactive.       Objective    BP (!) 146/72 (BP Location: Left Arm, Patient Position: Sitting, Cuff Size: Large)   Pulse 80   Temp 98.4 F (36.9 C) (Oral)   Wt 142 lb (64.4 kg)   BMI 25.16 kg/m    Physical Exam Vitals reviewed.   Constitutional:      General: She is not in acute distress.    Appearance: Normal appearance. She is well-developed. She is not diaphoretic.  HENT:     Head: Normocephalic and atraumatic.  Eyes:     General: No scleral icterus.    Conjunctiva/sclera: Conjunctivae normal.  Neck:  Thyroid: No thyromegaly.  Cardiovascular:     Rate and Rhythm: Normal rate and regular rhythm.     Pulses: Normal pulses.     Heart sounds: Normal heart sounds. No murmur heard.   Pulmonary:     Effort: Pulmonary effort is normal. No respiratory distress.     Breath sounds: Normal breath sounds. No wheezing, rhonchi or rales.  Musculoskeletal:     Cervical back: Neck supple.     Right lower leg: No edema.     Left lower leg: No edema.  Lymphadenopathy:     Cervical: No cervical adenopathy.  Skin:    General: Skin is warm and dry.     Findings: No rash.  Neurological:     Mental Status: She is alert and oriented to person, place, and time. Mental status is at baseline.  Psychiatric:        Mood and Affect: Mood normal.        Behavior: Behavior normal.      Diabetic Foot Exam - Simple   Simple Foot Form Diabetic Foot exam was performed with the following findings: Yes 07/21/2020 11:16 AM  Visual Inspection No deformities, no ulcerations, no other skin breakdown bilaterally: Yes Sensation Testing Intact to touch and monofilament testing bilaterally: Yes Pulse Check Posterior Tibialis and Dorsalis pulse intact bilaterally: Yes Comments      Last depression screening scores PHQ 2/9 Scores 07/08/2020 03/13/2020 01/17/2020  PHQ - 2 Score 0 0 2  PHQ- 9 Score - 3 6   Last fall risk screening Fall Risk  07/08/2020  Falls in the past year? 0  Number falls in past yr: 0  Injury with Fall? 0  Comment -  Risk for fall due to : -  Risk for fall due to: Comment -  Follow up -   Last Audit-C alcohol use screening Alcohol Use Disorder Test (AUDIT) 07/08/2020  1. How often do you have a drink  containing alcohol? 0  2. How many drinks containing alcohol do you have on a typical day when you are drinking? 0  3. How often do you have six or more drinks on one occasion? 0  AUDIT-C Score 0  Alcohol Brief Interventions/Follow-up AUDIT Score <7 follow-up not indicated   A score of 3 or more in women, and 4 or more in men indicates increased risk for alcohol abuse, EXCEPT if all of the points are from question 1   No results found for any visits on 07/21/20.  Assessment & Plan    Routine Health Maintenance and Physical Exam  Exercise Activities and Dietary recommendations Goals    . Chronic Disease Management     CARE PLAN ENTRY (see longitudinal plan of care for additional care plan information)  Current Barriers:  . Chronic Disease Management support and education needs related to HTN, CAD, DM and Hyperlipidemia. (Recent C-Spine Injury)  Case Manager Clinical Goal(s):  Marland Kitchen Over the next 120 days, patient will not require hospitalization or emergent care d/t complications r/t chronic illnesses. . Over the next 90 days, patient will take all medications as prescribed. . Over the next 90 days, patient will attend all appointments as scheduled. . Over the next 90 days, patient will monitor blood pressure and record readings. . Over the next 90 days, patient will adhere to recommended cardiac prudent/diabetic diet. . Over the next 90 days, patient will follow recommended safety measures to prevent falls and injuries. . Over the next 60 days, patient will adhere to  treatment plan outlined by the Orthopedic surgical team. . Over the next 30 days, patient will update care management team if in-home assistance is needed.   Interventions:  . Inter-disciplinary care team collaboration (see longitudinal plan of care) . Reviewed medications, home BP and blood glucose monitoring. Continues to take medications as prescribed and monitoring BP and FBS as recommended. Reports FBS readings in the  120's. Denies s/sx r/t hypoglycemia or hyperglycemia. Noted some slightly elevated systolic BP readings in the 140's but reports most readings have been within range. No chest pain, palpitations, dizziness or headaches.   . Discussed current activity tolerance. Reviewed safety and fall prevention measures. Reports being very active and following the recommended activity restrictions. Reports regularly walking 2 to 2 1/5 miles without difficulty. Denies falls or changes in activity tolerance.   . Discussed pending procedures with the Neurosurgery team and plans for care management follow up. Pending Neurosurgical procedures on 03/30/20 and 04/01/20. We discussed possible need for in-home assistance after discharge. She prefers to receive Home Health services versus  transitioning to an rehabilitation facility. She plans to discuss this with the Neurosurgery team. She reports having very good support. Anticipates family and neighbors being available to assist if needed. She does not anticipate a need for additional in-home assistance but agreed to update the care management team if this changes.   Patient Self Care Activities:  . Self administers medications  . Attends scheduled provider appointments . Calls pharmacy for medication refills . Performs ADL's independently   Please see past updates related to this goal by clicking on the "Past Updates" button in the selected goal      . DIET - INCREASE WATER INTAKE     Recommend increasing water intake to 6-8 8 oz glasses a day.        Immunization History  Administered Date(s) Administered  . Fluad Quad(high Dose 65+) 02/26/2019, 01/17/2020  . Influenza, High Dose Seasonal PF 02/03/2017, 01/20/2018  . Influenza-Unspecified 01/28/2016, 01/30/2017  . Moderna Sars-Covid-2 Vaccination 05/15/2019, 06/12/2019, 03/17/2020  . Pneumococcal Conjugate-13 09/10/2013, 02/26/2014  . Pneumococcal Polysaccharide-23 08/23/2010  . Zoster 06/14/2011     Health Maintenance  Topic Date Due  . OPHTHALMOLOGY EXAM  11/19/2019  . TETANUS/TDAP  01/23/2023 (Originally 02/08/1960)  . COVID-19 Vaccine (4 - Booster for Moderna series) 09/14/2020  . HEMOGLOBIN A1C  09/27/2020  . FOOT EXAM  07/21/2021  . INFLUENZA VACCINE  Completed  . DEXA SCAN  Completed  . PNA vac Low Risk Adult  Completed  . HPV VACCINES  Aged Out    Discussed health benefits of physical activity, and encouraged her to engage in regular exercise appropriate for her age and condition.  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[place])    Used to see Dr Lucky Cowboy, but now followed by Cardiology Seemed to have healed in 2019 when last imaged by Dr Lucky Cowboy        Endocrine   DM (diabetes mellitus) ([place])    Previously well  controlled Recheck A1c Continue current medications Upcoming eye exam Foot exam completed today Up-to-date on vaccinations On ACE inhibitor Not on statin as below Discussed diet and exercise Follow-up in 6 months      Relevant Orders   Hemoglobin A1c   Hyperlipidemia associated with type 2 diabetes mellitus ([place])    Did not tolerate zetia and hesitance to take statins due to possible myalgias Reviewed last lipid panel Also sees cardiology        Other   Chronic hyponatremia    Longstanding and mild Worse during recent hospitalization after neck surgery Related to diuretic use Patient hesitant to DC given history of lower extremity edema without it Recheck today and if remains low, will consider cutting back on  diuretic use at least      Relevant Orders   Comprehensive metabolic panel   Overweight    Discussed importance of healthy weight management Discussed diet and exercise  Continue walking daily       Other Visit Diagnoses    Encounter for annual physical exam    -  Primary   Normocytic anemia       Relevant Orders   CBC w/Diff/Platelet       Return in about 6 months (around 01/21/2021) for chronic disease f/u.     I, Lavon Paganini, MD, have reviewed all documentation for this visit. The documentation on 07/21/20 for the exam, diagnosis, procedures, and orders are all accurate and complete.   Renelle Stegenga, [name], MD, MPH [facility]

## 2020-07-21 NOTE — Assessment & Plan Note (Signed)
Continue to follow with cardiology.

## 2020-07-21 NOTE — Assessment & Plan Note (Signed)
Discussed importance of healthy weight management Discussed diet and exercise  Continue walking daily

## 2020-07-21 NOTE — Assessment & Plan Note (Signed)
Previously well controlled Recheck A1c Continue current medications Upcoming eye exam Foot exam completed today Up-to-date on vaccinations On ACE inhibitor Not on statin as below Discussed diet and exercise Follow-up in 6 months

## 2020-07-21 NOTE — Assessment & Plan Note (Signed)
Used to see Dr Wyn Quaker, but now followed by Cardiology Seemed to have healed in 2019 when last imaged by Dr Wyn Quaker

## 2020-07-21 NOTE — Assessment & Plan Note (Signed)
Did not tolerate zetia and hesitance to take statins due to possible myalgias Reviewed last lipid panel Also sees cardiology

## 2020-07-21 NOTE — Assessment & Plan Note (Signed)
Slightly elevated today Monitor home BPs Continue current meds Recheck metabolic panel F/u in 6 months

## 2020-07-21 NOTE — Patient Instructions (Signed)
Preventive Care 82 Years and Older, Female Preventive care refers to lifestyle choices and visits with your health care provider that can promote health and wellness. This includes:  A yearly physical exam. This is also called an annual wellness visit.  Regular dental and eye exams.  Immunizations.  Screening for certain conditions.  Healthy lifestyle choices, such as: ? Eating a healthy diet. ? Getting regular exercise. ? Not using drugs or products that contain nicotine and tobacco. ? Limiting alcohol use. What can I expect for my preventive care visit? Physical exam Your health care provider will check your:  Height and weight. These may be used to calculate your BMI (body mass index). BMI is a measurement that tells if you are at a healthy weight.  Heart rate and blood pressure.  Body temperature.  Skin for abnormal spots. Counseling Your health care provider may ask you questions about your:  Past medical problems.  Family's medical history.  Alcohol, tobacco, and drug use.  Emotional well-being.  Home life and relationship well-being.  Sexual activity.  Diet, exercise, and sleep habits.  History of falls.  Memory and ability to understand (cognition).  Work and work Statistician.  Pregnancy and menstrual history.  Access to firearms. What immunizations do I need? Vaccines are usually given at various ages, according to a schedule. Your health care provider will recommend vaccines for you based on your age, medical history, and lifestyle or other factors, such as travel or where you work.   What tests do I need? Blood tests  Lipid and cholesterol levels. These may be checked every 5 years, or more often depending on your overall health.  Hepatitis C test.  Hepatitis B test. Screening  Lung cancer screening. You may have this screening every year starting at age 74 if you have a 30-pack-year history of smoking and currently smoke or have quit within  the past 15 years.  Colorectal cancer screening. ? All adults should have this screening starting at age 44 and continuing until age 58. ? Your health care provider may recommend screening at age 2 if you are at increased risk. ? You will have tests every 1-10 years, depending on your results and the type of screening test.  Diabetes screening. ? This is done by checking your blood sugar (glucose) after you have not eaten for a while (fasting). ? You may have this done every 1-3 years.  Mammogram. ? This may be done every 1-2 years. ? Talk with your health care provider about how often you should have regular mammograms.  Abdominal aortic aneurysm (AAA) screening. You may need this if you are a current or former smoker.  BRCA-related cancer screening. This may be done if you have a family history of breast, ovarian, tubal, or peritoneal cancers. Other tests  STD (sexually transmitted disease) testing, if you are at risk.  Bone density scan. This is done to screen for osteoporosis. You may have this done starting at age 104. Talk with your health care provider about your test results, treatment options, and if necessary, the need for more tests. Follow these instructions at home: Eating and drinking  Eat a diet that includes fresh fruits and vegetables, whole grains, lean protein, and low-fat dairy products. Limit your intake of foods with high amounts of sugar, saturated fats, and salt.  Take vitamin and mineral supplements as recommended by your health care provider.  Do not drink alcohol if your health care provider tells you not to drink.  If you drink alcohol: ? Limit how much you have to 0-1 drink a day. ? Be aware of how much alcohol is in your drink. In the [place] one drink equals one 12 oz bottle of beer (355 mL), one 5 oz glass of wine (148 mL), or one 1 oz glass of hard liquor (44 mL).   Lifestyle  Take daily care of your teeth and gums. Brush your teeth every morning  and night with fluoride toothpaste. Floss one time each day.  Stay active. Exercise for at least 30 minutes 5 or more days each week.  Do not use any products that contain nicotine or tobacco, such as cigarettes, e-cigarettes, and chewing tobacco. If you need help quitting, ask your health care provider.  Do not use drugs.  If you are sexually active, practice safe sex. Use a condom or other form of protection in order to prevent STIs (sexually transmitted infections).  Talk with your health care provider about taking a low-dose aspirin or statin.  Find healthy ways to cope with stress, such as: ? Meditation, yoga, or listening to music. ? Journaling. ? Talking to a trusted person. ? Spending time with friends and family. Safety  Always wear your seat belt while driving or riding in a vehicle.  Do not drive: ? If you have been drinking alcohol. Do not ride with someone who has been drinking. ? When you are tired or distracted. ? While texting.  Wear a helmet and other protective equipment during sports activities.  If you have firearms in your house, make sure you follow all gun safety procedures. What's next?  Visit your health care provider once a year for an annual wellness visit.  Ask your health care provider how often you should have your eyes and teeth checked.  Stay up to date on all vaccines. This information is not intended to replace advice given to you by your health care provider. Make sure you discuss any questions you have with your health care provider. Document Revised: 04/08/2020 Document Reviewed: 04/12/2018 Elsevier Patient Education  2021 [employer] Inc.  

## 2020-07-23 DIAGNOSIS — E1159 Type 2 diabetes mellitus with other circulatory complications: Secondary | ICD-10-CM | POA: Diagnosis not present

## 2020-07-23 DIAGNOSIS — E871 Hypo-osmolality and hyponatremia: Secondary | ICD-10-CM | POA: Diagnosis not present

## 2020-07-23 DIAGNOSIS — D649 Anemia, unspecified: Secondary | ICD-10-CM | POA: Diagnosis not present

## 2020-07-23 DIAGNOSIS — E1169 Type 2 diabetes mellitus with other specified complication: Secondary | ICD-10-CM | POA: Diagnosis not present

## 2020-07-23 DIAGNOSIS — I152 Hypertension secondary to endocrine disorders: Secondary | ICD-10-CM | POA: Diagnosis not present

## 2020-07-24 LAB — COMPREHENSIVE METABOLIC PANEL
ALT: 25 IU/L (ref 0–32)
AST: 23 IU/L (ref 0–40)
Albumin/Globulin Ratio: 1.9 (ref 1.2–2.2)
Albumin: 4.8 g/dL — ABNORMAL HIGH (ref 3.6–4.6)
Alkaline Phosphatase: 64 IU/L (ref 44–121)
BUN/Creatinine Ratio: 19 (ref 12–28)
BUN: 13 mg/dL (ref 8–27)
Bilirubin Total: 0.3 mg/dL (ref 0.0–1.2)
CO2: 20 mmol/L (ref 20–29)
Calcium: 9.7 mg/dL (ref 8.7–10.3)
Chloride: 95 mmol/L — ABNORMAL LOW (ref 96–106)
Creatinine, Ser: 0.68 mg/dL (ref 0.57–1.00)
Globulin, Total: 2.5 g/dL (ref 1.5–4.5)
Glucose: 150 mg/dL — ABNORMAL HIGH (ref 65–99)
Potassium: 4.5 mmol/L (ref 3.5–5.2)
Sodium: 132 mmol/L — ABNORMAL LOW (ref 134–144)
Total Protein: 7.3 g/dL (ref 6.0–8.5)
eGFR: 87 mL/min/{1.73_m2} (ref >59)

## 2020-07-24 LAB — CBC WITH DIFFERENTIAL/PLATELET
Basophils Absolute: 0.1 10*3/uL (ref 0.0–0.2)
Basos: 1 %
EOS (ABSOLUTE): 0.2 10*3/uL (ref 0.0–0.4)
Eos: 3 %
Hematocrit: 38.1 % (ref 34.0–46.6)
Hemoglobin: 12.7 g/dL (ref 11.1–15.9)
Immature Grans (Abs): 0 10*3/uL (ref 0.0–0.1)
Immature Granulocytes: 0 %
Lymphocytes Absolute: 2.2 10*3/uL (ref 0.7–3.1)
Lymphs: 36 %
MCH: 30.2 pg (ref 26.6–33.0)
MCHC: 33.3 g/dL (ref 31.5–35.7)
MCV: 91 fL (ref 79–97)
Monocytes Absolute: 0.7 10*3/uL (ref 0.1–0.9)
Monocytes: 11 %
Neutrophils Absolute: 3 10*3/uL (ref 1.4–7.0)
Neutrophils: 49 %
Platelets: 279 10*3/uL (ref 150–450)
RBC: 4.21 x10E6/uL (ref 3.77–5.28)
RDW: 13.6 % (ref 11.7–15.4)
WBC: 6.1 10*3/uL (ref 3.4–10.8)

## 2020-07-24 LAB — HEMOGLOBIN A1C
Est. average glucose Bld gHb Est-mCnc: 166 mg/dL
Hgb A1c MFr Bld: 7.4 % — ABNORMAL HIGH (ref 4.8–5.6)

## 2020-07-28 DIAGNOSIS — E119 Type 2 diabetes mellitus without complications: Secondary | ICD-10-CM | POA: Diagnosis not present

## 2020-07-28 DIAGNOSIS — H401131 Primary open-angle glaucoma, bilateral, mild stage: Secondary | ICD-10-CM | POA: Diagnosis not present

## 2020-07-28 DIAGNOSIS — Z961 Presence of intraocular lens: Secondary | ICD-10-CM | POA: Diagnosis not present

## 2020-07-28 DIAGNOSIS — Z7984 Long term (current) use of oral hypoglycemic drugs: Secondary | ICD-10-CM | POA: Diagnosis not present

## 2020-07-28 DIAGNOSIS — H43813 Vitreous degeneration, bilateral: Secondary | ICD-10-CM | POA: Diagnosis not present

## 2020-07-30 ENCOUNTER — Ambulatory Visit: Payer: Self-pay

## 2020-07-30 DIAGNOSIS — E1169 Type 2 diabetes mellitus with other specified complication: Secondary | ICD-10-CM

## 2020-07-30 DIAGNOSIS — I10 Essential (primary) hypertension: Secondary | ICD-10-CM

## 2020-07-31 DIAGNOSIS — M488X2 Other specified spondylopathies, cervical region: Secondary | ICD-10-CM | POA: Diagnosis not present

## 2020-08-07 NOTE — Chronic Care Management (AMB) (Signed)
  Chronic Care Management   Note   Name: Jessica Wheeler MRN: [id number] DOB: [birth year]   Jessica Wheeler is currently enrolled in the care management program. She has progressed well with her care management goals but unable to complete an updated outreach today. Will plan to update goals within the next few weeks.   Follow up plan: Will follow up within the next few weeks when Jessica Wheeler is available.  [facility] Care Management [facility] [phone]

## 2020-08-19 DIAGNOSIS — J301 Allergic rhinitis due to pollen: Secondary | ICD-10-CM | POA: Diagnosis not present

## 2020-08-19 DIAGNOSIS — H6983 Other specified disorders of Eustachian tube, bilateral: Secondary | ICD-10-CM | POA: Diagnosis not present

## 2020-08-19 DIAGNOSIS — H6063 Unspecified chronic otitis externa, bilateral: Secondary | ICD-10-CM | POA: Diagnosis not present

## 2020-08-20 DIAGNOSIS — M542 Cervicalgia: Secondary | ICD-10-CM | POA: Diagnosis not present

## 2020-08-31 ENCOUNTER — Ambulatory Visit (INDEPENDENT_AMBULATORY_CARE_PROVIDER_SITE_OTHER): Payer: Medicare HMO

## 2020-08-31 DIAGNOSIS — E1169 Type 2 diabetes mellitus with other specified complication: Secondary | ICD-10-CM | POA: Diagnosis not present

## 2020-08-31 DIAGNOSIS — E785 Hyperlipidemia, unspecified: Secondary | ICD-10-CM

## 2020-08-31 DIAGNOSIS — I10 Essential (primary) hypertension: Secondary | ICD-10-CM

## 2020-08-31 NOTE — Chronic Care Management (AMB) (Signed)
Chronic Care Management   Follow Up Note   08/31/2020 Name: Jessica Wheeler MRN: [id number] DOB: Mar 12, 1941  Primary Care Provider: Virginia Crews, MD Reason for referral : Chronic Care Management   Jessica Wheeler is a 80 y.o. year old female who is a primary care patient of [name], [name], MD. She is currently enrolled in the chronic care management program. A telephonic outreach was conducted today.  Review of Jessica Wheeler's status, including review of consultants reports, relevant labs and test results was conducted today. Collaboration with appropriate care team members was performed as part of the comprehensive evaluation and provision of chronic care management services.    SDOH (Social Determinants of Health) assessments performed: No     Outpatient Encounter Medications as of 08/31/2020  Medication Sig  . Accu-Chek FastClix Lancets MISC 1 strip by Does not apply route daily.  Marland Kitchen amiloride-hydrochlorothiazide (MODURETIC) 5-50 MG tablet TAKE 1/2 TABLET EVERY DAY  . amLODipine (NORVASC) 5 MG tablet Take 1 tablet (5 mg total) by mouth daily.  Marland Kitchen aspirin 81 MG EC tablet Take 81 mg by mouth daily.  . Blood Glucose Monitoring Suppl (ACCU-CHEK GUIDE) w/Device KIT 1 each by Does not apply route as directed.  . Cyanocobalamin (VITAMIN B-12) 1000 MCG/15ML LIQD Take 1,000 mcg by mouth daily.   . cyclobenzaprine (FLEXERIL) 5 MG tablet Take 1 tablet (5 mg total) by mouth 3 (three) times daily as needed for muscle spasms.  Marland Kitchen glucose blood (ACCU-CHEK GUIDE) test strip Use as instructed  . ibuprofen (ADVIL) 200 MG tablet Take 200 mg by mouth daily at 6 (six) AM.  . Lancets Misc. (ACCU-CHEK FASTCLIX LANCET) KIT 1 each by Does not apply route as directed.  . latanoprost (XALATAN) 0.005 % ophthalmic solution Place 1 drop into both eyes at bedtime.   Marland Kitchen losartan (COZAAR) 100 MG tablet Take 1 tablet (100 mg total) by mouth daily.  . Magnesium 250 MG TABS Take 250 mg by mouth daily.  .  metFORMIN (GLUCOPHAGE) 500 MG tablet TAKE 2 TABLETS BY MOUTH TWICE DAILY WITH A MEAL   No facility-administered encounter medications on file as of 08/31/2020.     Objective:  Patient Care Plan: Diabetes Type 2 (Adult)    Problem Identified: Disease Progression (Diabetes, Type 2)     Long-Range Goal: Disease Progression Prevented or Minimized   Start Date: 08/31/2020  Expected End Date: 12/29/2020  Priority: High  Note:   Objective:   . Lab Results .  Component . Value . Date .   Marland Kitchen HGBA1C . 7.4 (H) . 07/23/2020    Current Barriers:  . Chronic disease management and support related to Diabetes self-management  Case Manager Clinical Goal(s):  Marland Kitchen Over the next 120 days, patient will demonstrate continued adherence to prescribed treatment plan for Diabetes self management as evidenced by daily monitoring and recording of CBG, adherence to ADA/ carb modified diet and adherence to prescribed medication regimen.  Interventions:  . Collaboration with Virginia Crews, MD regarding development and update of comprehensive plan of care as evidenced by provider attestation and co-signature . Inter-disciplinary care team collaboration (see longitudinal plan of care) . Reviewed medications and discussed blood glucose readings. Reviewed s/sx of hypoglycemia and hyperglycemia along with recommended interventions. Reports taking medications as prescribed. Notes blood glucose readings have ranged in the 120's and 130's.  Discussed her recent A1C increase from 6.8 to 7.4%. Reports attempting to comply with the recommended diabetic diet. Declined need for additional educational or  nutritional resources. . Discussed recommended DM preventive care. She continues to perform foot care and completes exams as recommended.    Patient Goals/Self-Care Activities -Continue administering medications as prescribed -Attend all scheduled provider appointments -Monitor blood glucose levels consistently and utilize  recommended interventions -Continue adherence to prescribed ADA/carb modified -Notify provider or care management team with questions and new concerns as needed.   Follow Up Plan:  Will follow up next month       Patient Care Plan: Hypertension (Adult)    Problem Identified: Hypertension (Hypertension)     Long-Range Goal: Hypertension Monitored   Start Date: 08/31/2020  Expected End Date: 12/29/2020  Priority: High  Note:   Objective:  . Last practice recorded BP readings:   BP Readings from Last 3 Encounters:  07/21/20 (!) 146/72  04/03/20 (!) 164/69  03/24/20 (!) 158/69    Current Barriers:  . Chronic Disease Management needs and support related Hypertension management.  Case Manager Clinical Goal(s):  Marland Kitchen Over the next 120 days, patient will demonstrate adherence to prescribed treatment plan for hypertension as evidenced by taking all medications as prescribed, monitoring and recording blood pressure, and adhering to a cardiac prudent/heart healthy diet.  Interventions:  . Collaboration with Virginia Crews, MD regarding development and update of comprehensive plan of care as evidenced by provider attestation and co-signature . Inter-disciplinary care team collaboration (see longitudinal plan of care) . Reviewed medications and home BP readings. Reports taking medication as prescribed and taking supplements as advised.Notes some home BP readings have been elevated with systolic readings in the 734'L and 160's. Reports diastolic readings have remained within range. Denies chest pain, palpitations or headaches. Reports great compliance with maintaining a cardiac/heart healthy diet. Activity remains limited d/t lumbar procedure, however she remains active and continues to engage in daily low impact activities. . Reviewed s/sx of heart attack, stroke and  worsening symptoms that require immediate medical attention.    Patient Goals:Self-Care Activities: -Continue  administering medications as prescribed -Attend all scheduled provider appointments -Continue monitoring and recording blood pressure and notify provider for readings outside of the established range. -Continue adherence to recommended diet -Contact provider or care management team with questions and new concerns as needed   Follow Up Plan: Will follow up next month   Patient Care Plan: Increased Risk for Falls    Problem Identified: Mobility and Function     Long-Range Goal: Maintain Mobility and Function Completed 08/31/2020  Start Date: 04/30/2020  Expected End Date: 06/30/2020  Priority: High  Note:   Current Barriers:  . S/P Posterior Cervical Fusion. Increased risk for falls d/t limited mobility.  Clinical Goal(s):  Marland Kitchen Over the next 90 days patient will not experience falls or require emergent care d/t fall related injuries.  Interventions:  . Collaboration with Virginia Crews, MD regarding development and update of comprehensive plan of care as evidenced by provider attestation and co-signature . Inter-disciplinary care team collaboration (see longitudinal plan of care) . Reviewed home safety and fall prevention measures. Reviewed current activity level and ability to perform ADL's. Reports using caution when ambulating and following recommended activity restrictions. Her activity remains limited d/t the lumbar procedure, however it has not impacted her ability to perform ADL's. She reports walking approximately a mile a day. She continues to receive assistance with light housekeeping once a week. Denies recent falls. Declines need for additional in-home assistance. . Discussed recommendations outlined by the Neurosurgery team. Currently engaged with the Occupational Therapy team. Reports therapy  was recently held d/t numbness in her arms and shoulders. Pending updated treatment plan and feedback from the Neurosurgery team.     Patient Goals/Self-Care Activities - Utilize  assistive device appropriately with all ambulation - Change positions slowly and avoid over strenuous activities - Wear secure non skid footwear when ambulating - Keep pathways clear and utilize home lighting for dim lit areas - Follow up plan for Occupational Therapy as outlined by the Neurosurgery team   Goal Met       PLAN A member of the care management team will follow up with Mrs. Stfort next month.   [facility] [phone]

## 2020-09-07 NOTE — Patient Instructions (Signed)
Thank you for allowing the Chronic Care Management team to participate in your care. It was a pleasure speaking with you today. Please feel free to contact me with questions.    Goals Addressed: Patient Care Plan: Diabetes Type 2 (Adult)    Problem Identified: Disease Progression (Diabetes, Type 2)     Long-Range Goal: Disease Progression Prevented or Minimized   Start Date: 08/31/2020  Expected End Date: 12/29/2020  Priority: High  Note:   Objective:   . Lab Results .  Component . Value . Date .   Marland Kitchen HGBA1C . 7.4 (H) . 07/23/2020    Current Barriers:  . Chronic disease management and support related to Diabetes self-management  Case Manager Clinical Goal(s):  Marland Kitchen Over the next 120 days, patient will demonstrate continued adherence to prescribed treatment plan for Diabetes self management as evidenced by daily monitoring and recording of CBG, adherence to ADA/ carb modified diet and adherence to prescribed medication regimen.  Interventions:  . Collaboration with Virginia Crews, MD regarding development and update of comprehensive plan of care as evidenced by provider attestation and co-signature . Inter-disciplinary care team collaboration (see longitudinal plan of care) . Reviewed medications and discussed blood glucose readings. Reviewed s/sx of hypoglycemia and hyperglycemia along with recommended interventions. Reports taking medications as prescribed. Notes blood glucose readings have ranged in the 120's and 130's.  Discussed her recent A1C increase from 6.8 to 7.4%. Reports attempting to comply with the recommended diabetic diet. Declined need for additional educational or nutritional resources. . Discussed recommended DM preventive care. She continues to perform foot care and completes exams as recommended.    Patient Goals/Self-Care Activities -Continue administering medications as prescribed -Attend all scheduled provider appointments -Monitor blood glucose levels  consistently and utilize recommended interventions -Continue adherence to prescribed ADA/carb modified -Notify provider or care management team with questions and new concerns as needed.   Follow Up Plan:  Will follow up next month       Patient Care Plan: Hypertension (Adult)    Problem Identified: Hypertension (Hypertension)     Long-Range Goal: Hypertension Monitored   Start Date: 08/31/2020  Expected End Date: 12/29/2020  Priority: High  Note:   Objective:  . Last practice recorded BP readings:   BP Readings from Last 3 Encounters:  07/21/20 (!) 146/72  04/03/20 (!) 164/69  03/24/20 (!) 158/69    Current Barriers:  . Chronic Disease Management needs and support related Hypertension management.  Case Manager Clinical Goal(s):  Marland Kitchen Over the next 120 days, patient will demonstrate adherence to prescribed treatment plan for hypertension as evidenced by taking all medications as prescribed, monitoring and recording blood pressure, and adhering to a cardiac prudent/heart healthy diet.  Interventions:  . Collaboration with Virginia Crews, MD regarding development and update of comprehensive plan of care as evidenced by provider attestation and co-signature . Inter-disciplinary care team collaboration (see longitudinal plan of care) . Reviewed medications and home BP readings. Reports taking medication as prescribed and taking supplements as advised.Notes some home BP readings have been elevated with systolic readings in the 086'V and 160's. Reports diastolic readings have remained within range. Denies chest pain, palpitations or headaches. Reports great compliance with maintaining a cardiac/heart healthy diet. Activity remains limited d/t lumbar procedure, however she remains active and continues to engage in daily low impact activities. . Reviewed s/sx of heart attack, stroke and  worsening symptoms that require immediate medical attention.    Patient Goals:Self-Care  Activities: -Continue administering medications  as prescribed -Attend all scheduled provider appointments -Continue monitoring and recording blood pressure and notify provider for readings outside of the established range. -Continue adherence to recommended diet -Contact provider or care management team with questions and new concerns as needed   Follow Up Plan: Will follow up next month   Patient Care Plan: Increased Risk for Falls    Problem Identified: Mobility and Function     Long-Range Goal: Maintain Mobility and Function Completed 08/31/2020  Start Date: 04/30/2020  Expected End Date: 06/30/2020  Priority: High  Note:   Current Barriers:  . S/P Posterior Cervical Fusion. Increased risk for falls d/t limited mobility.  Clinical Goal(s):  Marland Kitchen Over the next 90 days patient will not experience falls or require emergent care d/t fall related injuries.  Interventions:  . Collaboration with Virginia Crews, MD regarding development and update of comprehensive plan of care as evidenced by provider attestation and co-signature . Inter-disciplinary care team collaboration (see longitudinal plan of care) . Reviewed home safety and fall prevention measures. Reviewed current activity level and ability to perform ADL's. Reports using caution when ambulating and following recommended activity restrictions. Her activity remains limited d/t the lumbar procedure, however it has not impacted her ability to perform ADL's. She reports walking approximately a mile a day. She continues to receive assistance with light housekeeping once a week. Denies recent falls. Declines need for additional in-home assistance. . Discussed recommendations outlined by the Neurosurgery team. Currently engaged with the Occupational Therapy team. Reports therapy was recently held d/t numbness in her arms and shoulders. Pending updated treatment plan and feedback from the Neurosurgery team.     Patient Goals/Self-Care  Activities - Utilize assistive device appropriately with all ambulation - Change positions slowly and avoid over strenuous activities - Wear secure non skid footwear when ambulating - Keep pathways clear and utilize home lighting for dim lit areas - Follow up plan for Occupational Therapy as outlined by the Neurosurgery team   Goal Met       Jessica Wheeler verbalized understanding of the information discussed during the telephonic outreach today. Declined need for mailed/printed instructions. A member of the care management team will follow up with Jessica Wheeler next month.   [facility] Care [facility] [phone]

## 2020-09-08 ENCOUNTER — Telehealth: Payer: Self-pay | Admitting: Family Medicine

## 2020-09-08 NOTE — Telephone Encounter (Signed)
Pt states she spoke with you about her legs and having cramps at night.  Sometimes in the day as well. Dr B does not have any appointments. But pt states you mentioned saying something to the dr on her behalf

## 2020-09-10 ENCOUNTER — Other Ambulatory Visit: Payer: Self-pay

## 2020-09-10 ENCOUNTER — Ambulatory Visit (INDEPENDENT_AMBULATORY_CARE_PROVIDER_SITE_OTHER): Payer: Medicare HMO | Admitting: Family Medicine

## 2020-09-10 ENCOUNTER — Encounter: Payer: Self-pay | Admitting: Family Medicine

## 2020-09-10 VITALS — BP 152/65 | HR 82 | Temp 98.3°F | Wt 143.0 lb

## 2020-09-10 DIAGNOSIS — I10 Essential (primary) hypertension: Secondary | ICD-10-CM | POA: Diagnosis not present

## 2020-09-10 DIAGNOSIS — E871 Hypo-osmolality and hyponatremia: Secondary | ICD-10-CM | POA: Diagnosis not present

## 2020-09-10 DIAGNOSIS — R252 Cramp and spasm: Secondary | ICD-10-CM

## 2020-09-10 NOTE — Progress Notes (Signed)
Established patient visit   Patient: Jessica Wheeler   DOB: 12/14/1940   80 y.o. Female  MRN: [id number] Visit Date: 09/10/2020  Today's healthcare provider: Vernie Murders, PA-C   Chief Complaint  Patient presents with   Hypertension   Subjective    HPI  Hypertension, follow-up  BP Readings from Last 3 Encounters:  09/10/20 (!) 152/65  07/21/20 (!) 146/72  04/03/20 (!) 164/69   Wt Readings from Last 3 Encounters:  09/10/20 143 lb (64.9 kg)  07/21/20 142 lb (64.4 kg)  04/01/20 140 lb 7.7 oz (63.7 kg)     She was last seen for hypertension 2 months ago.  Management since that visit includes cutting amiloride/HCTZ to 1/4 tablet, secondary to muscle cramps.   She reports excellent compliance with treatment. She is having side effects. Pt states her muscle cramps have improved ,but is experiencing more shortness of breath since reducing her amiloride/HCTZ.   She is following a Regular diet. She is exercising. She does not smoke.  Use of agents associated with hypertension: none.   Outside blood pressures are 120-140's/50-60's. Symptoms: No chest pain No chest pressure  No palpitations No syncope  No dyspnea No orthopnea  No paroxysmal nocturnal dyspnea Yes lower extremity edema   Pertinent labs: Lab Results  Component Value Date   CHOL 190 03/13/2020   HDL 84 03/13/2020   LDLCALC 89 03/13/2020   TRIG 95 03/13/2020   CHOLHDL 2.3 03/13/2020   Lab Results  Component Value Date   NA 132 (L) 07/23/2020   K 4.5 07/23/2020   CREATININE 0.68 07/23/2020   GFRNONAA >60 03/31/2020   GFRAA 82 03/13/2020   GLUCOSE 150 (H) 07/23/2020     The ASCVD Risk score (Goff DC Jr., et al., 2013) failed to calculate for the following reasons:   The 2013 ASCVD risk score is only valid for ages 48 to 7   ---------------------------------------------------------------------------------------------------  Patient Active Problem List   Diagnosis Date Noted   Cervical  spondylosis with myelopathy and radiculopathy 04/01/2020   Lumbar adjacent segment disease with spondylolisthesis 03/30/2020   Overactive bladder 01/17/2020   Overweight 07/04/2019   Hyperlipidemia associated with type 2 diabetes mellitus ([place]) 03/15/2019   Fatigue 12/11/2017   Primary osteoarthritis of left knee 03/10/2016   Depression 12/28/2015   Allergic rhinitis 01/31/2015   Benign paroxysmal positional nystagmus 01/31/2015   Bladder infection, chronic 01/31/2015   Glaucoma 01/31/2015   Chronic hyponatremia 01/31/2015   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 01/31/2015   Anxiety 11/04/2014   Fibrocystic breast disease 04/30/2013   Renal artery stenosis ([facility]) 04/18/2012   Chest pain 10/10/2011   Murmur 10/10/2011   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[place]) 03/31/2011   Hypertension associated with diabetes ([place]) 08/03/2010   CAD (coronary artery disease) 08/03/2010   DM (diabetes mellitus) ([place]) 08/03/2010   Past Medical History:  Diagnosis Date   Allergy    Arthritis    Bundle branch block, right    C7 cervical fracture ([place])    Diabetes mellitus    Diffuse cystic mastopathy    Dissection, aorta ([place])    Dr.Dew follows   Diverticulosis    Family history of malignant neoplasm of gastrointestinal tract    Glaucoma 2005   Heart murmur    History of bronchitis    History of pancreatitis 2005   Hyperlipidemia    Hypertension 1982   Personal history of tobacco use, presenting hazards to health    RBBB  Ulcer    Social History   Tobacco Use   Smoking status: Former Smoker    Packs/day: 1.00    Years: 15.00    Pack years: 15.00    Types: Cigarettes    Quit date: 08/03/2003    Years since quitting: 17.1   Smokeless tobacco: Never Used  Vaping Use   Vaping Use: Never used  Substance Use Topics   Alcohol use: No   Drug use: No   Allergies  Allergen Reactions   Beta Adrenergic Blockers     Chest pressure/difficulty breathing   Jardiance [Empagliflozin]      Muscle cramps   Levaquin [Levofloxacin In D5w]     Low blood sugar; does take this when she needs it. She just makes sure to control her BS to avoid hypoglycemia.   Lisinopril Cough   Zetia [Ezetimibe]     Lethargic    Prevnar [Pneumococcal 13-Val Conj Vacc] Rash    Joint Pain   Sulfa Antibiotics Rash    "makes my skin raw"     Medications: Outpatient Medications Prior to Visit  Medication Sig   Accu-Chek FastClix Lancets MISC 1 strip by Does not apply route daily.   amiloride-hydrochlorothiazide (MODURETIC) 5-50 MG tablet TAKE 1/2 TABLET EVERY DAY   amLODipine (NORVASC) 5 MG tablet Take 1 tablet (5 mg total) by mouth daily.   aspirin 81 MG EC tablet Take 81 mg by mouth daily.   Blood Glucose Monitoring Suppl (ACCU-CHEK GUIDE) w/Device KIT 1 each by Does not apply route as directed.   Cyanocobalamin (VITAMIN B-12) 1000 MCG/15ML LIQD Take 1,000 mcg by mouth daily.    cyclobenzaprine (FLEXERIL) 5 MG tablet Take 1 tablet (5 mg total) by mouth 3 (three) times daily as needed for muscle spasms.   glucose blood (ACCU-CHEK GUIDE) test strip Use as instructed   ibuprofen (ADVIL) 200 MG tablet Take 200 mg by mouth daily at 6 (six) AM.   Lancets Misc. (ACCU-CHEK FASTCLIX LANCET) KIT 1 each by Does not apply route as directed.   latanoprost (XALATAN) 0.005 % ophthalmic solution Place 1 drop into both eyes at bedtime.    losartan (COZAAR) 100 MG tablet Take 1 tablet (100 mg total) by mouth daily.   Magnesium 250 MG TABS Take 250 mg by mouth daily.   metFORMIN (GLUCOPHAGE) 500 MG tablet TAKE 2 TABLETS BY MOUTH TWICE DAILY WITH A MEAL   No facility-administered medications prior to visit.    Review of Systems  Constitutional: Negative.   Respiratory: Positive for cough and shortness of breath. Negative for apnea, choking, chest tightness, wheezing and stridor.   Cardiovascular: Positive for leg swelling. Negative for chest pain and palpitations.  Gastrointestinal: Negative.   Musculoskeletal:  Positive for myalgias.  Neurological: Negative for dizziness, light-headedness and headaches.     Objective    BP (!) 152/65 (BP Location: Right Arm, Patient Position: Sitting, Cuff Size: Normal)   Pulse 82   Temp 98.3 F (36.8 C) (Oral)   Wt 143 lb (64.9 kg)   SpO2 99%   BMI 25.34 kg/m    Physical Exam Constitutional:      General: She is not in acute distress.    Appearance: She is well-developed.  HENT:     Head: Normocephalic and atraumatic.     Right Ear: Hearing normal.     Left Ear: Hearing normal.     Nose: Nose normal.  Eyes:     General: Lids are normal. No scleral icterus.  Right eye: No discharge.        Left eye: No discharge.     Conjunctiva/sclera: Conjunctivae normal.  Cardiovascular:     Rate and Rhythm: Normal rate and regular rhythm.     Pulses: Normal pulses.     Heart sounds: Normal heart sounds.  Pulmonary:     Effort: Pulmonary effort is normal. No respiratory distress.  Abdominal:     General: Bowel sounds are normal.     Palpations: Abdomen is soft.  Musculoskeletal:        General: No swelling. Normal range of motion.     Cervical back: Normal range of motion.  Skin:    Findings: No lesion or rash.  Neurological:     Mental Status: She is alert and oriented to person, place, and time.  Psychiatric:        Speech: Speech normal.        Behavior: Behavior normal.        Thought Content: Thought content normal.       No results found for any visits on 09/10/20.  Assessment & Plan     1. Essential hypertension BP with elevation of systolic pressure. Presently on Moduretic 5-50 mg 1/2 tablet qd and Losartan 100 mg qd. Better control of muscle cramps. Will check labs. - Magnesium - Basic Metabolic Panel (BMET) - CBC with Differential/Platelet  2. Muscle cramps Intermittent muscle cramps. Check labs for electrolyte and magnesium levels dropping. - Magnesium - Basic Metabolic Panel (BMET)  3. Chronic hyponatremia Last sodium  level was 132 on 07-23-20. Was 126 on 03-31-20. Recheck labs. - Basic Metabolic Panel (BMET) - CBC with Differential/Platelet   No follow-ups on file.      I, Hailea Eaglin, PA-C, have reviewed all documentation for this visit. The documentation on 09/10/20 for the exam, diagnosis, procedures, and orders are all accurate and complete.    Vernie Murders, PA-C  [employer] [phone] (phone) [phone] (fax)  [place]

## 2020-09-10 NOTE — Patient Instructions (Signed)
Hypertension, Adult High blood pressure (hypertension) is when the force of blood pumping through the arteries is too strong. The arteries are the blood vessels that carry blood from the heart throughout the body. Hypertension forces the heart to work harder to pump blood and may cause arteries to become narrow or stiff. Untreated or uncontrolled hypertension can cause a heart attack, heart failure, a stroke, kidney disease, and other problems. A blood pressure reading consists of a higher number over a lower number. Ideally, your blood pressure should be below 120/80. The first ("top") number is called the systolic pressure. It is a measure of the pressure in your arteries as your heart beats. The second ("bottom") number is called the diastolic pressure. It is a measure of the pressure in your arteries as the heart relaxes. What are the causes? The exact cause of this condition is not known. There are some conditions that result in or are related to high blood pressure. What increases the risk? Some risk factors for high blood pressure are under your control. The following factors may make you more likely to develop this condition:  Smoking.  Having type 2 diabetes mellitus, high cholesterol, or both.  Not getting enough exercise or physical activity.  Being overweight.  Having too much fat, sugar, calories, or salt (sodium) in your diet.  Drinking too much alcohol. Some risk factors for high blood pressure may be difficult or impossible to change. Some of these factors include:  Having chronic kidney disease.  Having a family history of high blood pressure.  Age. Risk increases with age.  Race. You may be at higher risk if you are African American.  Gender. Men are at higher risk than women before age 45. After age 65, women are at higher risk than men.  Having obstructive sleep apnea.  Stress. What are the signs or symptoms? High blood pressure may not cause symptoms. Very high  blood pressure (hypertensive crisis) may cause:  Headache.  Anxiety.  Shortness of breath.  Nosebleed.  Nausea and vomiting.  Vision changes.  Severe chest pain.  Seizures. How is this diagnosed? This condition is diagnosed by measuring your blood pressure while you are seated, with your arm resting on a flat surface, your legs uncrossed, and your feet flat on the floor. The cuff of the blood pressure monitor will be placed directly against the skin of your upper arm at the level of your heart. It should be measured at least twice using the same arm. Certain conditions can cause a difference in blood pressure between your right and left arms. Certain factors can cause blood pressure readings to be lower or higher than normal for a short period of time:  When your blood pressure is higher when you are in a health care provider's office than when you are at home, this is called white coat hypertension. Most people with this condition do not need medicines.  When your blood pressure is higher at home than when you are in a health care provider's office, this is called masked hypertension. Most people with this condition may need medicines to control blood pressure. If you have a high blood pressure reading during one visit or you have normal blood pressure with other risk factors, you may be asked to:  Return on a different day to have your blood pressure checked again.  Monitor your blood pressure at home for 1 week or longer. If you are diagnosed with hypertension, you may have other blood or   imaging tests to help your health care provider understand your overall risk for other conditions. How is this treated? This condition is treated by making healthy lifestyle changes, such as eating healthy foods, exercising more, and reducing your alcohol intake. Your health care provider may prescribe medicine if lifestyle changes are not enough to get your blood pressure under control, and  if:  Your systolic blood pressure is above 130.  Your diastolic blood pressure is above 80. Your personal target blood pressure may vary depending on your medical conditions, your age, and other factors. Follow these instructions at home: Eating and drinking  Eat a diet that is high in fiber and potassium, and low in sodium, added sugar, and fat. An example eating plan is called the DASH (Dietary Approaches to Stop Hypertension) diet. To eat this way: ? Eat plenty of fresh fruits and vegetables. Try to fill one half of your plate at each meal with fruits and vegetables. ? Eat whole grains, such as whole-wheat pasta, brown rice, or whole-grain bread. Fill about one fourth of your plate with whole grains. ? Eat or drink low-fat dairy products, such as skim milk or low-fat yogurt. ? Avoid fatty cuts of meat, processed or cured meats, and poultry with skin. Fill about one fourth of your plate with lean proteins, such as fish, chicken without skin, beans, eggs, or tofu. ? Avoid pre-made and processed foods. These tend to be higher in sodium, added sugar, and fat.  Reduce your daily sodium intake. Most people with hypertension should eat less than 1,500 mg of sodium a day.  Do not drink alcohol if: ? Your health care provider tells you not to drink. ? You are pregnant, may be pregnant, or are planning to become pregnant.  If you drink alcohol: ? Limit how much you use to:  0-1 drink a day for women.  0-2 drinks a day for men. ? Be aware of how much alcohol is in your drink. In the [place] one drink equals one 12 oz bottle of beer (355 mL), one 5 oz glass of wine (148 mL), or one 1 oz glass of hard liquor (44 mL).   Lifestyle  Work with your health care provider to maintain a healthy body weight or to lose weight. Ask what an ideal weight is for you.  Get at least 30 minutes of exercise most days of the week. Activities may include walking, swimming, or biking.  Include exercise to  strengthen your muscles (resistance exercise), such as Pilates or lifting weights, as part of your weekly exercise routine. Try to do these types of exercises for 30 minutes at least 3 days a week.  Do not use any products that contain nicotine or tobacco, such as cigarettes, e-cigarettes, and chewing tobacco. If you need help quitting, ask your health care provider.  Monitor your blood pressure at home as told by your health care provider.  Keep all follow-up visits as told by your health care provider. This is important.   Medicines  Take over-the-counter and prescription medicines only as told by your health care provider. Follow directions carefully. Blood pressure medicines must be taken as prescribed.  Do not skip doses of blood pressure medicine. Doing this puts you at risk for problems and can make the medicine less effective.  Ask your health care provider about side effects or reactions to medicines that you should watch for. Contact a health care provider if you:  Think you are having a reaction to a   medicine you are taking.  Have headaches that keep coming back (recurring).  Feel dizzy.  Have swelling in your ankles.  Have trouble with your vision. Get help right away if you:  Develop a severe headache or confusion.  Have unusual weakness or numbness.  Feel faint.  Have severe pain in your chest or abdomen.  Vomit repeatedly.  Have trouble breathing. Summary  Hypertension is when the force of blood pumping through your arteries is too strong. If this condition is not controlled, it may put you at risk for serious complications.  Your personal target blood pressure may vary depending on your medical conditions, your age, and other factors. For most people, a normal blood pressure is less than 120/80.  Hypertension is treated with lifestyle changes, medicines, or a combination of both. Lifestyle changes include losing weight, eating a healthy, low-sodium diet,  exercising more, and limiting alcohol. This information is not intended to replace advice given to you by your health care provider. Make sure you discuss any questions you have with your health care provider. Document Revised: 12/27/2017 Document Reviewed: 12/27/2017 Elsevier Patient Education  2021 [employer] Inc. Hyponatremia Hyponatremia is when the amount of salt (sodium) in your blood is too low. When sodium levels are low, your cells absorb extra water, which causes them to swell. The swelling happens throughout the body, but it mostly affects the brain. What are the causes? This condition may be caused by:  Certain medical conditions, such as: ? Heart, kidney, or liver problems. ? Thyroid problems. ? Adrenal gland problems. ? Metabolic conditions, such as Addison disease or syndrome of inappropriate antidiuretic hormone (SIADH).  Severe vomiting or diarrhea.  Certain medicines or illegal drugs.  Dehydration.  Drinking too much water.  Eating a diet that is low in sodium.  Large burns on your body.  Excessive sweating. What increases the risk? You are more likely to develop this condition if you:  Have long-term (chronic) kidney disease.  Have heart failure.  Have a medical condition that causes frequent or excessive diarrhea.  Participate in intense physical activities, such as marathon running.  Take certain medicines that affect the sodium and fluid balance in the blood. Some of these medicine types include: ? Diuretics. ? NSAIDs. ? Some opioid pain medicines. ? Some antidepressants. ? Some seizure prevention medicines. What are the signs or symptoms? Symptoms of this condition include:  Headache.  Nausea and vomiting.  Being very tired (lethargic).  Muscle weakness and cramping.  Loss of appetite.  Feeling weak or light-headed. Severe symptoms of this condition include:  Confusion.  Agitation.  Having a rapid heart rate.  Passing out  (fainting).  Seizures.  Coma. How is this diagnosed? This condition is diagnosed based on:  A physical exam.  Your medical history.  Tests, including: ? Blood tests. ? Urine tests. How is this treated? Treatment for this condition depends on the cause. Treatment may include:  Getting fluids through an IV that is inserted into one of your veins.  Medicines to correct the sodium imbalance. If medicines are causing the condition, the medicines will need to be adjusted.  Limiting your water or fluid intake to get the correct sodium balance.  Monitoring in the hospital setting to closely watch your symptoms for improvement.   Follow these instructions at home:  Take over-the-counter and prescription medicines only as told by your health care provider. Many medicines can make this condition worse. Talk with your health care provider about any medicines that  you are currently taking.  Carefully follow a recommended diet as told by your health care provider.  Carefully follow instructions from your health care provider about fluid restrictions.  Do not drink alcohol.  Keep all follow-up visits as told by your health care provider. This is important.   Contact a health care provider if:  You develop worsening nausea, fatigue, headache, confusion, or weakness.  Your symptoms go away and then return.  You have problems following the recommended diet. Get help right away if:  You have a seizure.  You pass out.  You have ongoing diarrhea or vomiting. Summary  Hyponatremia is when the amount of salt (sodium) in your blood is too low.  When sodium levels are low, your cells absorb extra water, which causes them to swell.  The swelling happens throughout the body, but it mostly affects the brain.  Treatment for this condition depends on the cause. It may include IV fluids, medicines, and limiting your fluid intake. This information is not intended to replace advice given to  you by your health care provider. Make sure you discuss any questions you have with your health care provider. Document Revised: 03/02/2018 Document Reviewed: 03/02/2018 Elsevier Patient Education  2021 [employer] Inc. Muscle Cramps and Spasms Muscle cramps and spasms occur when a muscle or muscles tighten and you have no control over this tightening (involuntary muscle contraction). They are a common problem and can develop in any muscle. The most common place is in the calf muscles of the leg. Muscle cramps and muscle spasms are both involuntary muscle contractions, but there are some differences between the two:  Muscle cramps are painful. They come and go and may last for a few seconds or up to 15 minutes. Muscle cramps are often more forceful and last longer than muscle spasms.  Muscle spasms may or may not be painful. They may also last just a few seconds or much longer. Certain medical conditions, such as diabetes or Parkinson's disease, can make it more likely to develop cramps or spasms. However, cramps or spasms are usually not caused by a serious underlying problem. Common causes include:  Doing more physical work or exercise than your body is ready for (overexertion).  Overuse from repeating certain movements too many times.  Remaining in a certain position for a long period of time.  Improper preparation, form, or technique while playing a sport or doing an activity.  Dehydration.  Injury.  Side effects of some medicines.  Abnormally low levels of the salts and minerals in your blood (electrolytes), especially potassium and calcium. This could happen if you are taking water pills (diuretics) or if you are pregnant. In many cases, the cause of muscle cramps or spasms is not known. Follow these instructions at home: Managing pain and stiffness  Try massaging, stretching, and relaxing the affected muscle. Do this for several minutes at a time.  If directed, apply heat to  tight or tense muscles as often as told by your health care provider. Use the heat source that your health care provider recommends, such as a moist heat pack or a heating pad. ? Place a towel between your skin and the heat source. ? Leave the heat on for 20-30 minutes. ? Remove the heat if your skin turns bright red. This is especially important if you are unable to feel pain, heat, or cold. You may have a greater risk of getting burned.  If directed, put ice on the affected area. This may  help if you are sore or have pain after a cramp or spasm. ? Put ice in a plastic bag. ? Place a towel between your skin and the bag. ? Leavethe ice on for 20 minutes, 2-3 times a day.  Try taking hot showers or baths to help relax tight muscles.      Eating and drinking  Drink enough fluid to keep your urine pale yellow. Staying well hydrated may help prevent cramps or spasms.  Eat a healthy diet that includes plenty of nutrients to help your muscles function. A healthy diet includes fruits and vegetables, lean protein, whole grains, and low-fat or nonfat dairy products. General instructions  If you are having frequent cramps, avoid intense exercise for several days.  Take over-the-counter and prescription medicines only as told by your health care provider.  Pay attention to any changes in your symptoms.  Keep all follow-up visits as told by your health care provider. This is important. Contact a health care provider if:  Your cramps or spasms get more severe or happen more often.  Your cramps or spasms do not improve over time. Summary  Muscle cramps and spasms occur when a muscle or muscles tighten and you have no control over this tightening (involuntary muscle contraction).  The most common place for cramps or spasms to occur is in the calf muscles of the leg.  Massaging, stretching, and relaxing the affected muscle may relieve the cramp or spasm.  Drink enough fluid to keep your  urine pale yellow. Staying well hydrated may help prevent cramps or spasms. This information is not intended to replace advice given to you by your health care provider. Make sure you discuss any questions you have with your health care provider. Document Revised: 09/11/2017 Document Reviewed: 09/11/2017 Elsevier Patient Education  2021 [employer].

## 2020-09-11 LAB — CBC WITH DIFFERENTIAL/PLATELET
Basophils Absolute: 0.1 10*3/uL (ref 0.0–0.2)
Basos: 1 %
EOS (ABSOLUTE): 0.1 10*3/uL (ref 0.0–0.4)
Eos: 2 %
Hematocrit: 38.8 % (ref 34.0–46.6)
Hemoglobin: 13.2 g/dL (ref 11.1–15.9)
Immature Grans (Abs): 0 10*3/uL (ref 0.0–0.1)
Immature Granulocytes: 0 %
Lymphocytes Absolute: 2.4 10*3/uL (ref 0.7–3.1)
Lymphs: 28 %
MCH: 30.8 pg (ref 26.6–33.0)
MCHC: 34 g/dL (ref 31.5–35.7)
MCV: 90 fL (ref 79–97)
Monocytes Absolute: 0.7 10*3/uL (ref 0.1–0.9)
Monocytes: 8 %
Neutrophils Absolute: 5.1 10*3/uL (ref 1.4–7.0)
Neutrophils: 61 %
Platelets: 287 10*3/uL (ref 150–450)
RBC: 4.29 x10E6/uL (ref 3.77–5.28)
RDW: 13.2 % (ref 11.7–15.4)
WBC: 8.3 10*3/uL (ref 3.4–10.8)

## 2020-09-11 LAB — BASIC METABOLIC PANEL
BUN/Creatinine Ratio: 19 (ref 12–28)
BUN: 14 mg/dL (ref 8–27)
CO2: 20 mmol/L (ref 20–29)
Calcium: 10.2 mg/dL (ref 8.7–10.3)
Chloride: 94 mmol/L — ABNORMAL LOW (ref 96–106)
Creatinine, Ser: 0.74 mg/dL (ref 0.57–1.00)
Glucose: 131 mg/dL — ABNORMAL HIGH (ref 65–99)
Potassium: 4.1 mmol/L (ref 3.5–5.2)
Sodium: 134 mmol/L (ref 134–144)
eGFR: 81 mL/min/{1.73_m2} (ref >59)

## 2020-09-11 LAB — MAGNESIUM: Magnesium: 2 mg/dL (ref 1.6–2.3)

## 2020-09-23 DIAGNOSIS — M4316 Spondylolisthesis, lumbar region: Secondary | ICD-10-CM | POA: Diagnosis not present

## 2020-09-23 DIAGNOSIS — M47896 Other spondylosis, lumbar region: Secondary | ICD-10-CM | POA: Diagnosis not present

## 2020-09-28 ENCOUNTER — Other Ambulatory Visit: Payer: Self-pay | Admitting: Family Medicine

## 2020-09-28 DIAGNOSIS — E1169 Type 2 diabetes mellitus with other specified complication: Secondary | ICD-10-CM

## 2020-10-02 ENCOUNTER — Other Ambulatory Visit: Payer: Self-pay | Admitting: Family Medicine

## 2020-10-02 DIAGNOSIS — I10 Essential (primary) hypertension: Secondary | ICD-10-CM

## 2020-10-08 DIAGNOSIS — Z20822 Contact with and (suspected) exposure to covid-19: Secondary | ICD-10-CM | POA: Diagnosis not present

## 2020-10-19 DIAGNOSIS — M5416 Radiculopathy, lumbar region: Secondary | ICD-10-CM | POA: Diagnosis not present

## 2020-10-19 DIAGNOSIS — I10 Essential (primary) hypertension: Secondary | ICD-10-CM | POA: Diagnosis not present

## 2020-10-19 DIAGNOSIS — M4316 Spondylolisthesis, lumbar region: Secondary | ICD-10-CM | POA: Diagnosis not present

## 2020-10-19 DIAGNOSIS — Z6825 Body mass index (BMI) 25.0-25.9, adult: Secondary | ICD-10-CM | POA: Diagnosis not present

## 2020-10-27 DIAGNOSIS — H43813 Vitreous degeneration, bilateral: Secondary | ICD-10-CM | POA: Diagnosis not present

## 2020-10-27 DIAGNOSIS — E119 Type 2 diabetes mellitus without complications: Secondary | ICD-10-CM | POA: Diagnosis not present

## 2020-10-27 DIAGNOSIS — H401131 Primary open-angle glaucoma, bilateral, mild stage: Secondary | ICD-10-CM | POA: Diagnosis not present

## 2020-10-27 DIAGNOSIS — Z7984 Long term (current) use of oral hypoglycemic drugs: Secondary | ICD-10-CM | POA: Diagnosis not present

## 2020-10-27 DIAGNOSIS — Z961 Presence of intraocular lens: Secondary | ICD-10-CM | POA: Diagnosis not present

## 2020-11-10 ENCOUNTER — Ambulatory Visit (INDEPENDENT_AMBULATORY_CARE_PROVIDER_SITE_OTHER): Payer: Medicare HMO

## 2020-11-10 DIAGNOSIS — E1169 Type 2 diabetes mellitus with other specified complication: Secondary | ICD-10-CM | POA: Diagnosis not present

## 2020-11-10 DIAGNOSIS — I152 Hypertension secondary to endocrine disorders: Secondary | ICD-10-CM | POA: Diagnosis not present

## 2020-11-10 DIAGNOSIS — E1159 Type 2 diabetes mellitus with other circulatory complications: Secondary | ICD-10-CM | POA: Diagnosis not present

## 2020-11-10 DIAGNOSIS — Z9181 History of falling: Secondary | ICD-10-CM

## 2020-11-10 NOTE — Patient Instructions (Addendum)
Thank you for allowing the Chronic Care Management team to participate in your care.   Goals Addressed: Care Plan : Diabetes Type 2 (Adult)  Updates made by Juanell Fairly, RN since 11/18/2020 12:00 AM     Problem: Disease Progression (Diabetes, Type 2)      Long-Range Goal: Disease Progression Prevented or Minimized   Start Date: 08/31/2020  Expected End Date: 12/29/2020  Priority: High  Note:   Current Barriers:  Chronic disease management and support related to Diabetes self-management  Case Manager Clinical Goal(s):  Over the next 120 days, patient will demonstrate continued adherence to prescribed treatment plan for Diabetes self management as evidenced by daily monitoring and recording of CBG, adherence to ADA/ carb modified diet and adherence to prescribed medication regimen.  Interventions:  Collaboration with Beryle Flock Marzella Schlein, MD regarding development and update of comprehensive plan of care as evidenced by provider attestation and co-signature Inter-disciplinary care team collaboration (see longitudinal plan of care) Reviewed blood glucose readings and compliance with treatment plan. She was evaluated by [facility] d/t severe lower back pain. Prednisone was prescribed. Recalls fasting blood glucose levels were elevated with readings in the 200's immediately after starting prednisone. Reports readings in the past few days have ranged in the low to mid 140's. She is very knowledgeable of s/sx of hypoglycemia and hyperglycemia along with appropriate interventions. A1C is currently not at goal. Reports monitoring her levels consistently and complying a with a diabetic diet. Remains compliant with DM preventive care exams.    Patient Goals/Self-Care Activities -Continue administering medications as prescribed -Attend all scheduled provider appointments -Monitor blood glucose levels consistently and utilize recommended interventions -Continue adherence to prescribed ADA/carb  modified -Notify provider or care management team with questions and new concerns as needed.   Follow Up Plan:  Will follow up next month        Care Plan : Hypertension (Adult)       Problem: Hypertension (Hypertension)      Long-Range Goal: Hypertension Monitored   Start Date: 08/31/2020  Expected End Date: 12/29/2020  Priority: High  Note:   Objective:  BP Readings from Last 3 Encounters:  09/10/20 (!) 152/65  07/21/20 (!) 146/72  04/03/20 (!) 164/69  Current Barriers:  Chronic Disease Management needs and support related Hypertension management.  Case Manager Clinical Goal(s):  Over the next 120 days, patient will demonstrate adherence to prescribed treatment plan for hypertension as evidenced by taking all medications as prescribed, monitoring and recording blood pressure, and adhering to a cardiac prudent/heart healthy diet.  Interventions:  Collaboration with Beryle Flock Marzella Schlein, MD regarding development and update of comprehensive plan of care as evidenced by provider attestation and co-signature Inter-disciplinary care team collaboration (see longitudinal plan of care) Reviewed medications and home BP readings. Clinic readings have been elevated but reports home readings have been within range. Reports systolic readings have ranged in the 120's. Diastolic readings have ranged in the 50's and 60's. Her activity has declined d/t recent episodes of severe back pain but she continues to walk daily. Reports doing very well with maintaining a heart healthy diet.  Reviewed s/sx of heart attack, stroke and  worsening symptoms that require immediate medical attention.    Patient Goals:Self-Care Activities: -Continue administering medications as prescribed -Attend all scheduled provider appointments -Continue monitoring and recording blood pressure and notify provider for readings outside of the established range. -Continue adherence to recommended diet -Contact provider  or care management team with questions and new concerns as  needed   Follow Up Plan: Will follow up next month     Mrs. Demeritt verbalized understanding of the information discussed during the telephonic outreach. Declined need for mailed/printed instructions. A member of the care management team will follow up next month.  [facility] Care Management [facility] [phone]

## 2020-11-10 NOTE — Chronic Care Management (AMB) (Signed)
Chronic Care Management   CCM RN Visit Note   Name: Jessica Wheeler MRN: [id number] DOB: 10/10/1940  Subjective: Jessica Wheeler is a 80 y.o. year old female who is a primary care patient of Bacigalupo, [name], MD. The care management team was consulted for assistance with disease management and care coordination needs.    Engaged with patient by telephone for follow up visit in response to provider referral for case management and/or care coordination services.   Consent to Services:  The patient was given information about Chronic Care Management services, agreed to services, and gave verbal consent prior to initiation of services.  Please see initial visit note for detailed documentation.   Assessment: Review of patient past medical history, allergies, medications, health status, including review of consultants reports, laboratory and other test data, was performed as part of comprehensive evaluation and provision of chronic care management services.   SDOH (Social Determinants of Health) assessments and interventions performed:    CCM Care Plan  Allergies  Allergen Reactions   Beta Adrenergic Blockers     Chest pressure/difficulty breathing   Jardiance [Empagliflozin]     Muscle cramps   Levaquin [Levofloxacin In D5w]     Low blood sugar; does take this when she needs it. She just makes sure to control her BS to avoid hypoglycemia.   Lisinopril Cough   Zetia [Ezetimibe]     Lethargic    Prevnar [Pneumococcal 13-Val Conj Vacc] Rash    Joint Pain   Sulfa Antibiotics Rash    "makes my skin raw"    Outpatient Encounter Medications as of 11/10/2020  Medication Sig   Accu-Chek FastClix Lancets MISC 1 strip by Does not apply route daily.   amiloride-hydrochlorothiazide (MODURETIC) 5-50 MG tablet TAKE 1/2 TABLET EVERY DAY   amLODipine (NORVASC) 5 MG tablet TAKE 1 TABLET EVERY DAY   aspirin 81 MG EC tablet Take 81 mg by mouth daily.   Blood Glucose Monitoring Suppl (ACCU-CHEK  GUIDE) w/Device KIT 1 each by Does not apply route as directed.   Cyanocobalamin (VITAMIN B-12) 1000 MCG/15ML LIQD Take 1,000 mcg by mouth daily.    cyclobenzaprine (FLEXERIL) 5 MG tablet Take 1 tablet (5 mg total) by mouth 3 (three) times daily as needed for muscle spasms.   glucose blood (ACCU-CHEK GUIDE) test strip Use as instructed   ibuprofen (ADVIL) 200 MG tablet Take 200 mg by mouth daily at 6 (six) AM.   Lancets Misc. (ACCU-CHEK FASTCLIX LANCET) KIT 1 each by Does not apply route as directed.   latanoprost (XALATAN) 0.005 % ophthalmic solution Place 1 drop into both eyes at bedtime.    losartan (COZAAR) 100 MG tablet TAKE 1 TABLET EVERY DAY   Magnesium 250 MG TABS Take 250 mg by mouth daily.   metFORMIN (GLUCOPHAGE) 500 MG tablet TAKE 2 TABLETS TWICE DAILY WITH MEALS   No facility-administered encounter medications on file as of 11/10/2020.    Patient Active Problem List   Diagnosis Date Noted   Cervical spondylosis with myelopathy and radiculopathy 04/01/2020   Lumbar adjacent segment disease with spondylolisthesis 03/30/2020   Overactive bladder 01/17/2020   Overweight 07/04/2019   Hyperlipidemia associated with type 2 diabetes mellitus ([place]) 03/15/2019   Fatigue 12/11/2017   Primary osteoarthritis of left knee 03/10/2016   Depression 12/28/2015   Allergic rhinitis 01/31/2015   Benign paroxysmal positional nystagmus 01/31/2015   Bladder infection, chronic 01/31/2015   Glaucoma 01/31/2015   Chronic hyponatremia 01/31/2015   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 01/31/2015   Anxiety 11/04/2014   Fibrocystic breast disease 04/30/2013   Renal artery stenosis (HCC) 04/18/2012   Chest pain 10/10/2011   Murmur 10/10/2011   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[place]) 03/31/2011   Hypertension associated with diabetes ([place]) 08/03/2010   CAD (coronary artery disease) 08/03/2010   DM (diabetes mellitus) ([place]) 08/03/2010    Conditions to be addressed/monitored:HTN and DMII  Care  Plan : Diabetes Type 2 (Adult)       Problem: Disease Progression (Diabetes, Type 2)      Long-Range Goal: Disease Progression Prevented or Minimized   Start Date: 08/31/2020  Expected End Date: 12/29/2020  Priority: High  Note:   Current Barriers:  Chronic disease management and support related to Diabetes self-management  Case Manager Clinical Goal(s):  Over the next 120 days, patient will demonstrate continued adherence to prescribed treatment plan for Diabetes self management as evidenced by daily monitoring and recording of CBG, adherence to ADA/ carb modified diet and adherence to prescribed medication regimen.  Interventions:  Collaboration with [name] [name], MD regarding development and update of comprehensive plan of care as evidenced by provider attestation and co-signature Inter-disciplinary care team collaboration (see longitudinal plan of care) Reviewed blood glucose readings and compliance with treatment plan. She was evaluated by [facility] d/t severe lower back pain. Prednisone was prescribed. Recalls fasting blood glucose levels were elevated with readings in the 200's immediately after starting prednisone. Reports readings in the past few days have ranged in the low to mid 140's. She is very knowledgeable of s/sx of hypoglycemia and hyperglycemia along with appropriate interventions. A1C is currently not at goal. Reports monitoring her levels consistently and complying a with a diabetic diet. Remains compliant with DM preventive care exams.    Patient Goals/Self-Care Activities -Continue administering medications as prescribed -Attend all scheduled provider appointments -Monitor blood glucose levels consistently and utilize recommended interventions -Continue adherence to prescribed ADA/carb modified -Notify provider or care management team with questions and new concerns as needed.   Follow Up Plan:  Will follow up next month        Care Plan :  Hypertension (Adult)       Problem: Hypertension (Hypertension)      Long-Range Goal: Hypertension Monitored   Start Date: 08/31/2020  Expected End Date: 12/29/2020  Priority: High  Note:   Objective:  BP Readings from Last 3 Encounters:  09/10/20 (!) 152/65  07/21/20 (!) 146/72  04/03/20 (!) 164/69  Current Barriers:  Chronic Disease Management needs and support related Hypertension management.  Case Manager Clinical Goal(s):  Over the next 120 days, patient will demonstrate adherence to prescribed treatment plan for hypertension as evidenced by taking all medications as prescribed, monitoring and recording blood pressure, and adhering to a cardiac prudent/heart healthy diet.  Interventions:  Collaboration with [name] [name], MD regarding development and update of comprehensive plan of care as evidenced by provider attestation and co-signature Inter-disciplinary care team collaboration (see longitudinal plan of care) Reviewed medications and home BP readings. Clinic readings have been elevated but reports home readings have been within range. Reports systolic readings have ranged in the 120's. Diastolic readings have ranged in the 50's and 60's. Her activity has declined d/t recent episodes of severe back pain but she continues to walk daily. Reports doing very well with maintaining a heart healthy diet.  Reviewed s/sx of heart attack, stroke and  worsening symptoms that require immediate medical attention.    Patient Goals:Self-Care Activities: -Continue  administering medications as prescribed -Attend all scheduled provider appointments -Continue monitoring and recording blood pressure and notify provider for readings outside of the established range. -Continue adherence to recommended diet -Contact provider or care management team with questions and new concerns as needed   Follow Up Plan: Will follow up next month     PLAN A member of the care management team will  follow up with Mrs. Thalmann next month.    [facility] Care Management [facility] [phone]

## 2020-11-16 DIAGNOSIS — G5603 Carpal tunnel syndrome, bilateral upper limbs: Secondary | ICD-10-CM | POA: Diagnosis not present

## 2020-11-20 ENCOUNTER — Ambulatory Visit: Payer: Self-pay

## 2020-11-20 DIAGNOSIS — E1169 Type 2 diabetes mellitus with other specified complication: Secondary | ICD-10-CM | POA: Diagnosis not present

## 2020-11-20 DIAGNOSIS — I152 Hypertension secondary to endocrine disorders: Secondary | ICD-10-CM | POA: Diagnosis not present

## 2020-11-20 DIAGNOSIS — E1159 Type 2 diabetes mellitus with other circulatory complications: Secondary | ICD-10-CM | POA: Diagnosis not present

## 2020-11-20 NOTE — Chronic Care Management (AMB) (Signed)
Chronic Care Management   CCM RN Visit Note  11/20/2020 Name: Jessica Wheeler MRN: [id number] DOB: [birth year]  Subjective: Jessica Wheeler is a 80 y.o. year old female who is a primary care patient of Bacigalupo, [name], MD. The care management team was consulted for assistance with disease management and care coordination needs.    Engaged with patient by telephone for follow up visit in response to provider referral for case management and/or care coordination services.   Consent to Services:  The patient was given information about Chronic Care Management services, agreed to services, and gave verbal consent prior to initiation of services.  Please see initial visit note for detailed documentation.   Patient agreed to services and verbal consent obtained.   Assessment: Review of patient past medical history, allergies, medications, health status, including review of consultants reports, laboratory and other test data, was performed as part of comprehensive evaluation and provision of chronic care management services.   SDOH (Social Determinants of Health) assessments and interventions performed:    CCM Care Plan  Allergies  Allergen Reactions   Beta Adrenergic Blockers     Chest pressure/difficulty breathing   Jardiance [Empagliflozin]     Muscle cramps   Levaquin [Levofloxacin In D5w]     Low blood sugar; does take this when she needs it. She just makes sure to control her BS to avoid hypoglycemia.   Lisinopril Cough   Zetia [Ezetimibe]     Lethargic    Prevnar [Pneumococcal 13-Val Conj Vacc] Rash    Joint Pain   Sulfa Antibiotics Rash    "makes my skin raw"    Outpatient Encounter Medications as of 11/20/2020  Medication Sig   Accu-Chek FastClix Lancets MISC 1 strip by Does not apply route daily.   amiloride-hydrochlorothiazide (MODURETIC) 5-50 MG tablet TAKE 1/2 TABLET EVERY DAY   amLODipine (NORVASC) 5 MG tablet TAKE 1 TABLET EVERY DAY   aspirin 81 MG EC tablet Take  81 mg by mouth daily.   Blood Glucose Monitoring Suppl (ACCU-CHEK GUIDE) w/Device KIT 1 each by Does not apply route as directed.   Cyanocobalamin (VITAMIN B-12) 1000 MCG/15ML LIQD Take 1,000 mcg by mouth daily.    cyclobenzaprine (FLEXERIL) 5 MG tablet Take 1 tablet (5 mg total) by mouth 3 (three) times daily as needed for muscle spasms.   glucose blood (ACCU-CHEK GUIDE) test strip Use as instructed   ibuprofen (ADVIL) 200 MG tablet Take 200 mg by mouth daily at 6 (six) AM.   Lancets Misc. (ACCU-CHEK FASTCLIX LANCET) KIT 1 each by Does not apply route as directed.   latanoprost (XALATAN) 0.005 % ophthalmic solution Place 1 drop into both eyes at bedtime.    losartan (COZAAR) 100 MG tablet TAKE 1 TABLET EVERY DAY   Magnesium 250 MG TABS Take 250 mg by mouth daily.   metFORMIN (GLUCOPHAGE) 500 MG tablet TAKE 2 TABLETS TWICE DAILY WITH MEALS   No facility-administered encounter medications on file as of 11/20/2020.    Patient Active Problem List   Diagnosis Date Noted   Cervical spondylosis with myelopathy and radiculopathy 04/01/2020   Lumbar adjacent segment disease with spondylolisthesis 03/30/2020   Overactive bladder 01/17/2020   Overweight 07/04/2019   Hyperlipidemia associated with type 2 diabetes mellitus ([place]) 03/15/2019   Fatigue 12/11/2017   Primary osteoarthritis of left knee 03/10/2016   Depression 12/28/2015   Allergic rhinitis 01/31/2015   Benign paroxysmal positional nystagmus 01/31/2015   Bladder infection, chronic 01/31/2015   Glaucoma 01/31/2015  Chronic hyponatremia 01/31/2015   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 01/31/2015   Anxiety 11/04/2014   Fibrocystic breast disease 04/30/2013   Renal artery stenosis (HCC) 04/18/2012   Chest pain 10/10/2011   Murmur 10/10/2011   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[place]) 03/31/2011   Hypertension associated with diabetes ([place]) 08/03/2010   CAD (coronary artery disease) 08/03/2010   DM (diabetes mellitus) ([place])  08/03/2010    Conditions to be addressed/monitored:DMII and Joint Pain Patient Care Plan: Diabetes Type 2 (Adult)     Problem Identified: Disease Progression (Diabetes, Type 2)      Long-Range Goal: Disease Progression Prevented or Minimized   Start Date: 08/31/2020  Expected End Date: 12/29/2020  Priority: High  Note:   Current Barriers:  Chronic disease management and support related to Diabetes self-management  Case Manager Clinical Goal(s):  Over the next 120 days, patient will demonstrate continued adherence to prescribed treatment plan for Diabetes self management as evidenced by daily monitoring and recording of CBG, adherence to ADA/ carb modified diet and adherence to prescribed medication regimen.  Interventions:  Collaboration with [name] [name], MD regarding development and update of comprehensive plan of care as evidenced by provider attestation and co-signature Inter-disciplinary care team collaboration (see longitudinal plan of care) Reviewed blood glucose readings and compliance with treatment plan. She was evaluated by [facility] d/t severe lower back pain. Prednisone was prescribed. Recalls fasting blood glucose levels were elevated with readings in the 200's immediately after starting prednisone. Reports readings in the past few days have ranged in the low to mid 140's. She is very knowledgeable of s/sx of hypoglycemia and hyperglycemia along with appropriate interventions. A1C is currently not at goal. Reports monitoring her levels consistently and complying a with a diabetic diet. Remains compliant with DM preventive care exams. Update: Pending confirmation from the Neurology team regarding plan. Reports requiring prednisone to assist with symptoms r/t to joint pain and suspected nerve damage. Her blood glucose readings have been elevated since taking prednisone. Pending feedback regarding plan  for MRI. Imaging pending approval from her insurance provider. She is  aware of possible need for medication adjustments to maintain optimal glycemic control if her blood glucose readings remain elevated.    Patient Goals/Self-Care Activities -Continue administering medications as prescribed -Attend all scheduled provider appointments -Monitor blood glucose levels consistently and utilize recommended interventions -Continue adherence to prescribed ADA/carb modified -Notify provider or care management team with questions and new concerns as needed.   Follow Up Plan:  Will follow up next month         PLAN A member of the care management team will follow up with Mrs. Riojas next month.    [facility] Care [facility] [phone]

## 2020-11-23 ENCOUNTER — Other Ambulatory Visit: Payer: Self-pay | Admitting: Neurosurgery

## 2020-11-23 DIAGNOSIS — M4316 Spondylolisthesis, lumbar region: Secondary | ICD-10-CM

## 2020-11-26 ENCOUNTER — Ambulatory Visit
Admission: RE | Admit: 2020-11-26 | Discharge: 2020-11-26 | Disposition: A | Discharge status: Home / Self Care | Payer: Medicare HMO | Source: Ambulatory Visit | Attending: Neurosurgery | Admitting: Neurosurgery

## 2020-11-26 ENCOUNTER — Other Ambulatory Visit: Payer: Self-pay

## 2020-11-26 DIAGNOSIS — M4697 Unspecified inflammatory spondylopathy, lumbosacral region: Secondary | ICD-10-CM | POA: Diagnosis not present

## 2020-11-26 DIAGNOSIS — M48061 Spinal stenosis, lumbar region without neurogenic claudication: Secondary | ICD-10-CM | POA: Diagnosis not present

## 2020-11-26 DIAGNOSIS — M5126 Other intervertebral disc displacement, lumbar region: Secondary | ICD-10-CM | POA: Diagnosis not present

## 2020-11-26 DIAGNOSIS — M4316 Spondylolisthesis, lumbar region: Secondary | ICD-10-CM | POA: Insufficient documentation

## 2020-11-27 DIAGNOSIS — M4316 Spondylolisthesis, lumbar region: Secondary | ICD-10-CM | POA: Diagnosis not present

## 2020-11-27 DIAGNOSIS — G5603 Carpal tunnel syndrome, bilateral upper limbs: Secondary | ICD-10-CM | POA: Diagnosis not present

## 2020-11-27 DIAGNOSIS — M48062 Spinal stenosis, lumbar region with neurogenic claudication: Secondary | ICD-10-CM | POA: Diagnosis not present

## 2020-12-10 NOTE — Patient Instructions (Addendum)
Thank you for allowing the Chronic Care Management team to participate in your care.   Patient Care Plan: Diabetes Type 2 (Adult)     Problem Identified: Disease Progression (Diabetes, Type 2)      Long-Range Goal: Disease Progression Prevented or Minimized   Start Date: 08/31/2020  Expected End Date: 12/29/2020  Priority: High  Note:   Current Barriers:  Chronic disease management and support related to Diabetes self-management  Case Manager Clinical Goal(s):  Over the next 120 days, patient will demonstrate continued adherence to prescribed treatment plan for Diabetes self management as evidenced by daily monitoring and recording of CBG, adherence to ADA/ carb modified diet and adherence to prescribed medication regimen.  Interventions:  Collaboration with Beryle Flock Marzella Schlein, MD regarding development and update of comprehensive plan of care as evidenced by provider attestation and co-signature Inter-disciplinary care team collaboration (see longitudinal plan of care) Reviewed blood glucose readings and compliance with treatment plan. She was evaluated by [facility] d/t severe lower back pain. Prednisone was prescribed. Recalls fasting blood glucose levels were elevated with readings in the 200's immediately after starting prednisone. Reports readings in the past few days have ranged in the low to mid 140's. She is very knowledgeable of s/sx of hypoglycemia and hyperglycemia along with appropriate interventions. A1C is currently not at goal. Reports monitoring her levels consistently and complying a with a diabetic diet. Remains compliant with DM preventive care exams. Update: Pending confirmation from the Neurology team regarding plan. Reports requiring prednisone to assist with symptoms r/t to joint pain and suspected nerve damage. Her blood glucose readings have been elevated since taking prednisone. Pending feedback regarding plan  for MRI. Imaging pending approval from her insurance  provider. She is aware of possible need for medication adjustments to maintain optimal glycemic control if her blood glucose readings remain elevated.    Patient Goals/Self-Care Activities -Continue administering medications as prescribed -Attend all scheduled provider appointments -Monitor blood glucose levels consistently and utilize recommended interventions -Continue adherence to prescribed ADA/carb modified -Notify provider or care management team with questions and new concerns as needed.   Follow Up Plan:  Will follow up next month         Care Coordination r/t DM and pending MRI. A member of the care management team will follow up with Mrs. Kissel next month.   [facility] Care [facility] [phone]

## 2020-12-11 ENCOUNTER — Other Ambulatory Visit: Payer: Self-pay | Admitting: Family Medicine

## 2020-12-11 DIAGNOSIS — E1169 Type 2 diabetes mellitus with other specified complication: Secondary | ICD-10-CM

## 2020-12-21 DIAGNOSIS — N390 Urinary tract infection, site not specified: Secondary | ICD-10-CM | POA: Diagnosis not present

## 2020-12-21 DIAGNOSIS — Z20822 Contact with and (suspected) exposure to covid-19: Secondary | ICD-10-CM | POA: Diagnosis not present

## 2020-12-21 DIAGNOSIS — R509 Fever, unspecified: Secondary | ICD-10-CM | POA: Diagnosis not present

## 2020-12-22 ENCOUNTER — Ambulatory Visit: Payer: Self-pay | Admitting: *Deleted

## 2020-12-22 ENCOUNTER — Other Ambulatory Visit: Payer: Self-pay

## 2020-12-22 ENCOUNTER — Ambulatory Visit
Admission: RE | Admit: 2020-12-22 | Discharge: 2020-12-22 | Disposition: A | Discharge status: Home / Self Care | Payer: Medicare HMO | Attending: Family Medicine | Admitting: Family Medicine

## 2020-12-22 ENCOUNTER — Ambulatory Visit
Admission: RE | Admit: 2020-12-22 | Discharge: 2020-12-22 | Disposition: A | Discharge status: Home / Self Care | Payer: Medicare HMO | Source: Ambulatory Visit | Attending: Family Medicine | Admitting: Family Medicine

## 2020-12-22 ENCOUNTER — Ambulatory Visit (INDEPENDENT_AMBULATORY_CARE_PROVIDER_SITE_OTHER): Payer: Medicare HMO | Admitting: Family Medicine

## 2020-12-22 ENCOUNTER — Encounter: Payer: Self-pay | Admitting: Family Medicine

## 2020-12-22 VITALS — BP 138/66 | HR 97 | Temp 97.7°F | Resp 18

## 2020-12-22 DIAGNOSIS — R059 Cough, unspecified: Secondary | ICD-10-CM | POA: Insufficient documentation

## 2020-12-22 DIAGNOSIS — R918 Other nonspecific abnormal finding of lung field: Secondary | ICD-10-CM | POA: Diagnosis not present

## 2020-12-22 MED ORDER — DOXYCYCLINE HYCLATE 100 MG PO TABS: 100.0000 mg | ORAL_TABLET | Freq: Two times a day (BID) | ORAL | 0 refills | Status: AC

## 2020-12-22 NOTE — Progress Notes (Signed)
Established patient visit   Patient: Jessica Wheeler   DOB: [birth year]   80 y.o. Female  MRN: [id number] Visit Date: 12/22/2020  Today's healthcare provider: Lelon Huh, MD   Chief Complaint  Patient presents with   Cough   Subjective  -------------------------------------------------------------------------------------------------------------------- Cough This is a new problem. Episode onset: 2 days ago. The problem has been gradually worsening. The cough is Productive of sputum (thick yellow sputum). Associated symptoms include a fever (up to 101.4), headaches, myalgias and shortness of breath. Pertinent negatives include no chest pain, chills, ear congestion, ear pain or sore throat. Treatments tried: Tylenol and Advil. The treatment provided mild relief.   Patient states she had a negative COVID test yesterday done by [facility] urgent care.     Medications: Outpatient Medications Prior to Visit  Medication Sig   Accu-Chek FastClix Lancets MISC 1 strip by Does not apply route daily.   amiloride-hydrochlorothiazide (MODURETIC) 5-50 MG tablet TAKE 1/2 TABLET EVERY DAY   amLODipine (NORVASC) 5 MG tablet TAKE 1 TABLET EVERY DAY   aspirin 81 MG EC tablet Take 81 mg by mouth daily.   Blood Glucose Monitoring Suppl (ACCU-CHEK GUIDE) w/Device KIT USE AS DIRECTED TO CHECK FASTING BLOOD SUGAR DAILY   Cyanocobalamin (VITAMIN B-12) 1000 MCG/15ML LIQD Take 1,000 mcg by mouth daily.    cyclobenzaprine (FLEXERIL) 5 MG tablet Take 1 tablet (5 mg total) by mouth 3 (three) times daily as needed for muscle spasms.   glucose blood (ACCU-CHEK GUIDE) test strip Use as instructed   ibuprofen (ADVIL) 200 MG tablet Take 200 mg by mouth daily at 6 (six) AM.   Lancets Misc. (ACCU-CHEK FASTCLIX LANCET) KIT 1 each by Does not apply route as directed.   latanoprost (XALATAN) 0.005 % ophthalmic solution Place 1 drop into both eyes at bedtime.    losartan (COZAAR) 100 MG tablet TAKE 1 TABLET EVERY DAY    Magnesium 250 MG TABS Take 250 mg by mouth daily.   metFORMIN (GLUCOPHAGE) 500 MG tablet TAKE 2 TABLETS TWICE DAILY WITH MEALS   No facility-administered medications prior to visit.    Review of Systems  Constitutional:  Positive for fever (up to 101.4). Negative for appetite change, chills and fatigue.  HENT:  Positive for congestion (chest congestion). Negative for ear pain and sore throat.   Respiratory:  Positive for cough (productive with yellow sputum) and shortness of breath. Negative for chest tightness.   Cardiovascular:  Negative for chest pain and palpitations.  Gastrointestinal:  Negative for abdominal pain, nausea and vomiting.  Musculoskeletal:  Positive for myalgias.  Neurological:  Positive for headaches. Negative for dizziness and weakness.      Objective  -------------------------------------------------------------------------------------------------------------------- BP 138/66 (BP Location: Left Arm, Patient Position: Sitting, Cuff Size: Normal)   Pulse 97   Temp 97.7 F (36.5 C) (Temporal)   Resp 18   SpO2 96%     Physical Exam   General: Appearance:     Well developed, well nourished female, well appearing, in no acute distress  Eyes:    PERRL, conjunctiva/corneas clear, EOM's intact       Lungs:     Clear to auscultation bilaterally, respirations unlabored  Heart:    Normal heart rate. Normal rhythm.     MS:   All extremities are intact.    Neurologic:   Awake, alert, oriented x 3. No apparent focal neurological defect.        Assessment & Plan  ---------------------------------------------------------------------------------------------------------------------- 1. Cough  -  DG Chest 2 View; Future - doxycycline (VIBRA-TABS) 100 MG tablet; Take 1 tablet (100 mg total) by mouth 2 (two) times daily for 10 days.  Dispense: 20 tablet; Refill: 0  Recommend she take another covid test today and let me know if positive.        The entirety of the  information documented in the History of Present Illness, Review of Systems and Physical Exam were personally obtained by me. Portions of this information were initially documented by the CMA and reviewed by me for thoroughness and accuracy.     Donald Fisher, MD  [facility] [phone] (phone) [phone] (fax)  [facility]  

## 2020-12-22 NOTE — Telephone Encounter (Signed)
FYI

## 2020-12-22 NOTE — Telephone Encounter (Signed)
I returned pt's call.   She had called in c/o having a fever since Sunday.   It was 101.   She is feeling heaviness in her chest with coughing.   Coughed up yellow mucus with red specks in it this morning.    She is c/o chills and body aches.   No appetite.   Denies shortness of breath, diarrhea or vomiting.   She did a Covid test yesterday which was negative.   She is also c/o having some nasal congestion.  No sore throat.    I'm sending a message to [facility] to see if she can be worked in.  Pt was agreeable to this and someone calling her back.    I sent my notes to [facility] for a possible work in.

## 2020-12-22 NOTE — Telephone Encounter (Signed)
Reason for Disposition  Fever present > 3 days (72 hours)    Fever since Sunday  coughing  Answer Assessment - Initial Assessment Questions 1. TEMPERATURE: "What is the most recent temperature?"  "How was it measured?"      Fever since Sunday.  101.   Heaviness in chest and coughing.   I coughed up some mucus this mucus.    2. ONSET: "When did the fever start?"      Sunday   Mucus yellow with some red blood. 3. CHILLS: "Do you have chills?" If yes: "How bad are they?"  (e.g., none, mild, moderate, severe)   - NONE: no chills   - MILD: feeling cold   - MODERATE: feeling very cold, some shivering (feels better under a thick blanket)   - SEVERE: feeling extremely cold with shaking chills (general body shaking, rigors; even under a thick blanket)      Chills and body aches.   No diarrhea or vomiting.   Shortness of breath.   4. OTHER SYMPTOMS: "Do you have any other symptoms besides the fever?"  (e.g., abdomen pain, cough, diarrhea, earache, headache, sore throat, urination pain)     Covid test negative yesterday.  I don't have a sore throat.   A little stuffy nose.  5. CAUSE: If there are no symptoms, ask: "What do you think is causing the fever?"      Bronchitis or pneumonia.     6. CONTACTS: "Does anyone else in the family have an infection?"     No 7. TREATMENT: "What have you done so far to treat this fever?" (e.g., medications)     I'm using Advil and Tylenol to keep the fever down.    I started the Cipro this morning.  I had some left over before.   8. IMMUNOCOMPROMISE: "Do you have of the following: diabetes, HIV positive, splenectomy, cancer chemotherapy, chronic steroid treatment, transplant patient, etc."     I have diabetes and hypertension   9. PREGNANCY: "Is there any chance you are pregnant?" "When was your last menstrual period?"     N/A due to age 80. TRAVEL: "Have you traveled out of the country in the last month?" (e.g., travel history, exposures)       No  Protocols  used: [facility]

## 2020-12-22 NOTE — Patient Instructions (Addendum)
Go to the [facility] on [facility] for chest Xray   Take OTC Mucinex to help break up cough  I recommend taking one more Covid test and let me know if it is positive.

## 2020-12-23 DIAGNOSIS — Z20822 Contact with and (suspected) exposure to covid-19: Secondary | ICD-10-CM | POA: Diagnosis not present

## 2021-01-25 DIAGNOSIS — H43813 Vitreous degeneration, bilateral: Secondary | ICD-10-CM | POA: Diagnosis not present

## 2021-01-25 DIAGNOSIS — Z961 Presence of intraocular lens: Secondary | ICD-10-CM | POA: Diagnosis not present

## 2021-01-25 DIAGNOSIS — H353132 Nonexudative age-related macular degeneration, bilateral, intermediate dry stage: Secondary | ICD-10-CM | POA: Diagnosis not present

## 2021-01-25 DIAGNOSIS — H401131 Primary open-angle glaucoma, bilateral, mild stage: Secondary | ICD-10-CM | POA: Diagnosis not present

## 2021-01-25 DIAGNOSIS — E119 Type 2 diabetes mellitus without complications: Secondary | ICD-10-CM | POA: Diagnosis not present

## 2021-01-25 DIAGNOSIS — Z7984 Long term (current) use of oral hypoglycemic drugs: Secondary | ICD-10-CM | POA: Diagnosis not present

## 2021-01-25 LAB — HM DIABETES EYE EXAM

## 2021-01-25 NOTE — Telephone Encounter (Signed)
error 

## 2021-01-29 ENCOUNTER — Ambulatory Visit: Payer: PPO | Admitting: Cardiovascular Disease

## 2021-01-29 ENCOUNTER — Encounter: Payer: Self-pay | Admitting: Family Medicine

## 2021-01-29 ENCOUNTER — Other Ambulatory Visit: Payer: Self-pay

## 2021-01-29 ENCOUNTER — Ambulatory Visit (INDEPENDENT_AMBULATORY_CARE_PROVIDER_SITE_OTHER): Payer: Medicare HMO | Admitting: Family Medicine

## 2021-01-29 VITALS — BP 143/56 | HR 79 | Temp 98.3°F | Resp 16 | Wt 141.0 lb

## 2021-01-29 DIAGNOSIS — E1169 Type 2 diabetes mellitus with other specified complication: Secondary | ICD-10-CM | POA: Diagnosis not present

## 2021-01-29 DIAGNOSIS — Z23 Encounter for immunization: Secondary | ICD-10-CM | POA: Diagnosis not present

## 2021-01-29 DIAGNOSIS — E785 Hyperlipidemia, unspecified: Secondary | ICD-10-CM

## 2021-01-29 DIAGNOSIS — E1159 Type 2 diabetes mellitus with other circulatory complications: Secondary | ICD-10-CM | POA: Diagnosis not present

## 2021-01-29 DIAGNOSIS — T466X5A Adverse effect of antihyperlipidemic and antiarteriosclerotic drugs, initial encounter: Secondary | ICD-10-CM | POA: Diagnosis not present

## 2021-01-29 DIAGNOSIS — M791 Myalgia, unspecified site: Secondary | ICD-10-CM

## 2021-01-29 DIAGNOSIS — I152 Hypertension secondary to endocrine disorders: Secondary | ICD-10-CM

## 2021-01-29 LAB — POCT GLYCOSYLATED HEMOGLOBIN (HGB A1C): Hemoglobin A1C: 6.6 % — AB (ref 4.0–5.6)

## 2021-01-29 MED ORDER — HYDROCHLOROTHIAZIDE 25 MG PO TABS: 25.0000 mg | ORAL_TABLET | Freq: Every day | ORAL | 3 refills | Status: DC

## 2021-01-29 NOTE — Assessment & Plan Note (Signed)
Well controlled Continue current meds UTD on screenings On [facility] Not on statin as below

## 2021-01-29 NOTE — Progress Notes (Signed)
Established patient visit   Patient: Jessica Wheeler   DOB: 12/14/1940   80 y.o. Female  MRN: [id number] Visit Date: 01/29/2021  Today's healthcare provider: Lavon Paganini, MD   Chief Complaint  Patient presents with   Follow-up   Diabetes   Hypertension   Subjective    Diabetes Pertinent negatives for hypoglycemia include no dizziness or headaches. Pertinent negatives for diabetes include no chest pain, no fatigue and no weakness.  Hypertension Pertinent negatives include no chest pain, headaches, neck pain, palpitations or shortness of breath.   Diabetes Mellitus Type II, follow-up  Lab Results  Component Value Date   HGBA1C 6.6 (A) 01/29/2021   HGBA1C 7.4 (H) 07/23/2020   HGBA1C 6.8 (H) 03/30/2020   Last seen for diabetes 6 months ago.  Management since then includes continuing the same treatment.  She reports good compliance with treatment. She is not having side effects. none  Home blood sugar records: fasting range: 140  Episodes of hypoglycemia? No none   Current insulin regiment: n/a Most Recent Eye Exam: 01/05/2021  --------------------------------------------------------------------------------------------------- Hypertension, follow-up  BP Readings from Last 3 Encounters:  01/29/21 (!) 143/56  12/22/20 138/66  09/10/20 (!) 152/65   Wt Readings from Last 3 Encounters:  01/29/21 141 lb (64 kg)  09/10/20 143 lb (64.9 kg)  07/21/20 142 lb (64.4 kg)     She was last seen for hypertension 6 months ago.  BP at that visit was 146/72.  Management since that visit includes; continue current medications.  Sometimes the amiloride HCTZ is causing cramping. She was taking a 1/4 to 1/2 pill per day.   She reports good compliance with treatment. She is not having side effects. none She is exercising. She is adherent to low salt diet.   Outside blood pressures are not checking.  She does not smoke.  Use of agents associated with hypertension:  none.   Numbness/Tingling in hands She reports the only issue is the numbness/tingling in her hands. She has difficulty gripping things. She reports she is scheduled to follow with Dr. Arnoldo Morale for surgery but she is putting it off.   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 When these occur she goes to urgent care.  ---------------------------------------------------------------------------------------------------     Medications: Outpatient Medications Prior to Visit  Medication Sig   Accu-Chek FastClix Lancets MISC 1 strip by Does not apply route daily.   amLODipine (NORVASC) 5 MG tablet TAKE 1 TABLET EVERY DAY   aspirin 81 MG EC tablet Take 81 mg by mouth daily.   Blood Glucose Monitoring Suppl (ACCU-CHEK GUIDE) w/Device KIT USE AS DIRECTED TO CHECK FASTING BLOOD SUGAR DAILY   Cyanocobalamin (VITAMIN B-12) 1000 MCG/15ML LIQD Take 1,000 mcg by mouth daily.    glucose blood (ACCU-CHEK GUIDE) test strip Use as instructed   ibuprofen (ADVIL) 200 MG tablet Take 200 mg by mouth daily at 6 (six) AM.   Lancets Misc. (ACCU-CHEK FASTCLIX LANCET) KIT 1 each by Does not apply route as directed.   latanoprost (XALATAN) 0.005 % ophthalmic solution Place 1 drop into both eyes at bedtime.    losartan (COZAAR) 100 MG tablet TAKE 1 TABLET EVERY DAY   Magnesium 250 MG TABS Take 250 mg by mouth daily.   metFORMIN (GLUCOPHAGE) 500 MG tablet TAKE 2 TABLETS TWICE DAILY WITH MEALS   [DISCONTINUED] amiloride-hydrochlorothiazide (MODURETIC) 5-50 MG tablet TAKE 1/2 TABLET  EVERY DAY   No facility-administered medications prior to visit.    Review of Systems  Constitutional:  Negative for chills, diaphoresis, fatigue and fever.  HENT:  Negative for ear pain, sinus pressure, sinus pain and sore throat.   Eyes:  Negative for pain and visual disturbance.  Respiratory:  Negative for cough, chest tightness, shortness of  breath and wheezing.   Cardiovascular:  Negative for chest pain, palpitations and leg swelling.  Gastrointestinal:  Negative for abdominal pain, diarrhea, nausea and vomiting.  Genitourinary:  Negative for flank pain, frequency, pelvic pain and urgency.  Musculoskeletal:  Negative for back pain, myalgias and neck pain.  Neurological:  Negative for dizziness, weakness, light-headedness, numbness and headaches.      Objective    BP (!) 143/56 (BP Location: Right Arm, Patient Position: Sitting, Cuff Size: Normal)   Pulse 79   Temp 98.3 F (36.8 C) (Oral)   Resp 16   Wt 141 lb (64 kg)   SpO2 99%   BMI 24.98 kg/m  {Show previous vital signs (optional):23777}  Physical Exam Vitals reviewed.  Constitutional:      General: She is not in acute distress.    Appearance: Normal appearance. She is well-developed. She is not diaphoretic.  HENT:     Head: Normocephalic and atraumatic.     Right Ear: Tympanic membrane, ear canal and external ear normal.     Left Ear: Tympanic membrane, ear canal and external ear normal.     Nose: Nose normal.     Mouth/Throat:     Mouth: Mucous membranes are moist.     Pharynx: Oropharynx is clear. No oropharyngeal exudate.  Eyes:     General: No scleral icterus.    Conjunctiva/sclera: Conjunctivae normal.     Pupils: Pupils are equal, round, and reactive to light.  Neck:     Thyroid: No thyromegaly.  Cardiovascular:     Rate and Rhythm: Normal rate and regular rhythm.     Pulses: Normal pulses.     Heart sounds: Murmur heard.  Pulmonary:     Effort: Pulmonary effort is normal. No respiratory distress.     Breath sounds: Normal breath sounds. No wheezing, rhonchi or rales.  Abdominal:     General: There is no distension.     Palpations: Abdomen is soft.     Tenderness: There is no abdominal tenderness.  Musculoskeletal:        General: No deformity.     Cervical back: Neck supple.     Right lower leg: No edema.     Left lower leg: No edema.   Lymphadenopathy:     Cervical: No cervical adenopathy.  Skin:    General: Skin is warm and dry.     Findings: No rash.  Neurological:     Mental Status: She is alert and oriented to person, place, and time. Mental status is at baseline.     Sensory: No sensory deficit.     Motor: No weakness.     Gait: Gait normal.  Psychiatric:        Mood and Affect: Mood normal.        Behavior: Behavior normal.        Thought Content: Thought content normal.      Results for orders placed or performed in visit on 01/29/21  POCT glycosylated hemoglobin (Hb A1C)  Result Value Ref Range   Hemoglobin A1C 6.6 (A) 4.0 - 5.6 %    Assessment & Plan  Problem List Items Addressed This Visit       Cardiovascular and Mediastinum   Hypertension associated with diabetes ([place])    Well controlled Continue current medications, except switch monoduretic to HCTZ 108m daily) Recheck metabolic panel F/u in 6 months       Relevant Medications   hydrochlorothiazide (HYDRODIURIL) 25 MG tablet   Other Relevant Orders   Comprehensive metabolic panel     Endocrine   DM (diabetes mellitus) ([place] - Primary    Well controlled Continue current meds UTD on screenings On ARB Not on statin as below      Relevant Orders   POCT glycosylated hemoglobin (Hb A1C) (Completed)   Hyperlipidemia associated with type 2 diabetes mellitus ([place]    Did not tolerate zetia and hesitant to take statins due to myalgias Reviewed last lipids Repeat FLP and CMP      Relevant Medications   hydrochlorothiazide (HYDRODIURIL) 25 MG tablet   Other Relevant Orders   Comprehensive metabolic panel   Lipid panel     Other   Myalgia due to statin   Other Visit Diagnoses     Need for influenza vaccination       Relevant Orders   Flu Vaccine QUAD High Dose(Fluad) (Completed)        Return in about 6 months (around 07/29/2021) for CPE, AWV.      I,Essence Turner,acting as a sEducation administratorfor ALavon Paganini MD.,have  documented all relevant documentation on the behalf of ALavon Paganini MD,as directed by  ALavon Paganini MD while in the presence of ALavon Paganini MD.  I, ALavon Paganini MD, have reviewed all documentation for this visit. The documentation on 01/29/21 for the exam, diagnosis, procedures, and orders are all accurate and complete.   Tedrick Port, ADionne Bucy MD, MPH [facility]

## 2021-01-29 NOTE — Assessment & Plan Note (Signed)
Did not tolerate zetia and hesitant to take statins due to myalgias Reviewed last lipids Repeat FLP and CMP

## 2021-01-29 NOTE — Assessment & Plan Note (Signed)
Well controlled Continue current medications, except switch monoduretic to HCTZ 25mg  daily) Recheck metabolic panel F/u in 6 months

## 2021-01-30 NOTE — Progress Notes (Signed)
Cardiology Office Note  Date:  02/01/2021   ID:  Jessica Wheeler, DOB 05-13-1940, MRN [id number]  PCP:  Virginia Crews, MD   Chief Complaint  Patient presents with   Follow-up    12 month  For CAD   HPI:  [name] is a very pleasant 80 year-old woman with a history of  diabetes, hemoglobin A1c 7.0 hypertension,  Former smoker  aortic dissection following catheterization,   short region of dissection in the distal aorta, seen on CT scan in 2010 renal artery stenosis followed by Dr. Lucky Cowboy.  Chronically low sodium.  Cardiac catheterization in 2007  showed 20% proximal RCA disease and 20% LAD disease.short region of dissection in the distal aorta, seen on CT scan in 2010 aortoiliac atherosclerosis, followed by Dr. Lucky Cowboy who presents for routine followup of her coronary artery disease.   Had neck surgery 04/01/2020 2 surgeries three days apart, Dr. Bynum Bellows better  Having frequent cramps, worse at night, not on a statin On amiloride but being transitioned to HCTZ by primary care Reports that she needs a diuretic "I need it" for fluid retention No edema on today's visit Drinks tomato juice for her low sodium  Lab work reviewed A1C 6.6  Off zetia: "makes me ache" Not on a statin  EKG personally reviewed by myself on todays visit Shows normal sinus rhythm with rate 74 bpm right bundle branch block  MRI: Reviewed with her 1. Severe canal stenosis at L3-L4 and moderate canal stenosis at L2-L3. Grade 2 anterolisthesis of L4 on L5 with moderate bilateral subarticular recess narrowing and mild-to-moderate central canal stenosis. 2. Moderate foraminal stenosis on the left at L2-L3 and mild to moderate foraminal stenosis bilaterally at L3-L4 and L4-L5.  CT neck  Severe spinal stenosis because of chronic calcified disc herniations at C3-4, C4-5 and C5-6. Facet arthropathy with foraminal stenosis that could cause neural compression at C3-4 left more than right, C4-5 left  more than right, C5-6 right worse than left and C6-7 bilateral.  Other past medical history reviewed lower extremity arterial studies from Dr. Lucky Cowboy  Noninvasive studies show mildly elevated velocities in the common iliac arteries bilaterally although this does not appear to be significantly high-grade.  Her aorta is not aneurysmal.  Previously with Intolerance to imdur, ziac  feels terrible on beta blockers   Previous  Echocardiogram was performed that was normal  Stress test/Myoview was also normal showing no ischemia .    PMH:   has a past medical history of Allergy, Arthritis, Bundle branch block, right, C7 cervical fracture ([place]), Diabetes mellitus, Diffuse cystic mastopathy, Dissection, aorta ([place]), Diverticulosis, Family history of malignant neoplasm of gastrointestinal tract, Glaucoma (2005), Heart murmur, History of bronchitis, History of pancreatitis (2005), Hyperlipidemia, Hypertension (1982), Personal history of tobacco use, presenting hazards to health, RBBB, and Ulcer.  PSH:    Past Surgical History:  Procedure Laterality Date   ABDOMINAL HYSTERECTOMY  1991   ANTERIOR CERVICAL CORPECTOMY N/A 03/30/2020   Procedure: CORPECTOMY CERVICAL FOUR - CERVICAL FIVE;  Surgeon: Newman Pies, MD;  Location: [place];  Service: Neurosurgery;  Laterality: N/A;  anterior   APPENDECTOMY     BACK SURGERY  1982   for lumbar ruptured disc. No hardware, no discectomy, no fusion   BLADDER SURGERY     BREAST BIOPSY Right 1980's   benign   BREAST EXCISIONAL BIOPSY Right 1980s   benign   BREAST SURGERY     biopsy   CARDIAC CATHETERIZATION  Dr. Ubaldo Glassing, few years ago   CATARACT EXTRACTION W/PHACO Left 08/31/2015   Procedure: CATARACT EXTRACTION PHACO AND INTRAOCULAR LENS PLACEMENT ([place]);  Surgeon: Ronnell Freshwater, MD;  Location: [place];  Service: Ophthalmology;  Laterality: Left;  DIABETIC - oral meds   CATARACT EXTRACTION W/PHACO Right 10/12/2015   Procedure: CATARACT  EXTRACTION PHACO AND INTRAOCULAR LENS PLACEMENT ([place]) right eye;  Surgeon: Ronnell Freshwater, MD;  Location: [place];  Service: Ophthalmology;  Laterality: Right;  DIABETIC - oral meds   CHOLECYSTECTOMY  1994   COLONOSCOPY  2008,02/22/2012   Dr. Sankar-2013   COLONOSCOPY WITH PROPOFOL N/A 04/04/2017   Procedure: COLONOSCOPY WITH PROPOFOL;  Surgeon: Christene Lye, MD;  Location: [facility];  Service: Endoscopy;  Laterality: N/A;   POSTERIOR CERVICAL FUSION/FORAMINOTOMY N/A 04/01/2020   Procedure: POSTERIOR CERVICAL FUSION/FORAMINOTOMY CERVICAL FOUR- CERVICAL SIX;  Surgeon: Newman Pies, MD;  Location: [place];  Service: Neurosurgery;  Laterality: N/A;   REFRACTIVE SURGERY      Current Outpatient Medications  Medication Sig Dispense Refill   Accu-Chek FastClix Lancets MISC 1 strip by Does not apply route daily. 102 each 3   amLODipine (NORVASC) 5 MG tablet TAKE 1 TABLET EVERY DAY 90 tablet 1   aspirin 81 MG EC tablet Take 81 mg by mouth daily.     Blood Glucose Monitoring Suppl (ACCU-CHEK GUIDE) w/Device KIT USE AS DIRECTED TO CHECK FASTING BLOOD SUGAR DAILY 1 kit 0   Cyanocobalamin (VITAMIN B-12) 1000 MCG/15ML LIQD Take 1,000 mcg by mouth daily.      glucose blood (ACCU-CHEK GUIDE) test strip Use as instructed 100 each 12   hydrochlorothiazide (HYDRODIURIL) 25 MG tablet Take 1 tablet (25 mg total) by mouth daily. 90 tablet 3   ibuprofen (ADVIL) 200 MG tablet Take 200 mg by mouth daily at 6 (six) AM.     Lancets Misc. (ACCU-CHEK FASTCLIX LANCET) KIT 1 each by Does not apply route as directed. 1 kit 1   latanoprost (XALATAN) 0.005 % ophthalmic solution Place 1 drop into both eyes at bedtime.   3   losartan (COZAAR) 100 MG tablet TAKE 1 TABLET EVERY DAY 90 tablet 1   Magnesium 250 MG TABS Take 250 mg by mouth daily.     metFORMIN (GLUCOPHAGE) 500 MG tablet TAKE 2 TABLETS TWICE DAILY WITH MEALS 360 tablet 0   No current facility-administered medications for this  visit.     Allergies:   Beta adrenergic blockers, Jardiance [empagliflozin], Levaquin [levofloxacin in d5w], Lisinopril, Zetia [ezetimibe], Prevnar [pneumococcal 13-val conj vacc], and Sulfa antibiotics   Social History:  The patient  reports that she quit smoking about 17 years ago. Her smoking use included cigarettes. She has a 15.00 pack-year smoking history. She has never used smokeless tobacco. She reports that she does not drink alcohol and does not use drugs.   Family History:   family history includes Anxiety disorder in her mother; Breast cancer (age of onset: 89) in her cousin; Breast cancer (age of onset: 7) in her maternal aunt; Colon cancer in her brother; Diabetes in her mother; Heart attack in her father; Heart disease in her father; Hypertension in her maternal grandfather, maternal grandmother, and mother; Rheum arthritis in her father.    Review of Systems: Review of Systems  Constitutional: Negative.   HENT: Negative.    Respiratory: Negative.    Cardiovascular: Negative.   Gastrointestinal: Negative.   Musculoskeletal:  Positive for joint pain.  Neurological: Negative.   Psychiatric/Behavioral: Negative.  All other systems reviewed and are negative.  PHYSICAL EXAM: VS:  BP (!) 146/62   Pulse 74   Ht '5\' 3"'  (1.6 m)   Wt 142 lb (64.4 kg)   SpO2 98%   BMI 25.15 kg/m  , BMI Body mass index is 25.15 kg/m. Constitutional:  oriented to person, place, and time. No distress.  HENT:  Head: Grossly normal Eyes:  no discharge. No scleral icterus.  Neck: No JVD, no carotid bruits  Cardiovascular: Regular rate and rhythm, no murmurs appreciated Pulmonary/Chest: Clear to auscultation bilaterally, no wheezes or rails Abdominal: Soft.  no distension.  no tenderness.  Musculoskeletal: Normal range of motion Neurological:  normal muscle tone. Coordination normal. No atrophy Skin: Skin warm and dry Psychiatric: normal affect, pleasant  Recent Labs: 07/23/2020: ALT  25 09/10/2020: BUN 14; Creatinine, Ser 0.74; Hemoglobin 13.2; Magnesium 2.0; Platelets 287; Potassium 4.1; Sodium 134    Lipid Panel Lab Results  Component Value Date   CHOL 190 03/13/2020   HDL 84 03/13/2020   LDLCALC 89 03/13/2020   TRIG 95 03/13/2020    Wt Readings from Last 3 Encounters:  02/01/21 142 lb (64.4 kg)  01/29/21 141 lb (64 kg)  09/10/20 143 lb (64.9 kg)     ASSESSMENT AND PLAN:  Coronary artery disease involving native coronary artery of native heart without angina pectoris -  Denies anginal symptoms,  Reports unable to tolerate Zetia Previously declined statin We will not start a statin at this time given she has severe cramping.  This would only confuse etiology of her muscle symptoms  Essential hypertension -  Blood pressure is well controlled on today's visit. No changes made to the medications.  Pure hypercholesterolemia Not on a statin secondary to current situation with her severe cramping Did not tolerate Zetia If cramping persists, may need to consider other options such as PCSK9 inhibitor  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HCC) CT scan October 2021, no significant issue reported  Muscle cramping Reports is debilitating, etiology unclear May need to stop diuretics altogether or take them only as needed She has no edema Potentially if she stops her amiloride and does not start HCTZ, could take amlodipine 10 for hypertension Other agents could also be used  Type 2 diabetes mellitus with other specified complication, without long-term current use of insulin (HCC) Weight stable, A1c 6.6, low end of her range  Spinal stenosis Underwent surgery with Dr. Arnoldo Morale, feels she has a good result   Total encounter time more than 25 minutes  Greater than 50% was spent in counseling and coordination of care with the patient    Orders Placed This Encounter  Procedures   EKG 12-Lead     Signed, Esmond Plants, M.D., Ph.D. 02/01/2021  [facility] [place], [place] [phone]

## 2021-02-01 ENCOUNTER — Other Ambulatory Visit: Payer: Self-pay

## 2021-02-01 ENCOUNTER — Ambulatory Visit: Payer: Medicare HMO | Admitting: Cardiovascular Disease

## 2021-02-01 ENCOUNTER — Encounter: Payer: Self-pay | Admitting: Cardiovascular Disease

## 2021-02-01 VITALS — BP 146/62 | HR 74 | Ht 63.0 in | Wt 142.0 lb

## 2021-02-01 DIAGNOSIS — T466X5A Adverse effect of antihyperlipidemic and antiarteriosclerotic drugs, initial encounter: Secondary | ICD-10-CM | POA: Diagnosis not present

## 2021-02-01 DIAGNOSIS — E782 Mixed hyperlipidemia: Secondary | ICD-10-CM | POA: Diagnosis not present

## 2021-02-01 DIAGNOSIS — E1159 Type 2 diabetes mellitus with other circulatory complications: Secondary | ICD-10-CM | POA: Diagnosis not present

## 2021-02-01 DIAGNOSIS — I7102 Dissection of abdominal aorta: Secondary | ICD-10-CM | POA: Diagnosis not present

## 2021-02-01 DIAGNOSIS — I25118 Atherosclerotic heart disease of native coronary artery with other forms of angina pectoris: Secondary | ICD-10-CM

## 2021-02-01 DIAGNOSIS — M791 Myalgia, unspecified site: Secondary | ICD-10-CM | POA: Diagnosis not present

## 2021-02-01 DIAGNOSIS — I701 Atherosclerosis of renal artery: Secondary | ICD-10-CM | POA: Diagnosis not present

## 2021-02-01 DIAGNOSIS — I10 Essential (primary) hypertension: Secondary | ICD-10-CM

## 2021-02-01 NOTE — Patient Instructions (Addendum)
Medication Instructions:  No changes  If you need a refill on your cardiac medications before your next appointment, please call your pharmacy.   Lab work: No new labs needed  Testing/Procedures: No new testing needed   Follow-Up: At [facility], you and your health needs are our priority.  As part of our continuing mission to provide you with exceptional heart care, we have created designated Provider Care Teams.  These Care Teams include your primary Cardiologist (physician) and Advanced Practice Providers (APPs -  Physician Assistants and Nurse Practitioners) who all work together to provide you with the care you need, when you need it.  You will need a follow up appointment in 6 months  Providers on your designated Care Team:   Christopher Berge, NP Ryan Dunn, PA-C Jacquelyn Visser, PA-C Cadence Furth, PA-C   COVID-19 Vaccine Information can be found at: [email or web address] For questions related to vaccine distribution or appointments, please email [email or web address] or call [phone].    

## 2021-02-02 DIAGNOSIS — M4712 Other spondylosis with myelopathy, cervical region: Secondary | ICD-10-CM | POA: Diagnosis not present

## 2021-02-03 DIAGNOSIS — E1159 Type 2 diabetes mellitus with other circulatory complications: Secondary | ICD-10-CM | POA: Diagnosis not present

## 2021-02-03 DIAGNOSIS — E785 Hyperlipidemia, unspecified: Secondary | ICD-10-CM | POA: Diagnosis not present

## 2021-02-03 DIAGNOSIS — I152 Hypertension secondary to endocrine disorders: Secondary | ICD-10-CM | POA: Diagnosis not present

## 2021-02-03 DIAGNOSIS — E1169 Type 2 diabetes mellitus with other specified complication: Secondary | ICD-10-CM | POA: Diagnosis not present

## 2021-02-04 ENCOUNTER — Other Ambulatory Visit: Payer: Self-pay | Admitting: Family Medicine

## 2021-02-04 DIAGNOSIS — Z1231 Encounter for screening mammogram for malignant neoplasm of breast: Secondary | ICD-10-CM

## 2021-02-04 LAB — COMPREHENSIVE METABOLIC PANEL
ALT: 25 IU/L (ref 0–32)
AST: 27 IU/L (ref 0–40)
Albumin/Globulin Ratio: 2 (ref 1.2–2.2)
Albumin: 4.7 g/dL — ABNORMAL HIGH (ref 3.6–4.6)
Alkaline Phosphatase: 53 IU/L (ref 44–121)
BUN/Creatinine Ratio: 19 (ref 12–28)
BUN: 13 mg/dL (ref 8–27)
Bilirubin Total: 0.4 mg/dL (ref 0.0–1.2)
CO2: 22 mmol/L (ref 20–29)
Calcium: 9.8 mg/dL (ref 8.7–10.3)
Chloride: 97 mmol/L (ref 96–106)
Creatinine, Ser: 0.7 mg/dL (ref 0.57–1.00)
Globulin, Total: 2.4 g/dL (ref 1.5–4.5)
Glucose: 142 mg/dL — ABNORMAL HIGH (ref 70–99)
Potassium: 4.7 mmol/L (ref 3.5–5.2)
Sodium: 134 mmol/L (ref 134–144)
Total Protein: 7.1 g/dL (ref 6.0–8.5)
eGFR: 87 mL/min/{1.73_m2} (ref >59)

## 2021-02-04 LAB — LIPID PANEL
Chol/HDL Ratio: 2.2 ratio (ref 0.0–4.4)
Cholesterol, Total: 187 mg/dL (ref 100–199)
HDL: 84 mg/dL (ref >39)
LDL Chol Calc (NIH): 90 mg/dL (ref 0–99)
Triglycerides: 70 mg/dL (ref 0–149)
VLDL Cholesterol Cal: 13 mg/dL (ref 5–40)

## 2021-02-18 ENCOUNTER — Ambulatory Visit: Payer: Medicare HMO | Admitting: Podiatry

## 2021-02-18 ENCOUNTER — Other Ambulatory Visit: Payer: Self-pay

## 2021-02-18 ENCOUNTER — Encounter: Payer: Self-pay | Admitting: Podiatry

## 2021-02-18 DIAGNOSIS — M79674 Pain in right toe(s): Secondary | ICD-10-CM | POA: Diagnosis not present

## 2021-02-18 DIAGNOSIS — M79675 Pain in left toe(s): Secondary | ICD-10-CM | POA: Diagnosis not present

## 2021-02-18 DIAGNOSIS — M2041 Other hammer toe(s) (acquired), right foot: Secondary | ICD-10-CM | POA: Diagnosis not present

## 2021-02-18 DIAGNOSIS — M2042 Other hammer toe(s) (acquired), left foot: Secondary | ICD-10-CM | POA: Diagnosis not present

## 2021-02-18 DIAGNOSIS — B351 Tinea unguium: Secondary | ICD-10-CM

## 2021-02-18 DIAGNOSIS — E1159 Type 2 diabetes mellitus with other circulatory complications: Secondary | ICD-10-CM | POA: Diagnosis not present

## 2021-02-23 ENCOUNTER — Encounter: Payer: Self-pay | Admitting: General Surgery

## 2021-02-23 ENCOUNTER — Other Ambulatory Visit: Payer: Self-pay

## 2021-02-23 ENCOUNTER — Ambulatory Visit
Admission: RE | Admit: 2021-02-23 | Discharge: 2021-02-23 | Disposition: A | Discharge status: Home / Self Care | Payer: Medicare HMO | Source: Ambulatory Visit | Attending: Family Medicine | Admitting: Family Medicine

## 2021-02-23 DIAGNOSIS — Z1231 Encounter for screening mammogram for malignant neoplasm of breast: Secondary | ICD-10-CM | POA: Diagnosis not present

## 2021-02-24 NOTE — Progress Notes (Signed)
  Subjective:  Patient ID: Jessica Wheeler, female    DOB: 05-31-1940,  MRN: [id number]  Chief Complaint  Patient presents with   Callouses    Left foot pain    80 y.o. female returns for the above complaint.  Patient is here for thickened elongated dystrophic toenails that are painful in nature x10.  Patient states that it is hard to ambulate on.  She would like to have them debrided down.  S she is also here to get a second set of diabetic shoes.  She states the previous ones are greater than 20-year-old.  She denies any other acute complaints.  Objective:  There were no vitals filed for this visit. Podiatric Exam: Vascular: dorsalis pedis and posterior tibial pulses are palpable bilateral. Capillary return is immediate. Temperature gradient is WNL. Skin turgor WNL  Sensorium: Normal Semmes Weinstein monofilament test. Normal tactile sensation bilaterally. Nail Exam: Pt has thick disfigured discolored nails with subungual debris noted bilateral entire nail hallux through fifth toenails Ulcer Exam: There is no evidence of ulcer or pre-ulcerative changes or infection. Orthopedic Exam: Muscle tone and strength are WNL. No limitations in general ROM. No crepitus or effusions noted. HAV  B/L.  Hammer toes 2-5  B/L.  Bunion deformity noted to the left foot.  Reducible in nature.  Not back down but tracking deformity.  Mild pain on palpation.  No intra-articular pain of the first metatarsophalangeal joint left side. Skin: No Porokeratosis. No infection or ulcers  Assessment & Plan:  Patient was evaluated and treated and all questions answered.  Onychomycosis with pain  -Nails palliatively debrided as below. -Educated on self-care  Left bunion deformity -I explained the patient the etiology of bunion deformity and various treatment options were extensively discussed.  Given that patient has mild pain to the area I believe she will benefit from just conservative care which shoe gear modification  which were discussed with the patient.  I also explained to her that she can obtain bunion pad from over-the-counter that can give her some temporary relief.  If she does not have adequate relief we will consider the orthotics management or surgical intervention.  She states understanding  Bilateral submetatarsal 3 porokeratosis/benign skin lesion -Resolved with aggressive debridement with excision of the nucleated core.  No further therapies are indicated. -Patient will benefit from a new set of diabetic shoes as the previous ones are greater than 4-year-old.  Procedure: Nail Debridement Rationale: pain  Type of Debridement: manual, sharp debridement. Instrumentation: Nail nipper, rotary burr. Number of Nails: 10  Procedures and Treatment: Consent by patient was obtained for treatment procedures. The patient understood the discussion of treatment and procedures well. All questions were answered thoroughly reviewed. Debridement of mycotic and hypertrophic toenails, 1 through 5 bilateral and clearing of subungual debris. No ulceration, no infection noted.  Return Visit-Office Procedure: Patient instructed to return to the office for a follow up visit 3 months for continued evaluation and treatment.  Nicholes Rough, DPM    No follow-ups on file.

## 2021-02-25 ENCOUNTER — Other Ambulatory Visit: Payer: Self-pay | Admitting: Family Medicine

## 2021-02-25 DIAGNOSIS — E1169 Type 2 diabetes mellitus with other specified complication: Secondary | ICD-10-CM

## 2021-02-25 NOTE — Telephone Encounter (Signed)
Requested Prescriptions  Pending Prescriptions Disp Refills  . metFORMIN (GLUCOPHAGE) 500 MG tablet [Pharmacy Med Name: METFORMIN HYDROCHLORIDE 500 MG Tablet] 360 tablet 1    Sig: TAKE 2 TABLETS TWICE DAILY WITH MEALS     Endocrinology:  Diabetes - Biguanides Passed - 02/25/2021  3:24 AM      Passed - Cr in normal range and within 360 days    Creatinine  Date Value Ref Range Status  10/01/2011 0.72 0.60 - 1.30 mg/dL Final   Creatinine, Ser  Date Value Ref Range Status  02/03/2021 0.70 0.57 - 1.00 mg/dL Final         Passed - HBA1C is between 0 and 7.9 and within 180 days    Hemoglobin A1C  Date Value Ref Range Status  01/29/2021 6.6 (A) 4.0 - 5.6 % Final  03/08/2017 6.6  Final   Hgb A1c MFr Bld  Date Value Ref Range Status  07/23/2020 7.4 (H) 4.8 - 5.6 % Final    Comment:             Prediabetes: 5.7 - 6.4          Diabetes: >6.4          Glycemic control for adults with diabetes: <7.0          Passed - eGFR in normal range and within 360 days    EGFR (African American)  Date Value Ref Range Status  10/01/2011 >60  Final   GFR calc Af Amer  Date Value Ref Range Status  03/13/2020 82 >59 mL/min/1.73 Final    Comment:    **In accordance with recommendations from the NKF-ASN Task force,**   [facility] is in the process of updating its eGFR calculation to the   2021 CKD-EPI creatinine equation that estimates kidney function   without a race variable.    EGFR (Non-African Amer.)  Date Value Ref Range Status  10/01/2011 >60  Final    Comment:    eGFR values <33m/min/1.73 m2 may be an indication of chronic kidney disease (CKD). Calculated eGFR is useful in patients with stable renal function. The eGFR calculation will not be reliable in acutely ill patients when serum creatinine is changing rapidly. It is not useful in  patients on dialysis. The eGFR calculation may not be applicable to patients at the low and high extremes of body sizes, pregnant women, and  vegetarians.    GFR, Estimated  Date Value Ref Range Status  03/31/2020 >60 >60 mL/min Final    Comment:    (NOTE) Calculated using the CKD-EPI Creatinine Equation (2021)    GFR  Date Value Ref Range Status  05/09/2016 84.79 >60.00 mL/min Final   eGFR  Date Value Ref Range Status  02/03/2021 87 >59 mL/min/1.73 Final         Passed - Valid encounter within last 6 months    Recent Outpatient Visits          3 weeks ago Type 2 diabetes mellitus with other specified complication, without long-term current use of insulin ([facility]   2 months ago [facility]ough   [facility]   5 months ago Essential hypertension   [place]ki Muff PA-C   7 months ago Encounter for annual physical exam   [facility]   9 months ago [facility]icogenic headache   [facility]  Future Appointments            In 4 months Bacigalupo, [name], MD [facility], [place]

## 2021-03-02 ENCOUNTER — Ambulatory Visit: Payer: Medicare HMO | Admitting: Family Medicine

## 2021-03-05 ENCOUNTER — Telehealth: Payer: Self-pay | Admitting: Cardiovascular Disease

## 2021-03-05 NOTE — Telephone Encounter (Signed)
Spoke with patient and she reports Dr. Mariah Milling wanted her to hold all diuretics to see if that would help with her cramping. Last night she woke up with spasm and tenderness to her chest. She did call EMS and her EKG was normal. Blood pressures were elevated and states that Dr. Mariah Milling had previously told her to double up on her amlodipine if they were elevated. She did take extra dose and blood pressure was 137/87 after that increase. Instructed her to continue the Amlodipine 10 mg, monitor her blood pressures, and keep a log of those readings.   Then we discussed the spasm she experienced. She states this spasm was the same as her leg cramping she has had before. Advised that with any chest pain/pressure would need to be evaluated in the ED. Then we discussed other signs and symptoms that would be concerning and require ED evaluation. She verbalized understanding of our conversation but declines going for now. Encouraged her to please seek care if her symptoms persist or worsen and she agreed. Scheduled her to be seen on Monday with provider. She was appreciative for the time and was agreeable with the plan.

## 2021-03-05 NOTE — Telephone Encounter (Signed)
   Pt c/o of Chest Pain: STAT if CP now or developed within 24 hours  1. Are you having CP right now? Yes tightness or pressure   2. Are you experiencing any other symptoms (ex. SOB, nausea, vomiting, sweating)? No sob n/v sweating .  Reports dizziness last night   3. How long have you been experiencing CP? Comes and goes unclear on duration describes last nights episode as different   4. Is your CP continuous or coming and going? Intermittent chest pressure  feels like a muscle would before a spasm   5. Have you taken Nitroglycerin? No too 3 asa 81 mg and called 911 reports EKG normal . . .   10am 161/70  85 4pm 137/57  87  8 pm chest tightness / pressure upon waking up felt like a muscle spasm  8pm  184/82  100 810pm  183/84  107 949pm  188/83  100 Called 911 took 3 asa 81 mg  EKG normal   451am  150/69  96 6am  142/67  84 ?

## 2021-03-07 NOTE — Progress Notes (Signed)
Cardiology Office Note  Date:  03/08/2021   ID:  Jessica Wheeler, DOB 1940-07-04, MRN [id number]  PCP:  Virginia Crews, MD    Cc: Cramping  HPI:  Jessica Wheeler is a 80 year-old woman with a history of  diabetes, hemoglobin A1c 7.0 hypertension,  Former smoker  aortic dissection following catheterization,   short region of dissection in the distal aorta, seen on CT scan in 2010 renal artery stenosis followed by Dr. Lucky Cowboy.  Chronically low sodium.  Cardiac catheterization in 2007  showed 20% proximal RCA disease and 20% LAD disease.short region of dissection in the distal aorta, seen on CT scan in 2010 aortoiliac atherosclerosis, followed by Dr. Lucky Cowboy who presents for routine followup of her coronary artery disease.   Recently seen 1 month ago for follow-up, at that time reported significant cramping in her legs, we held the diuretic On today's visit, Still with cramps, no better off the diuretic for 10 days  Also reports having significant chest pain/rib pain episode,sore to touch, Had to call EMTs, blood pressure was elevated, EKG was normal  Couple episodes of chest cramping, leg cramping, pain down the leg concerning for sciatica  In terms of her cramping, not on a statin, she reports no improvement by holding amiloride and or HCTZ Feels better when she is on amiloride quarter pill daily, feels she is having worsening leg swelling on the left (not particularly impressive on exam)  Leg swelling worse if she is on her feet On last clinic visit reported that she drinks tomato juice for her low sodium  Normal labs, BMP Off jardiance, had side effects  EKG personally reviewed by myself on todays visit Normal sinus rhythm rate 86 bpm no significant ST-T wave changes   Had neck surgery 04/01/2020 2 surgeries three days apart, Dr. Arnoldo Morale Feels better  Lab work reviewed A1C 6.6  Off zetia: "makes me ache" Not on a statin  MRI:  1. Severe canal stenosis at L3-L4 and moderate  canal stenosis at L2-L3. Grade 2 anterolisthesis of L4 on L5 with moderate bilateral subarticular recess narrowing and mild-to-moderate central canal stenosis. 2. Moderate foraminal stenosis on the left at L2-L3 and mild to moderate foraminal stenosis bilaterally at L3-L4 and L4-L5.  CT neck  Severe spinal stenosis because of chronic calcified disc herniations at C3-4, C4-5 and C5-6. Facet arthropathy with foraminal stenosis that could cause neural compression at C3-4 left more than right, C4-5 left more than right, C5-6 right worse than left and C6-7 bilateral.  lower extremity arterial studies from Dr. Lucky Cowboy  Noninvasive studies show mildly elevated velocities in the common iliac arteries bilaterally although this does not appear to be significantly high-grade.  Her aorta is not aneurysmal.  Previously with Intolerance to imdur, ziac  feels terrible on beta blockers   Previous  Echocardiogram was performed that was normal  Stress test/Myoview was also normal showing no ischemia .    PMH:   has a past medical history of Allergy, Arthritis, Bundle branch block, right, C7 cervical fracture ([place]), Diabetes mellitus, Diffuse cystic mastopathy, Dissection, aorta ([place]), Diverticulosis, Family history of malignant neoplasm of gastrointestinal tract, Glaucoma (2005), Heart murmur, History of bronchitis, History of pancreatitis (2005), Hyperlipidemia, Hypertension (1982), Personal history of tobacco use, presenting hazards to health, RBBB, and Ulcer.  PSH:    Past Surgical History:  Procedure Laterality Date   ABDOMINAL HYSTERECTOMY  1991   ANTERIOR CERVICAL CORPECTOMY N/A 03/30/2020   Procedure: CORPECTOMY CERVICAL FOUR - CERVICAL FIVE;  Surgeon: Newman Pies, MD;  Location: [facility];  Service: Neurosurgery;  Laterality: N/A;  anterior   APPENDECTOMY     BACK SURGERY  1982   for lumbar ruptured disc. No hardware, no discectomy, no fusion   BLADDER SURGERY     BREAST BIOPSY Right 1980's    benign   BREAST EXCISIONAL BIOPSY Right 1980s   benign   BREAST SURGERY     biopsy   CARDIAC CATHETERIZATION     Dr. Ubaldo Glassing, few years ago   CATARACT EXTRACTION W/PHACO Left 08/31/2015   Procedure: CATARACT EXTRACTION PHACO AND INTRAOCULAR LENS PLACEMENT ([place]);  Surgeon: Ronnell Freshwater, MD;  Location: [facility];  Service: Ophthalmology;  Laterality: Left;  DIABETIC - oral meds   CATARACT EXTRACTION W/PHACO Right 10/12/2015   Procedure: CATARACT EXTRACTION PHACO AND INTRAOCULAR LENS PLACEMENT ([place]) right eye;  Surgeon: Ronnell Freshwater, MD;  Location: [facility];  Service: Ophthalmology;  Laterality: Right;  DIABETIC - oral meds   CHOLECYSTECTOMY  1994   COLONOSCOPY  2008,02/22/2012   Dr. Sankar-2013   COLONOSCOPY WITH PROPOFOL N/A 04/04/2017   Procedure: COLONOSCOPY WITH PROPOFOL;  Surgeon: Christene Lye, MD;  Location: [facility];  Service: Endoscopy;  Laterality: N/A;   POSTERIOR CERVICAL FUSION/FORAMINOTOMY N/A 04/01/2020   Procedure: POSTERIOR CERVICAL FUSION/FORAMINOTOMY CERVICAL FOUR- CERVICAL SIX;  Surgeon: Newman Pies, MD;  Location: [facility];  Service: Neurosurgery;  Laterality: N/A;   REFRACTIVE SURGERY      Current Outpatient Medications  Medication Sig Dispense Refill   Accu-Chek FastClix Lancets MISC 1 strip by Does not apply route daily. 102 each 3   amLODipine (NORVASC) 10 MG tablet Take 10 mg by mouth daily.     aspirin 81 MG EC tablet Take 81 mg by mouth daily.     Blood Glucose Monitoring Suppl (ACCU-CHEK GUIDE) w/Device KIT USE AS DIRECTED TO CHECK FASTING BLOOD SUGAR DAILY 1 kit 0   glucose blood (ACCU-CHEK GUIDE) test strip Use as instructed 100 each 12   ibuprofen (ADVIL) 200 MG tablet Take 200 mg by mouth daily at 6 (six) AM.     Lancets Misc. (ACCU-CHEK FASTCLIX LANCET) KIT 1 each by Does not apply route as directed. 1 kit 1   latanoprost (XALATAN) 0.005 % ophthalmic solution Place 1 drop into both eyes at bedtime.   3    losartan (COZAAR) 100 MG tablet TAKE 1 TABLET EVERY DAY 90 tablet 1   Magnesium 250 MG TABS Take 250 mg by mouth daily.     metFORMIN (GLUCOPHAGE) 500 MG tablet TAKE 2 TABLETS TWICE DAILY WITH MEALS 360 tablet 1   vitamin B-12 (CYANOCOBALAMIN) 1000 MCG tablet Take 1,000 mcg by mouth daily.     No current facility-administered medications for this visit.     Allergies:   Beta adrenergic blockers, Jardiance [empagliflozin], Levaquin [levofloxacin in d5w], Lisinopril, Zetia [ezetimibe], Prevnar [pneumococcal 13-val conj vacc], and Sulfa antibiotics   Social History:  The patient  reports that she quit smoking about 17 years ago. Her smoking use included cigarettes. She has a 15.00 pack-year smoking history. She has never used smokeless tobacco. She reports that she does not drink alcohol and does not use drugs.   Family History:   family history includes Anxiety disorder in her mother; Breast cancer (age of onset: 8) in her cousin; Breast cancer (age of onset: 48) in her maternal aunt; Colon cancer in her brother; Diabetes in her mother; Heart attack in her father; Heart disease in her  father; Hypertension in her maternal grandfather, maternal grandmother, and mother; Rheum arthritis in her father.    Review of Systems: Review of Systems  Constitutional: Negative.   HENT: Negative.    Respiratory: Negative.    Cardiovascular: Negative.   Gastrointestinal: Negative.   Musculoskeletal:  Positive for joint pain.  Neurological: Negative.   Psychiatric/Behavioral: Negative.    All other systems reviewed and are negative.  PHYSICAL EXAM: VS:  BP (!) 164/70 (BP Location: Left Arm, Patient Position: Sitting, Cuff Size: Normal)   Pulse 86   Ht _0  (1.6 m)   Wt 139 lb 6 oz (63.2 kg)   SpO2 98%   BMI 24.69 kg/m  , BMI Body mass index is 24.69 kg/m. Constitutional:  oriented to person, place, and time. No distress.  HENT:  Head: Grossly normal Eyes:  no discharge. No scleral icterus.   Neck: No JVD, no carotid bruits  Cardiovascular: Regular rate and rhythm, no murmurs appreciated Pulmonary/Chest: Clear to auscultation bilaterally, no wheezes or rails Abdominal: Soft.  no distension.  no tenderness.  Musculoskeletal: Normal range of motion Neurological:  normal muscle tone. Coordination normal. No atrophy Skin: Skin warm and dry Psychiatric: normal affect, pleasant  Recent Labs: 09/10/2020: Hemoglobin 13.2; Magnesium 2.0; Platelets 287 02/03/2021: ALT 25; BUN 13; Creatinine, Ser 0.70; Potassium 4.7; Sodium 134    Lipid Panel Lab Results  Component Value Date   CHOL 187 02/03/2021   HDL 84 02/03/2021   LDLCALC 90 02/03/2021   TRIG 70 02/03/2021    Wt Readings from Last 3 Encounters:  03/08/21 139 lb 6 oz (63.2 kg)  02/01/21 142 lb (64.4 kg)  01/29/21 141 lb (64 kg)     ASSESSMENT AND PLAN:  Coronary artery disease involving native coronary artery of native heart without angina pectoris -  Denies anginal symptoms,  Reports unable to tolerate Zetia, declining statins Will not start any new medications given cramping  Muscle cramping Not on a statin, having cramps in her chest, legs, She is very concerned and wanting an answer as to etiology We will send off some lab work to rule out inflammatory process  Essential hypertension -  Blood pressure elevated on today's visit, but reports typically well controlled at home systolic 407.  Recommend she continue to monitor pressure and call us if it runs high  Pure hypercholesterolemia Not on a statin secondary to current situation with her severe cramping Did not tolerate Zetia Could consider PCSK9 inhibitor in the future  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HCC) CT scan October 2021, no significant issue reported  Type 2 diabetes mellitus with other specified complication, without long-term current use of insulin (HCC) Weight stable, typically A1c in the 6 range  Spinal stenosis Underwent surgery with Dr.  Arnoldo Morale, feels she has a good result   Total encounter time more than 25 minutes  Greater than 50% was spent in counseling and coordination of care with the patient    Orders Placed This Encounter  Procedures   C-reactive protein   CK (Creatine Kinase)   Sedimentation rate   Aldolase   Antinuclear Antib (ANA)   EKG 12-Lead     Signed, Esmond Plants, M.D., Ph.D. 03/08/2021  [place], [place] [phone]

## 2021-03-08 ENCOUNTER — Other Ambulatory Visit: Payer: Self-pay

## 2021-03-08 ENCOUNTER — Encounter: Payer: Self-pay | Admitting: Cardiovascular Disease

## 2021-03-08 ENCOUNTER — Ambulatory Visit: Payer: Medicare HMO | Admitting: Cardiovascular Disease

## 2021-03-08 VITALS — BP 164/70 | HR 86 | Ht 63.0 in | Wt 139.4 lb

## 2021-03-08 DIAGNOSIS — I10 Essential (primary) hypertension: Secondary | ICD-10-CM

## 2021-03-08 DIAGNOSIS — I25118 Atherosclerotic heart disease of native coronary artery with other forms of angina pectoris: Secondary | ICD-10-CM | POA: Diagnosis not present

## 2021-03-08 DIAGNOSIS — E1159 Type 2 diabetes mellitus with other circulatory complications: Secondary | ICD-10-CM

## 2021-03-08 DIAGNOSIS — I7102 Dissection of abdominal aorta: Secondary | ICD-10-CM | POA: Diagnosis not present

## 2021-03-08 DIAGNOSIS — M791 Myalgia, unspecified site: Secondary | ICD-10-CM

## 2021-03-08 DIAGNOSIS — T466X5A Adverse effect of antihyperlipidemic and antiarteriosclerotic drugs, initial encounter: Secondary | ICD-10-CM

## 2021-03-08 DIAGNOSIS — M7911 Myalgia of mastication muscle: Secondary | ICD-10-CM

## 2021-03-08 DIAGNOSIS — R252 Cramp and spasm: Secondary | ICD-10-CM | POA: Diagnosis not present

## 2021-03-08 DIAGNOSIS — I701 Atherosclerosis of renal artery: Secondary | ICD-10-CM | POA: Diagnosis not present

## 2021-03-08 DIAGNOSIS — E782 Mixed hyperlipidemia: Secondary | ICD-10-CM | POA: Diagnosis not present

## 2021-03-08 DIAGNOSIS — T466X5D Adverse effect of antihyperlipidemic and antiarteriosclerotic drugs, subsequent encounter: Secondary | ICD-10-CM

## 2021-03-08 DIAGNOSIS — E871 Hypo-osmolality and hyponatremia: Secondary | ICD-10-CM | POA: Diagnosis not present

## 2021-03-08 NOTE — Patient Instructions (Addendum)
Medication Instructions:  No changes  If you need a refill on your cardiac medications before your next appointment, please call your pharmacy.    Lab work: CK, CRP, Sed rate, ANA, aldolase For muscle myalgias/cramps  Testing/Procedures: No new testing needed  Follow-Up: At [facility], you and your health needs are our priority.  As part of our continuing mission to provide you with exceptional heart care, we have created designated Provider Care Teams.  These Care Teams include your primary Cardiologist (physician) and Advanced Practice Providers (APPs -  Physician Assistants and Nurse Practitioners) who all work together to provide you with the care you need, when you need it.  You will need a follow up appointment in 6 months  Providers on your designated Care Team:   Nicolasa Ducking, NP Eula Listen, PA-C Cadence Fransico Michael, [place]  COVID-19 Vaccine Information can be found at: [email or web address] For questions related to vaccine distribution or appointments, please email [email or web address] or call [phone].

## 2021-03-10 ENCOUNTER — Other Ambulatory Visit: Payer: Medicare HMO

## 2021-03-10 ENCOUNTER — Other Ambulatory Visit: Payer: Self-pay

## 2021-03-10 ENCOUNTER — Other Ambulatory Visit (INDEPENDENT_AMBULATORY_CARE_PROVIDER_SITE_OTHER): Payer: Medicare HMO

## 2021-03-10 DIAGNOSIS — R252 Cramp and spasm: Secondary | ICD-10-CM

## 2021-03-10 DIAGNOSIS — M7911 Myalgia of mastication muscle: Secondary | ICD-10-CM | POA: Diagnosis not present

## 2021-03-11 LAB — C-REACTIVE PROTEIN: CRP: 1 mg/L (ref 0–10)

## 2021-03-11 LAB — CK: Total CK: 226 U/L — ABNORMAL HIGH (ref 26–161)

## 2021-03-11 LAB — ALDOLASE: Aldolase: 5.1 U/L (ref 3.3–10.3)

## 2021-03-11 LAB — SEDIMENTATION RATE: Sed Rate: 13 mm/hr (ref 0–40)

## 2021-03-11 LAB — ANA: Anti Nuclear Antibody (ANA): NEGATIVE

## 2021-03-15 ENCOUNTER — Telehealth: Payer: Self-pay | Admitting: Cardiovascular Disease

## 2021-03-15 NOTE — Telephone Encounter (Signed)
Patient would like a call to discuss her labwork.

## 2021-03-15 NOTE — Telephone Encounter (Signed)
Return call to Jessica Wheeler, advised results posted to her MyChart, however Dr. Mariah Milling has not reviewed. Preliminary results reviewed by nurse. Jessica Wheeler verbalized understanding, will await for Dr. Mariah Milling to review and post his comment to her MyChart, if anything of concern, she will get a call back from nurse.   Jessica Wheeler thankful for the return call and will await for Dr. Windell Hummingbird results review.

## 2021-03-29 ENCOUNTER — Encounter: Payer: Self-pay | Admitting: Family Medicine

## 2021-03-29 NOTE — Telephone Encounter (Signed)
Ok to send in [place] 100mg  BID prn. Please recommend home COVID test.

## 2021-03-30 MED ORDER — BENZONATATE 100 MG PO CAPS: 100.0000 mg | ORAL_CAPSULE | Freq: Two times a day (BID) | ORAL | 0 refills | Status: DC

## 2021-04-07 ENCOUNTER — Other Ambulatory Visit: Payer: Self-pay | Admitting: Family Medicine

## 2021-04-07 DIAGNOSIS — E1169 Type 2 diabetes mellitus with other specified complication: Secondary | ICD-10-CM

## 2021-04-19 NOTE — Progress Notes (Signed)
Established patient visit   Patient: Jessica Wheeler   DOB: [birth year]   80 y.o. Female  MRN: [id number] Visit Date: 04/20/2021  Today's healthcare provider: Lavon Paganini, MD   Chief Complaint  Patient presents with   URI   I,Sulibeya S Dimas,acting as a scribe for Lavon Paganini, MD.,have documented all relevant documentation on the behalf of Lavon Paganini, MD,as directed by  Lavon Paganini, MD while in the presence of Lavon Paganini, MD.  Subjective    HPI  Upper respiratory symptoms She complains of cough described as productive and productive of white sputum.with no fever, chills, night sweats or weight loss. Onset of symptoms was a few weeks ago and worsening.She is drinking plenty of fluids.  Past history is significant for no history of pneumonia or bronchitis. Patient is non-smoker. Patient reports taking benzonatate full course reports some improvement. Patient also taking Mucinex.   Was doing better and then acutely worse 5 days ago  +runny nose, congestion, SOB.  ---------------------------------------------------------------------------------------------------   Medications: Outpatient Medications Prior to Visit  Medication Sig   Accu-Chek FastClix Lancets MISC TEST FASTING BLOOD SUGAR EVERY DAY   amLODipine (NORVASC) 10 MG tablet Take 10 mg by mouth daily.   aspirin 81 MG EC tablet Take 81 mg by mouth daily.   Blood Glucose Monitoring Suppl (ACCU-CHEK GUIDE) w/Device KIT USE AS DIRECTED TO CHECK FASTING BLOOD SUGAR DAILY   glucose blood (ACCU-CHEK GUIDE) test strip Use as instructed   ibuprofen (ADVIL) 200 MG tablet Take 200 mg by mouth daily at 6 (six) AM.   Lancets Misc. (ACCU-CHEK FASTCLIX LANCET) KIT 1 each by Does not apply route as directed.   latanoprost (XALATAN) 0.005 % ophthalmic solution Place 1 drop into both eyes at bedtime.    losartan (COZAAR) 100 MG tablet TAKE 1 TABLET EVERY DAY   Magnesium 250 MG TABS Take 250 mg by mouth  daily.   metFORMIN (GLUCOPHAGE) 500 MG tablet TAKE 2 TABLETS TWICE DAILY WITH MEALS   vitamin B-12 (CYANOCOBALAMIN) 1000 MCG tablet Take 1,000 mcg by mouth daily.   [DISCONTINUED] benzonatate (TESSALON PERLES) 100 MG capsule Take 1 capsule (100 mg total) by mouth 2 (two) times daily. (Patient not taking: Reported on 04/20/2021)   No facility-administered medications prior to visit.    Review of Systems  Constitutional:  Negative for chills and fever.  HENT:  Positive for congestion.   Respiratory:  Positive for cough. Negative for wheezing.   Cardiovascular:  Negative for chest pain and palpitations.      Objective    BP 129/71 (BP Location: Right Arm, Patient Position: Sitting, Cuff Size: Large)    Pulse 80    Temp 98.4 F (36.9 C) (Oral)    Resp 16    Wt 140 lb (63.5 kg)    SpO2 99%    BMI 24.80 kg/m  BP Readings from Last 3 Encounters:  04/20/21 129/71  03/08/21 (!) 164/70  02/01/21 (!) 146/62   Wt Readings from Last 3 Encounters:  04/20/21 140 lb (63.5 kg)  03/08/21 139 lb 6 oz (63.2 kg)  02/01/21 142 lb (64.4 kg)      Physical Exam Vitals reviewed.  Constitutional:      General: She is not in acute distress.    Appearance: Normal appearance. She is well-developed. She is not diaphoretic.  HENT:     Head: Normocephalic and atraumatic.  Eyes:     General: No scleral icterus.    Conjunctiva/sclera: Conjunctivae normal.  Neck:     Thyroid: No thyromegaly.  Cardiovascular:     Rate and Rhythm: Normal rate and regular rhythm.     Pulses: Normal pulses.     Heart sounds: Normal heart sounds. No murmur heard. Pulmonary:     Effort: Pulmonary effort is normal. No respiratory distress.     Breath sounds: Normal breath sounds. No wheezing, rhonchi or rales.  Musculoskeletal:     Cervical back: Neck supple.     Right lower leg: No edema.     Left lower leg: No edema.  Lymphadenopathy:     Cervical: No cervical adenopathy.  Skin:    General: Skin is warm and dry.      Findings: No rash.  Neurological:     Mental Status: She is alert and oriented to person, place, and time. Mental status is at baseline.  Psychiatric:        Mood and Affect: Mood normal.        Behavior: Behavior normal.      No results found for any visits on 04/20/21.  Assessment & Plan     1. Viral URI with cough 2. Productive cough - symptoms and exam c/w viral URI - no evidence of strep pharyngitis, CAP, AOM, bacterial sinusitis, or other bacterial infection - will check CXR to ensure no occult pneumonia given age and duration of symptoms - may require abx pending CXR results - discussed symptomatic management, natural course, and return precautions  - COVID-19, Flu A+B and RSV - DG Chest 2 View; Future   Meds ordered this encounter  Medications   benzonatate (TESSALON PERLES) 100 MG capsule    Sig: Take 1-2 capsules (100-200 mg total) by mouth 2 (two) times daily.    Dispense:  40 capsule    Refill:  0     Return if symptoms worsen or fail to improve.      I, Lavon Paganini, MD, have reviewed all documentation for this visit. The documentation on 04/20/21 for the exam, diagnosis, procedures, and orders are all accurate and complete.   Mohmmad Saleeby, [name], MD, MPH [facility]

## 2021-04-20 ENCOUNTER — Ambulatory Visit
Admission: RE | Admit: 2021-04-20 | Discharge: 2021-04-20 | Disposition: A | Discharge status: Home / Self Care | Payer: Medicare HMO | Attending: Family Medicine | Admitting: Family Medicine

## 2021-04-20 ENCOUNTER — Ambulatory Visit
Admission: RE | Admit: 2021-04-20 | Discharge: 2021-04-20 | Disposition: A | Discharge status: Home / Self Care | Payer: Medicare HMO | Source: Ambulatory Visit | Attending: Family Medicine | Admitting: Family Medicine

## 2021-04-20 ENCOUNTER — Other Ambulatory Visit: Payer: Self-pay

## 2021-04-20 ENCOUNTER — Encounter: Payer: Self-pay | Admitting: Family Medicine

## 2021-04-20 ENCOUNTER — Ambulatory Visit (INDEPENDENT_AMBULATORY_CARE_PROVIDER_SITE_OTHER): Payer: Medicare HMO | Admitting: Family Medicine

## 2021-04-20 VITALS — BP 129/71 | HR 80 | Temp 98.4°F | Resp 16 | Wt 140.0 lb

## 2021-04-20 DIAGNOSIS — R059 Cough, unspecified: Secondary | ICD-10-CM | POA: Diagnosis not present

## 2021-04-20 DIAGNOSIS — R058 Other specified cough: Secondary | ICD-10-CM

## 2021-04-20 DIAGNOSIS — J069 Acute upper respiratory infection, unspecified: Secondary | ICD-10-CM

## 2021-04-20 MED ORDER — BENZONATATE 100 MG PO CAPS: 100.0000 mg | ORAL_CAPSULE | Freq: Two times a day (BID) | ORAL | 0 refills | Status: DC

## 2021-04-21 LAB — COVID-19, FLU A+B AND RSV
Influenza A, NAA: NOT DETECTED
Influenza B, NAA: NOT DETECTED
RSV, NAA: NOT DETECTED
SARS-CoV-2, NAA: NOT DETECTED

## 2021-04-22 ENCOUNTER — Telehealth: Payer: Self-pay

## 2021-04-22 MED ORDER — AZITHROMYCIN 250 MG PO TABS: ORAL_TABLET | ORAL | 0 refills | Status: AC

## 2021-04-22 NOTE — Telephone Encounter (Signed)
-----   Message from Erasmo Downer, MD sent at 04/22/2021  8:02 AM EST ----- No obvious pneumonia. There is chronic changes in the base of both lungs. If cough continues, can consider Azithromycin.

## 2021-04-22 NOTE — Telephone Encounter (Signed)
Opened in error

## 2021-04-22 NOTE — Telephone Encounter (Signed)
Patient advised. She says she wants to go ahead and start on Azithromycin now since she has been dealing with these symptoms for so long and she still has a cough. Patient says her hand surgery has been put off twice because of symptoms. Patient wants to get her symptoms cleared up so that she can reschedule her surgery.   Pharmacy: [facility] [address])

## 2021-04-28 DIAGNOSIS — H6123 Impacted cerumen, bilateral: Secondary | ICD-10-CM | POA: Diagnosis not present

## 2021-04-28 DIAGNOSIS — H6063 Unspecified chronic otitis externa, bilateral: Secondary | ICD-10-CM | POA: Diagnosis not present

## 2021-05-10 ENCOUNTER — Other Ambulatory Visit: Payer: Self-pay | Admitting: Family Medicine

## 2021-05-10 DIAGNOSIS — I10 Essential (primary) hypertension: Secondary | ICD-10-CM

## 2021-05-10 NOTE — Telephone Encounter (Signed)
Requested Prescriptions  Pending Prescriptions Disp Refills   losartan (COZAAR) 100 MG tablet [Pharmacy Med Name: LOSARTAN POTASSIUM 100 MG Tablet] 90 tablet 1    Sig: TAKE 1 TABLET EVERY DAY     Cardiovascular:  Angiotensin Receptor Blockers Passed - 05/10/2021  5:12 AM      Passed - Cr in normal range and within 180 days    Creatinine  Date Value Ref Range Status  10/01/2011 0.72 0.60 - 1.30 mg/dL Final   Creatinine, Ser  Date Value Ref Range Status  02/03/2021 0.70 0.57 - 1.00 mg/dL Final         Passed - K in normal range and within 180 days    Potassium  Date Value Ref Range Status  02/03/2021 4.7 3.5 - 5.2 mmol/L Final  10/01/2011 3.3 (L) 3.5 - 5.1 mmol/L Final         Passed - Patient is not pregnant      Passed - Last BP in normal range    BP Readings from Last 1 Encounters:  04/20/21 129/71         Passed - Valid encounter within last 6 months    Recent Outpatient Visits          2 weeks ago Viral URI with cough   [facility] [place], Marzella Schlein, MD   3 months ago Type 2 diabetes mellitus with other specified complication, without long-term current use of insulin [facility])   [facility] [place], Marzella Schlein, MD   4 months ago Cough   [facility] Malva Limes, MD   8 months ago Essential hypertension   [facility] Chrismon, Jodell Cipro, PA-C   9 months ago Encounter for annual physical exam   [facility], Marzella Schlein, MD      Future Appointments            In 2 months Bacigalupo, Marzella Schlein, MD [facility]

## 2021-05-17 ENCOUNTER — Other Ambulatory Visit: Payer: Self-pay | Admitting: Family Medicine

## 2021-05-17 DIAGNOSIS — I10 Essential (primary) hypertension: Secondary | ICD-10-CM

## 2021-05-17 NOTE — Telephone Encounter (Signed)
Requested medication (s) are due for refill today - no  Requested medication (s) are on the active medication list -yes- but listed as historical and not at this dose  Future visit scheduled -yes  Last refill: 03/04/21  Notes to clinic: Request RF: medication not current at this dose on medication list, historical at increased dose- sent for review   Requested Prescriptions  Pending Prescriptions Disp Refills   amLODipine (NORVASC) 5 MG tablet [Pharmacy Med Name: AMLODIPINE BESYLATE 5 MG Tablet] 90 tablet 1    Sig: TAKE 1 TABLET EVERY DAY     Cardiovascular:  Calcium Channel Blockers Passed - 05/17/2021 12:22 PM      Passed - Last BP in normal range    BP Readings from Last 1 Encounters:  04/20/21 129/71          Passed - Valid encounter within last 6 months    Recent Outpatient Visits           3 weeks ago Viral URI with cough   [facility] [place], Marzella Schlein, MD   3 months ago Type 2 diabetes mellitus with other specified complication, without long-term current use of insulin [facility])   [facility] [place], Marzella Schlein, MD   4 months ago Cough   [facility] Malva Limes, MD   8 months ago Essential hypertension   [facility], Jodell Cipro, PA-C   10 months ago Encounter for annual physical exam   [facility], Marzella Schlein, MD       Future Appointments             In 2 months Bacigalupo, Marzella Schlein, MD [facility]               Requested Prescriptions  Pending Prescriptions Disp Refills   amLODipine (NORVASC) 5 MG tablet [Pharmacy Med Name: AMLODIPINE BESYLATE 5 MG Tablet] 90 tablet 1    Sig: TAKE 1 TABLET EVERY DAY     Cardiovascular:  Calcium Channel Blockers Passed - 05/17/2021 12:22 PM      Passed - Last BP in normal range    BP Readings from Last 1 Encounters:  04/20/21 129/71          Passed - Valid encounter within last 6 months    Recent  Outpatient Visits           3 weeks ago Viral URI with cough   [facility] [place], Marzella Schlein, MD   3 months ago Type 2 diabetes mellitus with other specified complication, without long-term current use of insulin [facility])   [facility] [place], Marzella Schlein, MD   4 months ago Cough   [facility] Malva Limes, MD   8 months ago Essential hypertension   [facility] Chrismon, Jodell Cipro, PA-C   10 months ago Encounter for annual physical exam   [facility], Marzella Schlein, MD       Future Appointments             In 2 months Bacigalupo, Marzella Schlein, MD [facility]

## 2021-05-26 DIAGNOSIS — G5601 Carpal tunnel syndrome, right upper limb: Secondary | ICD-10-CM | POA: Diagnosis not present

## 2021-07-01 ENCOUNTER — Other Ambulatory Visit: Payer: Self-pay

## 2021-07-01 ENCOUNTER — Ambulatory Visit: Payer: Self-pay | Admitting: *Deleted

## 2021-07-01 ENCOUNTER — Encounter: Payer: Self-pay | Admitting: Family Medicine

## 2021-07-01 ENCOUNTER — Ambulatory Visit (INDEPENDENT_AMBULATORY_CARE_PROVIDER_SITE_OTHER): Payer: Medicare HMO | Admitting: Family Medicine

## 2021-07-01 VITALS — BP 146/70 | HR 85 | Temp 98.1°F | Resp 14 | Ht 63.0 in | Wt 144.1 lb

## 2021-07-01 DIAGNOSIS — E1159 Type 2 diabetes mellitus with other circulatory complications: Secondary | ICD-10-CM

## 2021-07-01 DIAGNOSIS — I152 Hypertension secondary to endocrine disorders: Secondary | ICD-10-CM | POA: Diagnosis not present

## 2021-07-01 DIAGNOSIS — N3090 Cystitis, unspecified without hematuria: Secondary | ICD-10-CM

## 2021-07-01 MED ORDER — CEPHALEXIN 500 MG PO CAPS: 500.0000 mg | ORAL_CAPSULE | Freq: Three times a day (TID) | ORAL | 0 refills | Status: AC

## 2021-07-01 NOTE — Telephone Encounter (Signed)
Summary: poss uti  ? Pt is going to see Dr. B at 11:20   She decided to go ahead and be seen  ? ?----- Message from Otis Brace sent at 07/01/2021 10:32 AM EST -----  ?Pt called saying she thinks she has a UTI and she starting taking uristat and wants to know if the will effect a UA or UC. She said she wants to ask that question before making an appt  ? ?CB#  [phone]   ?  ? ?Reason for Disposition ? General information question, no triage required and triager able to answer question ? ?Answer Assessment - Initial Assessment Questions ?1. REASON FOR CALL or QUESTION: "What is your reason for calling today?" or "How can I best help you?" or "What question do you have that I can help answer?" ?    Returned call to patient- advised medication would not effect urine culture results- and please keep appointment. She states she will. ? ?Protocols used: Information Only Call - No Triage-A-AH ? ?

## 2021-07-01 NOTE — Progress Notes (Signed)
Acute Office Visit  Subjective:    Patient ID: Jessica Wheeler, female    DOB: [birth year], 81 y.o.   MRN: [id number]  Chief Complaint  Patient presents with   Urinary Frequency    HPI Patient is in today for urinary symptoms Urinary symptoms  She reports new onset urinary frequency. The current episode started a few days ago and is worsening. Patient states symptoms are 4/10 in intensity, occurring constantly. She  has been recently treated for similar symptoms.    Associated symptoms: Yes abdominal pain Yes back pain  No chills No constipation  No cramping No diarrhea  No discharge No fever  No hematuria Yes nausea  No vomiting    ---------------------------------------------------------------------------------------   Past Medical History:  Diagnosis Date   Allergy    Arthritis    Bundle branch block, right    C7 cervical fracture (HCC)    Diabetes mellitus    Diffuse cystic mastopathy    Dissection, aorta (HCC)    Dr.Dew follows   Diverticulosis    Family history of malignant neoplasm of gastrointestinal tract    Glaucoma 2005   Heart murmur    History of bronchitis    History of pancreatitis 2005   Hyperlipidemia    Hypertension 1982   Personal history of tobacco use, presenting hazards to health    RBBB    Ulcer     Past Surgical History:  Procedure Laterality Date   ABDOMINAL HYSTERECTOMY  1991   ANTERIOR CERVICAL CORPECTOMY N/A 03/30/2020   Procedure: CORPECTOMY CERVICAL FOUR - CERVICAL FIVE;  Surgeon: Newman Pies, MD;  Location: [place];  Service: Neurosurgery;  Laterality: N/A;  anterior   APPENDECTOMY     BACK SURGERY  1982   for lumbar ruptured disc. No hardware, no discectomy, no fusion   BLADDER SURGERY     BREAST BIOPSY Right 1980's   benign   BREAST EXCISIONAL BIOPSY Right 1980s   benign   BREAST SURGERY     biopsy   CARDIAC CATHETERIZATION     Dr. Ubaldo Glassing, few years ago   CATARACT EXTRACTION W/PHACO Left 08/31/2015   Procedure:  CATARACT EXTRACTION PHACO AND INTRAOCULAR LENS PLACEMENT ([place]);  Surgeon: Ronnell Freshwater, MD;  Location: [place];  Service: Ophthalmology;  Laterality: Left;  DIABETIC - oral meds   CATARACT EXTRACTION W/PHACO Right 10/12/2015   Procedure: CATARACT EXTRACTION PHACO AND INTRAOCULAR LENS PLACEMENT ([place]) right eye;  Surgeon: Ronnell Freshwater, MD;  Location: [place];  Service: Ophthalmology;  Laterality: Right;  DIABETIC - oral meds   CHOLECYSTECTOMY  1994   COLONOSCOPY  2008,02/22/2012   Dr. Sankar-2013   COLONOSCOPY WITH PROPOFOL N/A 04/04/2017   Procedure: COLONOSCOPY WITH PROPOFOL;  Surgeon: Christene Lye, MD;  Location: [facility] ENDOSCOPY;  Service: Endoscopy;  Laterality: N/A;   POSTERIOR CERVICAL FUSION/FORAMINOTOMY N/A 04/01/2020   Procedure: POSTERIOR CERVICAL FUSION/FORAMINOTOMY CERVICAL FOUR- CERVICAL SIX;  Surgeon: Newman Pies, MD;  Location: [place];  Service: Neurosurgery;  Laterality: N/A;   REFRACTIVE SURGERY      Family History  Problem Relation Age of Onset   Diabetes Mother    Hypertension Mother    Anxiety disorder Mother    Heart disease Father        rheumatic heart disease   Heart attack Father    Rheum arthritis Father    Colon cancer Brother    Hypertension Maternal Grandmother    Hypertension Maternal Grandfather    Breast cancer Maternal Aunt  44   Breast cancer Cousin 44    Social History   Socioeconomic History   Marital status: Widowed    Spouse name: Not on file   Number of children: 2   Years of education: 14   Highest education level: Associate degree: academic program  Occupational History   Occupation: Retired  Tobacco Use   Smoking status: Former    Packs/day: 1.00    Years: 15.00    Pack years: 15.00    Types: Cigarettes    Quit date: 08/03/2003    Years since quitting: 17.9   Smokeless tobacco: Never  Vaping Use   Vaping Use: Never used  Substance and Sexual Activity   Alcohol use: No    Drug use: No   Sexual activity: Not on file  Other Topics Concern   Not on file  Social History Narrative   Not on file   Social Determinants of Health   Financial Resource Strain: Low Risk    Difficulty of Paying Living Expenses: Not very hard  Food Insecurity: No Food Insecurity   Worried About Charity fundraiser in the Last Year: Never true   [place] in the Last Year: Never true  Transportation Needs: No Transportation Needs   Lack of Transportation (Medical): No   Lack of Transportation (Non-Medical): No  Physical Activity: Insufficiently Active   Days of Exercise per Week: 4 days   Minutes of Exercise per Session: 30 min  Stress: Stress Concern Present   Feeling of Stress : To some extent  Social Connections: Socially Isolated   Frequency of Communication with Friends and Family: More than three times a week   Frequency of Social Gatherings with Friends and Family: Once a week   Attends Religious Services: Never   Marine scientist or Organizations: No   Attends Archivist Meetings: Never   Marital Status: Widowed  Human resources officer Violence: Not At Risk   Fear of Current or Ex-Partner: No   Emotionally Abused: No   Physically Abused: No   Sexually Abused: No    Outpatient Medications Prior to Visit  Medication Sig Dispense Refill   Accu-Chek FastClix Lancets MISC TEST FASTING BLOOD SUGAR EVERY DAY 102 each 3   amLODipine (NORVASC) 5 MG tablet TAKE 1 TABLET EVERY DAY 90 tablet 1   aspirin 81 MG EC tablet Take 81 mg by mouth daily.     benzonatate (TESSALON PERLES) 100 MG capsule Take 1-2 capsules (100-200 mg total) by mouth 2 (two) times daily. 40 capsule 0   Blood Glucose Monitoring Suppl (ACCU-CHEK GUIDE) w/Device KIT USE AS DIRECTED TO CHECK FASTING BLOOD SUGAR DAILY 1 kit 0   glucose blood (ACCU-CHEK GUIDE) test strip Use as instructed 100 each 12   ibuprofen (ADVIL) 200 MG tablet Take 200 mg by mouth daily at 6 (six) AM.     Lancets Misc.  (ACCU-CHEK FASTCLIX LANCET) KIT 1 each by Does not apply route as directed. 1 kit 1   latanoprost (XALATAN) 0.005 % ophthalmic solution Place 1 drop into both eyes at bedtime.   3   losartan (COZAAR) 100 MG tablet TAKE 1 TABLET EVERY DAY 90 tablet 0   Magnesium 250 MG TABS Take 250 mg by mouth daily.     metFORMIN (GLUCOPHAGE) 500 MG tablet TAKE 2 TABLETS TWICE DAILY WITH MEALS 360 tablet 1   vitamin B-12 (CYANOCOBALAMIN) 1000 MCG tablet Take 1,000 mcg by mouth daily.     amLODipine (NORVASC)  10 MG tablet Take 10 mg by mouth daily. (Patient not taking: Reported on 07/01/2021)     No facility-administered medications prior to visit.    Allergies  Allergen Reactions   Beta Adrenergic Blockers     Chest pressure/difficulty breathing   Jardiance [Empagliflozin]     Muscle cramps   Levaquin [Levofloxacin In D5w]     Low blood sugar; does take this when she needs it. She just makes sure to control her BS to avoid hypoglycemia.   Lisinopril Cough   Zetia [Ezetimibe]     Lethargic    Prevnar [Pneumococcal 13-Val Conj Vacc] Rash    Joint Pain   Sulfa Antibiotics Rash    "makes my skin raw"    Review of Systems  Gastrointestinal:  Positive for nausea.  Endocrine: Positive for polyuria.  Genitourinary:  Positive for dysuria and frequency.  All other systems reviewed and are negative.     Objective:    Physical Exam Vitals reviewed.  Constitutional:      General: She is not in acute distress.    Appearance: Normal appearance. She is well-developed. She is not diaphoretic.  HENT:     Head: Normocephalic and atraumatic.  Eyes:     General: No scleral icterus.    Conjunctiva/sclera: Conjunctivae normal.  Neck:     Thyroid: No thyromegaly.  Cardiovascular:     Rate and Rhythm: Normal rate and regular rhythm.     Pulses: Normal pulses.     Heart sounds: Normal heart sounds. No murmur heard. Pulmonary:     Effort: Pulmonary effort is normal. No respiratory distress.     Breath  sounds: Normal breath sounds. No wheezing, rhonchi or rales.  Abdominal:     General: There is no distension.     Palpations: Abdomen is soft.     Tenderness: There is abdominal tenderness (suprapubic). There is no right CVA tenderness or left CVA tenderness.  Musculoskeletal:     Cervical back: Neck supple.     Right lower leg: No edema.     Left lower leg: No edema.  Lymphadenopathy:     Cervical: No cervical adenopathy.  Skin:    General: Skin is warm and dry.     Findings: No rash.  Neurological:     Mental Status: She is alert and oriented to person, place, and time. Mental status is at baseline.  Psychiatric:        Mood and Affect: Mood normal.        Behavior: Behavior normal.    BP (!) 159/71 (BP Location: Left Arm, Patient Position: Sitting, Cuff Size: Normal)    Pulse 85    Temp 98.1 F (36.7 C) (Oral)    Resp 14    Ht _0  (1.6 m)    Wt 144 lb 1.6 oz (65.4 kg)    BMI 25.53 kg/m  Wt Readings from Last 3 Encounters:  07/01/21 144 lb 1.6 oz (65.4 kg)  04/20/21 140 lb (63.5 kg)  03/08/21 139 lb 6 oz (63.2 kg)    Health Maintenance Due  Topic Date Due   Zoster Vaccines- Shingrix (1 of 2) Never done   COVID-19 Vaccine (4 - Booster for Moderna series) 05/12/2020    There are no preventive care reminders to display for this patient.   Lab Results  Component Value Date   TSH 3.880 12/11/2017   Lab Results  Component Value Date   WBC 8.3 09/10/2020   HGB 13.2 09/10/2020  HCT 38.8 09/10/2020   MCV 90 09/10/2020   PLT 287 09/10/2020   Lab Results  Component Value Date   NA 134 02/03/2021   K 4.7 02/03/2021   CO2 22 02/03/2021   GLUCOSE 142 (H) 02/03/2021   BUN 13 02/03/2021   CREATININE 0.70 02/03/2021   BILITOT 0.4 02/03/2021   ALKPHOS 53 02/03/2021   AST 27 02/03/2021   ALT 25 02/03/2021   PROT 7.1 02/03/2021   ALBUMIN 4.7 (H) 02/03/2021   CALCIUM 9.8 02/03/2021   ANIONGAP 13 03/31/2020   EGFR 87 02/03/2021   GFR 84.79 05/09/2016   Lab Results   Component Value Date   CHOL 187 02/03/2021   Lab Results  Component Value Date   HDL 84 02/03/2021   Lab Results  Component Value Date   LDLCALC 90 02/03/2021   Lab Results  Component Value Date   TRIG 70 02/03/2021   Lab Results  Component Value Date   CHOLHDL 2.2 02/03/2021   Lab Results  Component Value Date   HGBA1C 6.6 (A) 01/29/2021       Assessment & Plan:   1. Cystitis - Symptoms consistent with UTI - UA unreliable as taking uristat -No systemic symptoms or signs of pyelonephritis - will send micro to check for hematuria -Will start treatment with 5day course of  Keflex after reviewing previous urine culture and allergies -We will send urine culture to confirm sensitivities -Discussed return precautions  - Urinalysis, microscopic only - Urine Culture   2. Hypertension associated with diabetes ([place]) - slightly elevated here today - states home BPs are better - will recheck at upcoming CPE   I, Lavon Paganini, MD, have reviewed all documentation for this visit. The documentation on 07/01/21 for the exam, diagnosis, procedures, and orders are all accurate and complete.   Jessica Wheeler, [name], MD, MPH [facility]

## 2021-07-02 LAB — URINALYSIS, MICROSCOPIC ONLY
Casts: NONE SEEN /lpf
WBC, UA: >30 /hpf — AB (ref 0–5)

## 2021-07-07 LAB — URINE CULTURE

## 2021-07-08 ENCOUNTER — Other Ambulatory Visit: Payer: Self-pay

## 2021-07-08 NOTE — Telephone Encounter (Signed)
-----   Message from Cleotilde Neer, RN sent at 07/08/2021 12:29 PM EST ----- ?Pt given lab results per notes of Dr. Beryle Flock from 07/07/21 on 07/08/21. Pt verbalized understanding to start cipro 250 mg BID for 5 days and is requesting to get Rx for yeast infection because she knows medication will cause yeast infection . ?  ?

## 2021-07-08 NOTE — Telephone Encounter (Signed)
requesting to get Rx for yeast infection  ?

## 2021-07-08 NOTE — Telephone Encounter (Signed)
Ok to send in diflucan 1 pill once #1 r0 in addition to the Cipro

## 2021-07-09 DIAGNOSIS — Z7984 Long term (current) use of oral hypoglycemic drugs: Secondary | ICD-10-CM | POA: Diagnosis not present

## 2021-07-09 DIAGNOSIS — Z961 Presence of intraocular lens: Secondary | ICD-10-CM | POA: Diagnosis not present

## 2021-07-09 DIAGNOSIS — H401131 Primary open-angle glaucoma, bilateral, mild stage: Secondary | ICD-10-CM | POA: Diagnosis not present

## 2021-07-09 DIAGNOSIS — E119 Type 2 diabetes mellitus without complications: Secondary | ICD-10-CM | POA: Diagnosis not present

## 2021-07-09 DIAGNOSIS — H43813 Vitreous degeneration, bilateral: Secondary | ICD-10-CM | POA: Diagnosis not present

## 2021-07-09 DIAGNOSIS — H353132 Nonexudative age-related macular degeneration, bilateral, intermediate dry stage: Secondary | ICD-10-CM | POA: Diagnosis not present

## 2021-07-09 MED ORDER — CIPROFLOXACIN HCL 250 MG PO TABS: 250.0000 mg | ORAL_TABLET | Freq: Two times a day (BID) | ORAL | 0 refills | Status: DC

## 2021-07-09 MED ORDER — FLUCONAZOLE 150 MG PO TABS: 150.0000 mg | ORAL_TABLET | Freq: Once | ORAL | 0 refills | Status: AC

## 2021-07-13 ENCOUNTER — Other Ambulatory Visit: Payer: Self-pay | Admitting: Family Medicine

## 2021-07-13 DIAGNOSIS — R200 Anesthesia of skin: Secondary | ICD-10-CM | POA: Insufficient documentation

## 2021-07-13 DIAGNOSIS — M48061 Spinal stenosis, lumbar region without neurogenic claudication: Secondary | ICD-10-CM | POA: Insufficient documentation

## 2021-07-13 DIAGNOSIS — M488X2 Other specified spondylopathies, cervical region: Secondary | ICD-10-CM | POA: Insufficient documentation

## 2021-07-13 DIAGNOSIS — G5600 Carpal tunnel syndrome, unspecified upper limb: Secondary | ICD-10-CM | POA: Insufficient documentation

## 2021-07-13 NOTE — Telephone Encounter (Signed)
Requested medication (s) are due for refill today: No ? ?Requested medication (s) are on the active medication list: No ? ?Last refill:  07/09/21 ? ?Future visit scheduled: Yes ? ?Notes to clinic:  See request. ? ? ? ?Requested Prescriptions  ?Pending Prescriptions Disp Refills  ? fluconazole (DIFLUCAN) 150 MG tablet [Pharmacy Med Name: Fluconazole 150 MG Oral Tablet] 2 tablet 0  ?  Sig: TAKE 1 TABLET BY MOUTH ONCE FOR 1 DOSE. MAY REPEAT IN 3 DAYS IF SYMPTOMS NOT RESOLVED  ?  ? Off-Protocol Failed - 07/13/2021  5:58 AM  ?  ?  Failed - Medication not assigned to a protocol, review manually.  ?  ?  Passed - Valid encounter within last 12 months  ?  Recent Outpatient Visits   ? ?      ? 1 week ago Cystitis  ? [facility] [place], Marzella Schlein, MD  ? 2 months ago Viral URI with cough  ? [facility] [place], Marzella Schlein, MD  ? 5 months ago Type 2 diabetes mellitus with other specified complication, without long-term current use of insulin (HCC)  ? [facility] [place], Marzella Schlein, MD  ? 6 months ago Cough  ? [facility] Malva Limes, MD  ? 10 months ago Essential hypertension  ? [facility] [place], Jodell Cipro, PA-C  ? ?  ?  ?Future Appointments   ? ?        ? In 1 week Bacigalupo, Marzella Schlein, MD [facility], [facility]  ? In 1 month Gollan, Tollie Pizza, MD [id number]  ? ?  ? ?  ?  ?  ? ?

## 2021-07-14 ENCOUNTER — Ambulatory Visit (INDEPENDENT_AMBULATORY_CARE_PROVIDER_SITE_OTHER): Payer: Medicare HMO

## 2021-07-14 VITALS — Wt 144.0 lb

## 2021-07-14 DIAGNOSIS — Z0000 Encounter for general adult medical examination without abnormal findings: Secondary | ICD-10-CM

## 2021-07-14 NOTE — Patient Instructions (Signed)
Jessica Wheeler , ?Thank you for taking time to come for your Medicare Wellness Visit. I appreciate your ongoing commitment to your health goals. Please review the following plan we discussed and let me know if I can assist you in the future.  ? ?Screening recommendations/referrals: ?Colonoscopy: aged out ?Mammogram: aged out ?Bone Density: 06/06/16, declined ?Recommended yearly ophthalmology/optometry visit for glaucoma screening and checkup ?Recommended yearly dental visit for hygiene and checkup ? ?Vaccinations: ?Influenza vaccine: 01/29/21 ?Pneumococcal vaccine: 02/26/14 ?Tdap vaccine: n/d ?Shingles vaccine: Zostavax 06/14/11   ?Covid-19:05/15/19, 06/12/19, 03/17/20 ? ?Advanced directives: yes, requested copy ? ?Conditions/risks identified: no ? ?Next appointment: Follow up in one year for your annual wellness visit - 07/19/22 @ 10:20am by phone ? ? ?Preventive Care 26 Years and Older, Female ?Preventive care refers to lifestyle choices and visits with your health care provider that can promote health and wellness. ?What does preventive care include? ?A yearly physical exam. This is also called an annual well check. ?Dental exams once or twice a year. ?Routine eye exams. Ask your health care provider how often you should have your eyes checked. ?Personal lifestyle choices, including: ?Daily care of your teeth and gums. ?Regular physical activity. ?Eating a healthy diet. ?Avoiding tobacco and drug use. ?Limiting alcohol use. ?Practicing safe sex. ?Taking low-dose aspirin every day. ?Taking vitamin and mineral supplements as recommended by your health care provider. ?What happens during an annual well check? ?The services and screenings done by your health care provider during your annual well check will depend on your age, overall health, lifestyle risk factors, and family history of disease. ?Counseling  ?Your health care provider may ask you questions about your: ?Alcohol use. ?Tobacco use. ?Drug use. ?Emotional  well-being. ?Home and relationship well-being. ?Sexual activity. ?Eating habits. ?History of falls. ?Memory and ability to understand (cognition). ?Work and work Astronomer. ?Reproductive health. ?Screening  ?You may have the following tests or measurements: ?Height, weight, and BMI. ?Blood pressure. ?Lipid and cholesterol levels. These may be checked every 5 years, or more frequently if you are over 74 years old. ?Skin check. ?Lung cancer screening. You may have this screening every year starting at age 50 if you have a 30-pack-year history of smoking and currently smoke or have quit within the past 15 years. ?Fecal occult blood test (FOBT) of the stool. You may have this test every year starting at age 50. ?Flexible sigmoidoscopy or colonoscopy. You may have a sigmoidoscopy every 5 years or a colonoscopy every 10 years starting at age 3. ?Hepatitis C blood test. ?Hepatitis B blood test. ?Sexually transmitted disease (STD) testing. ?Diabetes screening. This is done by checking your blood sugar (glucose) after you have not eaten for a while (fasting). You may have this done every 1-3 years. ?Bone density scan. This is done to screen for osteoporosis. You may have this done starting at age 63. ?Mammogram. This may be done every 1-2 years. Talk to your health care provider about how often you should have regular mammograms. ?Talk with your health care provider about your test results, treatment options, and if necessary, the need for more tests. ?Vaccines  ?Your health care provider may recommend certain vaccines, such as: ?Influenza vaccine. This is recommended every year. ?Tetanus, diphtheria, and acellular pertussis (Tdap, Td) vaccine. You may need a Td booster every 10 years. ?Zoster vaccine. You may need this after age 1. ?Pneumococcal 13-valent conjugate (PCV13) vaccine. One dose is recommended after age 73. ?Pneumococcal polysaccharide (PPSV23) vaccine. One dose is recommended after age  71. ?Talk to your  health care provider about which screenings and vaccines you need and how often you need them. ?This information is not intended to replace advice given to you by your health care provider. Make sure you discuss any questions you have with your health care provider. ?Document Released: 05/15/2015 Document Revised: 01/06/2016 Document Reviewed: 02/17/2015 ?Elsevier Interactive Patient Education ? 2017 [place]. ? ?Fall Prevention in the Home ?Falls can cause injuries. They can happen to people of all ages. There are many things you can do to make your home safe and to help prevent falls. ?What can I do on the outside of my home? ?Regularly fix the edges of walkways and driveways and fix any cracks. ?Remove anything that might make you trip as you walk through a door, such as a raised step or threshold. ?Trim any bushes or trees on the path to your home. ?Use bright outdoor lighting. ?Clear any walking paths of anything that might make someone trip, such as rocks or tools. ?Regularly check to see if handrails are loose or broken. Make sure that both sides of any steps have handrails. ?Any raised decks and porches should have guardrails on the edges. ?Have any leaves, snow, or ice cleared regularly. ?Use sand or salt on walking paths during winter. ?Clean up any spills in your garage right away. This includes oil or grease spills. ?What can I do in the bathroom? ?Use night lights. ?Install grab bars by the toilet and in the tub and shower. Do not use towel bars as grab bars. ?Use non-skid mats or decals in the tub or shower. ?If you need to sit down in the shower, use a plastic, non-slip stool. ?Keep the floor dry. Clean up any water that spills on the floor as soon as it happens. ?Remove soap buildup in the tub or shower regularly. ?Attach bath mats securely with double-sided non-slip rug tape. ?Do not have throw rugs and other things on the floor that can make you trip. ?What can I do in the bedroom? ?Use night  lights. ?Make sure that you have a light by your bed that is easy to reach. ?Do not use any sheets or blankets that are too big for your bed. They should not hang down onto the floor. ?Have a firm chair that has side arms. You can use this for support while you get dressed. ?Do not have throw rugs and other things on the floor that can make you trip. ?What can I do in the kitchen? ?Clean up any spills right away. ?Avoid walking on wet floors. ?Keep items that you use a lot in easy-to-reach places. ?If you need to reach something above you, use a strong step stool that has a grab bar. ?Keep electrical cords out of the way. ?Do not use floor polish or wax that makes floors slippery. If you must use wax, use non-skid floor wax. ?Do not have throw rugs and other things on the floor that can make you trip. ?What can I do with my stairs? ?Do not leave any items on the stairs. ?Make sure that there are handrails on both sides of the stairs and use them. Fix handrails that are broken or loose. Make sure that handrails are as long as the stairways. ?Check any carpeting to make sure that it is firmly attached to the stairs. Fix any carpet that is loose or worn. ?Avoid having throw rugs at the top or bottom of the stairs. If you do have  throw rugs, attach them to the floor with carpet tape. ?Make sure that you have a light switch at the top of the stairs and the bottom of the stairs. If you do not have them, ask someone to add them for you. ?What else can I do to help prevent falls? ?Wear shoes that: ?Do not have high heels. ?Have rubber bottoms. ?Are comfortable and fit you well. ?Are closed at the toe. Do not wear sandals. ?If you use a stepladder: ?Make sure that it is fully opened. Do not climb a closed stepladder. ?Make sure that both sides of the stepladder are locked into place. ?Ask someone to hold it for you, if possible. ?Clearly mark and make sure that you can see: ?Any grab bars or handrails. ?First and last  steps. ?Where the edge of each step is. ?Use tools that help you move around (mobility aids) if they are needed. These include: ?Canes. ?Walkers. ?Scooters. ?Crutches. ?Turn on the lights when you go into a dark area. Repl

## 2021-07-14 NOTE — Progress Notes (Signed)
?Virtual Visit via Telephone Note ? ?I connected with  Jessica Wheeler on 07/14/21 at 10:20 AM EDT by telephone and verified that I am speaking with the correct person using two identifiers. ? ?Location: ?Patient: home ?Provider: [facility] ?Persons participating in the virtual visit: patient/Nurse Health Advisor ?  ?I discussed the limitations, risks, security and privacy concerns of performing an evaluation and management service by telephone and the availability of in person appointments. The patient expressed understanding and agreed to proceed. ? ?Interactive audio and video telecommunications were attempted between this nurse and patient, however failed, due to patient having technical difficulties OR patient did not have access to video capability.  We continued and completed visit with audio only. ? ?Some vital signs may be absent or patient reported.  ? ?Jessica David, LPN ? ?Subjective:  ? Jessica Wheeler is a 81 y.o. female who presents for Medicare Annual (Subsequent) preventive examination. ? ?Review of Systems    ? ?  ? ?   ?Objective:  ?  ?Today's Vitals  ? 07/14/21 1022  ?Weight: 144 lb (65.3 kg)  ? ?Body mass index is 25.51 kg/m?. ? ?Advanced Directives 07/08/2020 03/30/2020 03/24/2020 12/10/2019 09/21/2019 07/03/2019 07/02/2018  ?Does Patient Have a Medical Advance Directive? Yes Yes Yes Yes No Yes Yes  ?Type of Paramedic of [place];Living will [place];Living will [place];Living will Living will - [place];Living will [place];Living will  ?Does patient want to make changes to medical advance directive? - No - Patient declined - - - - -  ?Copy of [place] in Chart? No - copy requested - - - - No - copy requested No - copy requested  ?Pre-existing out of facility DNR order (yellow form or pink MOST form) - - - - - - -  ? ? ?Current Medications (verified) ?Outpatient Encounter Medications  as of 07/14/2021  ?Medication Sig  ? HYDROcodone-acetaminophen (NORCO/VICODIN) 5-325 MG tablet Take by mouth.  ? Accu-Chek FastClix Lancets MISC TEST FASTING BLOOD SUGAR EVERY DAY  ? amLODipine (NORVASC) 5 MG tablet TAKE 1 TABLET EVERY DAY  ? aspirin 81 MG EC tablet Take 81 mg by mouth daily.  ? benzonatate (TESSALON PERLES) 100 MG capsule Take 1-2 capsules (100-200 mg total) by mouth 2 (two) times daily.  ? Blood Glucose Monitoring Suppl (ACCU-CHEK GUIDE) w/Device KIT USE AS DIRECTED TO CHECK FASTING BLOOD SUGAR DAILY  ? ciprofloxacin (CIPRO) 250 MG tablet Take 1 tablet (250 mg total) by mouth 2 (two) times daily.  ? cyclobenzaprine (FLEXERIL) 5 MG tablet Take 5 mg by mouth 3 (three) times daily as needed.  ? fluconazole (DIFLUCAN) 150 MG tablet Take by mouth.  ? glucose blood (ACCU-CHEK GUIDE) test strip Use as instructed  ? hydrochlorothiazide (HYDRODIURIL) 25 MG tablet Take 25 mg by mouth daily.  ? HYDROcodone-acetaminophen (NORCO/VICODIN) 5-325 MG tablet Take 1 tablet by mouth every 4 (four) hours as needed.  ? ibuprofen (ADVIL) 200 MG tablet Take 200 mg by mouth daily at 6 (six) AM.  ? Lancets Misc. (ACCU-CHEK FASTCLIX LANCET) KIT 1 each by Does not apply route as directed.  ? latanoprost (XALATAN) 0.005 % ophthalmic solution Place 1 drop into both eyes at bedtime.   ? losartan (COZAAR) 100 MG tablet TAKE 1 TABLET EVERY DAY  ? Magnesium 250 MG TABS Take 250 mg by mouth daily.  ? metFORMIN (GLUCOPHAGE) 500 MG tablet TAKE 2 TABLETS TWICE DAILY WITH  MEALS  ? neomycin-polymyxin-hydrocortisone (CORTISPORIN) OTIC solution SMARTSIG:3-4 Drop(s) In Ear(s) 3 Times Daily PRN  ? vitamin B-12 (CYANOCOBALAMIN) 1000 MCG tablet Take 1,000 mcg by mouth daily.  ? ?No facility-administered encounter medications on file as of 07/14/2021.  ? ? ?Allergies (verified) ?Beta adrenergic blockers, Jardiance [empagliflozin], Levaquin [levofloxacin in d5w], Lisinopril, Zetia [ezetimibe], Prevnar [pneumococcal 13-val conj vacc], and Sulfa  antibiotics  ? ?History: ?Past Medical History:  ?Diagnosis Date  ? Allergy   ? Arthritis   ? Bundle branch block, right   ? C7 cervical fracture ([place])   ? Diabetes mellitus   ? Diffuse cystic mastopathy   ? Dissection, aorta (HCC)   ? Dr.Dew follows  ? Diverticulosis   ? Family history of malignant neoplasm of gastrointestinal tract   ? Glaucoma 2005  ? Heart murmur   ? History of bronchitis   ? History of pancreatitis 2005  ? Hyperlipidemia   ? Hypertension 1982  ? Personal history of tobacco use, presenting hazards to health   ? RBBB   ? Ulcer   ? ?Past Surgical History:  ?Procedure Laterality Date  ? ABDOMINAL HYSTERECTOMY  1991  ? ANTERIOR CERVICAL CORPECTOMY N/A 03/30/2020  ? Procedure: CORPECTOMY CERVICAL FOUR - CERVICAL FIVE;  Surgeon: Newman Pies, MD;  Location: [facility];  Service: Neurosurgery;  Laterality: N/A;  anterior  ? APPENDECTOMY    ? [place]  ? for lumbar ruptured disc. No hardware, no discectomy, no fusion  ? BLADDER SURGERY    ? BREAST BIOPSY Right 1980's  ? benign  ? BREAST EXCISIONAL BIOPSY Right 1980s  ? benign  ? BREAST SURGERY    ? biopsy  ? CARDIAC CATHETERIZATION    ? Dr. Ubaldo Glassing, few years ago  ? CATARACT EXTRACTION W/PHACO Left 08/31/2015  ? Procedure: CATARACT EXTRACTION PHACO AND INTRAOCULAR LENS PLACEMENT (IOC);  Surgeon: Ronnell Freshwater, MD;  Location: [facility];  Service: Ophthalmology;  Laterality: Left;  DIABETIC - oral meds  ? CATARACT EXTRACTION W/PHACO Right 10/12/2015  ? Procedure: CATARACT EXTRACTION PHACO AND INTRAOCULAR LENS PLACEMENT ([place]) right eye;  Surgeon: Ronnell Freshwater, MD;  Location: [facility];  Service: Ophthalmology;  Laterality: Right;  DIABETIC - oral meds  ? CHOLECYSTECTOMY  1994  ? COLONOSCOPY  2008,02/22/2012  ? Dr. Sankar-2013  ? COLONOSCOPY WITH PROPOFOL N/A 04/04/2017  ? Procedure: COLONOSCOPY WITH PROPOFOL;  Surgeon: Christene Lye, MD;  [facility] END[facility]COPY;  Service: Endoscopy;  Laterality: N/A;   ? POSTERIOR CERVICAL FUSION/FORAMINOTOMY N/A 04/01/2020  ? Procedure: POSTERIOR CERVICAL FUSION/FORAMINOTOMY CERVICAL FOUR- CERVICAL SIX;  Surgeon: Newman Pies, MD;  Location: [facility];  Service: Neurosurgery;  Laterality: N/A;  ? REFRACTIVE SURGERY    ? ?Family History  ?Problem Relation Age of Onset  ? Diabetes Mother   ? Hypertension Mother   ? Anxiety disorder Mother   ? Heart disease Father   ?     rheumatic heart disease  ? Heart attack Father   ? Rheum arthritis Father   ? Colon cancer Brother   ? Hypertension Maternal Grandmother   ? Hypertension Maternal Grandfather   ? Breast cancer Maternal Aunt 60  ? Breast cancer Cousin 81  ? ?Social History  ? ?Socioeconomic History  ? Marital status: Widowed  ?  Spouse name: Not on file  ? Number of children: 2  ? Years of education: 12  ? Highest education level: Associate degree: academic program  ?Occupational History  ? Occupation: Retired  ?Tobacco Use  ?  Smoking status: Former  ?  Packs/day: 1.00  ?  Years: 15.00  ?  Pack years: 15.00  ?  Types: Cigarettes  ?  Quit date: 08/03/2003  ?  Years since quitting: 17.9  ? Smokeless tobacco: Never  ?Vaping Use  ? Vaping Use: Never used  ?Substance and Sexual Activity  ? Alcohol use: No  ? Drug use: No  ? Sexual activity: Not on file  ?Other Topics Concern  ? Not on file  ?Social History Narrative  ? Not on file  ? ?Social Determinants of Health  ? ?Financial Resource Strain: Not on file  ?Food Insecurity: Not on file  ?Transportation Needs: Not on file  ?Physical Activity: Not on file  ?Stress: Not on file  ?Social Connections: Not on file  ? ? ?Tobacco Counseling ?Counseling given: Not Answered ? ? ?Clinical Intake: ? ?Pre-visit preparation completed: Yes ? ?Pain : No/denies pain ? ?  ? ?Nutritional Risks: None ?Diabetes: Yes ?CBG done?: No ?Did pt. bring in CBG monitor from home?: No ? ?How often do you need to have someone help you when you read instructions, pamphlets, or other written materials from your doctor or  pharmacy?: 1 - Never ? ?Diabetic?yes ?Nutrition Risk Assessment: ? ?Has the patient had any N/V/D within the last 2 months?  No  ?Does the patient have any non-healing wounds?  No  ?Has the patient had

## 2021-07-19 ENCOUNTER — Other Ambulatory Visit: Payer: Self-pay | Admitting: Family Medicine

## 2021-07-19 DIAGNOSIS — E1169 Type 2 diabetes mellitus with other specified complication: Secondary | ICD-10-CM

## 2021-07-20 ENCOUNTER — Other Ambulatory Visit: Payer: Self-pay

## 2021-07-20 ENCOUNTER — Encounter: Payer: Self-pay | Admitting: Family Medicine

## 2021-07-20 ENCOUNTER — Ambulatory Visit (INDEPENDENT_AMBULATORY_CARE_PROVIDER_SITE_OTHER): Payer: Medicare HMO | Admitting: Family Medicine

## 2021-07-20 VITALS — BP 138/58 | HR 67 | Temp 98.4°F | Resp 16 | Wt 141.8 lb

## 2021-07-20 DIAGNOSIS — E785 Hyperlipidemia, unspecified: Secondary | ICD-10-CM

## 2021-07-20 DIAGNOSIS — E1159 Type 2 diabetes mellitus with other circulatory complications: Secondary | ICD-10-CM | POA: Diagnosis not present

## 2021-07-20 DIAGNOSIS — R319 Hematuria, unspecified: Secondary | ICD-10-CM

## 2021-07-20 DIAGNOSIS — I152 Hypertension secondary to endocrine disorders: Secondary | ICD-10-CM

## 2021-07-20 DIAGNOSIS — E119 Type 2 diabetes mellitus without complications: Secondary | ICD-10-CM | POA: Diagnosis not present

## 2021-07-20 DIAGNOSIS — E1169 Type 2 diabetes mellitus with other specified complication: Secondary | ICD-10-CM | POA: Diagnosis not present

## 2021-07-20 DIAGNOSIS — Z0000 Encounter for general adult medical examination without abnormal findings: Secondary | ICD-10-CM

## 2021-07-20 NOTE — Progress Notes (Signed)
? ? ?I,Jessica Wheeler,acting as a scribe for Jessica Paganini, MD.,have documented all relevant documentation on the behalf of Jessica Paganini, MD,as directed by  Jessica Paganini, MD while in the presence of Jessica Paganini, MD. ? ? ?Complete physical exam ? ? ?Patient: Jessica Wheeler   DOB: 1940/06/15   81 y.o. Female  MRN: [id number] ?Visit Date: 07/20/2021 ? ?Today's healthcare provider: Lavon Paganini, MD  ? ?Chief Complaint  ?Patient presents with  ? Annual Exam  ? ?Subjective  ?  ?Jessica Wheeler is a 81 y.o. female who presents today for a complete physical exam.  ?She reports consuming a general diet. Home exercise routine includes walking 3 hrs per week. She generally feels well. She reports sleeping fairly well. She does have additional problems to discuss today.  ?HPI  ?Patient wanting to go over her BP readings. She reports that she checks BP first thing in the morning without medication and readings are usually normal.  ?AWV 07/14/21 ? ?Past Medical History:  ?Diagnosis Date  ? Allergy   ? Arthritis   ? Bundle branch block, right   ? C7 cervical fracture ([place])   ? Diabetes mellitus   ? Diffuse cystic mastopathy   ? Dissection, aorta (HCC)   ? Dr.Dew follows  ? Diverticulosis   ? Family history of malignant neoplasm of gastrointestinal tract   ? Glaucoma 2005  ? Heart murmur   ? History of bronchitis   ? History of pancreatitis 2005  ? Hyperlipidemia   ? Hypertension 1982  ? Personal history of tobacco use, presenting hazards to health   ? RBBB   ? Ulcer   ? ?Past Surgical History:  ?Procedure Laterality Date  ? ABDOMINAL HYSTERECTOMY  1991  ? ANTERIOR CERVICAL CORPECTOMY N/A 03/30/2020  ? Procedure: CORPECTOMY CERVICAL FOUR - CERVICAL FIVE;  Surgeon: Jessica Pies, MD;  Location: [facility];  Service: Neurosurgery;  Laterality: N/A;  anterior  ? APPENDECTOMY    ? [place]  ? for lumbar ruptured disc. No hardware, no discectomy, no fusion  ? BLADDER SURGERY    ? BREAST BIOPSY Right  1980's  ? benign  ? BREAST EXCISIONAL BIOPSY Right 1980s  ? benign  ? BREAST SURGERY    ? biopsy  ? CARDIAC CATHETERIZATION    ? Dr. Ubaldo Wheeler, few years ago  ? CATARACT EXTRACTION W/PHACO Left 08/31/2015  ? Procedure: CATARACT EXTRACTION PHACO AND INTRAOCULAR LENS PLACEMENT (IOC);  Surgeon: Jessica Freshwater, MD;  Location: [facility];  Service: Ophthalmology;  Laterality: Left;  DIABETIC - oral meds  ? CATARACT EXTRACTION W/PHACO Right 10/12/2015  ? Procedure: CATARACT EXTRACTION PHACO AND INTRAOCULAR LENS PLACEMENT ([place]) right eye;  Surgeon: Jessica Freshwater, MD;  Location: [facility];  Service: Ophthalmology;  Laterality: Right;  DIABETIC - oral meds  ? CHOLECYSTECTOMY  1994  ? COLONOSCOPY  2008,02/22/2012  ? Dr. Sankar-2013  ? COLONOSCOPY WITH PROPOFOL N/A 04/04/2017  ? Procedure: COLONOSCOPY WITH PROPOFOL;  Surgeon: Jessica Lye, MD;  Location[facility] END[facility]COPY;  Service: Endoscopy;  Laterality: N/A;  ? POSTERIOR CERVICAL FUSION/FORAMINOTOMY N/A 04/01/2020  ? Procedure: POSTERIOR CERVICAL FUSION/FORAMINOTOMY CERVICAL FOUR- CERVICAL SIX;  Surgeon: Jessica Pies, MD;  Location: [facility];  Service: Neurosurgery;  Laterality: N/A;  ? REFRACTIVE SURGERY    ? ?Social History  ? ?Socioeconomic History  ? Marital status: Widowed  ?  Spouse name: Not on file  ? Number of children: 2  ? Years of education: 72  ?  Highest education level: Associate degree: academic program  ?Occupational History  ? Occupation: Retired  ?Tobacco Use  ? Smoking status: Former  ?  Packs/day: 1.00  ?  Years: 15.00  ?  Pack years: 15.00  ?  Types: Cigarettes  ?  Quit date: 08/03/2003  ?  Years since quitting: 17.9  ? Smokeless tobacco: Never  ?Vaping Use  ? Vaping Use: Never used  ?Substance and Sexual Activity  ? Alcohol use: No  ? Drug use: No  ? Sexual activity: Not on file  ?Other Topics Concern  ? Not on file  ?Social History Narrative  ? Not on file  ? ?Social Determinants of Health  ? ?Financial Resource  Strain: Low Risk   ? Difficulty of Paying Living Expenses: Not hard at all  ?Food Insecurity: No Food Insecurity  ? Worried About Charity fundraiser in the Last Year: Never true  ? Ran Out of Food in the Last Year: Never true  ?Transportation Needs: No Transportation Needs  ? Lack of Transportation (Medical): No  ? Lack of Transportation (Non-Medical): No  ?Physical Activity: Sufficiently Active  ? Days of Exercise per Week: 4 days  ? Minutes of Exercise per Session: 40 min  ?Stress: No Stress Concern Present  ? Feeling of Stress : Only a little  ?Social Connections: Moderately Isolated  ? Frequency of Communication with Friends and Family: More than three times a week  ? Frequency of Social Gatherings with Friends and Family: Once a week  ? Attends Religious Services: More than 4 times per year  ? Active Member of Clubs or Organizations: No  ? Attends Archivist Meetings: Never  ? Marital Status: Widowed  ?Intimate Partner Violence: Not At Risk  ? Fear of Current or Ex-Partner: No  ? Emotionally Abused: No  ? Physically Abused: No  ? Sexually Abused: No  ? ?Family Status  ?Relation Name Status  ? Mother  Deceased  ? Father  Deceased at age 72  ? Brother  Deceased at age 78  ? MGM  Deceased  ? MGF  Deceased  ? Mat Aunt  Deceased  ? Cousin  (Not Specified)  ? Daughter  Alive  ? Son  Alive  ? ?Family History  ?Problem Relation Age of Onset  ? Diabetes Mother   ? Hypertension Mother   ? Anxiety disorder Mother   ? Heart disease Father   ?     rheumatic heart disease  ? Heart attack Father   ? Rheum arthritis Father   ? Colon cancer Brother   ? Hypertension Maternal Grandmother   ? Hypertension Maternal Grandfather   ? Breast cancer Maternal Aunt 60  ? Breast cancer Cousin 4  ? ?Allergies  ?Allergen Reactions  ? Beta Adrenergic Blockers   ?  Chest pressure/difficulty breathing  ? Jardiance [Empagliflozin]   ?  Muscle cramps  ? Levaquin [Levofloxacin In D5w]   ?  Low blood sugar; does take this when she needs  it. She just makes sure to control her BS to avoid hypoglycemia.  ? Lisinopril Cough  ? Zetia [Ezetimibe]   ?  Lethargic   ? Prevnar [Pneumococcal 13-Val Conj Vacc] Rash  ?  Joint Pain  ? Sulfa Antibiotics Rash  ?  "makes my skin raw"  ?  ?Patient Care Team: ?Virginia Crews, MD as PCP - General (Family Medicine) ?Minna Merritts, MD as Consulting Physician (Cardiology) ?Felipa Furnace, DPM as Consulting Physician (Podiatry) ?Jomarie Longs  B, OD (Optometry) ?Neldon Labella, RN as Case Manager ?Susa Day, MD as Consulting Physician (Orthopedic Surgery) ?Jessica Pies, MD as Consulting Physician (Neurosurgery) ?Clyde Canterbury, MD as Referring Physician (Otolaryngology)  ? ?Medications: ?Outpatient Medications Prior to Visit  ?Medication Sig  ? Accu-Chek FastClix Lancets MISC TEST FASTING BLOOD SUGAR EVERY DAY  ? amLODipine (NORVASC) 5 MG tablet TAKE 1 TABLET EVERY DAY  ? aspirin 81 MG EC tablet Take 81 mg by mouth daily.  ? Blood Glucose Monitoring Suppl (ACCU-CHEK GUIDE) w/Device KIT USE AS DIRECTED TO CHECK FASTING BLOOD SUGAR DAILY  ? cyclobenzaprine (FLEXERIL) 5 MG tablet Take 5 mg by mouth 3 (three) times daily as needed.  ? fluconazole (DIFLUCAN) 150 MG tablet Take by mouth.  ? glucose blood (ACCU-CHEK GUIDE) test strip TEST FASTING BLOOD SUGAR EVERY DAY AS DIRECTED  ? hydrochlorothiazide (HYDRODIURIL) 25 MG tablet Take 25 mg by mouth daily.  ? ibuprofen (ADVIL) 200 MG tablet Take 200 mg by mouth daily at 6 (six) AM.  ? Lancets Misc. (ACCU-CHEK FASTCLIX LANCET) KIT 1 each by Does not apply route as directed.  ? latanoprost (XALATAN) 0.005 % ophthalmic solution Place 1 drop into both eyes at bedtime.   ? losartan (COZAAR) 100 MG tablet TAKE 1 TABLET EVERY DAY  ? Magnesium 250 MG TABS Take 250 mg by mouth daily.  ? metFORMIN (GLUCOPHAGE) 500 MG tablet TAKE 2 TABLETS TWICE DAILY WITH MEALS  ? neomycin-polymyxin-hydrocortisone (CORTISPORIN) OTIC solution SMARTSIG:3-4 Drop(s) In Ear(s) 3 Times Daily PRN   ? vitamin B-12 (CYANOCOBALAMIN) 1000 MCG tablet Take 1,000 mcg by mouth daily.  ? [DISCONTINUED] benzonatate (TESSALON PERLES) 100 MG capsule Take 1-2 capsules (100-200 mg total) by mouth 2 (two) times dai

## 2021-07-20 NOTE — Patient Instructions (Addendum)
The CDC recommends two doses of Shingrix (the shingles vaccine) separated by 2 to 6 months for adults age 83 years and older. I recommend checking with your insurance plan regarding coverage for this vaccine.   ? ?Repeat urine for blood in mid-April around 4/20 ?

## 2021-07-20 NOTE — Assessment & Plan Note (Signed)
Well controlled on home readings Continue current meds Recheck metabolic panel 

## 2021-07-20 NOTE — Assessment & Plan Note (Signed)
Previously well controlled ?rechekc a1c ?Continue current meds ?Foot exam and uacr today ?Continue ARB ?Not on statin ?

## 2021-07-20 NOTE — Assessment & Plan Note (Signed)
Previously well controlled ?Did not tolerate zetia due to myalgias and hesitant to try statins ?Repeat FLP and CMP annually ?

## 2021-07-21 LAB — BASIC METABOLIC PANEL
BUN/Creatinine Ratio: 19 (ref 12–28)
BUN: 13 mg/dL (ref 8–27)
CO2: 21 mmol/L (ref 20–29)
Calcium: 10 mg/dL (ref 8.7–10.3)
Chloride: 92 mmol/L — ABNORMAL LOW (ref 96–106)
Creatinine, Ser: 0.67 mg/dL (ref 0.57–1.00)
Glucose: 131 mg/dL — ABNORMAL HIGH (ref 70–99)
Potassium: 4.1 mmol/L (ref 3.5–5.2)
Sodium: 130 mmol/L — ABNORMAL LOW (ref 134–144)
eGFR: 87 mL/min/{1.73_m2} (ref >59)

## 2021-07-21 LAB — HEMOGLOBIN A1C
Est. average glucose Bld gHb Est-mCnc: 148 mg/dL
Hgb A1c MFr Bld: 6.8 % — ABNORMAL HIGH (ref 4.8–5.6)

## 2021-07-21 LAB — MICROALBUMIN / CREATININE URINE RATIO
Creatinine, Urine: 56.2 mg/dL
Microalb/Creat Ratio: 17 mg/g creat (ref 0–29)
Microalbumin, Urine: 9.4 ug/mL

## 2021-07-22 ENCOUNTER — Ambulatory Visit (INDEPENDENT_AMBULATORY_CARE_PROVIDER_SITE_OTHER): Payer: Medicare HMO

## 2021-07-22 DIAGNOSIS — E1169 Type 2 diabetes mellitus with other specified complication: Secondary | ICD-10-CM

## 2021-07-22 DIAGNOSIS — E1159 Type 2 diabetes mellitus with other circulatory complications: Secondary | ICD-10-CM

## 2021-07-22 DIAGNOSIS — R319 Hematuria, unspecified: Secondary | ICD-10-CM

## 2021-07-22 NOTE — Patient Instructions (Addendum)
Thank you for allowing the Chronic Care Management team to participate in your care. It was great speaking with you today! °

## 2021-07-30 DIAGNOSIS — E1169 Type 2 diabetes mellitus with other specified complication: Secondary | ICD-10-CM

## 2021-07-30 DIAGNOSIS — I10 Essential (primary) hypertension: Secondary | ICD-10-CM

## 2021-07-30 DIAGNOSIS — Z7984 Long term (current) use of oral hypoglycemic drugs: Secondary | ICD-10-CM

## 2021-08-16 NOTE — Chronic Care Management (AMB) (Signed)
?Chronic Care Management  ? ?CCM RN Visit Note ? ? ? ?Name: Jessica Wheeler MRN: [id number] DOB: 1941/02/05 ? ?Subjective: ?Jessica Wheeler is a 81 y.o. year old female who is a primary care patient of Bacigalupo, [name], MD. The care management team was consulted for assistance with disease management and care coordination needs.   ? ?Engaged with patient by telephone for follow up visit in response to provider referral for case management and/or care coordination services.  ? ?Consent to Services:  ?The patient was given information about Chronic Care Management services, agreed to services, and gave verbal consent prior to initiation of services.  Please see initial visit note for detailed documentation.  ? ?Patient agreed to services and verbal consent obtained.  ? ?Assessment: Review of patient past medical history, allergies, medications, health status, including review of consultants reports, laboratory and other test data, was performed as part of comprehensive evaluation and provision of chronic care management services.  ? ?SDOH (Social Determinants of Health) assessments and interventions performed:   ? ?CCM Care Plan ? ?Allergies  ?Allergen Reactions  ? Beta Adrenergic Blockers   ?  Chest pressure/difficulty breathing  ? Jardiance [Empagliflozin]   ?  Muscle cramps  ? Levaquin [Levofloxacin In D5w]   ?  Low blood sugar; does take this when she needs it. She just makes sure to control her BS to avoid hypoglycemia.  ? Lisinopril Cough  ? Zetia [Ezetimibe]   ?  Lethargic   ? Prevnar [Pneumococcal 13-Val Conj Vacc] Rash  ?  Joint Pain  ? Sulfa Antibiotics Rash  ?  "makes my skin raw"  ? ? ?Outpatient Encounter Medications as of 07/22/2021  ?Medication Sig  ? Accu-Chek FastClix Lancets MISC TEST FASTING BLOOD SUGAR EVERY DAY  ? amLODipine (NORVASC) 5 MG tablet TAKE 1 TABLET EVERY DAY  ? aspirin 81 MG EC tablet Take 81 mg by mouth daily.  ? Blood Glucose Monitoring Suppl (ACCU-CHEK GUIDE) w/Device KIT USE AS  DIRECTED TO CHECK FASTING BLOOD SUGAR DAILY  ? cyclobenzaprine (FLEXERIL) 5 MG tablet Take 5 mg by mouth 3 (three) times daily as needed.  ? fluconazole (DIFLUCAN) 150 MG tablet Take by mouth.  ? glucose blood (ACCU-CHEK GUIDE) test strip TEST FASTING BLOOD SUGAR EVERY DAY AS DIRECTED  ? hydrochlorothiazide (HYDRODIURIL) 25 MG tablet Take 25 mg by mouth daily.  ? ibuprofen (ADVIL) 200 MG tablet Take 200 mg by mouth daily at 6 (six) AM.  ? Lancets Misc. (ACCU-CHEK FASTCLIX LANCET) KIT 1 each by Does not apply route as directed.  ? latanoprost (XALATAN) 0.005 % ophthalmic solution Place 1 drop into both eyes at bedtime.   ? losartan (COZAAR) 100 MG tablet TAKE 1 TABLET EVERY DAY  ? Magnesium 250 MG TABS Take 250 mg by mouth daily.  ? metFORMIN (GLUCOPHAGE) 500 MG tablet TAKE 2 TABLETS TWICE DAILY WITH MEALS  ? neomycin-polymyxin-hydrocortisone (CORTISPORIN) OTIC solution SMARTSIG:3-4 Drop(s) In Ear(s) 3 Times Daily PRN  ? vitamin B-12 (CYANOCOBALAMIN) 1000 MCG tablet Take 1,000 mcg by mouth daily.  ? ?No facility-administered encounter medications on file as of 07/22/2021.  ? ? ?Patient Active Problem List  ? Diagnosis Date Noted  ? Carpal tunnel syndrome 07/13/2021  ? Numbness of hand 07/13/2021  ? 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 Spinal stenosis of lumbar region 07/13/2021  ? Cervical spondylosis with myelopathy and radiculopathy 04/01/2020  ? Lumbar adjacent segment disease with spondylolisthesis 03/30/2020  ? Overactive bladder 01/17/2020  ?  Myalgia due to statin 12/13/2019  ? Spinal stenosis in cervical region 11/19/2019  ? Cervical spine pain 10/15/2019  ? Low back pain 03/26/2019  ? History of lumbar laminectomy 03/26/2019  ? Scoliosis deformity of spine 03/26/2019  ? Spondylolisthesis, grade 1 03/26/2019  ? Lumbar radiculopathy 03/26/2019  ? Hyperlipidemia associated with type 2 diabetes mellitus ([place]) 03/15/2019  ? Fatigue 12/11/2017  ? Primary osteoarthritis of left  knee 03/10/2016  ? Depression 12/28/2015  ? Knee pain 11/26/2015  ? Allergic rhinitis 01/31/2015  ? Benign paroxysmal positional nystagmus 01/31/2015  ? Bladder infection, chronic 01/31/2015  ? Glaucoma 01/31/2015  ? Chronic hyponatremia 01/31/2015  ? 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 Anxiety 11/04/2014  ? Fibrocystic breast disease 04/30/2013  ? Urinary tract infection 11/01/2012  ? Renal artery stenosis ([place]) 04/18/2012  ? Chest pain 10/10/2011  ? Murmur 10/10/2011  ? 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 Hypertension associated with diabetes ([place]) 08/03/2010  ? CAD (coronary artery disease) 08/03/2010  ? DM (diabetes mellitus) ([place]) 08/03/2010  ? ? ?Care Plan : Diabetes Type 2 (Adult)  ?  ?  ? ?Problem: Disease Progression (Diabetes, Type 2)   ?  ? ?Long-Range Goal: Disease Progression Prevented or Minimized   ?Priority: High  ?Note:   ?Current Barriers:  ?Chronic disease management and support related to Diabetes self-management ? ?Case Manager Clinical Goal(s):  ?Over the next 120 days, patient will demonstrate continued adherence to prescribed treatment plan for Diabetes self management as evidenced by daily monitoring and recording of CBG, adherence to ADA/ carb modified diet and adherence to prescribed medication regimen. ? ?Interventions:  ?Collaboration with Bacigalupo, [name], MD regarding development and update of comprehensive plan of care as evidenced by provider attestation and co-signature ?Inter-disciplinary care team collaboration (see longitudinal plan of care) ?Reviewed blood glucose readings and compliance with treatment plan. She was evaluated by [facility] d/t severe lower back pain. Prednisone was prescribed. Recalls fasting blood glucose levels were elevated with readings in the 200's immediately after starting prednisone. Reports readings in the past few days have ranged in the low to mid 140's. She is very knowledgeable of s/sx of hypoglycemia and  hyperglycemia along with appropriate interventions. A1C is currently not at goal. Reports monitoring her levels consistently and complying a with a diabetic diet. Remains compliant with DM preventive care exams. ?Update: Pending confirmation from the Neurology team regarding plan. Reports requiring prednisone to assist with symptoms r/t to joint pain and suspected nerve damage. Her blood glucose readings have been elevated since taking prednisone. Pending feedback regarding plan  for MRI. Imaging pending approval from her insurance provider. She is aware of possible need for medication adjustments to maintain optimal glycemic control if her blood glucose readings remain elevated. ? ? ? ?Patient Goals/Self-Care Activities ?-Continue administering medications as prescribed ?-Attend all scheduled provider appointments ?-Monitor blood glucose levels consistently and utilize recommended interventions ?-Continue adherence to prescribed ADA/carb modified ?-Notify provider or care management team with questions and new concerns as needed. ? ? ?Follow Up Plan:  ?Will follow up next month ? ? ? ? ?  ? ?Care Plan : Hypertension (Adult)  ?  ?  ? ?Problem: Hypertension (Hypertension)   ?  ? ?Long-Range Goal: Hypertension Monitored   ?Priority: High  ?Note:   ?Objective:  ?BP Readings from Last 3 Encounters:  ?09/10/20 (!) 152/65  ?07/21/20 (!) 146/72  ?04/03/20 (!) 164/69  ?Current Barriers:  ?Chronic Disease Management needs and support related  Hypertension management. ? ?Case Manager Clinical Goal(s):  ?Over the next 120 days, patient will demonstrate adherence to prescribed treatment plan for hypertension as evidenced by taking all medications as prescribed, monitoring and recording blood pressure, and adhering to a cardiac prudent/heart healthy diet. ? ?Interventions:  ?Collaboration with Bacigalupo, [name], MD regarding development and update of comprehensive plan of care as evidenced by provider attestation and  co-signature ?Inter-disciplinary care team collaboration (see longitudinal plan of care) ?Reviewed medications and home BP readings. Clinic readings have been elevated but reports home readings have been within range

## 2021-08-18 ENCOUNTER — Telehealth: Payer: Self-pay

## 2021-08-18 NOTE — Telephone Encounter (Signed)
Lvm returning call about diabetic shoes. Asked pt to call back.

## 2021-08-26 DIAGNOSIS — Z6825 Body mass index (BMI) 25.0-25.9, adult: Secondary | ICD-10-CM | POA: Diagnosis not present

## 2021-08-26 DIAGNOSIS — G5603 Carpal tunnel syndrome, bilateral upper limbs: Secondary | ICD-10-CM | POA: Diagnosis not present

## 2021-09-03 NOTE — Progress Notes (Signed)
Cardiology Office Note ? ?Date:  09/06/2021  ? ?ID:  Jessica Wheeler, DOB 22-Aug-1940, MRN [id number] ? ?PCP:  Jessica Crews, MD  ? ?Chief Complaint  ?Patient presents with  ? 6 month follow up   ?  "Doing well." Medications reviewed by the patient verbally.   ? ? ? ?HPI:  ?Jessica Wheeler is a 81 year-old woman with a history of  ?diabetes, hemoglobin A1c 6 range ,rarely 7 ?hypertension,  ?Former smoker  ?aortic dissection following catheterization,   ?short region of dissection in the distal aorta, seen on CT scan in 2010 ?renal artery stenosis followed by Dr. Lucky Cowboy.  ?Chronically low sodium.  ?Cardiac catheterization in 2007  showed 20% proximal RCA disease and 20% LAD disease.short region of dissection in the distal aorta, seen on CT scan in 2010 ?aortoiliac atherosclerosis, followed by Dr. Lucky Cowboy ?who presents for routine followup of her coronary artery disease.  ? ?Last seen by myself in clinic November 2022 ? ?In follow-up today reports that she feels tired, did not sleep well last night ?Thinks magnesium low, more grouchy ?Typically feels better after she takes a magnesium supplement ? ?Busy in garden ?Walks 2 miles a day, walks slower than she used to ?Denies chest pain or shortness of breath concerning for angina ? ?Denies significant leg swelling ?Previously reported leg cramping on a diuretic ?Remains on HCTZ daily ? ?Labs reviewed ?A1C 6.8 ?Total cholesterol 187 LDL 90 on no medication ? ?EKG personally reviewed by myself on todays visit ?Nsr rate 81 bpm right bundle branch block ? ?Other past medical history reviewed ?Off jardiance, had side effects ?Off zetia: "makes me ache" ?Not on a statin, myalgias ?Previously with Intolerance to imdur, ziac ? feels terrible on beta blockers ? ?MRI:  ?1. Severe canal stenosis at L3-L4 and moderate canal stenosis at ?L2-L3. Grade 2 anterolisthesis of L4 on L5 with moderate bilateral ?subarticular recess narrowing and mild-to-moderate central canal ?stenosis. ?2. Moderate  foraminal stenosis on the left at L2-L3 and mild to ?moderate foraminal stenosis bilaterally at L3-L4 and L4-L5. ? ?CT neck  ?Severe spinal stenosis because of chronic calcified disc herniations ?at C3-4, C4-5 and C5-6. ?Facet arthropathy with foraminal stenosis that could cause neural ?compression at C3-4 left more than right, C4-5 left more than right, ?C5-6 right worse than left and C6-7 bilateral. ? ?lower extremity arterial studies from Dr. Lucky Cowboy  ?Noninvasive studies show mildly elevated velocities in the common iliac arteries bilaterally although this does not appear to be significantly high-grade.  Her aorta is not aneurysmal. ?  ?Previous  Echocardiogram was performed that was normal  ?Stress test/Myoview was also normal showing no ischemia .  ? ? ?PMH:   has a past medical history of Allergy, Arthritis, Bundle branch block, right, C7 cervical fracture ([place]), Diabetes mellitus, Diffuse cystic mastopathy, Dissection, aorta ([place]), Diverticulosis, Family history of malignant neoplasm of gastrointestinal tract, Glaucoma (2005), Heart murmur, History of bronchitis, History of pancreatitis (2005), Hyperlipidemia, Hypertension (1982), Personal history of tobacco use, presenting hazards to health, RBBB, and Ulcer. ? ?PSH:    ?Past Surgical History:  ?Procedure Laterality Date  ? ABDOMINAL HYSTERECTOMY  1991  ? ANTERIOR CERVICAL CORPECTOMY N/A 03/30/2020  ? Procedure: CORPECTOMY CERVICAL FOUR - CERVICAL FIVE;  Surgeon: Newman Pies, MD;  Location: [place];  Service: Neurosurgery;  Laterality: N/A;  anterior  ? APPENDECTOMY    ? [place]  ? for lumbar ruptured disc. No hardware, no discectomy, no fusion  ? BLADDER SURGERY    ?  BREAST BIOPSY Right 1980's  ? benign  ? BREAST EXCISIONAL BIOPSY Right 1980s  ? benign  ? BREAST SURGERY    ? biopsy  ? CARDIAC CATHETERIZATION    ? Dr. Ubaldo Glassing, few years ago  ? CATARACT EXTRACTION W/PHACO Left 08/31/2015  ? Procedure: CATARACT EXTRACTION PHACO AND INTRAOCULAR LENS  PLACEMENT (IOC);  Surgeon: Ronnell Freshwater, MD;  Location: [facility];  Service: Ophthalmology;  Laterality: Left;  DIABETIC - oral meds  ? CATARACT EXTRACTION W/PHACO Right 10/12/2015  ? Procedure: CATARACT EXTRACTION PHACO AND INTRAOCULAR LENS PLACEMENT ([place]) right eye;  Surgeon: Ronnell Freshwater, MD;  Location: [facility];  Service: Ophthalmology;  Laterality: Right;  DIABETIC - oral meds  ? CHOLECYSTECTOMY  1994  ? COLONOSCOPY  2008,02/22/2012  ? Dr. Sankar-2013  ? COLONOSCOPY WITH PROPOFOL N/A 04/04/2017  ? Procedure: COLONOSCOPY WITH PROPOFOL;  Surgeon: Christene Lye, MD;  Location[facility]COPY;  Service: Endoscopy;  Laterality: N/A;  ? POSTERIOR CERVICAL FUSION/FORAMINOTOMY N/A 04/01/2020  ? Procedure: POSTERIOR CERVICAL FUSION/FORAMINOTOMY CERVICAL FOUR- CERVICAL SIX;  Surgeon: Newman Pies, MD;  Location: [place];  Service: Neurosurgery;  Laterality: N/A;  ? REFRACTIVE SURGERY    ? ? ?Current Outpatient Medications  ?Medication Sig Dispense Refill  ? Accu-Chek FastClix Lancets MISC TEST FASTING BLOOD SUGAR EVERY DAY 102 each 3  ? amLODipine (NORVASC) 5 MG tablet TAKE 1 TABLET EVERY DAY 90 tablet 1  ? aspirin 81 MG EC tablet Take 81 mg by mouth daily.    ? Blood Glucose Monitoring Suppl (ACCU-CHEK GUIDE) w/Device KIT USE AS DIRECTED TO CHECK FASTING BLOOD SUGAR DAILY 1 kit 0  ? glucose blood (ACCU-CHEK GUIDE) test strip TEST FASTING BLOOD SUGAR EVERY DAY AS DIRECTED 100 strip 2  ? hydrochlorothiazide (HYDRODIURIL) 25 MG tablet Take 25 mg by mouth daily.    ? ibuprofen (ADVIL) 200 MG tablet Take 200 mg by mouth daily at 6 (six) AM.    ? Lancets Misc. (ACCU-CHEK FASTCLIX LANCET) KIT 1 each by Does not apply route as directed. 1 kit 1  ? latanoprost (XALATAN) 0.005 % ophthalmic solution Place 1 drop into both eyes at bedtime.   3  ? losartan (COZAAR) 100 MG tablet TAKE 1 TABLET EVERY DAY 90 tablet 0  ? Magnesium 250 MG TABS Take 250 mg by mouth daily.    ? metFORMIN  (GLUCOPHAGE) 500 MG tablet TAKE 2 TABLETS TWICE DAILY WITH MEALS 360 tablet 1  ? neomycin-polymyxin-hydrocortisone (CORTISPORIN) OTIC solution SMARTSIG:3-4 Drop(s) In Ear(s) 3 Times Daily PRN    ? vitamin B-12 (CYANOCOBALAMIN) 1000 MCG tablet Take 1,000 mcg by mouth daily.    ? cyclobenzaprine (FLEXERIL) 5 MG tablet Take 5 mg by mouth 3 (three) times daily as needed. (Patient not taking: Reported on 09/06/2021)    ? fluconazole (DIFLUCAN) 150 MG tablet Take by mouth. (Patient not taking: Reported on 09/06/2021)    ? ?No current facility-administered medications for this visit.  ? ? ?Allergies:   Beta adrenergic blockers, Jardiance [empagliflozin], Levaquin [levofloxacin in d5w], Lisinopril, Zetia [ezetimibe], Prevnar [pneumococcal 13-val conj vacc], and Sulfa antibiotics  ? ?Social History:  The patient  reports that she quit smoking about 18 years ago. Her smoking use included cigarettes. She has a 15.00 pack-year smoking history. She has never used smokeless tobacco. She reports that she does not drink alcohol and does not use drugs.  ? ?Family History:   family history includes Anxiety disorder in her mother; Breast cancer (age of onset: 38) in  her cousin; Breast cancer (age of onset: 28) in her maternal aunt; Colon cancer in her brother; Diabetes in her mother; Heart attack in her father; Heart disease in her father; Hypertension in her maternal grandfather, maternal grandmother, and mother; Rheum arthritis in her father.  ? ? ?Review of Systems: ?Review of Systems  ?Constitutional: Negative.   ?HENT: Negative.    ?Respiratory: Negative.    ?Cardiovascular: Negative.   ?Gastrointestinal: Negative.   ?Musculoskeletal:  Positive for joint pain.  ?Neurological: Negative.   ?Psychiatric/Behavioral: Negative.    ?All other systems reviewed and are negative. ? ?PHYSICAL EXAM: ?VS:  BP (!) 138/58 (BP Location: Left Arm, Patient Position: Sitting, Cuff Size: Normal)   Pulse 81   Ht '5\' 3"'  (1.6 m)   Wt 141 lb 6 oz (64.1  kg)   SpO2 98%   BMI 25.04 kg/m?  , BMI Body mass index is 25.04 kg/m?Marland Kitchen ?Constitutional:  oriented to person, place, and time. No distress.  ?HENT:  ?Head: Grossly normal ?Eyes:  no discharge. No scleral icterus.

## 2021-09-06 ENCOUNTER — Encounter: Payer: Self-pay | Admitting: Cardiovascular Disease

## 2021-09-06 ENCOUNTER — Ambulatory Visit: Payer: Medicare HMO | Admitting: Cardiovascular Disease

## 2021-09-06 VITALS — BP 138/58 | HR 81 | Ht 63.0 in | Wt 141.4 lb

## 2021-09-06 DIAGNOSIS — E782 Mixed hyperlipidemia: Secondary | ICD-10-CM | POA: Diagnosis not present

## 2021-09-06 DIAGNOSIS — I10 Essential (primary) hypertension: Secondary | ICD-10-CM

## 2021-09-06 DIAGNOSIS — I152 Hypertension secondary to endocrine disorders: Secondary | ICD-10-CM | POA: Diagnosis not present

## 2021-09-06 DIAGNOSIS — I701 Atherosclerosis of renal artery: Secondary | ICD-10-CM | POA: Diagnosis not present

## 2021-09-06 DIAGNOSIS — M791 Myalgia, unspecified site: Secondary | ICD-10-CM

## 2021-09-06 DIAGNOSIS — T466X5D Adverse effect of antihyperlipidemic and antiarteriosclerotic drugs, subsequent encounter: Secondary | ICD-10-CM

## 2021-09-06 DIAGNOSIS — E1159 Type 2 diabetes mellitus with other circulatory complications: Secondary | ICD-10-CM | POA: Diagnosis not present

## 2021-09-06 DIAGNOSIS — I7102 Dissection of abdominal aorta: Secondary | ICD-10-CM | POA: Diagnosis not present

## 2021-09-06 DIAGNOSIS — I25118 Atherosclerotic heart disease of native coronary artery with other forms of angina pectoris: Secondary | ICD-10-CM | POA: Diagnosis not present

## 2021-09-06 DIAGNOSIS — E871 Hypo-osmolality and hyponatremia: Secondary | ICD-10-CM | POA: Diagnosis not present

## 2021-09-06 DIAGNOSIS — T466X5A Adverse effect of antihyperlipidemic and antiarteriosclerotic drugs, initial encounter: Secondary | ICD-10-CM

## 2021-09-06 NOTE — Patient Instructions (Signed)
Medication Instructions:  No changes  If you need a refill on your cardiac medications before your next appointment, please call your pharmacy.   Lab work: No new labs needed  Testing/Procedures: No new testing needed  Follow-Up: At [facility], you and your health needs are our priority.  As part of our continuing mission to provide you with exceptional heart care, we have created designated Provider Care Teams.  These Care Teams include your primary Cardiologist (physician) and Advanced Practice Providers (APPs -  Physician Assistants and Nurse Practitioners) who all work together to provide you with the care you need, when you need it.  You will need a follow up appointment in 12 months  Providers on your designated Care Team:   Christopher Berge, NP Ryan Dunn, PA-C Cadence Furth, PA-C  COVID-19 Vaccine Information can be found at: [email or web address] For questions related to vaccine distribution or appointments, please email [email or web address] or call [phone].   

## 2021-09-07 ENCOUNTER — Ambulatory Visit: Payer: Medicare HMO | Attending: Student | Admitting: Occupational Therapy

## 2021-09-07 DIAGNOSIS — R278 Other lack of coordination: Secondary | ICD-10-CM | POA: Insufficient documentation

## 2021-09-07 DIAGNOSIS — M6281 Muscle weakness (generalized): Secondary | ICD-10-CM | POA: Insufficient documentation

## 2021-09-07 NOTE — Therapy (Signed)
[place] ?[facility] ?[address] ?[place], [place], [address] ?Phone: [phone]   Fax:  [phone] ? ?Occupational Therapy Evaluation ? ?Patient Details  ?Name: Jessica Wheeler ?MRN: [id number] ?Date of Birth: 24-Apr-1941 ?Referring Provider (OT): Val Eagle, NP Surgeon Dr. Lovell Sheehan ? ? ?Encounter Date: 09/07/2021 ? ? OT End of Session - 09/07/21 1137   ? ? Visit Number 1   ? Number of Visits 24   ? Date for OT Re-Evaluation 11/30/21   ? Authorization Type Progress report period starting  09/07/2021   ? OT Start Time 0830   ? OT Stop Time 0930   ? OT Time Calculation (min) 60 min   ? Activity Tolerance Patient tolerated treatment well;Patient limited by pain   ? Behavior During Therapy [facility] for tasks assessed/performed   ? ?  ?  ? ?  ? ? ?Past Medical History:  ?Diagnosis Date  ? Allergy   ? Arthritis   ? Bundle branch block, right   ? C7 cervical fracture (HCC)   ? Diabetes mellitus   ? Diffuse cystic mastopathy   ? Dissection, aorta (HCC)   ? Dr.Dew follows  ? Diverticulosis   ? Family history of malignant neoplasm of gastrointestinal tract   ? Glaucoma 2005  ? Heart murmur   ? History of bronchitis   ? History of pancreatitis 2005  ? Hyperlipidemia   ? Hypertension 1982  ? Personal history of tobacco use, presenting hazards to health   ? RBBB   ? Ulcer   ? ? ?Past Surgical History:  ?Procedure Laterality Date  ? ABDOMINAL HYSTERECTOMY  1991  ? ANTERIOR CERVICAL CORPECTOMY N/A 03/30/2020  ? Procedure: CORPECTOMY CERVICAL FOUR - CERVICAL FIVE;  Surgeon: Tressie Stalker, MD;  Location: [facility] OR;  Service: Neurosurgery;  Laterality: N/A;  anterior  ? APPENDECTOMY    ? BACK SURGERY  1982  ? for lumbar ruptured disc. No hardware, no discectomy, no fusion  ? BLADDER SURGERY    ? BREAST BIOPSY Right 1980's  ? benign  ? BREAST EXCISIONAL BIOPSY Right 1980s  ? benign  ? BREAST SURGERY    ? biopsy  ? CARDIAC CATHETERIZATION    ? Dr. Lady Gary, few years ago  ? CATARACT EXTRACTION  W/PHACO Left 08/31/2015  ? Procedure: CATARACT EXTRACTION PHACO AND INTRAOCULAR LENS PLACEMENT (IOC);  Surgeon: Sherald Hess, MD;  Location: [facility];  Service: Ophthalmology;  Laterality: Left;  DIABETIC - oral meds  ? CATARACT EXTRACTION W/PHACO Right 10/12/2015  ? Procedure: CATARACT EXTRACTION PHACO AND INTRAOCULAR LENS PLACEMENT (IOC) right eye;  Surgeon: Sherald Hess, MD;  Location: [facility];  Service: Ophthalmology;  Laterality: Right;  DIABETIC - oral meds  ? CHOLECYSTECTOMY  1994  ? COLONOSCOPY  2008,02/22/2012  ? Dr. Sankar-2013  ? COLONOSCOPY WITH PROPOFOL N/A 04/04/2017  ? Procedure: COLONOSCOPY WITH PROPOFOL;  Surgeon: [name], MD;  Location: [facility] ENDOSCOPY;  Service: Endoscopy;  Laterality: N/A;  ? POSTERIOR CERVICAL FUSION/FORAMINOTOMY N/A 04/01/2020  ? Procedure: POSTERIOR CERVICAL FUSION/FORAMINOTOMY CERVICAL FOUR- CERVICAL SIX;  Surgeon: Tressie Stalker, MD;  Location: [facility] OR;  Service: Neurosurgery;  Laterality: N/A;  ? REFRACTIVE SURGERY    ? ? ?There were no vitals filed for this visit. ? ? Subjective Assessment - 09/07/21 1123   ? ? Subjective  Pt. reports that she walks 2 miles a day.   ? Pertinent History Pt. is an 81 y.o. female who was diagnosed with Bilateral carpal  tunnel syndrome. Pt. had a carpal tunnel release 05/2021. PMHx: Anterior cervical fusion, cervical spondylosis with myelopathy, and radiculopathy, RAS, Dissection of Abdominal Aorta, and HTN associated with DM.   ? Patient Stated Goals To be able to pin her quilting project   ? Currently in Pain? No/denies   Pt. reports having intermittent pain in her right thenar eminnence. Pt. reports at times having pain the radiates from her thumb, through her elbow, up  to her shoulder.  ? ?  ?  ? ?  ? ? ? ? ? [facility] OT Assessment - 09/07/21 0841   ? ?  ? Assessment  ? Medical Diagnosis Bilateral carpal Tunnel Syndrome   ? Referring Provider (OT) Val Eagle, NP Surgeon Dr. Lovell Sheehan    ? Onset Date/Surgical Date 05/02/21   ? Hand Dominance Right   ?  ? Precautions  ? Precautions None   ?  ? Restrictions  ? Weight Bearing Restrictions No   ?  ? Balance Screen  ? Has the patient fallen in the past 6 months Yes   ? How many times? 1   ? Has the patient had a decrease in activity level because of a fear of falling?  No   ? Is the patient reluctant to leave their home because of a fear of falling?  No   ?  ? Home  Environment  ? Family/patient expects to be discharged to: Private residence   ? Living Arrangements Alone   ? Type of Home House   ? Home Access Stairs   ? Bathroom Copywriter, advertising   ? Lives With Alone   ?  ? Prior Function  ? Level of Independence Independent   ? Vocation Retired   ? Leisure Quilting, walking   ?  ? ADL  ? Eating/Feeding Independent   ? Grooming Independent   ? Upper Body Bathing Independent   ? Lower Body Bathing Independent   ? Upper Body Dressing Independent   ? Lower Body Dressing Independent   Difficulty with buttoning, tying shoes-drops the laces  ? Toilet Transfer Independent   ? Toileting -  Hygiene Independent   ? Tub/Shower Transfer Independent   ?  ? IADL  ? Prior Level of Function Shopping Independent   ? Shopping Takes care of all shopping needs independently   ? Prior Level of Function Light Housekeeping Independent   ? Light Housekeeping Maintains house alone or with occasional assistance   difficulty with vacuuming, and mopping due to low back  ? Prior Level of Function Meal Prep Independent   ? Meal Prep Plans, prepares and serves adequate meals independently   cutting a cabbage is difficult  ? Prior Level of Function Medication Managment Independent   ? Medication Management Is responsible for taking medication in correct dosages at correct time   drops medication  ? Prior Level of Function Financial Management Independent   ? Financial Management Manages financial matters independently (budgets, writes checks, pays rent, bills goes to bank),  collects and keeps track of income   ?  ? Written Expression  ? Dominant Hand Right   ? Handwriting 75% legible   ?  ? Vision - History  ? Baseline Vision Wears glasses all the time   ? Visual History Glaucoma   ?  ? Cognition  ? Overall Cognitive Status Within Functional Limits for tasks assessed   ?  ? Observation/Other Assessments  ? Focus on Therapeutic Outcomes (FOTO)  53   ?  ?  Sensation  ? Light Touch --   Sensation diminished in right thumb, intermittent in the left hand  ?  ? Coordination  ? Right 9 Hole Peg Test 30 sec.   ? Left 9 Hole Peg Test 26 sec.   ?  ? AROM  ? Overall AROM Comments Wrist extension R: 55, L: 55, wrist flexion R: 74, L: 65, radial deviation: R: 15, L: 13, ulnar deviation: R: 37, L: 37   ?  ? Strength  ? Overall Strength Comments BUE strength WFL   ?  ? Hand Function  ? Right Hand Grip (lbs) 18#   ? Right Hand Lateral Pinch 7 lbs   ? Right Hand 3 Point Pinch 6 lbs   ? Left Hand Grip (lbs) 35#   ? Left Hand Lateral Pinch 13 lbs   ? Left 3 point pinch 10 lbs   ? Comment Right Thumb opposition to the tip of the 4th digit, L: to the tip of the 5th digit. Bilateral digit flexion to [facility]: 0cm.   ? ?  ?  ? ?  ? ? ? ? ? ? ? ? ? ? ? ? ? ? ? ? ? ? ? OT Education - 09/07/21 1137   ? ? Education Details OT services, POC, goals, and measurements   ? Person(s) Educated Patient   ? Methods Explanation   ? Comprehension Verbalized understanding;Returned demonstration;Verbal cues required   ? ?  ?  ? ?  ? ? ? OT Short Term Goals - 09/07/21 1151   ? ?  ? OT SHORT TERM GOAL #1  ? Title Pt. will be independent with HEPs   ? Baseline Eval: No current HEP.   ? Time 6   ? Period Weeks   ? Status New   ? Target Date 10/19/21   ?  ? OT SHORT TERM GOAL #2  ? Title Pt. will improve FOTO score by 2 pts for Pt. perceived performance improvement with assessment specifc ADL/IADLs   ? Baseline Eval:  FOTO score: 53, TR score: 61   ? Time 6   ? Period Weeks   ? Status New   ? Target Date 10/19/21   ? ?  ?  ? ?  ? ? ? ?  OT Long Term Goals - 09/07/21 1154   ? ?  ? OT LONG TERM GOAL #1  ? Title Pt. will increase Right grip strength by 5# to improve hold on tools.   ? Baseline Eval: Grip strength: R: 18#, L: 35#   ? Time 1

## 2021-09-13 ENCOUNTER — Ambulatory Visit: Payer: Medicare HMO | Admitting: Occupational Therapy

## 2021-09-13 DIAGNOSIS — R278 Other lack of coordination: Secondary | ICD-10-CM | POA: Diagnosis not present

## 2021-09-13 DIAGNOSIS — M6281 Muscle weakness (generalized): Secondary | ICD-10-CM

## 2021-09-13 NOTE — Therapy (Signed)
[place] ?[facility] ?[address] ?[place], [place], [address] ?Phone: [phone]   Fax:  [phone] ? ?Occupational Therapy Treatment ? ?Patient Details  ?Name: Jessica Wheeler ?MRN: [id number] ?Date of Birth: 04/09/1941 ?Referring Provider (OT): Val EagleMeghan Bergman, NP Surgeon Dr. Lovell SheehanJenkins ? ? ?Encounter Date: 09/13/2021 ? ? OT End of Session - 09/13/21 0939   ? ? Visit Number 2   ? Number of Visits 24   ? Date for OT Re-Evaluation 11/30/21   ? Authorization Type Progress report period starting  09/07/2021   ? OT Start Time 0930   ? OT Stop Time 1015   ? OT Time Calculation (min) 45 min   ? Activity Tolerance Patient tolerated treatment well;Patient limited by pain   ? Behavior [facility] for tasks assessed/performed   ? ?  ?  ? ?  ? ? ?Past Medical History:  ?Diagnosis Date  ? Allergy   ? Arthritis   ? Bundle branch block, right   ? C7 cervical fracture (HCC)   ? Diabetes mellitus   ? Diffuse cystic mastopathy   ? Dissection, aorta (HCC)   ? Dr.Dew follows  ? Diverticulosis   ? Family history of malignant neoplasm of gastrointestinal tract   ? Glaucoma 2005  ? Heart murmur   ? History of bronchitis   ? History of pancreatitis 2005  ? Hyperlipidemia   ? Hypertension 1982  ? Personal history of tobacco use, presenting hazards to health   ? RBBB   ? Ulcer   ? ? ?Past Surgical History:  ?Procedure Laterality Date  ? ABDOMINAL HYSTERECTOMY  1991  ? ANTERIOR CERVICAL CORPECTOMY N/A 03/30/2020  ? Procedure: CORPECTOMY CERVICAL FOUR - CERVICAL FIVE;  Surgeon: Tressie StalkerJenkins, Jeffrey, MD;  [facility];  Service: Neurosurgery;  Laterality: N/A;  anterior  ? APPENDECTOMY    ? BACK SURGERY  1982  ? for lumbar ruptured disc. No hardware, no discectomy, no fusion  ? BLADDER SURGERY    ? BREAST BIOPSY Right 1980's  ? benign  ? BREAST EXCISIONAL BIOPSY Right 1980s  ? benign  ? BREAST SURGERY    ? biopsy  ? CARDIAC CATHETERIZATION    ? Dr. Lady GaryFath, few years ago  ? CATARACT EXTRACTION  W/PHACO Left 08/31/2015  ? Procedure: CATARACT EXTRACTION PHACO AND INTRAOCULAR LENS PLACEMENT (IOC);  Surgeon: Sherald HessAnita Prakash Vin-Parikh, MD;  [facility];  Service: Ophthalmology;  Laterality: Left;  DIABETIC - oral meds  ? CATARACT EXTRACTION W/PHACO Right 10/12/2015  ? Procedure: CATARACT EXTRACTION PHACO AND INTRAOCULAR LENS PLACEMENT (IOC) right eye;  Surgeon: Sherald HessAnita Prakash Vin-Parikh, MD;  [facility];  Service: Ophthalmology;  Laterality: Right;  DIABETIC - oral meds  ? CHOLECYSTECTOMY  1994  ? COLONOSCOPY  2008,02/22/2012  ? Dr. Sankar-2013  ? COLONOSCOPY WITH PROPOFOL N/A 04/04/2017  ? Procedure: COLONOSCOPY WITH PROPOFOL;  Surgeon: Jessica BrightlySankar, Seeplaputhur G, MD;  [facility];  Service: Endoscopy;  Laterality: N/A;  ? POSTERIOR CERVICAL FUSION/FORAMINOTOMY N/A 04/01/2020  ? Procedure: POSTERIOR CERVICAL FUSION/FORAMINOTOMY CERVICAL FOUR- CERVICAL SIX;  Surgeon: Tressie StalkerJenkins, Jeffrey, MD;  [facility];  Service: Neurosurgery;  Laterality: N/A;  ? REFRACTIVE SURGERY    ? ? ?There were no vitals filed for this visit. ? ? Subjective Assessment - 09/13/21 0938   ? ? Subjective  Pt reports she drops items in both hands and continues to have dificulty using thumbs to manipulate objects.   ? Pertinent History Pt. is an  81 y.o. female who was diagnosed with Bilateral carpal tunnel syndrome. Pt. had a carpal tunnel release 05/2021. PMHx: Anterior cervical fusion, cervical spondylosis with myelopathy, and radiculopathy, RAS, Dissection of Abdominal Aorta, and HTN associated with DM.   ? Patient Stated Goals To be able to pin her quilting project   ? Currently in Pain? No/denies   ? ?  ?  ? ?  ? ? ?Paraffin: 10 min ?Paraffin for pain and muscle relaxation to R hand in prep for therapeutic exercise. ? ?Therapeutic Exercise ?Pt performed gross gripping with a gross grip strengthener. The Gripper was set to black spring 3rd hole 35# for L hand and white spring 5th hole 28.9# on R  hand. Pt interested in purchasing grippers for HEP, reviewed options from [place]. Pt worked on Marketing executive in B hands for lateral, and 3pt. pinch using green, blue, and black resistive clips. Pt. worked on placing the clips at various vertical and horizontal angles. Reviewed theraputty HEP - pt reports using green at home, encouraged to update OT when ready for blue putty.  ? ? ? ? ? ? ? ? ? OT Education - 09/13/21 0939   ? ? Education Details HEP   ? Person(s) Educated Patient   ? Methods Explanation   ? Comprehension Verbalized understanding;Returned demonstration;Verbal cues required   ? ?  ?  ? ?  ? ? ? OT Short Term Goals - 09/07/21 1151   ? ?  ? OT SHORT TERM GOAL #1  ? Title Pt. will be independent with HEPs   ? Baseline Eval: No current HEP.   ? Time 6   ? Period Weeks   ? Status New   ? Target Date 10/19/21   ?  ? OT SHORT TERM GOAL #2  ? Title Pt. will improve FOTO score by 2 pts for Pt. perceived performance improvement with assessment specifc ADL/IADLs   ? Baseline Eval:  FOTO score: 53, TR score: 61   ? Time 6   ? Period Weeks   ? Status New   ? Target Date 10/19/21   ? ?  ?  ? ?  ? ? ? ? OT Long Term Goals - 09/07/21 1154   ? ?  ? OT LONG TERM GOAL #1  ? Title Pt. will increase Right grip strength by 5# to improve hold on tools.   ? Baseline Eval: Grip strength: R: 18#, L: 35#   ? Time 12   ? Period Weeks   ? Status New   ? Target Date 11/30/21   ?  ? OT LONG TERM GOAL #2  ? Title Pt. will improve right lateral pinch strength by 2# to be able to assist with sewing tasks.   ? Baseline Eval: R: 7#, L: 13#   ? Time 12   ? Period Weeks   ? Status New   ? Target Date 11/30/21   ?  ? OT LONG TERM GOAL #3  ? Title Pt. will improve 3pt. pinch strength to be able to button independently, and efficiently   ? Baseline Eval: 3pt. pinch strength: R: 6#, L: 10#   ? Time 12   ? Period Weeks   ? Status New   ? Target Date 11/30/21   ?  ? OT LONG TERM GOAL #4  ? Title Pt. will improve [facility] skills with the right  hand in order to improve accuracy with managing quilting pins without dropping them.   ? Baseline Eval: R:  30 sec., L: 26 sec.   ? Time 12   ? Period Weeks   ? Status New   ? Target Date 11/30/21   ? ?  ?  ? ?  ? ? ? ? ? ? ? ? Plan - 09/13/21 0939   ? ? Clinical Impression Statement Pt reports already completing HEP, eager for updated grip/pinch home activities, reviewed grip strengthener and resistance clips with pt looking in to purchasing. Pt continues to drop items in R hand during session. Plan for [facility] activities next session. Pt. will continue to benefit from OT services to work on improving right hand strength, bilateral hand function, and coordinations skills in order to work towards improving engagement in ADLs, and IADL tasks.   ? OT Occupational Profile and History Detailed Assessment- Review of Records and additional review of physical, cognitive, psychosocial history related to current functional performance   ? Occupational performance deficits (Please refer to evaluation for details): ADL's;IADL's   ? Body Structure / Function / Physical Skills ADL;Endurance;Strength;Dexterity;UE functional use;IADL;ROM;Pain   ? Rehab Potential Fair   ? Clinical Decision Making Several treatment options, min-mod task modification necessary   ? Comorbidities Affecting Occupational Performance: May have comorbidities impacting occupational performance   ? Modification or Assistance to Complete Evaluation  Min-Moderate modification of tasks or assist with assess necessary to complete eval   ? OT Frequency 2x / week   ? OT Duration 12 weeks   ? OT Treatment/Interventions Self-care/ADL training;Ultrasound;Neuromuscular education;Paraffin;Therapeutic exercise;DME and/or AE instruction;Patient/family education;Therapeutic activities;Energy conservation;Moist Heat;Contrast Bath;Passive range of motion;Scar mobilization   ? Consulted and Agree with Plan of Care Patient   ? ?  ?  ? ?  ? ? ?Patient will benefit from skilled  therapeutic intervention in order to improve the following deficits and impairments:   ?Body Structure / Function / Physical Skills: ADL, Endurance, Strength, Dexterity, UE functional use, IADL, ROM, Pain ?

## 2021-09-14 IMAGING — CR DG THORACIC SPINE 3V
1 series · 3 of 3 positions shown · non-contrast
Comparison: C-spine series today

CLINICAL DATA: Heavy object fell on neck and back.

EXAM:
THORACIC SPINE - 3 VIEWS

[Series 1: dg thoracic spine w/swimmers · 0.14mm/px · 3 of 3 slices shown]
[im 1/3]
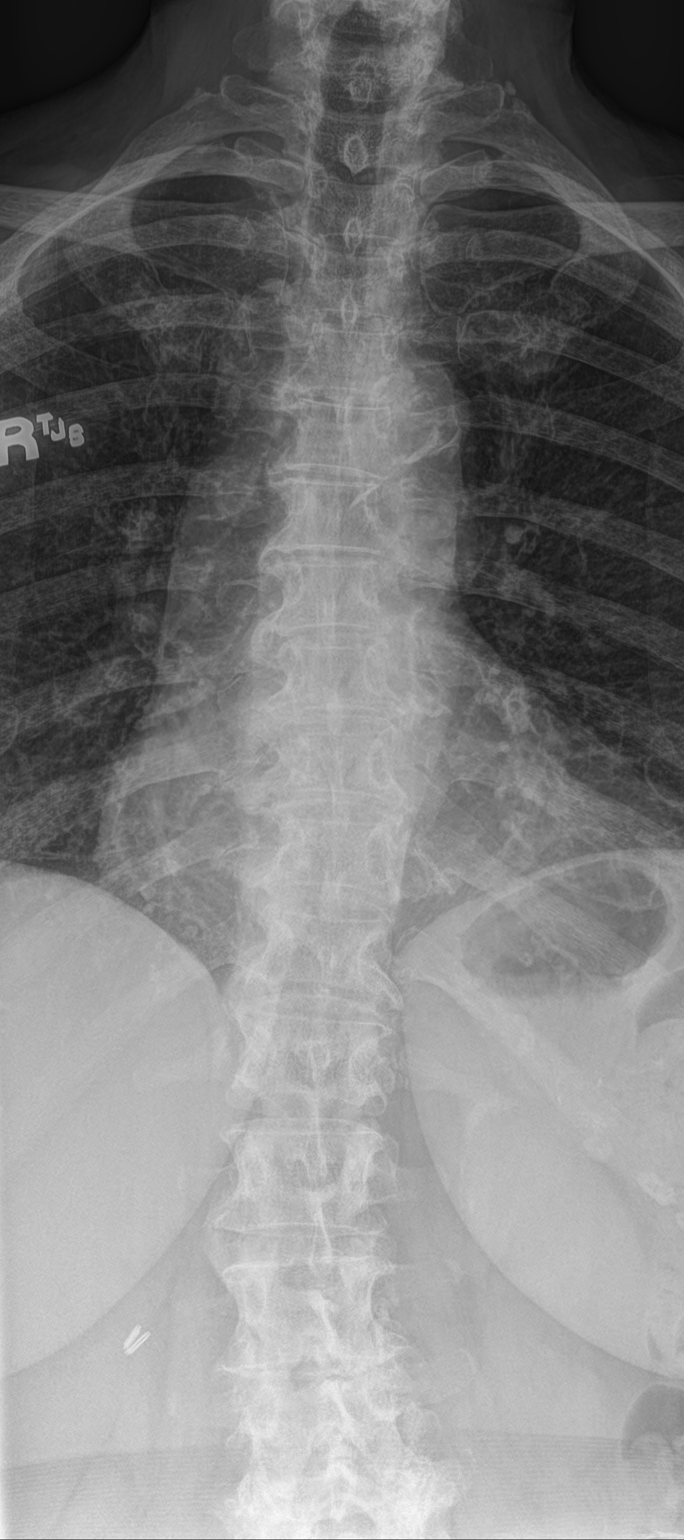
[im 2/3]
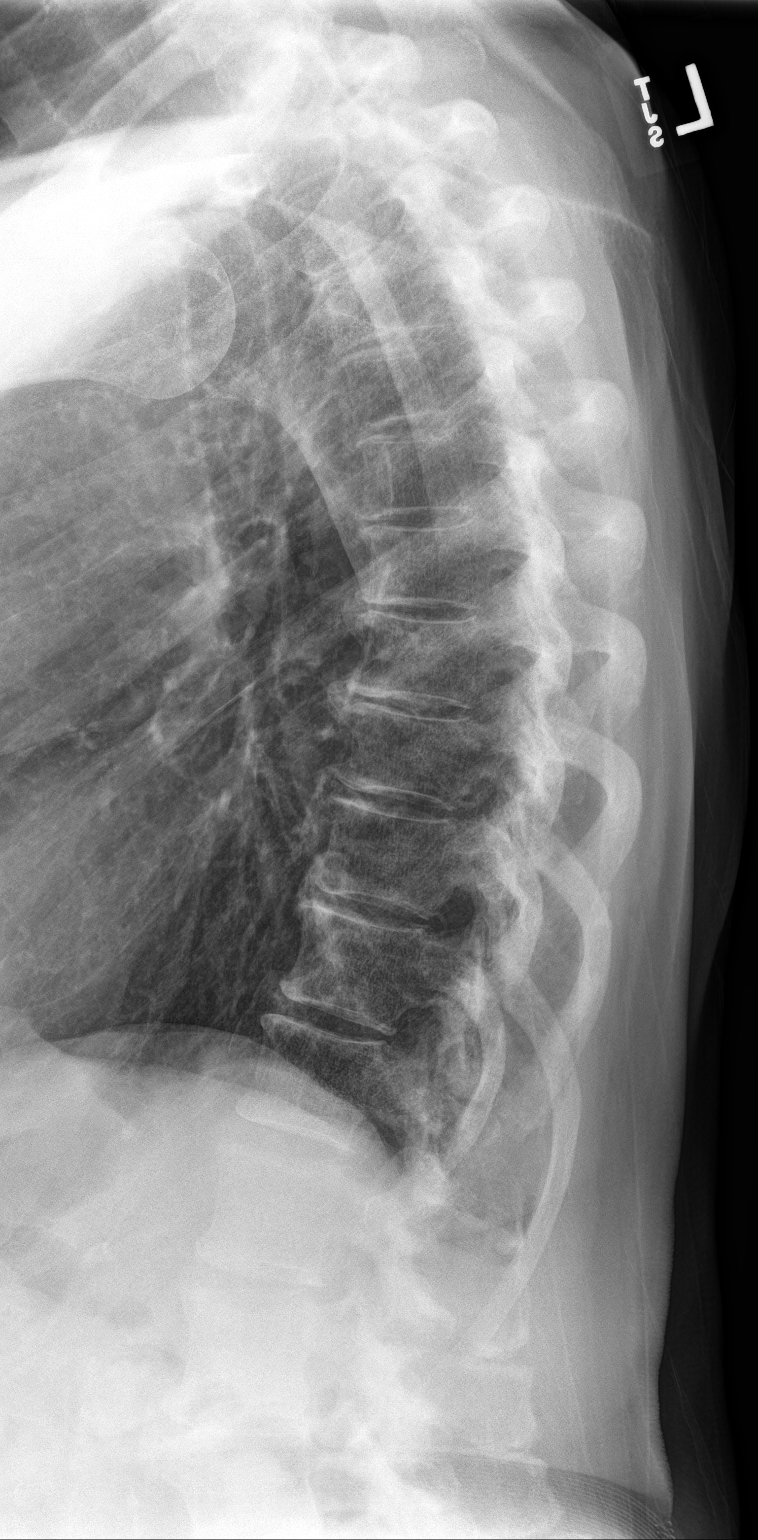
[im 3/3]
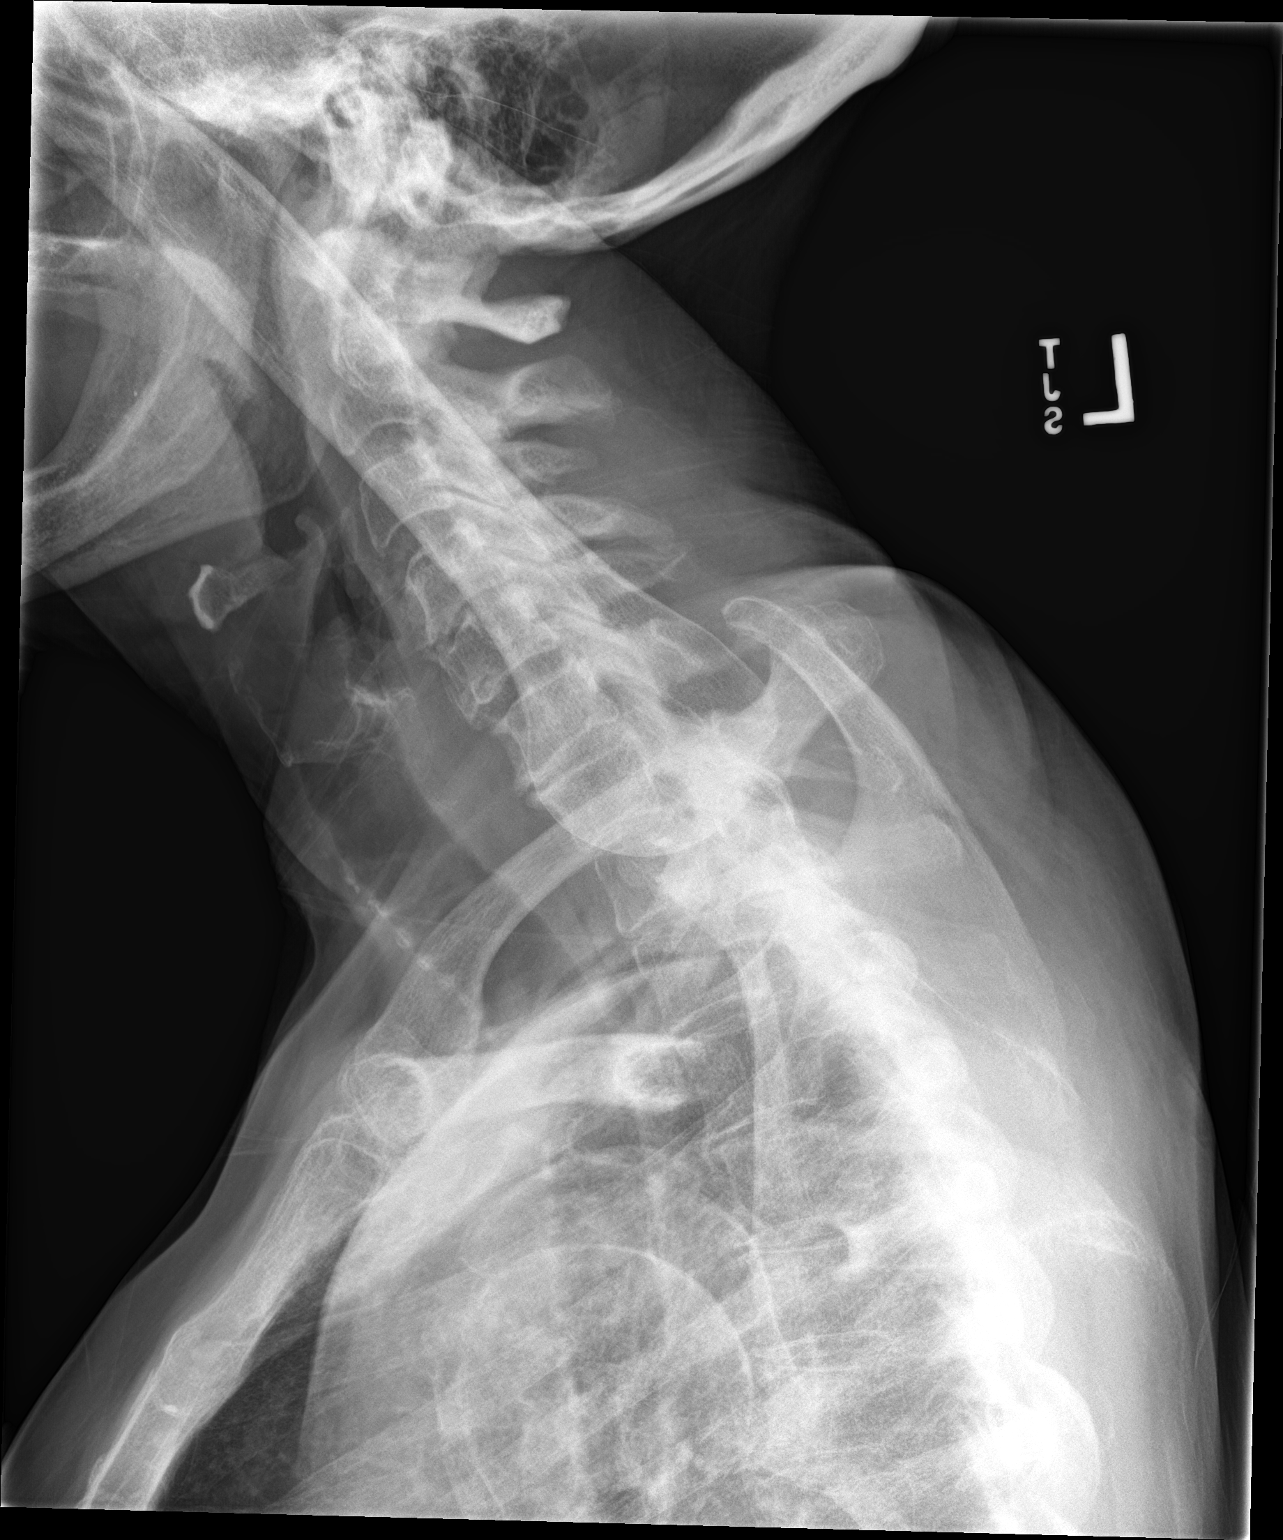

[3 of 3 positions shown; findings below may reference images not displayed]

FINDINGS: Fracture through the C7 spinous process again noted as seen on
C-spine series. No additional fracture. Diffuse degenerative changes
throughout the thoracic spine. No focal bone lesion or malalignment.
IMPRESSION: C7 spinous process fracture better seen on C-spine series.

Degenerative changes.

## 2021-09-14 IMAGING — CR DG CERVICAL SPINE COMPLETE 4+V
1 series · 6 of 6 positions shown · non-contrast
Comparison: None.

CLINICAL DATA: Heavy object fell on neck and back.  Pain.

EXAM:
CERVICAL SPINE - COMPLETE 4+ VIEW

[Series 1: dg cervical spine complete · 0.14mm/px · 6 of 6 slices shown]
[im 1/6]
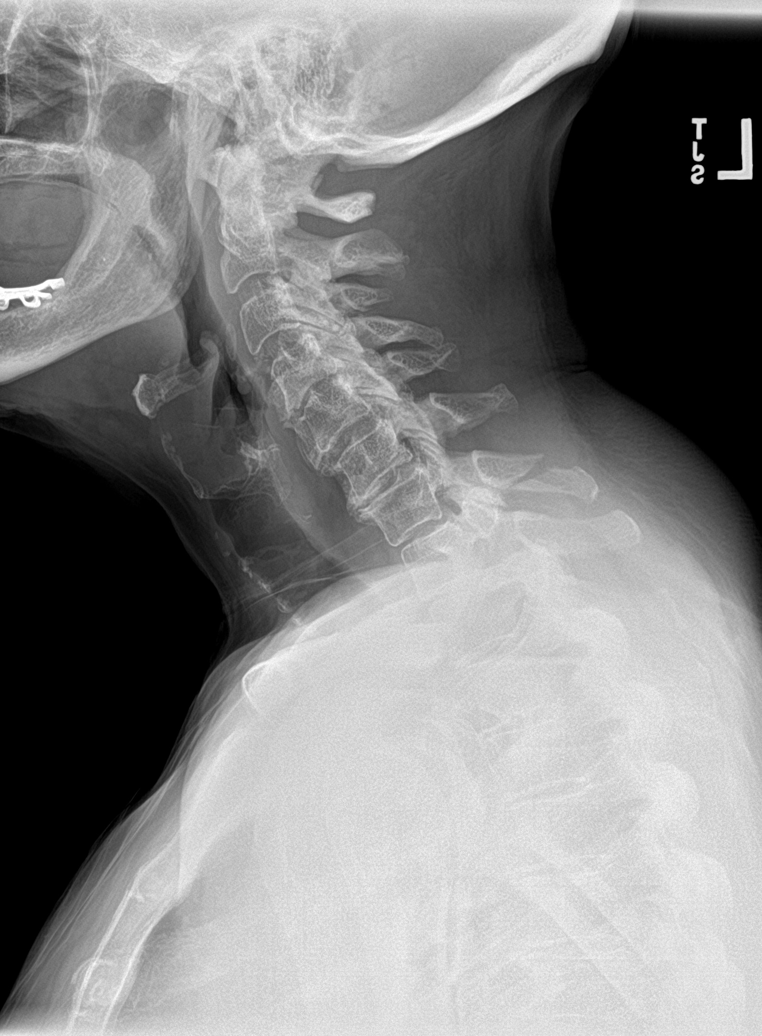
[im 2/6]
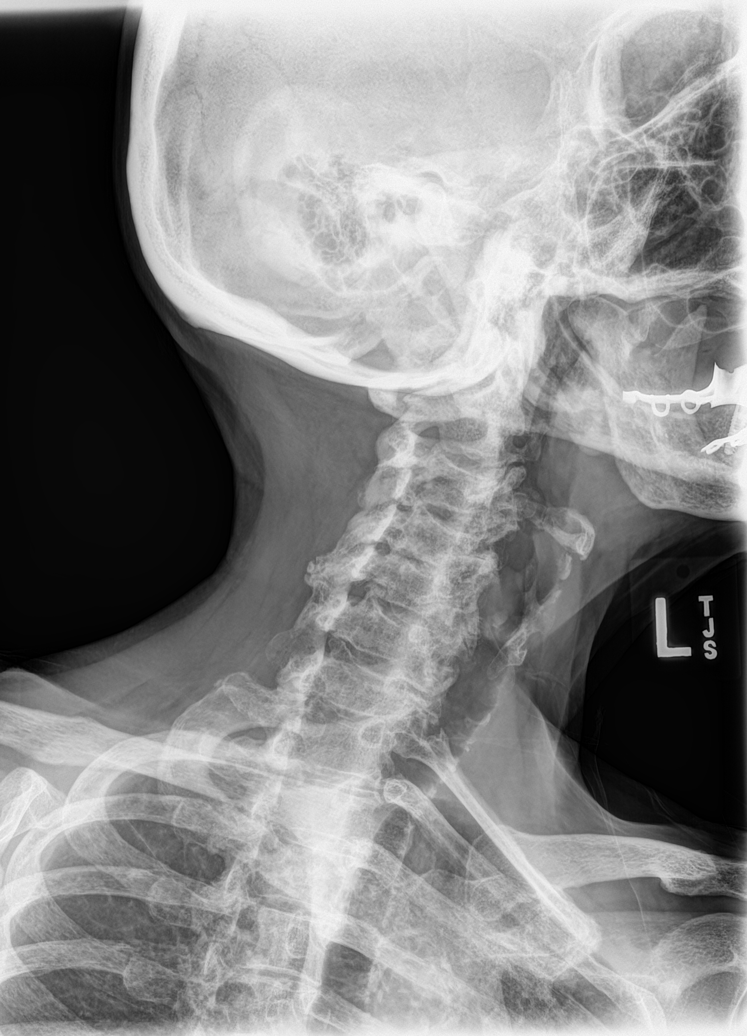
[im 3/6]
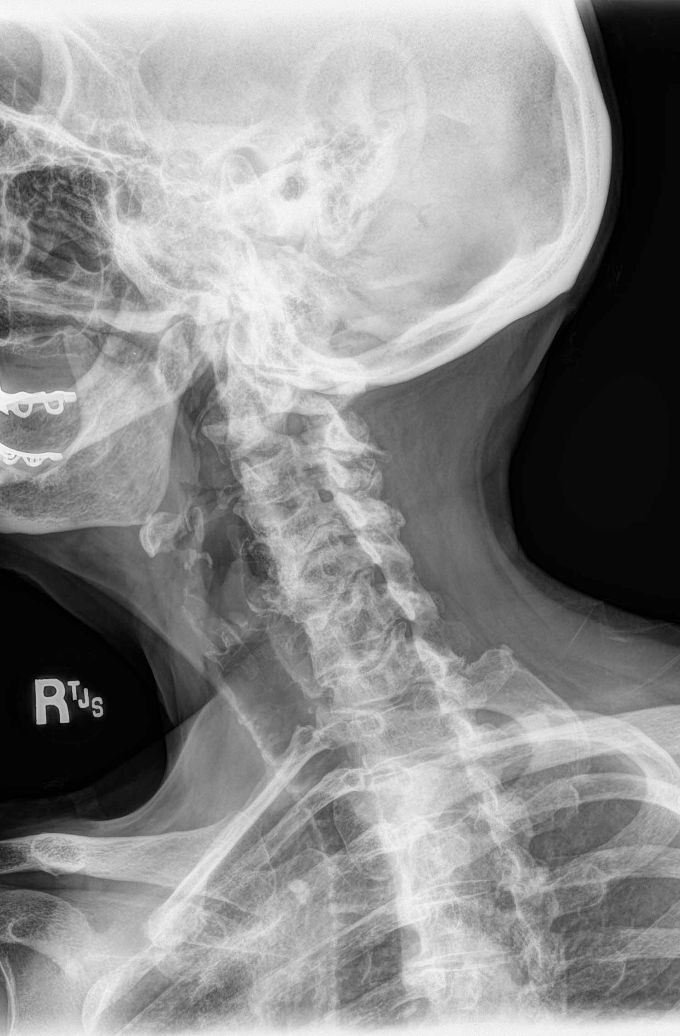
[im 4/6]
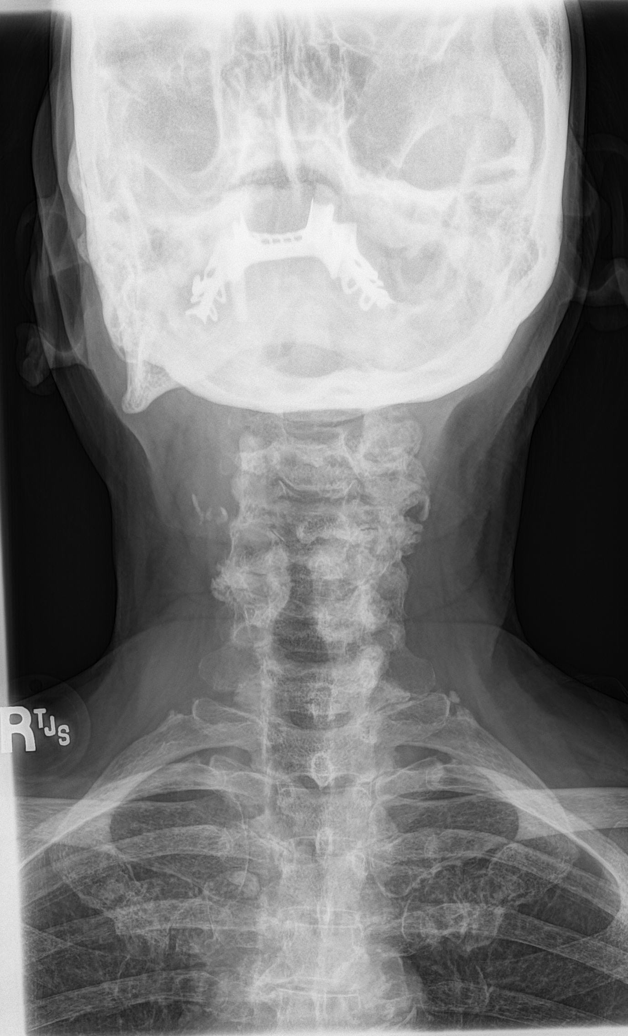
[im 5/6]
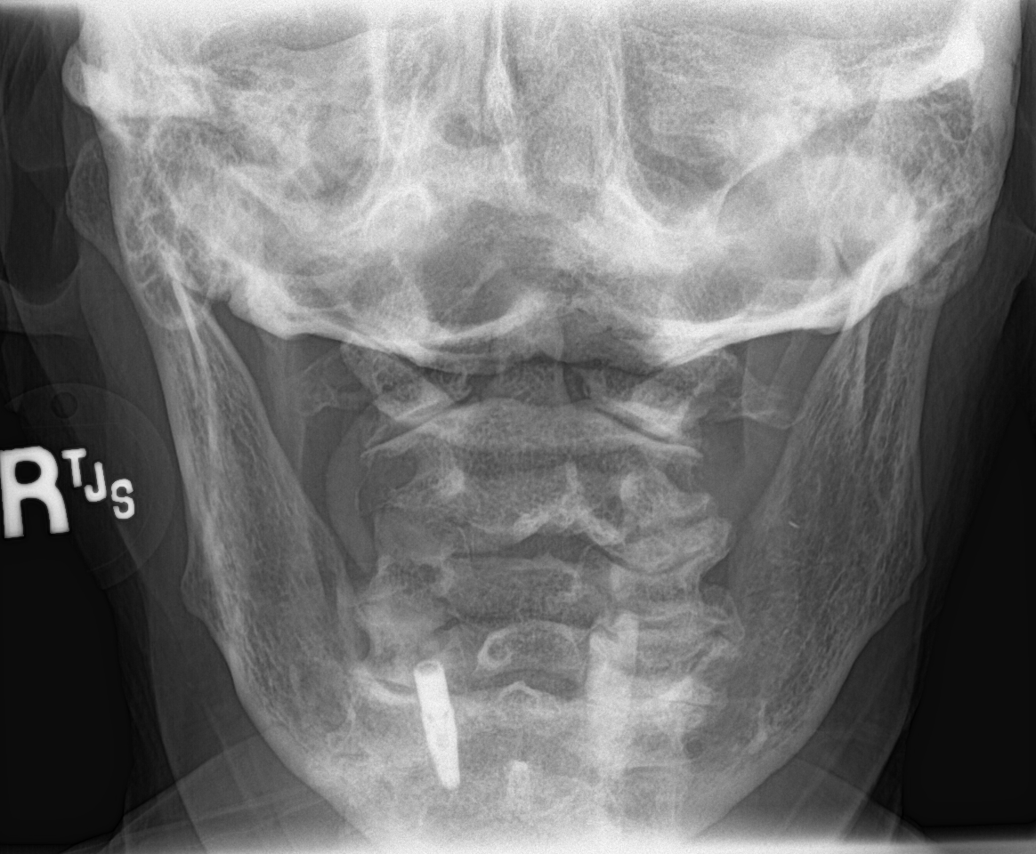
[im 6/6]
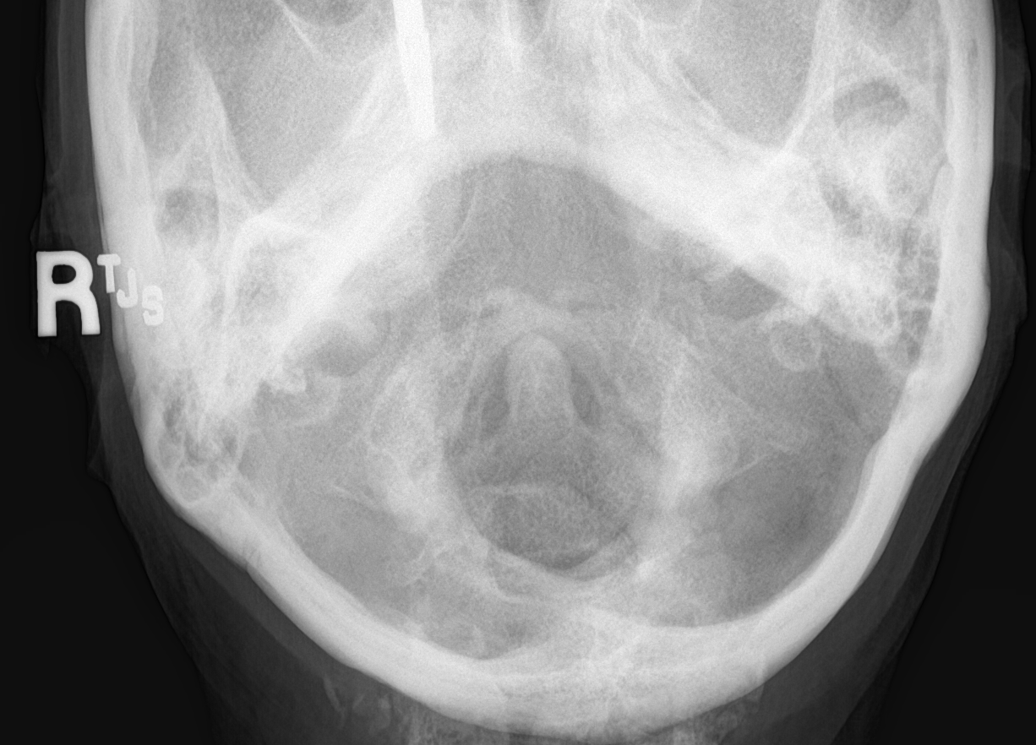

[6 of 6 positions shown; findings below may reference images not displayed]

FINDINGS: Diffuse degenerative disc and facet disease. Severe multilevel
bilateral neural foraminal narrowing. No malalignment. There is a
fracture through the C7 spinous process. Prevertebral soft tissues
are normal.
IMPRESSION: Displaced fracture through the C7 spinous process.

Advanced degenerative disc and facet disease.

## 2021-09-16 ENCOUNTER — Encounter: Payer: Self-pay | Admitting: Occupational Therapy

## 2021-09-16 ENCOUNTER — Ambulatory Visit: Payer: Medicare HMO | Admitting: Occupational Therapy

## 2021-09-16 DIAGNOSIS — M6281 Muscle weakness (generalized): Secondary | ICD-10-CM | POA: Diagnosis not present

## 2021-09-16 DIAGNOSIS — R278 Other lack of coordination: Secondary | ICD-10-CM

## 2021-09-16 NOTE — Therapy (Signed)
[place] MAIN [facility] SERVICES [address] [place], [place], [address] Phone: [phone]   Fax:  [phone]  Occupational Therapy Treatment  Patient Details  Name: Jessica Wheeler MRN: [id number] Date of Birth: 10/05/1940 Referring Provider (OT): Viona Gilmore, NP Surgeon Dr. Arnoldo Morale   Encounter Date: 09/16/2021   OT End of Session - 09/16/21 0919     Visit Number 3    Number of Visits 24    Date for OT Re-Evaluation 11/30/21    Authorization Type Progress report period starting  09/07/2021    OT Start Time 0830    OT Stop Time 0915    OT Time Calculation (min) 45 min    Activity Tolerance Patient tolerated treatment well;Patient limited by pain    Behavior During Therapy [facility] for tasks assessed/performed             Past Medical History:  Diagnosis Date   Allergy    Arthritis    Bundle branch block, right    C7 cervical fracture (HCC)    Diabetes mellitus    Diffuse cystic mastopathy    Dissection, 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[place])    Dr.Dew follows   Diverticulosis    Family history of malignant neoplasm of gastrointestinal tract    Glaucoma 2005   Heart murmur    History of bronchitis    History of pancreatitis 2005   Hyperlipidemia    Hypertension 1982   Personal history of tobacco use, presenting hazards to health    RBBB    Ulcer     Past Surgical History:  Procedure Laterality Date   ABDOMINAL HYSTERECTOMY  1991   ANTERIOR CERVICAL CORPECTOMY N/A 03/30/2020   Procedure: CORPECTOMY CERVICAL FOUR - CERVICAL FIVE;  Surgeon: Newman Pies, MD;  Location: [place];  Service: Neurosurgery;  Laterality: N/A;  anterior   APPENDECTOMY     BACK SURGERY  1982   for lumbar ruptured disc. No hardware, no discectomy, no fusion   BLADDER SURGERY     BREAST BIOPSY Right 1980's   benign   BREAST EXCISIONAL BIOPSY Right 1980s   benign   BREAST SURGERY     biopsy   CARDIAC CATHETERIZATION     Dr. Ubaldo Glassing, few years ago   CATARACT EXTRACTION  W/PHACO Left 08/31/2015   Procedure: CATARACT EXTRACTION PHACO AND INTRAOCULAR LENS PLACEMENT ([place]);  Surgeon: Ronnell Freshwater, MD;  Location: [place];  Service: Ophthalmology;  Laterality: Left;  DIABETIC - oral meds   CATARACT EXTRACTION W/PHACO Right 10/12/2015   Procedure: CATARACT EXTRACTION PHACO AND INTRAOCULAR LENS PLACEMENT ([place]) right eye;  Surgeon: Ronnell Freshwater, MD;  Location: [place];  Service: Ophthalmology;  Laterality: Right;  DIABETIC - oral meds   CHOLECYSTECTOMY  1994   COLONOSCOPY  2008,02/22/2012   Dr. Sankar-2013   COLONOSCOPY WITH PROPOFOL N/A 04/04/2017   Procedure: COLONOSCOPY WITH PROPOFOL;  Surgeon: Christene Lye, MD;  Location: [facility] ENDOSCOPY;  Service: Endoscopy;  Laterality: N/A;   POSTERIOR CERVICAL FUSION/FORAMINOTOMY N/A 04/01/2020   Procedure: POSTERIOR CERVICAL FUSION/FORAMINOTOMY CERVICAL FOUR- CERVICAL SIX;  Surgeon: Newman Pies, MD;  Location: [place];  Service: Neurosurgery;  Laterality: N/A;   REFRACTIVE SURGERY      There were no vitals filed for this visit.   Subjective Assessment - 09/16/21 0918     Subjective  Pt. reports that she ordered some hand strengthening items from [place].    Pertinent History Pt. is an 81 y.o. female who was diagnosed with  Bilateral carpal tunnel syndrome. Pt. had a carpal tunnel release 05/2021. PMHx: Anterior cervical fusion, cervical spondylosis with myelopathy, and radiculopathy, RAS, Dissection of Abdominal Aorta, and HTN associated with DM.    Patient Stated Goals To be able to pin her quilting project    Currently in Pain? No/denies             OT TREATMENT    Paraffin Bath:  Pt. Tolerated Paraffin Bath for 10 min. To the right hand with clear wrap, and a towel wrap secondary to stiffness.   Neuro muscular re-education:  Pt. worked on grasping 1/2" flat marbles, followed by flat coins, and 1" thin sticks. Pt. worked on translatory movements storing the  objects, and progressed to work on moving them through her hand from the palm to the tip of her 2nd digit, and thumb. Pt. worked in progression from larger to [employer]. Pt. worked on [facility] skills using the [employer] Task. Pt. worked on sustaining grasp on the resistive tweezers while grasping this sticks, and moving them from a horizontal position to a vertical position to prepare for placing them into the pegboard. Pt. Required verbal cues, and cues for visual demonstration for wrist position, and hand pattern when placing them into the pegboard.   Manual Therapy:  Pt. tolerated scar massage at the right carpal tunnel, and soft tissue massage to the thenar eminence. Manual therapy was performed independent of, and in preparation for neuromuscular re-education.  Pt. Reports that she used her right hand to pressure wash the driveway. Pt. reports that she has been doing the grip exercises. Pt. reports having intermittent radiating pain at times in her thumb at home, and that she tends to drop items out of her hand. Pt. had more difficulty performing translatory movements with smaller objects, dropping several 1" thin pegs from the ulnar aspect of her right hand. Pt. Was able to manipulate the tweezers to grasp the 1" sticks with the Jamar board positioned flat at the tabletop surface. Pt. Dropped multiple sticks from the tweezers when the board was positioned vertically to encourage wrist extension. Pt. Has difficulty with thumb opposition to the 5th digit. Pt. Continues to work on improving right hand function. Pt. Education was provided about home exercises, however was educated about the importance of finding a balance with strengthening without overdoing the exercises.                   OT Education - 09/16/21 0919     Education Details HEP    Person(s) Educated Patient    Methods Explanation    Comprehension Verbalized understanding;Returned demonstration;Verbal cues  required              OT Short Term Goals - 09/07/21 1151       OT SHORT TERM GOAL #1   Title Pt. will be independent with HEPs    Baseline Eval: No current HEP.    Time 6    Period Weeks    Status New    Target Date 10/19/21      OT SHORT TERM GOAL #2   Title Pt. will improve FOTO score by 2 pts for Pt. perceived performance improvement with assessment specifc ADL/IADLs    Baseline Eval:  FOTO score: 53, TR score: 61    Time 6    Period Weeks    Status New    Target Date 10/19/21  OT Long Term Goals - 09/07/21 1154       OT LONG TERM GOAL #1   Title Pt. will increase Right grip strength by 5# to improve hold on tools.    Baseline Eval: Grip strength: R: 18#, L: 35#    Time 12    Period Weeks    Status New    Target Date 11/30/21      OT LONG TERM GOAL #2   Title Pt. will improve right lateral pinch strength by 2# to be able to assist with sewing tasks.    Baseline Eval: R: 7#, L: 13#    Time 12    Period Weeks    Status New    Target Date 11/30/21      OT LONG TERM GOAL #3   Title Pt. will improve 3pt. pinch strength to be able to button independently, and efficiently    Baseline Eval: 3pt. pinch strength: R: 6#, L: 10#    Time 12    Period Weeks    Status New    Target Date 11/30/21      OT LONG TERM GOAL #4   Title Pt. will improve [facility] skills with the right hand in order to improve accuracy with managing quilting pins without dropping them.    Baseline Eval: R: 30 sec., L: 26 sec.    Time 12    Period Weeks    Status New    Target Date 11/30/21                   Plan - 09/16/21 0919     Clinical Impression Statement Pt. Reports that she used her right hand to pressure wash the driveway. Pt. reports that she has been doing the grip exercises. Pt. reports having intermittent radiating pain at times in her thumb at home, and that she tends to drop items out of her hand. Pt. had more difficulty performing translatory  movements with smaller objects, dropping several 1" thin pegs from the ulnar aspect of her right hand. Pt. Was able to manipulate the tweezers to grasp the 1" sticks with the Jamar board positioned flat at the tabletop surface. Pt. Dropped multiple sticks from the tweezers when the board was positioned vertically to encourage wrist extension. Pt. Has difficulty with thumb opposition to the 5th digit. Pt. Continues to work on improving right hand function. Pt. Education was provided about home exercises, however was educated about the importance of finding a balance with strengthening without overdoing the exercises.     OT Occupational Profile and History Detailed Assessment- Review of Records and additional review of physical, cognitive, psychosocial history related to current functional performance    Occupational performance deficits (Please refer to evaluation for details): ADL's;IADL's    Body Structure / Function / Physical Skills ADL;Endurance;Strength;Dexterity;UE functional use;IADL;ROM;Pain    Rehab Potential Fair    Clinical Decision Making Several treatment options, min-mod task modification necessary    Comorbidities Affecting Occupational Performance: May have comorbidities impacting occupational performance    Modification or Assistance to Complete Evaluation  Min-Moderate modification of tasks or assist with assess necessary to complete eval    OT Frequency 2x / week    OT Duration 12 weeks    OT Treatment/Interventions Self-care/ADL training;Ultrasound;Neuromuscular education;Paraffin;Therapeutic exercise;DME and/or AE instruction;Patient/family education;Therapeutic activities;Energy conservation;Moist Heat;Contrast Bath;Passive range of motion;Scar mobilization    Consulted and Agree with Plan of Care Patient             Patient  will benefit from skilled therapeutic intervention in order to improve the following deficits and impairments:   Body Structure / Function / Physical  Skills: ADL, Endurance, Strength, Dexterity, UE functional use, IADL, ROM, Pain       Visit Diagnosis: Muscle weakness (generalized)  Other lack of coordination    Problem List Patient Active Problem List   Diagnosis Date Noted   Carpal tunnel syndrome 07/13/2021   Numbness of hand 07/13/2021   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[place]) 07/13/2021   Spinal stenosis of lumbar region 07/13/2021   Cervical spondylosis with myelopathy and radiculopathy 04/01/2020   Lumbar adjacent segment disease with spondylolisthesis 03/30/2020   Overactive bladder 01/17/2020   Myalgia due to statin 12/13/2019   Spinal stenosis in cervical region 11/19/2019   Cervical spine pain 10/15/2019   Low back pain 03/26/2019   History of lumbar laminectomy 03/26/2019   Scoliosis deformity of spine 03/26/2019   Spondylolisthesis, grade 1 03/26/2019   Lumbar radiculopathy 03/26/2019   Hyperlipidemia associated with type 2 diabetes mellitus ([place]) 03/15/2019   Fatigue 12/11/2017   Primary osteoarthritis of left knee 03/10/2016   Depression 12/28/2015   Knee pain 11/26/2015   Allergic rhinitis 01/31/2015   Benign paroxysmal positional nystagmus 01/31/2015   Bladder infection, chronic 01/31/2015   Glaucoma 01/31/2015   Chronic hyponatremia 01/31/2015   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 01/31/2015   Anxiety 11/04/2014   Fibrocystic breast disease 04/30/2013   Urinary tract infection 11/01/2012   Renal artery stenosis ([place]) 04/18/2012   Chest pain 10/10/2011   Murmur 10/10/2011   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[place]) 03/31/2011   Hypertension associated with diabetes ([place]) 08/03/2010   CAD (coronary artery disease) 08/03/2010   DM (diabetes mellitus) ([place]) 08/03/2010   Harrel Carina, MS, OTR/L  Harrel Carina, OT 09/16/2021, 9:22 AM  [place] MAIN [facility] SERVICES [address] [place], [place], [address] Phone: [phone]    Fax:  [phone]  Name: RADIAH DONN MRN: [id number] Date of Birth: 11-25-1940

## 2021-09-20 ENCOUNTER — Encounter: Payer: Self-pay | Admitting: Occupational Therapy

## 2021-09-20 ENCOUNTER — Ambulatory Visit: Payer: Medicare HMO | Admitting: Occupational Therapy

## 2021-09-20 DIAGNOSIS — R278 Other lack of coordination: Secondary | ICD-10-CM | POA: Diagnosis not present

## 2021-09-20 DIAGNOSIS — M6281 Muscle weakness (generalized): Secondary | ICD-10-CM | POA: Diagnosis not present

## 2021-09-20 NOTE — Therapy (Addendum)
[facility] MAIN [facility] SERVICES [address] [place], [place], [address] Phone: [phone]   Fax:  [phone]  Occupational Therapy Treatment  Patient Details  Name: Jessica Wheeler MRN: [id number] Date of Birth: 1941/02/05 Referring Provider (OT): Val Eagle, NP Surgeon Dr. Daiva Eves Date: 09/20/2021   OT End of Session - 09/20/21 0945     Visit Number 4    Number of Visits 24    Date for OT Re-Evaluation 11/30/21    Authorization Type Progress report period starting  09/07/2021    OT Start Time 0830    OT Stop Time 0915    OT Time Calculation (min) 45 min    Activity Tolerance Patient tolerated treatment well;Patient limited by pain    Behavior During Therapy [facility] for tasks assessed/performed             Past Medical History:  Diagnosis Date   Allergy    Arthritis    Bundle branch block, right    C7 cervical fracture (HCC)    Diabetes mellitus    Diffuse cystic mastopathy    Dissection, aorta (HCC)    Dr.Dew follows   Diverticulosis    Family history of malignant neoplasm of gastrointestinal tract    Glaucoma 2005   Heart murmur    History of bronchitis    History of pancreatitis 2005   Hyperlipidemia    Hypertension 1982   Personal history of tobacco use, presenting hazards to health    RBBB    Ulcer     Past Surgical History:  Procedure Laterality Date   ABDOMINAL HYSTERECTOMY  1991   ANTERIOR CERVICAL CORPECTOMY N/A 03/30/2020   Procedure: CORPECTOMY CERVICAL FOUR - CERVICAL FIVE;  Surgeon: Tressie Stalker, MD;  Location: [facility] OR;  Service: Neurosurgery;  Laterality: N/A;  anterior   APPENDECTOMY     BACK SURGERY  1982   for lumbar ruptured disc. No hardware, no discectomy, no fusion   BLADDER SURGERY     BREAST BIOPSY Right 1980's   benign   BREAST EXCISIONAL BIOPSY Right 1980s   benign   BREAST SURGERY     biopsy   CARDIAC CATHETERIZATION     Dr. Lady Gary, few years ago   CATARACT EXTRACTION  W/PHACO Left 08/31/2015   Procedure: CATARACT EXTRACTION PHACO AND INTRAOCULAR LENS PLACEMENT ([facility]);  Surgeon: Sherald Hess, MD;  Location: [facility];  Service: Ophthalmology;  Laterality: Left;  DIABETIC - oral meds   CATARACT EXTRACTION W/PHACO Right 10/12/2015   Procedure: CATARACT EXTRACTION PHACO AND INTRAOCULAR LENS PLACEMENT ([facility]) right eye;  Surgeon: Sherald Hess, MD;  [facility]: [facility];  Service: Ophthalmology;  Laterality: Right;  DIABETIC - oral meds   CHOLECYSTECTOMY  1994   COLONOSCOPY  2008,02/22/2012   Dr. Sankar-2013   COLONOSCOPY WITH PROPOFOL N/A 04/04/2017   Procedure: COLONOSCOPY WITH PROPOFOL;  Surgeon: Jessica Brightly, MD;  Location: [facility] ENDOSCOPY;  Service: Endoscopy;  Laterality: N/A;   POSTERIOR CERVICAL FUSION/FORAMINOTOMY N/A 04/01/2020   Procedure: POSTERIOR CERVICAL FUSION/FORAMINOTOMY CERVICAL FOUR- CERVICAL SIX;  Surgeon: Tressie Stalker, MD;  Location: [facility] OR;  Service: Neurosurgery;  Laterality: N/A;   REFRACTIVE SURGERY      There were no vitals filed for this visit.   Subjective Assessment - 09/20/21 0840     Subjective  pt. reports that her upper back, and neck have been bothering her.    Pertinent History Pt. is an 81 y.o. female who was diagnosed  with Bilateral carpal tunnel syndrome. Pt. had a carpal tunnel release 05/2021. PMHx: Anterior cervical fusion, cervical spondylosis with myelopathy, and radiculopathy, RAS, Dissection of Abdominal Aorta, and HTN associated with DM.    Currently in Pain? Yes    Pain Score 4     Pain Orientation Mid;Upper   Radiating down into the right upper arm.   Pain Descriptors / Indicators Aching    Pain Type Acute pain    Pain Onset In the past 7 days            OT TREATMENT    Neuro muscular re-education:  Pt. worked on right hand [facility] skills grasping small 1" sticks, 1/4" collars, and 1/4" washers, storing them in the palm of her right hand, and translatory  movements moving them from her palm to the tip of her 2nd digit, and thumb in preparation for placing them into the [employer] Pegboard. Pt. worked on Therapist, occupational backings, and placed them onto a small resistive dowel. Pt. requires visual cues, and cues for visual demonstration for proper technique.    Manual Therapy:   Pt. tolerated scar massage at the right carpal tunnel, and soft tissue massage to the thenar eminence. Manual therapy was performed independent of, and in preparation for neuromuscular re-education.    Pt. reports having 4/10 pain in her upper mid back, and radiating into her right shoulder after attempting to pick up a large full planter this weekend. Pt. reports that it was too heavy. Pt. reports knee discomfort this morning. Pt. reports that she has been working on hand strengthening with theraputty at home, and coordination skills with small dried beans. Pt. continues to present with difficulty performing translatory movements with the right hand. Pt. Reports that she really has to concentrate on, and look at the objects in her hand when formulating the movements. Pt. continues to work on improving UE functioning in order to work towards improving, and maximizing independence with ADLs, and IADL tasks.                     OT Education - 09/20/21 0945     Education Details HEP    Person(s) Educated Patient    Methods Explanation    Comprehension Verbalized understanding;Returned demonstration;Verbal cues required              OT Short Term Goals - 09/07/21 1151       OT SHORT TERM GOAL #1   Title Pt. will be independent with HEPs    Baseline Eval: No current HEP.    Time 6    Period Weeks    Status New    Target Date 10/19/21      OT SHORT TERM GOAL #2   Title Pt. will improve FOTO score by 2 pts for Pt. perceived performance improvement with assessment specifc ADL/IADLs    Baseline Eval:  FOTO score: 53, TR score: 61    Time 6     Period Weeks    Status New    Target Date 10/19/21               OT Long Term Goals - 09/07/21 1154       OT LONG TERM GOAL #1   Title Pt. will increase Right grip strength by 5# to improve hold on tools.    Baseline Eval: Grip strength: R: 18#, L: 35#    Time 12    Period Weeks    Status New  Target Date 11/30/21      OT LONG TERM GOAL #2   Title Pt. will improve right lateral pinch strength by 2# to be able to assist with sewing tasks.    Baseline Eval: R: 7#, L: 13#    Time 12    Period Weeks    Status New    Target Date 11/30/21      OT LONG TERM GOAL #3   Title Pt. will improve 3pt. pinch strength to be able to button independently, and efficiently    Baseline Eval: 3pt. pinch strength: R: 6#, L: 10#    Time 12    Period Weeks    Status New    Target Date 11/30/21      OT LONG TERM GOAL #4   Title Pt. will improve [facility] skills with the right hand in order to improve accuracy with managing quilting pins without dropping them.    Baseline Eval: R: 30 sec., L: 26 sec.    Time 12    Period Weeks    Status New    Target Date 11/30/21                   Plan - 09/20/21 0946     Clinical Impression Statement Pt. reports having 4/10 pain in her upper mid back, and radiating into her right shoulder after attempting to pick up a large full planter this weekend. Pt. reports that it was too heavy. Pt. reports knee discomfort this morning. Pt. reports that she has been working on hand strengthening with theraputty at home, and coordination skills with small dried beans. Pt. continues to present with difficulty performing translatory movements with the right hand. Pt. Reports that she really has to concentrate on, and look at the objects in her hand when formulating the movements. Pt. continues to work on improving UE functioning in order to work towards improving, and maximizing independence with ADLs, and IADL tasks.       OT Occupational Profile and History  Detailed Assessment- Review of Records and additional review of physical, cognitive, psychosocial history related to current functional performance    Occupational performance deficits (Please refer to evaluation for details): ADL's;IADL's    Body Structure / Function / Physical Skills ADL;Endurance;Strength;Dexterity;UE functional use;IADL;ROM;Pain    Rehab Potential Fair    Clinical Decision Making Several treatment options, min-mod task modification necessary    Comorbidities Affecting Occupational Performance: May have comorbidities impacting occupational performance    Modification or Assistance to Complete Evaluation  Min-Moderate modification of tasks or assist with assess necessary to complete eval    OT Frequency 2x / week    OT Duration 12 weeks    OT Treatment/Interventions Self-care/ADL training;Ultrasound;Neuromuscular education;Paraffin;Therapeutic exercise;DME and/or AE instruction;Patient/family education;Therapeutic activities;Energy conservation;Moist Heat;Contrast Bath;Passive range of motion;Scar mobilization    Consulted and Agree with Plan of Care Patient             Patient will benefit from skilled therapeutic intervention in order to improve the following deficits and impairments:   Body Structure / Function / Physical Skills: ADL, Endurance, Strength, Dexterity, UE functional use, IADL, ROM, Pain       Visit Diagnosis: Muscle weakness (generalized)  Other lack of coordination    Problem List Patient Active Problem List   Diagnosis Date Noted   Carpal tunnel syndrome 07/13/2021   Numbness of hand 07/13/2021   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HCC) 07/13/2021   Spinal stenosis of lumbar region 07/13/2021  Cervical spondylosis with myelopathy and radiculopathy 04/01/2020   Lumbar adjacent segment disease with spondylolisthesis 03/30/2020   Overactive bladder 01/17/2020   Myalgia due to statin 12/13/2019   Spinal stenosis in  cervical region 11/19/2019   Cervical spine pain 10/15/2019   Low back pain 03/26/2019   History of lumbar laminectomy 03/26/2019   Scoliosis deformity of spine 03/26/2019   Spondylolisthesis, grade 1 03/26/2019   Lumbar radiculopathy 03/26/2019   Hyperlipidemia associated with type 2 diabetes mellitus (HCC) 03/15/2019   Fatigue 12/11/2017   Primary osteoarthritis of left knee 03/10/2016   Depression 12/28/2015   Knee pain 11/26/2015   Allergic rhinitis 01/31/2015   Benign paroxysmal positional nystagmus 01/31/2015   Bladder infection, chronic 01/31/2015   Glaucoma 01/31/2015   Chronic hyponatremia 01/31/2015   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 [address] [place], [place], [address] Phone: [phone]   Fax:  [phone]  Name: Jessica Wheeler MRN: [id number] Date of Birth: Jun 17, 1940

## 2021-09-23 ENCOUNTER — Encounter: Payer: Self-pay | Admitting: Occupational Therapy

## 2021-09-23 ENCOUNTER — Ambulatory Visit: Payer: Medicare HMO | Admitting: Occupational Therapy

## 2021-09-23 DIAGNOSIS — R278 Other lack of coordination: Secondary | ICD-10-CM | POA: Diagnosis not present

## 2021-09-23 DIAGNOSIS — M6281 Muscle weakness (generalized): Secondary | ICD-10-CM

## 2021-09-23 NOTE — Therapy (Signed)
[facility] MAIN [facility] SERVICES [address] [place], [place], [address] Phone: [phone]   Fax:  [phone]  Occupational Therapy Treatment  Patient Details  Name: Jessica Wheeler MRN: [id number] Date of Birth: [birth year] Referring Provider (OT): Val Eagle, NP Surgeon Dr. Daiva Eves Date: 09/23/2021   OT End of Session - 09/23/21 1550     Visit Number 5    Number of Visits 24    Date for OT Re-Evaluation 11/30/21    Authorization Type Progress report period starting  09/07/2021    OT Start Time 0838    OT Stop Time 0930    OT Time Calculation (min) 52 min    Activity Tolerance Patient tolerated treatment well;Patient limited by pain    Behavior During Therapy [facility] for tasks assessed/performed             Past Medical History:  Diagnosis Date   Allergy    Arthritis    Bundle branch block, right    C7 cervical fracture (HCC)    Diabetes mellitus    Diffuse cystic mastopathy    Dissection, aorta (HCC)    Dr.Dew follows   Diverticulosis    Family history of malignant neoplasm of gastrointestinal tract    Glaucoma 2005   Heart murmur    History of bronchitis    History of pancreatitis 2005   Hyperlipidemia    Hypertension 1982   Personal history of tobacco use, presenting hazards to health    RBBB    Ulcer     Past Surgical History:  Procedure Laterality Date   ABDOMINAL HYSTERECTOMY  1991   ANTERIOR CERVICAL CORPECTOMY N/A 03/30/2020   Procedure: CORPECTOMY CERVICAL FOUR - CERVICAL FIVE;  Surgeon: Tressie Stalker, MD;  Location: [facility] OR;  Service: Neurosurgery;  Laterality: N/A;  anterior   APPENDECTOMY     BACK SURGERY  1982   for lumbar ruptured disc. No hardware, no discectomy, no fusion   BLADDER SURGERY     BREAST BIOPSY Right 1980's   benign   BREAST EXCISIONAL BIOPSY Right 1980s   benign   BREAST SURGERY     biopsy   CARDIAC CATHETERIZATION     Dr. Lady Gary, few years ago   CATARACT EXTRACTION  W/PHACO Left 08/31/2015   Procedure: CATARACT EXTRACTION PHACO AND INTRAOCULAR LENS PLACEMENT (IOC);  Surgeon: Sherald Hess, MD;  Location: [facility];  Service: Ophthalmology;  Laterality: Left;  DIABETIC - oral meds   CATARACT EXTRACTION W/PHACO Right 10/12/2015   Procedure: CATARACT EXTRACTION PHACO AND INTRAOCULAR LENS PLACEMENT (IOC) right eye;  Surgeon: Sherald Hess, MD;  Location: [facility];  Service: Ophthalmology;  Laterality: Right;  DIABETIC - oral meds   CHOLECYSTECTOMY  1994   COLONOSCOPY  2008,02/22/2012   Dr. Sankar-2013   COLONOSCOPY WITH PROPOFOL N/A 04/04/2017   Procedure: COLONOSCOPY WITH PROPOFOL;  Surgeon: Jessica Brightly, MD;  Location: [facility] ENDOSCOPY;  Service: Endoscopy;  Laterality: N/A;   POSTERIOR CERVICAL FUSION/FORAMINOTOMY N/A 04/01/2020   Procedure: POSTERIOR CERVICAL FUSION/FORAMINOTOMY CERVICAL FOUR- CERVICAL SIX;  Surgeon: Tressie Stalker, MD;  Location: [facility] OR;  Service: Neurosurgery;  Laterality: N/A;   REFRACTIVE SURGERY      There were no vitals filed for this visit.   Subjective Assessment - 09/23/21 1548     Subjective  Pt. brough in a sample of her quiting project that she had been working on.    Patient is accompanied by: Family member  Pertinent History Pt. is an 81 y.o. female who was diagnosed with Bilateral carpal tunnel syndrome. Pt. had a carpal tunnel release 05/2021. PMHx: Anterior cervical fusion, cervical spondylosis with myelopathy, and radiculopathy, RAS, Dissection of Abdominal Aorta, and HTN associated with DM.    Currently in Pain? Yes    Pain Score 5     Pain Location Neck    Pain Orientation Upper    Pain Descriptors / Indicators Aching    Pain Type Chronic pain;Acute pain    Pain Radiating Towards the right shoulder            OT TREATMENT     Therapeutic Ex:  Pt. worked on instrinsic stretches/tendon glides for the right hand. Pt. was provided with a HEP visual handout  sheet. Pt. required cues for visual demonstration, and cue for proper technique.     Manual Therapy:   Pt. tolerated scar massage at the right carpal tunnel, and soft tissue massage to the thenar eminence secondary to stiffness. Manual therapy was performed independent of, and in preparation for neuromuscular re-education.  Self-care: Pt. worked on securing 2 layers of fabric at the tabletop with pins using her 3rd digit, and thumb. Pt. was able to grasp the pins when the pins were positioned vertically in the pin cushion. Pt. reports that her pins are typically positioned flat in a container, and that she is unable to handle them in that position with her right hand. Pt. Education was provided about work simplification strategies, and joint protection principles with quilting tasks.      Pt. reports having 5/10 pain in her upper mid back, and radiating into her right shoulder. Pt. education was provided about posture, and work simplification strategies with ADL, and quilting tasks. Pt. Was able to grasp pins from a vertical position in the pin cushion, and fasten to layers of fabric with the pins using her 3rd digit, and thumb. Pt. Was not able to engage her 2nd digit due to decreased sensation in the tip of her thumb. Pt. reports that she usually uses a thimble on her 3rd digit, and her 2nd digit, and thumb to push the pins through the quilting fabric, and paper. Pt. reports knee discomfort this morning, and had to modify her early morning track walk to taking the dog for a walk.  Pt. continues to present with difficulty performing translatory movements with the right hand. Pt. reports that she really has to concentrate on, and look at the objects in her hand when formulating the movements. Pt. continues to work on improving UE functioning in order to work towards improving, and maximizing independence with ADLs, and IADL tasks.                           OT Education - 09/23/21 1550      Education Details HEP    Person(s) Educated Patient    Methods Explanation    Comprehension Verbalized understanding;Returned demonstration;Verbal cues required              OT Short Term Goals - 09/07/21 1151       OT SHORT TERM GOAL #1   Title Pt. will be independent with HEPs    Baseline Eval: No current HEP.    Time 6    Period Weeks    Status New    Target Date 10/19/21      OT SHORT TERM GOAL #2   Title Pt. will improve  FOTO score by 2 pts for Pt. perceived performance improvement with assessment specifc ADL/IADLs    Baseline Eval:  FOTO score: 53, TR score: 61    Time 6    Period Weeks    Status New    Target Date 10/19/21               OT Long Term Goals - 09/07/21 1154       OT LONG TERM GOAL #1   Title Pt. will increase Right grip strength by 5# to improve hold on tools.    Baseline Eval: Grip strength: R: 18#, L: 35#    Time 12    Period Weeks    Status New    Target Date 11/30/21      OT LONG TERM GOAL #2   Title Pt. will improve right lateral pinch strength by 2# to be able to assist with sewing tasks.    Baseline Eval: R: 7#, L: 13#    Time 12    Period Weeks    Status New    Target Date 11/30/21      OT LONG TERM GOAL #3   Title Pt. will improve 3pt. pinch strength to be able to button independently, and efficiently    Baseline Eval: 3pt. pinch strength: R: 6#, L: 10#    Time 12    Period Weeks    Status New    Target Date 11/30/21      OT LONG TERM GOAL #4   Title Pt. will improve [facility] skills with the right hand in order to improve accuracy with managing quilting pins without dropping them.    Baseline Eval: R: 30 sec., L: 26 sec.    Time 12    Period Weeks    Status New    Target Date 11/30/21                   Plan - 09/23/21 1551     Clinical Impression Statement Pt. reports having 5/10 pain in her upper mid back, and radiating into her right shoulder. Pt. education was provided about posture, and work  simplification strategies with ADL, and quilting tasks. Pt. Was able to grasp pins from a vertical position in the pin cushion, and fasten to layers of fabric with the pins using her 3rd digit, and thumb. Pt. Was not able to engage her 2nd digit due to decreased sensation in the tip of her thumb. Pt. reports that she usually uses a thimble on her 3rd digit, and her 2nd digit, and thumb to push the pins through the quilting fabric, and paper. Pt. reports knee discomfort this morning, and had to modify her early morning track walk to taking the dog for a walk.  Pt. continues to present with difficulty performing translatory movements with the right hand. Pt. reports that she really has to concentrate on, and look at the objects in her hand when formulating the movements. Pt. continues to work on improving UE functioning in order to work towards improving, and maximizing independence with ADLs, and IADL tasks.      OT Occupational Profile and History Detailed Assessment- Review of Records and additional review of physical, cognitive, psychosocial history related to current functional performance    Occupational performance deficits (Please refer to evaluation for details): ADL's;IADL's    Body Structure / Function / Physical Skills ADL;Endurance;Strength;Dexterity;UE functional use;IADL;ROM;Pain    Rehab Potential Fair    Clinical Decision Making Several treatment options, min-mod task  modification necessary    Comorbidities Affecting Occupational Performance: May have comorbidities impacting occupational performance    Modification or Assistance to Complete Evaluation  Min-Moderate modification of tasks or assist with assess necessary to complete eval    OT Frequency 2x / week    OT Duration 12 weeks    OT Treatment/Interventions Self-care/ADL training;Ultrasound;Neuromuscular education;Paraffin;Therapeutic exercise;DME and/or AE instruction;Patient/family education;Therapeutic activities;Energy  conservation;Moist Heat;Contrast Bath;Passive range of motion;Scar mobilization    Consulted and Agree with Plan of Care Patient             Patient will benefit from skilled therapeutic intervention in order to improve the following deficits and impairments:   Body Structure / Function / Physical Skills: ADL, Endurance, Strength, Dexterity, UE functional use, IADL, ROM, Pain       Visit Diagnosis: Muscle weakness (generalized)    Problem List Patient Active Problem List   Diagnosis Date Noted   Carpal tunnel syndrome 07/13/2021   Numbness of hand 07/13/2021   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HCC) 07/13/2021   Spinal stenosis of lumbar region 07/13/2021   Cervical spondylosis with myelopathy and radiculopathy 04/01/2020   Lumbar adjacent segment disease with spondylolisthesis 03/30/2020   Overactive bladder 01/17/2020   Myalgia due to statin 12/13/2019   Spinal stenosis in cervical region 11/19/2019   Cervical spine pain 10/15/2019   Low back pain 03/26/2019   History of lumbar laminectomy 03/26/2019   Scoliosis deformity of spine 03/26/2019   Spondylolisthesis, grade 1 03/26/2019   Lumbar radiculopathy 03/26/2019   Hyperlipidemia associated with type 2 diabetes mellitus (HCC) 03/15/2019   Fatigue 12/11/2017   Primary osteoarthritis of left knee 03/10/2016   Depression 12/28/2015   Knee pain 11/26/2015   Allergic rhinitis 01/31/2015   Benign paroxysmal positional nystagmus 01/31/2015   Bladder infection, chronic 01/31/2015   Glaucoma 01/31/2015   Chronic hyponatremia 01/31/2015   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 01/31/2015   Anxiety 11/04/2014   Fibrocystic breast disease 04/30/2013   Urinary tract infection 11/01/2012   Renal artery stenosis (HCC) 04/18/2012   Chest pain 10/10/2011   Murmur 10/10/2011   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HCC) 03/31/2011   Hypertension associated with diabetes (HCC) 08/03/2010   CAD (coronary artery  disease) 08/03/2010   DM (diabetes mellitus) (HCC) 08/03/2010   Olegario MessierElaine Aadhav Uhlig, MS, OTR/L   Olegario MessierElaine Shreyas Piatkowski, OT 09/23/2021, 3:58 PM  [facility] MAIN [facility] [address] [place], [place], [address] Phone: [phone]   Fax:  [phone]  Name: Jessica Wheeler MRN: [id number] Date of Birth: 25-Sep-1940

## 2021-09-28 ENCOUNTER — Encounter: Payer: Self-pay | Admitting: Occupational Therapy

## 2021-09-28 ENCOUNTER — Ambulatory Visit: Payer: Medicare HMO | Admitting: Occupational Therapy

## 2021-09-28 DIAGNOSIS — M6281 Muscle weakness (generalized): Secondary | ICD-10-CM | POA: Diagnosis not present

## 2021-09-28 DIAGNOSIS — R278 Other lack of coordination: Secondary | ICD-10-CM

## 2021-09-28 NOTE — Therapy (Signed)
[facility] [address] [place], [place], [address] Phone: [phone]   Fax:  [phone]  Occupational Therapy Treatment  Patient Details  Name: Jessica Wheeler MRN: [id number] Date of Birth: Sep 05, 1940 Referring Provider (OT): Val EagleMeghan Bergman, NP Surgeon Dr. Daiva EvesJenkins   Encounter Date: 09/28/2021   OT End of Session - 09/28/21 1200     Visit Number 6    Number of Visits 24    Date for OT Re-Evaluation 11/30/21    Authorization Type Progress report period starting  09/07/2021    OT Start Time 0830    OT Stop Time 0915    OT Time Calculation (min) 45 min    Activity Tolerance Patient tolerated treatment well;Patient limited by pain    Behavior [facility] for tasks assessed/performed             Past Medical History:  Diagnosis Date   Allergy    Arthritis    Bundle branch block, right    C7 cervical fracture (HCC)    Diabetes mellitus    Diffuse cystic mastopathy    Dissection, aorta (HCC)    Dr.Dew follows   Diverticulosis    Family history of malignant neoplasm of gastrointestinal tract    Glaucoma 2005   Heart murmur    History of bronchitis    History of pancreatitis 2005   Hyperlipidemia    Hypertension 1982   Personal history of tobacco use, presenting hazards to health    RBBB    Ulcer     Past Surgical History:  Procedure Laterality Date   ABDOMINAL HYSTERECTOMY  1991   ANTERIOR CERVICAL CORPECTOMY N/A 03/30/2020   Procedure: CORPECTOMY CERVICAL FOUR - CERVICAL FIVE;  Surgeon: Tressie StalkerJenkins, Jeffrey, MD;  [facility];  Service: Neurosurgery;  Laterality: N/A;  anterior   APPENDECTOMY     BACK SURGERY  1982   for lumbar ruptured disc. No hardware, no discectomy, no fusion   BLADDER SURGERY     BREAST BIOPSY Right 1980's   benign   BREAST EXCISIONAL BIOPSY Right 1980s   benign   BREAST SURGERY     biopsy   CARDIAC CATHETERIZATION     Dr. Lady GaryFath, few years ago   CATARACT EXTRACTION  W/PHACO Left 08/31/2015   Procedure: CATARACT EXTRACTION PHACO AND INTRAOCULAR LENS PLACEMENT (IOC);  Surgeon: Sherald HessAnita Prakash Vin-Parikh, MD;  [facility];  Service: Ophthalmology;  Laterality: Left;  DIABETIC - oral meds   CATARACT EXTRACTION W/PHACO Right 10/12/2015   Procedure: CATARACT EXTRACTION PHACO AND INTRAOCULAR LENS PLACEMENT (IOC) right eye;  Surgeon: Sherald HessAnita Prakash Vin-Parikh, MD;  [facility];  Service: Ophthalmology;  Laterality: Right;  DIABETIC - oral meds   CHOLECYSTECTOMY  1994   COLONOSCOPY  2008,02/22/2012   Dr. Sankar-2013   COLONOSCOPY WITH PROPOFOL N/A 04/04/2017   Procedure: COLONOSCOPY WITH PROPOFOL;  Surgeon: Jessica BrightlySankar, Seeplaputhur G, MD;  [facility];  Service: Endoscopy;  Laterality: N/A;   POSTERIOR CERVICAL FUSION/FORAMINOTOMY N/A 04/01/2020   Procedure: POSTERIOR CERVICAL FUSION/FORAMINOTOMY CERVICAL FOUR- CERVICAL SIX;  Surgeon: Tressie StalkerJenkins, Jeffrey, MD;  [facility];  Service: Neurosurgery;  Laterality: N/A;   REFRACTIVE SURGERY      There were no vitals filed for this visit.   Subjective Assessment - 09/28/21 0855     Subjective  Pt. reports that she had to take someof her Melexicam    Patient is accompanied by: Family member    Pertinent History Pt. is  an 81 y.o. female who was diagnosed with Bilateral carpal tunnel syndrome. Pt. had a carpal tunnel release 05/2021. PMHx: Anterior cervical fusion, cervical spondylosis with myelopathy, and radiculopathy, RAS, Dissection of Abdominal Aorta, and HTN associated with DM.    Patient Stated Goals To be able to pin her quilting project    Currently in Pain? Yes    Pain Score 6     Pain Location Knee    Pain Orientation Left;Right    Pain Descriptors / Indicators Aching    Pain Type Chronic pain            OT TREATMENT     Therapeutic Ex:   Pt. worked on instrinsic stretches/tendon glides for the right hand. Reviewed proper technique with cues for visual  demonstration, and cue for proper technique. Pt. Worked on pinch strengthening in the right hand for lateral, and 3pt. pinch using yellow, red, green, and blue resistive clips placing them onto a horizntal dowel. Tactile and verbal cues were required for eliciting the desired movement.      Manual Therapy:   Pt. tolerated scar massage at the right carpal tunnel, and soft tissue massage to the thenar eminence secondary to stiffness. Manual therapy was performed independent of, and in preparation for neuromuscular re-education.   Neuromuscular re-education:  Pt. worked on right [facility] skills using the Copy Task. Pt. worked on sustaining grasp on the resistive tweezers while grasping this sticks, and moving them from a horizontal position to a vertical position to prepare for placing them into the pegboard. Pt. Worked on placing then Jamar board at a vertical angle to encourage wrist extension. Pt. required verbal cues, and cues for visual demonstration for wrist position, and hand pattern when placing them into the pegboard.   Pt. reports having pain in her knees today. Pt. is improving with thumb opposition to each digit. Pt. Was able to sustain the 3pt. Grasp on the tweezers with the right hand while completing the Jamar task, however it became increasingly difficult to sustain the grasp on the tweezer while extending the wrist to place them onto the vertical board. Performance improved with making a slight decrease in the vertical angle.  Pt. continues to present with difficulty performing translatory movements with the right hand. Pt. continues to work on improving UE functioning in order to work towards improving, and maximizing independence with ADLs, and IADL tasks.                             OT Short Term Goals - 09/07/21 1151       OT SHORT TERM GOAL #1   Title Pt. will be independent with HEPs    Baseline Eval: No current HEP.    Time 6    Period  Weeks    Status New    Target Date 10/19/21      OT SHORT TERM GOAL #2   Title Pt. will improve FOTO score by 2 pts for Pt. perceived performance improvement with assessment specifc ADL/IADLs    Baseline Eval:  FOTO score: 53, TR score: 61    Time 6    Period Weeks    Status New    Target Date 10/19/21               OT Long Term Goals - 09/07/21 1154       OT LONG TERM GOAL #1   Title Pt. will increase Right  grip strength by 5# to improve hold on tools.    Baseline Eval: Grip strength: R: 18#, L: 35#    Time 12    Period Weeks    Status New    Target Date 11/30/21      OT LONG TERM GOAL #2   Title Pt. will improve right lateral pinch strength by 2# to be able to assist with sewing tasks.    Baseline Eval: R: 7#, L: 13#    Time 12    Period Weeks    Status New    Target Date 11/30/21      OT LONG TERM GOAL #3   Title Pt. will improve 3pt. pinch strength to be able to button independently, and efficiently    Baseline Eval: 3pt. pinch strength: R: 6#, L: 10#    Time 12    Period Weeks    Status New    Target Date 11/30/21      OT LONG TERM GOAL #4   Title Pt. will improve [facility] skills with the right hand in order to improve accuracy with managing quilting pins without dropping them.    Baseline Eval: R: 30 sec., L: 26 sec.    Time 12    Period Weeks    Status New    Target Date 11/30/21                   Plan - 09/28/21 1200     Clinical Impression Statement Pt. reports having pain in her knees today. Pt. is improving with thumb opposition to each digit. Pt. Was able to sustain the 3pt. Grasp on the tweezers with the right hand while completing the Jamar task, however it became increasingly difficult to sustain the grasp on the tweezer while extending the wrist to place them onto the vertical board. Performance improved with making a slight decrease in the vertical angle.  Pt. continues to present with difficulty performing translatory movements with the  right hand. Pt. continues to work on improving UE functioning in order to work towards improving, and maximizing independence with ADLs, and IADL tasks.      OT Occupational Profile and History Detailed Assessment- Review of Records and additional review of physical, cognitive, psychosocial history related to current functional performance    Occupational performance deficits (Please refer to evaluation for details): ADL's;IADL's    Body Structure / Function / Physical Skills ADL;Endurance;Strength;Dexterity;UE functional use;IADL;ROM;Pain    Rehab Potential Fair    Clinical Decision Making Several treatment options, min-mod task modification necessary    Comorbidities Affecting Occupational Performance: May have comorbidities impacting occupational performance    Modification or Assistance to Complete Evaluation  Min-Moderate modification of tasks or assist with assess necessary to complete eval    OT Frequency 2x / week    OT Duration 12 weeks    OT Treatment/Interventions Self-care/ADL training;Ultrasound;Neuromuscular education;Paraffin;Therapeutic exercise;DME and/or AE instruction;Patient/family education;Therapeutic activities;Energy conservation;Moist Heat;Contrast Bath;Passive range of motion;Scar mobilization    Consulted and Agree with Plan of Care Patient             Patient will benefit from skilled therapeutic intervention in order to improve the following deficits and impairments:   Body Structure / Function / Physical Skills: ADL, Endurance, Strength, Dexterity, UE functional use, IADL, ROM, Pain       Visit Diagnosis: Muscle weakness (generalized)  Other lack of coordination    Problem List Patient Active Problem List   Diagnosis Date Noted   Carpal tunnel  syndrome 07/13/2021   Numbness of hand 07/13/2021   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HCC) 07/13/2021   Spinal stenosis of lumbar region 07/13/2021   Cervical spondylosis with myelopathy  and radiculopathy 04/01/2020   Lumbar adjacent segment disease with spondylolisthesis 03/30/2020   Overactive bladder 01/17/2020   Myalgia due to statin 12/13/2019   Spinal stenosis in cervical region 11/19/2019   Cervical spine pain 10/15/2019   Low back pain 03/26/2019   History of lumbar laminectomy 03/26/2019   Scoliosis deformity of spine 03/26/2019   Spondylolisthesis, grade 1 03/26/2019   Lumbar radiculopathy 03/26/2019   Hyperlipidemia associated with type 2 diabetes mellitus (HCC) 03/15/2019   Fatigue 12/11/2017   Primary osteoarthritis of left knee 03/10/2016   Depression 12/28/2015   Knee pain 11/26/2015   Allergic rhinitis 01/31/2015   Benign paroxysmal positional nystagmus 01/31/2015   Bladder infection, chronic 01/31/2015   Glaucoma 01/31/2015   Chronic hyponatremia 01/31/2015   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 01/31/2015   Anxiety 11/04/2014   Fibrocystic breast disease 04/30/2013   Urinary tract infection 11/01/2012   Renal artery stenosis (HCC) 04/18/2012   Chest pain 10/10/2011   Murmur 10/10/2011   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HCC) 03/31/2011   Hypertension associated with diabetes (HCC) 08/03/2010   CAD (coronary artery disease) 08/03/2010   DM (diabetes mellitus) (HCC) 08/03/2010   Olegario Messier, MS, OTR/L   Olegario Messier, OT 09/28/2021, 12:01 PM  [facility] MAIN [facility] SERVICES [address] [place], [place], [address] Phone: [phone]   Fax:  [phone]  Name: Jessica Wheeler MRN: [id number] Date of Birth: [birth year]

## 2021-09-30 ENCOUNTER — Ambulatory Visit: Payer: Medicare HMO | Attending: Student | Admitting: Occupational Therapy

## 2021-09-30 ENCOUNTER — Encounter: Payer: Self-pay | Admitting: Occupational Therapy

## 2021-09-30 DIAGNOSIS — R278 Other lack of coordination: Secondary | ICD-10-CM | POA: Diagnosis not present

## 2021-09-30 DIAGNOSIS — M6281 Muscle weakness (generalized): Secondary | ICD-10-CM | POA: Diagnosis not present

## 2021-09-30 NOTE — Therapy (Signed)
[facility] [address] [place], [place], [address] Phone: [phone]   Fax:  [phone]  Occupational Therapy Treatment  Patient Details  Name: Jessica Wheeler MRN: [id number] Date of Birth: 01-12-1941 Referring Provider (OT): Val EagleMeghan Bergman, NP Surgeon Dr. Daiva EvesJenkins   Encounter Date: 09/30/2021   OT End of Session - 09/30/21 0937     Visit Number 7    Date for OT Re-Evaluation 11/30/21    Authorization Type Progress report period starting  09/07/2021    OT Start Time 0830    OT Stop Time 0915    OT Time Calculation (min) 45 min    Activity Tolerance Patient tolerated treatment well;Patient limited by pain    Behavior During Therapy [facility] for tasks assessed/performed             Past Medical History:  Diagnosis Date   Allergy    Arthritis    Bundle branch block, right    C7 cervical fracture (HCC)    Diabetes mellitus    Diffuse cystic mastopathy    Dissection, aorta (HCC)    Dr.Dew follows   Diverticulosis    Family history of malignant neoplasm of gastrointestinal tract    Glaucoma 2005   Heart murmur    History of bronchitis    History of pancreatitis 2005   Hyperlipidemia    Hypertension 1982   Personal history of tobacco use, presenting hazards to health    RBBB    Ulcer     Past Surgical History:  Procedure Laterality Date   ABDOMINAL HYSTERECTOMY  1991   ANTERIOR CERVICAL CORPECTOMY N/A 03/30/2020   Procedure: CORPECTOMY CERVICAL FOUR - CERVICAL FIVE;  Surgeon: Tressie StalkerJenkins, Jeffrey, MD;  [facility];  Service: Neurosurgery;  Laterality: N/A;  anterior   APPENDECTOMY     BACK SURGERY  1982   for lumbar ruptured disc. No hardware, no discectomy, no fusion   BLADDER SURGERY     BREAST BIOPSY Right 1980's   benign   BREAST EXCISIONAL BIOPSY Right 1980s   benign   BREAST SURGERY     biopsy   CARDIAC CATHETERIZATION     Dr. Lady GaryFath, few years ago   CATARACT EXTRACTION W/PHACO Left 08/31/2015    Procedure: CATARACT EXTRACTION PHACO AND INTRAOCULAR LENS PLACEMENT (IOC);  Surgeon: Sherald HessAnita Prakash Vin-Parikh, MD;  [facility];  Service: Ophthalmology;  Laterality: Left;  DIABETIC - oral meds   CATARACT EXTRACTION W/PHACO Right 10/12/2015   Procedure: CATARACT EXTRACTION PHACO AND INTRAOCULAR LENS PLACEMENT (IOC) right eye;  Surgeon: Sherald HessAnita Prakash Vin-Parikh, MD;  [facility];  Service: Ophthalmology;  Laterality: Right;  DIABETIC - oral meds   CHOLECYSTECTOMY  1994   COLONOSCOPY  2008,02/22/2012   Dr. Sankar-2013   COLONOSCOPY WITH PROPOFOL N/A 04/04/2017   Procedure: COLONOSCOPY WITH PROPOFOL;  Surgeon: Jessica BrightlySankar, Seeplaputhur G, MD;  [facility];  Service: Endoscopy;  Laterality: N/A;   POSTERIOR CERVICAL FUSION/FORAMINOTOMY N/A 04/01/2020   Procedure: POSTERIOR CERVICAL FUSION/FORAMINOTOMY CERVICAL FOUR- CERVICAL SIX;  Surgeon: Tressie StalkerJenkins, Jeffrey, MD;  [facility];  Service: Neurosurgery;  Laterality: N/A;   REFRACTIVE SURGERY      There were no vitals filed for this visit.   Subjective Assessment - 09/30/21 0936     Subjective  Pt. rpeorts working out in her yard.    Patient is accompanied by: Family member    Pertinent History Pt. is an 81 y.o. female who was diagnosed with Bilateral carpal  tunnel syndrome. Pt. had a carpal tunnel release 05/2021. PMHx: Anterior cervical fusion, cervical spondylosis with myelopathy, and radiculopathy, RAS, Dissection of Abdominal Aorta, and HTN associated with DM.    Currently in Pain? No/denies            OT TREATMENT    Neuro muscular re-education:  Pt. Worked on right hand [facility] skills focusing on grasping small objects and reaching up to place items in various positions of vertical angles to encourage wrist extension while performing [facility] skills. Pt. Worked with pushpins placing them into a corkboard. Pt. worked on placing the 1" sticks, 1/4" collars, and 1/4" washers on the P[employer]ue Pegboard, as well  as placing grooved pegs onto the grooved pegboard.   Paraffin Bath:   Pt. Tolerated Paraffin Bath for 10 min. To the right hand with clear wrap, and a towel wrap secondary to stiffness.    Manual Therapy:   Pt. tolerated scar massage at the right carpal tunnel, and soft tissue massage to the thenar eminence, as well as carpal, and metacarpal spread stretches. Manual therapy was performed independent of, and in preparation for neuromuscular re-education.   Pt. Education was provided about compensatory strategies with position changes when completing sewing applicaes. Pt. Was able to efficiently perform right hand [facility] tasks efficiently with wrist extended, however tends to fatigues with sustained elevation of the RUE during the task requiring rest breaks. Pt. Continues to work on improving  right hand pain, strength, and  [facility] skills in order to improve right hand functioning during sewing for ADLs, and IADL tasks as well as quilting projects.                         OT Education - 09/30/21 0937     Education Details HEP    Person(s) Educated Patient    Methods Explanation    Comprehension Verbalized understanding;Returned demonstration;Verbal cues required              OT Short Term Goals - 09/07/21 1151       OT SHORT TERM GOAL #1   Title Pt. will be independent with HEPs    Baseline Eval: No current HEP.    Time 6    Period Weeks    Status New    Target Date 10/19/21      OT SHORT TERM GOAL #2   Title Pt. will improve FOTO score by 2 pts for Pt. perceived performance improvement with assessment specifc ADL/IADLs    Baseline Eval:  FOTO score: 53, TR score: 61    Time 6    Period Weeks    Status New    Target Date 10/19/21               OT Long Term Goals - 09/07/21 1154       OT LONG TERM GOAL #1   Title Pt. will increase Right grip strength by 5# to improve hold on tools.    Baseline Eval: Grip strength: R: 18#, L: 35#    Time 12     Period Weeks    Status New    Target Date 11/30/21      OT LONG TERM GOAL #2   Title Pt. will improve right lateral pinch strength by 2# to be able to assist with sewing tasks.    Baseline Eval: R: 7#, L: 13#    Time 12    Period Weeks    Status New  Target Date 11/30/21      OT LONG TERM GOAL #3   Title Pt. will improve 3pt. pinch strength to be able to button independently, and efficiently    Baseline Eval: 3pt. pinch strength: R: 6#, L: 10#    Time 12    Period Weeks    Status New    Target Date 11/30/21      OT LONG TERM GOAL #4   Title Pt. will improve [facility] skills with the right hand in order to improve accuracy with managing quilting pins without dropping them.    Baseline Eval: R: 30 sec., L: 26 sec.    Time 12    Period Weeks    Status New    Target Date 11/30/21                   Plan - 09/30/21 0938     Clinical Impression Statement Pt. Education was provided about compensatory strategies with position changes when completing sewing applicaes. Pt. Was able to efficiently perform right hand [facility] tasks efficiently with wrist extended, however tends to fatigues with sustained elevation of the RUE during the task requiring rest breaks. Pt. Continues to work on improving  right hand pain, strength, and  [facility] skills in order to improve right hand functioning during sewing for ADLs, and IADL tasks as well as quilting projects.      OT Occupational Profile and History Detailed Assessment- Review of Records and additional review of physical, cognitive, psychosocial history related to current functional performance    Occupational performance deficits (Please refer to evaluation for details): ADL's;IADL's    Body Structure / Function / Physical Skills ADL;Endurance;Strength;Dexterity;UE functional use;IADL;ROM;Pain    Rehab Potential Fair    Clinical Decision Making Several treatment options, min-mod task modification necessary    Comorbidities Affecting Occupational  Performance: May have comorbidities impacting occupational performance    Modification or Assistance to Complete Evaluation  Min-Moderate modification of tasks or assist with assess necessary to complete eval    OT Frequency 2x / week    OT Duration 12 weeks    OT Treatment/Interventions Self-care/ADL training;Ultrasound;Neuromuscular education;Paraffin;Therapeutic exercise;DME and/or AE instruction;Patient/family education;Therapeutic activities;Energy conservation;Moist Heat;Contrast Bath;Passive range of motion;Scar mobilization    Consulted and Agree with Plan of Care Patient             Patient will benefit from skilled therapeutic intervention in order to improve the following deficits and impairments:   Body Structure / Function / Physical Skills: ADL, Endurance, Strength, Dexterity, UE functional use, IADL, ROM, Pain       Visit Diagnosis: Muscle weakness (generalized)  Other lack of coordination    Problem List Patient Active Problem List   Diagnosis Date Noted   Carpal tunnel syndrome 07/13/2021   Numbness of hand 07/13/2021   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HCC) 07/13/2021   Spinal stenosis of lumbar region 07/13/2021   Cervical spondylosis with myelopathy and radiculopathy 04/01/2020   Lumbar adjacent segment disease with spondylolisthesis 03/30/2020   Overactive bladder 01/17/2020   Myalgia due to statin 12/13/2019   Spinal stenosis in cervical region 11/19/2019   Cervical spine pain 10/15/2019   Low back pain 03/26/2019   History of lumbar laminectomy 03/26/2019   Scoliosis deformity of spine 03/26/2019   Spondylolisthesis, grade 1 03/26/2019   Lumbar radiculopathy 03/26/2019   Hyperlipidemia associated with type 2 diabetes mellitus (HCC) 03/15/2019   Fatigue 12/11/2017   Primary osteoarthritis of left knee 03/10/2016   Depression 12/28/2015  Knee pain 11/26/2015   Allergic rhinitis 01/31/2015   Benign paroxysmal positional  nystagmus 01/31/2015   Bladder infection, chronic 01/31/2015   Glaucoma 01/31/2015   Chronic hyponatremia 01/31/2015   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 01/31/2015   Anxiety 11/04/2014   Fibrocystic breast disease 04/30/2013   Urinary tract infection 11/01/2012   Renal artery stenosis (HCC) 04/18/2012   Chest pain 10/10/2011   Murmur 10/10/2011   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HCC) 03/31/2011   Hypertension associated with diabetes (HCC) 08/03/2010   CAD (coronary artery disease) 08/03/2010   DM (diabetes mellitus) (HCC) 08/03/2010   Olegario Messier, MS, OTR/L   Olegario Messier, OT 09/30/2021, 9:41 AM  [facility] MAIN [facility] SERVICES [address] [place], [place], [address] Phone: [phone]   Fax:  [phone]  Name: Jessica Wheeler MRN: [id number] Date of Birth: 18-Oct-1940

## 2021-10-01 ENCOUNTER — Other Ambulatory Visit: Payer: Self-pay | Admitting: Family Medicine

## 2021-10-01 DIAGNOSIS — E1169 Type 2 diabetes mellitus with other specified complication: Secondary | ICD-10-CM

## 2021-10-01 DIAGNOSIS — I10 Essential (primary) hypertension: Secondary | ICD-10-CM

## 2021-10-04 ENCOUNTER — Ambulatory Visit: Payer: Medicare HMO

## 2021-10-07 ENCOUNTER — Ambulatory Visit: Payer: Medicare HMO | Admitting: Occupational Therapy

## 2021-10-11 ENCOUNTER — Ambulatory Visit: Payer: Medicare HMO

## 2021-10-11 DIAGNOSIS — M6281 Muscle weakness (generalized): Secondary | ICD-10-CM

## 2021-10-11 DIAGNOSIS — R278 Other lack of coordination: Secondary | ICD-10-CM

## 2021-10-11 NOTE — Therapy (Signed)
[facility] MAIN [facility] SERVICES [address] [place], [place], [address] Phone: [phone]   Fax:  [phone]  Occupational Therapy Treatment  Patient Details  Name: Jessica Wheeler MRN: [id number] Date of Birth: 1940/08/23 Referring Provider (OT): Val Eagle, NP Surgeon Dr. Lovell Sheehan   Encounter Date: 10/11/2021   OT End of Session - 10/11/21 0920     Visit Number 8    Number of Visits 24    Date for OT Re-Evaluation 11/30/21    Authorization Type Progress report period starting  09/07/2021    OT Start Time 0830    OT Stop Time 0915    OT Time Calculation (min) 45 min    Activity Tolerance Patient tolerated treatment well    Behavior During Therapy [facility] for tasks assessed/performed             Past Medical History:  Diagnosis Date   Allergy    Arthritis    Bundle branch block, right    C7 cervical fracture (HCC)    Diabetes mellitus    Diffuse cystic mastopathy    Dissection, aorta (HCC)    Dr.Dew follows   Diverticulosis    Family history of malignant neoplasm of gastrointestinal tract    Glaucoma 2005   Heart murmur    History of bronchitis    History of pancreatitis 2005   Hyperlipidemia    Hypertension 1982   Personal history of tobacco use, presenting hazards to health    RBBB    Ulcer     Past Surgical History:  Procedure Laterality Date   ABDOMINAL HYSTERECTOMY  1991   ANTERIOR CERVICAL CORPECTOMY N/A 03/30/2020   Procedure: CORPECTOMY CERVICAL FOUR - CERVICAL FIVE;  Surgeon: Tressie Stalker, MD;  Location: [facility] OR;  Service: Neurosurgery;  Laterality: N/A;  anterior   APPENDECTOMY     BACK SURGERY  1982   for lumbar ruptured disc. No hardware, no discectomy, no fusion   BLADDER SURGERY     BREAST BIOPSY Right 1980's   benign   BREAST EXCISIONAL BIOPSY Right 1980s   benign   BREAST SURGERY     biopsy   CARDIAC CATHETERIZATION     Dr. Lady Gary, few years ago   CATARACT EXTRACTION W/PHACO Left 08/31/2015    Procedure: CATARACT EXTRACTION PHACO AND INTRAOCULAR LENS PLACEMENT (IOC);  Surgeon: Sherald Hess, MD;  [facility];  Service: Ophthalmology;  Laterality: Left;  DIABETIC - oral meds   CATARACT EXTRACTION W/PHACO Right 10/12/2015   Procedure: CATARACT EXTRACTION PHACO AND INTRAOCULAR LENS PLACEMENT (IOC) right eye;  Surgeon: Sherald Hess, MD;  [facility];  Service: Ophthalmology;  Laterality: Right;  DIABETIC - oral meds   CHOLECYSTECTOMY  1994   COLONOSCOPY  2008,02/22/2012   Dr. Sankar-2013   COLONOSCOPY WITH PROPOFOL N/A 04/04/2017   Procedure: COLONOSCOPY WITH PROPOFOL;  Surgeon: [name], MD;  [facility];  Service: Endoscopy;  Laterality: N/A;   POSTERIOR CERVICAL FUSION/FORAMINOTOMY N/A 04/01/2020   Procedure: POSTERIOR CERVICAL FUSION/FORAMINOTOMY CERVICAL FOUR- CERVICAL SIX;  Surgeon: Tressie Stalker, MD;  Location: [facility] OR;  Service: Neurosurgery;  Laterality: N/A;   REFRACTIVE SURGERY      There were no vitals filed for this visit.   Subjective Assessment - 10/11/21 0919     Subjective  Pt reports she'd like some stretches she can work on at home.    Patient is accompanied by: Family member    Pertinent History Pt. is an  81 y.o. female who was diagnosed with Bilateral carpal tunnel syndrome. Pt. had a carpal tunnel release 05/2021. PMHx: Anterior cervical fusion, cervical spondylosis with myelopathy, and radiculopathy, RAS, Dissection of Abdominal Aorta, and HTN associated with DM.    Patient Stated Goals To be able to pin her quilting project    Currently in Pain? No/denies    Pain Score 0-No pain    Pain Onset In the past 7 days            Occupational Therapy Treatment: Therapeutic Exercise: Facilitated pinch strengthening with use of therapy resistant clothespins to target lateral and 3 point pinch for R hand.  Worked with red, green, blue, and black pins for 2 trials for each color and  pinch type.  Performed 3 rounds of holding pinch with black pin x10 sec each.  Completed 3 sets 10 reps of grip strengthening with hand gripper using green and red band.  Instructed pt in and completed median nerve gliding, long arm wrist flex/ext stretch, thumb extension stretch, and supine on mat for towel roll stretch for pecs and back.  Handout issued for new stretches with pt performing with min vc.  Encouraged daily completion at home, with all stretches to be performed in pain free, tolerable range, hold 20 secs each.  Pt verbalized understanding.   Response to Treatment: Pt demos increased strength in hand, noted with pt reporting she's been unable to manage the black, heavy resistant clothespins, and noted to be able to pinch with lateral and 3 point pinch on this color today for multiple reps, including a 10 sec hold x3 trials.  Pt reports that her hands and arms sometimes feel stuck, and that she was interested in learning more stretching.  OT instructed pt in long arm thumb, wrist, forearm stretching this date, along with median nerve gliding, and towel roll stretch supine on mat.  Pt tolerated all stretches well and pt was pleased to have additional stretching to work on at home.  HEP issued and pt demonstrated good performance with min vc for technique.  Pt will continue to benefit from skilled OT for strengthening and coordination training with focus on HEP progression in order to maximize functional use of R hand for daily tasks.      OT Education - 10/11/21 0919     Education Details HEP, median nerve stretches    Person(s) Educated Patient    Methods Explanation    Comprehension Verbalized understanding;Returned demonstration;Verbal cues required;Need further instruction              OT Short Term Goals - 09/07/21 1151       OT SHORT TERM GOAL #1   Title Pt. will be independent with HEPs    Baseline Eval: No current HEP.    Time 6    Period Weeks    Status New    Target  Date 10/19/21      OT SHORT TERM GOAL #2   Title Pt. will improve FOTO score by 2 pts for Pt. perceived performance improvement with assessment specifc ADL/IADLs    Baseline Eval:  FOTO score: 53, TR score: 61    Time 6    Period Weeks    Status New    Target Date 10/19/21               OT Long Term Goals - 09/07/21 1154       OT LONG TERM GOAL #1   Title Pt. will increase Right  grip strength by 5# to improve hold on tools.    Baseline Eval: Grip strength: R: 18#, L: 35#    Time 12    Period Weeks    Status New    Target Date 11/30/21      OT LONG TERM GOAL #2   Title Pt. will improve right lateral pinch strength by 2# to be able to assist with sewing tasks.    Baseline Eval: R: 7#, L: 13#    Time 12    Period Weeks    Status New    Target Date 11/30/21      OT LONG TERM GOAL #3   Title Pt. will improve 3pt. pinch strength to be able to button independently, and efficiently    Baseline Eval: 3pt. pinch strength: R: 6#, L: 10#    Time 12    Period Weeks    Status New    Target Date 11/30/21      OT LONG TERM GOAL #4   Title Pt. will improve [facility] skills with the right hand in order to improve accuracy with managing quilting pins without dropping them.    Baseline Eval: R: 30 sec., L: 26 sec.    Time 12    Period Weeks    Status New    Target Date 11/30/21             Plan - 10/11/21 0933     Clinical Impression Statement Pt demos increased strength in hand, noted with pt reporting she's been unable to manage the black, heavy resistant clothespins, and noted to be able to pinch with lateral and 3 point pinch on this color today for multiple reps, including a 10 sec hold x3 trials.  Pt reports that her hands and arms sometimes feel stuck, and that she was interested in learning more stretching.  OT instructed pt in long arm thumb, wrist, forearm stretching this date, along with median nerve gliding, and towel roll stretch supine on mat.  Pt tolerated all  stretches well and pt was pleased to have additional stretching to work on at home.  HEP issued and pt demonstrated good performance with min vc for technique.  Pt will continue to benefit from skilled OT for strengthening and coordination training with focus on HEP progression in order to maximize functional use of R hand for daily tasks.    OT Occupational Profile and History Detailed Assessment- Review of Records and additional review of physical, cognitive, psychosocial history related to current functional performance    Occupational performance deficits (Please refer to evaluation for details): ADL's;IADL's    Body Structure / Function / Physical Skills ADL;Endurance;Strength;Dexterity;UE functional use;IADL;ROM;Pain    Rehab Potential Fair    Clinical Decision Making Several treatment options, min-mod task modification necessary    Comorbidities Affecting Occupational Performance: May have comorbidities impacting occupational performance    Modification or Assistance to Complete Evaluation  Min-Moderate modification of tasks or assist with assess necessary to complete eval    OT Frequency 2x / week    OT Duration 12 weeks    OT Treatment/Interventions Self-care/ADL training;Ultrasound;Neuromuscular education;Paraffin;Therapeutic exercise;DME and/or AE instruction;Patient/family education;Therapeutic activities;Energy conservation;Moist Heat;Contrast Bath;Passive range of motion;Scar mobilization    Consulted and Agree with Plan of Care Patient             Patient will benefit from skilled therapeutic intervention in order to improve the following deficits and impairments:   Body Structure / Function / Physical Skills: ADL, Endurance, Strength, Dexterity,  UE functional use, IADL, ROM, Pain       Visit Diagnosis: Muscle weakness (generalized)  Other lack of coordination    Problem List Patient Active Problem List   Diagnosis Date Noted   Carpal tunnel syndrome 07/13/2021    Numbness of hand 07/13/2021   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HCC) 07/13/2021   Spinal stenosis of lumbar region 07/13/2021   Cervical spondylosis with myelopathy and radiculopathy 04/01/2020   Lumbar adjacent segment disease with spondylolisthesis 03/30/2020   Overactive bladder 01/17/2020   Myalgia due to statin 12/13/2019   Spinal stenosis in cervical region 11/19/2019   Cervical spine pain 10/15/2019   Low back pain 03/26/2019   History of lumbar laminectomy 03/26/2019   Scoliosis deformity of spine 03/26/2019   Spondylolisthesis, grade 1 03/26/2019   Lumbar radiculopathy 03/26/2019   Hyperlipidemia associated with type 2 diabetes mellitus (HCC) 03/15/2019   Fatigue 12/11/2017   Primary osteoarthritis of left knee 03/10/2016   Depression 12/28/2015   Knee pain 11/26/2015   Allergic rhinitis 01/31/2015   Benign paroxysmal positional nystagmus 01/31/2015   Bladder infection, chronic 01/31/2015   Glaucoma 01/31/2015   Chronic hyponatremia 01/31/2015   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 MAIN [facility] SERVICES [address] [place], [place], [address] Phone: [phone]   Fax:  [phone]  Name: Jessica Wheeler MRN: [id number] Date of Birth: [birth year]

## 2021-10-12 ENCOUNTER — Ambulatory Visit: Payer: Medicare HMO | Admitting: Occupational Therapy

## 2021-10-14 ENCOUNTER — Ambulatory Visit: Payer: Medicare HMO | Admitting: Occupational Therapy

## 2021-10-14 ENCOUNTER — Encounter: Payer: Self-pay | Admitting: Occupational Therapy

## 2021-10-14 DIAGNOSIS — M6281 Muscle weakness (generalized): Secondary | ICD-10-CM | POA: Diagnosis not present

## 2021-10-14 DIAGNOSIS — R278 Other lack of coordination: Secondary | ICD-10-CM | POA: Diagnosis not present

## 2021-10-14 NOTE — Therapy (Signed)
[facility] MAIN [facility] SERVICES [address] [place], [place], [address] Phone: [phone]   Fax:  [phone]  Occupational Therapy Treatment  Patient Details  Name: Jessica Wheeler MRN: [id number] Date of Birth: [birth year] Referring Provider (OT): Val Eagle, NP Surgeon Dr. Lovell Sheehan   Encounter Date: 10/14/2021   OT End of Session - 10/14/21 0826     Visit Number 9    Number of Visits 24    Date for OT Re-Evaluation 11/30/21    Authorization Type Progress report period starting  09/07/2021    OT Start Time 0830    OT Stop Time 0915    OT Time Calculation (min) 45 min    Activity Tolerance Patient tolerated treatment well    Behavior During Therapy [facility] for tasks assessed/performed             Past Medical History:  Diagnosis Date   Allergy    Arthritis    Bundle branch block, right    C7 cervical fracture (HCC)    Diabetes mellitus    Diffuse cystic mastopathy    Dissection, aorta (HCC)    Dr.Dew follows   Diverticulosis    Family history of malignant neoplasm of gastrointestinal tract    Glaucoma 2005   Heart murmur    History of bronchitis    History of pancreatitis 2005   Hyperlipidemia    Hypertension 1982   Personal history of tobacco use, presenting hazards to health    RBBB    Ulcer     Past Surgical History:  Procedure Laterality Date   ABDOMINAL HYSTERECTOMY  1991   ANTERIOR CERVICAL CORPECTOMY N/A 03/30/2020   Procedure: CORPECTOMY CERVICAL FOUR - CERVICAL FIVE;  Surgeon: Tressie Stalker, MD;  Location: [facility] OR;  Service: Neurosurgery;  Laterality: N/A;  anterior   APPENDECTOMY     BACK SURGERY  1982   for lumbar ruptured disc. No hardware, no discectomy, no fusion   BLADDER SURGERY     BREAST BIOPSY Right 1980's   benign   BREAST EXCISIONAL BIOPSY Right 1980s   benign   BREAST SURGERY     biopsy   CARDIAC CATHETERIZATION     Dr. Lady Gary, few years ago   CATARACT EXTRACTION W/PHACO Left 08/31/2015    Procedure: CATARACT EXTRACTION PHACO AND INTRAOCULAR LENS PLACEMENT (IOC);  Surgeon: Sherald Hess, MD;  Location: [facility] SURGERY CNTR;  Service: Ophthalmology;  Laterality: Left;  DIABETIC - oral meds   CATARACT EXTRACTION W/PHACO Right 10/12/2015   Procedure: CATARACT EXTRACTION PHACO AND INTRAOCULAR LENS PLACEMENT (IOC) right eye;  Surgeon: Sherald Hess, MD;  Location: [facility];  Service: Ophthalmology;  Laterality: Right;  DIABETIC - oral meds   CHOLECYSTECTOMY  1994   COLONOSCOPY  2008,02/22/2012   Dr. Sankar-2013   COLONOSCOPY WITH PROPOFOL N/A 04/04/2017   Procedure: COLONOSCOPY WITH PROPOFOL;  Surgeon: [name], MD;  Location: [facility] ENDOSCOPY;  Service: Endoscopy;  Laterality: N/A;   POSTERIOR CERVICAL FUSION/FORAMINOTOMY N/A 04/01/2020   Procedure: POSTERIOR CERVICAL FUSION/FORAMINOTOMY CERVICAL FOUR- CERVICAL SIX;  Surgeon: Tressie Stalker, MD;  Location: [facility] OR;  Service: Neurosurgery;  Laterality: N/A;   REFRACTIVE SURGERY      There were no vitals filed for this visit.   Subjective Assessment - 10/14/21 0825     Pertinent History Pt. is an 81 y.o. female who was diagnosed with Bilateral carpal tunnel syndrome. Pt. had a carpal tunnel release 05/2021. PMHx: Anterior cervical fusion, cervical spondylosis with myelopathy,  and radiculopathy, RAS, Dissection of Abdominal Aorta, and HTN associated with DM.    Patient Stated Goals To be able to pin her quilting project    Currently in Pain? No/denies    Pain Onset In the past 7 days               Therapeutic Exercise Pt completed seated cane exercises using 1.5# dowel: shoulder flexion, abduction, chest press, scapular protraction/retraction, internal/external rotation. Reviewed median nerve glides, requires MIN cues, handout provided. Standing and seated neck stretches.               OT Education - 10/14/21 0826     Education Details HEP, cane stretches    Person(s)  Educated Patient    Methods Explanation    Comprehension Verbalized understanding;Returned demonstration;Verbal cues required;Need further instruction              OT Short Term Goals - 09/07/21 1151       OT SHORT TERM GOAL #1   Title Pt. will be independent with HEPs    Baseline Eval: No current HEP.    Time 6    Period Weeks    Status New    Target Date 10/19/21      OT SHORT TERM GOAL #2   Title Pt. will improve FOTO score by 2 pts for Pt. perceived performance improvement with assessment specifc ADL/IADLs    Baseline Eval:  FOTO score: 53, TR score: 61    Time 6    Period Weeks    Status New    Target Date 10/19/21               OT Long Term Goals - 09/07/21 1154       OT LONG TERM GOAL #1   Title Pt. will increase Right grip strength by 5# to improve hold on tools.    Baseline Eval: Grip strength: R: 18#, L: 35#    Time 12    Period Weeks    Status New    Target Date 11/30/21      OT LONG TERM GOAL #2   Title Pt. will improve right lateral pinch strength by 2# to be able to assist with sewing tasks.    Baseline Eval: R: 7#, L: 13#    Time 12    Period Weeks    Status New    Target Date 11/30/21      OT LONG TERM GOAL #3   Title Pt. will improve 3pt. pinch strength to be able to button independently, and efficiently    Baseline Eval: 3pt. pinch strength: R: 6#, L: 10#    Time 12    Period Weeks    Status New    Target Date 11/30/21      OT LONG TERM GOAL #4   Title Pt. will improve [facility] skills with the right hand in order to improve accuracy with managing quilting pins without dropping them.    Baseline Eval: R: 30 sec., L: 26 sec.    Time 12    Period Weeks    Status New    Target Date 11/30/21                   Plan - 10/14/21 0826     Clinical Impression Statement Pt reports difficulty stretching her neck, reviewed median nerve glides with new handout. Tolerated seated cane exercises with 1/5# dowel. Pt tolerated all stretches  well and pt was pleased to have  additional stretching to work on at home.  HEP issued and pt demonstrated good performance with min vc for technique.  Pt will continue to benefit from skilled OT for strengthening and coordination training with focus on HEP progression in order to maximize functional use of R hand for daily tasks.    OT Occupational Profile and History Detailed Assessment- Review of Records and additional review of physical, cognitive, psychosocial history related to current functional performance    Occupational performance deficits (Please refer to evaluation for details): ADL's;IADL's    Body Structure / Function / Physical Skills ADL;Endurance;Strength;Dexterity;UE functional use;IADL;ROM;Pain    Rehab Potential Fair    Clinical Decision Making Several treatment options, min-mod task modification necessary    Comorbidities Affecting Occupational Performance: May have comorbidities impacting occupational performance    Modification or Assistance to Complete Evaluation  Min-Moderate modification of tasks or assist with assess necessary to complete eval    OT Frequency 2x / week    OT Duration 12 weeks    OT Treatment/Interventions Self-care/ADL training;Ultrasound;Neuromuscular education;Paraffin;Therapeutic exercise;DME and/or AE instruction;Patient/family education;Therapeutic activities;Energy conservation;Moist Heat;Contrast Bath;Passive range of motion;Scar mobilization    Consulted and Agree with Plan of Care Patient             Patient will benefit from skilled therapeutic intervention in order to improve the following deficits and impairments:   Body Structure / Function / Physical Skills: ADL, Endurance, Strength, Dexterity, UE functional use, IADL, ROM, Pain       Visit Diagnosis: Muscle weakness (generalized)  Other lack of coordination    Problem List Patient Active Problem List   Diagnosis Date Noted   Carpal tunnel syndrome 07/13/2021   Numbness of  hand 07/13/2021   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HCC) 07/13/2021   Spinal stenosis of lumbar region 07/13/2021   Cervical spondylosis with myelopathy and radiculopathy 04/01/2020   Lumbar adjacent segment disease with spondylolisthesis 03/30/2020   Overactive bladder 01/17/2020   Myalgia due to statin 12/13/2019   Spinal stenosis in cervical region 11/19/2019   Cervical spine pain 10/15/2019   Low back pain 03/26/2019   History of lumbar laminectomy 03/26/2019   Scoliosis deformity of spine 03/26/2019   Spondylolisthesis, grade 1 03/26/2019   Lumbar radiculopathy 03/26/2019   Hyperlipidemia associated with type 2 diabetes mellitus (HCC) 03/15/2019   Fatigue 12/11/2017   Primary osteoarthritis of left knee 03/10/2016   Depression 12/28/2015   Knee pain 11/26/2015   Allergic rhinitis 01/31/2015   Benign paroxysmal positional nystagmus 01/31/2015   Bladder infection, chronic 01/31/2015   Glaucoma 01/31/2015   Chronic hyponatremia 01/31/2015   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 OTR/L  10/14/21, 9:16 AM  ascom [phone]   [place] MAIN [facility] SERVICES [address] [place], [place], [address] Phone: [phone]   Fax:  [phone]  Name: MARILENE VATH MRN: [id number] Date of Birth: [birth year]

## 2021-10-18 ENCOUNTER — Ambulatory Visit: Payer: Medicare HMO | Admitting: Occupational Therapy

## 2021-10-18 DIAGNOSIS — R278 Other lack of coordination: Secondary | ICD-10-CM | POA: Diagnosis not present

## 2021-10-18 DIAGNOSIS — M6281 Muscle weakness (generalized): Secondary | ICD-10-CM | POA: Diagnosis not present

## 2021-10-18 NOTE — Therapy (Signed)
[facility] MAIN [facility] SERVICES [address] [place], [place], [address] Phone: [phone]   Fax:  [phone]  Occupational Therapy Discharge  Patient Details  Name: Jessica Wheeler MRN: [id number] Date of Birth: January 17, 1941 Referring Provider (OT): Val Eagle, NP Surgeon Dr. Lovell Sheehan   Encounter Date: 10/18/2021   OT End of Session - 10/18/21 0838     Visit Number 10    Number of Visits 24    Date for OT Re-Evaluation 11/30/21    Authorization Type Progress report period starting  09/07/2021    OT Start Time 0830    OT Stop Time 0915    OT Time Calculation (min) 45 min    Activity Tolerance Patient tolerated treatment well    Behavior During Therapy [facility] for tasks assessed/performed             Past Medical History:  Diagnosis Date   Allergy    Arthritis    Bundle branch block, right    C7 cervical fracture (HCC)    Diabetes mellitus    Diffuse cystic mastopathy    Dissection, aorta (HCC)    Dr.Dew follows   Diverticulosis    Family history of malignant neoplasm of gastrointestinal tract    Glaucoma 2005   Heart murmur    History of bronchitis    History of pancreatitis 2005   Hyperlipidemia    Hypertension 1982   Personal history of tobacco use, presenting hazards to health    RBBB    Ulcer     Past Surgical History:  Procedure Laterality Date   ABDOMINAL HYSTERECTOMY  1991   ANTERIOR CERVICAL CORPECTOMY N/A 03/30/2020   Procedure: CORPECTOMY CERVICAL FOUR - CERVICAL FIVE;  Surgeon: Tressie Stalker, MD;  Location: [facility] OR;  Service: Neurosurgery;  Laterality: N/A;  anterior   APPENDECTOMY     BACK SURGERY  1982   for lumbar ruptured disc. No hardware, no discectomy, no fusion   BLADDER SURGERY     BREAST BIOPSY Right 1980's   benign   BREAST EXCISIONAL BIOPSY Right 1980s   benign   BREAST SURGERY     biopsy   CARDIAC CATHETERIZATION     Dr. Lady Gary, few years ago   CATARACT EXTRACTION W/PHACO Left 08/31/2015    Procedure: CATARACT EXTRACTION PHACO AND INTRAOCULAR LENS PLACEMENT (IOC);  Surgeon: Sherald Hess, MD;  Location: [facility];  Service: Ophthalmology;  Laterality: Left;  DIABETIC - oral meds   CATARACT EXTRACTION W/PHACO Right 10/12/2015   Procedure: CATARACT EXTRACTION PHACO AND INTRAOCULAR LENS PLACEMENT (IOC) right eye;  Surgeon: Sherald Hess, MD;  Location: [facility] SUR[facility]ERY [facility];  Service: Ophthalmology;  Laterality: Right;  DIABETIC - oral meds   CHOLECYSTECTOMY  1994   COLONOSCOPY  2008,02/22/2012   Dr. Sankar-2013   COLONOSCOPY WITH PROPOFOL N/A 04/04/2017   Procedure: COLONOSCOPY WITH PROPOFOL;  Surgeon: [name], MD;  Location: [facility] ENDOSCOPY;  Service: Endoscopy;  Laterality: N/A;   POSTERIOR CERVICAL FUSION/FORAMINOTOMY N/A 04/01/2020   Procedure: POSTERIOR CERVICAL FUSION/FORAMINOTOMY CERVICAL FOUR- CERVICAL SIX;  Surgeon: Tressie Stalker, MD;  Location: [facility] OR;  Service: Neurosurgery;  Laterality: N/A;   REFRACTIVE SURGERY      There were no vitals filed for this visit.   Subjective Assessment - 10/18/21 0834     Subjective  Pt reports pain from behind the head cane exercises, reports she is ready for d/c.    Pertinent History Pt. is an 81 y.o. female who was diagnosed  with Bilateral carpal tunnel syndrome. Pt. had a carpal tunnel release 05/2021. PMHx: Anterior cervical fusion, cervical spondylosis with myelopathy, and radiculopathy, RAS, Dissection of Abdominal Aorta, and HTN associated with DM.    Patient Stated Goals To be able to pin her quilting project    Currently in Pain? Yes    Pain Score 2     Pain Location Neck    Pain Orientation Upper    Pain Descriptors / Indicators Aching    Pain Type Acute pain    Pain Onset In the past 7 days                [facility] OT Assessment - 10/18/21 0001       Observation/Other Assessments   Focus on Therapeutic Outcomes (FOTO)  59      Coordination   Right 9 Hole Peg Test 24     Left 9 Hole Peg Test 22      Hand Function   Right Hand Grip (lbs) 42    Right Hand Lateral Pinch 13 lbs    Right Hand 3 Point Pinch 14 lbs    Left Hand Grip (lbs) 30    Left Hand Lateral Pinch 14 lbs    Left 3 point pinch 15 lbs               Therapeutic Exercise Goals and measurements were obtained this session. Pt notably improved grip, pinch, and dexterity achieving all goals. Pt reports feeling ready to discharge, will continue home regiment (reports completing HEP 2x/day). Return demonstrated stretches, exercises, and nerve glides with no cues. Given blue theraputty for HEP.               OT Education - 10/18/21 0837     Education Details HEP    Person(s) Educated Patient    Methods Explanation    Comprehension Verbalized understanding;Returned demonstration;Verbal cues required;Need further instruction              OT Short Term Goals - 10/18/21 0845       OT SHORT TERM GOAL #1   Title Pt. will be independent with HEPs    Baseline Eval: No current HEP. 10/18/21: completes HEP (stretches, exercises, putty, clips) 2x/day    Time 6    Period Weeks    Status Achieved    Target Date 10/19/21      OT SHORT TERM GOAL #2   Title Pt. will improve FOTO score by 2 pts for Pt. perceived performance improvement with assessment specifc ADL/IADLs    Baseline Eval:  FOTO score: 53, TR score: 61. 10/18/21: FOTO 59    Time 6    Period Weeks    Status Achieved    Target Date 10/19/21               OT Long Term Goals - 10/18/21 0846       OT LONG TERM GOAL #1   Title Pt. will increase Right grip strength by 5# to improve hold on tools.    Baseline Eval: Grip strength: R: 18#, L: 35#. 10/18/21: Grip R: 42#    Time 12    Period Weeks    Status Achieved    Target Date 11/30/21      OT LONG TERM GOAL #2   Title Pt. will improve right lateral pinch strength by 2# to be able to assist with sewing tasks.    Baseline Eval: R: 7#, L: 13#. 10/18/21: R lateral  pinch 13#  Time 12    Period Weeks    Status Achieved    Target Date 11/30/21      OT LONG TERM GOAL #3   Title Pt. will improve 3pt. pinch strength to be able to button independently, and efficiently    Baseline Eval: 3pt. pinch strength: R: 6#, L: 10#. 10/18/21: 3pt pinch R: 14    Time 12    Period Weeks    Status Achieved    Target Date 11/30/21      OT LONG TERM GOAL #4   Title Pt. will improve [facility] skills with the right hand in order to improve accuracy with managing quilting pins without dropping them.    Baseline Eval: R: 30 sec., L: 26 sec. 10/18/21: R: 24 sec, L 22 sec    Time 12    Period Weeks    Status Achieved    Target Date 11/30/21                   Plan - 10/18/21 3762     Clinical Impression Statement Pt reports ready for discharge. Return demonstrates HEP for stretches, exercises, and nerve glides. Reports completing 2x/day, given blue theraputty to upgrade. All goals achieved - R grip increased from 18# to 42#, R FMF improved PEG score from 30" to 24". WIll discharge pt at thie time.    OT Occupational Profile and History Detailed Assessment- Review of Records and additional review of physical, cognitive, psychosocial history related to current functional performance    Occupational performance deficits (Please refer to evaluation for details): ADL's;IADL's    Body Structure / Function / Physical Skills ADL;Endurance;Strength;Dexterity;UE functional use;IADL;ROM;Pain    Rehab Potential Fair    Clinical Decision Making Several treatment options, min-mod task modification necessary    Comorbidities Affecting Occupational Performance: May have comorbidities impacting occupational performance    Modification or Assistance to Complete Evaluation  Min-Moderate modification of tasks or assist with assess necessary to complete eval    OT Frequency 2x / week    OT Duration 12 weeks    OT Treatment/Interventions Self-care/ADL training;Ultrasound;Neuromuscular  education;Paraffin;Therapeutic exercise;DME and/or AE instruction;Patient/family education;Therapeutic activities;Energy conservation;Moist Heat;Contrast Bath;Passive range of motion;Scar mobilization    Consulted and Agree with Plan of Care Patient             Patient will benefit from skilled therapeutic intervention in order to improve the following deficits and impairments:   Body Structure / Function / Physical Skills: ADL, Endurance, Strength, Dexterity, UE functional use, IADL, ROM, Pain       Visit Diagnosis: Muscle weakness (generalized)  Other lack of coordination    Problem List Patient Active Problem List   Diagnosis Date Noted   Carpal tunnel syndrome 07/13/2021   Numbness of hand 07/13/2021   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HCC) 07/13/2021   Spinal stenosis of lumbar region 07/13/2021   Cervical spondylosis with myelopathy and radiculopathy 04/01/2020   Lumbar adjacent segment disease with spondylolisthesis 03/30/2020   Overactive bladder 01/17/2020   Myalgia due to statin 12/13/2019   Spinal stenosis in cervical region 11/19/2019   Cervical spine pain 10/15/2019   Low back pain 03/26/2019   History of lumbar laminectomy 03/26/2019   Scoliosis deformity of spine 03/26/2019   Spondylolisthesis, grade 1 03/26/2019   Lumbar radiculopathy 03/26/2019   Hyperlipidemia associated with type 2 diabetes mellitus (HCC) 03/15/2019   Fatigue 12/11/2017   Primary osteoarthritis of left knee 03/10/2016   Depression 12/28/2015   Knee pain 11/26/2015  Allergic rhinitis 01/31/2015   Benign paroxysmal positional nystagmus 01/31/2015   Bladder infection, chronic 01/31/2015   Glaucoma 01/31/2015   Chronic hyponatremia 01/31/2015   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 OTR/L  10/18/21, 9:04 AM  ascom [phone]  [place] [facility] MAIN [facility] SERVICES [address] [place], [place], [address] Phone: [phone]   Fax:  [phone]  Name: LYNNEL ZANETTI MRN: [id number] Date of Birth: 02-Mar-1941

## 2021-10-21 ENCOUNTER — Encounter: Payer: Medicare HMO | Admitting: Occupational Therapy

## 2021-10-22 ENCOUNTER — Encounter: Payer: Self-pay | Admitting: Family Medicine

## 2021-10-22 ENCOUNTER — Ambulatory Visit (INDEPENDENT_AMBULATORY_CARE_PROVIDER_SITE_OTHER): Payer: Medicare HMO | Admitting: Family Medicine

## 2021-10-22 VITALS — BP 136/68 | HR 79 | Temp 98.6°F | Resp 16 | Ht 63.0 in | Wt 142.8 lb

## 2021-10-22 DIAGNOSIS — E1159 Type 2 diabetes mellitus with other circulatory complications: Secondary | ICD-10-CM

## 2021-10-22 DIAGNOSIS — I152 Hypertension secondary to endocrine disorders: Secondary | ICD-10-CM

## 2021-10-22 DIAGNOSIS — E785 Hyperlipidemia, unspecified: Secondary | ICD-10-CM | POA: Diagnosis not present

## 2021-10-22 DIAGNOSIS — R5381 Other malaise: Secondary | ICD-10-CM | POA: Diagnosis not present

## 2021-10-22 DIAGNOSIS — E1169 Type 2 diabetes mellitus with other specified complication: Secondary | ICD-10-CM

## 2021-10-22 DIAGNOSIS — R5383 Other fatigue: Secondary | ICD-10-CM

## 2021-10-22 DIAGNOSIS — E119 Type 2 diabetes mellitus without complications: Secondary | ICD-10-CM | POA: Insufficient documentation

## 2021-10-25 ENCOUNTER — Encounter: Payer: Medicare HMO | Admitting: Occupational Therapy

## 2021-10-25 ENCOUNTER — Telehealth: Payer: Self-pay

## 2021-10-28 ENCOUNTER — Encounter: Payer: Medicare HMO | Admitting: Occupational Therapy

## 2021-10-29 LAB — COMPREHENSIVE METABOLIC PANEL
ALT: 28 IU/L (ref 0–32)
AST: 28 IU/L (ref 0–40)
Albumin/Globulin Ratio: 1.8 (ref 1.2–2.2)
Albumin: 4.4 g/dL (ref 3.6–4.6)
Alkaline Phosphatase: 51 IU/L (ref 44–121)
BUN/Creatinine Ratio: 25 (ref 12–28)
BUN: 21 mg/dL (ref 8–27)
Bilirubin Total: 0.2 mg/dL (ref 0.0–1.2)
CO2: 22 mmol/L (ref 20–29)
Calcium: 9.5 mg/dL (ref 8.7–10.3)
Chloride: 93 mmol/L — ABNORMAL LOW (ref 96–106)
Creatinine, Ser: 0.85 mg/dL (ref 0.57–1.00)
Globulin, Total: 2.5 g/dL (ref 1.5–4.5)
Glucose: 146 mg/dL — ABNORMAL HIGH (ref 70–99)
Potassium: 3.7 mmol/L (ref 3.5–5.2)
Sodium: 132 mmol/L — ABNORMAL LOW (ref 134–144)
Total Protein: 6.9 g/dL (ref 6.0–8.5)
eGFR: 68 mL/min/{1.73_m2} (ref >59)

## 2021-10-29 LAB — RHEUMATOID FACTOR: Rheumatoid fact SerPl-aCnc: <10 IU/mL (ref <14.0)

## 2021-10-29 LAB — CYCLIC CITRUL PEPTIDE ANTIBODY, IGG/IGA: Cyclic Citrullin Peptide Ab: 4 units (ref 0–19)

## 2021-10-29 LAB — CBC WITH DIFFERENTIAL/PLATELET
Basophils Absolute: 0.1 10*3/uL (ref 0.0–0.2)
Basos: 1 %
EOS (ABSOLUTE): 0.2 10*3/uL (ref 0.0–0.4)
Eos: 3 %
Hematocrit: 35.9 % (ref 34.0–46.6)
Hemoglobin: 12.6 g/dL (ref 11.1–15.9)
Immature Grans (Abs): 0 10*3/uL (ref 0.0–0.1)
Immature Granulocytes: 0 %
Lymphocytes Absolute: 2.6 10*3/uL (ref 0.7–3.1)
Lymphs: 41 %
MCH: 31.3 pg (ref 26.6–33.0)
MCHC: 35.1 g/dL (ref 31.5–35.7)
MCV: 89 fL (ref 79–97)
Monocytes Absolute: 0.6 10*3/uL (ref 0.1–0.9)
Monocytes: 9 %
Neutrophils Absolute: 3 10*3/uL (ref 1.4–7.0)
Neutrophils: 46 %
Platelets: 246 10*3/uL (ref 150–450)
RBC: 4.02 x10E6/uL (ref 3.77–5.28)
RDW: 13 % (ref 11.7–15.4)
WBC: 6.4 10*3/uL (ref 3.4–10.8)

## 2021-10-29 LAB — SEDIMENTATION RATE: Sed Rate: 15 mm/hr (ref 0–40)

## 2021-10-29 LAB — TSH+FREE T4
Free T4: 1.13 ng/dL (ref 0.82–1.77)
TSH: 4.33 u[IU]/mL (ref 0.450–4.500)

## 2021-10-29 LAB — C-REACTIVE PROTEIN: CRP: 2 mg/L (ref 0–10)

## 2021-10-29 LAB — LYME DISEASE DNA BY PCR(BORRELIA BURG): Lyme (B. burgdorferi) PCR: NEGATIVE

## 2021-10-29 LAB — LYME DISEASE SEROLOGY W/REFLEX: Lyme Total Antibody EIA: NEGATIVE

## 2021-10-29 LAB — HEMOGLOBIN A1C
Est. average glucose Bld gHb Est-mCnc: 154 mg/dL
Hgb A1c MFr Bld: 7 % — ABNORMAL HIGH (ref 4.8–5.6)

## 2021-11-01 ENCOUNTER — Encounter: Payer: Medicare HMO | Admitting: Occupational Therapy

## 2021-11-01 ENCOUNTER — Telehealth: Payer: Self-pay

## 2021-11-01 NOTE — Telephone Encounter (Signed)
Patient advised.

## 2021-11-01 NOTE — Telephone Encounter (Signed)
-----   Message from Jacky Kindle, FNP sent at 10/29/2021  4:58 PM EDT ----- Negative lyme. ----- Message ----- From: Nell Range Lab Results In Sent: 10/23/2021   7:38 AM EDT To: Jacky Kindle, FNP

## 2021-11-04 ENCOUNTER — Encounter: Payer: Medicare HMO | Admitting: Occupational Therapy

## 2021-11-05 DIAGNOSIS — H43813 Vitreous degeneration, bilateral: Secondary | ICD-10-CM | POA: Diagnosis not present

## 2021-11-05 DIAGNOSIS — Z7984 Long term (current) use of oral hypoglycemic drugs: Secondary | ICD-10-CM | POA: Diagnosis not present

## 2021-11-05 DIAGNOSIS — E119 Type 2 diabetes mellitus without complications: Secondary | ICD-10-CM | POA: Diagnosis not present

## 2021-11-05 DIAGNOSIS — Z961 Presence of intraocular lens: Secondary | ICD-10-CM | POA: Diagnosis not present

## 2021-11-05 DIAGNOSIS — H353132 Nonexudative age-related macular degeneration, bilateral, intermediate dry stage: Secondary | ICD-10-CM | POA: Diagnosis not present

## 2021-11-05 DIAGNOSIS — H401131 Primary open-angle glaucoma, bilateral, mild stage: Secondary | ICD-10-CM | POA: Diagnosis not present

## 2021-11-08 ENCOUNTER — Encounter: Payer: Medicare HMO | Admitting: Occupational Therapy

## 2021-11-11 ENCOUNTER — Encounter: Payer: Medicare HMO | Admitting: Occupational Therapy

## 2021-11-15 ENCOUNTER — Encounter: Payer: Medicare HMO | Admitting: Occupational Therapy

## 2021-11-18 ENCOUNTER — Encounter: Payer: Medicare HMO | Admitting: Occupational Therapy

## 2021-11-18 DIAGNOSIS — R300 Dysuria: Secondary | ICD-10-CM | POA: Diagnosis not present

## 2021-11-22 ENCOUNTER — Encounter: Payer: Medicare HMO | Admitting: Occupational Therapy

## 2021-11-25 ENCOUNTER — Encounter: Payer: Medicare HMO | Admitting: Occupational Therapy

## 2021-11-25 DIAGNOSIS — L821 Other seborrheic keratosis: Secondary | ICD-10-CM | POA: Diagnosis not present

## 2021-11-29 ENCOUNTER — Encounter: Payer: Medicare HMO | Admitting: Occupational Therapy

## 2021-12-02 ENCOUNTER — Encounter: Payer: Medicare HMO | Admitting: Occupational Therapy

## 2021-12-06 ENCOUNTER — Encounter: Payer: Medicare HMO | Admitting: Occupational Therapy

## 2021-12-09 ENCOUNTER — Ambulatory Visit: Payer: Self-pay

## 2021-12-09 ENCOUNTER — Encounter: Payer: Medicare HMO | Admitting: Occupational Therapy

## 2021-12-09 NOTE — Chronic Care Management (AMB) (Signed)
   12/09/2021  Jessica Wheeler 12-22-1940 [id number]   Documentation encounter created to complete case transition. The care management team will continue to follow for care coordination.   [facility] Care Management [phone]

## 2021-12-10 DIAGNOSIS — M4712 Other spondylosis with myelopathy, cervical region: Secondary | ICD-10-CM | POA: Diagnosis not present

## 2021-12-13 ENCOUNTER — Encounter: Payer: Medicare HMO | Admitting: Occupational Therapy

## 2021-12-16 ENCOUNTER — Encounter: Payer: Medicare HMO | Admitting: Occupational Therapy

## 2021-12-18 ENCOUNTER — Other Ambulatory Visit: Payer: Self-pay | Admitting: Family Medicine

## 2021-12-20 ENCOUNTER — Encounter: Payer: Medicare HMO | Admitting: Occupational Therapy

## 2021-12-23 ENCOUNTER — Ambulatory Visit: Payer: Self-pay | Admitting: *Deleted

## 2021-12-23 ENCOUNTER — Encounter: Payer: Medicare HMO | Admitting: Occupational Therapy

## 2021-12-23 ENCOUNTER — Other Ambulatory Visit: Payer: Self-pay | Admitting: Family Medicine

## 2021-12-23 MED ORDER — NIRMATRELVIR/RITONAVIR (PAXLOVID)TABLET: 3.0000 | ORAL_TABLET | Freq: Two times a day (BID) | ORAL | 0 refills | Status: AC

## 2021-12-23 NOTE — Telephone Encounter (Signed)
Message read to pt.,  States she has already picked up medication. Advise reviewed, verbalizes understanding.

## 2021-12-23 NOTE — Telephone Encounter (Signed)
Called patient to give provider note. LMTCB. Okay for PEC to advise.  

## 2021-12-23 NOTE — Telephone Encounter (Addendum)
Per agent: "The patient has experienced an elevated temperature of 100.6   The patient shares that they have previously taken both tylenol and advil   The patient first began to experience symptoms yesterday 12/22/20   The patient has had no stomach discomfort but an elevated temperature and body aches   Please contact the patient further when possible "    Pt placed in Call Back. Is going to Covid test  CAlled back at 1120, pt tested positive.  Chief Complaint: Covid positive, tested today Symptoms: Cough, fever 100.2-100.6 with tylenol on board, body aches,headache, itchy throat,Mild SOB with exertion Frequency: Symptoms onset yesterday Pertinent Negatives: Patient denies wheezing,SOB at rest,GI symptoms Disposition: [] ED /[] Urgent Care (no appt availability in office) / [] Appointment(In office/virtual)/ []  [place] [facility]/ [] Home Care/ [] Refused Recommended Disposition /[] [place] [facility]/ [x]  Follow-up with PCP Additional Notes: Pt requesting oral anti-viral. No availability OV or virtual until tomorrow. Pt is high risk per protocol. Pt requesting med be called in. Advised may need tele visit but would request med. States if appropriate RD. Care advise provided, self isolation guidelines reviewed. Pt verbalizes understanding. Please advise: [phone]  Answer Assessment - Initial Assessment Questions 1. ONSET: "When did the cough begin?"      Yesterday 2. SEVERITY: "How bad is the cough today?"      Awake at night, bad spells during day 3. SPUTUM: "Describe the color of your sputum" (none, dry cough; clear, white, yellow, green)     "Hacking cough" but not coming up 4. HEMOPTYSIS: "Are you coughing up any blood?" If so ask: "How much?" (flecks, streaks, tablespoons, etc.)     no 5. DIFFICULTY BREATHING: "Are you having difficulty breathing?" If Yes, ask: "How bad is it?" (e.g., mild, moderate, severe)    - MILD: No SOB at rest, mild  SOB with walking, speaks normally in sentences, can lie down, no retractions, pulse < 100.    - MODERATE: SOB at rest, SOB with minimal exertion and prefers to sit, cannot lie down flat, speaks in phrases, mild retractions, audible wheezing, pulse 100-120.    - SEVERE: Very SOB at rest, speaks in single words, struggling to breathe, sitting hunched forward, retractions, pulse > 120      *No Answer* 6. FEVER: "Do you have a fever?" If Yes, ask: "What is your temperature, how was it measured, and when did it start?"     100.2-100.6 with tylenol 7. CARDIAC HISTORY: "Do you have any history of heart disease?" (e.g., heart attack, congestive heart failure)      *No Answer* 8. LUNG HISTORY: "Do you have any history of lung disease?"  (e.g., pulmonary embolus, asthma, emphysema)     *No Answer* 9. PE RISK FACTORS: "Do you have a history of blood clots?" (or: recent major surgery, recent prolonged travel, bedridden)     *No Answer* 10. OTHER SYMPTOMS: "Do you have any other symptoms?" (e.g., runny nose, wheezing, chest pain)       Body aches, congestion  Protocols used: Cough - Acute Productive-A-AH

## 2021-12-23 NOTE — Telephone Encounter (Signed)
Reason for Disposition . [1] HIGH RISK patient (e.g., weak immune system, age > 64 years, obesity with BMI 30 or higher, pregnant, chronic lung disease or other chronic medical condition) AND [2] COVID symptoms (e.g., cough, fever)  (Exceptions: Already seen by PCP and no new or worsening symptoms.)  Answer Assessment - Initial Assessment Questions 1. COVID-19 DIAGNOSIS: "How do you know that you have COVID?" (e.g., positive lab test or self-test, diagnosed by doctor or NP/PA, symptoms after exposure).     Home test 2. COVID-19 EXPOSURE: "Was there any known exposure to COVID before the symptoms began?" CDC Definition of close contact: within 6 feet (2 meters) for a total of 15 minutes or more over a 24-hour period.      Family have similar symptoms, have not tested 3. ONSET: "When did the COVID-19 symptoms start?"      Yesterday 4. WORST SYMPTOM: "What is your worst symptom?" (e.g., cough, fever, shortness of breath, muscle aches)     Cough,congestion 5. COUGH: "Do you have a cough?" If Yes, ask: "How bad is the cough?"       Yes, mostly dry 6. FEVER: "Do you have a fever?" If Yes, ask: "What is your temperature, how was it measured, and when did it start?"     Yes 100.2-100.6 7. RESPIRATORY STATUS: "Describe your breathing?" (e.g., normal; shortness of breath, wheezing, unable to speak)      SOB with exertion 8. BETTER-SAME-WORSE: "Are you getting better, staying the same or getting worse compared to yesterday?"  If getting worse, ask, "In what way?"     worse 9. OTHER SYMPTOMS: "Do you have any other symptoms?"  (e.g., chills, fatigue, headache, loss of smell or taste, muscle pain, sore throat)     Body aches,headache 10. HIGH RISK DISEASE: "Do you have any chronic medical problems?" (e.g., asthma, heart or lung disease, weak immune system, obesity, etc.)       yes 11. VACCINE: "Have you had the COVID-19 vaccine?" If Yes, ask: "Which one, how many shots, when did you get it?"       First  3, no boosters last fall 13. O2 SATURATION MONITOR:  "Do you use an oxygen saturation monitor (pulse oximeter) at home?" If Yes, ask "What is your reading (oxygen level) today?" "What is your usual oxygen saturation reading?" (e.g., 95%)  Protocols used: Coronavirus (COVID-19) Diagnosed or Suspected-A-AH

## 2021-12-24 ENCOUNTER — Other Ambulatory Visit: Payer: Self-pay | Admitting: Family Medicine

## 2021-12-24 DIAGNOSIS — I10 Essential (primary) hypertension: Secondary | ICD-10-CM

## 2021-12-27 ENCOUNTER — Encounter: Payer: Medicare HMO | Admitting: Occupational Therapy

## 2021-12-30 ENCOUNTER — Encounter: Payer: Medicare HMO | Admitting: Occupational Therapy

## 2022-01-05 ENCOUNTER — Ambulatory Visit: Payer: Self-pay

## 2022-01-06 ENCOUNTER — Encounter: Payer: Self-pay | Admitting: Family Medicine

## 2022-01-06 ENCOUNTER — Ambulatory Visit (INDEPENDENT_AMBULATORY_CARE_PROVIDER_SITE_OTHER): Payer: Medicare HMO | Admitting: Family Medicine

## 2022-01-06 DIAGNOSIS — B9689 Other specified bacterial agents as the cause of diseases classified elsewhere: Secondary | ICD-10-CM | POA: Insufficient documentation

## 2022-01-06 DIAGNOSIS — J019 Acute sinusitis, unspecified: Secondary | ICD-10-CM

## 2022-01-06 MED ORDER — AMOXICILLIN-POT CLAVULANATE 875-125 MG PO TABS: 1.0000 | ORAL_TABLET | Freq: Two times a day (BID) | ORAL | 0 refills | Status: AC

## 2022-01-06 NOTE — Progress Notes (Signed)
Virtual telephone visit   I,Jessica Wheeler,acting as a scribe for [employer], MD.,have documented all relevant documentation on the behalf of Jessica Foster, MD,as directed by  Jessica Foster, MD while in the presence of Jessica Foster, MD.   Virtual Visit via Telephone Note   This visit type was conducted due to national recommendations for restrictions regarding the COVID-19 Pandemic (e.g. social distancing) in an effort to limit this patient's exposure and mitigate transmission in our community. Due to her co-morbid illnesses, this patient is at least at moderate risk for complications without adequate follow up. This format is felt to be most appropriate for this patient at this time. The patient did not have access to video technology or had technical difficulties with video requiring transitioning to audio format only (telephone). Physical exam was limited to content and character of the telephone converstion.    Patient location: patient's home address  Provider location: [facility]  I discussed the limitations of evaluation and management by telemedicine and the availability of in person appointments. The patient expressed understanding and agreed to proceed.   Visit Date: 01/06/2022  Today's healthcare provider: Eulis Foster, MD   Chief Complaint  Patient presents with   URI   Subjective    HPI  Patient reports that she tested positive for covid 12/23/2021 and Paxlovid was prescribed. She reports that she finished the course of medicine and was feeling better.  Reports that for the past three days, she has developed chest congestion and runny nose and is gradually worsening. No fever.She is currently taking Coricidin cough and cold with no relief. Reports that 12/31/21,she felt back to normal and was able to walk. Three days ago, she began to have rhinorrhea. She then developed cough and chest tightness.     Medications: Outpatient Medications Prior to Visit  Medication Sig   Accu-Chek FastClix Lancets MISC TEST FASTING BLOOD SUGAR EVERY DAY   amLODipine (NORVASC) 5 MG tablet TAKE 1 TABLET EVERY DAY   aspirin 81 MG EC tablet Take 81 mg by mouth daily.   Blood Glucose Monitoring Suppl (ACCU-CHEK GUIDE) w/Device KIT USE AS DIRECTED TO CHECK FASTING BLOOD SUGAR DAILY   glucose blood (ACCU-CHEK GUIDE) test strip TEST FASTING BLOOD SUGAR EVERY DAY AS DIRECTED   hydrochlorothiazide (HYDRODIURIL) 25 MG tablet TAKE 1 TABLET EVERY DAY   ibuprofen (ADVIL) 200 MG tablet Take 200 mg by mouth daily at 6 (six) AM.   Lancets Misc. (ACCU-CHEK FASTCLIX LANCET) KIT 1 each by Does not apply route as directed.   latanoprost (XALATAN) 0.005 % ophthalmic solution Place 1 drop into both eyes at bedtime.    losartan (COZAAR) 100 MG tablet TAKE 1 TABLET EVERY DAY   Magnesium 250 MG TABS Take 250 mg by mouth daily.   metFORMIN (GLUCOPHAGE) 500 MG tablet TAKE 2 TABLETS TWICE DAILY WITH MEALS   neomycin-polymyxin-hydrocortisone (CORTISPORIN) OTIC solution    vitamin B-12 (CYANOCOBALAMIN) 1000 MCG tablet Take 1,000 mcg by mouth daily.   No facility-administered medications prior to visit.    Review of Systems  Constitutional:  Negative for chills and fever.  HENT:  Positive for congestion, rhinorrhea, sinus pressure, sinus pain and sneezing. Negative for ear pain and sore throat.        Ears have full sensation   Eyes:  Positive for discharge. Negative for pain and redness.  Respiratory:  Positive for cough and chest tightness. Negative for shortness of breath and wheezing.   Neurological:  Positive for headaches.  Objective    There were no vitals taken for this visit. Physical Exam Pulmonary:     Effort: No respiratory distress.     Comments: Patient speaking in full sentences, no signs of respiratory distress via telephone conversation  Neurological:     Mental Status: She is oriented to person,  place, and time.       Assessment & Plan     Problem List Items Addressed This Visit       Respiratory   Acute bacterial sinusitis - Primary    Acute Patient recovered from COVID 2 weeks ago and appears to have reoccurring symptoms including cough, congestion, sinus pressure/pain, rhinorrhea head and nasal congestion We will treat for acute bacterial sinusitis with Augmentin for 7-day course Counseled patient to eat regular meals with medication and drink plenty of fluids to prevent dehydration and movement the antibody causes loose stools, patient voiced understanding Patient to notify office if symptoms do not improve while on antibiotic therapy       Relevant Medications   amoxicillin-clavulanate (AUGMENTIN) 875-125 MG tablet     Return if symptoms worsen or fail to improve.    I discussed the assessment and treatment plan with the patient. The patient was provided an opportunity to ask questions and all were answered. The patient agreed with the plan and demonstrated an understanding of the instructions.   The patient was advised to call back or seek an in-person evaluation if the symptoms worsen or if the condition fails to improve as anticipated.  I provided 11 minutes of non-face-to-face time during this encounter.  The entirety of the information documented in the History of Present Illness, Review of Systems and Physical Exam were personally obtained by me, Jessica Wheeler. Portions of this information were initially documented by the CMA and reviewed by me for thoroughness and accuracy.    Jessica Foster, MD Jessica Wheeler [phone] (phone) [phone] (fax)  Jessica Wheeler

## 2022-01-06 NOTE — Assessment & Plan Note (Signed)
Acute Patient recovered from COVID 2 weeks ago and appears to have reoccurring symptoms including cough, congestion, sinus pressure/pain, rhinorrhea head and nasal congestion We will treat for acute bacterial sinusitis with Augmentin for 7-day course Counseled patient to eat regular meals with medication and drink plenty of fluids to prevent dehydration and movement the antibody causes loose stools, patient voiced understanding Patient to notify office if symptoms do not improve while on antibiotic therapy

## 2022-01-19 NOTE — Progress Notes (Unsigned)
Established patient visit   Patient: Jessica Wheeler   DOB: Dec 18, 1940   81 y.o. Female  MRN: [id number] Visit Date: 01/20/2022  Today's healthcare provider: Gwyneth Sprout, FNP  Re Introduced to nurse practitioner role and practice setting.  All questions answered.  Discussed provider/patient relationship and expectations.   I,Bonetta Mostek J Taqwa Deem,acting as a scribe for Gwyneth Sprout, FNP.,have documented all relevant documentation on the behalf of Gwyneth Sprout, FNP,as directed by  Gwyneth Sprout, FNP while in the presence of Gwyneth Sprout, FNP.   Chief Complaint  Patient presents with   Diabetes   Hypertension   Hyperlipidemia   Subjective    HPI  Diabetes Mellitus Type II, follow-up  Lab Results  Component Value Date   HGBA1C 7.1 (A) 01/20/2022   HGBA1C 7.0 (H) 10/22/2021   HGBA1C 6.8 (H) 07/20/2021   Last seen for diabetes 3 months ago.  Management since then includes continuing the same treatment. She reports excellent compliance with treatment. She is not having side effects.   Home blood sugar records: fasting range: around 150  Episodes of hypoglycemia? No    Current insulin regiment: not used Most Recent Eye Exam: July 2023  --------------------------------------------------------------------------------------------------- Hypertension, follow-up  BP Readings from Last 3 Encounters:  01/20/22 (!) 150/56  10/22/21 136/68  09/06/21 (!) 138/58   Wt Readings from Last 3 Encounters:  01/20/22 138 lb (62.6 kg)  10/22/21 142 lb 12.8 oz (64.8 kg)  09/06/21 141 lb 6 oz (64.1 kg)     She was last seen for hypertension 3 months ago.  BP at that visit was 136/68. Management since that visit includes continue medications. She reports excellent compliance with treatment. She is not having side effects.  She is exercising. She is adherent to low salt diet.   Outside blood pressures are not checked.  She does not smoke.  Use of agents associated with  hypertension: none.   --------------------------------------------------------------------------------------------------- Lipid/Cholesterol, follow-up  Last Lipid Panel: Lab Results  Component Value Date   CHOL 187 02/03/2021   LDLCALC 90 02/03/2021   HDL 84 02/03/2021   TRIG 70 02/03/2021    She was last seen for this 3 months ago.  Management since that visit includes continue medications.  She reports excellent compliance with treatment. She is not having side effects.   Symptoms: No appetite changes No foot ulcerations  No chest pain No chest pressure/discomfort  No dyspnea No orthopnea  No fatigue No lower extremity edema  No palpitations No paroxysmal nocturnal dyspnea  No nausea No numbness or tingling of extremity  No polydipsia No polyuria  No speech difficulty No syncope   She is following a Regular diet. Current exercise: walking  Last metabolic panel Lab Results  Component Value Date   GLUCOSE 146 (H) 10/22/2021   NA 132 (L) 10/22/2021   K 3.7 10/22/2021   BUN 21 10/22/2021   CREATININE 0.85 10/22/2021   EGFR 68 10/22/2021   GFRNONAA >60 03/31/2020   CALCIUM 9.5 10/22/2021   AST 28 10/22/2021   ALT 28 10/22/2021   The ASCVD Risk score (Arnett DK, et al., 2019) failed to calculate for the following reasons:   The 2019 ASCVD risk score is only valid for ages 13 to 36  ---------------------------------------------------------------------------------------------------   Medications: Outpatient Medications Prior to Visit  Medication Sig   Accu-Chek FastClix Lancets MISC TEST FASTING BLOOD SUGAR EVERY DAY   amLODipine (NORVASC) 5 MG tablet TAKE 1 TABLET EVERY  DAY   aspirin 81 MG EC tablet Take 81 mg by mouth daily.   Blood Glucose Monitoring Suppl (ACCU-CHEK GUIDE) w/Device KIT USE AS DIRECTED TO CHECK FASTING BLOOD SUGAR DAILY   cyclobenzaprine (FLEXERIL) 5 MG tablet Take ONE tab PO BID/TID PRN   glucose blood (ACCU-CHEK GUIDE) test strip TEST  FASTING BLOOD SUGAR EVERY DAY AS DIRECTED   ibuprofen (ADVIL) 200 MG tablet Take 200 mg by mouth daily at 6 (six) AM.   Lancets Misc. (ACCU-CHEK FASTCLIX LANCET) KIT 1 each by Does not apply route as directed.   latanoprost (XALATAN) 0.005 % ophthalmic solution Place 1 drop into both eyes at bedtime.    Magnesium 250 MG TABS Take 250 mg by mouth daily.   metFORMIN (GLUCOPHAGE) 500 MG tablet TAKE 2 TABLETS TWICE DAILY WITH MEALS   neomycin-polymyxin-hydrocortisone (CORTISPORIN) OTIC solution    vitamin B-12 (CYANOCOBALAMIN) 1000 MCG tablet Take 1,000 mcg by mouth daily.   [DISCONTINUED] amiloride-hydrochlorothiazide (MODURETIC) 5-50 MG tablet TAKE ONE HALF TABLET BY MOUTH ONCE DAILY AS NEEDED FOR SWELLING   [DISCONTINUED] amLODipine (NORVASC) 5 MG tablet Take 1 tablet by mouth daily.   [DISCONTINUED] hydrochlorothiazide (HYDRODIURIL) 25 MG tablet TAKE 1 TABLET EVERY DAY   [DISCONTINUED] losartan (COZAAR) 100 MG tablet TAKE 1 TABLET EVERY DAY   [DISCONTINUED] losartan (COZAAR) 100 MG tablet Take 1 tablet by mouth daily.   No facility-administered medications prior to visit.    Review of Systems  Last CBC Lab Results  Component Value Date   WBC 6.4 10/22/2021   HGB 12.6 10/22/2021   HCT 35.9 10/22/2021   MCV 89 10/22/2021   MCH 31.3 10/22/2021   RDW 13.0 10/22/2021   PLT 246 38/18/2993   Last metabolic panel Lab Results  Component Value Date   GLUCOSE 146 (H) 10/22/2021   NA 132 (L) 10/22/2021   K 3.7 10/22/2021   CL 93 (L) 10/22/2021   CO2 22 10/22/2021   BUN 21 10/22/2021   CREATININE 0.85 10/22/2021   EGFR 68 10/22/2021   CALCIUM 9.5 10/22/2021   PROT 6.9 10/22/2021   ALBUMIN 4.4 10/22/2021   LABGLOB 2.5 10/22/2021   AGRATIO 1.8 10/22/2021   BILITOT 0.2 10/22/2021   ALKPHOS 51 10/22/2021   AST 28 10/22/2021   ALT 28 10/22/2021   ANIONGAP 13 03/31/2020   Last lipids Lab Results  Component Value Date   CHOL 187 02/03/2021   HDL 84 02/03/2021   LDLCALC 90  02/03/2021   TRIG 70 02/03/2021   CHOLHDL 2.2 02/03/2021   Last hemoglobin A1c Lab Results  Component Value Date   HGBA1C 7.1 (A) 01/20/2022   Last thyroid functions Lab Results  Component Value Date   TSH 4.330 10/22/2021   Last vitamin D Lab Results  Component Value Date   VD25OH 29.2 (L) 12/11/2017   Last vitamin B12 and Folate Lab Results  Component Value Date   VITAMINB12 1,190 12/11/2017       Objective    BP (!) 150/56 (BP Location: Right Arm, Patient Position: Sitting, Cuff Size: Normal)   Pulse 77   Resp 16   Ht '5\' 3"'  (1.6 m)   Wt 138 lb (62.6 kg)   SpO2 98%   BMI 24.45 kg/m  BP Readings from Last 3 Encounters:  01/20/22 (!) 150/56  10/22/21 136/68  09/06/21 (!) 138/58   Wt Readings from Last 3 Encounters:  01/20/22 138 lb (62.6 kg)  10/22/21 142 lb 12.8 oz (64.8 kg)  09/06/21 141 lb 6  oz (64.1 kg)   SpO2 Readings from Last 3 Encounters:  01/20/22 98%  10/22/21 98%  09/06/21 98%      Physical Exam Vitals and nursing note reviewed.  Constitutional:      General: She is not in acute distress.    Appearance: Normal appearance. She is normal weight. She is not ill-appearing, toxic-appearing or diaphoretic.  HENT:     Head: Normocephalic and atraumatic.  Cardiovascular:     Rate and Rhythm: Normal rate and regular rhythm.     Pulses: Normal pulses.     Heart sounds: Normal heart sounds. No murmur heard.    No friction rub. No gallop.  Pulmonary:     Effort: Pulmonary effort is normal. No respiratory distress.     Breath sounds: Normal breath sounds. No stridor. No wheezing, rhonchi or rales.  Chest:     Chest wall: No tenderness.  Abdominal:     General: Bowel sounds are normal.     Palpations: Abdomen is soft.  Musculoskeletal:        General: No swelling, tenderness, deformity or signs of injury. Normal range of motion.     Right lower leg: No edema.     Left lower leg: No edema.  Skin:    General: Skin is warm and dry.     Capillary  Refill: Capillary refill takes less than 2 seconds.     Coloration: Skin is not jaundiced or pale.     Findings: No bruising, erythema, lesion or rash.  Neurological:     General: No focal deficit present.     Mental Status: She is alert and oriented to person, place, and time. Mental status is at baseline.     Cranial Nerves: No cranial nerve deficit.     Sensory: No sensory deficit.     Motor: No weakness.     Coordination: Coordination normal.  Psychiatric:        Mood and Affect: Mood normal.        Behavior: Behavior normal.        Thought Content: Thought content normal.        Judgment: Judgment normal.       Results for orders placed or performed in visit on 01/20/22  POCT glycosylated hemoglobin (Hb A1C)  Result Value Ref Range   Hemoglobin A1C 7.1 (A) 4.0 - 5.6 %   Est. average glucose Bld gHb Est-mCnc 157     Assessment & Plan     Problem List Items Addressed This Visit       Cardiovascular and Mediastinum   Hypertension associated with diabetes ([place])    Chronic, elevated Home numbers reflect Patient declines additional agents/change in medication today given concern for previous hyponatremia and reduced DBP Encouraged use of home monitoring and home logs/machines brought with patient to next appt Currently on Norvasc 5 mg and HCTZ 25 mg in addition to Losartan 100 mg      Relevant Medications   losartan (COZAAR) 100 MG tablet   Other Relevant Orders   POCT glycosylated hemoglobin (Hb A1C) (Completed)   Comprehensive metabolic panel   CBC with Differential/Platelet     Endocrine   Diabetes mellitus ([place]) - Primary    Chronic, stable Reassuring given recent illness Continue to recommend balanced, lower carb meals. Smaller meal size, adding snacks. Choosing water as drink of choice and increasing purposeful exercise.       Relevant Medications   losartan (COZAAR) 100 MG tablet   Other Relevant  Orders   POCT glycosylated hemoglobin (Hb A1C)  (Completed)   Comprehensive metabolic panel   CBC with Differential/Platelet   Hyperlipidemia associated with type 2 diabetes mellitus (H[facility])    Chronic, elevated with last LDL of 90 Declines use of statins or zetia s/s complications recommend diet low in saturated fat and regular exercise - 30 min at least 5 times per week Repeat LP      Relevant Medications   losartan (COZAAR) 100 MG tablet     Other   Hyponatremia    Chronic, stable previously Encouraged stop of HCTZ to assist Continue to monitor LE edema with use of TED hose Repeat CMP      Relevant Orders   Comprehensive metabolic panel   CBC with Differential/Platelet   Need for influenza vaccination    Consented; VIS made available; no immediate side effects following administration; plan to repeat annually        Relevant Orders   Flu Vaccine QUAD High Dose(Fluad) (Completed)     Return in about 6 months (around 07/21/2022) for annual examination.      Vonna Kotyk, FNP, have reviewed all documentation for this visit. The documentation on 01/20/22 for the exam, diagnosis, procedures, and orders are all accurate and complete.    Gwyneth Sprout, [place] [phone] (phone) [phone] (fax)  [place]

## 2022-01-20 ENCOUNTER — Ambulatory Visit (INDEPENDENT_AMBULATORY_CARE_PROVIDER_SITE_OTHER): Payer: Medicare HMO | Admitting: Family Medicine

## 2022-01-20 ENCOUNTER — Encounter: Payer: Self-pay | Admitting: Family Medicine

## 2022-01-20 VITALS — BP 150/56 | HR 77 | Resp 16 | Ht 63.0 in | Wt 138.0 lb

## 2022-01-20 DIAGNOSIS — I152 Hypertension secondary to endocrine disorders: Secondary | ICD-10-CM

## 2022-01-20 DIAGNOSIS — Z23 Encounter for immunization: Secondary | ICD-10-CM | POA: Diagnosis not present

## 2022-01-20 DIAGNOSIS — E871 Hypo-osmolality and hyponatremia: Secondary | ICD-10-CM | POA: Insufficient documentation

## 2022-01-20 DIAGNOSIS — E1159 Type 2 diabetes mellitus with other circulatory complications: Secondary | ICD-10-CM | POA: Diagnosis not present

## 2022-01-20 DIAGNOSIS — E1169 Type 2 diabetes mellitus with other specified complication: Secondary | ICD-10-CM | POA: Diagnosis not present

## 2022-01-20 DIAGNOSIS — E785 Hyperlipidemia, unspecified: Secondary | ICD-10-CM

## 2022-01-20 DIAGNOSIS — I10 Essential (primary) hypertension: Secondary | ICD-10-CM | POA: Insufficient documentation

## 2022-01-20 LAB — POCT GLYCOSYLATED HEMOGLOBIN (HGB A1C)
Est. average glucose Bld gHb Est-mCnc: 157
Hemoglobin A1C: 7.1 % — AB (ref 4.0–5.6)

## 2022-01-20 MED ORDER — LOSARTAN POTASSIUM 100 MG PO TABS: 100.0000 mg | ORAL_TABLET | Freq: Every day | ORAL | 1 refills | Status: DC

## 2022-01-20 NOTE — Assessment & Plan Note (Signed)
Consented; VIS made available; no immediate side effects following administration; plan to repeat annually   

## 2022-01-20 NOTE — Assessment & Plan Note (Signed)
Chronic, elevated Home numbers reflect Patient declines additional agents/change in medication today given concern for previous hyponatremia and reduced DBP Encouraged use of home monitoring and home logs/machines brought with patient to next appt Currently on Norvasc 5 mg and HCTZ 25 mg in addition to Losartan 100 mg

## 2022-01-20 NOTE — Assessment & Plan Note (Signed)
Chronic, elevated with last LDL of 90 Declines use of statins or zetia s/s complications recommend diet low in saturated fat and regular exercise - 30 min at least 5 times per week Repeat LP

## 2022-01-20 NOTE — Assessment & Plan Note (Signed)
Chronic, stable Reassuring given recent illness Continue to recommend balanced, lower carb meals. Smaller meal size, adding snacks. Choosing water as drink of choice and increasing purposeful exercise.

## 2022-01-20 NOTE — Assessment & Plan Note (Signed)
Chronic, stable previously Encouraged stop of HCTZ to assist Continue to monitor LE edema with use of TED hose Repeat CMP

## 2022-01-21 LAB — COMPREHENSIVE METABOLIC PANEL
ALT: 26 IU/L (ref 0–32)
AST: 25 IU/L (ref 0–40)
Albumin/Globulin Ratio: 1.9 (ref 1.2–2.2)
Albumin: 4.6 g/dL (ref 3.7–4.7)
Alkaline Phosphatase: 53 IU/L (ref 44–121)
BUN/Creatinine Ratio: 16 (ref 12–28)
BUN: 13 mg/dL (ref 8–27)
Bilirubin Total: 0.5 mg/dL (ref 0.0–1.2)
CO2: 21 mmol/L (ref 20–29)
Calcium: 9.4 mg/dL (ref 8.7–10.3)
Chloride: 96 mmol/L (ref 96–106)
Creatinine, Ser: 0.79 mg/dL (ref 0.57–1.00)
Globulin, Total: 2.4 g/dL (ref 1.5–4.5)
Glucose: 140 mg/dL — ABNORMAL HIGH (ref 70–99)
Potassium: 3.9 mmol/L (ref 3.5–5.2)
Sodium: 135 mmol/L (ref 134–144)
Total Protein: 7 g/dL (ref 6.0–8.5)
eGFR: 75 mL/min/{1.73_m2} (ref >59)

## 2022-01-21 LAB — CBC WITH DIFFERENTIAL/PLATELET
Basophils Absolute: 0.1 10*3/uL (ref 0.0–0.2)
Basos: 1 %
EOS (ABSOLUTE): 0.1 10*3/uL (ref 0.0–0.4)
Eos: 3 %
Hematocrit: 39.7 % (ref 34.0–46.6)
Hemoglobin: 13.1 g/dL (ref 11.1–15.9)
Immature Grans (Abs): 0 10*3/uL (ref 0.0–0.1)
Immature Granulocytes: 0 %
Lymphocytes Absolute: 1.9 10*3/uL (ref 0.7–3.1)
Lymphs: 36 %
MCH: 30.4 pg (ref 26.6–33.0)
MCHC: 33 g/dL (ref 31.5–35.7)
MCV: 92 fL (ref 79–97)
Monocytes Absolute: 0.5 10*3/uL (ref 0.1–0.9)
Monocytes: 9 %
Neutrophils Absolute: 2.7 10*3/uL (ref 1.4–7.0)
Neutrophils: 51 %
Platelets: 253 10*3/uL (ref 150–450)
RBC: 4.31 x10E6/uL (ref 3.77–5.28)
RDW: 13 % (ref 11.7–15.4)
WBC: 5.4 10*3/uL (ref 3.4–10.8)

## 2022-01-21 NOTE — Progress Notes (Signed)
Blood chemistry shows improved sodium and chloride; stable elevation in blood glucose.  CBC also remains stable.  Gwyneth Sprout, [address] [place], [place] [address] [phone] (phone) [phone] (fax) [place]

## 2022-03-02 DIAGNOSIS — E119 Type 2 diabetes mellitus without complications: Secondary | ICD-10-CM | POA: Diagnosis not present

## 2022-03-02 DIAGNOSIS — H353132 Nonexudative age-related macular degeneration, bilateral, intermediate dry stage: Secondary | ICD-10-CM | POA: Diagnosis not present

## 2022-03-02 DIAGNOSIS — Z7984 Long term (current) use of oral hypoglycemic drugs: Secondary | ICD-10-CM | POA: Diagnosis not present

## 2022-03-02 DIAGNOSIS — H43813 Vitreous degeneration, bilateral: Secondary | ICD-10-CM | POA: Diagnosis not present

## 2022-03-02 DIAGNOSIS — H401131 Primary open-angle glaucoma, bilateral, mild stage: Secondary | ICD-10-CM | POA: Diagnosis not present

## 2022-03-02 DIAGNOSIS — Z961 Presence of intraocular lens: Secondary | ICD-10-CM | POA: Diagnosis not present

## 2022-03-02 LAB — HM DIABETES EYE EXAM

## 2022-03-04 ENCOUNTER — Telehealth: Payer: Self-pay

## 2022-03-07 NOTE — Progress Notes (Unsigned)
I,Deiona Hooper S Rhoderick Farrel,acting as a Education administrator for Lavon Paganini, MD.,have documented all relevant documentation on the behalf of Lavon Paganini, MD,as directed by  Lavon Paganini, MD while in the presence of Lavon Paganini, MD.     Established patient visit   Patient: Jessica Wheeler   DOB: 11-26-1940   81 y.o. Female  MRN: [id number] Visit Date: 03/08/2022  Today's healthcare provider: Lavon Paganini, MD   Chief Complaint  Patient presents with   Abdominal Pain   Subjective    Abdominal Pain This is a recurrent problem. The current episode started more than 1 month ago. The onset quality is gradual. The problem occurs intermittently. The problem has been waxing and waning. Associated symptoms include diarrhea. Pertinent negatives include no dysuria, hematuria, nausea or vomiting. Nothing aggravates the pain.  patient reports diarrhea started after taking Paxlovid in 11/2021. She reports diarrhea is on and off. She reports diarrhea worse in the last few days. Patient reports "slime" coming out from rectal after a bowel movement and with passing gas. She reports she has stopped some OTC medications to see if that would help decrease or stop diarrhea. She reports no improvement after stopping medications.   Has had 3 rounds of abx in the last few months prior to diarrhea Has prolapsed rectum - was told not to take immodium to avoid constipation Explosive diarrhea multiple times daily  Diabetic eye exam requested from Dr. Matilde Sprang.  Medications: Outpatient Medications Prior to Visit  Medication Sig   Accu-Chek FastClix Lancets MISC TEST FASTING BLOOD SUGAR EVERY DAY   amLODipine (NORVASC) 5 MG tablet TAKE 1 TABLET EVERY DAY   aspirin 81 MG EC tablet Take 81 mg by mouth daily.   Blood Glucose Monitoring Suppl (ACCU-CHEK GUIDE) w/Device KIT USE AS DIRECTED TO CHECK FASTING BLOOD SUGAR DAILY   cyclobenzaprine (FLEXERIL) 5 MG tablet Take ONE tab PO BID/TID PRN   glucose blood  (ACCU-CHEK GUIDE) test strip TEST FASTING BLOOD SUGAR EVERY DAY AS DIRECTED   hydrochlorothiazide (HYDRODIURIL) 25 MG tablet Take 25 mg by mouth daily.   ibuprofen (ADVIL) 200 MG tablet Take 200 mg by mouth daily at 6 (six) AM.   Lancets Misc. (ACCU-CHEK FASTCLIX LANCET) KIT 1 each by Does not apply route as directed.   latanoprost (XALATAN) 0.005 % ophthalmic solution Place 1 drop into both eyes at bedtime.    losartan (COZAAR) 100 MG tablet Take 1 tablet (100 mg total) by mouth daily.   Magnesium 250 MG TABS Take 250 mg by mouth daily.   metFORMIN (GLUCOPHAGE) 500 MG tablet TAKE 2 TABLETS TWICE DAILY WITH MEALS   vitamin B-12 (CYANOCOBALAMIN) 1000 MCG tablet Take 1,000 mcg by mouth daily.   [DISCONTINUED] neomycin-polymyxin-hydrocortisone (CORTISPORIN) OTIC solution    No facility-administered medications prior to visit.    Review of Systems  Constitutional:  Positive for fatigue. Negative for appetite change.  Respiratory:  Negative for cough and shortness of breath.   Cardiovascular:  Negative for chest pain.  Gastrointestinal:  Positive for abdominal distention, abdominal pain, diarrhea and rectal pain. Negative for blood in stool, nausea and vomiting.  Genitourinary:  Negative for dysuria and hematuria.       Objective    BP 130/69 (BP Location: Left Arm, Patient Position: Sitting, Cuff Size: Normal)   Pulse 73   Temp 98 F (36.7 C) (Oral)   Resp 16   Wt 137 lb 4.8 oz (62.3 kg)   BMI 24.32 kg/m  BP Readings from Last 3 Encounters:  03/08/22 130/69  01/20/22 (!) 150/56  10/22/21 136/68   Wt Readings from Last 3 Encounters:  03/08/22 137 lb 4.8 oz (62.3 kg)  01/20/22 138 lb (62.6 kg)  10/22/21 142 lb 12.8 oz (64.8 kg)      Physical Exam Vitals reviewed.  Constitutional:      General: She is not in acute distress.    Appearance: Normal appearance. She is well-developed. She is not diaphoretic.  HENT:     Head: Normocephalic and atraumatic.  Eyes:     General:  No scleral icterus.    Conjunctiva/sclera: Conjunctivae normal.  Neck:     Thyroid: No thyromegaly.  Cardiovascular:     Rate and Rhythm: Normal rate and regular rhythm.     Pulses: Normal pulses.     Heart sounds: Normal heart sounds. No murmur heard. Pulmonary:     Effort: Pulmonary effort is normal. No respiratory distress.     Breath sounds: Normal breath sounds. No wheezing, rhonchi or rales.  Abdominal:     General: There is no distension.     Palpations: Abdomen is soft.     Tenderness: There is no abdominal tenderness.  Musculoskeletal:     Cervical back: Neck supple.     Right lower leg: No edema.     Left lower leg: No edema.  Lymphadenopathy:     Cervical: No cervical adenopathy.  Skin:    General: Skin is warm and dry.     Findings: No rash.  Neurological:     Mental Status: She is alert and oriented to person, place, and time. Mental status is at baseline.  Psychiatric:        Mood and Affect: Mood normal.        Behavior: Behavior normal.       No results found for any visits on 03/08/22.  Assessment & Plan     Problem List Items Addressed This Visit       Digestive   Diarrhea of presumed infectious origin - Primary    Concern for possible infectious diarrhea, esp C diff given 3 recent rounds of abx Will test for C diff, GI pathogen panel and O&P Treatment pending results Hold off on immodium until we know it is not infectious May need to consider stopping metformin if not infectious Return precautions discussed      Relevant Orders   Clostridium Difficile by PCR   GI Profile, Stool, PCR   Ova and parasite examination     Return in about 4 months (around 07/07/2022) for CPE.      I, Lavon Paganini, MD, have reviewed all documentation for this visit. The documentation on 03/08/22 for the exam, diagnosis, procedures, and orders are all accurate and complete.   Bacigalupo, [name], MD, MPH [facility]

## 2022-03-08 ENCOUNTER — Ambulatory Visit (INDEPENDENT_AMBULATORY_CARE_PROVIDER_SITE_OTHER): Payer: Medicare HMO | Admitting: Family Medicine

## 2022-03-08 ENCOUNTER — Encounter: Payer: Self-pay | Admitting: Family Medicine

## 2022-03-08 VITALS — BP 130/69 | HR 73 | Temp 98.0°F | Resp 16 | Wt 137.3 lb

## 2022-03-08 DIAGNOSIS — R197 Diarrhea, unspecified: Secondary | ICD-10-CM

## 2022-03-08 NOTE — Assessment & Plan Note (Signed)
Concern for possible infectious diarrhea, esp C diff given 3 recent rounds of abx Will test for C diff, GI pathogen panel and O&P Treatment pending results Hold off on immodium until we know it is not infectious May need to consider stopping metformin if not infectious Return precautions discussed

## 2022-03-09 DIAGNOSIS — R197 Diarrhea, unspecified: Secondary | ICD-10-CM | POA: Diagnosis not present

## 2022-03-11 ENCOUNTER — Other Ambulatory Visit: Payer: Self-pay | Admitting: Family Medicine

## 2022-03-11 DIAGNOSIS — A0472 Enterocolitis due to Clostridium difficile, not specified as recurrent: Secondary | ICD-10-CM

## 2022-03-11 MED ORDER — VANCOMYCIN HCL 125 MG PO CAPS: 125.0000 mg | ORAL_CAPSULE | Freq: Four times a day (QID) | ORAL | 0 refills | Status: AC

## 2022-03-12 LAB — GI PROFILE, STOOL, PCR
Adenovirus F 40/41: NOT DETECTED
Astrovirus: NOT DETECTED
C difficile toxin A/B: DETECTED — AB
Campylobacter: NOT DETECTED
Cryptosporidium: NOT DETECTED
Cyclospora cayetanensis: NOT DETECTED
Entamoeba histolytica: NOT DETECTED
Enteroaggregative E coli: NOT DETECTED
Enteropathogenic E coli: DETECTED — AB
Enterotoxigenic E coli: NOT DETECTED
Giardia lamblia: NOT DETECTED
Norovirus GI/GII: NOT DETECTED
Plesiomonas shigelloides: NOT DETECTED
Rotavirus A: NOT DETECTED
Salmonella: NOT DETECTED
Sapovirus: NOT DETECTED
Shiga-toxin-producing E coli: NOT DETECTED
Shigella/Enteroinvasive E coli: NOT DETECTED
Vibrio cholerae: NOT DETECTED
Vibrio: NOT DETECTED
Yersinia enterocolitica: NOT DETECTED

## 2022-03-12 LAB — CLOSTRIDIUM DIFFICILE BY PCR: Toxigenic C. Difficile by PCR: POSITIVE — AB

## 2022-03-12 LAB — OVA AND PARASITE EXAMINATION

## 2022-05-04 ENCOUNTER — Ambulatory Visit: Payer: Medicare HMO | Admitting: Family Medicine

## 2022-05-06 ENCOUNTER — Other Ambulatory Visit: Payer: Self-pay | Admitting: Family Medicine

## 2022-05-06 DIAGNOSIS — E1169 Type 2 diabetes mellitus with other specified complication: Secondary | ICD-10-CM

## 2022-05-17 ENCOUNTER — Other Ambulatory Visit: Payer: Self-pay | Admitting: Family Medicine

## 2022-05-17 DIAGNOSIS — E1169 Type 2 diabetes mellitus with other specified complication: Secondary | ICD-10-CM

## 2022-05-17 NOTE — Telephone Encounter (Signed)
Requested medication (s) are due for refill today:   Not sure   it's listed as historical  Requested medication (s) are on the active medication list:   Yes as historical  Future visit scheduled:   Yes with Dr. [name]   Last ordered: 03/03/2022 as historical  Returned because it's listed as historical.      Requested Prescriptions  Pending Prescriptions Disp Refills   hydrochlorothiazide (HYDRODIURIL) 25 MG tablet [Pharmacy Med Name: HYDROCHLOROTHIAZIDE 25 MG Tablet] 90 tablet 3    Sig: TAKE 1 TABLET EVERY DAY     Cardiovascular: Diuretics - Thiazide Passed - 05/17/2022  4:47 AM      Passed - Cr in normal range and within 180 days    Creatinine  Date Value Ref Range Status  10/01/2011 0.72 0.60 - 1.30 mg/dL Final   Creatinine, Ser  Date Value Ref Range Status  01/20/2022 0.79 0.57 - 1.00 mg/dL Final         Passed - K in normal range and within 180 days    Potassium  Date Value Ref Range Status  01/20/2022 3.9 3.5 - 5.2 mmol/L Final  10/01/2011 3.3 (L) 3.5 - 5.1 mmol/L Final         Passed - Na in normal range and within 180 days    Sodium  Date Value Ref Range Status  01/20/2022 135 134 - 144 mmol/L Final  10/01/2011 129 (L) 136 - 145 mmol/L Final         Passed - Last BP in normal range    BP Readings from Last 1 Encounters:  03/08/22 130/69         Passed - Valid encounter within last 6 months    Recent Outpatient Visits           2 months ago Diarrhea of presumed infectious origin   [place], [name], MD   3 months ago Type 2 diabetes mellitus with other specified complication, without long-term current use of insulin [facility])   [facility] Tally Joe T, FNP   4 months ago Acute bacterial sinusitis   [facility] Simmons-Robinson, [place], MD   6 months ago [place] and fatigue   [facility] Gwyneth Sprout, FNP   10 months ago Encounter for annual physical exam   [facility], [name], MD       Future Appointments             In 2 months Bacigalupo, [name], MD [facility], [place]

## 2022-05-27 ENCOUNTER — Ambulatory Visit: Payer: Self-pay

## 2022-05-27 ENCOUNTER — Encounter: Payer: Self-pay | Admitting: Physician Assistant

## 2022-05-27 ENCOUNTER — Ambulatory Visit (INDEPENDENT_AMBULATORY_CARE_PROVIDER_SITE_OTHER): Payer: Medicare HMO | Admitting: Physician Assistant

## 2022-05-27 VITALS — BP 147/60 | HR 78 | Wt 140.4 lb

## 2022-05-27 DIAGNOSIS — M79601 Pain in right arm: Secondary | ICD-10-CM

## 2022-05-27 NOTE — Progress Notes (Signed)
I,Sha'taria Tyson,acting as a Education administrator for [employer], PA-C.,have documented all relevant documentation on the behalf of Mikey Kirschner, PA-C,as directed by  Mikey Kirschner, PA-C while in the presence of Mikey Kirschner, PA-C.   Established patient visit   Patient: Jessica Wheeler   DOB: 1940-08-08   82 y.o. Female  MRN: [id number] Visit Date: 05/27/2022  Today's healthcare provider: Mikey Kirschner, PA-C   Cc. Right shoulder pain after vaccine  Subjective    HPI  Pt reports having a shingles vaccine at the pharmacy 05/17/22 and starting a few days after began having right arm and shoulder pain radiating to her neck . Denies fevers, chills, rashes, swelling.  Medications: Outpatient Medications Prior to Visit  Medication Sig   Accu-Chek FastClix Lancets MISC TEST FASTING BLOOD SUGAR EVERY DAY   amLODipine (NORVASC) 5 MG tablet TAKE 1 TABLET EVERY DAY   aspirin 81 MG EC tablet Take 81 mg by mouth daily.   Blood Glucose Monitoring Suppl (ACCU-CHEK GUIDE) w/Device KIT USE AS DIRECTED TO CHECK FASTING BLOOD SUGAR DAILY   cyclobenzaprine (FLEXERIL) 5 MG tablet Take ONE tab PO BID/TID PRN   glucose blood (ACCU-CHEK GUIDE) test strip TEST FASTING BLOOD SUGAR EVERY DAY AS DIRECTED   hydrochlorothiazide (HYDRODIURIL) 25 MG tablet TAKE 1 TABLET EVERY DAY   ibuprofen (ADVIL) 200 MG tablet Take 200 mg by mouth daily at 6 (six) AM.   Lancets Misc. (ACCU-CHEK FASTCLIX LANCET) KIT 1 each by Does not apply route as directed.   latanoprost (XALATAN) 0.005 % ophthalmic solution Place 1 drop into both eyes at bedtime.    losartan (COZAAR) 100 MG tablet Take 1 tablet (100 mg total) by mouth daily.   Magnesium 250 MG TABS Take 250 mg by mouth daily.   metFORMIN (GLUCOPHAGE) 500 MG tablet TAKE 2 TABLETS TWICE DAILY WITH MEALS   vitamin B-12 (CYANOCOBALAMIN) 1000 MCG tablet Take 1,000 mcg by mouth daily.   No facility-administered medications prior to visit.       Objective    Blood pressure  (!) 147/60, pulse 78, weight 140 lb 6.4 oz (63.7 kg), SpO2 98 %.   Physical Exam Vitals reviewed.  Constitutional:      Appearance: She is not ill-appearing.  HENT:     Head: Normocephalic.  Eyes:     Conjunctiva/sclera: Conjunctivae normal.  Cardiovascular:     Rate and Rhythm: Normal rate.  Pulmonary:     Effort: Pulmonary effort is normal. No respiratory distress.  Musculoskeletal:     Comments: Some tenderness right deltoid  Skin:    Comments: No rashes swelling present right upper arm  Neurological:     General: No focal deficit present.     Mental Status: She is alert and oriented to person, place, and time.  Psychiatric:        Mood and Affect: Mood normal.        Behavior: Behavior normal.      No results found for any visits on 05/27/22.  Assessment & Plan     Right arm pain  Radiation pattern along right trapezius 2/2 right IM injection Encouraged switching from ibuprofen to mobic daily  Ice/heat/stretching  Flexeril PRN   Return if symptoms worsen or fail to improve.      I, Mikey Kirschner, PA-C have reviewed all documentation for this visit. The documentation on 05/27/22  for the exam, diagnosis, procedures, and orders are all accurate and complete.  Mikey Kirschner, PA-C [facility] [address] [place], [place], [address] Office: [phone] Fax: [place]

## 2022-05-27 NOTE — Telephone Encounter (Signed)
  Chief Complaint: immunization reaction Symptoms: pain in R arm into neck, painful to touch  Frequency: ongoing since 05/17/22 when pt had shingles shot given  Pertinent Negatives: Patient denies redness at injection site Disposition: [] ED /[] Urgent Care (no appt availability in office) / [x] Appointment(In office/virtual)/ []  [facility]/ [] Home Care/ [] Refused Recommended Disposition /[] [place] [place] [facility]/ []  Follow-up with PCP Additional Notes: pt is very concerned about neck pain that started soon after getting shingles vaccine. Pt states she hasn't had pain like this since she had the surgery on her neck years ago. Pt states she also has joint pain. Scheduled OV today at 1340 with [place], [place].   Reason for Disposition  [1] Pain, tenderness, or swelling at the injection site AND [2] persists > 3 days  Answer Assessment - Initial Assessment Questions 1. SYMPTOMS: "What is the main symptom?" (e.g., redness, swelling, pain)      Pain in R arm and neck pain and joint point 2. ONSET: "When was the vaccine (shot) given?" "How much later did the sx begin?" (e.g., hours, days ago)      05/17/22, immediately  3. SEVERITY: "How bad is it?"      Painful  5. IMMUNIZATIONS GIVEN: "What shots have you recently received?"     Shingles  7. OTHER SYMPTOMS: "Do you have any other symptoms?"     Itching  Protocols used: Immunization Reactions-A-AH

## 2022-06-02 ENCOUNTER — Other Ambulatory Visit: Payer: Self-pay | Admitting: Family Medicine

## 2022-06-02 DIAGNOSIS — Z1231 Encounter for screening mammogram for malignant neoplasm of breast: Secondary | ICD-10-CM

## 2022-06-21 ENCOUNTER — Ambulatory Visit
Admission: RE | Admit: 2022-06-21 | Discharge: 2022-06-21 | Disposition: A | Discharge status: Home / Self Care | Payer: Medicare HMO | Source: Ambulatory Visit | Attending: Family Medicine | Admitting: Family Medicine

## 2022-06-21 DIAGNOSIS — Z1231 Encounter for screening mammogram for malignant neoplasm of breast: Secondary | ICD-10-CM

## 2022-06-27 DIAGNOSIS — H43813 Vitreous degeneration, bilateral: Secondary | ICD-10-CM | POA: Diagnosis not present

## 2022-06-27 DIAGNOSIS — H353132 Nonexudative age-related macular degeneration, bilateral, intermediate dry stage: Secondary | ICD-10-CM | POA: Diagnosis not present

## 2022-06-27 DIAGNOSIS — Z7984 Long term (current) use of oral hypoglycemic drugs: Secondary | ICD-10-CM | POA: Diagnosis not present

## 2022-06-27 DIAGNOSIS — H401131 Primary open-angle glaucoma, bilateral, mild stage: Secondary | ICD-10-CM | POA: Diagnosis not present

## 2022-06-27 DIAGNOSIS — E119 Type 2 diabetes mellitus without complications: Secondary | ICD-10-CM | POA: Diagnosis not present

## 2022-06-27 DIAGNOSIS — Z961 Presence of intraocular lens: Secondary | ICD-10-CM | POA: Diagnosis not present

## 2022-07-07 ENCOUNTER — Emergency Department: Payer: Medicare HMO

## 2022-07-07 ENCOUNTER — Other Ambulatory Visit: Payer: Self-pay

## 2022-07-07 ENCOUNTER — Inpatient Hospital Stay
Admission: EM | Admit: 2022-07-07 | Discharge: 2022-07-10 | DRG: 321 | Disposition: A | Discharge status: Home / Self Care | Payer: Medicare HMO | Attending: Internal Medicine | Admitting: Internal Medicine

## 2022-07-07 DIAGNOSIS — H409 Unspecified glaucoma: Secondary | ICD-10-CM | POA: Diagnosis present

## 2022-07-07 DIAGNOSIS — Z90710 Acquired absence of both cervix and uterus: Secondary | ICD-10-CM

## 2022-07-07 DIAGNOSIS — Z961 Presence of intraocular lens: Secondary | ICD-10-CM | POA: Diagnosis present

## 2022-07-07 DIAGNOSIS — Z882 Allergy status to sulfonamides status: Secondary | ICD-10-CM

## 2022-07-07 DIAGNOSIS — Z87412 Personal history of vulvar dysplasia: Secondary | ICD-10-CM

## 2022-07-07 DIAGNOSIS — Z79899 Other long term (current) drug therapy: Secondary | ICD-10-CM

## 2022-07-07 DIAGNOSIS — E861 Hypovolemia: Secondary | ICD-10-CM | POA: Diagnosis present

## 2022-07-07 DIAGNOSIS — Z9842 Cataract extraction status, left eye: Secondary | ICD-10-CM | POA: Diagnosis not present

## 2022-07-07 DIAGNOSIS — E118 Type 2 diabetes mellitus with unspecified complications: Secondary | ICD-10-CM | POA: Diagnosis not present

## 2022-07-07 DIAGNOSIS — I214 Non-ST elevation (NSTEMI) myocardial infarction: Secondary | ICD-10-CM | POA: Diagnosis not present

## 2022-07-07 DIAGNOSIS — I25110 Atherosclerotic heart disease of native coronary artery with unstable angina pectoris: Secondary | ICD-10-CM | POA: Diagnosis not present

## 2022-07-07 DIAGNOSIS — I701 Atherosclerosis of renal artery: Secondary | ICD-10-CM | POA: Diagnosis present

## 2022-07-07 DIAGNOSIS — Z981 Arthrodesis status: Secondary | ICD-10-CM

## 2022-07-07 DIAGNOSIS — Z8249 Family history of ischemic heart disease and other diseases of the circulatory system: Secondary | ICD-10-CM | POA: Diagnosis not present

## 2022-07-07 DIAGNOSIS — R079 Chest pain, unspecified: Secondary | ICD-10-CM | POA: Diagnosis not present

## 2022-07-07 DIAGNOSIS — I4510 Unspecified right bundle-branch block: Secondary | ICD-10-CM | POA: Diagnosis present

## 2022-07-07 DIAGNOSIS — R0789 Other chest pain: Secondary | ICD-10-CM | POA: Diagnosis not present

## 2022-07-07 DIAGNOSIS — E119 Type 2 diabetes mellitus without complications: Secondary | ICD-10-CM | POA: Diagnosis not present

## 2022-07-07 DIAGNOSIS — I252 Old myocardial infarction: Secondary | ICD-10-CM | POA: Diagnosis not present

## 2022-07-07 DIAGNOSIS — I5021 Acute systolic (congestive) heart failure: Secondary | ICD-10-CM | POA: Diagnosis not present

## 2022-07-07 DIAGNOSIS — I10 Essential (primary) hypertension: Secondary | ICD-10-CM | POA: Diagnosis present

## 2022-07-07 DIAGNOSIS — E878 Other disorders of electrolyte and fluid balance, not elsewhere classified: Secondary | ICD-10-CM | POA: Diagnosis present

## 2022-07-07 DIAGNOSIS — Z888 Allergy status to other drugs, medicaments and biological substances status: Secondary | ICD-10-CM

## 2022-07-07 DIAGNOSIS — Z9841 Cataract extraction status, right eye: Secondary | ICD-10-CM

## 2022-07-07 DIAGNOSIS — Z87891 Personal history of nicotine dependence: Secondary | ICD-10-CM | POA: Diagnosis not present

## 2022-07-07 DIAGNOSIS — E876 Hypokalemia: Secondary | ICD-10-CM | POA: Diagnosis not present

## 2022-07-07 DIAGNOSIS — I2510 Atherosclerotic heart disease of native coronary artery without angina pectoris: Secondary | ICD-10-CM | POA: Diagnosis not present

## 2022-07-07 DIAGNOSIS — Z7984 Long term (current) use of oral hypoglycemic drugs: Secondary | ICD-10-CM

## 2022-07-07 DIAGNOSIS — Z7982 Long term (current) use of aspirin: Secondary | ICD-10-CM

## 2022-07-07 DIAGNOSIS — E785 Hyperlipidemia, unspecified: Secondary | ICD-10-CM | POA: Diagnosis present

## 2022-07-07 DIAGNOSIS — I110 Hypertensive heart disease with heart failure: Secondary | ICD-10-CM | POA: Diagnosis not present

## 2022-07-07 DIAGNOSIS — E871 Hypo-osmolality and hyponatremia: Secondary | ICD-10-CM | POA: Diagnosis present

## 2022-07-07 DIAGNOSIS — Z9049 Acquired absence of other specified parts of digestive tract: Secondary | ICD-10-CM

## 2022-07-07 DIAGNOSIS — H402294 Chronic angle-closure glaucoma, unspecified eye, indeterminate stage: Secondary | ICD-10-CM

## 2022-07-07 DIAGNOSIS — I255 Ischemic cardiomyopathy: Secondary | ICD-10-CM | POA: Diagnosis present

## 2022-07-07 DIAGNOSIS — Z66 Do not resuscitate: Secondary | ICD-10-CM | POA: Diagnosis not present

## 2022-07-07 DIAGNOSIS — Z833 Family history of diabetes mellitus: Secondary | ICD-10-CM

## 2022-07-07 LAB — CBC
HCT: 37.2 % (ref 36.0–46.0)
Hemoglobin: 13 g/dL (ref 12.0–15.0)
MCH: 31.1 pg (ref 26.0–34.0)
MCHC: 34.9 g/dL (ref 30.0–36.0)
MCV: 89 fL (ref 80.0–100.0)
Platelets: 240 10*3/uL (ref 150–400)
RBC: 4.18 MIL/uL (ref 3.87–5.11)
RDW: 13.2 % (ref 11.5–15.5)
WBC: 6.2 10*3/uL (ref 4.0–10.5)
nRBC: 0 % (ref 0.0–0.2)

## 2022-07-07 LAB — CBG MONITORING, ED: Glucose-Capillary: 196 mg/dL — ABNORMAL HIGH (ref 70–99)

## 2022-07-07 LAB — COMPREHENSIVE METABOLIC PANEL
ALT: 25 U/L (ref 0–44)
AST: 32 U/L (ref 15–41)
Albumin: 4.1 g/dL (ref 3.5–5.0)
Alkaline Phosphatase: 46 U/L (ref 38–126)
Anion gap: 13 (ref 5–15)
BUN: 22 mg/dL (ref 8–23)
CO2: 21 mmol/L — ABNORMAL LOW (ref 22–32)
Calcium: 9.2 mg/dL (ref 8.9–10.3)
Chloride: 93 mmol/L — ABNORMAL LOW (ref 98–111)
Creatinine, Ser: 0.69 mg/dL (ref 0.44–1.00)
GFR, Estimated: >60 mL/min (ref >60)
Glucose, Bld: 209 mg/dL — ABNORMAL HIGH (ref 70–99)
Potassium: 3.5 mmol/L (ref 3.5–5.1)
Sodium: 127 mmol/L — ABNORMAL LOW (ref 135–145)
Total Bilirubin: 0.8 mg/dL (ref 0.3–1.2)
Total Protein: 7.1 g/dL (ref 6.5–8.1)

## 2022-07-07 LAB — APTT: aPTT: 25 seconds (ref 24–36)

## 2022-07-07 LAB — TROPONIN I (HIGH SENSITIVITY)
Troponin I (High Sensitivity): 502 ng/L (ref <18)
Troponin I (High Sensitivity): 6442 ng/L (ref <18)

## 2022-07-07 MED ORDER — ATORVASTATIN CALCIUM 20 MG PO TABS
80.0000 mg | ORAL_TABLET | Freq: Every day | ORAL | Status: DC
  Administered 2022-07-07 – 2022-07-09 (×3): 80 mg via ORAL
  Filled 2022-07-07 (×3): qty 4

## 2022-07-07 MED ORDER — HYDROCHLOROTHIAZIDE 25 MG PO TABS
25.0000 mg | ORAL_TABLET | Freq: Every day | ORAL | Status: DC
  Administered 2022-07-08: 25 mg via ORAL
  Filled 2022-07-07: qty 1

## 2022-07-07 MED ORDER — NITROGLYCERIN 0.4 MG SL SUBL
0.4000 mg | SUBLINGUAL_TABLET | SUBLINGUAL | Status: DC | PRN
  Administered 2022-07-07 (×2): 0.4 mg via SUBLINGUAL
  Filled 2022-07-07 (×2): qty 1

## 2022-07-07 MED ORDER — MAGNESIUM HYDROXIDE 400 MG/5ML PO SUSP: 30.0000 mL | Freq: Every day | ORAL | Status: DC | PRN

## 2022-07-07 MED ORDER — INSULIN ASPART 100 UNIT/ML IJ SOLN
0.0000 [IU] | Freq: Every day | INTRAMUSCULAR | Status: DC
  Administered 2022-07-08: 2 [IU] via SUBCUTANEOUS
  Filled 2022-07-07: qty 1

## 2022-07-07 MED ORDER — ALPRAZOLAM 0.25 MG PO TABS: 0.2500 mg | ORAL_TABLET | Freq: Two times a day (BID) | ORAL | Status: DC | PRN

## 2022-07-07 MED ORDER — VITAMIN B-12 1000 MCG PO TABS
1000.0000 ug | ORAL_TABLET | Freq: Every day | ORAL | Status: DC
  Administered 2022-07-08 – 2022-07-10 (×3): 1000 ug via ORAL
  Filled 2022-07-07: qty 2
  Filled 2022-07-07 (×2): qty 1

## 2022-07-07 MED ORDER — HEPARIN (PORCINE) 25000 UT/250ML-% IV SOLN
800.0000 [IU]/h | INTRAVENOUS | Status: DC
  Administered 2022-07-07: 800 [IU]/h via INTRAVENOUS
  Filled 2022-07-07: qty 250

## 2022-07-07 MED ORDER — CYCLOBENZAPRINE HCL 5 MG PO TABS
5.0000 mg | ORAL_TABLET | Freq: Three times a day (TID) | ORAL | Status: DC | PRN
  Administered 2022-07-08: 5 mg via ORAL
  Filled 2022-07-07: qty 1

## 2022-07-07 MED ORDER — INSULIN ASPART 100 UNIT/ML IJ SOLN
0.0000 [IU] | Freq: Three times a day (TID) | INTRAMUSCULAR | Status: DC
  Administered 2022-07-08: 2 [IU] via SUBCUTANEOUS
  Administered 2022-07-09: 1 [IU] via SUBCUTANEOUS
  Administered 2022-07-09 – 2022-07-10 (×4): 2 [IU] via SUBCUTANEOUS
  Filled 2022-07-07 (×6): qty 1

## 2022-07-07 MED ORDER — SODIUM CHLORIDE 0.9 % IV SOLN: INTRAVENOUS | Status: DC

## 2022-07-07 MED ORDER — ACETAMINOPHEN 325 MG PO TABS
650.0000 mg | ORAL_TABLET | ORAL | Status: DC | PRN
  Administered 2022-07-08 – 2022-07-09 (×3): 650 mg via ORAL
  Filled 2022-07-07 (×3): qty 2

## 2022-07-07 MED ORDER — LATANOPROST 0.005 % OP SOLN
1.0000 [drp] | Freq: Every day | OPHTHALMIC | Status: DC
  Administered 2022-07-08 – 2022-07-09 (×2): 1 [drp] via OPHTHALMIC
  Filled 2022-07-07: qty 2.5

## 2022-07-07 MED ORDER — MAGNESIUM OXIDE -MG SUPPLEMENT 400 (240 MG) MG PO TABS
200.0000 mg | ORAL_TABLET | Freq: Every day | ORAL | Status: DC
  Administered 2022-07-08 – 2022-07-10 (×3): 200 mg via ORAL
  Filled 2022-07-07 (×3): qty 1

## 2022-07-07 MED ORDER — AMLODIPINE BESYLATE 5 MG PO TABS
5.0000 mg | ORAL_TABLET | Freq: Every day | ORAL | Status: DC
  Administered 2022-07-08: 5 mg via ORAL
  Filled 2022-07-07: qty 1

## 2022-07-07 MED ORDER — LOSARTAN POTASSIUM 50 MG PO TABS
100.0000 mg | ORAL_TABLET | Freq: Every day | ORAL | Status: DC
  Administered 2022-07-08: 100 mg via ORAL
  Filled 2022-07-07: qty 2

## 2022-07-07 MED ORDER — TRAZODONE HCL 50 MG PO TABS: 25.0000 mg | ORAL_TABLET | Freq: Every evening | ORAL | Status: DC | PRN

## 2022-07-07 MED ORDER — NITROGLYCERIN 0.4 MG SL SUBL: 0.4000 mg | SUBLINGUAL_TABLET | SUBLINGUAL | Status: DC | PRN

## 2022-07-07 MED ORDER — ASPIRIN 300 MG RE SUPP: 300.0000 mg | RECTAL | Status: AC

## 2022-07-07 MED ORDER — ASPIRIN 81 MG PO TBEC
81.0000 mg | DELAYED_RELEASE_TABLET | Freq: Every day | ORAL | Status: DC
  Administered 2022-07-08 – 2022-07-10 (×3): 81 mg via ORAL
  Filled 2022-07-07 (×3): qty 1

## 2022-07-07 MED ORDER — ASPIRIN 81 MG PO CHEW
324.0000 mg | CHEWABLE_TABLET | ORAL | Status: AC
  Administered 2022-07-07: 81 mg via ORAL
  Filled 2022-07-07: qty 4

## 2022-07-07 MED ORDER — ONDANSETRON HCL 4 MG/2ML IJ SOLN: 4.0000 mg | Freq: Four times a day (QID) | INTRAMUSCULAR | Status: DC | PRN

## 2022-07-07 MED ORDER — ASPIRIN 81 MG PO TBEC: 81.0000 mg | DELAYED_RELEASE_TABLET | Freq: Every day | ORAL | Status: DC

## 2022-07-07 MED ORDER — ACETAMINOPHEN 500 MG PO TABS
1000.0000 mg | ORAL_TABLET | Freq: Once | ORAL | Status: AC | PRN
  Administered 2022-07-07: 1000 mg via ORAL
  Filled 2022-07-07: qty 2

## 2022-07-07 MED ORDER — MORPHINE SULFATE (PF) 2 MG/ML IV SOLN: 2.0000 mg | INTRAVENOUS | Status: DC | PRN

## 2022-07-07 MED ORDER — HEPARIN BOLUS VIA INFUSION
4000.0000 [IU] | Freq: Once | INTRAVENOUS | Status: AC
  Administered 2022-07-07: 4000 [IU] via INTRAVENOUS
  Filled 2022-07-07: qty 4000

## 2022-07-07 NOTE — ED Notes (Signed)
Lab reports troponin 502; acuity level changed and pt will be taken to next available exam room

## 2022-07-07 NOTE — ED Provider Notes (Signed)
[facility] Provider Note    Event Date/Time   First MD Initiated Contact with Patient 07/07/22 2125     (approximate)   History   Chief Complaint: Chest Pain   HPI  Jessica Wheeler is a 82 y.o. 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  No shortness of breath or diaphoresis but did feel nauseated.  Feels different from previous episodes of chest pain that she has had in the past which were attributed to muscle spasm and electrolyte issues previously.  Prior to this, she noticed this morning she had much lower energy than usual.  She normally walks 2 miles a day which includes walking up a hill 6 times.  Today she reports that after walking up the hill 1 time, she was completely exhausted and had to stop.  When pain started this evening, it was initially severe.  Currently very mild about 1/10.  She sees cardiology Dr. Rockey Situ.     Physical Exam   Triage Vital Signs: ED Triage Vitals  Enc Vitals Group     BP 07/07/22 1952 (!) 152/65     Pulse Rate 07/07/22 1952 87     Resp 07/07/22 1952 19     Temp 07/07/22 1952 97.7 F (36.5 C)     Temp Source 07/07/22 1952 Oral     SpO2 07/07/22 1952 98 %     Weight 07/07/22 1953 141 lb (64 kg)     Height 07/07/22 1953 '5\' 3"'$  (1.6 m)     Head Circumference --      Peak Flow --      Pain Score 07/07/22 1952 5     Pain Loc --      Pain Edu? --      Excl. in [place]? --     Most recent vital signs: Vitals:   07/07/22 1952  BP: (!) 152/65  Pulse: 87  Resp: 19  Temp: 97.7 F (36.5 C)  SpO2: 98%    General: Awake, no distress.  CV:  Good peripheral perfusion.  Regular rate rhythm, heart rate 90 Resp:  Normal effort.  Clear to auscultation bilaterally Abd:  No distention.  Soft nontender Other:  No lower extremity  edema.   ED Results / Procedures / Treatments   Labs (all labs ordered are listed, but only abnormal results are displayed) Labs Reviewed  COMPREHENSIVE METABOLIC PANEL - Abnormal; Notable for the following components:      Result Value   Sodium 127 (*)    Chloride 93 (*)    CO2 21 (*)    Glucose, Bld 209 (*)    All other components within normal limits  TROPONIN I (HIGH SENSITIVITY) - Abnormal; Notable for the following components:   Troponin I (High Sensitivity) 502 (*)    All other components within normal limits  CBC  APTT  TROPONIN I (HIGH SENSITIVITY)     EKG Interpreted by me Sinus rhythm rate of 88.  Normal axis, normal intervals, right bundle branch block.  No acute ischemic changes.   RADIOLOGY Chest x-ray interpreted by me, appears normal.  Radiology report reviewed   PROCEDURES:  .Critical Care  Performed by: Carrie Mew, MD Authorized by: Carrie Mew, MD   Critical care provider statement:    Critical care time (minutes):  35   Critical  care time was exclusive of:  Separately billable procedures and treating other patients   Critical care was necessary to treat or prevent imminent or life-threatening deterioration of the following conditions:  Cardiac failure   Critical care was time spent personally by me on the following activities:  Development of treatment plan with patient or surrogate, discussions with consultants, evaluation of patient's response to treatment, examination of patient, obtaining history from patient or surrogate, ordering and performing treatments and interventions, ordering and review of laboratory studies, ordering and review of radiographic studies, pulse oximetry, re-evaluation of patient's condition and review of old charts   Care discussed with: admitting provider      MEDICATIONS ORDERED IN ED: Medications  nitroGLYCERIN (NITROSTAT) SL tablet 0.4 mg (has no administration in time range)  acetaminophen (TYLENOL)  tablet 1,000 mg (has no administration in time range)     IMPRESSION / MDM / ASSESSMENT AND PLAN / ED COURSE  I reviewed the triage vital signs and the nursing notes.  DDx: Electrolyte abnormality, GERD, pneumonia, pneumothorax, non-STEMI  Patient's presentation is most consistent with acute presentation with potential threat to life or bodily function.  Patient presents with central chest tightness.  EKG and chest x-ray unremarkable.  Discomfort is currently very mild.  Troponin is 500 indicating non-STEMI.  Doubt PE or acute dissection.  She took 243 mg of aspirin earlier today when the pain started.  Will do a trial of nitroglycerin to see if pain can resolve.  Will start heparin and plan to admit.       FINAL CLINICAL IMPRESSION(S) / ED DIAGNOSES   Final diagnoses:  NSTEMI (non-ST elevated myocardial infarction) ([place])  Type 2 diabetes mellitus without complication, without long-term current use of insulin ([place])     Rx / DC Orders   ED Discharge Orders     None        Note:  This document was prepared using Dragon voice recognition software and may include unintentional dictation errors.   Carrie Mew, MD 07/07/22 2147

## 2022-07-07 NOTE — Assessment & Plan Note (Signed)
-   We will continue her antihypertensives. 

## 2022-07-07 NOTE — Assessment & Plan Note (Signed)
-   We will continue ophthalmic gtt. 

## 2022-07-07 NOTE — Assessment & Plan Note (Signed)
-   The patient will be admitted to a progressive unit bed. - We will continue IV heparin. - We will place on high-dose statin therapy. - She will be placed on as needed sublingual nitroglycerin and IV morphine sulfate for pain. - Cardiology consult to be obtained. - I notified Dr. Curt Bears about the patient. - The patient wanted Dr. Rockey Situ to perform work cardiac catheterization. - 2D echo will be obtained.

## 2022-07-07 NOTE — ED Notes (Signed)
Pt to room 8 via w/c with no distress noted accomp by family member; assisted into hosp gown & on card monitor; Dr Joni Fears and care nurse T Ririe RN notified of pt's arrival and results

## 2022-07-07 NOTE — ED Notes (Signed)
Pt given graham crackers, PB and cup of water prior to being NPA at midnight.

## 2022-07-07 NOTE — H&P (Addendum)
[place]   PATIENT NAME: Jessica Wheeler    MR#:  [id number]  DATE OF BIRTH:  1940/10/07  DATE OF ADMISSION:  07/07/2022  PRIMARY CARE PHYSICIAN: Virginia Crews, MD   Patient is coming from: Home  REQUESTING/REFERRING PHYSICIAN: Brenton Grills, MD  CHIEF COMPLAINT:   Chief Complaint  Patient presents with   Chest Pain    HISTORY OF PRESENT ILLNESS:  Jessica Wheeler is a 82 y.o. Caucasian female with medical history significant for hypertension, dyslipidemia, glaucoma, type 2 diabetes mellitus, osteoarthritis, aortic dissection and right bundle branch block, who presented to the emergency room with acute onset of midsternal chest pain felt this tightness and graded 7/10 in severity with radiation to her neck and associated nausea without vomiting or diaphoresis.  She denies any cough or wheezing or dyspnea.  No bleeding diathesis.  No dysuria, oliguria or hematuria or flank pain.  No fever or chills.  ED Course: When the patient came to the ER, BP was 152/65 with otherwise normal vital signs.  Labs revealed hyponatremia 127 and hypochloremia of 93 with a CO2 of 21 and blood glucose of 209.  High sensitivity troponin I came back 502.  CBC was within normal.    EKG as reviewed by me : EKG showed normal sinus rhythm with a rate of 88 with possible left atrial enlargement, right bundle branch block and T wave inversion inferiorly Imaging: Portable chest x-ray showed stable mild interstitial prominence favoring chronic residual lung disease with no active disease.  The patient was given IV heparin bolus and drip, sublingual nitroglycerin and 1 g of p.o. Tylenol.  Her pain has been significantly better and was down to 1/10.  She will be admitted to a progressive unit bed for further evaluation and management. PAST MEDICAL HISTORY:   Past Medical History:  Diagnosis Date   Allergy    Arthritis    Bundle branch block, right    C7 cervical fracture (HCC)    Diabetes  mellitus    Diffuse cystic mastopathy    Dissection, aorta (HCC)    Dr.Dew follows   Diverticulosis    Family history of malignant neoplasm of gastrointestinal tract    Glaucoma 2005   Heart murmur    History of bronchitis    History of pancreatitis 2005   Hyperlipidemia    Hypertension 1982   Personal history of tobacco use, presenting hazards to health    RBBB    Ulcer     PAST SURGICAL HISTORY:   Past Surgical History:  Procedure Laterality Date   ABDOMINAL HYSTERECTOMY  1991   ANTERIOR CERVICAL CORPECTOMY N/A 03/30/2020   Procedure: CORPECTOMY CERVICAL FOUR - CERVICAL FIVE;  Surgeon: Newman Pies, MD;  Location: [place];  Service: Neurosurgery;  Laterality: N/A;  anterior   APPENDECTOMY     BACK SURGERY  1982   for lumbar ruptured disc. No hardware, no discectomy, no fusion   BLADDER SURGERY     BREAST BIOPSY Right 1980's   benign   BREAST EXCISIONAL BIOPSY Right 1980s   benign   BREAST SURGERY     biopsy   CARDIAC CATHETERIZATION     Dr. Ubaldo Glassing, few years ago   CATARACT EXTRACTION W/PHACO Left 08/31/2015   Procedure: CATARACT EXTRACTION PHACO AND INTRAOCULAR LENS PLACEMENT ([place]);  Surgeon: Ronnell Freshwater, MD;  Location: [place];  Service: Ophthalmology;  Laterality: Left;  DIABETIC - oral meds   CATARACT EXTRACTION W/PHACO Right 10/12/2015  Procedure: CATARACT EXTRACTION PHACO AND INTRAOCULAR LENS PLACEMENT ([place]) right eye;  Surgeon: Ronnell Freshwater, MD;  Location: [place];  Service: Ophthalmology;  Laterality: Right;  DIABETIC - oral meds   CHOLECYSTECTOMY  1994   COLONOSCOPY  2008,02/22/2012   Dr. Sankar-2013   COLONOSCOPY WITH PROPOFOL N/A 04/04/2017   Procedure: COLONOSCOPY WITH PROPOFOL;  Surgeon: Christene Lye, MD;  Location[facility];  Service: Endoscopy;  Laterality: N/A;   POSTERIOR CERVICAL FUSION/FORAMINOTOMY N/A 04/01/2020   Procedure: POSTERIOR CERVICAL FUSION/FORAMINOTOMY CERVICAL FOUR- CERVICAL  SIX;  Surgeon: Newman Pies, MD;  Location: [place];  Service: Neurosurgery;  Laterality: N/A;   REFRACTIVE SURGERY      SOCIAL HISTORY:   Social History   Tobacco Use   Smoking status: Former    Packs/day: 1.00    Years: 15.00    Total pack years: 15.00    Types: Cigarettes    Quit date: 08/03/2003    Years since quitting: 18.9   Smokeless tobacco: Never  Substance Use Topics   Alcohol use: No    FAMILY HISTORY:   Family History  Problem Relation Age of Onset   Diabetes Mother    Hypertension Mother    Anxiety disorder Mother    Heart disease Father        rheumatic heart disease   Heart attack Father    Rheum arthritis Father    Colon cancer Brother    Hypertension Maternal Grandmother    Hypertension Maternal Grandfather    Breast cancer Maternal Aunt 60   Breast cancer Cousin 50    DRUG ALLERGIES:   Allergies  Allergen Reactions   Beta Adrenergic Blockers     Chest pressure/difficulty breathing   Jardiance [Empagliflozin]     Muscle cramps   Levaquin [Levofloxacin In D5w]     Low blood sugar; does take this when she needs it. She just makes sure to control her BS to avoid hypoglycemia.   Lisinopril Cough   Zetia [Ezetimibe]     Lethargic    Prevnar [Pneumococcal 13-Val Conj Vacc] Rash    Joint Pain   Sulfa Antibiotics Rash    "makes my skin raw"    REVIEW OF SYSTEMS:   ROS As per history of present illness. All pertinent systems were reviewed above. Constitutional, HEENT, cardiovascular, respiratory, GI, GU, musculoskeletal, neuro, psychiatric, endocrine, integumentary and hematologic systems were reviewed and are otherwise negative/unremarkable except for positive findings mentioned above in the HPI.   MEDICATIONS AT HOME:   Prior to Admission medications   Medication Sig Start Date End Date Taking? Authorizing Provider  Accu-Chek FastClix Lancets MISC TEST FASTING BLOOD SUGAR EVERY DAY 05/17/22  Yes Bacigalupo, [name], MD  amLODipine  (NORVASC) 5 MG tablet TAKE 1 TABLET EVERY DAY 12/24/21  Yes Bacigalupo, [name], MD  aspirin 81 MG EC tablet Take 81 mg by mouth daily.   Yes [provider]  Blood Glucose Monitoring Suppl (ACCU-CHEK GUIDE) w/Device KIT USE AS DIRECTED TO CHECK FASTING BLOOD SUGAR DAILY 12/11/20  Yes Bacigalupo, [name], MD  glucose blood (ACCU-CHEK GUIDE) test strip TEST FASTING BLOOD SUGAR EVERY DAY AS DIRECTED 07/20/21  Yes Bacigalupo, [name], MD  hydrochlorothiazide (HYDRODIURIL) 25 MG tablet TAKE 1 TABLET EVERY DAY 05/18/22  Yes Bacigalupo, [name], MD  ibuprofen (ADVIL) 200 MG tablet Take 200 mg by mouth daily at 6 (six) AM.   Yes [provider]  Lancets Misc. (ACCU-CHEK FASTCLIX LANCET) KIT 1 each by Does not  apply route as directed. 06/04/20  Yes Bacigalupo, [name], MD  latanoprost (XALATAN) 0.005 % ophthalmic solution Place 1 drop into both eyes at bedtime.  04/09/15  Yes [provider]  losartan (COZAAR) 100 MG tablet Take 1 tablet (100 mg total) by mouth daily. 01/20/22  Yes Tally Joe T, FNP  Magnesium 250 MG TABS Take 250 mg by mouth daily.   Yes [provider]  metFORMIN (GLUCOPHAGE) 500 MG tablet TAKE 2 TABLETS TWICE DAILY WITH MEALS 05/06/22  Yes Bacigalupo, [name], MD  vitamin B-12 (CYANOCOBALAMIN) 1000 MCG tablet Take 1,000 mcg by mouth daily.   Yes [provider]  cyclobenzaprine (FLEXERIL) 5 MG tablet Take ONE tab PO BID/TID PRN    [provider]      VITAL SIGNS:  Blood pressure 126/64, pulse 77, temperature 97.7 F (36.5 C), temperature source Oral, resp. rate 18, height '5\' 3"'$  (1.6 m), weight 64 kg, SpO2 98 %.  PHYSICAL EXAMINATION:  Physical Exam  GENERAL:  82 y.o.-year-old Caucasian female patient lying in the bed with no acute distress.  EYES: Pupils equal, round, reactive to light and accommodation. No scleral icterus. Extraocular muscles intact.  HEENT: Head atraumatic, normocephalic. Oropharynx and nasopharynx clear.   NECK:  Supple, no jugular venous distention. No thyroid enlargement, no tenderness.  LUNGS: Normal breath sounds bilaterally, no wheezing, rales,rhonchi or crepitation. No use of accessory muscles of respiration.  CARDIOVASCULAR: Regular rate and rhythm, S1, S2 normal. No murmurs, rubs, or gallops.  ABDOMEN: Soft, nondistended, nontender. Bowel sounds present. No organomegaly or mass.  EXTREMITIES: No pedal edema, cyanosis, or clubbing.  NEUROLOGIC: Cranial nerves II through XII are intact. Muscle strength 5/5 in all extremities. Sensation intact. Gait not checked.  PSYCHIATRIC: The patient is alert and oriented x 3.  Normal affect and good eye contact. SKIN: No obvious rash, lesion, or ulcer.   LABORATORY PANEL:   CBC Recent Labs  Lab 07/07/22 1959  WBC 6.2  HGB 13.0  HCT 37.2  PLT 240   ------------------------------------------------------------------------------------------------------------------  Chemistries  Recent Labs  Lab 07/07/22 1959  NA 127*  K 3.5  CL 93*  CO2 21*  GLUCOSE 209*  BUN 22  CREATININE 0.69  CALCIUM 9.2  AST 32  ALT 25  ALKPHOS 46  BILITOT 0.8   ------------------------------------------------------------------------------------------------------------------  Cardiac Enzymes No results for input(s): "TROPONINI" in the last 168 hours. ------------------------------------------------------------------------------------------------------------------  RADIOLOGY:  DG Chest 1 View  Result Date: 07/07/2022 CLINICAL DATA:  Chest pain EXAM: CHEST  1 VIEW COMPARISON:  04/20/2021 FINDINGS: Heart and mediastinal contours within normal limits. Continued mild diffuse interstitial prominence, similar prior study. No acute confluent opacities or effusions. No acute bony abnormality. IMPRESSION: Stable mild interstitial prominence, favor chronic interstitial lung disease. No active disease. Electronically Signed   By: Rolm Baptise M.D.   On: 07/07/2022 20:17       IMPRESSION AND PLAN:  Assessment and Plan: * NSTEMI (non-ST elevated myocardial infarction) [facility]) - The patient will be admitted to a progressive unit bed. - We will continue IV heparin. - We will place on high-dose statin therapy. - She will be placed on as needed sublingual nitroglycerin and IV morphine sulfate for pain. - Cardiology consult to be obtained. - I notified Dr. Curt Bears about the patient. - The patient wanted Dr. Rockey Situ to perform work cardiac catheterization. - 2D echo will be obtained.  Hyponatremia - This is likely hypovolemic. - She will be hydrated with IV normal saline and will follow BMP.  Type 2 diabetes mellitus without complications (HCC) - We will place her on supplemental coverage with NovoLog. - We will hold off metformin.  Essential hypertension - We will continue her antihypertensives.  Glaucoma - We will continue ophthalmic gtt.   DVT prophylaxis: IV heparin. Advanced Care Planning:  Code Status: The patient is DNR.  She agrees with intubation in the case of respiratory arrest without cardiac arrest. Family Communication:  The plan of care was discussed in details with the patient (and family). I answered all questions. The patient agreed to proceed with the above mentioned plan. Further management will depend upon hospital course. Disposition Plan: Back to previous home environment Consults called: Cardiology. All the records are reviewed and case discussed with ED provider.  Status is: Inpatient   At the time of the admission, it appears that the appropriate admission status for this patient is inpatient.  This is judged to be reasonable and necessary in order to provide the required intensity of service to ensure the patient's safety given the presenting symptoms, physical exam findings and initial radiographic and laboratory data in the context of comorbid conditions.  The patient requires inpatient status due to high intensity of  service, high risk of further deterioration and high frequency of surveillance required.  I certify that at the time of admission, it is my clinical judgment that the patient will require inpatient hospital care extending more than 2 midnights.                            Dispo: The patient is from: Home              Anticipated d/c is to: Home              Patient currently is not medically stable to d/c.              Difficult to place patient: No  Christel Mormon M.D on 07/07/2022 at 11:46 PM  [facility]   From 7 PM-7 AM, contact night-coverage [email or web address]  CC: Primary care physician; Virginia Crews, MD

## 2022-07-07 NOTE — ED Triage Notes (Signed)
Pt presents to ER via ems from home with c/o chest tightness that started tonight at 1800.  Pt states pain is in center of her chest and goes up into her chin.  Pt states the pain will lighten up, but then comes back.  Pt denies sob, but states she has has had some dizziness and nausea.  Pt denies significant cardiac hx.  Pt is otherwise A&O x4 and in NAD in triage.

## 2022-07-07 NOTE — Consult Note (Signed)
ANTICOAGULATION CONSULT NOTE - Initial Consult  Pharmacy Consult for heparin infusion Indication: chest pain/ACS  Allergies  Allergen Reactions   Beta Adrenergic Blockers     Chest pressure/difficulty breathing   Jardiance [Empagliflozin]     Muscle cramps   Levaquin [Levofloxacin In D5w]     Low blood sugar; does take this when she needs it. She just makes sure to control her BS to avoid hypoglycemia.   Lisinopril Cough   Zetia [Ezetimibe]     Lethargic    Prevnar [Pneumococcal 13-Val Conj Vacc] Rash    Joint Pain   Sulfa Antibiotics Rash    "makes my skin raw"    Patient Measurements: Height: '5\' 3"'$  (160 cm) Weight: 64 kg (141 lb) IBW/kg (Calculated) : 52.4 Heparin Dosing Weight: 64 kg  Vital Signs: Temp: 97.7 F (36.5 C) (03/07 1952) Temp Source: Oral (03/07 1952) BP: 152/65 (03/07 1952) Pulse Rate: 87 (03/07 1952)  Labs: Recent Labs    07/07/22 1959  HGB 13.0  HCT 37.2  PLT 240  CREATININE 0.69  TROPONINIHS 502*    Estimated Creatinine Clearance: 47.9 mL/min (by C-G formula based on SCr of 0.69 mg/dL).   Medical History: Past Medical History:  Diagnosis Date   Allergy    Arthritis    Bundle branch block, right    C7 cervical fracture (HCC)    Diabetes mellitus    Diffuse cystic mastopathy    Dissection, aorta (HCC)    Dr.Dew follows   Diverticulosis    Family history of malignant neoplasm of gastrointestinal tract    Glaucoma 2005   Heart murmur    History of bronchitis    History of pancreatitis 2005   Hyperlipidemia    Hypertension 1982   Personal history of tobacco use, presenting hazards to health    RBBB    Ulcer     Medications:  PTA: N/A Inpatient: Heparin infusion (3/7 >>>) Allergies: No AC/APT related allergies  Assessment: 82 year old female with PMH HTN, DM, presents with chest pain radiating to chin. Pharmacy consulted for management of heparin infusion in the setting of NSTEMI.  Goal of Therapy:  Heparin level 0.3-0.7  units/ml Monitor platelets by anticoagulation protocol: Yes   Plan:  Give 4000 units bolus x1; then start heparin infusion at 800 units/hr Check anti-Xa level in 8 hours and daily once consecutively therapeutic. Continue to monitor H&H and platelets daily while on heparin gtt.   [place] Pharmacist 07/07/2022 9:45 PM

## 2022-07-07 NOTE — Assessment & Plan Note (Signed)
-   We will place her on supplemental coverage with NovoLog. - We will hold off metformin.

## 2022-07-07 NOTE — Assessment & Plan Note (Signed)
-   This is likely hypovolemic. - She will be hydrated with IV normal saline and will follow BMP.

## 2022-07-08 ENCOUNTER — Encounter
Admission: EM | Disposition: A | Discharge status: Home / Self Care | Payer: Self-pay | Source: Home / Self Care | Attending: Internal Medicine

## 2022-07-08 ENCOUNTER — Other Ambulatory Visit: Payer: Self-pay

## 2022-07-08 DIAGNOSIS — E119 Type 2 diabetes mellitus without complications: Secondary | ICD-10-CM | POA: Diagnosis not present

## 2022-07-08 DIAGNOSIS — E871 Hypo-osmolality and hyponatremia: Secondary | ICD-10-CM | POA: Diagnosis not present

## 2022-07-08 DIAGNOSIS — I2510 Atherosclerotic heart disease of native coronary artery without angina pectoris: Secondary | ICD-10-CM

## 2022-07-08 DIAGNOSIS — E876 Hypokalemia: Secondary | ICD-10-CM | POA: Insufficient documentation

## 2022-07-08 DIAGNOSIS — I214 Non-ST elevation (NSTEMI) myocardial infarction: Secondary | ICD-10-CM | POA: Diagnosis not present

## 2022-07-08 DIAGNOSIS — E785 Hyperlipidemia, unspecified: Secondary | ICD-10-CM

## 2022-07-08 HISTORY — PX: RIGHT/LEFT HEART CATH AND CORONARY ANGIOGRAPHY: CATH118266

## 2022-07-08 HISTORY — PX: CORONARY STENT INTERVENTION: CATH118234

## 2022-07-08 LAB — POCT I-STAT 7, (LYTES, BLD GAS, ICA,H+H)
Acid-base deficit: 4 mmol/L — ABNORMAL HIGH (ref 0.0–2.0)
Bicarbonate: 20.6 mmol/L (ref 20.0–28.0)
Calcium, Ion: 1.1 mmol/L — ABNORMAL LOW (ref 1.15–1.40)
HCT: 34 % — ABNORMAL LOW (ref 36.0–46.0)
Hemoglobin: 11.6 g/dL — ABNORMAL LOW (ref 12.0–15.0)
O2 Saturation: 92 %
Potassium: 2.8 mmol/L — ABNORMAL LOW (ref 3.5–5.1)
Sodium: 125 mmol/L — ABNORMAL LOW (ref 135–145)
TCO2: 22 mmol/L (ref 22–32)
pCO2 arterial: 35.3 mmHg (ref 32–48)
pH, Arterial: 7.373 (ref 7.35–7.45)
pO2, Arterial: 65 mmHg — ABNORMAL LOW (ref 83–108)

## 2022-07-08 LAB — BASIC METABOLIC PANEL
Anion gap: 12 (ref 5–15)
BUN: 16 mg/dL (ref 8–23)
CO2: 21 mmol/L — ABNORMAL LOW (ref 22–32)
Calcium: 9 mg/dL (ref 8.9–10.3)
Chloride: 94 mmol/L — ABNORMAL LOW (ref 98–111)
Creatinine, Ser: 0.56 mg/dL (ref 0.44–1.00)
GFR, Estimated: >60 mL/min (ref >60)
Glucose, Bld: 161 mg/dL — ABNORMAL HIGH (ref 70–99)
Potassium: 3.4 mmol/L — ABNORMAL LOW (ref 3.5–5.1)
Sodium: 127 mmol/L — ABNORMAL LOW (ref 135–145)

## 2022-07-08 LAB — GLUCOSE, CAPILLARY
Glucose-Capillary: 178 mg/dL — ABNORMAL HIGH (ref 70–99)
Glucose-Capillary: 197 mg/dL — ABNORMAL HIGH (ref 70–99)
Glucose-Capillary: 219 mg/dL — ABNORMAL HIGH (ref 70–99)

## 2022-07-08 LAB — LIPID PANEL
Cholesterol: 197 mg/dL (ref 0–200)
HDL: 103 mg/dL (ref >40)
LDL Cholesterol: 82 mg/dL (ref 0–99)
Total CHOL/HDL Ratio: 1.9 RATIO
Triglycerides: 62 mg/dL (ref <150)
VLDL: 12 mg/dL (ref 0–40)

## 2022-07-08 LAB — POCT ACTIVATED CLOTTING TIME
Activated Clotting Time: 260 seconds
Activated Clotting Time: 282 seconds
Activated Clotting Time: 266 seconds

## 2022-07-08 LAB — POCT I-STAT EG7
Acid-base deficit: 4 mmol/L — ABNORMAL HIGH (ref 0.0–2.0)
Bicarbonate: 21.7 mmol/L (ref 20.0–28.0)
Calcium, Ion: 1.12 mmol/L — ABNORMAL LOW (ref 1.15–1.40)
HCT: 36 % (ref 36.0–46.0)
Hemoglobin: 12.2 g/dL (ref 12.0–15.0)
O2 Saturation: 63 %
Potassium: 2.8 mmol/L — ABNORMAL LOW (ref 3.5–5.1)
Sodium: 124 mmol/L — ABNORMAL LOW (ref 135–145)
TCO2: 23 mmol/L (ref 22–32)
pCO2, Ven: 39.4 mmHg — ABNORMAL LOW (ref 44–60)
pH, Ven: 7.35 (ref 7.25–7.43)
pO2, Ven: 34 mmHg (ref 32–45)

## 2022-07-08 LAB — HEPARIN LEVEL (UNFRACTIONATED): Heparin Unfractionated: 0.41 IU/mL (ref 0.30–0.70)

## 2022-07-08 LAB — CBC
HCT: 36.1 % (ref 36.0–46.0)
Hemoglobin: 12.8 g/dL (ref 12.0–15.0)
MCH: 31.2 pg (ref 26.0–34.0)
MCHC: 35.5 g/dL (ref 30.0–36.0)
MCV: 88 fL (ref 80.0–100.0)
Platelets: 220 10*3/uL (ref 150–400)
RBC: 4.1 MIL/uL (ref 3.87–5.11)
RDW: 13.1 % (ref 11.5–15.5)
WBC: 8.8 10*3/uL (ref 4.0–10.5)
nRBC: 0 % (ref 0.0–0.2)

## 2022-07-08 LAB — PROTIME-INR
INR: 1 (ref 0.8–1.2)
Prothrombin Time: 13.1 seconds (ref 11.4–15.2)

## 2022-07-08 LAB — CBG MONITORING, ED
Glucose-Capillary: 165 mg/dL — ABNORMAL HIGH (ref 70–99)
Glucose-Capillary: 187 mg/dL — ABNORMAL HIGH (ref 70–99)

## 2022-07-08 LAB — MRSA NEXT GEN BY PCR, NASAL: MRSA by PCR Next Gen: NOT DETECTED

## 2022-07-08 SURGERY — RIGHT/LEFT HEART CATH AND CORONARY ANGIOGRAPHY
Anesthesia: Moderate Sedation

## 2022-07-08 MED ORDER — ONDANSETRON HCL 4 MG/2ML IJ SOLN
INTRAMUSCULAR | Status: AC
  Filled 2022-07-08: qty 2

## 2022-07-08 MED ORDER — SODIUM CHLORIDE 0.9 % IV SOLN: 250.0000 mL | INTRAVENOUS | Status: DC | PRN

## 2022-07-08 MED ORDER — NITROGLYCERIN 1 MG/10 ML FOR IR/CATH LAB
INTRA_ARTERIAL | Status: AC
  Filled 2022-07-08: qty 10

## 2022-07-08 MED ORDER — TICAGRELOR 90 MG PO TABS
ORAL_TABLET | ORAL | Status: AC
  Filled 2022-07-08: qty 2

## 2022-07-08 MED ORDER — SODIUM CHLORIDE 0.9% FLUSH: 3.0000 mL | INTRAVENOUS | Status: DC | PRN

## 2022-07-08 MED ORDER — VERAPAMIL HCL 2.5 MG/ML IV SOLN
INTRAVENOUS | Status: DC | PRN
  Administered 2022-07-08 (×2): 2.5 mg via INTRA_ARTERIAL

## 2022-07-08 MED ORDER — SODIUM CHLORIDE 0.9% FLUSH
3.0000 mL | Freq: Two times a day (BID) | INTRAVENOUS | Status: DC
  Administered 2022-07-08 – 2022-07-10 (×4): 3 mL via INTRAVENOUS

## 2022-07-08 MED ORDER — MIDAZOLAM HCL 2 MG/2ML IJ SOLN
INTRAMUSCULAR | Status: AC
  Filled 2022-07-08: qty 2

## 2022-07-08 MED ORDER — SODIUM CHLORIDE 0.9 % WEIGHT BASED INFUSION: 1.0000 mL/kg/h | INTRAVENOUS | Status: DC

## 2022-07-08 MED ORDER — POTASSIUM CHLORIDE CRYS ER 20 MEQ PO TBCR
40.0000 meq | EXTENDED_RELEASE_TABLET | ORAL | Status: AC
  Administered 2022-07-08 (×2): 40 meq via ORAL
  Filled 2022-07-08 (×2): qty 2

## 2022-07-08 MED ORDER — ADENOSINE (DIAGNOSTIC) FOR INTRACORONARY USE
INTRAVENOUS | Status: DC | PRN
  Administered 2022-07-08 (×2): 72 ug via INTRACORONARY

## 2022-07-08 MED ORDER — FUROSEMIDE 10 MG/ML IJ SOLN
INTRAMUSCULAR | Status: DC | PRN
  Administered 2022-07-08: 40 mg via INTRAVENOUS

## 2022-07-08 MED ORDER — FUROSEMIDE 10 MG/ML IJ SOLN
INTRAMUSCULAR | Status: AC
  Filled 2022-07-08: qty 4

## 2022-07-08 MED ORDER — FENTANYL CITRATE (PF) 100 MCG/2ML IJ SOLN
INTRAMUSCULAR | Status: AC
  Filled 2022-07-08: qty 2

## 2022-07-08 MED ORDER — CHLORHEXIDINE GLUCONATE CLOTH 2 % EX PADS: 6.0000 | MEDICATED_PAD | Freq: Every day | CUTANEOUS | Status: DC

## 2022-07-08 MED ORDER — HEPARIN (PORCINE) IN NACL 1000-0.9 UT/500ML-% IV SOLN
INTRAVENOUS | Status: DC | PRN
  Administered 2022-07-08: 500 mL

## 2022-07-08 MED ORDER — NITROGLYCERIN IN D5W 200-5 MCG/ML-% IV SOLN
INTRAVENOUS | Status: AC
  Filled 2022-07-08: qty 250

## 2022-07-08 MED ORDER — TICAGRELOR 90 MG PO TABS
90.0000 mg | ORAL_TABLET | Freq: Two times a day (BID) | ORAL | Status: DC
  Administered 2022-07-09 – 2022-07-10 (×3): 90 mg via ORAL
  Filled 2022-07-08 (×3): qty 1

## 2022-07-08 MED ORDER — SODIUM CHLORIDE 0.9% FLUSH
3.0000 mL | Freq: Two times a day (BID) | INTRAVENOUS | Status: DC
  Administered 2022-07-08 – 2022-07-10 (×5): 3 mL via INTRAVENOUS

## 2022-07-08 MED ORDER — POTASSIUM CHLORIDE 10 MEQ/100ML IV SOLN: 10.0000 meq | Freq: Once | INTRAVENOUS | Status: DC

## 2022-07-08 MED ORDER — HEPARIN SODIUM (PORCINE) 1000 UNIT/ML IJ SOLN
INTRAMUSCULAR | Status: DC | PRN
  Administered 2022-07-08: 3500 [IU] via INTRAVENOUS
  Administered 2022-07-08: 3000 [IU] via INTRAVENOUS
  Administered 2022-07-08 (×2): 2000 [IU] via INTRAVENOUS

## 2022-07-08 MED ORDER — FENTANYL CITRATE (PF) 100 MCG/2ML IJ SOLN
INTRAMUSCULAR | Status: DC | PRN
  Administered 2022-07-08: 25 ug via INTRAVENOUS

## 2022-07-08 MED ORDER — IOHEXOL 300 MG/ML  SOLN
INTRAMUSCULAR | Status: DC | PRN
  Administered 2022-07-08: 155 mL

## 2022-07-08 MED ORDER — NITROGLYCERIN 1 MG/10 ML FOR IR/CATH LAB
INTRA_ARTERIAL | Status: DC | PRN
  Administered 2022-07-08: 100 ug via INTRACORONARY
  Administered 2022-07-08: 200 ug via INTRACORONARY

## 2022-07-08 MED ORDER — HEPARIN (PORCINE) IN NACL 2000-0.9 UNIT/L-% IV SOLN
INTRAVENOUS | Status: DC | PRN
  Administered 2022-07-08: 1000 mL

## 2022-07-08 MED ORDER — ENOXAPARIN SODIUM 40 MG/0.4ML IJ SOSY
40.0000 mg | PREFILLED_SYRINGE | INTRAMUSCULAR | Status: DC
  Administered 2022-07-09 – 2022-07-10 (×2): 40 mg via SUBCUTANEOUS
  Filled 2022-07-08 (×4): qty 0.4

## 2022-07-08 MED ORDER — HEPARIN (PORCINE) IN NACL 1000-0.9 UT/500ML-% IV SOLN
INTRAVENOUS | Status: AC
  Filled 2022-07-08: qty 500

## 2022-07-08 MED ORDER — NITROGLYCERIN IN D5W 200-5 MCG/ML-% IV SOLN
INTRAVENOUS | Status: DC | PRN
  Administered 2022-07-08: 10 ug/min via INTRAVENOUS

## 2022-07-08 MED ORDER — HEPARIN SODIUM (PORCINE) 1000 UNIT/ML IJ SOLN
INTRAMUSCULAR | Status: AC
  Filled 2022-07-08: qty 10

## 2022-07-08 MED ORDER — ONDANSETRON HCL 4 MG/2ML IJ SOLN
INTRAMUSCULAR | Status: DC | PRN
  Administered 2022-07-08: 4 mg via INTRAVENOUS

## 2022-07-08 MED ORDER — LOSARTAN POTASSIUM 50 MG PO TABS: 50.0000 mg | ORAL_TABLET | Freq: Every day | ORAL | Status: DC

## 2022-07-08 MED ORDER — HYDRALAZINE HCL 20 MG/ML IJ SOLN: 10.0000 mg | INTRAMUSCULAR | Status: AC | PRN

## 2022-07-08 MED ORDER — MIDAZOLAM HCL 2 MG/2ML IJ SOLN
INTRAMUSCULAR | Status: DC | PRN
  Administered 2022-07-08: 1 mg via INTRAVENOUS

## 2022-07-08 MED ORDER — ADENOSINE 6 MG/2ML IV SOLN
INTRAVENOUS | Status: AC
  Filled 2022-07-08: qty 2

## 2022-07-08 MED ORDER — LOSARTAN POTASSIUM 50 MG PO TABS
25.0000 mg | ORAL_TABLET | Freq: Every day | ORAL | Status: DC
  Administered 2022-07-09: 25 mg via ORAL
  Filled 2022-07-08 (×3): qty 1

## 2022-07-08 MED ORDER — SODIUM CHLORIDE 0.9 % WEIGHT BASED INFUSION
3.0000 mL/kg/h | INTRAVENOUS | Status: DC
  Administered 2022-07-08: 3 mL/kg/h via INTRAVENOUS

## 2022-07-08 SURGICAL SUPPLY — 21 items
BALLN MINITREK RX 2.0X8 (BALLOONS) ×1
BALLOON MINITREK RX 2.0X8 (BALLOONS) IMPLANT
CATH BALLN WEDGE 5F 110CM (CATHETERS) IMPLANT
CATH INFINITI 5 FR JL3.5 (CATHETERS) IMPLANT
CATH INFINITI JR4 5F (CATHETERS) IMPLANT
CATH VISTA GUIDE 6FR XB3 (CATHETERS) IMPLANT
DEVICE RAD TR BAND REGULAR (VASCULAR PRODUCTS) IMPLANT
DRAPE BRACHIAL (DRAPES) IMPLANT
GLIDESHEATH SLEND SS 6F .021 (SHEATH) IMPLANT
GUIDEWIRE INQWIRE 1.5J.035X260 (WIRE) IMPLANT
INQWIRE 1.5J .035X260CM (WIRE) ×1
KIT ENCORE 26 ADVANTAGE (KITS) IMPLANT
PACK CARDIAC CATH (CUSTOM PROCEDURE TRAY) ×1 IMPLANT
PAD ELECT DEFIB RADIOL ZOLL (MISCELLANEOUS) IMPLANT
PROTECTION STATION PRESSURIZED (MISCELLANEOUS) ×1
SET ATX-X65L (MISCELLANEOUS) IMPLANT
SHEATH GLIDE SLENDER 4/5FR (SHEATH) IMPLANT
STATION PROTECTION PRESSURIZED (MISCELLANEOUS) IMPLANT
STENT ONYX FRONTIER 2.0X18 (Permanent Stent) IMPLANT
WIRE RUNTHROUGH .014X180CM (WIRE) IMPLANT
WIRE RUNTHROUGH IZANAI 014 180 (WIRE) IMPLANT

## 2022-07-08 NOTE — Hospital Course (Signed)
Jessica Wheeler is a 82 y.o. Caucasian female with medical history significant for hypertension, dyslipidemia, glaucoma, type 2 diabetes mellitus, osteoarthritis, aortic dissection and right bundle branch block, who presented to the emergency room with acute onset of midsternal chest pain.  EKG did not show ST elevation.  Troponin elevated, and peaked at 6442.  Patient has been seen by cardiology, heart cath scheduled at 2 PM 3/8.  Patient also placed on heparin drip, aspirin and Lipitor.

## 2022-07-08 NOTE — Progress Notes (Signed)
[facility]  Patient\ present for cardiac catheterization and possible PCI for NSTEMI.  She has agreed to suspend her DNR order and be full code for the duration of the procedure.  She will return to DNR status after the procedure.  Nelva Bush, MD [facility] 07/08/22 3:33 PM

## 2022-07-08 NOTE — H&P (View-Only) (Signed)
Cardiology Consultation   Patient ID: [name] MRN: [id number]; DOB: [birth year]  Admit date: 07/07/2022 Date of Consult: 07/08/2022  PCP:  Jessica Crews, MD   [facility] Cardiologist:  Jessica Rogue, MD        Patient Profile:   Jessica Wheeler is a 82 y.o. female with a hx of coronary artery disease, diabetes, essential hypertension, former smoker, history of aortic dissection, renal artery stenosis, chronic right bundle branch block, history of pancreatitis,chronic hyponatremia, who is being seen 07/08/2022 for the evaluation of NSTEMI at the request of Dr. Sidney Wheeler.  History of Present Illness:   Jessica Wheeler is an 82 year old female with previously mentioned complex past medical history who presented to the [facility] emergency department with complaints of chest pain with associated dizziness and nausea on 07/07/22.  Patient was last seen in clinic 08/2021 by Dr. Rockey Wheeler.  At that time she was doing fairly well without any complaints was considered to be stable from the cardiac standpoint.  There were no medication changes made and no further testing and was needed at that time.  She arrived to the [facility] emergency department 07/07/2022 via EMS from home with complaints of chest tightness that started approximately around 6 PM.  Patient stated that pain was in the center of her chest and radiates into her chin.  She stated that it will decrease in intensity and then start all over again.  She denies any associated symptoms of shortness of breath but did endorse some dizziness and nausea.  She did note that the symptoms that she was feeling last evening were different from previous episodes of chest pain that she has had in the past which was attributed to muscle spasm and electrolyte abnormalities.  Prior to she stated that she had had much lower energy level than usual where she typically walks 2 miles a day but has only been completely exhausted after walking of the first  heel.  Initial vitals: Blood pressure 152/65, pulse of 87, respirations of 19, temperature 97.7  Pertinent labs: Sodium of 127, chloride of 93, CO2 21, blood glucose of 209, high-sensitivity troponin 502, 6442, potassium 3.4  Imaging: Chest x-ray revealed stable mild interstitial prominence favored chronic interstitial lung disease no active disease  Medications administered in the emergency department: Nitrostat 0.4 mg sublingual, and Tylenol 1000 mg, no aspirin was given as patient had taken 243 mg of aspirin earlier in the day, heparin infusion started per pharmacy protocol  Cardiology was consulted for NSTEMI with high-sensitivity troponin peaking at 6442.  Past Medical History:  Diagnosis Date   Allergy    Arthritis    Bundle branch block, right    C7 cervical fracture (HCC)    Diabetes mellitus    Diffuse cystic mastopathy    Dissection, aorta (HCC)    JessicaDew follows   Diverticulosis    Family history of malignant neoplasm of gastrointestinal tract    Glaucoma 2005   Heart murmur    History of bronchitis    History of pancreatitis 2005   Hyperlipidemia    Hypertension 1982   Personal history of tobacco use, presenting hazards to health    RBBB    Ulcer     Past Surgical History:  Procedure Laterality Date   ABDOMINAL HYSTERECTOMY  1991   ANTERIOR CERVICAL CORPECTOMY N/A 03/30/2020   Procedure: CORPECTOMY CERVICAL FOUR - CERVICAL FIVE;  Surgeon: Jessica Pies, MD;  Location: [facility];  Service: Neurosurgery;  Laterality: N/A;  anterior   [place]   for lumbar ruptured disc. No hardware, no discectomy, no fusion   BLADDER SURGERY     BREAST BIOPSY Right 1980's   benign   BREAST EXCISIONAL BIOPSY Right 1980s   benign   BREAST SURGERY     biopsy   CARDIAC CATHETERIZATION     Dr. Ubaldo Wheeler, few years ago   CATARACT EXTRACTION W/PHACO Left 08/31/2015   Procedure: CATARACT EXTRACTION PHACO AND INTRAOCULAR LENS PLACEMENT ([place]);  Surgeon: Jessica Freshwater, MD;  Location: [facility];  Service: Ophthalmology;  Laterality: Left;  DIABETIC - oral meds   CATARACT EXTRACTION W/PHACO Right 10/12/2015   Procedure: CATARACT EXTRACTION PHACO AND INTRAOCULAR LENS PLACEMENT ([place]) right eye;  Surgeon: Jessica Freshwater, MD;  Location: [facility];  Service: Ophthalmology;  Laterality: Right;  DIABETIC - oral meds   CHOLECYSTECTOMY  1994   COLONOSCOPY  2008,02/22/2012   Jessica Wheeler-2013   COLONOSCOPY WITH PROPOFOL N/A 04/04/2017   Procedure: COLONOSCOPY WITH PROPOFOL;  Surgeon: Jessica Lye, MD;  Location: [facility] ENDOSCOPY;  Service: Endoscopy;  Laterality: N/A;   POSTERIOR CERVICAL FUSION/FORAMINOTOMY N/A 04/01/2020   Procedure: POSTERIOR CERVICAL FUSION/FORAMINOTOMY CERVICAL FOUR- CERVICAL SIX;  Surgeon: Jessica Pies, MD;  Location: [place];  Service: Neurosurgery;  Laterality: N/A;   REFRACTIVE SURGERY       Home Medications:  Prior to Admission medications   Medication Sig Start Date End Date Taking? Authorizing Provider  Accu-Chek FastClix Lancets MISC TEST FASTING BLOOD SUGAR EVERY DAY 05/17/22  Yes Wheeler, Jessica Bucy, MD  amLODipine (NORVASC) 5 MG tablet TAKE 1 TABLET EVERY DAY 12/24/21  Yes Wheeler, Jessica Bucy, MD  aspirin 81 MG EC tablet Take 81 mg by mouth daily.   Yes [provider]  Blood Glucose Monitoring Suppl (ACCU-CHEK GUIDE) w/Device KIT USE AS DIRECTED TO CHECK FASTING BLOOD SUGAR DAILY 12/11/20  Yes Wheeler, Jessica Bucy, MD  glucose blood (ACCU-CHEK GUIDE) test strip TEST FASTING BLOOD SUGAR EVERY DAY AS DIRECTED 07/20/21  Yes Wheeler, Jessica Bucy, MD  hydrochlorothiazide (HYDRODIURIL) 25 MG tablet TAKE 1 TABLET EVERY DAY 05/18/22  Yes Wheeler, Jessica Bucy, MD  ibuprofen (ADVIL) 200 MG tablet Take 200 mg by mouth daily at 6 (six) AM.   Yes [provider]  Lancets Misc. (ACCU-CHEK FASTCLIX LANCET) KIT 1 each by Does not apply route as directed. 06/04/20  Yes Wheeler,  Jessica Bucy, MD  latanoprost (XALATAN) 0.005 % ophthalmic solution Place 1 drop into both eyes at bedtime.  04/09/15  Yes [provider]  losartan (COZAAR) 100 MG tablet Take 1 tablet (100 mg total) by mouth daily. 01/20/22  Yes Tally Joe T, FNP  Magnesium 250 MG TABS Take 250 mg by mouth daily.   Yes [provider]  metFORMIN (GLUCOPHAGE) 500 MG tablet TAKE 2 TABLETS TWICE DAILY WITH MEALS 05/06/22  Yes Wheeler, Jessica Bucy, MD  vitamin B-12 (CYANOCOBALAMIN) 1000 MCG tablet Take 1,000 mcg by mouth daily.   Yes [provider]  cyclobenzaprine (FLEXERIL) 5 MG tablet Take ONE tab PO BID/TID PRN    [provider]    Inpatient Medications: Scheduled Meds:  amLODipine  5 mg Oral Daily   aspirin EC  81 mg Oral Daily   atorvastatin  80 mg Oral QHS   cyanocobalamin  1,000 mcg Oral Daily   hydrochlorothiazide  25 mg Oral Daily   insulin aspart  0-5 Units Subcutaneous QHS   insulin aspart  0-9 Units Subcutaneous TID WC   latanoprost  1 drop Both Eyes QHS   losartan  100 mg Oral Daily   magnesium oxide  200 mg Oral Daily   Continuous Infusions:  sodium chloride 100 mL/hr at 07/07/22 2342   heparin 800 Units/hr (07/08/22 0709)   PRN Meds: acetaminophen, ALPRAZolam, cyclobenzaprine, magnesium hydroxide, morphine injection, nitroGLYCERIN, ondansetron (ZOFRAN) IV, traZODone  Allergies:    Allergies  Allergen Reactions   Beta Adrenergic Blockers     Chest pressure/difficulty breathing   Jardiance [Empagliflozin]     Muscle cramps   Levaquin [Levofloxacin In D5w]     Low blood sugar; does take this when she needs it. She just makes sure to control her BS to avoid hypoglycemia.   Lisinopril Cough   Zetia [Ezetimibe]     Lethargic    Prevnar [Pneumococcal 13-Val Conj Vacc] Rash    Joint Pain   Sulfa Antibiotics Rash    "makes my skin raw"    Social History:   Social History   Socioeconomic History   Marital status: Widowed    Spouse name: Not on  file   Number of children: 2   Years of education: 14   Highest education level: Associate degree: academic program  Occupational History   Occupation: Retired  Tobacco Use   Smoking status: Former    Packs/day: 1.00    Years: 15.00    Total pack years: 15.00    Types: Cigarettes    Quit date: 08/03/2003    Years since quitting: 18.9   Smokeless tobacco: Never  Vaping Use   Vaping Use: Never used  Substance and Sexual Activity   Alcohol use: No   Drug use: No   Sexual activity: Not on file  Other Topics Concern   Not on file  Social History Narrative   Not on file   Social Determinants of Health   Financial Resource Strain: Low Risk  (07/14/2021)   Overall Financial Resource Strain (CARDIA)    Difficulty of Paying Living Expenses: Not hard at all  Food Insecurity: No Food Insecurity (07/14/2021)   Hunger Vital Sign    Worried About Running Out of Food in the Last Year: Never true    Hale in the Last Year: Never true  Transportation Needs: No Transportation Needs (07/14/2021)   PRAPARE - Hydrologist (Medical): No    Lack of Transportation (Non-Medical): No  Physical Activity: Sufficiently Active (07/14/2021)   Exercise Vital Sign    Days of Exercise per Week: 4 days    Minutes of Exercise per Session: 40 min  Stress: No Stress Concern Present (07/14/2021)   [place]    Feeling of Stress : Only a little  Social Connections: Moderately Isolated (07/14/2021)   Social Connection and Isolation Panel [NHANES]    Frequency of Communication with Friends and Family: More than three times a week    Frequency of Social Gatherings with Friends and Family: Once a week    Attends Religious Services: More than 4 times per year    Active Member of [employer] or Organizations: No    Attends Archivist Meetings: Never    Marital Status: Widowed  Intimate Partner Violence: Not At  Risk (07/14/2021)   Humiliation, Afraid, Rape, and Kick questionnaire    Fear of Current or Ex-Partner: No    Emotionally Abused: No    Physically Abused: No  Sexually Abused: No    Family History:    Family History  Problem Relation Age of Onset   Diabetes Mother    Hypertension Mother    Anxiety disorder Mother    Heart disease Father        rheumatic heart disease   Heart attack Father    Rheum arthritis Father    Colon cancer Brother    Hypertension Maternal Grandmother    Hypertension Maternal Grandfather    Breast cancer Maternal Aunt 60   Breast cancer Cousin 21     ROS:  Please see the history of present illness.  Review of Systems  Constitutional:  Positive for malaise/fatigue.  Cardiovascular:  Positive for chest pain.  Gastrointestinal:  Positive for nausea.  Neurological:  Positive for dizziness.    All other ROS reviewed and negative.     Physical Exam/Data:   Vitals:   07/07/22 2343 07/08/22 0130 07/08/22 0300 07/08/22 0605  BP: 126/64 128/66 126/60 (!) 143/68  Pulse: 77 83 80 88  Resp: 18 (!) '22 17 17  '$ Temp: 97.7 F (36.5 C)   98 F (36.7 C)  TempSrc: Oral   Oral  SpO2: 98% 98% 97% 94%  Weight:      Height:       No intake or output data in the 24 hours ending 07/08/22 0752    07/07/2022    7:53 PM 05/27/2022    1:19 PM 03/08/2022   10:20 AM  Last 3 Weights  Weight (lbs) 141 lb 140 lb 6.4 oz 137 lb 4.8 oz  Weight (kg) 63.957 kg 63.685 kg 62.279 kg     Body mass index is 24.98 kg/m.  General:  Well nourished, well developed, in no acute distress HEENT: normal Neck: no JVD Vascular: No carotid bruits; Distal pulses 2+ bilaterally Cardiac:  normal S1, S2; RRR; no murmur  Lungs:  clear to auscultation bilaterally, no wheezing, rhonchi or rales  Abd: soft, nontender, no hepatomegaly  Ext: no edema Musculoskeletal:  No deformities, BUE and BLE strength normal and equal Skin: warm and dry  Neuro:  CNs 2-12 intact, no focal abnormalities  noted Psych:  Normal affect   EKG:  The EKG was personally reviewed and demonstrates:  sinus rate of 88 with RBBB Telemetry:  Telemetry was personally reviewed and demonstrates:  sinus tach 105, RBBB  Relevant CV Studies: Echocardiogram ordered and pending  Laboratory Data:  High Sensitivity Troponin:   Recent Labs  Lab 07/07/22 1959 07/07/22 2158  TROPONINIHS 502* 6,442*     Chemistry Recent Labs  Lab 07/07/22 1959 07/08/22 0536  NA 127* 127*  K 3.5 3.4*  CL 93* 94*  CO2 21* 21*  GLUCOSE 209* 161*  BUN 22 16  CREATININE 0.69 0.56  CALCIUM 9.2 9.0  GFRNONAA >60 >60  ANIONGAP 13 12    Recent Labs  Lab 07/07/22 1959  PROT 7.1  ALBUMIN 4.1  AST 32  ALT 25  ALKPHOS 46  BILITOT 0.8   Lipids  Recent Labs  Lab 07/08/22 0536  CHOL 197  TRIG 62  HDL 103  LDLCALC 82  CHOLHDL 1.9    Hematology Recent Labs  Lab 07/07/22 1959 07/08/22 0536  WBC 6.2 8.8  RBC 4.18 4.10  HGB 13.0 12.8  HCT 37.2 36.1  MCV 89.0 88.0  MCH 31.1 31.2  MCHC 34.9 35.5  RDW 13.2 13.1  PLT 240 220   Thyroid No results for input(s): "TSH", "FREET4" in the last 168 hours.  BNPNo results for input(s): "BNP", "PROBNP" in the last 168 hours.  DDimer No results for input(s): "DDIMER" in the last 168 hours.   Radiology/Studies:  DG Chest 1 View  Result Date: 07/07/2022 CLINICAL DATA:  Chest pain EXAM: CHEST  1 VIEW COMPARISON:  04/20/2021 FINDINGS: Heart and mediastinal contours within normal limits. Continued mild diffuse interstitial prominence, similar prior study. No acute confluent opacities or effusions. No acute bony abnormality. IMPRESSION: Stable mild interstitial prominence, favor chronic interstitial lung disease. No active disease. Electronically Signed   By: Rolm Baptise M.D.   On: 07/07/2022 20:17     Assessment and Plan:   NSTEMI with history of coronary artery disease -Arrived with complaints of chest pain -Chest discomfort and continued throughout the night and 1  chest discomfort and continued throughout the night and 1-2/10 -Continued on heparin infusion -Continue trending high-sensitivity troponins second troponin 6442 -echocardiogram ordered and pending -continue on asa -remain NPO -will schedule for LHC, recommendations to follow  Hypertension -Blood pressure 143/68 -Continued on amlodipine and losartan and HCTZ -Vital signs per unit protocol  Hyperlipidemia -LDL 82 -Goal is 70 or less -started on atorvastatin 80 mg last evening -Patient has a long standing history of statin intolerance as well as Zetia, consider PCSK9i  Hyponatremia -Serum sodium 127 -Patient with chronic hyponatremia -Daily BMP -Limit free water  Type 2 diabetes -Continue on insulin -PTA metformin remains on hold -Management per IM   Risk Assessment/Risk Scores:     TIMI Risk Score for Unstable Angina or Non-ST Elevation MI:   The patient's TIMI risk score is 5, which indicates a 26% risk of all cause mortality, new or recurrent myocardial infarction or need for urgent revascularization in the next 14 days.          For questions or updates, please contact [facility] Please consult [email or web address] for contact info under    Signed, Jasraj Lappe, NP  07/08/2022 7:52 AM

## 2022-07-08 NOTE — Interval H&P Note (Signed)
History and Physical Interval Note:  07/08/2022 3:28 PM  Jessica Wheeler  has presented today for surgery, with the diagnosis of non ST elevation myocardial infarction.  The various methods of treatment have been discussed with the patient and family. After consideration of risks, benefits and other options for treatment, the patient has consented to  Procedure(s): LEFT HEART CATH AND CORONARY ANGIOGRAPHY (N/A) as a surgical intervention.  The patient's history has been reviewed, patient examined, no change in status, stable for surgery.  I have reviewed the patient's chart and labs.  Questions were answered to the patient's satisfaction.    Cath Lab Visit (complete for each Cath Lab visit)  Clinical Evaluation Leading to the Procedure:   ACS: Yes.    Non-ACS:  N/A  Andrue Dini

## 2022-07-08 NOTE — Consult Note (Signed)
Cardiology Consultation   Patient ID: ROCHELL EWBANK MRN: [id number]; DOB: 31-Jan-1941  Admit date: 07/07/2022 Date of Consult: 07/08/2022  PCP:  Virginia Crews, MD   [place] [facility] Cardiologist:  Ida Rogue, MD        Patient Profile:   Jessica Wheeler is a 82 y.o. female with a hx of coronary artery disease, diabetes, essential hypertension, former smoker, history of aortic dissection, renal artery stenosis, chronic right bundle branch block, history of pancreatitis,chronic hyponatremia, who is being seen 07/08/2022 for the evaluation of NSTEMI at the request of Dr. Sidney Ace.  History of Present Illness:   [name] is an 82 year old female with previously mentioned complex past medical history who presented to the [facility] emergency department with complaints of chest pain with associated dizziness and nausea on 07/07/22.  Patient was last seen in clinic 08/2021 by Dr. Rockey Situ.  At that time she was doing fairly well without any complaints was considered to be stable from the cardiac standpoint.  There were no medication changes made and no further testing and was needed at that time.  She arrived to the [facility] emergency department 07/07/2022 via EMS from home with complaints of chest tightness that started approximately around 6 PM.  Patient stated that pain was in the center of her chest and radiates into her chin.  She stated that it will decrease in intensity and then start all over again.  She denies any associated symptoms of shortness of breath but did endorse some dizziness and nausea.  She did note that the symptoms that she was feeling last evening were different from previous episodes of chest pain that she has had in the past which was attributed to muscle spasm and electrolyte abnormalities.  Prior to she stated that she had had much lower energy level than usual where she typically walks 2 miles a day but has only been completely exhausted after walking of the first  heel.  Initial vitals: Blood pressure 152/65, pulse of 87, respirations of 19, temperature 97.7  Pertinent labs: Sodium of 127, chloride of 93, CO2 21, blood glucose of 209, high-sensitivity troponin 502, 6442, potassium 3.4  Imaging: Chest x-ray revealed stable mild interstitial prominence favored chronic interstitial lung disease no active disease  Medications administered in the emergency department: Nitrostat 0.4 mg sublingual, and Tylenol 1000 mg, no aspirin was given as patient had taken 243 mg of aspirin earlier in the day, heparin infusion started per pharmacy protocol  Cardiology was consulted for NSTEMI with high-sensitivity troponin peaking at 6442.  Past Medical History:  Diagnosis Date   Allergy    Arthritis    Bundle branch block, right    C7 cervical fracture (HCC)    Diabetes mellitus    Diffuse cystic mastopathy    Dissection, aorta (HCC)    Dr.Dew follows   Diverticulosis    Family history of malignant neoplasm of gastrointestinal tract    Glaucoma 2005   Heart murmur    History of bronchitis    History of pancreatitis 2005   Hyperlipidemia    Hypertension 1982   Personal history of tobacco use, presenting hazards to health    RBBB    Ulcer     Past Surgical History:  Procedure Laterality Date   ABDOMINAL HYSTERECTOMY  1991   ANTERIOR CERVICAL CORPECTOMY N/A 03/30/2020   Procedure: CORPECTOMY CERVICAL FOUR - CERVICAL FIVE;  Surgeon: Newman Pies, MD;  Location: [place];  Service: Neurosurgery;  Laterality: N/A;  anterior   [facility]   for lumbar ruptured disc. No hardware, no discectomy, no fusion   BLADDER SURGERY     BREAST BIOPSY Right 1980's   benign   BREAST EXCISIONAL BIOPSY Right 1980s   benign   BREAST SURGERY     biopsy   CARDIAC CATHETERIZATION     Dr. Ubaldo Glassing, few years ago   CATARACT EXTRACTION W/PHACO Left 08/31/2015   Procedure: CATARACT EXTRACTION PHACO AND INTRAOCULAR LENS PLACEMENT ([place]);  Surgeon: Ronnell Freshwater, MD;  Location: [place];  Service: Ophthalmology;  Laterality: Left;  DIABETIC - oral meds   CATARACT EXTRACTION W/PHACO Right 10/12/2015   Procedure: CATARACT EXTRACTION PHACO AND INTRAOCULAR LENS PLACEMENT ([place]) right eye;  Surgeon: Ronnell Freshwater, MD;  Location: [place];  Service: Ophthalmology;  Laterality: Right;  DIABETIC - oral meds   CHOLECYSTECTOMY  1994   COLONOSCOPY  2008,02/22/2012   Dr. Sankar-2013   COLONOSCOPY WITH PROPOFOL N/A 04/04/2017   Procedure: COLONOSCOPY WITH PROPOFOL;  Surgeon: Christene Lye, MD;  Location: [facility] ENDOSCOPY;  Service: Endoscopy;  Laterality: N/A;   POSTERIOR CERVICAL FUSION/FORAMINOTOMY N/A 04/01/2020   Procedure: POSTERIOR CERVICAL FUSION/FORAMINOTOMY CERVICAL FOUR- CERVICAL SIX;  Surgeon: Newman Pies, MD;  Location: [place];  Service: Neurosurgery;  Laterality: N/A;   REFRACTIVE SURGERY       Home Medications:  Prior to Admission medications   Medication Sig Start Date End Date Taking? Authorizing Provider  Accu-Chek FastClix Lancets MISC TEST FASTING BLOOD SUGAR EVERY DAY 05/17/22  Yes Bacigalupo, [name], MD  amLODipine (NORVASC) 5 MG tablet TAKE 1 TABLET EVERY DAY 12/24/21  Yes Bacigalupo, [name], MD  aspirin 81 MG EC tablet Take 81 mg by mouth daily.   Yes [provider]  Blood Glucose Monitoring Suppl (ACCU-CHEK GUIDE) w/Device KIT USE AS DIRECTED TO CHECK FASTING BLOOD SUGAR DAILY 12/11/20  Yes Bacigalupo, [name], MD  glucose blood (ACCU-CHEK GUIDE) test strip TEST FASTING BLOOD SUGAR EVERY DAY AS DIRECTED 07/20/21  Yes Bacigalupo, [name], MD  hydrochlorothiazide (HYDRODIURIL) 25 MG tablet TAKE 1 TABLET EVERY DAY 05/18/22  Yes Bacigalupo, [name], MD  ibuprofen (ADVIL) 200 MG tablet Take 200 mg by mouth daily at 6 (six) AM.   Yes [provider]  Lancets Misc. (ACCU-CHEK FASTCLIX LANCET) KIT 1 each by Does not apply route as directed. 06/04/20  Yes Bacigalupo,  [name], MD  latanoprost (XALATAN) 0.005 % ophthalmic solution Place 1 drop into both eyes at bedtime.  04/09/15  Yes [provider]  losartan (COZAAR) 100 MG tablet Take 1 tablet (100 mg total) by mouth daily. 01/20/22  Yes Tally Joe T, FNP  Magnesium 250 MG TABS Take 250 mg by mouth daily.   Yes [provider]  metFORMIN (GLUCOPHAGE) 500 MG tablet TAKE 2 TABLETS TWICE DAILY WITH MEALS 05/06/22  Yes Bacigalupo, [name], MD  vitamin B-12 (CYANOCOBALAMIN) 1000 MCG tablet Take 1,000 mcg by mouth daily.   Yes [provider]  cyclobenzaprine (FLEXERIL) 5 MG tablet Take ONE tab PO BID/TID PRN    [provider]    Inpatient Medications: Scheduled Meds:  amLODipine  5 mg Oral Daily   aspirin EC  81 mg Oral Daily   atorvastatin  80 mg Oral QHS   cyanocobalamin  1,000 mcg Oral Daily   hydrochlorothiazide  25 mg Oral Daily   insulin aspart  0-5 Units Subcutaneous QHS   insulin aspart  0-9 Units Subcutaneous TID WC   latanoprost  1 drop Both Eyes QHS   losartan  100 mg Oral Daily   magnesium oxide  200 mg Oral Daily   Continuous Infusions:  sodium chloride 100 mL/hr at 07/07/22 2342   heparin 800 Units/hr (07/08/22 0709)   PRN Meds: acetaminophen, ALPRAZolam, cyclobenzaprine, magnesium hydroxide, morphine injection, nitroGLYCERIN, ondansetron (ZOFRAN) IV, traZODone  Allergies:    Allergies  Allergen Reactions   Beta Adrenergic Blockers     Chest pressure/difficulty breathing   Jardiance [Empagliflozin]     Muscle cramps   Levaquin [Levofloxacin In D5w]     Low blood sugar; does take this when she needs it. She just makes sure to control her BS to avoid hypoglycemia.   Lisinopril Cough   Zetia [Ezetimibe]     Lethargic    Prevnar [Pneumococcal 13-Val Conj Vacc] Rash    Joint Pain   Sulfa Antibiotics Rash    "makes my skin raw"    Social History:   Social History   Socioeconomic History   Marital status: Widowed    Spouse name: Not on  file   Number of children: 2   Years of education: 14   Highest education level: Associate degree: academic program  Occupational History   Occupation: Retired  Tobacco Use   Smoking status: Former    Packs/day: 1.00    Years: 15.00    Total pack years: 15.00    Types: Cigarettes    Quit date: 08/03/2003    Years since quitting: 18.9   Smokeless tobacco: Never  Vaping Use   Vaping Use: Never used  Substance and Sexual Activity   Alcohol use: No   Drug use: No   Sexual activity: Not on file  Other Topics Concern   Not on file  Social History Narrative   Not on file   Social Determinants of Health   Financial Resource Strain: Low Risk  (07/14/2021)   Overall Financial Resource Strain (CARDIA)    Difficulty of Paying Living Expenses: Not hard at all  Food Insecurity: No Food Insecurity (07/14/2021)   Hunger Vital Sign    Worried About Running Out of Food in the Last Year: Never true    [place] in the Last Year: Never true  Transportation Needs: No Transportation Needs (07/14/2021)   PRAPARE - Hydrologist (Medical): No    Lack of Transportation (Non-Medical): No  Physical Activity: Sufficiently Active (07/14/2021)   Exercise Vital Sign    Days of Exercise per Week: 4 days    Minutes of Exercise per Session: 40 min  Stress: No Stress Concern Present (07/14/2021)   [place]    Feeling of Stress : Only a little  Social Connections: Moderately Isolated (07/14/2021)   Social Connection and Isolation Panel [NHANES]    Frequency of Communication with Friends and Family: More than three times a week    Frequency of Social Gatherings with Friends and Family: Once a week    Attends Religious Services: More than 4 times per year    Active Member of [employer] or Organizations: No    Attends Archivist Meetings: Never    Marital Status: Widowed  Intimate Partner Violence: Not At  Risk (07/14/2021)   Humiliation, Afraid, Rape, and Kick questionnaire    Fear of Current or Ex-Partner: No    Emotionally Abused: No    Physically Abused: No  Sexually Abused: No    Family History:    Family History  Problem Relation Age of Onset   Diabetes Mother    Hypertension Mother    Anxiety disorder Mother    Heart disease Father        rheumatic heart disease   Heart attack Father    Rheum arthritis Father    Colon cancer Brother    Hypertension Maternal Grandmother    Hypertension Maternal Grandfather    Breast cancer Maternal Aunt 60   Breast cancer Cousin 62     ROS:  Please see the history of present illness.  Review of Systems  Constitutional:  Positive for malaise/fatigue.  Cardiovascular:  Positive for chest pain.  Gastrointestinal:  Positive for nausea.  Neurological:  Positive for dizziness.    All other ROS reviewed and negative.     Physical Exam/Data:   Vitals:   07/07/22 2343 07/08/22 0130 07/08/22 0300 07/08/22 0605  BP: 126/64 128/66 126/60 (!) 143/68  Pulse: 77 83 80 88  Resp: 18 (!) '22 17 17  '$ Temp: 97.7 F (36.5 C)   98 F (36.7 C)  TempSrc: Oral   Oral  SpO2: 98% 98% 97% 94%  Weight:      Height:       No intake or output data in the 24 hours ending 07/08/22 0752    07/07/2022    7:53 PM 05/27/2022    1:19 PM 03/08/2022   10:20 AM  Last 3 Weights  Weight (lbs) 141 lb 140 lb 6.4 oz 137 lb 4.8 oz  Weight (kg) 63.957 kg 63.685 kg 62.279 kg     Body mass index is 24.98 kg/m.  General:  Well nourished, well developed, in no acute distress HEENT: normal Neck: no JVD Vascular: No carotid bruits; Distal pulses 2+ bilaterally Cardiac:  normal S1, S2; RRR; no murmur  Lungs:  clear to auscultation bilaterally, no wheezing, rhonchi or rales  Abd: soft, nontender, no hepatomegaly  Ext: no edema Musculoskeletal:  No deformities, BUE and BLE strength normal and equal Skin: warm and dry  Neuro:  CNs 2-12 intact, no focal abnormalities  noted Psych:  Normal affect   EKG:  The EKG was personally reviewed and demonstrates:  sinus rate of 88 with RBBB Telemetry:  Telemetry was personally reviewed and demonstrates:  sinus tach 105, RBBB  Relevant CV Studies: Echocardiogram ordered and pending  Laboratory Data:  High Sensitivity Troponin:   Recent Labs  Lab 07/07/22 1959 07/07/22 2158  TROPONINIHS 502* 6,442*     Chemistry Recent Labs  Lab 07/07/22 1959 07/08/22 0536  NA 127* 127*  K 3.5 3.4*  CL 93* 94*  CO2 21* 21*  GLUCOSE 209* 161*  BUN 22 16  CREATININE 0.69 0.56  CALCIUM 9.2 9.0  GFRNONAA >60 >60  ANIONGAP 13 12    Recent Labs  Lab 07/07/22 1959  PROT 7.1  ALBUMIN 4.1  AST 32  ALT 25  ALKPHOS 46  BILITOT 0.8   Lipids  Recent Labs  Lab 07/08/22 0536  CHOL 197  TRIG 62  HDL 103  LDLCALC 82  CHOLHDL 1.9    Hematology Recent Labs  Lab 07/07/22 1959 07/08/22 0536  WBC 6.2 8.8  RBC 4.18 4.10  HGB 13.0 12.8  HCT 37.2 36.1  MCV 89.0 88.0  MCH 31.1 31.2  MCHC 34.9 35.5  RDW 13.2 13.1  PLT 240 220   Thyroid No results for input(s): "TSH", "FREET4" in the last 168 hours.  BNPNo results for input(s): "BNP", "PROBNP" in the last 168 hours.  DDimer No results for input(s): "DDIMER" in the last 168 hours.   Radiology/Studies:  DG Chest 1 View  Result Date: 07/07/2022 CLINICAL DATA:  Chest pain EXAM: CHEST  1 VIEW COMPARISON:  04/20/2021 FINDINGS: Heart and mediastinal contours within normal limits. Continued mild diffuse interstitial prominence, similar prior study. No acute confluent opacities or effusions. No acute bony abnormality. IMPRESSION: Stable mild interstitial prominence, favor chronic interstitial lung disease. No active disease. Electronically Signed   By: Rolm Baptise M.D.   On: 07/07/2022 20:17     Assessment and Plan:   NSTEMI with history of coronary artery disease -Arrived with complaints of chest pain -Chest discomfort and continued throughout the night and 1  chest discomfort and continued throughout the night and 1-2/10 -Continued on heparin infusion -Continue trending high-sensitivity troponins second troponin 6442 -echocardiogram ordered and pending -continue on asa -remain NPO -will schedule for LHC, recommendations to follow  Hypertension -Blood pressure 143/68 -Continued on amlodipine and losartan and HCTZ -Vital signs per unit protocol  Hyperlipidemia -LDL 82 -Goal is 70 or less -started on atorvastatin 80 mg last evening -Patient has a long standing history of statin intolerance as well as Zetia, consider PCSK9i  Hyponatremia -Serum sodium 127 -Patient with chronic hyponatremia -Daily BMP -Limit free water  Type 2 diabetes -Continue on insulin -PTA metformin remains on hold -Management per IM   Risk Assessment/Risk Scores:     TIMI Risk Score for Unstable Angina or Non-ST Elevation MI:   The patient's TIMI risk score is 5, which indicates a 26% risk of all cause mortality, new or recurrent myocardial infarction or need for urgent revascularization in the next 14 days.          For questions or updates, please contact [place] Please consult [email or web address] for contact info under    Signed, Galen Russman, NP  07/08/2022 7:52 AM

## 2022-07-08 NOTE — Progress Notes (Signed)
  Progress Note   Patient: Jessica Wheeler [id number] DOB: 05-Mar-1941 DOA: 07/07/2022     1 DOS: the patient was seen and examined on 07/08/2022   Brief hospital course: DEASIAH HAGBERG is a 82 y.o. Caucasian female with medical history significant for hypertension, dyslipidemia, glaucoma, type 2 diabetes mellitus, osteoarthritis, aortic dissection and right bundle branch block, who presented to the emergency room with acute onset of midsternal chest pain.  EKG did not show ST elevation.  Troponin elevated, and peaked at 6442.  Patient has been seen by cardiology, heart cath scheduled at 2 PM 3/8.  Patient also placed on heparin drip, aspirin and Lipitor.   Principal Problem:   NSTEMI (non-ST elevated myocardial infarction) ([place]) Active Problems:   Hyponatremia   Essential hypertension   Type 2 diabetes mellitus without complications (HCC)   Glaucoma   Renal artery stenosis (HCC)   Hypokalemia   Assessment and Plan: * NSTEMI (non-ST elevated myocardial infarction) (HCC) Continue heparin drip, also received high-dose aspirin and Lipitor.  Has been seen by cardiology, cardiac cath scheduled for today.  Will monitor closely.  Hyponatremia Hypokalemia. This appears to be secondary to HCTZ which will be discontinued. Patient currently n.p.o. pending procedure.  Will give IV potassium.  Type 2 diabetes mellitus without complications ([place]) Hold off metformin, continue sliding scale insulin.  Follow glucose.  Essential hypertension Continue to follow.        Subjective:  Patient complaining of back pain and neck pain which is chronic in nature.  Chest pain is better.  No shortness of breath.  Physical Exam: Vitals:   07/08/22 0900 07/08/22 1000 07/08/22 1003 07/08/22 1100  BP: (!) 167/88 (!) 161/84  (!) 155/77  Pulse: (!) 101 (!) 103  (!) 102  Resp:  18  (!) 21  Temp:   98 F (36.7 C)   TempSrc:   Oral   SpO2: 95% 93%  96%  Weight:      Height:       General exam:  Appears calm and comfortable  Respiratory system: Clear to auscultation. Respiratory effort normal. Cardiovascular system: Regular and mildly tachycardic. No JVD, murmurs, rubs, gallops or clicks. No pedal edema. Gastrointestinal system: Abdomen is nondistended, soft and nontender. No organomegaly or masses felt. Normal bowel sounds heard. Central nervous system: Alert and oriented. No focal neurological deficits. Extremities: Symmetric 5 x 5 power. Skin: No rashes, lesions or ulcers Psychiatry: Judgement and insight appear normal. Mood & affect appropriate.    Data Reviewed:  Review lab results.  Family Communication: Daughter-in-law at bedside updated.  Disposition: Status is: Inpatient Remains inpatient appropriate because: Disease severity, IV treatment.  Inpatient procedure.     Time spent: 35 minutes  Author: Sharen Hones, MD 07/08/2022 12:51 PM  For on call review [email or web address].

## 2022-07-08 NOTE — ED Notes (Signed)
Pt disconnected from monitor to walk to restroom.  Pt ambulated to toilet in room without difficulty.  Pt denies any needs at this time.

## 2022-07-08 NOTE — Consult Note (Signed)
[place] for heparin infusion Indication: chest pain/ACS  Allergies  Allergen Reactions   Beta Adrenergic Blockers     Chest pressure/difficulty breathing   Jardiance [Empagliflozin]     Muscle cramps   Levaquin [Levofloxacin In D5w]     Low blood sugar; does take this when she needs it. She just makes sure to control her BS to avoid hypoglycemia.   Lisinopril Cough   Zetia [Ezetimibe]     Lethargic    Prevnar [Pneumococcal 13-Val Conj Vacc] Rash    Joint Pain   Sulfa Antibiotics Rash    "makes my skin raw"    Patient Measurements: Height: '5\' 3"'$  (160 cm) Weight: 64 kg (141 lb) IBW/kg (Calculated) : 52.4 Heparin Dosing Weight: 64 kg  Vital Signs: Temp: 98 F (36.7 C) (03/08 0605) Temp Source: Oral (03/08 0605) BP: 143/68 (03/08 0605) Pulse Rate: 88 (03/08 0605)  Labs: Recent Labs    07/07/22 1959 07/07/22 2158 07/08/22 0536  HGB 13.0  --  12.8  HCT 37.2  --  36.1  PLT 240  --  220  APTT  --  25  --   LABPROT  --   --  13.1  INR  --   --  1.0  HEPARINUNFRC  --   --  0.41  CREATININE 0.69  --  0.56  TROPONINIHS 502* 6,442*  --      Estimated Creatinine Clearance: 47.9 mL/min (by C-G formula based on SCr of 0.56 mg/dL).   Medical History: Past Medical History:  Diagnosis Date   Allergy    Arthritis    Bundle branch block, right    C7 cervical fracture (HCC)    Diabetes mellitus    Diffuse cystic mastopathy    Dissection, aorta (HCC)    Dr.Dew follows   Diverticulosis    Family history of malignant neoplasm of gastrointestinal tract    Glaucoma 2005   Heart murmur    History of bronchitis    History of pancreatitis 2005   Hyperlipidemia    Hypertension 1982   Personal history of tobacco use, presenting hazards to health    RBBB    Ulcer     Medications:  PTA: N/A Inpatient: Heparin infusion (3/7 >>>) Allergies: No AC/APT related allergies  Assessment: 82 year old female with PMH HTN, DM, presents with  chest pain radiating to chin. Pharmacy consulted for management of heparin infusion in the setting of NSTEMI.  Goal of Therapy:  Heparin level 0.3-0.7 units/ml Monitor platelets by anticoagulation protocol: Yes   3/08 0536 HL 0.41, therapeutic x 1  Plan:  Continue heparin infusion at 800 units/hr Recheck HL in 8 hr to confirm Continue to monitor H&H and platelets daily while on heparin gtt.  Renda Rolls, PharmD, [facility] 07/08/2022 6:34 AM

## 2022-07-09 ENCOUNTER — Other Ambulatory Visit: Payer: Self-pay

## 2022-07-09 ENCOUNTER — Inpatient Hospital Stay (HOSPITAL_COMMUNITY)
Admit: 2022-07-09 | Discharge: 2022-07-09 | Disposition: A | Discharge status: Home / Self Care | Payer: Medicare HMO | Attending: Internal Medicine | Admitting: Internal Medicine

## 2022-07-09 DIAGNOSIS — I214 Non-ST elevation (NSTEMI) myocardial infarction: Secondary | ICD-10-CM | POA: Diagnosis not present

## 2022-07-09 DIAGNOSIS — E876 Hypokalemia: Secondary | ICD-10-CM

## 2022-07-09 DIAGNOSIS — E118 Type 2 diabetes mellitus with unspecified complications: Secondary | ICD-10-CM

## 2022-07-09 DIAGNOSIS — E871 Hypo-osmolality and hyponatremia: Secondary | ICD-10-CM | POA: Diagnosis not present

## 2022-07-09 LAB — BASIC METABOLIC PANEL
Anion gap: 10 (ref 5–15)
BUN: 13 mg/dL (ref 8–23)
CO2: 23 mmol/L (ref 22–32)
Calcium: 8.6 mg/dL — ABNORMAL LOW (ref 8.9–10.3)
Chloride: 94 mmol/L — ABNORMAL LOW (ref 98–111)
Creatinine, Ser: 0.69 mg/dL (ref 0.44–1.00)
GFR, Estimated: >60 mL/min (ref >60)
Glucose, Bld: 151 mg/dL — ABNORMAL HIGH (ref 70–99)
Potassium: 3.4 mmol/L — ABNORMAL LOW (ref 3.5–5.1)
Sodium: 127 mmol/L — ABNORMAL LOW (ref 135–145)

## 2022-07-09 LAB — ECHOCARDIOGRAM COMPLETE
AR max vel: 1.58 cm2
AV Peak grad: 6 mmHg
Ao pk vel: 1.22 m/s
Area-P 1/2: 3.79 cm2
Calc EF: 41.8 %
Height: 63 in
S' Lateral: 3.95 cm
Single Plane A2C EF: 41.2 %
Single Plane A4C EF: 42.8 %
Weight: 2292.78 oz

## 2022-07-09 LAB — CBC
HCT: 34.9 % — ABNORMAL LOW (ref 36.0–46.0)
Hemoglobin: 12.4 g/dL (ref 12.0–15.0)
MCH: 30.8 pg (ref 26.0–34.0)
MCHC: 35.5 g/dL (ref 30.0–36.0)
MCV: 86.6 fL (ref 80.0–100.0)
Platelets: 235 10*3/uL (ref 150–400)
RBC: 4.03 MIL/uL (ref 3.87–5.11)
RDW: 12.9 % (ref 11.5–15.5)
WBC: 8.2 10*3/uL (ref 4.0–10.5)
nRBC: 0 % (ref 0.0–0.2)

## 2022-07-09 LAB — MAGNESIUM: Magnesium: 1.8 mg/dL (ref 1.7–2.4)

## 2022-07-09 LAB — HEMOGLOBIN A1C
Hgb A1c MFr Bld: 7.1 % — ABNORMAL HIGH (ref 4.8–5.6)
Mean Plasma Glucose: 157 mg/dL

## 2022-07-09 LAB — GLUCOSE, CAPILLARY
Glucose-Capillary: 182 mg/dL — ABNORMAL HIGH (ref 70–99)
Glucose-Capillary: 160 mg/dL — ABNORMAL HIGH (ref 70–99)
Glucose-Capillary: 135 mg/dL — ABNORMAL HIGH (ref 70–99)
Glucose-Capillary: 145 mg/dL — ABNORMAL HIGH (ref 70–99)

## 2022-07-09 MED ORDER — PERFLUTREN LIPID MICROSPHERE
1.0000 mL | INTRAVENOUS | Status: AC | PRN
  Administered 2022-07-09: 3 mL via INTRAVENOUS

## 2022-07-09 MED ORDER — SODIUM CHLORIDE 1 G PO TABS
1.0000 g | ORAL_TABLET | Freq: Two times a day (BID) | ORAL | Status: DC
  Administered 2022-07-09 – 2022-07-10 (×3): 1 g via ORAL
  Filled 2022-07-09 (×4): qty 1

## 2022-07-09 MED ORDER — POTASSIUM CHLORIDE 20 MEQ PO PACK
40.0000 meq | PACK | ORAL | Status: AC
  Administered 2022-07-09 (×2): 40 meq via ORAL
  Filled 2022-07-09 (×2): qty 2

## 2022-07-09 MED ORDER — POTASSIUM CHLORIDE CRYS ER 20 MEQ PO TBCR: 40.0000 meq | EXTENDED_RELEASE_TABLET | ORAL | Status: DC

## 2022-07-09 MED ORDER — SPIRONOLACTONE 12.5 MG HALF TABLET
12.5000 mg | ORAL_TABLET | Freq: Every day | ORAL | Status: DC
  Administered 2022-07-09 – 2022-07-10 (×2): 12.5 mg via ORAL
  Filled 2022-07-09 (×2): qty 1

## 2022-07-09 NOTE — Progress Notes (Addendum)
Patient Name: Jessica Wheeler Date of Encounter: 07/09/2022 [place] Cardiologist: Ida Rogue, MD   Interval Summary   Patient seen on AM rounds. Denies any chest pain or shortness of breath. Underwent successful PCI/DES to the mLAD. -3 L of urine output in the last 24 hours. No events recorded overnight. Sitting up eating breakfast this morning. Questions about medication changes made.  LHC 07/08/22 Conclusions: Significant multivessel coronary artery disease, as detailed below.  Culprit lesion for the patient's NSTEMI is a hazy 99% stenosis of the mid LAD with TIMI-1 flow.  In addition, there is moderate disease involving the distal LMCA, proximal/mid LAD, and RCA, as detailed below. Moderately elevated left heart filling pressures (LVEDP 30 mmHg, PCWP 26 mmHg). Mildly-moderately elevated right heart filling pressures (mean RA 11 mmHg, RVEDP 14 mmHg). Normal Fick cardiac output/index (CO 4.6 L/min, CI 2.8 L/min/m). Successful PCI to 99% mid LAD stenosis using Onyx frontier 2.0 x 12 mm drug-eluting stent with 0% residual stenosis and improvement in flow from TIMI-1 to TIMI-2.   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 Furosemide 40 mg IV given x 1 at the end of the procedure.  Consider redosing as needed based on urine output.  Potassium repletion also ordered. Aggressive secondary prevention of coronary artery disease. Initiate goal-directed medical therapy for acute HFrEF, as tolerated.  In the setting of soft blood pressure, I will decrease losartan to 25 mg daily.  Once the patient appears euvolemic, rechallenging with beta-blocker will need to be considered.  Diagnostic Dominance: Right  Intervention    Vital Signs   Vitals:   07/09/22 0400 07/09/22 0500 07/09/22 0600 07/09/22 0700  BP: (!) 99/46 (!) 103/52 (!) 113/57 (!) 110/57  Pulse: 90 92 94 87  Resp: 20 (!) 21 (!) 21 19   Temp:      TempSrc:      SpO2: 97% 97% 94% 99%  Weight:      Height:        Intake/Output Summary (Last 24 hours) at 07/09/2022 0814 Last data filed at 07/09/2022 0700 Gross per 24 hour  Intake 450 ml  Output 3500 ml  Net -3050 ml      07/08/2022    6:32 PM 07/07/2022    7:53 PM 05/27/2022    1:19 PM  Last 3 Weights  Weight (lbs) 143 lb 4.8 oz 141 lb 140 lb 6.4 oz  Weight (kg) 65 kg 63.957 kg 63.685 kg      Telemetry/ECG    Sinus rate in the 80's>>120s - Personally Reviewed  Physical Exam  GEN: No acute distress.   Neck: No JVD Cardiac: RRR, no murmurs, rubs, or gallops. Right radial cath site with gauze dressing clean,dry, and intact. No bleeding or hematoma, 2+ rt radial pulse, some bruising noted from wrist down to below wrist, no pain on palpitation Respiratory: Clear to auscultation bilaterally.Respirations are unlabored at rest on room air GI: Soft, nontender, non-distended  MS: No edema  Assessment & Plan    NSTEMI with history of coronary artery disease -presented with chest pain -hs troponins peaked at 6442 -currently chest pain free -LHC with PCI/DES mLAD -continued on asa, statin (patient has long history of intolerance), trcagrelor -cardiac rehab referral -out of bed to chair and ambulate today -continue with cardiac monitoring -EKG as needed for pain or changes -aggressive secondary prevention   Acute HFrEF with ischemic cardiomyopathy -given 40 mg furosemide  after LHC - -3 L output in the last 24 hours -echocardiogram ordered and pending, further recommendations to follow -daily bmp -escalate GDMT as tolerated -continued on losartan -started on spirolactone 12.5 mg daily  -patient with longstanding intolerance of beta blockers -daily wt, I's & O's, low sodium diet   Hypertension -blood pressure 110/57 -continued on amlodipine and losartan -vital signs per unit protocol  Hyperlipidemia -LDL 82 -goal 70 or less -patient has long standing  history of statin intolerance -can consider PCSK9i  Hypokalemia -serum potasium 3.4 -supplementation ordered -recommend keeping potassium level >4 and <5 -daily bmp -monitor/trend/replete electrolytes as needed  Hyponatremia -serum sodium 127 -HCTZ discontinued as it has likely exacerbated her hyponatremia - daily bmp -limit free water  Type 2 diabetes -continued on insulin -Management per IM  For questions or updates, please contact [place] Please consult [email or web address] for contact info under        Signed, SHERI HAMMOCK, NP   Doing well post PCI otherwise as above Sinus tach new suspect may be related to volume depletion from her very brisk diuresis.  Will hold her losartan right now given her relatively low blood pressure and plan to resume it in the morning.  Ambulate.  Hopefully can be discharged tomorrow.

## 2022-07-09 NOTE — Progress Notes (Signed)
  Progress Note   Patient: Jessica Wheeler [id number] DOB: 1940/10/26 DOA: 07/07/2022     2 DOS: the patient was seen and examined on 07/09/2022   Brief hospital course: Jessica Wheeler is a 82 y.o. Caucasian female with medical history significant for hypertension, dyslipidemia, glaucoma, type 2 diabetes mellitus, osteoarthritis, aortic dissection and right bundle branch block, who presented to the emergency room with acute onset of midsternal chest pain.  EKG did not show ST elevation.  Troponin elevated, and peaked at 6442.  Patient also placed on heparin drip, aspirin and Lipitor.  Heart cath performed on 3/8, showed 98% stenosis in mid LAD, drug-eluting stent placed.   Principal Problem:   NSTEMI (non-ST elevated myocardial infarction) ([place]) Active Problems:   Hyponatremia   Essential hypertension   Type 2 diabetes mellitus without complications (HCC)   Glaucoma   Renal artery stenosis (HCC)   Hypokalemia   Assessment and Plan:  NSTEMI (non-ST elevated myocardial infarction) [facility]) Status postcardiac cath and drug-eluting stent.  Currently treated with dual antiplatelet treatment with aspirin and Brilinta, also on Lipitor. Initial estimated ejection fraction due to the heart cath was 30%, echocardiogram still pending. Patient condition seem to be improved transfer patient to medical floor.   Hyponatremia Hypokalemia. Continue replete potassium, discontinue HCTZ, add sodium chloride tablet.   Type 2 diabetes mellitus without complications ([place]) Hold off metformin, continue sliding scale insulin.     Essential hypertension Continue to follow.        Subjective:  Patient doing well today, denies any chest pain or shortness of breath.  Physical Exam: Vitals:   07/09/22 1000 07/09/22 1100 07/09/22 1200 07/09/22 1300  BP: (!) 108/46  (!) 118/59 109/70  Pulse: 95 95 (!) 110 (!) 101  Resp: 18 19 (!) 29 (!) 27  Temp:      TempSrc:      SpO2: 99% 97% 93% 95%  Weight:       Height:       General exam: Appears calm and comfortable  Respiratory system: Clear to auscultation. Respiratory effort normal. Cardiovascular system: S1 & S2 heard, RRR. No JVD, murmurs, rubs, gallops or clicks. No pedal edema. Gastrointestinal system: Abdomen is nondistended, soft and nontender. No organomegaly or masses felt. Normal bowel sounds heard. Central nervous system: Alert and oriented. No focal neurological deficits. Extremities: Symmetric 5 x 5 power. Skin: No rashes, lesions or ulcers Psychiatry: Judgement and insight appear normal. Mood & affect appropriate.    Data Reviewed:  Lab results reviewed.  Family Communication: None  Disposition: Status is: Inpatient Remains inpatient appropriate because: Severity of disease,     Time spent: 35 minutes  Author: Sharen Hones, MD 07/09/2022 1:53 PM  For on call review [email or web address].

## 2022-07-09 NOTE — Progress Notes (Signed)
  Echocardiogram 2D Echocardiogram has been performed. Definity IV ultrasound imaging agent used on this study.  Jessica Wheeler 07/09/2022, 3:55 PM

## 2022-07-10 DIAGNOSIS — E876 Hypokalemia: Secondary | ICD-10-CM | POA: Diagnosis not present

## 2022-07-10 DIAGNOSIS — I5021 Acute systolic (congestive) heart failure: Secondary | ICD-10-CM | POA: Insufficient documentation

## 2022-07-10 DIAGNOSIS — I214 Non-ST elevation (NSTEMI) myocardial infarction: Secondary | ICD-10-CM | POA: Diagnosis not present

## 2022-07-10 DIAGNOSIS — E871 Hypo-osmolality and hyponatremia: Secondary | ICD-10-CM | POA: Diagnosis not present

## 2022-07-10 LAB — CBC
HCT: 34.2 % — ABNORMAL LOW (ref 36.0–46.0)
Hemoglobin: 11.8 g/dL — ABNORMAL LOW (ref 12.0–15.0)
MCH: 31.1 pg (ref 26.0–34.0)
MCHC: 34.5 g/dL (ref 30.0–36.0)
MCV: 90 fL (ref 80.0–100.0)
Platelets: 193 10*3/uL (ref 150–400)
RBC: 3.8 MIL/uL — ABNORMAL LOW (ref 3.87–5.11)
RDW: 13.4 % (ref 11.5–15.5)
WBC: 7.2 10*3/uL (ref 4.0–10.5)
nRBC: 0 % (ref 0.0–0.2)

## 2022-07-10 LAB — GLUCOSE, CAPILLARY
Glucose-Capillary: 165 mg/dL — ABNORMAL HIGH (ref 70–99)
Glucose-Capillary: 182 mg/dL — ABNORMAL HIGH (ref 70–99)

## 2022-07-10 LAB — BASIC METABOLIC PANEL
Anion gap: 8 (ref 5–15)
BUN: 17 mg/dL (ref 8–23)
CO2: 23 mmol/L (ref 22–32)
Calcium: 8.6 mg/dL — ABNORMAL LOW (ref 8.9–10.3)
Chloride: 98 mmol/L (ref 98–111)
Creatinine, Ser: 0.72 mg/dL (ref 0.44–1.00)
GFR, Estimated: >60 mL/min (ref >60)
Glucose, Bld: 134 mg/dL — ABNORMAL HIGH (ref 70–99)
Potassium: 4.3 mmol/L (ref 3.5–5.1)
Sodium: 129 mmol/L — ABNORMAL LOW (ref 135–145)

## 2022-07-10 LAB — PHOSPHORUS: Phosphorus: 2.8 mg/dL (ref 2.5–4.6)

## 2022-07-10 LAB — LIPOPROTEIN A (LPA): Lipoprotein (a): 15.7 nmol/L (ref <75.0)

## 2022-07-10 LAB — MAGNESIUM: Magnesium: 2 mg/dL (ref 1.7–2.4)

## 2022-07-10 MED ORDER — LOSARTAN POTASSIUM 25 MG PO TABS: 25.0000 mg | ORAL_TABLET | Freq: Every day | ORAL | 0 refills | Status: DC

## 2022-07-10 MED ORDER — TICAGRELOR 90 MG PO TABS: 90.0000 mg | ORAL_TABLET | Freq: Two times a day (BID) | ORAL | 0 refills | Status: DC

## 2022-07-10 MED ORDER — ATORVASTATIN CALCIUM 80 MG PO TABS: 80.0000 mg | ORAL_TABLET | Freq: Every day | ORAL | 0 refills | Status: DC

## 2022-07-10 NOTE — Discharge Summary (Signed)
Physician Discharge Summary   Patient: Jessica Wheeler MRN: [id number] DOB: [birth year]  Admit date:     07/07/2022  Discharge date: 07/10/22  Discharge Physician: Sharen Hones   PCP: Virginia Crews, MD   Recommendations at discharge:   Follow-up with PCP in 1 week. Follow-up with cardiology in 2 weeks. Cardiology has referred patient to cardiac rehab program.  Discharge Diagnoses: Principal Problem:   NSTEMI (non-ST elevated myocardial infarction) [facility]) Active Problems:   Hyponatremia   Essential hypertension   Type 2 diabetes mellitus without complications (HCC)   Glaucoma   Renal artery stenosis (HCC)   Hypokalemia   Acute systolic (congestive) heart failure ([place])  Resolved Problems:   * No resolved hospital problems. *  Hospital Course: Jessica Wheeler is a 82 y.o. Caucasian female with medical history significant for hypertension, dyslipidemia, glaucoma, type 2 diabetes mellitus, osteoarthritis, aortic dissection and right bundle branch block, who presented to the emergency room with acute onset of midsternal chest pain.  EKG did not show ST elevation.  Troponin elevated, and peaked at 6442.  Patient also placed on heparin drip, aspirin and Lipitor.  Heart cath performed on 3/8, showed 98% stenosis in mid LAD, drug-eluting stent placed.  Assessment and Plan: NSTEMI (non-ST elevated myocardial infarction) ([place]) Acute systolic congestive heart failure with ejection fraction 35 to 40%. Status postcardiac cath and drug-eluting stent.  Currently treated with dual antiplatelet treatment with aspirin and Brilinta, also on Lipitor.  Patient also received IV Lasix during the heart cath for volume overload with shortness of breath.  Echocardiogram showed ejection fraction 35 to 40%.  This is acute systolic congestive heart failure due to non-STEMI.  Patient blood pressure has been soft, not able to start beta-blocker, losartan dose was decreased to 25 mg daily.  Patient be followed  by cardiology in the near future to restart beta-blocker.  Patient also will be referred to cardio rehab program by cardiology.   Hyponatremia Hypokalemia. Sodium level went up to 129, potassium normalized.  At this point I will discontinue HCTZ, follow-up with PCP for another BMP in the near future.   Type 2 diabetes mellitus without complications ([place]) Restart home regimen.  Essential hypertension Patient has soft blood pressure due to acute coronary syndrome, currently on reduced ARB.         Consultants: Cardiology Procedures performed: Heart cath  Disposition: Home Diet recommendation:  Cardiac diet DISCHARGE MEDICATION: Allergies as of 07/10/2022       Reactions   Beta Adrenergic Blockers    Chest pressure/difficulty breathing   Jardiance [empagliflozin]    Muscle cramps   Levaquin [levofloxacin In D5w]    Low blood sugar; does take this when she needs it. She just makes sure to control her BS to avoid hypoglycemia.   Lisinopril Cough   Zetia [ezetimibe]    Lethargic    Prevnar [pneumococcal 13-val Conj Vacc] Rash   Joint Pain   Sulfa Antibiotics Rash   "makes my skin raw"        Medication List     STOP taking these medications    amLODipine 5 MG tablet Commonly known as: NORVASC   hydrochlorothiazide 25 MG tablet Commonly known as: HYDRODIURIL   ibuprofen 200 MG tablet Commonly known as: ADVIL       TAKE these medications    Accu-Chek FastClix Lancet Kit 1 each by Does not apply route as directed.   Accu-Chek FastClix Lancets Misc TEST FASTING BLOOD SUGAR EVERY DAY  Accu-Chek Guide test strip Generic drug: glucose blood TEST FASTING BLOOD SUGAR EVERY DAY AS DIRECTED   Accu-Chek Guide w/Device Kit USE AS DIRECTED TO CHECK FASTING BLOOD SUGAR DAILY   aspirin EC 81 MG tablet Take 81 mg by mouth daily.   atorvastatin 80 MG tablet Commonly known as: LIPITOR Take 1 tablet (80 mg total) by mouth at bedtime.   cyanocobalamin 1000 MCG  tablet Commonly known as: VITAMIN B12 Take 1,000 mcg by mouth daily.   cyclobenzaprine 5 MG tablet Commonly known as: FLEXERIL Take ONE tab PO BID/TID PRN   latanoprost 0.005 % ophthalmic solution Commonly known as: XALATAN Place 1 drop into both eyes at bedtime.   losartan 25 MG tablet Commonly known as: Cozaar Take 1 tablet (25 mg total) by mouth daily. What changed:  medication strength how much to take   Magnesium 250 MG Tabs Take 250 mg by mouth daily.   metFORMIN 500 MG tablet Commonly known as: GLUCOPHAGE TAKE 2 TABLETS TWICE DAILY WITH MEALS   ticagrelor 90 MG Tabs tablet Commonly known as: BRILINTA Take 1 tablet (90 mg total) by mouth 2 (two) times daily.        Follow-up Information     Virginia Crews, MD Follow up in 1 week(s).   Specialty: Family Medicine Contact information: [address] [place] [address] [phone]         Minna Merritts, MD Follow up in 2 week(s).   Specialty: Cardiology Contact information: [place] [address] [phone]                Discharge Exam: Danley Danker Weights   07/07/22 1953 07/08/22 1832  Weight: 64 kg 65 kg   General exam: Appears calm and comfortable  Respiratory system: Clear to auscultation. Respiratory effort normal. Cardiovascular system: S1 & S2 heard, RRR. No JVD, murmurs, rubs, gallops or clicks. No pedal edema. Gastrointestinal system: Abdomen is nondistended, soft and nontender. No organomegaly or masses felt. Normal bowel sounds heard. Central nervous system: Alert and oriented. No focal neurological deficits. Extremities: Symmetric 5 x 5 power. Skin: No rashes, lesions or ulcers Psychiatry: Judgement and insight appear normal. Mood & affect appropriate.    Condition at discharge: good  The results of significant diagnostics from this hospitalization (including imaging, microbiology, ancillary and laboratory) are listed below  for reference.   Imaging Studies: ECHOCARDIOGRAM COMPLETE  Result Date: 07/09/2022    ECHOCARDIOGRAM REPORT   Patient Name:   Jessica Wheeler Date of Exam: 07/09/2022 Medical Rec #:  [id number]      Height:       63.0 in Accession #:    [id number]     Weight:       143.3 lb Date of Birth:  Sep 14, 1940     BSA:          1.678 m Patient Age:    26 years       BP:           110/57 mmHg Patient Gender: F              HR:           89 bpm. Exam Location:  [facility] Procedure: 2D Echo and Intracardiac Opacification Agent Indications:     Acute Myocardial Infarction I21.9  History:         Patient has prior history of Echocardiogram examinations, most  recent 11/01/2011.  Sonographer:     Kathlen Brunswick RDCS Referring Phys:  [phone] CHRISTOPHER END Diagnosing Phys: Ida Rogue MD  Sonographer Comments: Technically difficult study due to poor echo windows, suboptimal parasternal window, suboptimal apical window and suboptimal subcostal window. Image acquisition challenging due to respiratory motion. IMPRESSIONS  1. Left ventricular ejection fraction, by estimation, is 35 to 40%. The left ventricle has moderately decreased function. 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  Left ventricular diastolic parameters are consistent with Grade I diastolic dysfunction (impaired relaxation).  2. Right ventricular systolic function is normal. The right ventricular size is normal. Tricuspid regurgitation signal is inadequate for assessing PA pressure.  3. The mitral valve is normal in structure. Mild mitral valve regurgitation. No evidence of mitral stenosis.  4. The aortic valve was not well visualized. Aortic valve regurgitation is not visualized. No aortic stenosis is present.  5. The inferior vena cava is normal in size with greater than 50% respiratory variability, suggesting right atrial pressure of 3 mmHg. FINDINGS  Left  Ventricle: Left ventricular ejection fraction, by estimation, is 35 to 40%. The left ventricle has moderately decreased function. The left ventricle demonstrates global hypokinesis. Definity contrast agent was given IV to delineate the left ventricular endocardial borders. The left ventricular internal cavity size was normal in size. There is no left ventricular hypertrophy. Left ventricular diastolic parameters are consistent with Grade I diastolic dysfunction (impaired relaxation). Right Ventricle: The right ventricular size is normal. No increase in right ventricular wall thickness. Right ventricular systolic function is normal. Tricuspid regurgitation signal is inadequate for assessing PA pressure. Left Atrium: Left atrial size was normal in size. Right Atrium: Right atrial size was normal in size. Pericardium: There is no evidence of pericardial effusion. Mitral Valve: The mitral valve is normal in structure. Mild mitral valve regurgitation. No evidence of mitral valve stenosis. Tricuspid Valve: The tricuspid valve is normal in structure. Tricuspid valve regurgitation is not demonstrated. No evidence of tricuspid stenosis. Aortic Valve: The aortic valve was not well visualized. Aortic valve regurgitation is not visualized. No aortic stenosis is present. Aortic valve peak gradient measures 6.0 mmHg. Pulmonic Valve: The pulmonic valve was normal in structure. Pulmonic valve regurgitation is not visualized. No evidence of pulmonic stenosis. Aorta: The aortic root is normal in size and structure. Venous: The inferior vena cava is normal in size with greater than 50% respiratory variability, suggesting right atrial pressure of 3 mmHg. IAS/Shunts: No atrial level shunt detected by color flow Doppler.  LEFT VENTRICLE PLAX 2D LVIDd:         5.00 cm     Diastology LVIDs:         3.95 cm     LV e' medial:    4.68 cm/s LV PW:         0.95 cm     LV E/e' medial:  24.4 LV IVS:        0.95 cm     LV e' lateral:   5.44 cm/s  LVOT diam:     1.80 cm     LV E/e' lateral: 21.0 LV SV:         40 LV SV Index:   24 LVOT Area:     2.54 cm  LV Volumes (MOD) LV vol d, MOD A2C: 86.5 ml LV vol d, MOD A4C: 75.0 ml LV vol s, MOD A2C: 50.9 ml LV vol s, MOD A4C: 42.9 ml LV  SV MOD A2C:     35.6 ml LV SV MOD A4C:     75.0 ml LV SV MOD BP:      33.7 ml RIGHT VENTRICLE RV S prime:     5.44 cm/s LEFT ATRIUM             Index LA diam:        2.50 cm 1.49 cm/m LA Vol (A2C):   26.7 ml 15.91 ml/m LA Vol (A4C):   23.5 ml 14.00 ml/m LA Biplane Vol: 26.6 ml 15.85 ml/m  AORTIC VALVE                 PULMONIC VALVE AV Area (Vmax): 1.58 cm     RVOT Peak grad: 3 mmHg AV Vmax:        122.00 cm/s AV Peak Grad:   6.0 mmHg LVOT Vmax:      75.80 cm/s LVOT Vmean:     54.700 cm/s LVOT VTI:       0.159 m  AORTA Ao Root diam: 2.90 cm Ao Asc diam:  2.70 cm MITRAL VALVE MV Area (PHT): 3.79 cm     SHUNTS MV Decel Time: 200 msec     Systemic VTI:  0.16 m MV E velocity: 114.00 cm/s  Systemic Diam: 1.80 cm MV A velocity: 117.00 cm/s MV E/A ratio:  0.97 Ida Rogue MD Electronically signed by Ida Rogue MD Signature Date/Time: 07/09/2022/7:05:01 PM    Final    CARDIAC CATHETERIZATION  Result Date: 07/08/2022 Conclusions: Significant multivessel coronary artery disease, as detailed below.  Culprit lesion for the patient's NSTEMI is a hazy 99% stenosis of the mid LAD with TIMI-1 flow.  In addition, there is moderate disease involving the distal LMCA, proximal/mid LAD, and RCA, as detailed below. Moderately elevated left heart filling pressures (LVEDP 30 mmHg, PCWP 26 mmHg). Mildly-moderately elevated right heart filling pressures (mean RA 11 mmHg, RVEDP 14 mmHg). Normal Fick cardiac output/index (CO 4.6 L/min, CI 2.8 L/min/m). Successful PCI to 99% mid LAD stenosis using Onyx frontier 2.0 x 12 mm drug-eluting stent with 0% residual stenosis and improvement in flow from TIMI-1 to TIMI-2. Recommendations: Transfer to stepdown for post PCI monitoring in the setting of acute  HFrEF due to a hemic cardiomyopathy. Furosemide 40 mg IV given x 1 at the end of the procedure.  Consider redosing as needed based on urine output.  Potassium repletion also ordered. Aggressive secondary prevention of coronary artery disease. Initiate goal-directed medical therapy for acute HFrEF, as tolerated.  In the setting of soft blood pressure, I will decrease losartan to 25 mg daily.  Once the patient appears euvolemic, rechallenging with beta-blocker will need to be considered. Nelva Bush, MD [facility]  DG Chest 1 View  Result Date: 07/07/2022 CLINICAL DATA:  Chest pain EXAM: CHEST  1 VIEW COMPARISON:  04/20/2021 FINDINGS: Heart and mediastinal contours within normal limits. Continued mild diffuse interstitial prominence, similar prior study. No acute confluent opacities or effusions. No acute bony abnormality. IMPRESSION: Stable mild interstitial prominence, favor chronic interstitial lung disease. No active disease. Electronically Signed   By: Rolm Baptise M.D.   On: 07/07/2022 20:17   MM 3D SCREEN BREAST BILATERAL  Result Date: 06/23/2022 CLINICAL DATA:  Screening. EXAM: DIGITAL SCREENING BILATERAL MAMMOGRAM WITH TOMOSYNTHESIS AND CAD TECHNIQUE: Bilateral screening digital craniocaudal and mediolateral oblique mammograms were obtained. Bilateral screening digital breast tomosynthesis was performed. The images were evaluated with computer-aided detection. COMPARISON:  Previous exam(s). ACR Breast Density Category b: There  are scattered areas of fibroglandular density. FINDINGS: There are no findings suspicious for malignancy. IMPRESSION: No mammographic evidence of malignancy. A result letter of this screening mammogram will be mailed directly to the patient. RECOMMENDATION: Screening mammogram in one year. (Code:SM-B-01Y) BI-RADS CATEGORY  1: Negative. Electronically Signed   By: Lajean Manes M.D.   On: 06/23/2022 13:47    Microbiology: Results for orders placed or performed during  the hospital encounter of 07/07/22  MRSA Next Gen by PCR, Nasal     Status: None   Collection Time: 07/08/22  6:38 PM   Specimen: Nasal Mucosa; Nasal Swab  Result Value Ref Range Status   MRSA by PCR Next Gen NOT DETECTED NOT DETECTED Final    Comment: (NOTE) The GeneXpert MRSA Assay (FDA approved for NASAL specimens only), is one component of a comprehensive MRSA colonization surveillance program. It is not intended to diagnose MRSA infection nor to guide or monitor treatment for MRSA infections. Test performance is not FDA approved in patients less than 67 years old. Performed at [facility], [address] [place], [place] [address]     Labs: CBC: Recent Labs  Lab 07/07/22 1959 07/08/22 0536 07/08/22 1656 07/08/22 1658 07/09/22 0636 07/10/22 0343  WBC 6.2 8.8  --   --  8.2 7.2  HGB 13.0 12.8 12.2 11.6* 12.4 11.8*  HCT 37.2 36.1 36.0 34.0* 34.9* 34.2*  MCV 89.0 88.0  --   --  86.6 90.0  PLT 240 220  --   --  235 0000000   Basic Metabolic Panel: Recent Labs  Lab 07/07/22 1959 07/08/22 0536 07/08/22 1656 07/08/22 1658 07/09/22 0636 07/10/22 0343  NA 127* 127* 124* 125* 127* 129*  K 3.5 3.4* 2.8* 2.8* 3.4* 4.3  CL 93* 94*  --   --  94* 98  CO2 21* 21*  --   --  23 23  GLUCOSE 209* 161*  --   --  151* 134*  BUN 22 16  --   --  13 17  CREATININE 0.69 0.56  --   --  0.69 0.72  CALCIUM 9.2 9.0  --   --  8.6* 8.6*  MG  --   --   --   --  1.8 2.0  PHOS  --   --   --   --   --  2.8   Liver Function Tests: Recent Labs  Lab 07/07/22 1959  AST 32  ALT 25  ALKPHOS 46  BILITOT 0.8  PROT 7.1  ALBUMIN 4.1   CBG: Recent Labs  Lab 07/09/22 1116 07/09/22 1546 07/09/22 2102 07/10/22 0719 07/10/22 1109  GLUCAP 160* 135* 145* 165* 182*    Discharge time spent: greater than 30 minutes.  Signed: Sharen Hones, MD [facility] 07/10/2022

## 2022-07-10 NOTE — Progress Notes (Addendum)
Rounding Note    Patient Name: Jessica Wheeler Date of Encounter: 07/10/2022  [place] Cardiologist: Ida Rogue, MD   Subjective   Patient seen on AM rounds. Denies any chest pain of shortness of breath. Has been ambulating around the unit without difficulty.  Inpatient Medications    Scheduled Meds:  aspirin EC  81 mg Oral Daily   atorvastatin  80 mg Oral QHS   Chlorhexidine Gluconate Cloth  6 each Topical Q0600   cyanocobalamin  1,000 mcg Oral Daily   enoxaparin (LOVENOX) injection  40 mg Subcutaneous Q24H   insulin aspart  0-5 Units Subcutaneous QHS   insulin aspart  0-9 Units Subcutaneous TID WC   latanoprost  1 drop Both Eyes QHS   magnesium oxide  200 mg Oral Daily   sodium chloride flush  3 mL Intravenous Q12H   sodium chloride flush  3 mL Intravenous Q12H   sodium chloride  1 g Oral BID WC   spironolactone  12.5 mg Oral Daily   ticagrelor  90 mg Oral BID   Continuous Infusions:  sodium chloride     PRN Meds: sodium chloride, acetaminophen, ALPRAZolam, cyclobenzaprine, magnesium hydroxide, morphine injection, nitroGLYCERIN, ondansetron (ZOFRAN) IV, sodium chloride flush, traZODone   Vital Signs    Vitals:   07/10/22 0300 07/10/22 0400 07/10/22 0600 07/10/22 0700  BP: (!) 103/53 120/63 121/63 (!) 115/57  Pulse: 77 80 82 81  Resp: '18 19 18 16  '$ Temp: 98.6 F (37 C)     TempSrc: Oral     SpO2: 95% 94% 97% 98%  Weight:      Height:        Intake/Output Summary (Last 24 hours) at 07/10/2022 1045 Last data filed at 07/10/2022 0530 Gross per 24 hour  Intake 540 ml  Output 675 ml  Net -135 ml      07/08/2022    6:32 PM 07/07/2022    7:53 PM 05/27/2022    1:19 PM  Last 3 Weights  Weight (lbs) 143 lb 4.8 oz 141 lb 140 lb 6.4 oz  Weight (kg) 65 kg 63.957 kg 63.685 kg      Telemetry    Sinus rates 80-90 - Personally Reviewed  ECG    No new tracings - Personally Reviewed  Physical Exam   GEN: No acute distress.   Neck: No  JVD Cardiac: RRR, no murmurs, rubs, or gallops. Right radial cath site with bruising noted without bleeding or hematoma, 2+ radial pulse Respiratory: Clear to auscultation bilaterally. Respirations are unlabored on room air GI: Soft, nontender, non-distended  MS: No edema; No deformity. Neuro:  Nonfocal  Psych: Normal affect   Labs    High Sensitivity Troponin:   Recent Labs  Lab 07/07/22 1959 07/07/22 2158  TROPONINIHS 502* 6,442*     Chemistry Recent Labs  Lab 07/07/22 1959 07/08/22 0536 07/08/22 1656 07/08/22 1658 07/09/22 0636 07/10/22 0343  NA 127* 127*   < > 125* 127* 129*  K 3.5 3.4*   < > 2.8* 3.4* 4.3  CL 93* 94*  --   --  94* 98  CO2 21* 21*  --   --  23 23  GLUCOSE 209* 161*  --   --  151* 134*  BUN 22 16  --   --  13 17  CREATININE 0.69 0.56  --   --  0.69 0.72  CALCIUM 9.2 9.0  --   --  8.6* 8.6*  MG  --   --   --   --  1.8 2.0  PROT 7.1  --   --   --   --   --   ALBUMIN 4.1  --   --   --   --   --   AST 32  --   --   --   --   --   ALT 25  --   --   --   --   --   ALKPHOS 46  --   --   --   --   --   BILITOT 0.8  --   --   --   --   --   GFRNONAA >60 >60  --   --  >60 >60  ANIONGAP 13 12  --   --  10 8   < > = values in this interval not displayed.    Lipids  Recent Labs  Lab 07/08/22 0536  CHOL 197  TRIG 62  HDL 103  LDLCALC 82  CHOLHDL 1.9    Hematology Recent Labs  Lab 07/08/22 0536 07/08/22 1656 07/08/22 1658 07/09/22 0636 07/10/22 0343  WBC 8.8  --   --  8.2 7.2  RBC 4.10  --   --  4.03 3.80*  HGB 12.8   < > 11.6* 12.4 11.8*  HCT 36.1   < > 34.0* 34.9* 34.2*  MCV 88.0  --   --  86.6 90.0  MCH 31.2  --   --  30.8 31.1  MCHC 35.5  --   --  35.5 34.5  RDW 13.1  --   --  12.9 13.4  PLT 220  --   --  235 193   < > = values in this interval not displayed.   Thyroid No results for input(s): "TSH", "FREET4" in the last 168 hours.  BNPNo results for input(s): "BNP", "PROBNP" in the last 168 hours.  DDimer No results for input(s):  "DDIMER" in the last 168 hours.   Radiology      Cardiac Studies  TTE 07/09/22 1. Left ventricular ejection fraction, by estimation, is 35 to 40%. The  left ventricle has moderately decreased function. The left ventricle  demonstrates global hypokinesis, basal wall motion best preserved possibly  consistent with stress cardioyopathy.   Left ventricular diastolic parameters are consistent with Grade I  diastolic dysfunction (impaired relaxation).   2. Right ventricular systolic function is normal. The right ventricular  size is normal. Tricuspid regurgitation signal is inadequate for assessing  PA pressure.   3. The mitral valve is normal in structure. Mild mitral valve  regurgitation. No evidence of mitral stenosis.   4. The aortic valve was not well visualized. Aortic valve regurgitation  is not visualized. No aortic stenosis is present.   5. The inferior vena cava is normal in size with greater than 50%  respiratory variability, suggesting right atrial pressure of 3 mmHg.   LHC 07/08/22 Conclusions: Significant multivessel coronary artery disease, as detailed below.  Culprit lesion for the patient's NSTEMI is a hazy 99% stenosis of the mid LAD with TIMI-1 flow.  In addition, there is moderate disease involving the distal LMCA, proximal/mid LAD, and RCA, as detailed below. Moderately elevated left heart filling pressures (LVEDP 30 mmHg, PCWP 26 mmHg). Mildly-moderately elevated right heart filling pressures (mean RA 11 mmHg, RVEDP 14 mmHg). Normal Fick cardiac output/index (CO 4.6 L/min, CI 2.8 L/min/m). Successful PCI to 99% mid LAD stenosis using Onyx frontier 2.0 x 12 mm drug-eluting stent with 0% residual stenosis  and improvement in flow from TIMI-1 to TIMI-2.   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 Furosemide 40 mg IV given x 1 at the end of the procedure.  Consider redosing as needed based on  urine output.  Potassium repletion also ordered. Aggressive secondary prevention of coronary artery disease. Initiate goal-directed medical therapy for acute HFrEF, as tolerated.  In the setting of soft blood pressure, I will decrease losartan to 25 mg daily.  Once the patient appears euvolemic, rechallenging with beta-blocker will need to be considered.   Diagnostic Dominance: Right  Intervention     Patient Profile     82 y.o. female with a hx of CAD, type II DM, HTN, former smoker, hx of aortic dissection, renal artery stenosis, chronic RBBB, hx of pancreatitis, chronic hyponatremia, who is being seen and evaluated for an NSTEMI.  Assessment & Plan    NSTEMI with history of coronary artery disease -presented with chest pain with hs troponin peaking at 6442 -remains chest pain free -successful LHC with PCI/DES to mLAD -continued on asa, statin, and Brilinta  -cardiac rehab referral, discussed program with patient -EKG for pain or changes -aggressive secondary prevention  Acute HFrEF with ischemic cardiomyopathy -LVEF 35-40% -continued on spirolactone 12.5 mg daily -losartan held due to soft blood pressures -not on beta blocker due to documented intolerance -escalate GDMT as allowed by blood pressure -Daily weight, I's&O;s, and low sodium diet -heart failure education  Hypertension -blood pressure 115/57 -losartan on hold due to soft bp -vital signs per unit protocol  Hyperlipidemia -LDL 82 -goal of 70 or less -continued on atorvastatin  -consider PCSK9i as outpatient as patient has long intolerance of statin therapy  Hypokalemia -resolved -serum potassium 4.3 -daily bmp -recommend keeping serum potassium >4 and <5 -monitor/trend/replete electrolytes as needed  Hyponatremia -serum sodium 127 -daily bmp -limit free water -started on sodium tablets by IM -will need close monitoring on sodium tablet with HFrEF  Type II Diabetes -continued on insulin   -management per IM     For questions or updates, please contact [place] Please consult [email or web address] for contact info under        Signed, SHERI HAMMOCK, NP  07/10/2022, 10:45 AM   Seen and agree Will discharge to home  With cardiology followup for uptitration of meds as BP allows -- will need K at followup

## 2022-07-11 ENCOUNTER — Encounter: Payer: Self-pay | Admitting: Internal Medicine

## 2022-07-11 ENCOUNTER — Telehealth: Payer: Self-pay | Admitting: *Deleted

## 2022-07-11 NOTE — Progress Notes (Signed)
I,Sulibeya S Dimas,acting as a Neurosurgeon for Shirlee Latch, MD.,have documented all relevant documentation on the behalf of Shirlee Latch, MD,as directed by  Shirlee Latch, MD while in the presence of Shirlee Latch, MD.     Established patient visit   Patient: Jessica Wheeler   DOB: 1940/06/20   82 y.o. Female  MRN: [id number] Visit Date: 07/12/2022  Today's healthcare provider: Shirlee Latch, MD   Chief Complaint  Patient presents with   Hospitalization Follow-up   Subjective    HPI  Follow up Hospitalization  Patient was admitted to [facility] on 07/07/22 and discharged on 07/10/22. She was treated for NSTEMI. Treatment for this included dual antiplatelet treatment with aspirin and Brilinta, also on lipitor. Patient also reveived IV lasix during the heart cath for volume overload with shortness of breath.  Hyponatremia-sodium level went up to 129, potassium normalized. D/C HCTZ, F/u with PCP for another BMP.  Telephone follow up was done on 07/11/22 She reports excellent compliance with treatment. She reports this condition is resolved.  ----------------------------------------------------------------------------------------- - who presented to the emergency room with acute onset of midsternal chest pain. EKG did not show ST elevation. Troponin elevated, and peaked at 6442. Patient also placed on heparin drip, aspirin and Lipitor. Heart cath performed on 3/8, showed 98% stenosis in mid LAD, drug-eluting stent placed.    Medications: Outpatient Medications Prior to Visit  Medication Sig   Accu-Chek FastClix Lancets MISC TEST FASTING BLOOD SUGAR EVERY DAY   aspirin 81 MG EC tablet Take 81 mg by mouth daily.   atorvastatin (LIPITOR) 80 MG tablet Take 1 tablet (80 mg total) by mouth at bedtime.   Blood Glucose Monitoring Suppl (ACCU-CHEK GUIDE) w/Device KIT USE AS DIRECTED TO CHECK FASTING BLOOD SUGAR DAILY   cyclobenzaprine (FLEXERIL) 5 MG tablet Take ONE tab PO  BID/TID PRN   glucose blood (ACCU-CHEK GUIDE) test strip TEST FASTING BLOOD SUGAR EVERY DAY AS DIRECTED   Lancets Misc. (ACCU-CHEK FASTCLIX LANCET) KIT 1 each by Does not apply route as directed.   latanoprost (XALATAN) 0.005 % ophthalmic solution Place 1 drop into both eyes at bedtime.    losartan (COZAAR) 25 MG tablet Take 1 tablet (25 mg total) by mouth daily.   Magnesium 250 MG TABS Take 250 mg by mouth daily.   metFORMIN (GLUCOPHAGE) 500 MG tablet TAKE 2 TABLETS TWICE DAILY WITH MEALS   ticagrelor (BRILINTA) 90 MG TABS tablet Take 1 tablet (90 mg total) by mouth 2 (two) times daily.   vitamin B-12 (CYANOCOBALAMIN) 1000 MCG tablet Take 1,000 mcg by mouth daily.   No facility-administered medications prior to visit.    Review of Systems  Constitutional:  Positive for activity change and fatigue. Negative for appetite change, chills and fever.  HENT:  Positive for postnasal drip and rhinorrhea. Negative for ear pain, sinus pressure and sinus pain.   Eyes:  Negative for visual disturbance.  Respiratory:  Positive for cough and shortness of breath. Negative for chest tightness and wheezing.   Cardiovascular:  Negative for chest pain.  Gastrointestinal:  Negative for abdominal pain, nausea and vomiting.  Neurological:  Negative for dizziness, light-headedness and headaches.       Objective    BP 132/75 (BP Location: Left Arm, Patient Position: Sitting, Cuff Size: Normal)   Pulse 92   Temp 98.3 F (36.8 C) (Oral)   Resp (!) 24   Wt 140 lb (63.5 kg)   SpO2 99%   BMI 24.80 kg/m    Physical  Exam Vitals reviewed.  Constitutional:      General: She is not in acute distress.    Appearance: Normal appearance. She is well-developed. She is not diaphoretic.  HENT:     Head: Normocephalic and atraumatic.  Eyes:     General: No scleral icterus.    Conjunctiva/sclera: Conjunctivae normal.  Neck:     Thyroid: No thyromegaly.  Cardiovascular:     Rate and Rhythm: Normal rate and  regular rhythm.     Heart sounds: Normal heart sounds. No murmur heard. Pulmonary:     Effort: Pulmonary effort is normal. No respiratory distress.     Breath sounds: Normal breath sounds. No wheezing, rhonchi or rales.  Musculoskeletal:     Cervical back: Neck supple.     Right lower leg: No edema.     Left lower leg: No edema.  Lymphadenopathy:     Cervical: No cervical adenopathy.  Skin:    General: Skin is warm and dry.     Findings: Bruising (of R arm) present. No rash.  Neurological:     Mental Status: She is alert and oriented to person, place, and time. Mental status is at baseline.  Psychiatric:        Mood and Affect: Mood normal.        Behavior: Behavior normal.      No results found for any visits on 07/12/22.  Assessment & Plan     Problem List Items Addressed This Visit       Cardiovascular and Mediastinum   Hypertension associated with diabetes (HCC)    Well controlled Continue current medications Recheck metabolic panel F/u in 6 months       CAD (coronary artery disease)    Recent NSTEMI with drug eluting stent placed Doing well currently Needs to schedule appt with Cardiology Allergy to BB listed in chart Plan at discharge to get her set up for cardiac rehab      NSTEMI (non-ST elevated myocardial infarction) (HCC) - Primary    Recent NSTEMI with drug eluting stent placed Doing well currently Needs to schedule appt with Cardiology Allergy to BB listed in chart Plan at discharge to get her set up for cardiac rehab        Other   Hyponatremia    Likely 2/2 HCTZ Was stopped at discharge Recheck BMP      Relevant Orders   Basic Metabolic Panel (BMET)     Return in about 4 weeks (around 08/09/2022) for CPE, as scheduled.      I, Shirlee Latch, MD, have reviewed all documentation for this visit. The documentation on 07/12/22 for the exam, diagnosis, procedures, and orders are all accurate and complete.   Lorrayne Ismael, Marzella Schlein, MD,  [facility]

## 2022-07-11 NOTE — Transitions of Care (Post Inpatient/ED Visit) (Signed)
   07/11/2022  Name: Jessica Wheeler MRN: [id number] DOB: [birth year]  Today's TOC FU Call Status: Today's TOC FU Call Status:: Successful TOC FU Call Competed TOC FU Call Complete Date: 07/11/22  Transition Care Management Follow-up Telephone Call Date of Discharge: 07/10/22 Discharge Facility: [facility]) Type of Discharge: Inpatient Admission Primary Inpatient Discharge Diagnosis:: NSTEMI How have you been since you were released from the hospital?: Better Any questions or concerns?: No  Items Reviewed: Did you receive and understand the discharge instructions provided?: Yes Medications obtained and verified?: Yes (Medications Reviewed) Any new allergies since your discharge?: No Dietary orders reviewed?: Yes Type of Diet Ordered:: Cardiac diet Do you have support at home?: Yes People in Home: child(ren), adult Name of Support/Comfort Primary Source: [facility] and Equipment/Supplies: [place] Ordered?: No Any new equipment or medical supplies ordered?: No  Functional Questionnaire: Do you need assistance with bathing/showering or dressing?: No Do you need assistance with meal preparation?: No Do you need assistance with eating?: No Do you have difficulty maintaining continence: No Do you need assistance with getting out of bed/getting out of a chair/moving?: No Do you have difficulty managing or taking your medications?: No  Folllow up appointments reviewed: PCP Follow-up appointment confirmed?: Yes Date of PCP follow-up appointment?: 07/12/22 Follow-up Provider: Dr [name] [facility] Follow-up appointment confirmed?: No Reason Specialist Follow-Up Not Confirmed: Patient has Specialist Provider Number and will Call for Appointment Do you need transportation to your follow-up appointment?: No Do you understand care options if your condition(s) worsen?: Yes-patient verbalized understanding  SDOH  Interventions Today    Flowsheet Row Most Recent Value  SDOH Interventions   Food Insecurity Interventions Intervention Not Indicated  Housing Interventions Intervention Not Indicated  Transportation Interventions Intervention Not Indicated      Interventions Today    Flowsheet Row Most Recent Value  General Interventions   General Interventions Discussed/Reviewed Referral to Nurse, Doctor Visits  [Scheduled Care Coordination with Valente David [phone] 2:00]  Doctor Visits Discussed/Reviewed Doctor Visits Discussed, Doctor Visits Reviewed  Burnard Bunting all appointments and gave number to ambulatory referral for cardiac rehab.]      TOC Interventions Today    Flowsheet Row Most Recent Value  TOC Interventions   TOC Interventions Discussed/Reviewed TOC Interventions Discussed, TOC Interventions Reviewed       [place] [facility] [phone]

## 2022-07-12 ENCOUNTER — Encounter: Payer: Self-pay | Admitting: Family Medicine

## 2022-07-12 ENCOUNTER — Telehealth: Payer: Self-pay | Admitting: Cardiovascular Disease

## 2022-07-12 ENCOUNTER — Ambulatory Visit (INDEPENDENT_AMBULATORY_CARE_PROVIDER_SITE_OTHER): Payer: Medicare HMO | Admitting: Family Medicine

## 2022-07-12 VITALS — BP 132/75 | HR 92 | Temp 98.3°F | Resp 24 | Wt 140.0 lb

## 2022-07-12 DIAGNOSIS — E1159 Type 2 diabetes mellitus with other circulatory complications: Secondary | ICD-10-CM

## 2022-07-12 DIAGNOSIS — E871 Hypo-osmolality and hyponatremia: Secondary | ICD-10-CM

## 2022-07-12 DIAGNOSIS — I25118 Atherosclerotic heart disease of native coronary artery with other forms of angina pectoris: Secondary | ICD-10-CM | POA: Diagnosis not present

## 2022-07-12 DIAGNOSIS — I214 Non-ST elevation (NSTEMI) myocardial infarction: Secondary | ICD-10-CM | POA: Diagnosis not present

## 2022-07-12 DIAGNOSIS — I152 Hypertension secondary to endocrine disorders: Secondary | ICD-10-CM

## 2022-07-12 NOTE — Assessment & Plan Note (Signed)
Likely 2/2 HCTZ Was stopped at discharge Recheck BMP

## 2022-07-12 NOTE — Assessment & Plan Note (Signed)
Recent NSTEMI with drug eluting stent placed Doing well currently Needs to schedule appt with Cardiology Allergy to BB listed in chart Plan at discharge to get her set up for cardiac rehab

## 2022-07-12 NOTE — Assessment & Plan Note (Signed)
Well controlled Continue current medications Recheck metabolic panel F/u in 6 months  

## 2022-07-12 NOTE — Telephone Encounter (Signed)
Patient did not accept appt with Cadence Furth on 4/15. That appt was first available. Dr. Donivan Scull first was 7/1. Patient was told to see Dr. Rockey Situ in 10 days from hospital visit.

## 2022-07-15 ENCOUNTER — Telehealth: Payer: Self-pay

## 2022-07-15 NOTE — Telephone Encounter (Signed)
   CCM RN Visit Note   07-15-2022 Name: Jessica Wheeler MRN: [id number]      DOB: 1941/04/04  Subjective: Jessica Wheeler is a 82 y.o. year old female who is a primary care patient of [name], [name], MD. The patient was referred to the Chronic Care Management team for assistance with care management needs subsequent to provider initiation of CCM services and plan of care.      Today's Visit: Engaged with patient by telephone for  patient had called the call center and was concerned about weight loss .      Goals Addressed           This Visit's Progress   CARE PLAN ENTRY       RNCM: Patient concerned about weight loss after HA      Current Barriers:  Care Coordination needs related to incoming call from the patient asking for help about her questions after 4 pound weight loss in a patient with HF and recent MI with stent placement Chronic Disease Management support and education needs related to effective management of HF and post MI  Planned Interventions: Basic overview and discussion of pathophysiology of Heart Failure reviewed. The patient is concerned with weight loss, hacking cough, and shortness of breath. The patient had lost 4 pounds weight loss and was concerned. Reviewed with the patient what worse looks like and to make sure she is staying hydrated. She states that she was just going by her discharge instructions and wanted to make sure she is okay. The patient states she sees the cardiologist in April and will follow up with the care coordination nurse on 07-22-2022. Denies any acute findings this am.  Provided education on low sodium diet Reviewed Heart Failure Action Plan in depth and provided written copy Assessed need for readable accurate scales in home Provided education about placing scale on hard, flat surface Advised patient to weigh each morning after emptying bladder Discussed importance of daily weight and advised patient to weigh and record  daily Reviewed role of diuretics in prevention of fluid overload and management of heart failure Discussed the importance of keeping all appointments with provider Provided patient with education about the role of exercise in the management of heart failure Advised patient to discuss changes in her heart health and when to call for changes in her weight or other sx and sx with provider Screening for signs and symptoms of depression related to chronic disease state  Assessed social determinant of health barriers  Symptom Management: Take medications as prescribed   Attend all scheduled provider appointments Call provider office for new concerns or questions  call the Suicide and Crisis Lifeline: 988 call the [place] National Suicide Prevention Lifeline: 616-566-2137 or TTY: 6840339793 TTY (724) 337-6738) to talk to a trained counselor call 1-800-273-TALK (toll free, 24 hour hotline) if experiencing a Mental Health or [place]  call office if I gain more than 2 pounds in one day or 5 pounds in one week use salt in moderation watch for swelling in feet, ankles and legs every day follow rescue plan if symptoms flare-up  Follow Up Plan: Telephone follow up appointment with care management team member scheduled for: 07-22-2022           Plan:Telephone follow up appointment with care management team member scheduled for:  07-22-2022  Noreene Larsson RN, MSN, CCM RN Care Manager  Chronic Care Management Direct Number: [phone]

## 2022-07-17 ENCOUNTER — Inpatient Hospital Stay
Admission: RE | Admit: 2022-07-17 | Discharge: 2022-07-17 | Disposition: A | Discharge status: Home / Self Care | Payer: Medicare HMO | Source: Ambulatory Visit | Attending: Family Medicine | Admitting: Family Medicine

## 2022-07-18 ENCOUNTER — Telehealth: Payer: Self-pay | Admitting: Cardiovascular Disease

## 2022-07-18 ENCOUNTER — Encounter: Payer: Medicare HMO | Attending: Internal Medicine

## 2022-07-18 ENCOUNTER — Other Ambulatory Visit: Payer: Self-pay

## 2022-07-18 ENCOUNTER — Ambulatory Visit (INDEPENDENT_AMBULATORY_CARE_PROVIDER_SITE_OTHER): Payer: Medicare HMO | Admitting: Physician Assistant

## 2022-07-18 ENCOUNTER — Telehealth: Payer: Self-pay | Admitting: Family Medicine

## 2022-07-18 ENCOUNTER — Ambulatory Visit: Payer: Self-pay | Admitting: *Deleted

## 2022-07-18 VITALS — BP 129/53 | HR 91 | Temp 97.6°F | Wt 137.0 lb

## 2022-07-18 DIAGNOSIS — Z48812 Encounter for surgical aftercare following surgery on the circulatory system: Secondary | ICD-10-CM | POA: Insufficient documentation

## 2022-07-18 DIAGNOSIS — R1032 Left lower quadrant pain: Secondary | ICD-10-CM | POA: Diagnosis not present

## 2022-07-18 DIAGNOSIS — R0602 Shortness of breath: Secondary | ICD-10-CM | POA: Diagnosis not present

## 2022-07-18 DIAGNOSIS — I252 Old myocardial infarction: Secondary | ICD-10-CM | POA: Insufficient documentation

## 2022-07-18 DIAGNOSIS — I214 Non-ST elevation (NSTEMI) myocardial infarction: Secondary | ICD-10-CM

## 2022-07-18 DIAGNOSIS — Z955 Presence of coronary angioplasty implant and graft: Secondary | ICD-10-CM

## 2022-07-18 LAB — POCT URINALYSIS DIPSTICK
Bilirubin, UA: NEGATIVE
Blood, UA: NEGATIVE
Glucose, UA: NEGATIVE
Ketones, UA: NEGATIVE
Leukocytes, UA: NEGATIVE
Nitrite, UA: NEGATIVE
Protein, UA: NEGATIVE
Spec Grav, UA: 1.02 (ref 1.010–1.025)
Urobilinogen, UA: 0.2 E.U./dL
pH, UA: 6 (ref 5.0–8.0)

## 2022-07-18 NOTE — Telephone Encounter (Signed)
Increased shortness of breath. She went to brilinta and after taking that medication she notices increased shortness of breath. She reports that this shortness of breath has gotten progressively worse and she wants help.   Advised that I would send this to Dr. Rockey Situ in regards to the Brilinta and her progressive shortness of breath.    She has also been experiencing sharp radiating pains to the left side of her belly. Reports she is also having dark tarry stools. Instructed patient to reach out to her primary care provider. She states having appointment later today. Encouraged her to discuss the abdominal pain and black tarry stools with her PCP.   Recommended she follow up with her PCP for the below symptoms.

## 2022-07-18 NOTE — Progress Notes (Unsigned)
Established patient visit   Patient: Jessica Wheeler   DOB: 1941-04-18   82 y.o. Female  MRN: [id number] Visit Date: 07/18/2022  Today's healthcare provider: Mardene Speak, PA-C  CC: SOB and abdominal pain since 07/11/22  Subjective    HPI  Patient was hospitalized for MI on 07/07/22.  She was discharged on 07/11/22 on Brilinta and since that time she has noticed dyspnea on exertion, dark stools and abdominal pain. She states that while in the hospital she was on Lovenox and was walking up and down the halls with no issues.  She did not start having any symptoms until being put on the Brilinta.   Medications: Outpatient Medications Prior to Visit  Medication Sig   Accu-Chek FastClix Lancets MISC TEST FASTING BLOOD SUGAR EVERY DAY   aspirin 81 MG EC tablet Take 81 mg by mouth daily.   atorvastatin (LIPITOR) 80 MG tablet Take 1 tablet (80 mg total) by mouth at bedtime.   Blood Glucose Monitoring Suppl (ACCU-CHEK GUIDE) w/Device KIT USE AS DIRECTED TO CHECK FASTING BLOOD SUGAR DAILY   cyclobenzaprine (FLEXERIL) 5 MG tablet Take ONE tab PO BID/TID PRN   glucose blood (ACCU-CHEK GUIDE) test strip TEST FASTING BLOOD SUGAR EVERY DAY AS DIRECTED   Lancets Misc. (ACCU-CHEK FASTCLIX LANCET) KIT 1 each by Does not apply route as directed.   latanoprost (XALATAN) 0.005 % ophthalmic solution Place 1 drop into both eyes at bedtime.    losartan (COZAAR) 25 MG tablet Take 1 tablet (25 mg total) by mouth daily.   Magnesium 250 MG TABS Take 250 mg by mouth daily.   metFORMIN (GLUCOPHAGE) 500 MG tablet TAKE 2 TABLETS TWICE DAILY WITH MEALS   SHINGRIX injection    ticagrelor (BRILINTA) 90 MG TABS tablet Take 1 tablet (90 mg total) by mouth 2 (two) times daily.   vitamin B-12 (CYANOCOBALAMIN) 1000 MCG tablet Take 1,000 mcg by mouth daily.   No facility-administered medications prior to visit.    Review of Systems  Gastrointestinal:  Positive for abdominal pain and diarrhea. Negative for blood in  stool, constipation, nausea and vomiting.       Dark stool        Objective    BP (!) 129/53 (BP Location: Right Arm, Patient Position: Sitting, Cuff Size: Normal)   Pulse 91   Temp 97.6 F (36.4 C) (Oral)   Wt 137 lb (62.1 kg)   SpO2 100%   BMI 24.27 kg/m    Physical Exam Vitals reviewed.  Constitutional:      General: She is not in acute distress.    Appearance: Normal appearance. She is well-developed and normal weight. She is not diaphoretic.  HENT:     Head: Normocephalic and atraumatic.     Nose: Nose normal.  Eyes:     General: No scleral icterus.       Right eye: No discharge.        Left eye: No discharge.     Extraocular Movements: Extraocular movements intact.     Conjunctiva/sclera: Conjunctivae normal.     Pupils: Pupils are equal, round, and reactive to light.  Neck:     Thyroid: No thyromegaly.  Cardiovascular:     Rate and Rhythm: Normal rate and regular rhythm.     Pulses: Normal pulses.     Heart sounds: Normal heart sounds. No murmur heard. Pulmonary:     Effort: Pulmonary effort is normal. No respiratory distress.     Breath sounds:  Normal breath sounds. No wheezing, rhonchi or rales.  Abdominal:     General: Abdomen is flat. Bowel sounds are normal. There is distension.     Palpations: Abdomen is soft.     Tenderness: There is abdominal tenderness (Left Lower quadrant). There is no right CVA tenderness, left CVA tenderness, guarding or rebound.     Hernia: No hernia is present.  Musculoskeletal:     Cervical back: Neck supple.     Right lower leg: No edema.     Left lower leg: No edema.  Lymphadenopathy:     Cervical: No cervical adenopathy.  Skin:    General: Skin is warm and dry.     Findings: No rash.  Neurological:     Mental Status: She is alert and oriented to person, place, and time. Mental status is at baseline.  Psychiatric:        Behavior: Behavior normal.        Thought Content: Thought content normal.      No results  found for any visits on 07/18/22.  Assessment & Plan     1. Left lower quadrant abdominal pain X after start taking Brilinta Mild, sharp pain Reports melena Ordered: - CBC with Differential/Platelet - POCT urinalysis dipstick negative Provided  to perform FIT at home, per pt request Colonoscopy was done in 2018, diverticulosis in sigmoid otherwise normal Will FU after receiving results of her bloodwork  2. Shortness of breath New problem, worsening? 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 She has been doing fine between the episodes Consulted Dr. Rockey Situ, pt's cardiologist, pt will be switched to Plavix loading dose, then 75 mg daily on 07/18/22 Consulted Dr. Saunders Revel, pt was advised to switched to Plavix and if c/o melena, worsening SOB, dropping hemoglobin from 11.8 to 10.5/results were available on 07/19/22/ to proceed to ER Contacted patient on 07/19/22 and advised to proceed to ER. Continue ASA Pt was scheduled to see Gerrie Nordmann on 08/10/22 Will FU with her PCP as scheduled The patient was advised to call back or seek an in-person evaluation if the symptoms worsen or if the condition fails to improve as anticipated.  No follow-ups on file.     I discussed the assessment and treatment plan with the patient. The patient was provided an opportunity to ask questions and all were answered. The patient agreed with the plan and demonstrated an understanding of the instructions.  I, Mardene Speak, PA-C have reviewed all documentation for this visit. The documentation on  07/18/22 for the exam, diagnosis, procedures, and orders are all accurate and complete.  Mardene Speak, [facility],  [place] [phone] (phone) [phone] (fax)  [place]

## 2022-07-18 NOTE — Progress Notes (Signed)
Virtual Visit completed. Patient informed on EP and RD appointment and 6 Minute walk test. Patient also informed of patient health questionnaires on My Chart. Patient Verbalizes understanding. Visit diagnosis can be found in [facility] 07/07/2022.  

## 2022-07-18 NOTE — Telephone Encounter (Signed)
  Chief Complaint: Abdominal pain, SOB Symptoms: Abdominal "Shooting" pain from navel to left side, onset Monday, worsening. Varies in intensity and "Where it's coming from." Dark stools Wednesday "Thought because I ate blueberries."  SOB with exertion. On new blood thinner Brilinta 90 mg S/P hospital admit.07/07/22 NSTEMI Frequency: Last Wednesday Pertinent Negatives: Patient denies dizziness Disposition: [] ED /[] Urgent Care (no appt availability in office) / [x] Appointment(In office/virtual)/ []  [place] [facility]/ [] Home Care/ [] Refused Recommended Disposition /[]  Mobile Bus/ []  Follow-up with PCP Additional Notes: Pt states coming in for blood work this afternoon. Able to secure appt at 3:00 with Janna. Questioning if any additional labs should be ordered prior to lab appt. Advised pt to alert cardiologist as well. States will do so. Advised ED for worsening symptoms. Verbalizes understanding.  Reason for Disposition  [1] MILD-MODERATE pain AND [2] constant AND [3] present > 2 hours  Answer Assessment - Initial Assessment Questions 1. LOCATION: "Where does it hurt?"      Navel mostly to left side 2. RADIATION: "Does the pain shoot anywhere else?" (e.g., chest, back)     To left at times 3. ONSET: "When did the pain begin?" (e.g., minutes, hours or days ago)      Last Monday 4. SUDDEN: "Gradual or sudden onset?"      5. PATTERN "Does the pain come and go, or is it constant?"    - If it comes and goes: "How long does it last?" "Do you have pain now?"     (Note: Comes and goes means the pain is intermittent. It goes away completely between bouts.)    - If constant: "Is it getting better, staying the same, or getting worse?"      (Note: Constant means the pain never goes away completely; most serious pain is constant and gets worse.)      "Shooting pain" 6. SEVERITY: "How bad is the pain?"  (e.g., Scale 1-10; mild, moderate, or severe)    - MILD (1-3): Doesn't interfere with  normal activities, abdomen soft and not tender to touch.     - MODERATE (4-7): Interferes with normal activities or awakens from sleep, abdomen tender to touch.     - SEVERE (8-10): Excruciating pain, doubled over, unable to do any normal activities.       Shooting sharp 7. RECURRENT SYMPTOM: "Have you ever had this type of stomach pain before?" If Yes, ask: "When was the last time?" and "What happened that time?"      No 8. CAUSE: "What do you think is causing the stomach pain?"     Unsure 9. RELIVING/AGGRAVATING FACTORS: "What makes it better or worse?" (e.g., antacids, bending or twisting motion, bowel movement)      10. OTHER SYMPTOMS: "Do you have any other symptoms?" (e.g., back pain, diarrhea, fever, urination pain, vomiting)       Dark stools (Had dark stools few months ago.), SOB with minimal exertion  Protocols used: Abdominal Pain - Female-A-AH

## 2022-07-18 NOTE — Telephone Encounter (Signed)
Pt c/o Shortness Of Breath: STAT if SOB developed within the last 24 hours or pt is noticeably SOB on the phone  1. Are you currently SOB (can you hear that pt is SOB on the phone)? Yes   2. How long have you been experiencing SOB? Since started taking Brilinta  3. Are you SOB when sitting or when up moving around? Both   4. Are you currently experiencing any other symptoms? Dark stools

## 2022-07-19 ENCOUNTER — Encounter: Payer: Self-pay | Admitting: Physician Assistant

## 2022-07-19 ENCOUNTER — Encounter: Payer: Self-pay | Admitting: Emergency Medicine

## 2022-07-19 ENCOUNTER — Other Ambulatory Visit: Payer: Self-pay

## 2022-07-19 ENCOUNTER — Emergency Department: Payer: Medicare HMO

## 2022-07-19 ENCOUNTER — Inpatient Hospital Stay
Admission: EM | Admit: 2022-07-19 | Discharge: 2022-07-21 | DRG: 377 | Disposition: A | Discharge status: Home / Self Care | Payer: Medicare HMO | Attending: Internal Medicine | Admitting: Internal Medicine

## 2022-07-19 DIAGNOSIS — Z90710 Acquired absence of both cervix and uterus: Secondary | ICD-10-CM

## 2022-07-19 DIAGNOSIS — I252 Old myocardial infarction: Secondary | ICD-10-CM | POA: Diagnosis present

## 2022-07-19 DIAGNOSIS — Z955 Presence of coronary angioplasty implant and graft: Secondary | ICD-10-CM

## 2022-07-19 DIAGNOSIS — I429 Cardiomyopathy, unspecified: Secondary | ICD-10-CM | POA: Diagnosis not present

## 2022-07-19 DIAGNOSIS — Z8261 Family history of arthritis: Secondary | ICD-10-CM

## 2022-07-19 DIAGNOSIS — Z888 Allergy status to other drugs, medicaments and biological substances status: Secondary | ICD-10-CM

## 2022-07-19 DIAGNOSIS — I10 Essential (primary) hypertension: Secondary | ICD-10-CM | POA: Diagnosis not present

## 2022-07-19 DIAGNOSIS — I420 Dilated cardiomyopathy: Secondary | ICD-10-CM | POA: Diagnosis not present

## 2022-07-19 DIAGNOSIS — Z882 Allergy status to sulfonamides status: Secondary | ICD-10-CM | POA: Diagnosis not present

## 2022-07-19 DIAGNOSIS — I5022 Chronic systolic (congestive) heart failure: Secondary | ICD-10-CM | POA: Diagnosis not present

## 2022-07-19 DIAGNOSIS — K921 Melena: Secondary | ICD-10-CM | POA: Diagnosis not present

## 2022-07-19 DIAGNOSIS — R0609 Other forms of dyspnea: Secondary | ICD-10-CM

## 2022-07-19 DIAGNOSIS — Z9841 Cataract extraction status, right eye: Secondary | ICD-10-CM | POA: Diagnosis not present

## 2022-07-19 DIAGNOSIS — Z961 Presence of intraocular lens: Secondary | ICD-10-CM | POA: Diagnosis present

## 2022-07-19 DIAGNOSIS — H409 Unspecified glaucoma: Secondary | ICD-10-CM | POA: Diagnosis present

## 2022-07-19 DIAGNOSIS — I5043 Acute on chronic combined systolic (congestive) and diastolic (congestive) heart failure: Secondary | ICD-10-CM | POA: Diagnosis present

## 2022-07-19 DIAGNOSIS — Z8249 Family history of ischemic heart disease and other diseases of the circulatory system: Secondary | ICD-10-CM

## 2022-07-19 DIAGNOSIS — Z7984 Long term (current) use of oral hypoglycemic drugs: Secondary | ICD-10-CM

## 2022-07-19 DIAGNOSIS — Z9049 Acquired absence of other specified parts of digestive tract: Secondary | ICD-10-CM

## 2022-07-19 DIAGNOSIS — R0602 Shortness of breath: Secondary | ICD-10-CM | POA: Diagnosis not present

## 2022-07-19 DIAGNOSIS — I5023 Acute on chronic systolic (congestive) heart failure: Secondary | ICD-10-CM

## 2022-07-19 DIAGNOSIS — Z981 Arthrodesis status: Secondary | ICD-10-CM

## 2022-07-19 DIAGNOSIS — I4510 Unspecified right bundle-branch block: Secondary | ICD-10-CM | POA: Diagnosis present

## 2022-07-19 DIAGNOSIS — E119 Type 2 diabetes mellitus without complications: Secondary | ICD-10-CM

## 2022-07-19 DIAGNOSIS — D62 Acute posthemorrhagic anemia: Secondary | ICD-10-CM | POA: Diagnosis not present

## 2022-07-19 DIAGNOSIS — Z803 Family history of malignant neoplasm of breast: Secondary | ICD-10-CM

## 2022-07-19 DIAGNOSIS — I110 Hypertensive heart disease with heart failure: Secondary | ICD-10-CM | POA: Diagnosis present

## 2022-07-19 DIAGNOSIS — I214 Non-ST elevation (NSTEMI) myocardial infarction: Secondary | ICD-10-CM | POA: Diagnosis present

## 2022-07-19 DIAGNOSIS — E785 Hyperlipidemia, unspecified: Secondary | ICD-10-CM | POA: Diagnosis not present

## 2022-07-19 DIAGNOSIS — Z9842 Cataract extraction status, left eye: Secondary | ICD-10-CM | POA: Diagnosis not present

## 2022-07-19 DIAGNOSIS — I2510 Atherosclerotic heart disease of native coronary artery without angina pectoris: Secondary | ICD-10-CM | POA: Diagnosis not present

## 2022-07-19 DIAGNOSIS — Z87891 Personal history of nicotine dependence: Secondary | ICD-10-CM | POA: Diagnosis not present

## 2022-07-19 DIAGNOSIS — Z79899 Other long term (current) drug therapy: Secondary | ICD-10-CM | POA: Diagnosis not present

## 2022-07-19 DIAGNOSIS — D649 Anemia, unspecified: Secondary | ICD-10-CM

## 2022-07-19 DIAGNOSIS — Z818 Family history of other mental and behavioral disorders: Secondary | ICD-10-CM

## 2022-07-19 DIAGNOSIS — Z7902 Long term (current) use of antithrombotics/antiplatelets: Secondary | ICD-10-CM

## 2022-07-19 DIAGNOSIS — Z1152 Encounter for screening for COVID-19: Secondary | ICD-10-CM | POA: Diagnosis not present

## 2022-07-19 DIAGNOSIS — Z7982 Long term (current) use of aspirin: Secondary | ICD-10-CM

## 2022-07-19 DIAGNOSIS — K922 Gastrointestinal hemorrhage, unspecified: Secondary | ICD-10-CM | POA: Diagnosis not present

## 2022-07-19 DIAGNOSIS — Z800 Family history of malignant neoplasm of digestive organs: Secondary | ICD-10-CM

## 2022-07-19 DIAGNOSIS — Z833 Family history of diabetes mellitus: Secondary | ICD-10-CM

## 2022-07-19 DIAGNOSIS — E1169 Type 2 diabetes mellitus with other specified complication: Secondary | ICD-10-CM | POA: Diagnosis not present

## 2022-07-19 LAB — CBC WITH DIFFERENTIAL/PLATELET
Abs Immature Granulocytes: 0.04 10*3/uL (ref 0.00–0.07)
Basophils Absolute: 0.1 10*3/uL (ref 0.0–0.2)
Basophils Absolute: 0.1 10*3/uL (ref 0.0–0.1)
Basophils Relative: 1 %
Basos: 1 %
EOS (ABSOLUTE): 0.2 10*3/uL (ref 0.0–0.4)
Eos: 2 %
Eosinophils Absolute: 0.1 10*3/uL (ref 0.0–0.5)
Eosinophils Relative: 1 %
HCT: 29.8 % — ABNORMAL LOW (ref 36.0–46.0)
Hematocrit: 30.2 % — ABNORMAL LOW (ref 34.0–46.6)
Hemoglobin: 10.5 g/dL — ABNORMAL LOW (ref 11.1–15.9)
Hemoglobin: 10.2 g/dL — ABNORMAL LOW (ref 12.0–15.0)
Immature Grans (Abs): 0 10*3/uL (ref 0.0–0.1)
Immature Granulocytes: 1 %
Immature Granulocytes: 1 %
Lymphocytes Absolute: 2.4 10*3/uL (ref 0.7–3.1)
Lymphocytes Relative: 23 %
Lymphs Abs: 1.8 10*3/uL (ref 0.7–4.0)
Lymphs: 29 %
MCH: 32.5 pg (ref 26.6–33.0)
MCH: 31.2 pg (ref 26.0–34.0)
MCHC: 34.8 g/dL (ref 31.5–35.7)
MCHC: 34.2 g/dL (ref 30.0–36.0)
MCV: 94 fL (ref 79–97)
MCV: 91.1 fL (ref 80.0–100.0)
Monocytes Absolute: 0.8 10*3/uL (ref 0.1–0.9)
Monocytes Absolute: 0.6 10*3/uL (ref 0.1–1.0)
Monocytes Relative: 7 %
Monocytes: 9 %
Neutro Abs: 5.3 10*3/uL (ref 1.7–7.7)
Neutrophils Absolute: 4.8 10*3/uL (ref 1.4–7.0)
Neutrophils Relative %: 67 %
Neutrophils: 58 %
Platelets: 356 10*3/uL (ref 150–450)
Platelets: 303 10*3/uL (ref 150–400)
RBC: 3.23 x10E6/uL — ABNORMAL LOW (ref 3.77–5.28)
RBC: 3.27 MIL/uL — ABNORMAL LOW (ref 3.87–5.11)
RDW: 12.9 % (ref 11.7–15.4)
RDW: 13.7 % (ref 11.5–15.5)
WBC: 8.2 10*3/uL (ref 3.4–10.8)
WBC: 8 10*3/uL (ref 4.0–10.5)
nRBC: 0 % (ref 0.0–0.2)

## 2022-07-19 LAB — RESP PANEL BY RT-PCR (RSV, FLU A&B, COVID)  RVPGX2
Influenza A by PCR: NEGATIVE
Influenza B by PCR: NEGATIVE
Resp Syncytial Virus by PCR: NEGATIVE
SARS Coronavirus 2 by RT PCR: NEGATIVE

## 2022-07-19 LAB — BASIC METABOLIC PANEL
Anion gap: 12 (ref 5–15)
BUN/Creatinine Ratio: 16 (ref 12–28)
BUN: 13 mg/dL (ref 8–27)
BUN: 14 mg/dL (ref 8–23)
CO2: 19 mmol/L — ABNORMAL LOW (ref 20–29)
CO2: 22 mmol/L (ref 22–32)
Calcium: 9.6 mg/dL (ref 8.7–10.3)
Calcium: 9.3 mg/dL (ref 8.9–10.3)
Chloride: 98 mmol/L (ref 96–106)
Chloride: 98 mmol/L (ref 98–111)
Creatinine, Ser: 0.82 mg/dL (ref 0.57–1.00)
Creatinine, Ser: 0.71 mg/dL (ref 0.44–1.00)
GFR, Estimated: >60 mL/min (ref >60)
Glucose, Bld: 126 mg/dL — ABNORMAL HIGH (ref 70–99)
Glucose: 124 mg/dL — ABNORMAL HIGH (ref 70–99)
Potassium: 4 mmol/L (ref 3.5–5.2)
Potassium: 4.4 mmol/L (ref 3.5–5.1)
Sodium: 137 mmol/L (ref 134–144)
Sodium: 132 mmol/L — ABNORMAL LOW (ref 135–145)
eGFR: 71 mL/min/{1.73_m2} (ref >59)

## 2022-07-19 LAB — BRAIN NATRIURETIC PEPTIDE: B Natriuretic Peptide: 730.6 pg/mL — ABNORMAL HIGH (ref 0.0–100.0)

## 2022-07-19 LAB — HEMOGLOBIN AND HEMATOCRIT, BLOOD
HCT: 30.8 % — ABNORMAL LOW (ref 36.0–46.0)
HCT: 30.2 % — ABNORMAL LOW (ref 36.0–46.0)
Hemoglobin: 10.4 g/dL — ABNORMAL LOW (ref 12.0–15.0)
Hemoglobin: 10.6 g/dL — ABNORMAL LOW (ref 12.0–15.0)

## 2022-07-19 LAB — GLUCOSE, CAPILLARY
Glucose-Capillary: 110 mg/dL — ABNORMAL HIGH (ref 70–99)
Glucose-Capillary: 112 mg/dL — ABNORMAL HIGH (ref 70–99)

## 2022-07-19 LAB — TROPONIN I (HIGH SENSITIVITY)
Troponin I (High Sensitivity): 48 ng/L — ABNORMAL HIGH (ref <18)
Troponin I (High Sensitivity): 58 ng/L — ABNORMAL HIGH (ref <18)

## 2022-07-19 MED ORDER — CYCLOBENZAPRINE HCL 10 MG PO TABS
5.0000 mg | ORAL_TABLET | Freq: Three times a day (TID) | ORAL | Status: DC | PRN
  Administered 2022-07-19 – 2022-07-21 (×2): 5 mg via ORAL
  Filled 2022-07-19 (×2): qty 1

## 2022-07-19 MED ORDER — ONDANSETRON HCL 4 MG PO TABS: 4.0000 mg | ORAL_TABLET | Freq: Four times a day (QID) | ORAL | Status: DC | PRN

## 2022-07-19 MED ORDER — FUROSEMIDE 10 MG/ML IJ SOLN
20.0000 mg | Freq: Once | INTRAMUSCULAR | Status: AC
  Administered 2022-07-19: 20 mg via INTRAVENOUS
  Filled 2022-07-19: qty 4

## 2022-07-19 MED ORDER — ATORVASTATIN CALCIUM 20 MG PO TABS
80.0000 mg | ORAL_TABLET | Freq: Every day | ORAL | Status: DC
  Administered 2022-07-19 – 2022-07-20 (×2): 80 mg via ORAL
  Filled 2022-07-19 (×2): qty 4

## 2022-07-19 MED ORDER — PANTOPRAZOLE SODIUM 40 MG IV SOLR
40.0000 mg | Freq: Once | INTRAVENOUS | Status: AC
  Administered 2022-07-19: 40 mg via INTRAVENOUS
  Filled 2022-07-19: qty 10

## 2022-07-19 MED ORDER — LOSARTAN POTASSIUM 25 MG PO TABS
25.0000 mg | ORAL_TABLET | Freq: Every day | ORAL | Status: DC
  Administered 2022-07-20 – 2022-07-21 (×2): 25 mg via ORAL
  Filled 2022-07-19 (×3): qty 1

## 2022-07-19 MED ORDER — CLOPIDOGREL BISULFATE 75 MG PO TABS
75.0000 mg | ORAL_TABLET | Freq: Every day | ORAL | Status: DC
  Administered 2022-07-20 – 2022-07-21 (×2): 75 mg via ORAL
  Filled 2022-07-19 (×2): qty 1

## 2022-07-19 MED ORDER — ONDANSETRON HCL 4 MG/2ML IJ SOLN: 4.0000 mg | Freq: Four times a day (QID) | INTRAMUSCULAR | Status: DC | PRN

## 2022-07-19 MED ORDER — IOHEXOL 350 MG/ML SOLN
75.0000 mL | Freq: Once | INTRAVENOUS | Status: AC | PRN
  Administered 2022-07-19: 75 mL via INTRAVENOUS

## 2022-07-19 MED ORDER — CLOPIDOGREL BISULFATE 75 MG PO TABS
300.0000 mg | ORAL_TABLET | Freq: Once | ORAL | Status: AC
  Administered 2022-07-19: 300 mg via ORAL
  Filled 2022-07-19: qty 4

## 2022-07-19 MED ORDER — PANTOPRAZOLE INFUSION (NEW) - SIMPLE MED
8.0000 mg/h | INTRAVENOUS | Status: DC
  Administered 2022-07-19 – 2022-07-21 (×4): 8 mg/h via INTRAVENOUS
  Filled 2022-07-19 (×4): qty 100

## 2022-07-19 MED ORDER — INSULIN ASPART 100 UNIT/ML IJ SOLN
0.0000 [IU] | INTRAMUSCULAR | Status: DC
  Administered 2022-07-20: 2 [IU] via SUBCUTANEOUS
  Administered 2022-07-20 – 2022-07-21 (×4): 1 [IU] via SUBCUTANEOUS
  Filled 2022-07-19 (×5): qty 1

## 2022-07-19 MED ORDER — ASPIRIN 81 MG PO TBEC
81.0000 mg | DELAYED_RELEASE_TABLET | Freq: Every day | ORAL | Status: DC
  Administered 2022-07-20 – 2022-07-21 (×2): 81 mg via ORAL
  Filled 2022-07-19 (×3): qty 1

## 2022-07-19 MED ORDER — SODIUM CHLORIDE 0.9 % IV SOLN: INTRAVENOUS | Status: DC

## 2022-07-19 MED ORDER — CLOPIDOGREL BISULFATE 75 MG PO TABS: ORAL_TABLET | ORAL | 0 refills | Status: DC

## 2022-07-19 NOTE — Consult Note (Signed)
Cardiology Consultation   Patient ID: IZAMARY FAILS MRN: [id number]; DOB: 07/01/1940  Admit date: 07/19/2022 Date of Consult: 07/19/2022  PCP:  Virginia Crews, MD   [facility] Prov[facility] Cardiologist:  Ida Rogue, MD   Patient Profile:   Jessica Wheeler is a 82 y.o. female with a hx of coronary artery disease with recent hospitalization for NSTEMI complicated by acute HFrEF status post PCI to the mid LAD, hypertension, hyperlipidemia, type 2 diabetes mellitus, aortic dissection, renal artery stenosis, pancreatitis, and chronic hyponatremia who is being seen 07/19/2022 for the evaluation of melena and heart failure at the request of Dr. Ernestina Patches.  History of Present Illness:   Ms. Retallick was hospitalized earlier this month for NSTEMI.  LVEF was reduced at 35-40%.  She underwent cardiac catheterization that demonstrated multivessel CAD.  Culprit lesion was severe mid LAD stenosis that was successfully treated with DES x 1.  She was discharged on aspirin and ticagrelor.  She was noted to have moderately elevated left heart filling pressures during the catheterization.  She was diuresed immediately following the procedure but was not discharged on any standing diuretic therapy.  Two days after hospital discharge, she began to notice dark stools accompanied by left lower quadrant pain.  She had also noted progressive shortness of breath and pressure in her chest.  She saw her PCP yesterday and was noted to have worsening anemia.  After consultation with our office, she was advised to go to the ER for further evaluation.  At this time, Ms. Barasch is feeling better.  Shortness of breath and chest pressure have improved following a dose of IV furosemide.  She has noted to be Hemoccult positive in the ER.  She last took ticagrelor yesterday morning.   Past Medical History:  Diagnosis Date   Allergy    Arthritis    Bundle branch block, right    C7 cervical fracture (HCC)     Diabetes mellitus    Diffuse cystic mastopathy    Dissection, aorta (HCC)    Dr.Dew follows   Diverticulosis    Family history of malignant neoplasm of gastrointestinal tract    Glaucoma 2005   Heart murmur    History of bronchitis    History of pancreatitis 2005   Hyperlipidemia    Hypertension 1982   Personal history of tobacco use, presenting hazards to health    RBBB    Ulcer     Past Surgical History:  Procedure Laterality Date   ABDOMINAL HYSTERECTOMY  1991   ANTERIOR CERVICAL CORPECTOMY N/A 03/30/2020   Procedure: CORPECTOMY CERVICAL FOUR - CERVICAL FIVE;  Surgeon: Newman Pies, MD;  Location: [facility];  Service: Neurosurgery;  Laterality: N/A;  anterior   APPENDECTOMY     BACK SURGERY  1982   for lumbar ruptured disc. No hardware, no discectomy, no fusion   BLADDER SURGERY     BREAST BIOPSY Right 1980's   benign   BREAST EXCISIONAL BIOPSY Right 1980s   benign   BREAST SURGERY     biopsy   CARDIAC CATHETERIZATION     Dr. Ubaldo Glassing, few years ago   CATARACT EXTRACTION W/PHACO Left 08/31/2015   Procedure: CATARACT EXTRACTION PHACO AND INTRAOCULAR LENS PLACEMENT ([place]);  Surgeon: Ronnell Freshwater, MD;  Location: [facility];  Service: Ophthalmology;  Laterality: Left;  DIABETIC - oral meds   CATARACT EXTRACTION W/PHACO Right 10/12/2015   Procedure: CATARACT EXTRACTION PHACO AND INTRAOCULAR LENS PLACEMENT ([place]) right eye;  Surgeon: Rodena Piety  Thomes Lolling, MD;  Location: [facility];  Service: Ophthalmology;  Laterality: Right;  DIABETIC - oral meds   CHOLECYSTECTOMY  1994   COLONOSCOPY  2008,02/22/2012   Dr. Sankar-2013   COLONOSCOPY WITH PROPOFOL N/A 04/04/2017   Procedure: COLONOSCOPY WITH PROPOFOL;  Surgeon: Christene Lye, MD;  Location: [facility];  Service: Endoscopy;  Laterality: N/A;   CORONARY STENT INTERVENTION N/A 07/08/2022   Procedure: CORONARY STENT INTERVENTION;  Surgeon: Nelva Bush, MD;  Location: [facility];   Service: Cardiovascular;  Laterality: N/A;   POSTERIOR CERVICAL FUSION/FORAMINOTOMY N/A 04/01/2020   Procedure: POSTERIOR CERVICAL FUSION/FORAMINOTOMY CERVICAL FOUR- CERVICAL SIX;  Surgeon: Newman Pies, MD;  Location: [facility];  Service: Neurosurgery;  Laterality: N/A;   REFRACTIVE SURGERY     RIGHT/LEFT HEART CATH AND CORONARY ANGIOGRAPHY N/A 07/08/2022   Procedure: RIGHT/LEFT HEART CATH AND CORONARY ANGIOGRAPHY;  Surgeon: Nelva Bush, MD;  Location: [facility];  Service: Cardiovascular;  Laterality: N/A;      Inpatient Medications: Scheduled Meds:  aspirin EC  81 mg Oral Daily   atorvastatin  80 mg Oral QHS   insulin aspart  0-9 Units Subcutaneous Q4H   losartan  25 mg Oral Daily   Continuous Infusions:  sodium chloride 50 mL/hr at 07/19/22 1705   pantoprazole 8 mg/hr (07/19/22 1712)   PRN Meds: cyclobenzaprine, ondansetron **OR** ondansetron (ZOFRAN) IV  Allergies:    Allergies  Allergen Reactions   Beta Adrenergic Blockers     Chest pressure/difficulty breathing   Jardiance [Empagliflozin]     Muscle cramps   Levaquin [Levofloxacin In D5w]     Low blood sugar; does take this when she needs it. She just makes sure to control her BS to avoid hypoglycemia.   Lisinopril Cough   Zetia [Ezetimibe]     Lethargic    Prevnar [Pneumococcal 13-Val Conj Vacc] Rash    Joint Pain   Sulfa Antibiotics Rash    "makes my skin raw"    Social History:   Social History   Socioeconomic History   Marital status: Widowed    Spouse name: Not on file   Number of children: 2   Years of education: 14   Highest education level: Associate degree: academic program  Occupational History   Occupation: Retired  Tobacco Use   Smoking status: Former    Packs/day: 1.00    Years: 15.00    Additional pack years: 0.00    Total pack years: 15.00    Types: Cigarettes    Quit date: 08/03/2003    Years since quitting: 18.9   Smokeless tobacco: Never  Vaping Use   Vaping Use: Never  used  Substance and Sexual Activity   Alcohol use: No   Drug use: No   Sexual activity: Not on file  Other Topics Concern   Not on file  Social History Narrative   Not on file   Social Determinants of Health   Financial Resource Strain: Low Risk  (07/14/2021)   Overall Financial Resource Strain (CARDIA)    Difficulty of Paying Living Expenses: Not hard at all  Food Insecurity: No Food Insecurity (07/11/2022)   Hunger Vital Sign    Worried About Running Out of Food in the Last Year: Never true    [place] in the Last Year: Never true  Transportation Needs: No Transportation Needs (07/11/2022)   PRAPARE - Hydrologist (Medical): No    Lack of Transportation (Non-Medical): No  Physical Activity: Sufficiently Active (07/14/2021)   Exercise Vital Sign    Days of Exercise per Week: 4 days    Minutes of Exercise per Session: 40 min  Stress: No Stress Concern Present (07/14/2021)   [place]    Feeling of Stress : Only a little  Social Connections: Moderately Isolated (07/14/2021)   Social Connection and Isolation Panel [NHANES]    Frequency of Communication with Friends and Family: More than three times a week    Frequency of Social Gatherings with Friends and Family: Once a week    Attends Religious Services: More than 4 times per year    Active Member of [employer] or Organizations: No    Attends Archivist Meetings: Never    Marital Status: Widowed  Intimate Partner Violence: Not At Risk (07/08/2022)   Humiliation, Afraid, Rape, and Kick questionnaire    Fear of Current or Ex-Partner: No    Emotionally Abused: No    Physically Abused: No    Sexually Abused: No    Family History:   Family History  Problem Relation Age of Onset   Diabetes Mother    Hypertension Mother    Anxiety disorder Mother    Heart disease Father        rheumatic heart disease   Heart attack Father     Rheum arthritis Father    Colon cancer Brother    Hypertension Maternal Grandmother    Hypertension Maternal Grandfather    Breast cancer Maternal Aunt 60   Breast cancer Cousin 73     ROS:  Please see the history of present illness. All other ROS reviewed and negative.     Physical Exam/Data:   Vitals:   07/19/22 1900 07/19/22 1915 07/19/22 1930 07/19/22 1951  BP:    (!) 166/65  Pulse: 83 81 82 89  Resp: 18 20 17 20   Temp:    97.8 F (36.6 C)  TempSrc:    Oral  SpO2: 97% 93% 92% 100%  Weight:      Height:       No intake or output data in the 24 hours ending 07/19/22 2000    07/19/2022   10:19 AM 07/18/2022    3:20 PM 07/12/2022    2:21 PM  Last 3 Weights  Weight (lbs) 136 lb 137 lb 140 lb  Weight (kg) 61.689 kg 62.143 kg 63.504 kg     Body mass index is 24.09 kg/m.  General:  Well nourished, well developed, in no acute distress.  Caregiver is at the bedside. HEENT: normal Neck: no JVD Vascular: No carotid bruits; Distal pulses 2+ bilaterally Cardiac:  normal S1, S2; RRR; no murmurs Lungs: Mildly diminished breath sounds at the lung bases.  No wheezes or crackles. Abd: soft, nontender, no hepatomegaly  Ext: no edema Musculoskeletal:  No deformities, BUE and BLE strength normal and equal Skin: warm and dry  Neuro:  CNs 2-12 intact, no focal abnormalities noted Psych:  Normal affect   EKG:  The EKG was personally reviewed and demonstrates: Normal sinus rhythm with bifascicular block (RBBB and LPFB).  No significant change from prior tracing on 07/09/2022. Telemetry:  Telemetry was personally reviewed and demonstrates: Normal sinus rhythm  Relevant CV Studies: TTE (07/09/2022):  1. Left ventricular ejection fraction, by estimation, is 35 to 40%. The  left ventricle has moderately decreased function. The left ventricle  demonstrates global hypokinesis, basal wall motion best preserved possibly  consistent with  stress cardioyopathy.   Left ventricular diastolic  parameters are consistent with Grade I  diastolic dysfunction (impaired relaxation).   2. Right ventricular systolic function is normal. The right ventricular  size is normal. Tricuspid regurgitation signal is inadequate for assessing  PA pressure.   3. The mitral valve is normal in structure. Mild mitral valve  regurgitation. No evidence of mitral stenosis.   4. The aortic valve was not well visualized. Aortic valve regurgitation  is not visualized. No aortic stenosis is present.   5. The inferior vena cava is normal in size with greater than 50%  respiratory variability, suggesting right atrial pressure of 3 mmHg.   R/LHC and PCI (07/08/2022): Significant multivessel coronary artery disease, as detailed below.  Culprit lesion for the patient's NSTEMI is a hazy 99% stenosis of the mid LAD with TIMI-1 flow.  In addition, there is moderate disease involving the distal LMCA, proximal/mid LAD, and RCA, as detailed below. Moderately elevated left heart filling pressures (LVEDP 30 mmHg, PCWP 26 mmHg). Mildly-moderately elevated right heart filling pressures (mean RA 11 mmHg, RVEDP 14 mmHg). Normal Fick cardiac output/index (CO 4.6 L/min, CI 2.8 L/min/m). Successful PCI to 99% mid LAD stenosis using Onyx frontier 2.0 x 12 mm drug-eluting stent with 0% residual stenosis and improvement in flow from TIMI-1 to TIMI-2.  Laboratory Data:  High Sensitivity Troponin:   Recent Labs  Lab 07/07/22 1959 07/07/22 2158 07/19/22 1023 07/19/22 1336  TROPONINIHS 502* 6,442* 48* 58*     Chemistry Recent Labs  Lab 07/18/22 1634 07/19/22 1023  NA 137 132*  K 4.0 4.4  CL 98 98  CO2 19* 22  GLUCOSE 124* 126*  BUN 13 14  CREATININE 0.82 0.71  CALCIUM 9.6 9.3  GFRNONAA  --  >60  ANIONGAP  --  12    No results for input(s): "PROT", "ALBUMIN", "AST", "ALT", "ALKPHOS", "BILITOT" in the last 168 hours. Lipids No results for input(s): "CHOL", "TRIG", "HDL", "LABVLDL", "LDLCALC", "CHOLHDL" in the last 168  hours.  Hematology Recent Labs  Lab 07/18/22 1634 07/19/22 1143 07/19/22 1717  WBC 8.2 8.0  --   RBC 3.23* 3.27*  --   HGB 10.5* 10.2* 10.4*  HCT 30.2* 29.8* 30.8*  MCV 94 91.1  --   MCH 32.5 31.2  --   MCHC 34.8 34.2  --   RDW 12.9 13.7  --   PLT 356 303  --    Thyroid No results for input(s): "TSH", "FREET4" in the last 168 hours.  BNP Recent Labs  Lab 07/19/22 1336  BNP 730.6*    DDimer No results for input(s): "DDIMER" in the last 168 hours.   Radiology/Studies:  CT Angio Chest PE W/Cm &/Or Wo Cm  Result Date: 07/19/2022 CLINICAL DATA:  High clinical suspicion for PE EXAM: CT ANGIOGRAPHY CHEST WITH CONTRAST TECHNIQUE: Multidetector CT imaging of the chest was performed using the standard protocol during bolus administration of intravenous contrast. Multiplanar CT image reconstructions and MIPs were obtained to evaluate the vascular anatomy. RADIATION DOSE REDUCTION: This exam was performed according to the departmental dose-optimization program which includes automated exposure control, adjustment of the mA and/or kV according to patient size and/or use of iterative reconstruction technique. CONTRAST:  49mL OMNIPAQUE IOHEXOL 350 MG/ML SOLN COMPARISON:  Previous studies including the chest radiograph done earlier today FINDINGS: Cardiovascular: There are no intraluminal filling defects in pulmonary artery branches. There is homogeneous enhancement in thoracic aorta. There are scattered atherosclerotic plaques and calcifications in thoracic aorta and  its major branches. Coronary artery calcifications are seen. Mediastinum/Nodes: No significant lymphadenopathy is seen. Lungs/Pleura: There is no focal pulmonary consolidation. There is slight increase in interstitial markings in the periphery of the lower lung fields, more so on the left side. Left hemidiaphragm is elevated. There is no pleural effusion or pneumothorax. Upper Abdomen: No acute findings are seen. Musculoskeletal:  Unremarkable. Review of the MIP images confirms the above findings. IMPRESSION: There is no evidence of pulmonary artery embolism. There is no evidence of thoracic aortic dissection. Atherosclerosis. Coronary artery disease. There is no focal pulmonary consolidation. There is no pleural effusion or pneumothorax. Electronically Signed   By: Elmer Picker M.D.   On: 07/19/2022 14:35   DG Chest 2 View  Result Date: 07/19/2022 CLINICAL DATA:  Shortness of breath.  Anticoagulated. EXAM: CHEST - 2 VIEW COMPARISON:  07/07/2022 FINDINGS: Cardiomegaly. Aortic atherosclerosis. Chronic pulmonary scarring. No evidence of active infiltrate, mass, effusion or collapse. Ordinary chronic degenerative changes affect the spine. IMPRESSION: Cardiomegaly. Aortic atherosclerosis. Chronic pulmonary scarring. No acute finding. Electronically Signed   By: Nelson Chimes M.D.   On: 07/19/2022 11:08     Assessment and Plan:   Melena: Primary concern would be for upper GI bleed, though the patient interestingly complains mostly of left lower quadrant pain.  Hemoglobin has trended down since recent hospitalization. -Continue pantoprazole infusion. -Recommend GI consultation. -Continue to trend hemoglobin. -Transfuse for hemoglobin less than 8 or worsening shortness of breath/chest pain.  Coronary artery disease and recent NSTEMI: I suspect chest pressure and shortness of breath present this admission are due to progressive heart failure and worsening anemia.  EKG does not show any acute ischemic changes.  I would also be very reluctant to pursue invasive cardiac procedures at this time in the setting of melena.  However, given drug-eluting stent placement to the LAD less than 2 weeks ago, dual antiplatelet therapy needs to be continued even in the face of GI bleed. -Continue aspirin 81 mg daily. Switch ticagrelor to clopidogrel (load 300 mg x 1 followed by 75 mg daily thereafter). -Continue atorvastatin 80 mg  daily.  Acute on chronic HFrEF: Patient does not appear volume overloaded on exam today but is noted to have elevated BNP.  Echocardiogram during recent hospitalization showed LVEF of 35-40%.  I suspect that she may have reaccumulated volume after being discharged without any standing diuretic therapy.  It should be noted that she had significantly elevated PCWP at the time of her catheterization less than 2 weeks ago. -Maintain slightly negative fluid balance. -Redose of furosemide 20 mg IV this evening. -Hold maintenance IV fluid. -Continue losartan 25 mg daily.  Patient previously intolerant of beta-blockers.  For questions or updates, please contact [place] Please consult [email or web address] for contact info under [facility] Cardiology.  Signed, Nelva Bush, MD  07/19/2022 8:00 PM

## 2022-07-19 NOTE — ED Triage Notes (Signed)
Pt to ED via POV. Pt states that 1 last she had a stent placed and started a blood thinner. Pt states that she has been having dark stool since the Monday after she was discharged from the hospital. Pt states that her shortness of breath has continued to get worse. Pt had blood work yesterday and Hgb was down from when she was in the hospital so her doctor wanted her to come to the ED to be checked for internal bleeding. Pt's color is WNL. Pt does not appear to be in any distress at this time, respirations are equal and unlabored.

## 2022-07-19 NOTE — Assessment & Plan Note (Signed)
Noted new upper GIB in setting of recent NSTEMI on ASA and brillinta  Hemoccult + per report  S/p IV protonix in the ER  Will place on IV protonix gtt  Holding brillinta  Will continue baby ASA per Dr. Darnelle Bos from cardiology as coronary stent is very new  Dr. Vicente Males w/ gastroenterology made aware of case  Pending formal consultation

## 2022-07-19 NOTE — ED Notes (Signed)
Patient transported to CT 

## 2022-07-19 NOTE — Assessment & Plan Note (Addendum)
Admitted for NSTEMI 3/7-3/10 w/ Status postcardiac cath and drug-eluting stent on 3/8  Was placed on asa and brillinta at discharge  + black stools x 3-4 days  Holding brillinta Cont ASA per Dr. Saunders Revel given newness of coronary stent  Trop in 50s today- suspect minimal- mild demand ischemia in setting of acute GIB and mild blood loss  No active CP  Noted peak trop during recent admission at [facility] Follow up formal cardiology recommendations in am

## 2022-07-19 NOTE — Assessment & Plan Note (Signed)
Hgb 10.4 today w/ baseline hgb around 12 in setting of upper GIB  Will trend hgb overnight  Transfuse for hgb <8 in setting of recent STEMI s/p coronary stenting  Anemia panel  Monitor closely

## 2022-07-19 NOTE — ED Provider Notes (Signed)
[facility] Provider Note    Event Date/Time   First MD Initiated Contact with Patient 07/19/22 1303     (approximate)   History   Shortness of Breath   HPI  Jessica Wheeler is a 82 y.o. female   Past medical history of hypertension, hyperlipidemia, glaucoma and type 2 diabetes, osteoarthritis, aortic dissection, right bundle blanch block presents to the emergency department with complaints of dark stool as well as exertional dyspnea, her outpatient doctor wanted her to come to the emergency department given drop in her H&H to check for internal bleeding given that she started on Brilinta recently after hospitalization for NSTEMI.  She states that being home for the last week she initially felt well but has gradually gotten short of breath with exertion and has had some chest pressure tightness feeling.  She denies peripheral edema.  She has also noted dark-colored stools but wonders if this has been due to the blueberries she has been eating.  She has been compliant with her antiplatelet medications which were newly started after her hospitalization and discharged on 07/10/2022.   External Medical Documents Reviewed: Discharge summary from 07/10/2022 for NSTEMI      Physical Exam   Triage Vital Signs: ED Triage Vitals  Enc Vitals Group     BP 07/19/22 1018 (!) 151/62     Pulse Rate 07/19/22 1018 (!) 103     Resp 07/19/22 1018 16     Temp 07/19/22 1018 98 F (36.7 C)     Temp Source 07/19/22 1018 Oral     SpO2 07/19/22 1018 100 %     Weight 07/19/22 1019 136 lb (61.7 kg)     Height 07/19/22 1019 5\' 3"  (1.6 m)     Head Circumference --      Peak Flow --      Pain Score 07/19/22 1019 3     Pain Loc --      Pain Edu? --      Excl. in [place]? --     Most recent vital signs: Vitals:   07/19/22 1018 07/19/22 1330  BP: (!) 151/62 (!) 152/61  Pulse: (!) 103 91  Resp: 16 18  Temp: 98 F (36.7 C) 98.3 F (36.8 C)  SpO2: 100% 99%     General: Awake, no distress.  CV:  Good peripheral perfusion.  Resp:  Normal effort.  Abd:  No distention.  Other:  Awake alert comfortable appearing and pleasant lady laying in the stretcher no acute distress no respiratory distress.  Vital signs significant initially for hypertension and mild tachycardia in the low 100s but normalized on recheck.  No hypoxemia.  Lungs clear without wheezing or focality, no rales.  No obvious peripheral edema and abdomen is soft and nontender skin appears warm well-perfused.  Her rectal exam shows dark stool formed but guaiac positive.   ED Results / Procedures / Treatments   Labs (all labs ordered are listed, but only abnormal results are displayed) Labs Reviewed  BASIC METABOLIC PANEL - Abnormal; Notable for the following components:      Result Value   Sodium 132 (*)    Glucose, Bld 126 (*)    All other components within normal limits  CBC WITH DIFFERENTIAL/PLATELET - Abnormal; Notable for the following components:   RBC 3.27 (*)    Hemoglobin 10.2 (*)    HCT 29.8 (*)    All other components within normal limits  BRAIN NATRIURETIC PEPTIDE - Abnormal; Notable  for the following components:   B Natriuretic Peptide 730.6 (*)    All other components within normal limits  TROPONIN I (HIGH SENSITIVITY) - Abnormal; Notable for the following components:   Troponin I (High Sensitivity) 48 (*)    All other components within normal limits  TROPONIN I (HIGH SENSITIVITY) - Abnormal; Notable for the following components:   Troponin I (High Sensitivity) 58 (*)    All other components within normal limits  RESP PANEL BY RT-PCR (RSV, FLU A&B, COVID)  [id number]  CBC WITH DIFFERENTIAL/PLATELET     I ordered and reviewed the above labs they are notable for hemoglobin is 10.2 from 12 approximately 9 days ago.  BNP is 730 and her troponin rising from 48-58  EKG  ED ECG REPORT I, Lucillie Garfinkel, the attending physician, personally viewed and interpreted this  ECG.   Date: 07/19/2022  EKG Time: 1136  Rate: 90  Rhythm: sinus  Axis: nl  Intervals:lpfb, rbbb  ST&T Change: No STEMI    RADIOLOGY I independently reviewed and interpreted chest x-ray and see no obvious focality or pneumothorax   PROCEDURES:  Critical Care performed: No  Procedures   MEDICATIONS ORDERED IN ED: Medications  furosemide (LASIX) injection 20 mg (has no administration in time range)  pantoprazole (PROTONIX) injection 40 mg (40 mg Intravenous Given 07/19/22 1336)  iohexol (OMNIPAQUE) 350 MG/ML injection 75 mL (75 mLs Intravenous Contrast Given 07/19/22 1404)    External physician / consultants:  I spoke with hospitalist for admission&  regarding care plan for this patient.   IMPRESSION / MDM / ASSESSMENT AND PLAN / ED COURSE  I reviewed the triage vital signs and the nursing notes.                                Patient's presentation is most consistent with acute presentation with potential threat to life or bodily function.  Differential diagnosis includes, but is not limited to, GI bleeding, ACS, PE, heart failure exacerbation, respiratory infection   The patient is on the cardiac monitor to evaluate for evidence of arrhythmia and/or significant heart rate changes.  MDM: This is a patient with recent NSTEMI started on antiplatelet agents now concerns of GI bleeding with a drop of 2 points in the last 9 days from her hemoglobin baseline 12-10.  Guaiac positive stool.  She also has some exertional dyspnea and chest pressure.  She was initially tachycardic and I checked for PE with a CT angiogram which was negative.  She did not appear overtly fluid overloaded during her last hospitalization was diagnosed with heart failure with an EF around 35% but not started on Lasix apparently.  Sure proBNP is elevated no prior for comparison can trial some light diuresis for effect.  Given her drop in H&H in the setting of new antiplatelet agents and guaiac  positive stool is like to admit her for trending of this H&H.  Hemodynamics normal and hemoglobin in the tens no need for emergent blood transfusion at this time.  Her chest pressure would be atypical for ACS but given recent NSTEMI and rising troponin, albeit mild, should have these trended while inpatient.  Given the very small increase, in the setting of suspected GI bleed, I will hold on heparin at this time.          FINAL CLINICAL IMPRESSION(S) / ED DIAGNOSES   Final diagnoses:  Gastrointestinal hemorrhage, unspecified gastrointestinal hemorrhage type  Exertional dyspnea  Anemia, unspecified type     Rx / DC Orders   ED Discharge Orders     None        Note:  This document was prepared using Dragon voice recognition software and may include unintentional dictation errors.    Lucillie Garfinkel, MD 07/19/22 1501

## 2022-07-19 NOTE — Assessment & Plan Note (Signed)
BP stable Titrate home regimen 

## 2022-07-19 NOTE — Assessment & Plan Note (Signed)
Recent 2D ECHO  ejection fraction 35 to 40%  Appears euvolemic  Monitor volume status w/ GIB if pRBC needed Gentle hydration w/ GI losses Strict Is and Os and daily weights  Follow up cardiology recs

## 2022-07-19 NOTE — Assessment & Plan Note (Signed)
SSI

## 2022-07-19 NOTE — H&P (Signed)
History and Physical    Patient: Jessica Wheeler [id number] DOB: [birth year] DOA: 07/19/2022 DOS: the patient was seen and examined on 07/19/2022 PCP: Virginia Crews, MD  Patient coming from: Home  Chief Complaint:  Chief Complaint  Patient presents with   Shortness of Breath   HPI: [name] is a 82 y.o. female with medical history significant of multiple medical issue including hypertension, coronary artery disease, hyperlipidemia, recent NSTEMI presenting with upper GI bleed, acute blood loss anemia.  Patient noted to have been admitted March 7 through March 10 for acute NSTEMI with a peak troponin of around 6400.  Patient underwent cardiac catheterization with placement of drug-eluting stent.  Patient also found to have new onset systolic heart failure with an EF of 35 to 40%.  Patient was discharged on aspirin and Brilinta.  Patient reports from day of discharge has had recurring episodes of black stools.  No abdominal pain, nausea or vomiting.  No chest pain or shortness of breath.  Minimal to mild chest pain.  No fevers or chills.  Has had worsening weakness over the same timeframe.  Has been compliant with home regimen including aspirin and Brilinta. Presented to the ER afebrile, hemodynamically stable.  White count 8, hemoglobin 10.2 with baseline hemoglobin around 12.  Creatinine 0.7.  Troponin 40s to 50s.  BNP is 730.  Chest x-ray with cardiomegaly.  CTA of the chest stable. Review of Systems: As mentioned in the history of present illness. All other systems reviewed and are negative. Past Medical History:  Diagnosis Date   Allergy    Arthritis    Bundle branch block, right    C7 cervical fracture (HCC)    Diabetes mellitus    Diffuse cystic mastopathy    Dissection, aorta (HCC)    Dr.Dew follows   Diverticulosis    Family history of malignant neoplasm of gastrointestinal tract    Glaucoma 2005   Heart murmur    History of bronchitis    History of pancreatitis  2005   Hyperlipidemia    Hypertension 1982   Personal history of tobacco use, presenting hazards to health    RBBB    Ulcer    Past Surgical History:  Procedure Laterality Date   ABDOMINAL HYSTERECTOMY  1991   ANTERIOR CERVICAL CORPECTOMY N/A 03/30/2020   Procedure: CORPECTOMY CERVICAL FOUR - CERVICAL FIVE;  Surgeon: Newman Pies, MD;  Location: [place];  Service: Neurosurgery;  Laterality: N/A;  anterior   APPENDECTOMY     BACK SURGERY  1982   for lumbar ruptured disc. No hardware, no discectomy, no fusion   BLADDER SURGERY     BREAST BIOPSY Right 1980's   benign   BREAST EXCISIONAL BIOPSY Right 1980s   benign   BREAST SURGERY     biopsy   CARDIAC CATHETERIZATION     Dr. Ubaldo Glassing, few years ago   CATARACT EXTRACTION W/PHACO Left 08/31/2015   Procedure: CATARACT EXTRACTION PHACO AND INTRAOCULAR LENS PLACEMENT ([place]);  Surgeon: Ronnell Freshwater, MD;  Location: [place];  Service: Ophthalmology;  Laterality: Left;  DIABETIC - oral meds   CATARACT EXTRACTION W/PHACO Right 10/12/2015   Procedure: CATARACT EXTRACTION PHACO AND INTRAOCULAR LENS PLACEMENT ([place]) right eye;  Surgeon: Ronnell Freshwater, MD;  Location: [place];  Service: Ophthalmology;  Laterality: Right;  DIABETIC - oral meds   CHOLECYSTECTOMY  1994   COLONOSCOPY  2008,02/22/2012   Dr. Sankar-2013   COLONOSCOPY WITH PROPOFOL N/A 04/04/2017   Procedure:  COLONOSCOPY WITH PROPOFOL;  Surgeon: Christene Lye, MD;  Location: [facility] ENDOSCOPY;  Service: Endoscopy;  Laterality: N/A;   CORONARY STENT INTERVENTION N/A 07/08/2022   Procedure: CORONARY STENT INTERVENTION;  Surgeon: Nelva Bush, MD;  Location: [place] [facility];  Service: Cardiovascular;  Laterality: N/A;   POSTERIOR CERVICAL FUSION/FORAMINOTOMY N/A 04/01/2020   Procedure: POSTERIOR CERVICAL FUSION/FORAMINOTOMY CERVICAL FOUR- CERVICAL SIX;  Surgeon: Newman Pies, MD;  Location: [place];  Service: Neurosurgery;  Laterality:  N/A;   REFRACTIVE SURGERY     RIGHT/LEFT HEART CATH AND CORONARY ANGIOGRAPHY N/A 07/08/2022   Procedure: RIGHT/LEFT HEART CATH AND CORONARY ANGIOGRAPHY;  Surgeon: Nelva Bush, MD;  Location: [place] [facility];  Service: Cardiovascular;  Laterality: N/A;   Social History:  reports that she quit smoking about 18 years ago. Her smoking use included cigarettes. She has a 15.00 pack-year smoking history. She has never used smokeless tobacco. She reports that she does not drink alcohol and does not use drugs.  Allergies  Allergen Reactions   Beta Adrenergic Blockers     Chest pressure/difficulty breathing   Jardiance [Empagliflozin]     Muscle cramps   Levaquin [Levofloxacin In D5w]     Low blood sugar; does take this when she needs it. She just makes sure to control her BS to avoid hypoglycemia.   Lisinopril Cough   Zetia [Ezetimibe]     Lethargic    Prevnar [Pneumococcal 13-Val Conj Vacc] Rash    Joint Pain   Sulfa Antibiotics Rash    "makes my skin raw"    Family History  Problem Relation Age of Onset   Diabetes Mother    Hypertension Mother    Anxiety disorder Mother    Heart disease Father        rheumatic heart disease   Heart attack Father    Rheum arthritis Father    Colon cancer Brother    Hypertension Maternal Grandmother    Hypertension Maternal Grandfather    Breast cancer Maternal Aunt 60   Breast cancer Cousin 50    Prior to Admission medications   Medication Sig Start Date End Date Taking? Authorizing Provider  aspirin 81 MG EC tablet Take 81 mg by mouth daily.   Yes [provider]  atorvastatin (LIPITOR) 80 MG tablet Take 1 tablet (80 mg total) by mouth at bedtime. 07/10/22  Yes Sharen Hones, MD  clopidogrel (PLAVIX) 75 MG tablet Take 300mg  loading dose 12 hours after the last Brilinta and then 1 tablet daily 07/19/22  Yes Ostwalt, Janna, PA-C  cyclobenzaprine (FLEXERIL) 5 MG tablet Take ONE tab PO BID/TID PRN   Yes [provider]   latanoprost (XALATAN) 0.005 % ophthalmic solution Place 1 drop into both eyes at bedtime.  04/09/15  Yes [provider]  losartan (COZAAR) 25 MG tablet Take 1 tablet (25 mg total) by mouth daily. 07/10/22 07/10/23 Yes Sharen Hones, MD  Magnesium 250 MG TABS Take 250 mg by mouth daily.   Yes [provider]  metFORMIN (GLUCOPHAGE) 500 MG tablet TAKE 2 TABLETS TWICE DAILY WITH MEALS 05/06/22  Yes Bacigalupo, [name], MD  ticagrelor (BRILINTA) 90 MG TABS tablet Take 90 mg by mouth 2 (two) times daily.   Yes [provider]  vitamin B-12 (CYANOCOBALAMIN) 1000 MCG tablet Take 1,000 mcg by mouth daily.   Yes [provider]  Accu-Chek FastClix Lancets MISC TEST FASTING BLOOD SUGAR EVERY DAY 05/17/22   Bacigalupo, [name], MD  Blood Glucose Monitoring Suppl (ACCU-CHEK  GUIDE) w/Device KIT USE AS DIRECTED TO CHECK FASTING BLOOD SUGAR DAILY 12/11/20   Bacigalupo, [name], MD  glucose blood (ACCU-CHEK GUIDE) test strip TEST FASTING BLOOD SUGAR EVERY DAY AS DIRECTED 07/20/21   Bacigalupo, [name], MD  Lancets Misc. (ACCU-CHEK FASTCLIX LANCET) KIT 1 each by Does not apply route as directed. 06/04/20   Virginia Crews, MD  [facility] injection  05/17/22   [provider]    Physical Exam: Vitals:   07/19/22 1018 07/19/22 1019 07/19/22 1330 07/19/22 1530  BP: (!) 151/62  (!) 152/61 (!) 162/71  Pulse: (!) 103  91 87  Resp: 16  18 (!) 23  Temp: 98 F (36.7 C)  98.3 F (36.8 C) 98.2 F (36.8 C)  TempSrc: Oral     SpO2: 100%  99% 96%  Weight:  61.7 kg    Height:  5\' 3"  (1.6 m)     Physical Exam Constitutional:      General: She is not in acute distress.    Appearance: She is normal weight.  HENT:     Head: Normocephalic.     Nose: Nose normal.     Mouth/Throat:     Mouth: Mucous membranes are moist.  Eyes:     Pupils: Pupils are equal, round, and reactive to light.  Cardiovascular:     Rate and Rhythm: Normal rate and regular rhythm.  Pulmonary:      Effort: Pulmonary effort is normal.  Abdominal:     General: Bowel sounds are normal.  Musculoskeletal:        General: Normal range of motion.  Skin:    General: Skin is warm.  Neurological:     General: No focal deficit present.  Psychiatric:        Mood and Affect: Mood normal.     Data Reviewed:  There are no new results to review at this time. CT Angio Chest PE W/Cm &/Or Wo Cm CLINICAL DATA:  High clinical suspicion for PE  EXAM: CT ANGIOGRAPHY CHEST WITH CONTRAST  TECHNIQUE: Multidetector CT imaging of the chest was performed using the standard protocol during bolus administration of intravenous contrast. Multiplanar CT image reconstructions and MIPs were obtained to evaluate the vascular anatomy.  RADIATION DOSE REDUCTION: This exam was performed according to the departmental dose-optimization program which includes automated exposure control, adjustment of the mA and/or kV according to patient size and/or use of iterative reconstruction technique.  CONTRAST:  59mL OMNIPAQUE IOHEXOL 350 MG/ML SOLN  COMPARISON:  Previous studies including the chest radiograph done earlier today  FINDINGS: Cardiovascular: There are no intraluminal filling defects in pulmonary artery branches. There is homogeneous enhancement in thoracic aorta. There are scattered atherosclerotic plaques and calcifications in thoracic aorta and its major branches. Coronary artery calcifications are seen.  Mediastinum/Nodes: No significant lymphadenopathy is seen.  Lungs/Pleura: There is no focal pulmonary consolidation. There is slight increase in interstitial markings in the periphery of the lower lung fields, more so on the left side. Left hemidiaphragm is elevated. There is no pleural effusion or pneumothorax.  Upper Abdomen: No acute findings are seen.  Musculoskeletal: Unremarkable.  Review of the MIP images confirms the above findings.  IMPRESSION: There is no evidence of  pulmonary artery embolism. There is no evidence of thoracic aortic dissection. Atherosclerosis. Coronary artery disease.  There is no focal pulmonary consolidation. There is no pleural effusion or pneumothorax.  Electronically Signed   By: Elmer Picker M.D.   On: 07/19/2022 14:35 DG Chest  2 View CLINICAL DATA:  Shortness of breath.  Anticoagulated.  EXAM: CHEST - 2 VIEW  COMPARISON:  07/07/2022  FINDINGS: Cardiomegaly. Aortic atherosclerosis. Chronic pulmonary scarring. No evidence of active infiltrate, mass, effusion or collapse. Ordinary chronic degenerative changes affect the spine.  IMPRESSION: Cardiomegaly. Aortic atherosclerosis. Chronic pulmonary scarring. No acute finding.  Electronically Signed   By: Nelson Chimes M.D.   On: 07/19/2022 11:08  Lab Results  Component Value Date   WBC 8.0 07/19/2022   HGB 10.4 (L) 07/19/2022   HCT 30.8 (L) 07/19/2022   MCV 91.1 07/19/2022   PLT 303 [id number]   Last metabolic panel Lab Results  Component Value Date   GLUCOSE 126 (H) 07/19/2022   NA 132 (L) 07/19/2022   K 4.4 07/19/2022   CL 98 07/19/2022   CO2 22 07/19/2022   BUN 14 07/19/2022   CREATININE 0.71 07/19/2022   GFRNONAA >60 07/19/2022   CALCIUM 9.3 07/19/2022   PHOS 2.8 07/10/2022   PROT 7.1 07/07/2022   ALBUMIN 4.1 07/07/2022   LABGLOB 2.4 01/20/2022   AGRATIO 1.9 01/20/2022   BILITOT 0.8 07/07/2022   ALKPHOS 46 07/07/2022   AST 32 07/07/2022   ALT 25 07/07/2022   ANIONGAP 12 07/19/2022    Assessment and Plan: * GIB (gastrointestinal bleeding) Noted new upper GIB in setting of recent NSTEMI on ASA and brillinta  Hemoccult + per report  S/p IV protonix in the ER  Will place on IV protonix gtt  Holding brillinta  Will continue baby ASA per Dr. Darnelle Bos from cardiology as coronary stent is very new  Dr. Vicente Males w/ gastroenterology made aware of case  Pending formal consultation  NSTEMI (non-ST elevated myocardial infarction) [facility]) Admitted  for NSTEMI 3/7-3/10 w/ Status postcardiac cath and drug-eluting stent on 3/8  Was placed on asa and brillinta at discharge  + black stools x 3-4 days  Holding brillinta Cont ASA per Dr. Saunders Revel given newness of coronary stent  Trop in 50s today- suspect minimal- mild demand ischemia in setting of acute GIB and mild blood loss  No active CP  Noted peak trop during recent admission at [facility] Follow up formal cardiology recommendations in am     Acute blood loss anemia Hgb 10.4 today w/ baseline hgb around 12 in setting of upper GIB  Will trend hgb overnight  Transfuse for hgb <8 in setting of recent STEMI s/p coronary stenting  Anemia panel  Monitor closely  Type 2 diabetes mellitus without complications (HCC) SSI    Essential hypertension BP stable  Titrate home regimen    Chronic systolic CHF (congestive heart failure) (HCC) Recent 2D ECHO  ejection fraction 35 to 40%  Appears euvolemic  Monitor volume status w/ GIB if pRBC needed Gentle hydration w/ GI losses Strict Is and Os and daily weights  Follow up cardiology recs        Advance Care Planning:   Code Status: Full Code   Consults: Gastroenterology w/ Dr. Vicente Males and Cardiology w/ Dr. Saunders Revel   Family Communication: No family at the bedside   Severity of Illness: The appropriate patient status for this patient is INPATIENT. Inpatient status is judged to be reasonable and necessary in order to provide the required intensity of service to ensure the patient's safety. The patient's presenting symptoms, physical exam findings, and initial radiographic and laboratory data in the context of their chronic comorbidities is felt to place them at high risk for further clinical deterioration. Furthermore, it is  not anticipated that the patient will be medically stable for discharge from the hospital within 2 midnights of admission.   * I certify that at the point of admission it is my clinical judgment that the patient will  require inpatient hospital care spanning beyond 2 midnights from the point of admission due to high intensity of service, high risk for further deterioration and high frequency of surveillance required.*  Author: Deneise Lever, MD 07/19/2022 6:58 PM  For on call review [email or web address].

## 2022-07-20 DIAGNOSIS — E1169 Type 2 diabetes mellitus with other specified complication: Secondary | ICD-10-CM

## 2022-07-20 DIAGNOSIS — K921 Melena: Secondary | ICD-10-CM

## 2022-07-20 DIAGNOSIS — I10 Essential (primary) hypertension: Secondary | ICD-10-CM | POA: Diagnosis not present

## 2022-07-20 DIAGNOSIS — D62 Acute posthemorrhagic anemia: Secondary | ICD-10-CM | POA: Diagnosis not present

## 2022-07-20 DIAGNOSIS — K922 Gastrointestinal hemorrhage, unspecified: Secondary | ICD-10-CM | POA: Diagnosis not present

## 2022-07-20 LAB — GLUCOSE, CAPILLARY
Glucose-Capillary: 108 mg/dL — ABNORMAL HIGH (ref 70–99)
Glucose-Capillary: 122 mg/dL — ABNORMAL HIGH (ref 70–99)
Glucose-Capillary: 111 mg/dL — ABNORMAL HIGH (ref 70–99)
Glucose-Capillary: 95 mg/dL (ref 70–99)
Glucose-Capillary: 188 mg/dL — ABNORMAL HIGH (ref 70–99)
Glucose-Capillary: 150 mg/dL — ABNORMAL HIGH (ref 70–99)
Glucose-Capillary: 89 mg/dL (ref 70–99)

## 2022-07-20 LAB — COMPREHENSIVE METABOLIC PANEL
ALT: 19 U/L (ref 0–44)
AST: 24 U/L (ref 15–41)
Albumin: 4.1 g/dL (ref 3.5–5.0)
Alkaline Phosphatase: 40 U/L (ref 38–126)
Anion gap: 9 (ref 5–15)
BUN: 15 mg/dL (ref 8–23)
CO2: 22 mmol/L (ref 22–32)
Calcium: 9 mg/dL (ref 8.9–10.3)
Chloride: 103 mmol/L (ref 98–111)
Creatinine, Ser: 0.78 mg/dL (ref 0.44–1.00)
GFR, Estimated: >60 mL/min (ref >60)
Glucose, Bld: 126 mg/dL — ABNORMAL HIGH (ref 70–99)
Potassium: 3.5 mmol/L (ref 3.5–5.1)
Sodium: 134 mmol/L — ABNORMAL LOW (ref 135–145)
Total Bilirubin: 1 mg/dL (ref 0.3–1.2)
Total Protein: 7.3 g/dL (ref 6.5–8.1)

## 2022-07-20 LAB — HEMOGLOBIN AND HEMATOCRIT, BLOOD
HCT: 30.2 % — ABNORMAL LOW (ref 36.0–46.0)
HCT: 31.8 % — ABNORMAL LOW (ref 36.0–46.0)
Hemoglobin: 10.6 g/dL — ABNORMAL LOW (ref 12.0–15.0)
Hemoglobin: 10.8 g/dL — ABNORMAL LOW (ref 12.0–15.0)

## 2022-07-20 MED ORDER — LACTATED RINGERS IV BOLUS
250.0000 mL | Freq: Once | INTRAVENOUS | Status: AC
  Administered 2022-07-20: 250 mL via INTRAVENOUS

## 2022-07-20 NOTE — Progress Notes (Signed)
Progress Note  Patient Name: Jessica Wheeler Date of Encounter: 07/20/2022  Primary Cardiologist: Rockey Situ  Subjective   No chest pain or evidence of cardiac decompensation.  Hemoglobin stable at 10.6 this morning.  She has been transition from ticagrelor to clopidogrel with loading dose of yesterday and maintained on aspirin 81 mg given recent PCI to the LAD.  Overnight, the patient ambulated to the bathroom with associated lightheadedness.  She had a BM that was unable to be assessed by clinical staff.  Hemodynamically stable.  Inpatient Medications    Scheduled Meds:  aspirin EC  81 mg Oral Daily   atorvastatin  80 mg Oral QHS   clopidogrel  75 mg Oral Daily   insulin aspart  0-9 Units Subcutaneous Q4H   losartan  25 mg Oral Daily   Continuous Infusions:  pantoprazole 8 mg/hr (07/20/22 0225)   PRN Meds: cyclobenzaprine, ondansetron **OR** ondansetron (ZOFRAN) IV   Vital Signs    Vitals:   07/20/22 0132 07/20/22 0401 07/20/22 0410 07/20/22 0742  BP: (!) 76/34 (!) 112/53 120/68 (!) 124/52  Pulse: 89 84 67 91  Resp:  16 18 18   Temp:  (!) 97.5 F (36.4 C) 97.6 F (36.4 C) 97.8 F (36.6 C)  TempSrc:      SpO2: 97% 99% 99% 99%  Weight:      Height:        Intake/Output Summary (Last 24 hours) at 07/20/2022 1101 Last data filed at 07/20/2022 0145 Gross per 24 hour  Intake 0 ml  Output 800 ml  Net -800 ml   Filed Weights   07/19/22 1019 07/19/22 1951  Weight: 61.7 kg 61.5 kg    Telemetry    Sinus rhythm with artifact - Personally Reviewed  ECG    No new tracings - Personally Reviewed  Physical Exam   GEN: No acute distress.   Neck: No JVD. Cardiac: RRR, no murmurs, rubs, or gallops.  Respiratory: Clear to auscultation bilaterally.  GI: Soft, nontender, non-distended.   MS: No edema; No deformity. Neuro:  Alert and oriented x 3; Nonfocal.  Psych: Normal affect.  Labs    Chemistry Recent Labs  Lab 07/18/22 1634 07/19/22 1023 07/20/22 0654  NA  137 132* 134*  K 4.0 4.4 3.5  CL 98 98 103  CO2 19* 22 22  GLUCOSE 124* 126* 126*  BUN 13 14 15   CREATININE 0.82 0.71 0.78  CALCIUM 9.6 9.3 9.0  PROT  --   --  7.3  ALBUMIN  --   --  4.1  AST  --   --  24  ALT  --   --  19  ALKPHOS  --   --  40  BILITOT  --   --  1.0  GFRNONAA  --  >60 >60  ANIONGAP  --  12 9     Hematology Recent Labs  Lab 07/18/22 1634 07/19/22 1143 07/19/22 1143 07/19/22 1717 07/19/22 2307 07/20/22 0654  WBC 8.2 8.0  --   --   --   --   RBC 3.23* 3.27*  --   --   --   --   HGB 10.5* 10.2*   < > 10.4* 10.6* 10.6*  HCT 30.2* 29.8*   < > 30.8* 30.2* 30.2*  MCV 94 91.1  --   --   --   --   MCH 32.5 31.2  --   --   --   --   MCHC 34.8 34.2  --   --   --   --  RDW 12.9 13.7  --   --   --   --   PLT 356 303  --   --   --   --    < > = values in this interval not displayed.    Cardiac EnzymesNo results for input(s): "TROPONINI" in the last 168 hours. No results for input(s): "TROPIPOC" in the last 168 hours.   BNP Recent Labs  Lab 07/19/22 1336  BNP 730.6*     DDimer No results for input(s): "DDIMER" in the last 168 hours.   Radiology    CT Angio Chest PE W/Cm &/Or Wo Cm  Result Date: 07/19/2022 IMPRESSION: There is no evidence of pulmonary artery embolism. There is no evidence of thoracic aortic dissection. Atherosclerosis. Coronary artery disease. There is no focal pulmonary consolidation. There is no pleural effusion or pneumothorax. Electronically Signed   By: Elmer Picker M.D.   On: 07/19/2022 14:35   DG Chest 2 View  Result Date: 07/19/2022 IMPRESSION: Cardiomegaly. Aortic atherosclerosis. Chronic pulmonary scarring. No acute finding. Electronically Signed   By: Nelson Chimes M.D.   On: 07/19/2022 11:08    Cardiac Studies   2D echo 07/09/2022: 1. Left ventricular ejection fraction, by estimation, is 35 to 40%. The  left ventricle has moderately decreased function. The left ventricle  demonstrates global hypokinesis, basal wall  motion best preserved possibly  consistent with stress cardioyopathy.   Left ventricular diastolic parameters are consistent with Grade I  diastolic dysfunction (impaired relaxation).   2. Right ventricular systolic function is normal. The right ventricular  size is normal. Tricuspid regurgitation signal is inadequate for assessing  PA pressure.   3. The mitral valve is normal in structure. Mild mitral valve  regurgitation. No evidence of mitral stenosis.   4. The aortic valve was not well visualized. Aortic valve regurgitation  is not visualized. No aortic stenosis is present.   5. The inferior vena cava is normal in size with greater than 50%  respiratory variability, suggesting right atrial pressure of 3 mmHg.  __________  [facility] 07/08/2022: Conclusions: Significant multivessel coronary artery disease, as detailed below.  Culprit lesion for the patient's NSTEMI is a hazy 99% stenosis of the mid LAD with TIMI-1 flow.  In addition, there is moderate disease involving the distal LMCA, proximal/mid LAD, and RCA, as detailed below. Moderately elevated left heart filling pressures (LVEDP 30 mmHg, PCWP 26 mmHg). Mildly-moderately elevated right heart filling pressures (mean RA 11 mmHg, RVEDP 14 mmHg). Normal Fick cardiac output/index (CO 4.6 L/min, CI 2.8 L/min/m). Successful PCI to 99% mid LAD stenosis using Onyx frontier 2.0 x 12 mm drug-eluting stent with 0% residual stenosis and improvement in flow from TIMI-1 to TIMI-2.   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 Furosemide 40 mg IV given x 1 at the end of the procedure.  Consider redosing as needed based on urine output.  Potassium repletion also ordered. Aggressive secondary prevention of coronary artery disease. Initiate goal-directed medical therapy for acute HFrEF, as tolerated.  In the setting of soft blood pressure, I will decrease losartan to 25 mg daily.   Once the patient appears euvolemic, rechallenging with beta-blocker will need to be considered.  Patient Profile     82 y.o. female with history of CAD with recent admission for NSTEMI earlier this month complicated by acute HFrEF status post PCI, DM2, aortic dissection, renal artery stenosis, pancreatitis, chronic hyponatremia, HTN, and HLD  who is being evaluated for melena and heart failure.  Assessment & Plan    1.  Melena: -Hemoglobin has down trended since recent admission, though has stabilized at 10.6 currently -She did have a BM overnight, though this was unable to be assessed by clinical staff -She remains on pantoprazole infusion -GI consult pending -Transfuse to maintain hemoglobin greater than 8 or for worsening shortness of breath/chest pain -The patient will need to be maintained on DAPT, now with aspirin and clopidogrel given recent PCI/DES to the LAD less than 2 weeks ago -Patient is without symptoms of angina or cardiac decompensation -She would be at least moderate risk for noninvasive cardiac procedure, no further testing or cardiac intervention will further mitigate this risk -She seems to be favoring watchful waiting versus considering tagged red blood cell scan over EGD at this time  2.  CAD with recent NSTEMI: -Chest pain and shortness of breath present on admission are improved -EKG without acute ischemic changes -No current plans for invasive cardiac procedures at this time in the setting of melena -She has been transition from ticagrelor to clopidogrel, with loading dose on 3/19 with continuation of aspirin 81 mg daily -She will need to be maintained on dual antiplatelet therapy even in the face of GI bleeding with supportive care as indicated -Continue atorvastatin 80 mg  3.  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    Signed, Christell Faith, PA-C [place] Pager: [phone] 07/20/2022, 11:01 AM

## 2022-07-20 NOTE — Telephone Encounter (Signed)
OPEN IN ERROR 

## 2022-07-20 NOTE — Progress Notes (Signed)
At approximately 0140, pt ambulated to bathroom with NT [facility] and began feeling lightheaded. Orthostatic vitals obtained, see vitals. Pt noted to be chronically hyponatremic. While using the bathroom, pt had bowel movement that was not seen by Probation officer. Pt advised to inform nurse of any bowel movements so that they can be assessed for active bleed. Pt verbalized understanding. Pt notes that no blood was present after cleaning herself. Neomia Glass NP informed. Small bolus ordered and administered, see MAR.

## 2022-07-20 NOTE — Progress Notes (Signed)
       CROSS COVER NOTE  NAME: Jessica Wheeler MRN: [id number] DOB : March 04, 1941 ATTENDING PHYSICIAN: Deneise Lever, MD    Date of Service   07/20/2022   HPI/Events of Note   Message received from RN reporting M(r)s Faye is orthostatic this AM, experiencing lightheadedness when ambulating to restroom. Lying 105/46 HR 97; sitting 101/40 HR 81; standing 76/34 HR 89. On Review of chart continuous fluids discontinued yesterday by Cardiology and lasix ordered for diuresis.   Interventions   Assessment/Plan: 250 mL LR      To reach the provider On-Call:   7AM- 7PM see care teams to locate the attending and reach out to them via [email or web address] Password: [email or web address] 7PM-7AM contact night-coverage If you still have difficulty reaching the appropriate provider, please page the [facility] (Director on Call) for [facility] on amion for assistance  This document was prepared using Systems analyst and may include unintentional dictation errors.  Neomia Glass DNP, MBA, FNP-BC, PMHNP-BC Nurse Practitioner [facility] Pager [phone]

## 2022-07-20 NOTE — Consult Note (Addendum)
   Heart Failure Nurse Navigator Note  HFrEF 35 to 40%.  Grade 1 diastolic dysfunction.  Mild mitral regurgitation.  Presented to the emergency room with complaints of shortness of breath, chest pain and black stools.  BNP was 730.  Chest x-ray revealed cardiomegaly.  Comorbidities:  Coronary artery disease with recent stenting Right bundle branch block Diabetes Hyperlipidemia Hypertension   Medications:  Aspirin 81 mg daily Atorvastatin 80 mg at bedtime Losartan 25 mg daily Plavix 75 mg daily She is intolerant of beta-blocker.  And not on SGLT2 inhibitor secondary to muscle cramps.  Labs:  Sodium 134, potassium 3.5, chloride 103, CO2 22, BUN 15, creatinine 0.78, estimated GFR greater than 60, hemoglobin 10.6, hematocrit 30.2 Weight is 61.5 kg Intake not documented Output 800 mL   Meeting with patient who is lying in bed in no acute distress.  She states that she had a pretty rough night with low blood pressures but is feeling better this morning.  Discussed heart failure and what it means.  She voices understanding.  She states that she tries to take care of herself by walking 2 miles a day and weighs herself daily.  Discussed continuing to weigh self daily and report 2 pound weight gain overnight or 5 pounds within the week, or also to report changes in symptoms such as increasing shortness of breath, PND, orthopnea and increased swelling.  Went over sodium restriction of no more than 2000 mg a day and fluid restriction of 64 ounces or less daily.  Also discussed what constitutes a liquid.  States that she very rarely eats at [employer].  Is more interested in fixing healthy foods in their natural state at home.  It aware of follow-up in the outpatient heart failure clinic for which she has an appointment on April 1 at 10 AM.  She has a 1% no-show which is 1 out of 134 appointments missed.  Also went over ventricle health program.  Patient states that she is interested  and would like to enroll.  Application sent to company.  No further questions and will continue to follow along.  He was given living with heart failure teaching booklet, zone magnet, info on low-sodium and heart failure along with weight chart.  Pricilla Riffle RN CHFN  Pricilla Riffle RN CHFN

## 2022-07-20 NOTE — Discharge Instructions (Signed)

## 2022-07-20 NOTE — Consult Note (Signed)
Jonathon Bellows , MD [address], [place], [place], [place], [address] [id number], [place], [place], [place], [address] Phone: [phone]  Fax: [phone]  Consultation  Referring Provider:    Dr Ernestina Patches Primary Care Physician:  Virginia Crews, MD Primary Gastroenterologist:  None         Reason for Consultation:     GI bleed  Date of Admission:  07/19/2022 Date of Consultation:  07/20/2022         HPI:   Jessica Wheeler is a 82 y.o. female was recently admitted between March 7 and 10 for NSTEMI and had a drug-eluting stent placed and developed new onset heart failure with ejection fraction of 35 to 40% patient was discharged on aspirin and Brilinta since discharge been having black stools on admission hemoglobin is 10.2 g at baseline hemoglobin around 12 g.  I have been consulted to evaluate for a GI bleed.  Seen by Dr. Saunders Revel.  10 days back hemoglobin 11.8 g today is 10.6 g.  CMP shows no change in BUN/creatinine ratio over the past 10 days.   Last colonoscopy by Dr. Jamal Collin  in 2018 showed diverticulosis of the colon. She says she has no abdominal pain , nausea or vomiting ,No nsaid use . Denies any hematemesis. Did have a bowel movement earlier today and didn't note the color.   Past Medical History:  Diagnosis Date   Allergy    Arthritis    Bundle branch block, right    C7 cervical fracture (HCC)    Diabetes mellitus    Diffuse cystic mastopathy    Dissection, aorta (HCC)    Dr.Dew follows   Diverticulosis    Family history of malignant neoplasm of gastrointestinal tract    Glaucoma 2005   Heart murmur    History of bronchitis    History of pancreatitis 2005   Hyperlipidemia    Hypertension 1982   Personal history of tobacco use, presenting hazards to health    RBBB    Ulcer     Past Surgical History:  Procedure Laterality Date   ABDOMINAL HYSTERECTOMY  1991   ANTERIOR CERVICAL CORPECTOMY N/A 03/30/2020   Procedure: CORPECTOMY CERVICAL FOUR - CERVICAL  FIVE;  Surgeon: Newman Pies, MD;  Location: [facility];  Service: Neurosurgery;  Laterality: N/A;  anterior   APPENDECTOMY     BACK SURGERY  1982   for lumbar ruptured disc. No hardware, no discectomy, no fusion   BLADDER SURGERY     BREAST BIOPSY Right 1980's   benign   BREAST EXCISIONAL BIOPSY Right 1980s   benign   BREAST SURGERY     biopsy   CARDIAC CATHETERIZATION     Dr. Ubaldo Glassing, few years ago   CATARACT EXTRACTION W/PHACO Left 08/31/2015   Procedure: CATARACT EXTRACTION PHACO AND INTRAOCULAR LENS PLACEMENT ([place]);  Surgeon: Ronnell Freshwater, MD;  Location: [place];  Service: Ophthalmology;  Laterality: Left;  DIABETIC - oral meds   CATARACT EXTRACTION W/PHACO Right 10/12/2015   Procedure: CATARACT EXTRACTION PHACO AND INTRAOCULAR LENS PLACEMENT ([place]) right eye;  Surgeon: Ronnell Freshwater, MD;  Location: [facility];  Service: Ophthalmology;  Laterality: Right;  DIABETIC - oral meds   CHOLECYSTECTOMY  1994   COLONOSCOPY  2008,02/22/2012   Dr. Sankar-2013   COLONOSCOPY WITH PROPOFOL N/A 04/04/2017   Procedure: COLONOSCOPY WITH PROPOFOL;  Surgeon: Christene Lye, MD;  Location: [facility] ENDOSCOPY;  Service: Endoscopy;  Laterality: N/A;   CORONARY STENT  INTERVENTION N/A 07/08/2022   Procedure: CORONARY STENT INTERVENTION;  Surgeon: Nelva Bush, MD;  Location: [place] CV LAB;  Service: Cardiovascular;  Laterality: N/A;   POSTERIOR CERVICAL FUSION/FORAMINOTOMY N/A 04/01/2020   Procedure: POSTERIOR CERVICAL FUSION/FORAMINOTOMY CERVICAL FOUR- CERVICAL SIX;  Surgeon: Newman Pies, MD;  Location: [place];  Service: Neurosurgery;  Laterality: N/A;   REFRACTIVE SURGERY     RIGHT/LEFT HEART CATH AND CORONARY ANGIOGRAPHY N/A 07/08/2022   Procedure: RIGHT/LEFT HEART CATH AND CORONARY ANGIOGRAPHY;  Surgeon: Nelva Bush, MD;  Location: [place] CV LAB;  Service: Cardiovascular;  Laterality: N/A;    Prior to Admission medications   Medication Sig  Start Date End Date Taking? Authorizing Provider  aspirin 81 MG EC tablet Take 81 mg by mouth daily.   Yes [provider]  atorvastatin (LIPITOR) 80 MG tablet Take 1 tablet (80 mg total) by mouth at bedtime. 07/10/22  Yes Sharen Hones, MD  clopidogrel (PLAVIX) 75 MG tablet Take 300mg  loading dose 12 hours after the last Brilinta and then 1 tablet daily 07/19/22  Yes Ostwalt, Janna, PA-C  cyclobenzaprine (FLEXERIL) 5 MG tablet Take ONE tab PO BID/TID PRN   Yes [provider]  latanoprost (XALATAN) 0.005 % ophthalmic solution Place 1 drop into both eyes at bedtime.  04/09/15  Yes [provider]  losartan (COZAAR) 25 MG tablet Take 1 tablet (25 mg total) by mouth daily. 07/10/22 07/10/23 Yes Sharen Hones, MD  Magnesium 250 MG TABS Take 250 mg by mouth daily.   Yes [provider]  metFORMIN (GLUCOPHAGE) 500 MG tablet TAKE 2 TABLETS TWICE DAILY WITH MEALS 05/06/22  Yes Bacigalupo, [name], MD  ticagrelor (BRILINTA) 90 MG TABS tablet Take 90 mg by mouth 2 (two) times daily.   Yes [provider]  vitamin B-12 (CYANOCOBALAMIN) 1000 MCG tablet Take 1,000 mcg by mouth daily.   Yes [provider]  Accu-Chek FastClix Lancets MISC TEST FASTING BLOOD SUGAR EVERY DAY 05/17/22   Bacigalupo, [name], MD  Blood Glucose Monitoring Suppl (ACCU-CHEK GUIDE) w/Device KIT USE AS DIRECTED TO CHECK FASTING BLOOD SUGAR DAILY 12/11/20   Bacigalupo, [name], MD  glucose blood (ACCU-CHEK GUIDE) test strip TEST FASTING BLOOD SUGAR EVERY DAY AS DIRECTED 07/20/21   Bacigalupo, [name], MD  Lancets Misc. (ACCU-CHEK FASTCLIX LANCET) KIT 1 each by Does not apply route as directed. 06/04/20   Virginia Crews, MD  [facility] injection  05/17/22   [provider]    Family History  Problem Relation Age of Onset   Diabetes Mother    Hypertension Mother    Anxiety disorder Mother    Heart disease Father        rheumatic heart disease   Heart attack Father    Rheum  arthritis Father    Colon cancer Brother    Hypertension Maternal Grandmother    Hypertension Maternal Grandfather    Breast cancer Maternal Aunt 60   Breast cancer Cousin 50     Social History   Tobacco Use   Smoking status: Former    Packs/day: 1.00    Years: 15.00    Additional pack years: 0.00    Total pack years: 15.00    Types: Cigarettes    Quit date: 08/03/2003    Years since quitting: 18.9   Smokeless tobacco: Never  Vaping Use   Vaping Use: Never used  Substance Use Topics   Alcohol use: No   Drug use: No    Allergies as of  07/19/2022 - Review Complete 07/19/2022  Allergen Reaction Noted   Beta adrenergic blockers  08/03/2010   Jardiance [empagliflozin]  04/17/2019   Levaquin [levofloxacin in d5w]  10/26/2012   Lisinopril Cough 11/04/2014   Zetia [ezetimibe]  03/18/2020   Prevnar [pneumococcal 13-val conj vacc] Rash 11/04/2014   Sulfa antibiotics Rash 05/11/2017    Review of Systems:    All systems reviewed and negative except where noted in HPI.   Physical Exam:  Vital signs in last 24 hours: Temp:  [97.5 F (36.4 C)-98.3 F (36.8 C)] 97.8 F (36.6 C) (03/20 0742) Pulse Rate:  [67-103] 91 (03/20 0742) Resp:  [16-23] 18 (03/20 0742) BP: (76-166)/(34-71) 124/52 (03/20 0742) SpO2:  [92 %-100 %] 99 % (03/20 0742) Weight:  [61.5 kg-61.7 kg] 61.5 kg (03/19 1951)   General:   Pleasant, cooperative in NAD Head:  Normocephalic and atraumatic. Eyes:   No icterus.   Conjunctiva pink. PERRLA. Ears:  Normal auditory acuity. Neck:  Supple; no masses or thyroidomegaly Lungs: Respirations even and unlabored. Lungs clear to auscultation bilaterally.   No wheezes, crackles, or rhonchi.  Heart:  Regular rate and rhythm;  Without murmur, clicks, rubs or gallops Abdomen:  Soft, nondistended, nontender. Normal bowel sounds. No appreciable masses or hepatomegaly.  No rebound or guarding.  Neurologic:  Alert and oriented x3;  grossly normal neurologically. Skin:  Intact  without significant lesions or rashes. Cervical Nodes:  No significant cervical adenopathy. Psych:  Alert and cooperative. Normal affect.  LAB RESULTS: Recent Labs    07/18/22 1634 07/19/22 1143 07/19/22 1143 07/19/22 1717 07/19/22 2307 07/20/22 0654  WBC 8.2 8.0  --   --   --   --   HGB 10.5* 10.2*   < > 10.4* 10.6* 10.6*  HCT 30.2* 29.8*   < > 30.8* 30.2* 30.2*  PLT 356 303  --   --   --   --    < > = values in this interval not displayed.   BMET Recent Labs    07/18/22 1634 07/19/22 1023 07/20/22 0654  NA 137 132* 134*  K 4.0 4.4 3.5  CL 98 98 103  CO2 19* 22 22  GLUCOSE 124* 126* 126*  BUN 13 14 15   CREATININE 0.82 0.71 0.78  CALCIUM 9.6 9.3 9.0   LFT Recent Labs    07/20/22 0654  PROT 7.3  ALBUMIN 4.1  AST 24  ALT 19  ALKPHOS 40  BILITOT 1.0   PT/INR No results for input(s): "LABPROT", "INR" in the last 72 hours.  STUDIES: CT Angio Chest PE W/Cm &/Or Wo Cm  Result Date: 07/19/2022 CLINICAL DATA:  High clinical suspicion for PE EXAM: CT ANGIOGRAPHY CHEST WITH CONTRAST TECHNIQUE: Multidetector CT imaging of the chest was performed using the standard protocol during bolus administration of intravenous contrast. Multiplanar CT image reconstructions and MIPs were obtained to evaluate the vascular anatomy. RADIATION DOSE REDUCTION: This exam was performed according to the departmental dose-optimization program which includes automated exposure control, adjustment of the mA and/or kV according to patient size and/or use of iterative reconstruction technique. CONTRAST:  45mL OMNIPAQUE IOHEXOL 350 MG/ML SOLN COMPARISON:  Previous studies including the chest radiograph done earlier today FINDINGS: Cardiovascular: There are no intraluminal filling defects in pulmonary artery branches. There is homogeneous enhancement in thoracic aorta. There are scattered atherosclerotic plaques and calcifications in thoracic aorta and its major branches. Coronary artery calcifications  are seen. Mediastinum/Nodes: No significant lymphadenopathy is seen. Lungs/Pleura: There is no focal  pulmonary consolidation. There is slight increase in interstitial markings in the periphery of the lower lung fields, more so on the left side. Left hemidiaphragm is elevated. There is no pleural effusion or pneumothorax. Upper Abdomen: No acute findings are seen. Musculoskeletal: Unremarkable. Review of the MIP images confirms the above findings. IMPRESSION: There is no evidence of pulmonary artery embolism. There is no evidence of thoracic aortic dissection. Atherosclerosis. Coronary artery disease. There is no focal pulmonary consolidation. There is no pleural effusion or pneumothorax. Electronically Signed   By: Elmer Picker M.D.   On: 07/19/2022 14:35   DG Chest 2 View  Result Date: 07/19/2022 CLINICAL DATA:  Shortness of breath.  Anticoagulated. EXAM: CHEST - 2 VIEW COMPARISON:  07/07/2022 FINDINGS: Cardiomegaly. Aortic atherosclerosis. Chronic pulmonary scarring. No evidence of active infiltrate, mass, effusion or collapse. Ordinary chronic degenerative changes affect the spine. IMPRESSION: Cardiomegaly. Aortic atherosclerosis. Chronic pulmonary scarring. No acute finding. Electronically Signed   By: Nelson Chimes M.D.   On: 07/19/2022 11:08      Impression / Plan:   AALEAHYA KOLER is a 82 y.o. y/o female with recent NSTEMI started on aspirin and Brilinta presents to the hospital with melena been consulted for GI bleed drop in hemoglobin of over a gram from baseline no significant elevation in BUN/creatinine ratio.  This could be a lower GI bleed particularly right colon but with melena could also be an upper GI bleed although the evidence does not support the same.  Brilinta has been held , still on plavix patient has been evaluated by cardiology  Plan 1.  Monitor CBC transfuse as needed 2.  IV PPI 3.  If has brisk bleeding consider CT angiogram otherwise , EGD at this time risk > benefit  with recent NSTEMI. Discussed with Dr Marisue Humble team and plan is to hold brillanta and watch . They are considering tagged RBC scan , if hb drops significantly we can reassess strategy.   Thank you for involving me in the care of this patient.      LOS: 1 day   Jonathon Bellows, MD  07/20/2022, 8:26 AM

## 2022-07-20 NOTE — Progress Notes (Addendum)
PROGRESS NOTE    Jessica Wheeler  [id number] DOB: [birth year] DOA: 07/19/2022 PCP: Virginia Crews, MD   Assessment & Plan:   Principal Problem:   GIB (gastrointestinal bleeding) Active Problems:   NSTEMI (non-ST elevated myocardial infarction) [facility])   Essential hypertension   Type 2 diabetes mellitus without complications ([place])   Acute blood loss anemia   Chronic systolic CHF (congestive heart failure) (HCC)  Assessment and Plan: GI bleeding: had black stools x 3-4 days. Continue on PPI. Holding brillinta and continue on aspirin, plavix as pt has a new stent. No EGD at this time as risk > benefit in setting of recent NSTEMI as per GI. Clear liquid diet as per GI.    NSTEMI: admitted for NSTEMI 3/7-3/10 & s/p cath, drug-eluting stent on 3/8 & placed on brillinta, aspirin. Continue on aspirin, plavix but holding brillinta. No chest pain. Minimally elevated troponins. Cardio following and recs apprec    Acute blood loss anemia: no need for a transfusion currently but will transfuse if Hb < 8.0 in setting of recent NSTEMI    DM2: pretty well controlled, last HbA1c 7.1. Continue on SSI w/ accuchecks    HTN: continue on losartan   Chronic systolic CHF: recent echo showed EF 35-40%. Monitor I/Os, daily weights. Appears euvolemic. Continue on losartan. Cardio following and recs apprec      DVT prophylaxis: SCDs Code Status: full  Family Communication:  Disposition Plan: likely will d/c back home    Status is: Inpatient Remains inpatient appropriate because: monitor H&H in setting of likely GI bleed & recent hx of NSTEMI    Level of care: Med-Surg Consultants:  GI Cardio   Procedures:   Antimicrobials:   Subjective: Pt c/o being hungry. Pt denies any black stools so far today  Objective: Vitals:   07/20/22 0132 07/20/22 0401 07/20/22 0410 07/20/22 0742  BP: (!) 76/34 (!) 112/53 120/68 (!) 124/52  Pulse: 89 84 67 91  Resp:  16 18 18   Temp:  (!) 97.5 F  (36.4 C) 97.6 F (36.4 C) 97.8 F (36.6 C)  TempSrc:      SpO2: 97% 99% 99% 99%  Weight:      Height:        Intake/Output Summary (Last 24 hours) at 07/20/2022 0747 Last data filed at 07/20/2022 0145 Gross per 24 hour  Intake 0 ml  Output 800 ml  Net -800 ml   Filed Weights   07/19/22 1019 07/19/22 1951  Weight: 61.7 kg 61.5 kg    Examination:  General exam: Appears calm and comfortable  Respiratory system: Clear to auscultation. Respiratory effort normal. Cardiovascular system: S1 & S2 +. No rubs, gallops or clicks. Gastrointestinal system: Abdomen is nondistended, soft and nontender. Normal bowel sounds heard. Central nervous system: Alert and oriented. Moves all extremities  Psychiatry: Judgement and insight appear normal. Mood & affect appropriate.     Data Reviewed: I have personally reviewed following labs and imaging studies  CBC: Recent Labs  Lab 07/18/22 1634 07/19/22 1143 07/19/22 1717 07/19/22 2307 07/20/22 0654  WBC 8.2 8.0  --   --   --   NEUTROABS 4.8 5.3  --   --   --   HGB 10.5* 10.2* 10.4* 10.6* 10.6*  HCT 30.2* 29.8* 30.8* 30.2* 30.2*  MCV 94 91.1  --   --   --   PLT 356 303  --   --   --    Basic Metabolic Panel: Recent Labs  Lab 07/18/22 1634 07/19/22 1023 07/20/22 0654  NA 137 132* 134*  K 4.0 4.4 3.5  CL 98 98 103  CO2 19* 22 22  GLUCOSE 124* 126* 126*  BUN 13 14 15   CREATININE 0.82 0.71 0.78  CALCIUM 9.6 9.3 9.0   GFR: Estimated Creatinine Clearance: 44.1 mL/min (by C-G formula based on SCr of 0.78 mg/dL). Liver Function Tests: Recent Labs  Lab 07/20/22 0654  AST 24  ALT 19  ALKPHOS 40  BILITOT 1.0  PROT 7.3  ALBUMIN 4.1   No results for input(s): "LIPASE", "AMYLASE" in the last 168 hours. No results for input(s): "AMMONIA" in the last 168 hours. Coagulation Profile: No results for input(s): "INR", "PROTIME" in the last 168 hours. Cardiac Enzymes: No results for input(s): "CKTOTAL", "CKMB", "CKMBINDEX",  "TROPONINI" in the last 168 hours. BNP (last 3 results) No results for input(s): "PROBNP" in the last 8760 hours. HbA1C: No results for input(s): "HGBA1C" in the last 72 hours. CBG: Recent Labs  Lab 07/19/22 2008 07/19/22 2342 07/20/22 0120 07/20/22 0405 07/20/22 0742  GLUCAP 110* 112* 108* 122* 111*   Lipid Profile: No results for input(s): "CHOL", "HDL", "LDLCALC", "TRIG", "CHOLHDL", "LDLDIRECT" in the last 72 hours. Thyroid Function Tests: No results for input(s): "TSH", "T4TOTAL", "FREET4", "T3FREE", "THYROIDAB" in the last 72 hours. Anemia Panel: No results for input(s): "VITAMINB12", "FOLATE", "FERRITIN", "TIBC", "IRON", "RETICCTPCT" in the last 72 hours. Sepsis Labs: No results for input(s): "PROCALCITON", "LATICACIDVEN" in the last 168 hours.  Recent Results (from the past 240 hour(s))  Resp panel by RT-PCR (RSV, Flu A&B, Covid) Anterior Nasal Swab     Status: None   Collection Time: 07/19/22  1:36 PM   Specimen: Anterior Nasal Swab  Result Value Ref Range Status   SARS Coronavirus 2 by RT PCR NEGATIVE NEGATIVE Final    Comment: (NOTE) SARS-CoV-2 target nucleic acids are NOT DETECTED.  The SARS-CoV-2 RNA is generally detectable in upper respiratory specimens during the acute phase of infection. The lowest concentration of SARS-CoV-2 viral copies this assay can detect is 138 copies/mL. A negative result does not preclude SARS-Cov-2 infection and should not be used as the sole basis for treatment or other patient management decisions. A negative result may occur with  improper specimen collection/handling, submission of specimen other than nasopharyngeal swab, presence of viral mutation(s) within the areas targeted by this assay, and inadequate number of viral copies(<138 copies/mL). A negative result must be combined with clinical observations, patient history, and epidemiological information. The expected result is Negative.  Fact Sheet for Patients:   EntrepreneurPulse.com.au  Fact Sheet for Healthcare Providers:  IncredibleEmployment.be  This test is no t yet approved or cleared by the [place] FDA and  has been authorized for detection and/or diagnosis of SARS-CoV-2 by FDA under an Emergency Use Authorization (EUA). This EUA will remain  in effect (meaning this test can be used) for the duration of the COVID-19 declaration under Section 564(b)(1) of the Act, 21 U.S.C.section 360bbb-3(b)(1), unless the authorization is terminated  or revoked sooner.       Influenza A by PCR NEGATIVE NEGATIVE Final   Influenza B by PCR NEGATIVE NEGATIVE Final    Comment: (NOTE) The Xpert Xpress SARS-CoV-2/FLU/RSV plus assay is intended as an aid in the diagnosis of influenza from Nasopharyngeal swab specimens and should not be used as a sole basis for treatment. Nasal washings and aspirates are unacceptable for Xpert Xpress SARS-CoV-2/FLU/RSV testing.  Fact Sheet for Patients: EntrepreneurPulse.com.au  Fact Sheet  for Healthcare Providers: IncredibleEmployment.be  This test is not yet approved or cleared by the [place] and has been authorized for detection and/or diagnosis of SARS-CoV-2 by FDA under an Emergency Use Authorization (EUA). This EUA will remain in effect (meaning this test can be used) for the duration of the COVID-19 declaration under Section 564(b)(1) of the Act, 21 U.S.C. section 360bbb-3(b)(1), unless the authorization is terminated or revoked.     Resp Syncytial Virus by PCR NEGATIVE NEGATIVE Final    Comment: (NOTE) Fact Sheet for Patients: EntrepreneurPulse.com.au  Fact Sheet for Healthcare Providers: IncredibleEmployment.be  This test is not yet approved or cleared by the [place] FDA and has been authorized for detection and/or diagnosis of SARS-CoV-2 by FDA under an Emergency Use  Authorization (EUA). This EUA will remain in effect (meaning this test can be used) for the duration of the COVID-19 declaration under Section 564(b)(1) of the Act, 21 U.S.C. section 360bbb-3(b)(1), unless the authorization is terminated or revoked.  Performed at [facility], [address] [place], [place] [address]          Radiology Studies: CT Angio Chest PE W/Cm &/Or Wo Cm  Result Date: 07/19/2022 CLINICAL DATA:  High clinical suspicion for PE EXAM: CT ANGIOGRAPHY CHEST WITH CONTRAST TECHNIQUE: Multidetector CT imaging of the chest was performed using the standard protocol during bolus administration of intravenous contrast. Multiplanar CT image reconstructions and MIPs were obtained to evaluate the vascular anatomy. RADIATION DOSE REDUCTION: This exam was performed according to the departmental dose-optimization program which includes automated exposure control, adjustment of the mA and/or kV according to patient size and/or use of iterative reconstruction technique. CONTRAST:  26mL OMNIPAQUE IOHEXOL 350 MG/ML SOLN COMPARISON:  Previous studies including the chest radiograph done earlier today FINDINGS: Cardiovascular: There are no intraluminal filling defects in pulmonary artery branches. There is homogeneous enhancement in thoracic aorta. There are scattered atherosclerotic plaques and calcifications in thoracic aorta and its major branches. Coronary artery calcifications are seen. Mediastinum/Nodes: No significant lymphadenopathy is seen. Lungs/Pleura: There is no focal pulmonary consolidation. There is slight increase in interstitial markings in the periphery of the lower lung fields, more so on the left side. Left hemidiaphragm is elevated. There is no pleural effusion or pneumothorax. Upper Abdomen: No acute findings are seen. Musculoskeletal: Unremarkable. Review of the MIP images confirms the above findings. IMPRESSION: There is no evidence of pulmonary artery embolism.  There is no evidence of thoracic aortic dissection. Atherosclerosis. Coronary artery disease. There is no focal pulmonary consolidation. There is no pleural effusion or pneumothorax. Electronically Signed   By: Elmer Picker M.D.   On: 07/19/2022 14:35   DG Chest 2 View  Result Date: 07/19/2022 CLINICAL DATA:  Shortness of breath.  Anticoagulated. EXAM: CHEST - 2 VIEW COMPARISON:  07/07/2022 FINDINGS: Cardiomegaly. Aortic atherosclerosis. Chronic pulmonary scarring. No evidence of active infiltrate, mass, effusion or collapse. Ordinary chronic degenerative changes affect the spine. IMPRESSION: Cardiomegaly. Aortic atherosclerosis. Chronic pulmonary scarring. No acute finding. Electronically Signed   By: Nelson Chimes M.D.   On: 07/19/2022 11:08        Scheduled Meds:  aspirin EC  81 mg Oral Daily   atorvastatin  80 mg Oral QHS   clopidogrel  75 mg Oral Daily   insulin aspart  0-9 Units Subcutaneous Q4H   losartan  25 mg Oral Daily   Continuous Infusions:  pantoprazole 8 mg/hr (07/20/22 0225)     LOS: 1 day    Time spent:  35 mins    Wyvonnia Dusky, MD [facility] Pager [phone]  If 7PM-7AM, please contact night-coverage [email or web address] 07/20/2022, 7:47 AM

## 2022-07-21 DIAGNOSIS — D649 Anemia, unspecified: Secondary | ICD-10-CM

## 2022-07-21 DIAGNOSIS — I10 Essential (primary) hypertension: Secondary | ICD-10-CM | POA: Diagnosis not present

## 2022-07-21 DIAGNOSIS — E119 Type 2 diabetes mellitus without complications: Secondary | ICD-10-CM | POA: Diagnosis not present

## 2022-07-21 DIAGNOSIS — K921 Melena: Secondary | ICD-10-CM | POA: Diagnosis not present

## 2022-07-21 DIAGNOSIS — R0609 Other forms of dyspnea: Secondary | ICD-10-CM | POA: Diagnosis not present

## 2022-07-21 DIAGNOSIS — I420 Dilated cardiomyopathy: Secondary | ICD-10-CM

## 2022-07-21 DIAGNOSIS — E1169 Type 2 diabetes mellitus with other specified complication: Secondary | ICD-10-CM | POA: Diagnosis not present

## 2022-07-21 DIAGNOSIS — K922 Gastrointestinal hemorrhage, unspecified: Secondary | ICD-10-CM | POA: Diagnosis not present

## 2022-07-21 LAB — CBC
HCT: 30.2 % — ABNORMAL LOW (ref 36.0–46.0)
Hemoglobin: 10.3 g/dL — ABNORMAL LOW (ref 12.0–15.0)
MCH: 31.1 pg (ref 26.0–34.0)
MCHC: 34.1 g/dL (ref 30.0–36.0)
MCV: 91.2 fL (ref 80.0–100.0)
Platelets: 326 10*3/uL (ref 150–400)
RBC: 3.31 MIL/uL — ABNORMAL LOW (ref 3.87–5.11)
RDW: 13.7 % (ref 11.5–15.5)
WBC: 6 10*3/uL (ref 4.0–10.5)
nRBC: 0 % (ref 0.0–0.2)

## 2022-07-21 LAB — GLUCOSE, CAPILLARY
Glucose-Capillary: 127 mg/dL — ABNORMAL HIGH (ref 70–99)
Glucose-Capillary: 127 mg/dL — ABNORMAL HIGH (ref 70–99)
Glucose-Capillary: 139 mg/dL — ABNORMAL HIGH (ref 70–99)

## 2022-07-21 MED ORDER — CLOPIDOGREL BISULFATE 75 MG PO TABS: 75.0000 mg | ORAL_TABLET | Freq: Every day | ORAL | 0 refills | Status: DC

## 2022-07-21 MED ORDER — PANTOPRAZOLE SODIUM 40 MG PO TBEC: 40.0000 mg | DELAYED_RELEASE_TABLET | Freq: Every day | ORAL | 0 refills | Status: DC

## 2022-07-21 NOTE — Progress Notes (Signed)
Discharge instructions reviewed with patient including followup visits and new medications.  Understanding was verbalized and all questions were answered.  IV removed without complication; patient tolerated well.  Patient discharged home via wheelchair in stable condition escorted by volunteer staff.  

## 2022-07-21 NOTE — TOC Initial Note (Signed)
Transition of Care [facility]) - Initial/Assessment Note    Patient Details  Name: CRISTIANA SCHETTLER MRN: [id number] Date of Birth: 1940-06-28  Transition of Care [facility]) CM/SW Contact:    Beverly Sessions, RN Phone Number: 07/21/2022, 1:15 PM  Clinical Narrative:                  Transition of Care [facility]) Screening Note   Patient Details  Name: MELIA LIEBLER Date of Birth: [birth year]   Transition of Care [facility]) CM/SW Contact:    Beverly Sessions, RN Phone Number: 07/21/2022, 1:15 PM    Transition of Care Department [facility]) has reviewed patient and no TOC needs have been identified at this time. We will continue to monitor patient advancement through interdisciplinary progression rounds. If new patient transition needs arise, please place a TOC consult.  Per MD no toc needs for discharge         Patient Goals and CMS Choice            Expected Discharge Plan and Services         Expected Discharge Date: 07/21/22                                    Prior Living Arrangements/Services                       Activities of Daily Living Home Assistive Devices/Equipment: Eyeglasses ADL Screening (condition at time of admission) Patient's cognitive ability adequate to safely complete daily activities?: Yes Is the patient deaf or have difficulty hearing?: Yes Does the patient have difficulty seeing, even when wearing glasses/contacts?: No Does the patient have difficulty concentrating, remembering, or making decisions?: No Patient able to express need for assistance with ADLs?: Yes Does the patient have difficulty dressing or bathing?: No Independently performs ADLs?: Yes (appropriate for developmental age) Does the patient have difficulty walking or climbing stairs?: No Weakness of Legs: None Weakness of Arms/Hands: None  Permission Sought/Granted                  Emotional Assessment              Admission diagnosis:  GIB (gastrointestinal  bleeding) [K92.2] Exertional dyspnea [R06.09] Gastrointestinal hemorrhage, unspecified gastrointestinal hemorrhage type [K92.2] Anemia, unspecified type [D64.9] Patient Active Problem List   Diagnosis Date Noted   GIB (gastrointestinal bleeding) 07/19/2022   Acute blood loss anemia [id number]   Chronic systolic CHF (congestive heart failure) ([place]) [id number]   Acute systolic (congestive) heart failure ([place]) 07/10/2022   Hypokalemia 07/08/2022   NSTEMI (non-ST elevated myocardial infarction) ([place]) 07/07/2022   Type 2 diabetes mellitus without complications ([place]) [id number]   Diarrhea of presumed infectious origin 03/08/2022   Hyponatremia 01/20/2022   Essential hypertension 01/20/2022   Diabetes mellitus ([place]) 10/22/2021   Carpal tunnel syndrome 07/13/2021   Numbness of hand 07/13/2021   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[place]) 07/13/2021   Spinal stenosis of lumbar region 07/13/2021   Cervical spondylosis with myelopathy and radiculopathy 04/01/2020   Lumbar adjacent segment disease with spondylolisthesis 03/30/2020   Overactive bladder 01/17/2020   Myalgia due to statin 12/13/2019   Spinal stenosis in cervical region 11/19/2019   Cervical spine pain 10/15/2019   Low back pain 03/26/2019   History of lumbar laminectomy 03/26/2019   Scoliosis deformity of spine 03/26/2019   Spondylolisthesis, grade 1 03/26/2019   Lumbar  radiculopathy 03/26/2019   Hyperlipidemia associated with type 2 diabetes mellitus ([place]) 03/15/2019   Fatigue 12/11/2017   Primary osteoarthritis of left knee 03/10/2016   Depression 12/28/2015   Knee pain 11/26/2015   Allergic rhinitis 01/31/2015   Benign paroxysmal positional nystagmus 01/31/2015   Bladder infection, chronic 01/31/2015   Glaucoma 01/31/2015   Chronic hyponatremia 01/31/2015   Malaise and fatigue 01/31/2015   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 [place] [address] Phone: [phone] Fax: [phone]  [facility] [place], [place] [address] Phone: [phone] Fax: [phone]     Social Determinants of Health (SDOH) Social History: SDOH Screenings   Food Insecurity: No Food Insecurity (07/19/2022)  Housing: Low Risk  (07/19/2022)  Transportation Needs: No Transportation Needs (07/19/2022)  Utilities: Not At Risk (07/19/2022)  Alcohol Screen: Low Risk  (07/12/2022)  Depression (PHQ2-9): Low Risk  (07/12/2022)  Financial Resource Strain: Low Risk  (07/14/2021)  Physical Activity: Sufficiently Active (07/14/2021)  Social Connections: Moderately Isolated (07/14/2021)  Stress: No Stress Concern Present (07/14/2021)  Tobacco Use: Medium Risk (07/19/2022)   SDOH Interventions: Housing Interventions: Intervention Not Indicated   Readmission Risk Interventions     No data to display

## 2022-07-21 NOTE — Discharge Summary (Addendum)
Physician Discharge Summary  [name] [id number] DOB: August 22, 1940 DOA: 07/19/2022  PCP: Virginia Crews, MD  Admit date: 07/19/2022 Discharge date: 07/21/2022  Admitted From: home  Disposition:  home   Recommendations for Outpatient Follow-up:  Follow up with PCP in 1-2 weeks F/u w/ cardio in 1-2 weeks  Home Health: Equipment/Devices:  Discharge Condition: stable CODE STATUS: full  Diet recommendation: Heart Healthy / Carb Modified   Brief/Interim Summary: HPI was taken from Dr. Ernestina Patches: Jessica Wheeler is a 82 y.o. female with medical history significant of multiple medical issue including hypertension, coronary artery disease, hyperlipidemia, recent NSTEMI presenting with upper GI bleed, acute blood loss anemia.  Patient noted to have been admitted March 7 through March 10 for acute NSTEMI with a peak troponin of around 6400.  Patient underwent cardiac catheterization with placement of drug-eluting stent.  Patient also found to have new onset systolic heart failure with an EF of 35 to 40%.  Patient was discharged on aspirin and Brilinta.  Patient reports from day of discharge has had recurring episodes of black stools.  No abdominal pain, nausea or vomiting.  No chest pain or shortness of breath.  Minimal to mild chest pain.  No fevers or chills.  Has had worsening weakness over the same timeframe.  Has been compliant with home regimen including aspirin and Brilinta. Presented to the ER afebrile, hemodynamically stable.  White count 8, hemoglobin 10.2 with baseline hemoglobin around 12.  Creatinine 0.7.  Troponin 40s to 50s.  BNP is 730.  Chest x-ray with cardiomegaly.  CTA of the chest stable.  As per Dr. Jimmye Norman 3/20-3/21/24: Pt presented w/ likely GI bleed as pt had black stools x 3-4 days. Pt's home dose of brilinita was held, pt was continue on aspirin and pt was started on plavix. Pt did not have anymore black stools while inpatient and pt's H&H remained stable. GI  evaluated the pt and no acute procedure was indicated as per GI. Pt was d/c home on po aspirin, plavix and pantoprazole. Brilinita was d/c as per cardio recs.   Discharge Diagnoses:  Principal Problem:   GIB (gastrointestinal bleeding) Active Problems:   NSTEMI (non-ST elevated myocardial infarction) ([place])   Essential hypertension   Type 2 diabetes mellitus without complications ([place])   Acute blood loss anemia   Chronic systolic CHF (congestive heart failure) ([facility])  GI bleeding: had black stools x 3-4 days. Continue on PPI. Continue on aspirin, plavix as pt has a new stent. Brilinta was d/c as per cardio. No EGD at this time as risk > benefit in setting of recent NSTEMI as per GI.    NSTEMI: admitted for NSTEMI 3/7-3/10 & s/p cath, drug-eluting stent on 3/8 & placed on brillinta, aspirin. Continue on aspirin, plavix. D/c brillinta. No chest pain. Minimally elevated troponins. Cardio following and recs apprec    Acute blood loss anemia: no need for a transfusion currently but will transfuse if Hb < 8.0 in setting of recent NSTEMI    DM2: pretty well controlled, last HbA1c 7.1. Continue on SSI w/ accuchecks    HTN: continue on losartan    Acute on chronic combined CHF: recent echo showed EF 35-40%. Monitor I/Os, daily weights. Appears euvolemic. Continue on losartan. Cardio following and recs apprec  Discharge Instructions  Discharge Instructions     Diet - low sodium heart healthy   Complete by: As directed    Diet Carb Modified   Complete by: As directed  Discharge instructions   Complete by: As directed    F/u w/ PCP in 1-2 weeks. F/u w/ cardio w/ previously scheduled appointment   Increase activity slowly   Complete by: As directed       Allergies as of 07/21/2022       Reactions   Beta Adrenergic Blockers    Chest pressure/difficulty breathing   Jardiance [empagliflozin]    Muscle cramps   Levaquin [levofloxacin In D5w]    Low blood sugar; does take this when she  needs it. She just makes sure to control her BS to avoid hypoglycemia.   Lisinopril Cough   Zetia [ezetimibe]    Lethargic    Prevnar [pneumococcal 13-val Conj Vacc] Rash   Joint Pain   Sulfa Antibiotics Rash   "makes my skin raw"        Medication List     STOP taking these medications    ticagrelor 90 MG Tabs tablet Commonly known as: BRILINTA       TAKE these medications    Accu-Chek FastClix Lancet Kit 1 each by Does not apply route as directed.   Accu-Chek FastClix Lancets Misc TEST FASTING BLOOD SUGAR EVERY DAY   Accu-Chek Guide test strip Generic drug: glucose blood TEST FASTING BLOOD SUGAR EVERY DAY AS DIRECTED   Accu-Chek Guide w/Device Kit USE AS DIRECTED TO CHECK FASTING BLOOD SUGAR DAILY   aspirin EC 81 MG tablet Take 81 mg by mouth daily.   atorvastatin 80 MG tablet Commonly known as: LIPITOR Take 1 tablet (80 mg total) by mouth at bedtime.   clopidogrel 75 MG tablet Commonly known as: PLAVIX Take 1 tablet (75 mg total) by mouth daily. Start taking on: July 22, 2022 What changed:  how much to take how to take this when to take this additional instructions   cyanocobalamin 1000 MCG tablet Commonly known as: VITAMIN B12 Take 1,000 mcg by mouth daily.   cyclobenzaprine 5 MG tablet Commonly known as: FLEXERIL Take ONE tab PO BID/TID PRN   latanoprost 0.005 % ophthalmic solution Commonly known as: XALATAN Place 1 drop into both eyes at bedtime.   losartan 25 MG tablet Commonly known as: Cozaar Take 1 tablet (25 mg total) by mouth daily.   Magnesium 250 MG Tabs Take 250 mg by mouth daily.   metFORMIN 500 MG tablet Commonly known as: GLUCOPHAGE TAKE 2 TABLETS TWICE DAILY WITH MEALS   pantoprazole 40 MG tablet Commonly known as: Protonix Take 1 tablet (40 mg total) by mouth daily.   Shingrix injection Generic drug: Zoster Vaccine Adjuvanted        Allergies  Allergen Reactions   Beta Adrenergic Blockers     Chest  pressure/difficulty breathing   Jardiance [Empagliflozin]     Muscle cramps   Levaquin [Levofloxacin In D5w]     Low blood sugar; does take this when she needs it. She just makes sure to control her BS to avoid hypoglycemia.   Lisinopril Cough   Zetia [Ezetimibe]     Lethargic    Prevnar [Pneumococcal 13-Val Conj Vacc] Rash    Joint Pain   Sulfa Antibiotics Rash    "makes my skin raw"    Consultations: Cardio GI- Dr. Vicente Males    Procedures/Studies: CT Angio Chest PE W/Cm &/Or Wo Cm  Result Date: 07/19/2022 CLINICAL DATA:  High clinical suspicion for PE EXAM: CT ANGIOGRAPHY CHEST WITH CONTRAST TECHNIQUE: Multidetector CT imaging of the chest was performed using the standard protocol during bolus administration of  intravenous contrast. Multiplanar CT image reconstructions and MIPs were obtained to evaluate the vascular anatomy. RADIATION DOSE REDUCTION: This exam was performed according to the departmental dose-optimization program which includes automated exposure control, adjustment of the mA and/or kV according to patient size and/or use of iterative reconstruction technique. CONTRAST:  52mL OMNIPAQUE IOHEXOL 350 MG/ML SOLN COMPARISON:  Previous studies including the chest radiograph done earlier today FINDINGS: Cardiovascular: There are no intraluminal filling defects in pulmonary artery branches. There is homogeneous enhancement in thoracic aorta. There are scattered atherosclerotic plaques and calcifications in thoracic aorta and its major branches. Coronary artery calcifications are seen. Mediastinum/Nodes: No significant lymphadenopathy is seen. Lungs/Pleura: There is no focal pulmonary consolidation. There is slight increase in interstitial markings in the periphery of the lower lung fields, more so on the left side. Left hemidiaphragm is elevated. There is no pleural effusion or pneumothorax. Upper Abdomen: No acute findings are seen. Musculoskeletal: Unremarkable. Review of the MIP images  confirms the above findings. IMPRESSION: There is no evidence of pulmonary artery embolism. There is no evidence of thoracic aortic dissection. Atherosclerosis. Coronary artery disease. There is no focal pulmonary consolidation. There is no pleural effusion or pneumothorax. Electronically Signed   By: Elmer Picker M.D.   On: 07/19/2022 14:35   DG Chest 2 View  Result Date: 07/19/2022 CLINICAL DATA:  Shortness of breath.  Anticoagulated. EXAM: CHEST - 2 VIEW COMPARISON:  07/07/2022 FINDINGS: Cardiomegaly. Aortic atherosclerosis. Chronic pulmonary scarring. No evidence of active infiltrate, mass, effusion or collapse. Ordinary chronic degenerative changes affect the spine. IMPRESSION: Cardiomegaly. Aortic atherosclerosis. Chronic pulmonary scarring. No acute finding. Electronically Signed   By: Nelson Chimes M.D.   On: 07/19/2022 11:08   ECHOCARDIOGRAM COMPLETE  Result Date: 07/09/2022    ECHOCARDIOGRAM REPORT   Patient Name:   Jessica Wheeler Date of Exam: 07/09/2022 Medical Rec #:  [id number]      Height:       63.0 in Accession #:    [id number]     Weight:       143.3 lb Date of Birth:  May 02, 1941     BSA:          1.678 m Patient Age:    45 years       BP:           110/57 mmHg Patient Gender: F              HR:           89 bpm. Exam Location:  [facility] Procedure: 2D Echo and Intracardiac Opacification Agent Indications:     Acute Myocardial Infarction I21.9  History:         Patient has prior history of Echocardiogram examinations, most                  recent 11/01/2011.  Sonographer:     Kathlen Brunswick RDCS Referring Phys:  [phone] CHRISTOPHER END Diagnosing Phys: Ida Rogue MD  Sonographer Comments: Technically difficult study due to poor echo windows, suboptimal parasternal window, suboptimal apical window and suboptimal subcostal window. Image acquisition challenging due to respiratory motion. IMPRESSIONS  1. Left ventricular ejection fraction, by estimation, is 35 to 40%. The left ventricle has  moderately decreased function. 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  Left ventricular diastolic parameters are consistent with Grade I diastolic dysfunction (impaired relaxation).  2. Right ventricular systolic function is normal. The right ventricular size is  normal. Tricuspid regurgitation signal is inadequate for assessing PA pressure.  3. The mitral valve is normal in structure. Mild mitral valve regurgitation. No evidence of mitral stenosis.  4. The aortic valve was not well visualized. Aortic valve regurgitation is not visualized. No aortic stenosis is present.  5. The inferior vena cava is normal in size with greater than 50% respiratory variability, suggesting right atrial pressure of 3 mmHg. FINDINGS  Left Ventricle: Left ventricular ejection fraction, by estimation, is 35 to 40%. The left ventricle has moderately decreased function. The left ventricle demonstrates global hypokinesis. Definity contrast agent was given IV to delineate the left ventricular endocardial borders. The left ventricular internal cavity size was normal in size. There is no left ventricular hypertrophy. Left ventricular diastolic parameters are consistent with Grade I diastolic dysfunction (impaired relaxation). Right Ventricle: The right ventricular size is normal. No increase in right ventricular wall thickness. Right ventricular systolic function is normal. Tricuspid regurgitation signal is inadequate for assessing PA pressure. Left Atrium: Left atrial size was normal in size. Right Atrium: Right atrial size was normal in size. Pericardium: There is no evidence of pericardial effusion. Mitral Valve: The mitral valve is normal in structure. Mild mitral valve regurgitation. No evidence of mitral valve stenosis. Tricuspid Valve: The tricuspid valve is normal in structure. Tricuspid valve regurgitation is not demonstrated. No evidence of  tricuspid stenosis. Aortic Valve: The aortic valve was not well visualized. Aortic valve regurgitation is not visualized. No aortic stenosis is present. Aortic valve peak gradient measures 6.0 mmHg. Pulmonic Valve: The pulmonic valve was normal in structure. Pulmonic valve regurgitation is not visualized. No evidence of pulmonic stenosis. Aorta: The aortic root is normal in size and structure. Venous: The inferior vena cava is normal in size with greater than 50% respiratory variability, suggesting right atrial pressure of 3 mmHg. IAS/Shunts: No atrial level shunt detected by color flow Doppler.  LEFT VENTRICLE PLAX 2D LVIDd:         5.00 cm     Diastology LVIDs:         3.95 cm     LV e' medial:    4.68 cm/s LV PW:         0.95 cm     LV E/e' medial:  24.4 LV IVS:        0.95 cm     LV e' lateral:   5.44 cm/s LVOT diam:     1.80 cm     LV E/e' lateral: 21.0 LV SV:         40 LV SV Index:   24 LVOT Area:     2.54 cm  LV Volumes (MOD) LV vol d, MOD A2C: 86.5 ml LV vol d, MOD A4C: 75.0 ml LV vol s, MOD A2C: 50.9 ml LV vol s, MOD A4C: 42.9 ml LV SV MOD A2C:     35.6 ml LV SV MOD A4C:     75.0 ml LV SV MOD BP:      33.7 ml RIGHT VENTRICLE RV S prime:     5.44 cm/s LEFT ATRIUM             Index LA diam:        2.50 cm 1.49 cm/m LA Vol (A2C):   26.7 ml 15.91 ml/m LA Vol (A4C):   23.5 ml 14.00 ml/m LA Biplane Vol: 26.6 ml 15.85 ml/m  AORTIC VALVE                 PULMONIC  VALVE AV Area (Vmax): 1.58 cm     RVOT Peak grad: 3 mmHg AV Vmax:        122.00 cm/s AV Peak Grad:   6.0 mmHg LVOT Vmax:      75.80 cm/s LVOT Vmean:     54.700 cm/s LVOT VTI:       0.159 m  AORTA Ao Root diam: 2.90 cm Ao Asc diam:  2.70 cm MITRAL VALVE MV Area (PHT): 3.79 cm     SHUNTS MV Decel Time: 200 msec     Systemic VTI:  0.16 m MV E velocity: 114.00 cm/s  Systemic Diam: 1.80 cm MV A velocity: 117.00 cm/s MV E/A ratio:  0.97 Ida Rogue MD Electronically signed by Ida Rogue MD Signature Date/Time: 07/09/2022/7:05:01 PM    Final     CARDIAC CATHETERIZATION  Result Date: 07/08/2022 Conclusions: Significant multivessel coronary artery disease, as detailed below.  Culprit lesion for the patient's NSTEMI is a hazy 99% stenosis of the mid LAD with TIMI-1 flow.  In addition, there is moderate disease involving the distal LMCA, proximal/mid LAD, and RCA, as detailed below. Moderately elevated left heart filling pressures (LVEDP 30 mmHg, PCWP 26 mmHg). Mildly-moderately elevated right heart filling pressures (mean RA 11 mmHg, RVEDP 14 mmHg). Normal Fick cardiac output/index (CO 4.6 L/min, CI 2.8 L/min/m). Successful PCI to 99% mid LAD stenosis using Onyx frontier 2.0 x 12 mm drug-eluting stent with 0% residual stenosis and improvement in flow from TIMI-1 to TIMI-2. 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 Furosemide 40 mg IV given x 1 at the end of the procedure.  Consider redosing as needed based on urine output.  Potassium repletion also ordered. Aggressive secondary prevention of coronary artery disease. Initiate goal-directed medical therapy for acute HFrEF, as tolerated.  In the setting of soft blood pressure, I will decrease losartan to 25 mg daily.  Once the patient appears euvolemic, rechallenging with beta-blocker will need to be considered. Nelva Bush, MD [facility]  DG Chest 1 View  Result Date: 07/07/2022 CLINICAL DATA:  Chest pain EXAM: CHEST  1 VIEW COMPARISON:  04/20/2021 FINDINGS: Heart and mediastinal contours within normal limits. Continued mild diffuse interstitial prominence, similar prior study. No acute confluent opacities or effusions. No acute bony abnormality. IMPRESSION: Stable mild interstitial prominence, favor chronic interstitial lung disease. No active disease. Electronically Signed   By: Rolm Baptise M.D.   On: 07/07/2022 20:17   MM 3D SCREEN BREAST BILATERAL  Result Date: 06/23/2022 CLINICAL DATA:  Screening. EXAM:  DIGITAL SCREENING BILATERAL MAMMOGRAM WITH TOMOSYNTHESIS AND CAD TECHNIQUE: Bilateral screening digital craniocaudal and mediolateral oblique mammograms were obtained. Bilateral screening digital breast tomosynthesis was performed. The images were evaluated with computer-aided detection. COMPARISON:  Previous exam(s). ACR Breast Density Category b: There are scattered areas of fibroglandular density. FINDINGS: There are no findings suspicious for malignancy. IMPRESSION: No mammographic evidence of malignancy. A result letter of this screening mammogram will be mailed directly to the patient. RECOMMENDATION: Screening mammogram in one year. (Code:[id number]-B-01Y) BI-RADS CATEGORY  1: Negative. Electronically Signed   By: Lajean Manes M.D.   On: 06/23/2022 13:47   (Echo, Carotid, EGD, Colonoscopy, ERCP)    Subjective: Pt denies any black stools   Discharge Exam: Vitals:   07/21/22 0412 07/21/22 0825  BP: (!) 112/52 (!) 138/58  Pulse: 79 92  Resp: 16 18  Temp: 98 F (36.7 C) 97.8 F (36.6 C)  SpO2:  97% 100%   Vitals:   07/20/22 1613 07/20/22 1927 07/21/22 0412 07/21/22 0825  BP: 102/73 (!) 120/50 (!) 112/52 (!) 138/58  Pulse: 89 89 79 92  Resp: 18 16 16 18   Temp: 98 F (36.7 C) 97.8 F (36.6 C) 98 F (36.7 C) 97.8 F (36.6 C)  TempSrc: Oral Oral Oral Oral  SpO2: 99% 97% 97% 100%  Weight:      Height:        General: Pt is alert, awake, not in acute distress Cardiovascular: S1/S2 +, no rubs, no gallops Respiratory: CTA bilaterally, no wheezing, no rhonchi Abdominal: Soft, NT, ND, bowel sounds + Extremities: no edema, no cyanosis    The results of significant diagnostics from this hospitalization (including imaging, microbiology, ancillary and laboratory) are listed below for reference.     Microbiology: Recent Results (from the past 240 hour(s))  Resp panel by RT-PCR (RSV, Flu A&B, Covid) Anterior Nasal Swab     Status: None   Collection Time: 07/19/22  1:36 PM   Specimen:  Anterior Nasal Swab  Result Value Ref Range Status   SARS Coronavirus 2 by RT PCR NEGATIVE NEGATIVE Final    Comment: (NOTE) SARS-CoV-2 target nucleic acids are NOT DETECTED.  The SARS-CoV-2 RNA is generally detectable in upper respiratory specimens during the acute phase of infection. The lowest concentration of SARS-CoV-2 viral copies this assay can detect is 138 copies/mL. A negative result does not preclude SARS-Cov-2 infection and should not be used as the sole basis for treatment or other patient management decisions. A negative result may occur with  improper specimen collection/handling, submission of specimen other than nasopharyngeal swab, presence of viral mutation(s) within the areas targeted by this assay, and inadequate number of viral copies(<138 copies/mL). A negative result must be combined with clinical observations, patient history, and epidemiological information. The expected result is Negative.  Fact Sheet for Patients:  EntrepreneurPulse.com.au  Fact Sheet for Healthcare Providers:  IncredibleEmployment.be  This test is no t yet approved or cleared by the [place] FDA and  has been authorized for detection and/or diagnosis of SARS-CoV-2 by FDA under an Emergency Use Authorization (EUA). This EUA will remain  in effect (meaning this test can be used) for the duration of the COVID-19 declaration under Section 564(b)(1) of the Act, 21 U.S.C.section 360bbb-3(b)(1), unless the authorization is terminated  or revoked sooner.       Influenza A by PCR NEGATIVE NEGATIVE Final   Influenza B by PCR NEGATIVE NEGATIVE Final    Comment: (NOTE) The Xpert Xpress SARS-CoV-2/FLU/RSV plus assay is intended as an aid in the diagnosis of influenza from Nasopharyngeal swab specimens and should not be used as a sole basis for treatment. Nasal washings and aspirates are unacceptable for Xpert Xpress SARS-CoV-2/FLU/RSV testing.  Fact  Sheet for Patients: EntrepreneurPulse.com.au  Fact Sheet for Healthcare Providers: IncredibleEmployment.be  This test is not yet approved or cleared by the [place] FDA and has been authorized for detection and/or diagnosis of SARS-CoV-2 by FDA under an Emergency Use Authorization (EUA). This EUA will remain in effect (meaning this test can be used) for the duration of the COVID-19 declaration under Section 564(b)(1) of the Act, 21 U.S.C. section 360bbb-3(b)(1), unless the authorization is terminated or revoked.     Resp Syncytial Virus by PCR NEGATIVE NEGATIVE Final    Comment: (NOTE) Fact Sheet for Patients: EntrepreneurPulse.com.au  Fact Sheet for Healthcare Providers: IncredibleEmployment.be  This test is not yet approved or cleared by the [place]  States FDA and has been authorized for detection and/or diagnosis of SARS-CoV-2 by FDA under an Emergency Use Authorization (EUA). This EUA will remain in effect (meaning this test can be used) for the duration of the COVID-19 declaration under Section 564(b)(1) of the Act, 21 U.S.C. section 360bbb-3(b)(1), unless the authorization is terminated or revoked.  Performed at [facility], [address] [place], [place] [address]      Labs: BNP (last 3 results) Recent Labs    07/19/22 1336  BNP [id number]*   Basic Metabolic Panel: Recent Labs  Lab 07/18/22 1634 07/19/22 1023 07/20/22 0654  NA 137 132* 134*  K 4.0 4.4 3.5  CL 98 98 103  CO2 19* 22 22  GLUCOSE 124* 126* 126*  BUN 13 14 15   CREATININE 0.82 0.71 0.78  CALCIUM 9.6 9.3 9.0   Liver Function Tests: Recent Labs  Lab 07/20/22 0654  AST 24  ALT 19  ALKPHOS 40  BILITOT 1.0  PROT 7.3  ALBUMIN 4.1   No results for input(s): "LIPASE", "AMYLASE" in the last 168 hours. No results for input(s): "AMMONIA" in the last 168 hours. CBC: Recent Labs  Lab 07/18/22 1634  07/19/22 1143 07/19/22 1143 07/19/22 1717 07/19/22 2307 07/20/22 0654 07/20/22 1254 07/21/22 0501  WBC 8.2 8.0  --   --   --   --   --  6.0  NEUTROABS 4.8 5.3  --   --   --   --   --   --   HGB 10.5* 10.2*   < > 10.4* 10.6* 10.6* 10.8* 10.3*  HCT 30.2* 29.8*   < > 30.8* 30.2* 30.2* 31.8* 30.2*  MCV 94 91.1  --   --   --   --   --  91.2  PLT 356 303  --   --   --   --   --  326   < > = values in this interval not displayed.   Cardiac Enzymes: No results for input(s): "CKTOTAL", "CKMB", "CKMBINDEX", "TROPONINI" in the last 168 hours. BNP: Invalid input(s): "POCBNP" CBG: Recent Labs  Lab 07/20/22 1947 07/20/22 2316 07/21/22 0416 07/21/22 0826 07/21/22 1145  GLUCAP 150* 89 127* 127* 139*   D-Dimer No results for input(s): "DDIMER" in the last 72 hours. Hgb A1c No results for input(s): "HGBA1C" in the last 72 hours. Lipid Profile No results for input(s): "CHOL", "HDL", "LDLCALC", "TRIG", "CHOLHDL", "LDLDIRECT" in the last 72 hours. Thyroid function studies No results for input(s): "TSH", "T4TOTAL", "T3FREE", "THYROIDAB" in the last 72 hours.  Invalid input(s): "FREET3" Anemia work up No results for input(s): "VITAMINB12", "FOLATE", "FERRITIN", "TIBC", "IRON", "RETICCTPCT" in the last 72 hours. Urinalysis    Component Value Date/Time   COLORURINE YELLOW 09/21/2019 1032   APPEARANCEUR CLEAR 09/21/2019 1032   APPEARANCEUR Cloudy (A) 11/01/2012 1418   LABSPEC 1.025 09/21/2019 1032   PHURINE 6.0 09/21/2019 1032   GLUCOSEU NEGATIVE 09/21/2019 1032   HGBUR NEGATIVE 09/21/2019 1032   BILIRUBINUR neg 07/18/2022 1627   BILIRUBINUR Negative 11/01/2012 1418   KETONESUR 15 (A) 09/21/2019 1032   PROTEINUR Negative 07/18/2022 1627   PROTEINUR NEGATIVE 09/21/2019 1032   UROBILINOGEN 0.2 07/18/2022 1627   NITRITE neg 07/18/2022 1627   NITRITE NEGATIVE 09/21/2019 1032   LEUKOCYTESUR Negative 07/18/2022 1627   LEUKOCYTESUR SMALL (A) 09/21/2019 1032   Sepsis Labs Recent Labs   Lab 07/18/22 1634 07/19/22 1143 07/21/22 0501  WBC 8.2 8.0 6.0   Microbiology Recent Results (from the past  240 hour(s))  Resp panel by RT-PCR (RSV, Flu A&B, Covid) Anterior Nasal Swab     Status: None   Collection Time: 07/19/22  1:36 PM   Specimen: Anterior Nasal Swab  Result Value Ref Range Status   SARS Coronavirus 2 by RT PCR NEGATIVE NEGATIVE Final    Comment: (NOTE) SARS-CoV-2 target nucleic acids are NOT DETECTED.  The SARS-CoV-2 RNA is generally detectable in upper respiratory specimens during the acute phase of infection. The lowest concentration of SARS-CoV-2 viral copies this assay can detect is 138 copies/mL. A negative result does not preclude SARS-Cov-2 infection and should not be used as the sole basis for treatment or other patient management decisions. A negative result may occur with  improper specimen collection/handling, submission of specimen other than nasopharyngeal swab, presence of viral mutation(s) within the areas targeted by this assay, and inadequate number of viral copies(<138 copies/mL). A negative result must be combined with clinical observations, patient history, and epidemiological information. The expected result is Negative.  Fact Sheet for Patients:  EntrepreneurPulse.com.au  Fact Sheet for Healthcare Providers:  IncredibleEmployment.be  This test is no t yet approved or cleared by the [place] FDA and  has been authorized for detection and/or diagnosis of SARS-CoV-2 by FDA under an Emergency Use Authorization (EUA). This EUA will remain  in effect (meaning this test can be used) for the duration of the COVID-19 declaration under Section 564(b)(1) of the Act, 21 U.S.C.section 360bbb-3(b)(1), unless the authorization is terminated  or revoked sooner.       Influenza A by PCR NEGATIVE NEGATIVE Final   Influenza B by PCR NEGATIVE NEGATIVE Final    Comment: (NOTE) The Xpert Xpress  SARS-CoV-2/FLU/RSV plus assay is intended as an aid in the diagnosis of influenza from Nasopharyngeal swab specimens and should not be used as a sole basis for treatment. Nasal washings and aspirates are unacceptable for Xpert Xpress SARS-CoV-2/FLU/RSV testing.  Fact Sheet for Patients: EntrepreneurPulse.com.au  Fact Sheet for Healthcare Providers: IncredibleEmployment.be  This test is not yet approved or cleared by the [place] FDA and has been authorized for detection and/or diagnosis of SARS-CoV-2 by FDA under an Emergency Use Authorization (EUA). This EUA will remain in effect (meaning this test can be used) for the duration of the COVID-19 declaration under Section 564(b)(1) of the Act, 21 U.S.C. section 360bbb-3(b)(1), unless the authorization is terminated or revoked.     Resp Syncytial Virus by PCR NEGATIVE NEGATIVE Final    Comment: (NOTE) Fact Sheet for Patients: EntrepreneurPulse.com.au  Fact Sheet for Healthcare Providers: IncredibleEmployment.be  This test is not yet approved or cleared by the [place] FDA and has been authorized for detection and/or diagnosis of SARS-CoV-2 by FDA under an Emergency Use Authorization (EUA). This EUA will remain in effect (meaning this test can be used) for the duration of the COVID-19 declaration under Section 564(b)(1) of the Act, 21 U.S.C. section 360bbb-3(b)(1), unless the authorization is terminated or revoked.  Performed at [facility], [address], [place], [place] [address]      Time coordinating discharge: Over 30 minutes  SIGNED:   Wyvonnia Dusky, MD  [facility] 07/21/2022, 1:00 PM Pager   If 7PM-7AM, please contact night-coverage

## 2022-07-21 NOTE — Progress Notes (Signed)
   Heart Failure Nurse Navigator Note  Met with patient today.  She was sitting up in chair at bedside in no acuate distress.  States she hopes she is going home today, she feels she is ready.  By teach back discussed heart failure, needs very little re enforcement.  She states she has started to read the education packet.  She has no further questions.  Pricilla Riffle RN CHFN

## 2022-07-21 NOTE — Progress Notes (Signed)
Rounding Note    Patient Name: Jessica Wheeler Date of Encounter: 07/21/2022  [place] Cardiologist: Ida Rogue, MD   Subjective   Reports that she feels well Hemoglobin stable 10.3 On IV PPI Reports stool is brown, normal color Denies significant chest pain or shortness of breath, would like to go home Reports using NSAIDs at home  Inpatient Medications    Scheduled Meds:  aspirin EC  81 mg Oral Daily   atorvastatin  80 mg Oral QHS   clopidogrel  75 mg Oral Daily   insulin aspart  0-9 Units Subcutaneous Q4H   losartan  25 mg Oral Daily   Continuous Infusions:  pantoprazole 8 mg/hr (07/21/22 0508)   PRN Meds: cyclobenzaprine, ondansetron **OR** ondansetron (ZOFRAN) IV   Vital Signs    Vitals:   07/20/22 1613 07/20/22 1927 07/21/22 0412 07/21/22 0825  BP: 102/73 (!) 120/50 (!) 112/52 (!) 138/58  Pulse: 89 89 79 92  Resp: 18 16 16 18   Temp: 98 F (36.7 C) 97.8 F (36.6 C) 98 F (36.7 C) 97.8 F (36.6 C)  TempSrc: Oral Oral Oral Oral  SpO2: 99% 97% 97% 100%  Weight:      Height:        Intake/Output Summary (Last 24 hours) at 07/21/2022 1316 Last data filed at 07/21/2022 0900 Gross per 24 hour  Intake 1765.14 ml  Output 800 ml  Net 965.14 ml      07/19/2022    7:51 PM 07/19/2022   10:19 AM 07/18/2022    3:20 PM  Last 3 Weights  Weight (lbs) 135 lb 9.3 oz 136 lb 137 lb  Weight (kg) 61.5 kg 61.689 kg 62.143 kg      Telemetry    Normal sinus rhythm- Personally Reviewed  ECG    - Personally Reviewed  Physical Exam   GEN: No acute distress.   Neck: No JVD Cardiac: RRR, no murmurs, rubs, or gallops.  Respiratory: Clear to auscultation bilaterally. GI: Soft, nontender, non-distended  MS: No edema; No deformity. Neuro:  Nonfocal  Psych: Normal affect   Labs    High Sensitivity Troponin:   Recent Labs  Lab 07/07/22 1959 07/07/22 2158 07/19/22 1023 07/19/22 1336  TROPONINIHS 502* 6,442* 48* 58*     Chemistry Recent  Labs  Lab 07/18/22 1634 07/19/22 1023 07/20/22 0654  NA 137 132* 134*  K 4.0 4.4 3.5  CL 98 98 103  CO2 19* 22 22  GLUCOSE 124* 126* 126*  BUN 13 14 15   CREATININE 0.82 0.71 0.78  CALCIUM 9.6 9.3 9.0  PROT  --   --  7.3  ALBUMIN  --   --  4.1  AST  --   --  24  ALT  --   --  19  ALKPHOS  --   --  40  BILITOT  --   --  1.0  GFRNONAA  --  >60 >60  ANIONGAP  --  12 9    Lipids No results for input(s): "CHOL", "TRIG", "HDL", "LABVLDL", "LDLCALC", "CHOLHDL" in the last 168 hours.  Hematology Recent Labs  Lab 07/18/22 1634 07/19/22 1143 07/19/22 1717 07/20/22 0654 07/20/22 1254 07/21/22 0501  WBC 8.2 8.0  --   --   --  6.0  RBC 3.23* 3.27*  --   --   --  3.31*  HGB 10.5* 10.2*   < > 10.6* 10.8* 10.3*  HCT 30.2* 29.8*   < > 30.2* 31.8* 30.2*  MCV  94 91.1  --   --   --  91.2  MCH 32.5 31.2  --   --   --  31.1  MCHC 34.8 34.2  --   --   --  34.1  RDW 12.9 13.7  --   --   --  13.7  PLT 356 303  --   --   --  326   < > = values in this interval not displayed.   Thyroid No results for input(s): "TSH", "FREET4" in the last 168 hours.  BNP Recent Labs  Lab 07/19/22 1336  BNP 730.6*    DDimer No results for input(s): "DDIMER" in the last 168 hours.   Radiology    CT Angio Chest PE W/Cm &/Or Wo Cm  Result Date: 07/19/2022 CLINICAL DATA:  High clinical suspicion for PE EXAM: CT ANGIOGRAPHY CHEST WITH CONTRAST TECHNIQUE: Multidetector CT imaging of the chest was performed using the standard protocol during bolus administration of intravenous contrast. Multiplanar CT image reconstructions and MIPs were obtained to evaluate the vascular anatomy. RADIATION DOSE REDUCTION: This exam was performed according to the departmental dose-optimization program which includes automated exposure control, adjustment of the mA and/or kV according to patient size and/or use of iterative reconstruction technique. CONTRAST:  63mL OMNIPAQUE IOHEXOL 350 MG/ML SOLN COMPARISON:  Previous studies  including the chest radiograph done earlier today FINDINGS: Cardiovascular: There are no intraluminal filling defects in pulmonary artery branches. There is homogeneous enhancement in thoracic aorta. There are scattered atherosclerotic plaques and calcifications in thoracic aorta and its major branches. Coronary artery calcifications are seen. Mediastinum/Nodes: No significant lymphadenopathy is seen. Lungs/Pleura: There is no focal pulmonary consolidation. There is slight increase in interstitial markings in the periphery of the lower lung fields, more so on the left side. Left hemidiaphragm is elevated. There is no pleural effusion or pneumothorax. Upper Abdomen: No acute findings are seen. Musculoskeletal: Unremarkable. Review of the MIP images confirms the above findings. IMPRESSION: There is no evidence of pulmonary artery embolism. There is no evidence of thoracic aortic dissection. Atherosclerosis. Coronary artery disease. There is no focal pulmonary consolidation. There is no pleural effusion or pneumothorax. Electronically Signed   By: Elmer Picker M.D.   On: 07/19/2022 14:35    Cardiac Studies     Patient Profile     82 year old with heart failure with reduced ejection fraction (35 to 40%), coronary artery disease with multivessel CAD status post PCI to the mid LAD on July 03, 2022 presenting with melenic stools and a slight drop in hemoglobin.   Assessment & Plan    Melena Had symptoms at home for 3 days, Hemoglobin stable, 10.3 Reports recent bowel movements are normal in color Appears to be tolerating aspirin and Plavix in the setting of recent PCI/DES to the LAD  No plans for EGD colonoscopy at this time per [facility]II  medical management recommended  plan to continue PPI   coronary disease, recent STEMI  no recent chest pain or shortness of breath concerning for angina  Brilinta changed to Plavix loading dose March 19  plan is to continue aspirin with Plavix, continue Lipitor   78  has previously had statin myalgias, she is willing to retry   Cardiomyopathy  ejection fraction estimated 35 to 40% July 09, 2022  continue losartan 25 daily,  intolerant of beta-blockers, not on SGLT2 inhibitor secondary to side effects  appears euvolemic, currently not on loop diuretics   Total encounter time more than 50 minutes  Greater than 50% was spent in counseling and coordination of care with the patient   For questions or updates, please contact [place] Please consult [email or web address] for contact info under        Signed, Ida Rogue, MD  07/21/2022, 1:16 PM

## 2022-07-21 NOTE — H&P (Signed)
Jonathon Bellows , MD [address] [place], [place], [place], [address] [address], [place], [place], [place], [address] Phone: [phone]  Fax: [phone]   SHANEE DAWDY is being followed for gi bleed  Day 1 of follow up   Subjective: Doing well, stools are brown in color.  No other complaints   Objective: Vital signs in last 24 hours: Vitals:   07/20/22 0742 07/20/22 1613 07/20/22 1927 07/21/22 0412  BP: (!) 124/52 102/73 (!) 120/50 (!) 112/52  Pulse: 91 89 89 79  Resp: 18 18 16 16   Temp: 97.8 F (36.6 C) 98 F (36.7 C) 97.8 F (36.6 C) 98 F (36.7 C)  TempSrc:  Oral Oral Oral  SpO2: 99% 99% 97% 97%  Weight:      Height:       Weight change:   Intake/Output Summary (Last 24 hours) at 07/21/2022 0815 Last data filed at 07/21/2022 0207 Gross per 24 hour  Intake 1285.14 ml  Output 800 ml  Net 485.14 ml     Exam: Heart:: Regular rate and rhythm Lungs: normal Abdomen: soft, nontender, normal bowel sounds   Lab Results: @LABTEST2 @ Micro Results: Recent Results (from the past 240 hour(s))  Resp panel by RT-PCR (RSV, Flu A&B, Covid) Anterior Nasal Swab     Status: None   Collection Time: 07/19/22  1:36 PM   Specimen: Anterior Nasal Swab  Result Value Ref Range Status   SARS Coronavirus 2 by RT PCR NEGATIVE NEGATIVE Final    Comment: (NOTE) SARS-CoV-2 target nucleic acids are NOT DETECTED.  The SARS-CoV-2 RNA is generally detectable in upper respiratory specimens during the acute phase of infection. The lowest concentration of SARS-CoV-2 viral copies this assay can detect is 138 copies/mL. A negative result does not preclude SARS-Cov-2 infection and should not be used as the sole basis for treatment or other patient management decisions. A negative result may occur with  improper specimen collection/handling, submission of specimen other than nasopharyngeal swab, presence of viral mutation(s) within the areas targeted by this assay, and inadequate  number of viral copies(<138 copies/mL). A negative result must be combined with clinical observations, patient history, and epidemiological information. The expected result is Negative.  Fact Sheet for Patients:  EntrepreneurPulse.com.au  Fact Sheet for Healthcare Providers:  IncredibleEmployment.be  This test is no t yet approved or cleared by the [place] FDA and  has been authorized for detection and/or diagnosis of SARS-CoV-2 by FDA under an Emergency Use Authorization (EUA). This EUA will remain  in effect (meaning this test can be used) for the duration of the COVID-19 declaration under Section 564(b)(1) of the Act, 21 U.S.C.section 360bbb-3(b)(1), unless the authorization is terminated  or revoked sooner.       Influenza A by PCR NEGATIVE NEGATIVE Final   Influenza B by PCR NEGATIVE NEGATIVE Final    Comment: (NOTE) The Xpert Xpress SARS-CoV-2/FLU/RSV plus assay is intended as an aid in the diagnosis of influenza from Nasopharyngeal swab specimens and should not be used as a sole basis for treatment. Nasal washings and aspirates are unacceptable for Xpert Xpress SARS-CoV-2/FLU/RSV testing.  Fact Sheet for Patients: EntrepreneurPulse.com.au  Fact Sheet for Healthcare Providers: IncredibleEmployment.be  This test is not yet approved or cleared by the [place] FDA and has been authorized for detection and/or diagnosis of SARS-CoV-2 by FDA under an Emergency Use Authorization (EUA). This EUA will remain in effect (meaning this test can be used) for the duration of the COVID-19 declaration  under Section 564(b)(1) of the Act, 21 U.S.C. section 360bbb-3(b)(1), unless the authorization is terminated or revoked.     Resp Syncytial Virus by PCR NEGATIVE NEGATIVE Final    Comment: (NOTE) Fact Sheet for Patients: EntrepreneurPulse.com.au  Fact Sheet for Healthcare  Providers: IncredibleEmployment.be  This test is not yet approved or cleared by the [place] FDA and has been authorized for detection and/or diagnosis of SARS-CoV-2 by FDA under an Emergency Use Authorization (EUA). This EUA will remain in effect (meaning this test can be used) for the duration of the COVID-19 declaration under Section 564(b)(1) of the Act, 21 U.S.C. section 360bbb-3(b)(1), unless the authorization is terminated or revoked.  Performed at [facility], [address] [place], [place] [address]    Studies/Results: CT Angio Chest PE W/Cm &/Or Wo Cm  Result Date: 07/19/2022 CLINICAL DATA:  High clinical suspicion for PE EXAM: CT ANGIOGRAPHY CHEST WITH CONTRAST TECHNIQUE: Multidetector CT imaging of the chest was performed using the standard protocol during bolus administration of intravenous contrast. Multiplanar CT image reconstructions and MIPs were obtained to evaluate the vascular anatomy. RADIATION DOSE REDUCTION: This exam was performed according to the departmental dose-optimization program which includes automated exposure control, adjustment of the mA and/or kV according to patient size and/or use of iterative reconstruction technique. CONTRAST:  57mL OMNIPAQUE IOHEXOL 350 MG/ML SOLN COMPARISON:  Previous studies including the chest radiograph done earlier today FINDINGS: Cardiovascular: There are no intraluminal filling defects in pulmonary artery branches. There is homogeneous enhancement in thoracic aorta. There are scattered atherosclerotic plaques and calcifications in thoracic aorta and its major branches. Coronary artery calcifications are seen. Mediastinum/Nodes: No significant lymphadenopathy is seen. Lungs/Pleura: There is no focal pulmonary consolidation. There is slight increase in interstitial markings in the periphery of the lower lung fields, more so on the left side. Left hemidiaphragm is elevated. There is no pleural effusion  or pneumothorax. Upper Abdomen: No acute findings are seen. Musculoskeletal: Unremarkable. Review of the MIP images confirms the above findings. IMPRESSION: There is no evidence of pulmonary artery embolism. There is no evidence of thoracic aortic dissection. Atherosclerosis. Coronary artery disease. There is no focal pulmonary consolidation. There is no pleural effusion or pneumothorax. Electronically Signed   By: Elmer Picker M.D.   On: 07/19/2022 14:35   DG Chest 2 View  Result Date: 07/19/2022 CLINICAL DATA:  Shortness of breath.  Anticoagulated. EXAM: CHEST - 2 VIEW COMPARISON:  07/07/2022 FINDINGS: Cardiomegaly. Aortic atherosclerosis. Chronic pulmonary scarring. No evidence of active infiltrate, mass, effusion or collapse. Ordinary chronic degenerative changes affect the spine. IMPRESSION: Cardiomegaly. Aortic atherosclerosis. Chronic pulmonary scarring. No acute finding. Electronically Signed   By: Nelson Chimes M.D.   On: 07/19/2022 11:08   Medications: I have reviewed the patient's current medications. Scheduled Meds:  aspirin EC  81 mg Oral Daily   atorvastatin  80 mg Oral QHS   clopidogrel  75 mg Oral Daily   insulin aspart  0-9 Units Subcutaneous Q4H   losartan  25 mg Oral Daily   Continuous Infusions:  pantoprazole 8 mg/hr (07/21/22 0508)   PRN Meds:.cyclobenzaprine, ondansetron **OR** ondansetron (ZOFRAN) IV    Latest Ref Rng & Units 07/21/2022    5:01 AM 07/20/2022   12:54 PM 07/20/2022    6:54 AM  CBC  WBC 4.0 - 10.5 K/uL 6.0     Hemoglobin 12.0 - 15.0 g/dL 10.3  10.8  10.6   Hematocrit 36.0 - 46.0 % 30.2  31.8  30.2  Platelets 150 - 400 K/uL 326        Assessment: Principal Problem:   GIB (gastrointestinal bleeding) Active Problems:   Essential hypertension   NSTEMI (non-ST elevated myocardial infarction) ([place])   Type 2 diabetes mellitus without complications ([place])   Acute blood loss anemia   Chronic systolic CHF (congestive heart failure) ([facility])  MADDISEN BOYTE is a 82 y.o. y/o female with recent NSTEMI started on aspirin and Brilinta presents to the hospital with melena been consulted for GI bleed drop in hemoglobin of over a gram from baseline no significant elevation in BUN/creatinine ratio.  This could be a lower GI bleed particularly right colon but with melena could also be an upper GI bleed although the evidence does not support the same.  Brilinta has been held , still on plavix patient has been evaluated by cardiology   Plan 1.  Monitor CBC transfuse as needed 2.  Continue on PPI at discharge to prevent repeat bleeding episode 3.  Hb stable today and as disucssed with cardiology present strategy would be wait and watch approach due to risks of endoscopy after recent NSTEMi as well as plavix being on board due to recent stenting.  I will sign off.  Please call me if any further GI concerns or questions.  We would like to thank you for the opportunity to participate in the care of Jessica Wheeler.     LOS: 2 days   Jonathon Bellows, MD 07/21/2022, 8:15 AM

## 2022-07-22 ENCOUNTER — Ambulatory Visit: Payer: Self-pay | Admitting: *Deleted

## 2022-07-22 ENCOUNTER — Other Ambulatory Visit: Payer: Self-pay | Admitting: Family Medicine

## 2022-07-22 ENCOUNTER — Telehealth: Payer: Self-pay | Admitting: *Deleted

## 2022-07-22 MED ORDER — ATORVASTATIN CALCIUM 80 MG PO TABS: 80.0000 mg | ORAL_TABLET | Freq: Every day | ORAL | 0 refills | Status: DC

## 2022-07-22 MED ORDER — LOSARTAN POTASSIUM 25 MG PO TABS: 25.0000 mg | ORAL_TABLET | Freq: Every day | ORAL | 0 refills | Status: DC

## 2022-07-22 NOTE — Transitions of Care (Post Inpatient/ED Visit) (Signed)
   07/22/2022  Name: Jessica Wheeler MRN: [id number] DOB: 01-28-1941  Today's TOC FU Call Status: Today's TOC FU Call Status:: Successful TOC FU Call Competed TOC FU Call Complete Date: 07/22/22  Transition Care Management Follow-up Telephone Call Date of Discharge: 07/21/22 Discharge Facility: [facility]) Type of Discharge: Inpatient Admission Primary Inpatient Discharge Diagnosis:: GI Bleed How have you been since you were released from the hospital?: Better (I feel much better. not as short of breath. I have walked to the mailbox and done some cooking today.) Any questions or concerns?: No  Items Reviewed: Did you receive and understand the discharge instructions provided?: Yes Medications obtained and verified?: Yes (Medications Reviewed) (My son is picking up my medications today because the pharmacy had run out yesterday.) Any new allergies since your discharge?: No Dietary orders reviewed?: Yes Type of Diet Ordered:: Low sodium heart healthy carb modified Do you have support at home?: Yes People in Home: facility resident Name of Support/Comfort Primary Source: Lanae Boast son  Home Care and Equipment/Supplies: [place] Ordered?: No Any new equipment or medical supplies ordered?: No  Functional Questionnaire: Do you need assistance with bathing/showering or dressing?: No Do you need assistance with meal preparation?: No Do you need assistance with eating?: No Do you have difficulty maintaining continence: No Do you need assistance with getting out of bed/getting out of a chair/moving?: No Do you have difficulty managing or taking your medications?: No  Follow up appointments reviewed: PCP Follow-up appointment confirmed?: Yes Date of PCP follow-up appointment?: 08/02/22 Follow-up Provider: Dr [name] [facility] Follow-up appointment confirmed?: Yes Date of Specialist follow-up appointment?: 07/25/22 Follow-Up  Specialty Provider:: [id number] card rehab 9:00, [id number] Darylene Price 10:00, [id number] Gerrie Nordmann NP cardiology 2:20 Do you need transportation to your follow-up appointment?: No Do you understand care options if your condition(s) worsen?: Yes-patient verbalized understanding  SDOH Interventions Today    Flowsheet Row Most Recent Value  SDOH Interventions   Food Insecurity Interventions Intervention Not Indicated  Housing Interventions Intervention Not Indicated  Transportation Interventions Intervention Not Indicated      Interventions Today    Flowsheet Row Most Recent Value  General Interventions   General Interventions Discussed/Reviewed General Interventions Discussed, General Interventions Reviewed, Doctor Visits  Doctor Visits Discussed/Reviewed Doctor Visits Discussed, Doctor Visits Reviewed, Specialist, Annual Wellness Visits  PCP/Specialist Visits Compliance with follow-up visit, Contact provider for referral to  Exercise Interventions   Exercise Discussed/Reviewed Exercise Discussed  [Cardiac rehab is scheduled]      TOC Interventions Today    Flowsheet Row Most Recent Value  TOC Interventions   TOC Interventions Discussed/Reviewed TOC Interventions Discussed, TOC Interventions Reviewed       [facility] [facility] [phone]

## 2022-07-22 NOTE — Patient Outreach (Signed)
  Care Coordination   Follow Up Visit Note   07/22/2022 Name: Jessica Wheeler MRN: [id number] DOB: 1940/09/04  Jessica Wheeler is a 82 y.o. year old female who sees Bacigalupo, [name], MD for primary care. I spoke with  Jessica Wheeler by phone today.  What matters to the patients health and wellness today?  Patient has had 2 admission in the last month for NSTEMI with stent placement and GI bleed due to anticoagulant therapy, discharged recently yesterday.  Patient report she feels she is doing well, will start cardiac rehab on Monday.  TOC call completed today by F. Pleasant, RN, collaborated with CCM team who will follow up next week, assigned to Jessica Wheeler, Therapist, sports.  Denies any urgent concerns, encouraged to contact this care manager with questions.        SDOH assessments and interventions completed:  No     Care Coordination Interventions:  Yes, provided   Interventions Today    Flowsheet Row Most Recent Value  Chronic Disease   Chronic disease during today's visit Other  [GI bleed]  General Interventions   General Interventions Discussed/Reviewed Communication with  Communication with RN        Follow up plan: No further intervention required.   Encounter Outcome:  Pt. Visit Completed   Jessica David, RN, MSN, [place] [facility] Care Management Coordinator [phone]

## 2022-07-25 ENCOUNTER — Encounter: Payer: Medicare HMO | Admitting: Family Medicine

## 2022-07-25 ENCOUNTER — Encounter: Payer: Medicare HMO | Admitting: *Deleted

## 2022-07-25 VITALS — Ht 62.75 in | Wt 134.9 lb

## 2022-07-25 DIAGNOSIS — I214 Non-ST elevation (NSTEMI) myocardial infarction: Secondary | ICD-10-CM

## 2022-07-25 DIAGNOSIS — Z48812 Encounter for surgical aftercare following surgery on the circulatory system: Secondary | ICD-10-CM | POA: Diagnosis not present

## 2022-07-25 DIAGNOSIS — Z955 Presence of coronary angioplasty implant and graft: Secondary | ICD-10-CM | POA: Diagnosis not present

## 2022-07-25 DIAGNOSIS — I252 Old myocardial infarction: Secondary | ICD-10-CM | POA: Diagnosis not present

## 2022-07-25 NOTE — Progress Notes (Signed)
Cardiac Individual Treatment Plan  Patient Details  Name: Jessica Wheeler MRN: [id number] Date of Birth: 04-13-1941 Referring Provider:   Flowsheet Row Cardiac Rehab from 07/25/2022 in [facility] Cardiac and Pulmonary Rehab  Referring Provider End, Harrell Gave MD       Initial Encounter Date:  Flowsheet Row Cardiac Rehab from 07/25/2022 in [facility] Cardiac and Pulmonary Rehab  Date 07/25/22       Visit Diagnosis: NSTEMI (non-ST elevated myocardial infarction) [facility])  Status post coronary artery stent placement  Patient's Home Medications on Admission:  Current Outpatient Medications:    Accu-Chek FastClix Lancets MISC, TEST FASTING BLOOD SUGAR EVERY DAY, Disp: 102 each, Rfl: 3   aspirin 81 MG EC tablet, Take 81 mg by mouth daily., Disp: , Rfl:    atorvastatin (LIPITOR) 80 MG tablet, Take 1 tablet (80 mg total) by mouth at bedtime., Disp: 90 tablet, Rfl: 0   Blood Glucose Monitoring Suppl (ACCU-CHEK GUIDE) w/Device KIT, USE AS DIRECTED TO CHECK FASTING BLOOD SUGAR DAILY, Disp: 1 kit, Rfl: 0   clopidogrel (PLAVIX) 75 MG tablet, Take 1 tablet (75 mg total) by mouth daily., Disp: 30 tablet, Rfl: 0   cyclobenzaprine (FLEXERIL) 5 MG tablet, Take ONE tab PO BID/TID PRN, Disp: , Rfl:    glucose blood (ACCU-CHEK GUIDE) test strip, TEST FASTING BLOOD SUGAR EVERY DAY AS DIRECTED, Disp: 100 strip, Rfl: 2   Lancets Misc. (ACCU-CHEK FASTCLIX LANCET) KIT, 1 each by Does not apply route as directed., Disp: 1 kit, Rfl: 1   latanoprost (XALATAN) 0.005 % ophthalmic solution, Place 1 drop into both eyes at bedtime. , Disp: , Rfl: 3   losartan (COZAAR) 25 MG tablet, Take 1 tablet (25 mg total) by mouth daily., Disp: 90 tablet, Rfl: 0   Magnesium 250 MG TABS, Take 250 mg by mouth daily., Disp: , Rfl:    metFORMIN (GLUCOPHAGE) 500 MG tablet, TAKE 2 TABLETS TWICE DAILY WITH MEALS, Disp: 360 tablet, Rfl: 1   pantoprazole (PROTONIX) 40 MG tablet, Take 1 tablet (40 mg total) by mouth daily., Disp: 30 tablet, Rfl: 0    SHINGRIX injection, , Disp: , Rfl:    vitamin B-12 (CYANOCOBALAMIN) 1000 MCG tablet, Take 1,000 mcg by mouth daily., Disp: , Rfl:   Past Medical History: Past Medical History:  Diagnosis Date   Allergy    Arthritis    Bundle branch block, right    C7 cervical fracture (HCC)    Diabetes mellitus    Diffuse cystic mastopathy    Dissection, aorta (HCC)    Dr.Dew follows   Diverticulosis    Family history of malignant neoplasm of gastrointestinal tract    Glaucoma 2005   Heart murmur    History of bronchitis    History of pancreatitis 2005   Hyperlipidemia    Hypertension 1982   Personal history of tobacco use, presenting hazards to health    RBBB    Ulcer     Tobacco Use: Social History   Tobacco Use  Smoking Status Former   Packs/day: 1.00   Years: 15.00   Additional pack years: 0.00   Total pack years: 15.00   Types: Cigarettes   Quit date: 08/03/2003   Years since quitting: 18.9  Smokeless Tobacco Never    Labs: Review Flowsheet  More data exists      Latest Ref Rng & Units 07/20/2021 10/22/2021 01/20/2022 07/07/2022 07/08/2022  Labs for ITP Cardiac and Pulmonary Rehab  Cholestrol 0 - 200 mg/dL - - - - 197  LDL (calc) 0 - 99 mg/dL - - - - 82   HDL-C >40 mg/dL - - - - 103   Trlycerides <150 mg/dL - - - - 62   Hemoglobin A1c 4.8 - 5.6 % 6.8  7.0  7.1  7.1  -  PH, Arterial 7.35 - 7.45 - - - - 7.373   PCO2 arterial 32 - 48 mmHg - - - - 35.3   Bicarbonate 20.0 - 28.0 mmol/L - - - - 20.6  21.7   TCO2 22 - 32 mmol/L - - - - 22  23   Acid-base deficit 0.0 - 2.0 mmol/L - - - - 4.0  4.0   O2 Saturation % - - - - 92  63      Exercise Target Goals: Exercise Program Goal: Individual exercise prescription set using results from initial 6 min walk test and THRR while considering  patient's activity barriers and safety.   Exercise Prescription Goal: Initial exercise prescription builds to 30-45 minutes a day of aerobic activity, 2-3 days per week.  Home exercise guidelines  will be given to patient during program as part of exercise prescription that the participant will acknowledge.   Education: Aerobic Exercise: - Group verbal and visual presentation on the components of exercise prescription. Introduces F.I.T.T principle from ACSM for exercise prescriptions.  Reviews F.I.T.T. principles of aerobic exercise including progression. Written material given at graduation. Flowsheet Row Cardiac Rehab from 07/25/2022 in [facility] Cardiac and Pulmonary Rehab  Education need identified 07/25/22       Education: Resistance Exercise: - Group verbal and visual presentation on the components of exercise prescription. Introduces F.I.T.T principle from ACSM for exercise prescriptions  Reviews F.I.T.T. principles of resistance exercise including progression. Written material given at graduation.    Education: Exercise & Equipment Safety: - Individual verbal instruction and demonstration of equipment use and safety with use of the equipment. Flowsheet Row Cardiac Rehab from 07/25/2022 in [facility] Cardiac and Pulmonary Rehab  Date 07/18/22  Educator [facility]  Instruction Review Code 1- Verbalizes Understanding       Education: Exercise Physiology & General Exercise Guidelines: - Group verbal and written instruction with models to review the exercise physiology of the cardiovascular system and associated critical values. Provides general exercise guidelines with specific guidelines to those with heart or lung disease.    Education: Flexibility, Balance, Mind/Body Relaxation: - Group verbal and visual presentation with interactive activity on the components of exercise prescription. Introduces F.I.T.T principle from ACSM for exercise prescriptions. Reviews F.I.T.T. principles of flexibility and balance exercise training including progression. Also discusses the mind body connection.  Reviews various relaxation techniques to help reduce and manage stress (i.e. Deep breathing, progressive  muscle relaxation, and visualization). Balance handout provided to take home. Written material given at graduation.   Activity Barriers & Risk Stratification:  Activity Barriers & Cardiac Risk Stratification - 07/25/22 1434       Activity Barriers & Cardiac Risk Stratification   Activity Barriers Arthritis;Neck/Spine Problems;Back Problems;Balance Concerns;Muscular Weakness;Deconditioning;Shortness of Breath   back surgery x3, 2 cervical cage, 1 low back   Cardiac Risk Stratification Moderate             6 Minute Walk:  6 Minute Walk     Row Name 07/25/22 1433         6 Minute Walk   Phase Initial     Distance 892 feet     Walk Time 6 minutes     # of Rest Breaks  0     MPH 1.69     METS 2.63     RPE 11     Perceived Dyspnea  1     VO2 Peak 7.92     Symptoms Yes (comment)     Comments tired, chest tightness 1/10, SOB     Resting HR 88 bpm     Resting BP 146/68     Resting Oxygen Saturation  99 %     Exercise Oxygen Saturation  during 6 min walk 94 %     Max Ex. HR 122 bpm     Max Ex. BP 184/86     2 Minute Post BP 146/74              Oxygen Initial Assessment:   Oxygen Re-Evaluation:   Oxygen Discharge (Final Oxygen Re-Evaluation):   Initial Exercise Prescription:  Initial Exercise Prescription - 07/25/22 1400       Date of Initial Exercise RX and Referring Provider   Date 07/25/22    Referring Provider End, Harrell Gave MD      Oxygen   Maintain Oxygen Saturation 88% or higher      Treadmill   MPH 1.6    Grade 0    Minutes 15    METs 2.23      REL-XR   Level 1    Speed 50    Minutes 15    METs 2      T5 Nustep   Level 1    SPM 80    Minutes 15    METs 2      Track   Laps 23    Minutes 15    METs 2.23      Prescription Details   Frequency (times per week) 3    Duration Progress to 30 minutes of continuous aerobic without signs/symptoms of physical distress      Intensity   THRR 40-80% of Max Heartrate 107-127    Ratings  of Perceived Exertion 11-13    Perceived Dyspnea 0-4      Progression   Progression Continue to progress workloads to maintain intensity without signs/symptoms of physical distress.      Resistance Training   Training Prescription Yes    Weight 3 lb    Reps 10-15             Perform Capillary Blood Glucose checks as needed.  Exercise Prescription Changes:   Exercise Prescription Changes     Row Name 07/25/22 1400             Response to Exercise   Blood Pressure (Admit) 146/68       Blood Pressure (Exercise) 184/86       Blood Pressure (Exit) 146/74       Heart Rate (Admit) 88 bpm       Heart Rate (Exercise) 122 bpm       Heart Rate (Exit) 89 bpm       Oxygen Saturation (Admit) 99 %       Oxygen Saturation (Exercise) 94 %       Rating of Perceived Exertion (Exercise) 11       Perceived Dyspnea (Exercise) 1       Symptoms tired, chest tightness 1/10, SOB       Comments walk test results                Exercise Comments:   Exercise Goals and Review:   Exercise Goals  [place] Name 07/25/22 1437             Exercise Goals   Increase Physical Activity Yes       Intervention Provide advice, education, support and counseling about physical activity/exercise needs.;Develop an individualized exercise prescription for aerobic and resistive training based on initial evaluation findings, risk stratification, comorbidities and participant's personal goals.       Expected Outcomes Short Term: Attend rehab on a regular basis to increase amount of physical activity.;Long Term: Add in home exercise to make exercise part of routine and to increase amount of physical activity.;Long Term: Exercising regularly at least 3-5 days a week.       Increase Strength and Stamina Yes       Intervention Provide advice, education, support and counseling about physical activity/exercise needs.;Develop an individualized exercise prescription for aerobic and resistive training based on  initial evaluation findings, risk stratification, comorbidities and participant's personal goals.       Expected Outcomes Short Term: Increase workloads from initial exercise prescription for resistance, speed, and METs.;Short Term: Perform resistance training exercises routinely during rehab and add in resistance training at home;Long Term: Improve cardiorespiratory fitness, muscular endurance and strength as measured by increased METs and functional capacity (6MWT)       Able to understand and use rate of perceived exertion (RPE) scale Yes       Intervention Provide education and explanation on how to use RPE scale       Expected Outcomes Short Term: Able to use RPE daily in rehab to express subjective intensity level;Long Term:  Able to use RPE to guide intensity level when exercising independently       Able to understand and use Dyspnea scale Yes       Intervention Provide education and explanation on how to use Dyspnea scale       Expected Outcomes Short Term: Able to use Dyspnea scale daily in rehab to express subjective sense of shortness of breath during exertion;Long Term: Able to use Dyspnea scale to guide intensity level when exercising independently       Knowledge and understanding of Target Heart Rate Range (THRR) Yes       Intervention Provide education and explanation of THRR including how the numbers were predicted and where they are located for reference       Expected Outcomes Short Term: Able to state/look up THRR;Short Term: Able to use daily as guideline for intensity in rehab;Long Term: Able to use THRR to govern intensity when exercising independently       Able to check pulse independently Yes       Intervention Review the importance of being able to check your own pulse for safety during independent exercise;Provide education and demonstration on how to check pulse in carotid and radial arteries.       Expected Outcomes Short Term: Able to explain why pulse checking is  important during independent exercise;Long Term: Able to check pulse independently and accurately       Understanding of Exercise Prescription Yes       Intervention Provide education, explanation, and written materials on patient's individual exercise prescription       Expected Outcomes Short Term: Able to explain program exercise prescription;Long Term: Able to explain home exercise prescription to exercise independently                Exercise Goals Re-Evaluation :   Discharge Exercise Prescription (Final Exercise Prescription Changes):  Exercise Prescription Changes -  07/25/22 1400       Response to Exercise   Blood Pressure (Admit) 146/68    Blood Pressure (Exercise) 184/86    Blood Pressure (Exit) 146/74    Heart Rate (Admit) 88 bpm    Heart Rate (Exercise) 122 bpm    Heart Rate (Exit) 89 bpm    Oxygen Saturation (Admit) 99 %    Oxygen Saturation (Exercise) 94 %    Rating of Perceived Exertion (Exercise) 11    Perceived Dyspnea (Exercise) 1    Symptoms tired, chest tightness 1/10, SOB    Comments walk test results             Nutrition:  Target Goals: Understanding of nutrition guidelines, daily intake of sodium 1500mg , cholesterol 200mg , calories 30% from fat and 7% or less from saturated fats, daily to have 5 or more servings of fruits and vegetables.  Education: All About Nutrition: -Group instruction provided by verbal, written material, interactive activities, discussions, models, and posters to present general guidelines for heart healthy nutrition including fat, fiber, MyPlate, the role of sodium in heart healthy nutrition, utilization of the nutrition label, and utilization of this knowledge for meal planning. Follow up email sent as well. Written material given at graduation. Flowsheet Row Cardiac Rehab from 07/25/2022 in [facility] Cardiac and Pulmonary Rehab  Education need identified 07/25/22       Biometrics:  Pre Biometrics - 07/25/22 1437        Pre Biometrics   Height 5' 2.75" (1.594 m)    Weight 134 lb 14.4 oz (61.2 kg)    Waist Circumference 32 inches    Hip Circumference 35 inches    Waist to Hip Ratio 0.91 %    BMI (Calculated) 24.08    Single Leg Stand 3.6 seconds              Nutrition Therapy Plan and Nutrition Goals:  Nutrition Therapy & Goals - 07/25/22 1051       Nutrition Therapy   Diet Heart healthy, low Na, T2DM    Drug/Food Interactions Statins/Certain Fruits    Protein (specify units) 75g    Fiber 25 grams    Whole Grain Foods 3 servings    Saturated Fats 12 max. grams    Fruits and Vegetables 8 servings/day    Sodium 2 grams      Personal Nutrition Goals   Nutrition Goal ST: LT:    Comments 82 y.o F admitted to Cardiac Rehab s/p NSTEMI with stent placement. PMHx includes HTN, CAD, CHF, T2DM. Medications reviewed includes atorvastatin, metformin, pantoprazole, vit 12. Shenitha reports eating for her diabetes health for a long time and feels she is doing well with that - she would like to talk more about heart healthy eating. Discussed heart healthy eating and considerations such as sodium, types of fat, and fiber. Reviewed MyPlate and [id number] friendly eating. Sumeya uses olive oil spray, eats grains like cheerios and oatmeal, she makes her own yeast bread, she grows a vegetable garden and eats a variety of non-starchy vegetables. Nivedita does not eat out often and makes many foods herslef; she enjoys stir-fry and homemade soups with lots of vegetables. She eats a variety of protein including salmon, poultry, beef (1-2x/week), and plant proteins like legumes and tofu. Roanna reports that she does not like salmon, but likes salmon cakes - suggested using a whole grain binder (she would like to use oats) and using airfryer to limit added fat.      Intervention  Plan   Intervention Prescribe, educate and counsel regarding individualized specific dietary modifications aiming towards targeted core components such as  weight, hypertension, lipid management, diabetes, heart failure and other comorbidities.;Nutrition handout(s) given to patient.    Expected Outcomes Short Term Goal: Understand basic principles of dietary content, such as calories, fat, sodium, cholesterol and nutrients.;Short Term Goal: A plan has been developed with personal nutrition goals set during dietitian appointment.;Long Term Goal: Adherence to prescribed nutrition plan.             Nutrition Assessments:  MEDIFICTS Score Key: ?70 Need to make dietary changes  40-70 Heart Healthy Diet ? 40 Therapeutic Level Cholesterol Diet  Flowsheet Row Cardiac Rehab from 07/25/2022 in [facility] Cardiac and Pulmonary Rehab  Picture Your Plate Total Score on Admission 72      Picture Your Plate Scores: [id number] Unhealthy dietary pattern with much room for improvement. 41-50 Dietary pattern unlikely to meet recommendations for good health and room for improvement. 51-60 More healthful dietary pattern, with some room for improvement.  >60 Healthy dietary pattern, although there may be some specific behaviors that could be improved.    Nutrition Goals Re-Evaluation:   Nutrition Goals Discharge (Final Nutrition Goals Re-Evaluation):   Psychosocial: Target Goals: Acknowledge presence or absence of significant depression and/or stress, maximize coping skills, provide positive support system. Participant is able to verbalize types and ability to use techniques and skills needed for reducing stress and depression.   Education: Stress, Anxiety, and Depression - Group verbal and visual presentation to define topics covered.  Reviews how body is impacted by stress, anxiety, and depression.  Also discusses healthy ways to reduce stress and to treat/manage anxiety and depression.  Written material given at graduation.   Education: Sleep Hygiene -Provides group verbal and written instruction about how sleep can affect your health.  Define sleep hygiene,  discuss sleep cycles and impact of sleep habits. Review good sleep hygiene tips.    Initial Review & Psychosocial Screening:  Initial Psych Review & Screening - 07/18/22 1030       Initial Review   Current issues with Current Stress Concerns      Family Dynamics   Good Support System? Yes    Comments She is frustrated that she layed in the hospital for awhile when she had her heart event. Her son is here in town and friends she can look to for support. She wnats to be able to get back on her lawnmower and do yardwork.      Barriers   Psychosocial barriers to participate in program The patient should benefit from training in stress management and relaxation.;There are no identifiable barriers or psychosocial needs.      Screening Interventions   Interventions Provide feedback about the scores to participant;To provide support and resources with identified psychosocial needs;Encouraged to exercise    Expected Outcomes Short Term goal: Utilizing psychosocial counselor, staff and physician to assist with identification of specific Stressors or current issues interfering with healing process. Setting desired goal for each stressor or current issue identified.;Long Term Goal: Stressors or current issues are controlled or eliminated.;Short Term goal: Identification and review with participant of any Quality of Life or Depression concerns found by scoring the questionnaire.;Long Term goal: The participant improves quality of Life and PHQ9 Scores as seen by post scores and/or verbalization of changes             Quality of Life Scores:   Quality of Life - 07/25/22 1438  Quality of Life   Select Quality of Life      Quality of Life Scores   Health/Function Pre 10.27 %    Socioeconomic Pre 25.94 %    Psych/Spiritual Pre 18.17 %    Family Pre 18.6 %    GLOBAL Pre 16.7 %            Scores of 19 and below usually indicate a poorer quality of life in these areas.  A difference of   2-3 points is a clinically meaningful difference.  A difference of 2-3 points in the total score of the Quality of Life Index has been associated with significant improvement in overall quality of life, self-image, physical symptoms, and general health in studies assessing change in quality of life.  PHQ-9: Review Flowsheet  More data exists      07/25/2022 07/12/2022 03/08/2022 01/20/2022 10/22/2021  Depression screen PHQ 2/9  Decreased Interest 0 0 1 1 0  Down, Depressed, Hopeless 0 0 0 0 0  PHQ - 2 Score 0 0 1 1 0  Altered sleeping 0 1 1 1 2   Tired, decreased energy 3 2 1 1 2   Change in appetite 3 0 1 1 1   Feeling bad or failure about yourself  0 0 0 0 0  Trouble concentrating 0 0 0 0 0  Moving slowly or fidgety/restless 0 0 0 0 0  Suicidal thoughts 0 0 0 0 0  PHQ-9 Score 6 3 4 4 5   Difficult doing work/chores Very difficult Not difficult at all Not difficult at all Not difficult at all Not difficult at all   Interpretation of Total Score  Total Score Depression Severity:  1-4 = Minimal depression, 5-9 = Mild depression, 10-14 = Moderate depression, 15-19 = Moderately severe depression, 20-27 = Severe depression   Psychosocial Evaluation and Intervention:  Psychosocial Evaluation - 07/18/22 1035       Psychosocial Evaluation & Interventions   Interventions Encouraged to exercise with the program and follow exercise prescription;Relaxation education;Stress management education    Comments She is frustrated that she layed in the hospital for awhile when she had her heart event. Her son is here in town and friends she can look to for support. She wnats to be able to get back on her lawnmower and do yardwork.    Expected Outcomes Short: Start HeartTrack to help with mood. Long: Maintain a healthy mental state    Continue Psychosocial Services  Follow up required by staff             Psychosocial Re-Evaluation:   Psychosocial Discharge (Final Psychosocial  Re-Evaluation):   Vocational Rehabilitation: Provide vocational rehab assistance to qualifying candidates.   Vocational Rehab Evaluation & Intervention:  Vocational Rehab - 07/25/22 1439       Initial Vocational Rehab Evaluation & Intervention   Assessment shows need for Vocational Rehabilitation No   retired            Education: Education Goals: Education classes will be provided on a variety of topics geared toward better understanding of heart health and risk factor modification. Participant will state understanding/return demonstration of topics presented as noted by education test scores.  Learning Barriers/Preferences:  Learning Barriers/Preferences - 07/18/22 1027       Learning Barriers/Preferences   Learning Barriers None    Learning Preferences None             General Cardiac Education Topics:  AED/CPR: - Group verbal and written instruction with the  use of models to demonstrate the basic use of the AED with the basic ABC's of resuscitation.   Anatomy and Cardiac Procedures: - Group verbal and visual presentation and models provide information about basic cardiac anatomy and function. Reviews the testing methods done to diagnose heart disease and the outcomes of the test results. Describes the treatment choices: Medical Management, Angioplasty, or Coronary Bypass Surgery for treating various heart conditions including Myocardial Infarction, Angina, Valve Disease, and Cardiac Arrhythmias.  Written material given at graduation. Flowsheet Row Cardiac Rehab from 07/25/2022 in [facility] Cardiac and Pulmonary Rehab  Education need identified 07/25/22       Medication Safety: - Group verbal and visual instruction to review commonly prescribed medications for heart and lung disease. Reviews the medication, class of the drug, and side effects. Includes the steps to properly store meds and maintain the prescription regimen.  Written material given at  graduation.   Intimacy: - Group verbal instruction through game format to discuss how heart and lung disease can affect sexual intimacy. Written material given at graduation..   Know Your Numbers and Heart Failure: - Group verbal and visual instruction to discuss disease risk factors for cardiac and pulmonary disease and treatment options.  Reviews associated critical values for Overweight/Obesity, Hypertension, Cholesterol, and Diabetes.  Discusses basics of heart failure: signs/symptoms and treatments.  Introduces Heart Failure Zone chart for action plan for heart failure.  Written material given at graduation.   Infection Prevention: - Provides verbal and written material to individual with discussion of infection control including proper hand washing and proper equipment cleaning during exercise session. Flowsheet Row Cardiac Rehab from 07/25/2022 in [facility] [facility]  Date 07/18/22  Educator [facility]  Instruction Review Code 1- Verbalizes Understanding       Falls Prevention: - Provides verbal and written material to individual with discussion of falls prevention and safety. Flowsheet Row Cardiac Rehab from 07/25/2022 in [facility] Cardiac and Pulmonary Rehab  Date 07/18/22  Educator [facility]  Instruction Review Code 1- Verbalizes Understanding       Other: -Provides group and verbal instruction on various topics (see comments)   Knowledge Questionnaire Score:  Knowledge Questionnaire Score - 07/25/22 1439       Knowledge Questionnaire Score   Pre Score 23/26             Core Components/Risk Factors/Patient Goals at Admission:  Personal Goals and Risk Factors at Admission - 07/25/22 1439       Core Components/Risk Factors/Patient Goals on Admission    Weight Management Yes;Weight Maintenance    Intervention Weight Management: Develop a combined nutrition and exercise program designed to reach desired caloric intake, while maintaining appropriate intake of  nutrient and fiber, sodium and fats, and appropriate energy expenditure required for the weight goal.;Weight Management: Provide education and appropriate resources to help participant work on and attain dietary goals.;Weight Management/Obesity: Establish reasonable short term and long term weight goals.    Admit Weight 134 lb 14.4 oz (61.2 kg)    Goal Weight: Short Term 134 lb (60.8 kg)    Goal Weight: Long Term 134 lb (60.8 kg)    Expected Outcomes Short Term: Continue to assess and modify interventions until short term weight is achieved;Long Term: Adherence to nutrition and physical activity/exercise program aimed toward attainment of established weight goal;Weight Maintenance: Understanding of the daily nutrition guidelines, which includes 25-35% calories from fat, 7% or less cal from saturated fats, less than 200mg  cholesterol, less than 1.5gm of sodium, &  5 or more servings of fruits and vegetables daily    Diabetes Yes    Intervention Provide education about signs/symptoms and action to take for hypo/hyperglycemia.;Provide education about proper nutrition, including hydration, and aerobic/resistive exercise prescription along with prescribed medications to achieve blood glucose in normal ranges: Fasting glucose 65-99 mg/dL    Expected Outcomes Short Term: Participant verbalizes understanding of the signs/symptoms and immediate care of hyper/hypoglycemia, proper foot care and importance of medication, aerobic/resistive exercise and nutrition plan for blood glucose control.;Long Term: Attainment of HbA1C < 7%.    Heart Failure Yes    Intervention Provide a combined exercise and nutrition program that is supplemented with education, support and counseling about heart failure. Directed toward relieving symptoms such as shortness of breath, decreased exercise tolerance, and extremity edema.    Expected Outcomes Improve functional capacity of life;Short term: Attendance in program 2-3 days a week with  increased exercise capacity. Reported lower sodium intake. Reported increased fruit and vegetable intake. Reports medication compliance.;Short term: Daily weights obtained and reported for increase. Utilizing diuretic protocols set by physician.;Long term: Adoption of self-care skills and reduction of barriers for early signs and symptoms recognition and intervention leading to self-care maintenance.    Hypertension Yes    Intervention Provide education on lifestyle modifcations including regular physical activity/exercise, weight management, moderate sodium restriction and increased consumption of fresh fruit, vegetables, and low fat dairy, alcohol moderation, and smoking cessation.;Monitor prescription use compliance.    Expected Outcomes Short Term: Continued assessment and intervention until BP is < 140/22mm HG in hypertensive participants. < 130/53mm HG in hypertensive participants with diabetes, heart failure or chronic kidney disease.;Long Term: Maintenance of blood pressure at goal levels.    Lipids Yes    Intervention Provide education and support for participant on nutrition & aerobic/resistive exercise along with prescribed medications to achieve LDL 70mg , HDL >40mg .    Expected Outcomes Short Term: Participant states understanding of desired cholesterol values and is compliant with medications prescribed. Participant is following exercise prescription and nutrition guidelines.;Long Term: Cholesterol controlled with medications as prescribed, with individualized exercise RX and with personalized nutrition plan. Value goals: LDL < 70mg , HDL > 40 mg.             Education:Diabetes - Individual verbal and written instruction to review signs/symptoms of diabetes, desired ranges of glucose level fasting, after meals and with exercise. Acknowledge that pre and post exercise glucose checks will be done for 3 sessions at entry of program. [place] from 07/25/2022 in [facility] Cardiac  and Pulmonary Rehab  Date 07/18/22  Educator [facility]  Instruction Review Code 1- Verbalizes Understanding       Core Components/Risk Factors/Patient Goals Review:    Core Components/Risk Factors/Patient Goals at Discharge (Final Review):    ITP Comments:  ITP Comments     Row Name 07/18/22 1026 07/25/22 1424         ITP Comments Virtual Visit completed. Patient informed on EP and RD appointment and 6 Minute walk test. Patient also informed of patient health questionnaires on My Chart. Patient Verbalizes understanding. Visit diagnosis can be found in [facility] 07/07/2022. Completed 6MWT and gym orientation. Initial ITP created and sent for review to Dr. Karen Chafe covering for Dr. Emily Filbert, Medical Director.               Comments: Initial ITP

## 2022-07-25 NOTE — Patient Instructions (Signed)
Patient Instructions  Patient Details  Name: Jessica Wheeler MRN: [id number] Date of Birth: [birth year] Referring Provider:  Nelva Bush, MD  Below are your personal goals for exercise, nutrition, and risk factors. Our goal is to help you stay on track towards obtaining and maintaining these goals. We will be discussing your progress on these goals with you throughout the program.  Initial Exercise Prescription:  Initial Exercise Prescription - 07/25/22 1400       Date of Initial Exercise RX and Referring Provider   Date 07/25/22    Referring Provider End, Harrell Gave MD      Oxygen   Maintain Oxygen Saturation 88% or higher      Treadmill   MPH 1.6    Grade 0    Minutes 15    METs 2.23      REL-XR   Level 1    Speed 50    Minutes 15    METs 2      T5 Nustep   Level 1    SPM 80    Minutes 15    METs 2      Track   Laps 23    Minutes 15    METs 2.23      Prescription Details   Frequency (times per week) 3    Duration Progress to 30 minutes of continuous aerobic without signs/symptoms of physical distress      Intensity   THRR 40-80% of Max Heartrate 107-127    Ratings of Perceived Exertion 11-13    Perceived Dyspnea 0-4      Progression   Progression Continue to progress workloads to maintain intensity without signs/symptoms of physical distress.      Resistance Training   Training Prescription Yes    Weight 3 lb    Reps 10-15             Exercise Goals: Frequency: Be able to perform aerobic exercise two to three times per week in program working toward 2-5 days per week of home exercise.  Intensity: Work with a perceived exertion of 11 (fairly light) - 15 (hard) while following your exercise prescription.  We will make changes to your prescription with you as you progress through the program.   Duration: Be able to do 30 to 45 minutes of continuous aerobic exercise in addition to a 5 minute warm-up and a 5 minute cool-down routine.    Nutrition Goals: Your personal nutrition goals will be established when you do your nutrition analysis with the dietician.  The following are general nutrition guidelines to follow: Cholesterol < 200mg /day Sodium < 1500mg /day Fiber: Women over 50 yrs - 21 grams per day  Personal Goals:  Personal Goals and Risk Factors at Admission - 07/25/22 1439       Core Components/Risk Factors/Patient Goals on Admission    Weight Management Yes;Weight Maintenance    Intervention Weight Management: Develop a combined nutrition and exercise program designed to reach desired caloric intake, while maintaining appropriate intake of nutrient and fiber, sodium and fats, and appropriate energy expenditure required for the weight goal.;Weight Management: Provide education and appropriate resources to help participant work on and attain dietary goals.;Weight Management/Obesity: Establish reasonable short term and long term weight goals.    Admit Weight 134 lb 14.4 oz (61.2 kg)    Goal Weight: Short Term 134 lb (60.8 kg)    Goal Weight: Long Term 134 lb (60.8 kg)    Expected Outcomes Short Term: Continue to assess and  modify interventions until short term weight is achieved;Long Term: Adherence to nutrition and physical activity/exercise program aimed toward attainment of established weight goal;Weight Maintenance: Understanding of the daily nutrition guidelines, which includes 25-35% calories from fat, 7% or less cal from saturated fats, less than 200mg  cholesterol, less than 1.5gm of sodium, & 5 or more servings of fruits and vegetables daily    Diabetes Yes    Intervention Provide education about signs/symptoms and action to take for hypo/hyperglycemia.;Provide education about proper nutrition, including hydration, and aerobic/resistive exercise prescription along with prescribed medications to achieve blood glucose in normal ranges: Fasting glucose 65-99 mg/dL    Expected Outcomes Short Term: Participant  verbalizes understanding of the signs/symptoms and immediate care of hyper/hypoglycemia, proper foot care and importance of medication, aerobic/resistive exercise and nutrition plan for blood glucose control.;Long Term: Attainment of HbA1C < 7%.    Heart Failure Yes    Intervention Provide a combined exercise and nutrition program that is supplemented with education, support and counseling about heart failure. Directed toward relieving symptoms such as shortness of breath, decreased exercise tolerance, and extremity edema.    Expected Outcomes Improve functional capacity of life;Short term: Attendance in program 2-3 days a week with increased exercise capacity. Reported lower sodium intake. Reported increased fruit and vegetable intake. Reports medication compliance.;Short term: Daily weights obtained and reported for increase. Utilizing diuretic protocols set by physician.;Long term: Adoption of self-care skills and reduction of barriers for early signs and symptoms recognition and intervention leading to self-care maintenance.    Hypertension Yes    Intervention Provide education on lifestyle modifcations including regular physical activity/exercise, weight management, moderate sodium restriction and increased consumption of fresh fruit, vegetables, and low fat dairy, alcohol moderation, and smoking cessation.;Monitor prescription use compliance.    Expected Outcomes Short Term: Continued assessment and intervention until BP is < 140/52mm HG in hypertensive participants. < 130/41mm HG in hypertensive participants with diabetes, heart failure or chronic kidney disease.;Long Term: Maintenance of blood pressure at goal levels.    Lipids Yes    Intervention Provide education and support for participant on nutrition & aerobic/resistive exercise along with prescribed medications to achieve LDL 70mg , HDL >40mg .    Expected Outcomes Short Term: Participant states understanding of desired cholesterol values and  is compliant with medications prescribed. Participant is following exercise prescription and nutrition guidelines.;Long Term: Cholesterol controlled with medications as prescribed, with individualized exercise RX and with personalized nutrition plan. Value goals: LDL < 70mg , HDL > 40 mg.             Tobacco Use Initial Evaluation: Social History   Tobacco Use  Smoking Status Former   Packs/day: 1.00   Years: 15.00   Additional pack years: 0.00   Total pack years: 15.00   Types: Cigarettes   Quit date: 08/03/2003   Years since quitting: 18.9  Smokeless Tobacco Never    Exercise Goals and Review:  Exercise Goals     Row Name 07/25/22 1437             Exercise Goals   Increase Physical Activity Yes       Intervention Provide advice, education, support and counseling about physical activity/exercise needs.;Develop an individualized exercise prescription for aerobic and resistive training based on initial evaluation findings, risk stratification, comorbidities and participant's personal goals.       Expected Outcomes Short Term: Attend rehab on a regular basis to increase amount of physical activity.;Long Term: Add in home exercise to make exercise part  of routine and to increase amount of physical activity.;Long Term: Exercising regularly at least 3-5 days a week.       Increase Strength and Stamina Yes       Intervention Provide advice, education, support and counseling about physical activity/exercise needs.;Develop an individualized exercise prescription for aerobic and resistive training based on initial evaluation findings, risk stratification, comorbidities and participant's personal goals.       Expected Outcomes Short Term: Increase workloads from initial exercise prescription for resistance, speed, and METs.;Short Term: Perform resistance training exercises routinely during rehab and add in resistance training at home;Long Term: Improve cardiorespiratory fitness, muscular  endurance and strength as measured by increased METs and functional capacity (6MWT)       Able to understand and use rate of perceived exertion (RPE) scale Yes       Intervention Provide education and explanation on how to use RPE scale       Expected Outcomes Short Term: Able to use RPE daily in rehab to express subjective intensity level;Long Term:  Able to use RPE to guide intensity level when exercising independently       Able to understand and use Dyspnea scale Yes       Intervention Provide education and explanation on how to use Dyspnea scale       Expected Outcomes Short Term: Able to use Dyspnea scale daily in rehab to express subjective sense of shortness of breath during exertion;Long Term: Able to use Dyspnea scale to guide intensity level when exercising independently       Knowledge and understanding of Target Heart Rate Range (THRR) Yes       Intervention Provide education and explanation of THRR including how the numbers were predicted and where they are located for reference       Expected Outcomes Short Term: Able to state/look up THRR;Short Term: Able to use daily as guideline for intensity in rehab;Long Term: Able to use THRR to govern intensity when exercising independently       Able to check pulse independently Yes       Intervention Review the importance of being able to check your own pulse for safety during independent exercise;Provide education and demonstration on how to check pulse in carotid and radial arteries.       Expected Outcomes Short Term: Able to explain why pulse checking is important during independent exercise;Long Term: Able to check pulse independently and accurately       Understanding of Exercise Prescription Yes       Intervention Provide education, explanation, and written materials on patient's individual exercise prescription       Expected Outcomes Short Term: Able to explain program exercise prescription;Long Term: Able to explain home exercise  prescription to exercise independently              Copy of goals given to participant.

## 2022-07-27 ENCOUNTER — Other Ambulatory Visit: Payer: Self-pay

## 2022-07-27 ENCOUNTER — Encounter: Payer: Medicare HMO | Admitting: *Deleted

## 2022-07-27 DIAGNOSIS — Z955 Presence of coronary angioplasty implant and graft: Secondary | ICD-10-CM

## 2022-07-27 DIAGNOSIS — I252 Old myocardial infarction: Secondary | ICD-10-CM | POA: Diagnosis not present

## 2022-07-27 DIAGNOSIS — I214 Non-ST elevation (NSTEMI) myocardial infarction: Secondary | ICD-10-CM

## 2022-07-27 DIAGNOSIS — Z48812 Encounter for surgical aftercare following surgery on the circulatory system: Secondary | ICD-10-CM | POA: Diagnosis not present

## 2022-07-27 LAB — GLUCOSE, CAPILLARY
Glucose-Capillary: 156 mg/dL — ABNORMAL HIGH (ref 70–99)
Glucose-Capillary: 130 mg/dL — ABNORMAL HIGH (ref 70–99)

## 2022-07-27 NOTE — Progress Notes (Signed)
Daily Session Note  Patient Details  Name: Jessica Wheeler MRN: [id number] Date of Birth: [birth year] Referring Provider:   Flowsheet Row Cardiac Rehab from 07/25/2022 in [facility] Cardiac and Pulmonary Rehab  Referring Provider End, Harrell Gave MD       Encounter Date: 07/27/2022  Check In:  Session Check In - 07/27/22 1005       Check-In   Supervising physician immediately available to respond to emergencies See telemetry face sheet for immediately available ER MD    Location [facility]-Cardiac & Pulmonary Rehab    Staff Present Antionette Fairy, BS, Exercise Physiologist;Joseph [place], RCP,RRT,BSRT;Talan Gildner [place]asper RN, Odelia Gage, RN, [place]    Virtual Visit No    Medication changes reported     No    Fall or balance concerns reported    No    Tobacco Cessation No Change    Warm-up and Cool-down Performed on first and last piece of equipment    Resistance Training Performed Yes    VAD Patient? No    PAD/SET Patient? No      Pain Assessment   Currently in Pain? No/denies                Social History   Tobacco Use  Smoking Status Former   Packs/day: 1.00   Years: 15.00   Additional pack years: 0.00   Total pack years: 15.00   Types: Cigarettes   Quit date: 08/03/2003   Years since quitting: 18.9  Smokeless Tobacco Never    Goals Met:  Independence with exercise equipment Exercise tolerated well No report of concerns or symptoms today Strength training completed today  Goals Unmet:  Not Applicable  Comments: First full day of exercise!  Patient was oriented to gym and equipment including functions, settings, policies, and procedures.  Patient's individual exercise prescription and treatment plan were reviewed.  All starting workloads were established based on the results of the 6 minute walk test done at initial orientation visit.  The plan for exercise progression was also introduced and progression will be customized based on patient's performance and goals.    Dr.  Emily Filbert is Medical Director for [place].  Dr. Ottie Glazier is Medical Director for [facility] Pulmonary Rehabilitation.

## 2022-07-27 NOTE — Progress Notes (Signed)
I think that would help her. CMAs, can you put in a referral please? [id number])       Previous Messages    ----- Message ----- From: Neldon Labella, RN Sent: 07/15/2022  10:37 AM EDT To: Virginia Crews, MD Subject: Updated [id number]                          Good Morning Dr. B!  I received a message regarding Mrs. Buntrock's recent MI. She contacted the triage line regarding concerning symptoms.  Would you like to re-enroll her for CCM services? Our team will gladly assist if you think she would benefit. Thank You!

## 2022-07-28 DIAGNOSIS — R0789 Other chest pain: Secondary | ICD-10-CM | POA: Diagnosis not present

## 2022-07-28 DIAGNOSIS — I10 Essential (primary) hypertension: Secondary | ICD-10-CM | POA: Diagnosis not present

## 2022-07-29 ENCOUNTER — Telehealth: Payer: Self-pay | Admitting: Cardiovascular Disease

## 2022-07-29 ENCOUNTER — Ambulatory Visit: Payer: Medicare HMO | Attending: Nurse Practitioner | Admitting: Nurse Practitioner

## 2022-07-29 ENCOUNTER — Encounter: Payer: Self-pay | Admitting: Nurse Practitioner

## 2022-07-29 ENCOUNTER — Other Ambulatory Visit: Payer: Self-pay

## 2022-07-29 ENCOUNTER — Emergency Department
Admission: EM | Admit: 2022-07-29 | Discharge: 2022-07-29 | Disposition: A | Discharge status: Home / Self Care | Payer: Medicare HMO | Attending: Emergency Medicine | Admitting: Emergency Medicine

## 2022-07-29 ENCOUNTER — Emergency Department: Payer: Medicare HMO

## 2022-07-29 ENCOUNTER — Telehealth: Payer: Self-pay

## 2022-07-29 VITALS — BP 140/60 | HR 90 | Ht 63.0 in | Wt 135.8 lb

## 2022-07-29 DIAGNOSIS — R079 Chest pain, unspecified: Secondary | ICD-10-CM | POA: Diagnosis not present

## 2022-07-29 DIAGNOSIS — I25110 Atherosclerotic heart disease of native coronary artery with unstable angina pectoris: Secondary | ICD-10-CM | POA: Diagnosis not present

## 2022-07-29 DIAGNOSIS — I7102 Dissection of abdominal aorta: Secondary | ICD-10-CM | POA: Diagnosis not present

## 2022-07-29 DIAGNOSIS — E782 Mixed hyperlipidemia: Secondary | ICD-10-CM

## 2022-07-29 DIAGNOSIS — D649 Anemia, unspecified: Secondary | ICD-10-CM | POA: Diagnosis not present

## 2022-07-29 DIAGNOSIS — Z8673 Personal history of transient ischemic attack (TIA), and cerebral infarction without residual deficits: Secondary | ICD-10-CM | POA: Diagnosis not present

## 2022-07-29 DIAGNOSIS — E1159 Type 2 diabetes mellitus with other circulatory complications: Secondary | ICD-10-CM

## 2022-07-29 DIAGNOSIS — I214 Non-ST elevation (NSTEMI) myocardial infarction: Secondary | ICD-10-CM

## 2022-07-29 DIAGNOSIS — R072 Precordial pain: Secondary | ICD-10-CM | POA: Diagnosis present

## 2022-07-29 DIAGNOSIS — I200 Unstable angina: Secondary | ICD-10-CM

## 2022-07-29 DIAGNOSIS — I10 Essential (primary) hypertension: Secondary | ICD-10-CM | POA: Diagnosis not present

## 2022-07-29 DIAGNOSIS — R0789 Other chest pain: Secondary | ICD-10-CM | POA: Diagnosis not present

## 2022-07-29 DIAGNOSIS — K921 Melena: Secondary | ICD-10-CM | POA: Diagnosis not present

## 2022-07-29 DIAGNOSIS — I25118 Atherosclerotic heart disease of native coronary artery with other forms of angina pectoris: Secondary | ICD-10-CM

## 2022-07-29 LAB — CBC
HCT: 33.6 % — ABNORMAL LOW (ref 36.0–46.0)
Hemoglobin: 11.2 g/dL — ABNORMAL LOW (ref 12.0–15.0)
MCH: 30.4 pg (ref 26.0–34.0)
MCHC: 33.3 g/dL (ref 30.0–36.0)
MCV: 91.1 fL (ref 80.0–100.0)
Platelets: 324 10*3/uL (ref 150–400)
RBC: 3.69 MIL/uL — ABNORMAL LOW (ref 3.87–5.11)
RDW: 13.6 % (ref 11.5–15.5)
WBC: 5.3 10*3/uL (ref 4.0–10.5)
nRBC: 0 % (ref 0.0–0.2)

## 2022-07-29 LAB — TROPONIN I (HIGH SENSITIVITY)
Troponin I (High Sensitivity): 23 ng/L — ABNORMAL HIGH (ref <18)
Troponin I (High Sensitivity): 25 ng/L — ABNORMAL HIGH (ref <18)

## 2022-07-29 LAB — BASIC METABOLIC PANEL WITH GFR
Anion gap: 12 (ref 5–15)
BUN: 11 mg/dL (ref 8–23)
CO2: 24 mmol/L (ref 22–32)
Calcium: 9.5 mg/dL (ref 8.9–10.3)
Chloride: 96 mmol/L — ABNORMAL LOW (ref 98–111)
Creatinine, Ser: 0.81 mg/dL (ref 0.44–1.00)
GFR, Estimated: >60 mL/min
Glucose, Bld: 120 mg/dL — ABNORMAL HIGH (ref 70–99)
Potassium: 3.9 mmol/L (ref 3.5–5.1)
Sodium: 132 mmol/L — ABNORMAL LOW (ref 135–145)

## 2022-07-29 MED ORDER — NITROGLYCERIN 0.4 MG SL SUBL: 0.4000 mg | SUBLINGUAL_TABLET | SUBLINGUAL | 3 refills | Status: DC | PRN

## 2022-07-29 MED ORDER — NITROGLYCERIN 0.4 MG SL SUBL
0.4000 mg | SUBLINGUAL_TABLET | SUBLINGUAL | Status: DC | PRN
  Administered 2022-07-29: 0.4 mg via SUBLINGUAL

## 2022-07-29 NOTE — Telephone Encounter (Signed)
Spoke with patient and she reports having chest pressure yesterday and called EMS. They came out to assess her and did EKG. They told her to take 4 asa and rest. It lighted up a lot since yesterday morning. Pain from her  heart attack was across shoulders. If she had that she would have went to ED. Chest feels like it did when she was on Brilinta but she is no longer taking that medication. She did cardiac rehab on Wednesday and completed all 20 minute exercises and felt good. She was directed to call her doctor by EMS and cardiac rehab. She is concerned about this discomfort and does not have nitroglycerin. She is just very concerned with these feelings. Scheduled her to come in today to see our APP. She was appreciative for the call back and appointment.

## 2022-07-29 NOTE — Telephone Encounter (Signed)
   CCM RN Visit Note   07/29/22 Name: Jessica Wheeler MRN: [id number]      DOB: [birth year]  Subjective: Jessica Wheeler is a 82 y.o. year old female who is a primary care patient of Rene Kocher, [name], MD. The patient was referred to the Chronic Care Management team for assistance with care management needs subsequent to provider initiation of CCM services and plan of care.      An unsuccessful outreach attempt was made today.  PLAN: A HIPAA compliant phone message was left for the patient providing contact information and requesting a return call.   Horris Latino RN Care Manager/Chronic Care Management [phone]

## 2022-07-29 NOTE — Progress Notes (Addendum)
Office Visit    Patient Name: Jessica Wheeler Date of Encounter: 07/29/2022  Primary Care Provider:  Virginia Crews, MD Primary Cardiologist:  Ida Rogue, MD  Chief Complaint    82 y/o ? w/ a h/o CAD s/p recent NSTEMI and LAD stenting, ICM/HFrEF (EF 35-40%), hypertension, hyperlipidemia, diabetes, aortic dissection, renal artery stenosis, pancreatitis, and chronic hyponatremia, who presents for follow-up related to chest pain.  Past Medical History    Past Medical History:  Diagnosis Date   Allergy    Arthritis    Bundle branch block, right    C7 cervical fracture (HCC)    CAD (coronary artery disease)    a. 06/2005 Cath: LM nl, LAD 43m, LCX nl, RCA 20ost->med rx; b. 07/2022 NSTEMI/PCI: LM 40d, LAD 50p, 40p/m, 62m (2.0x19 Onyx Frontier DES), RI 20, LCX small, mild/diff dzs, RCA 60ost, 65p, 60d. EF 25-35%.   Chronic HFrEF (heart failure with reduced ejection fraction) ([place])    a. 10/2011 Echo: EF 65-70%; b. 07/2022 Echo: EF 35-40%, glob HK, basal wall motion best preserved. GrI DD, nl RV fxn, mild MR.   Diabetes mellitus    Diffuse cystic mastopathy    Dissection, aorta (HCC)    Dr.Dew follows   Diverticulosis    Family history of malignant neoplasm of gastrointestinal tract    Glaucoma 2005   Heart murmur    History of bronchitis    History of pancreatitis 2005   Hyperlipidemia LDL goal <70    Hypertension 1982   Ischemic cardiomyopathy    a. 07/2022 Echo: EF 35-40%.   Personal history of tobacco use, presenting hazards to health    RBBB    Ulcer    Past Surgical History:  Procedure Laterality Date   ABDOMINAL HYSTERECTOMY  1991   ANTERIOR CERVICAL CORPECTOMY N/A 03/30/2020   Procedure: CORPECTOMY CERVICAL FOUR - CERVICAL FIVE;  Surgeon: Newman Pies, MD;  Location: [place];  Service: Neurosurgery;  Laterality: N/A;  anterior   APPENDECTOMY     BACK SURGERY  1982   for lumbar ruptured disc. No hardware, no discectomy, no fusion   BLADDER SURGERY      BREAST BIOPSY Right 1980's   benign   BREAST EXCISIONAL BIOPSY Right 1980s   benign   BREAST SURGERY     biopsy   CARDIAC CATHETERIZATION     Dr. Ubaldo Glassing, few years ago   CATARACT EXTRACTION W/PHACO Left 08/31/2015   Procedure: CATARACT EXTRACTION PHACO AND INTRAOCULAR LENS PLACEMENT ([place]);  Surgeon: Ronnell Freshwater, MD;  Location: [place];  Service: Ophthalmology;  Laterality: Left;  DIABETIC - oral meds   CATARACT EXTRACTION W/PHACO Right 10/12/2015   Procedure: CATARACT EXTRACTION PHACO AND INTRAOCULAR LENS PLACEMENT ([place]) right eye;  Surgeon: Ronnell Freshwater, MD;  Location: [place];  Service: Ophthalmology;  Laterality: Right;  DIABETIC - oral meds   CHOLECYSTECTOMY  1994   COLONOSCOPY  2008,02/22/2012   Dr. Sankar-2013   COLONOSCOPY WITH PROPOFOL N/A 04/04/2017   Procedure: COLONOSCOPY WITH PROPOFOL;  Surgeon: Christene Lye, MD;  Location: [facility] ENDOSCOPY;  Service: Endoscopy;  Laterality: N/A;   CORONARY STENT INTERVENTION N/A 07/08/2022   Procedure: CORONARY STENT INTERVENTION;  Surgeon: Nelva Bush, MD;  Location: [place] CV LAB;  Service: Cardiovascular;  Laterality: N/A;   POSTERIOR CERVICAL FUSION/FORAMINOTOMY N/A 04/01/2020   Procedure: POSTERIOR CERVICAL FUSION/FORAMINOTOMY CERVICAL FOUR- CERVICAL SIX;  Surgeon: Newman Pies, MD;  Location: [place];  Service: Neurosurgery;  Laterality: N/A;  REFRACTIVE SURGERY     RIGHT/LEFT HEART CATH AND CORONARY ANGIOGRAPHY N/A 07/08/2022   Procedure: RIGHT/LEFT HEART CATH AND CORONARY ANGIOGRAPHY;  Surgeon: Nelva Bush, MD;  Location: [place] CV LAB;  Service: Cardiovascular;  Laterality: N/A;    Allergies  Allergies  Allergen Reactions   Beta Adrenergic Blockers     Chest pressure/difficulty breathing   Jardiance [Empagliflozin]     Muscle cramps   Levaquin [Levofloxacin In D5w]     Low blood sugar; does take this when she needs it. She just makes sure to control her  BS to avoid hypoglycemia.   Lisinopril Cough   Zetia [Ezetimibe]     Lethargic    Prevnar [Pneumococcal 13-Val Conj Vacc] Rash    Joint Pain   Sulfa Antibiotics Rash    "makes my skin raw"    History of Present Illness    82 year old female with above past medical history including CAD, hypertension, hyperlipidemia, diabetes, aortic dissection, renal artery stenosis, pancreatitis, and chronic hyponatremia.  In early March, she was admitted with chest pain and ruled in for non-STEMI.  EF was 35 to 40% by echo.  She underwent diagnostic catheterization revealing multivessel CAD with the culprit being severe mid LAD stenosis, which was successfully treated with a drug-eluting stent.  She had elevated right heart pressures and required diuresis.  She was subsequently discharged home on aspirin, statin, Brilinta, and ARB therapy (prior intolerance to beta-blockers).  2 days following hospital discharge, she noted dark stools with left lower quadrant pain.  She saw her primary care provider March 18 and was found to be anemic and was subsequently advised to present to the emergency department.  She also complained of melena.  She was placed on IV Protonix.  In the setting of GI bleed, Brilinta was discontinued in favor of Plavix.  She was volume overloaded requiring diuresis.  She did not require blood transfusion.  She was seen by GI with recommendation for outpatient follow-up and PPI therapy.  She had been doing well, walking regularly and attending cardiac rehab without symptoms or limitations, however, on 3/28, she was in her yard when she had sudden onset of severe substernal chest tightness associated with dyspnea.  She called EMS.  They came out to her home and performed an ECG which shows sinus rhythm with right bundle branch block and inferolateral T changes similar to prior ECG.  She says that she was given 4 baby aspirin with ongoing symptoms.  She was not advised to go to the ED and was not  offered nitroglycerin.  She was simply told to call them back if she continued to have symptoms later in the day.  Unfortunately, she continued to have substernal chest tightness and noted significant fatigue and dyspnea on exertion throughout the day.  She also had quite a restless night last night and has been up since about 2:40 in the morning due to ongoing discomfort.  Currently, she reports 2-3/10 chest tightness.  She did become dyspneic walking into the office today.  Symptoms are similar to prior angina.  She denies any recurrent melena or bright red blood per rectum.  Further, she denies palpitations, PND, orthopnea, dizziness, syncope, edema, or early satiety.  Home Medications    Current Outpatient Medications  Medication Sig Dispense Refill   Accu-Chek FastClix Lancets MISC TEST FASTING BLOOD SUGAR EVERY DAY 102 each 3   aspirin 81 MG EC tablet Take 81 mg by mouth daily.     atorvastatin (LIPITOR)  80 MG tablet Take 1 tablet (80 mg total) by mouth at bedtime. 90 tablet 0   clopidogrel (PLAVIX) 75 MG tablet Take 1 tablet (75 mg total) by mouth daily. 30 tablet 0   cyclobenzaprine (FLEXERIL) 5 MG tablet Take ONE tab PO BID/TID PRN     glucose blood (ACCU-CHEK GUIDE) test strip TEST FASTING BLOOD SUGAR EVERY DAY AS DIRECTED 100 strip 2   latanoprost (XALATAN) 0.005 % ophthalmic solution Place 1 drop into both eyes at bedtime.   3   losartan (COZAAR) 25 MG tablet Take 1 tablet (25 mg total) by mouth daily. 90 tablet 0   Magnesium 250 MG TABS Take 250 mg by mouth daily.     metFORMIN (GLUCOPHAGE) 500 MG tablet TAKE 2 TABLETS TWICE DAILY WITH MEALS 360 tablet 1   pantoprazole (PROTONIX) 40 MG tablet Take 1 tablet (40 mg total) by mouth daily. 30 tablet 0   vitamin B-12 (CYANOCOBALAMIN) 1000 MCG tablet Take 1,000 mcg by mouth daily.     Blood Glucose Monitoring Suppl (ACCU-CHEK GUIDE) w/Device KIT USE AS DIRECTED TO CHECK FASTING BLOOD SUGAR DAILY 1 kit 0   Lancets Misc. (ACCU-CHEK FASTCLIX  LANCET) KIT 1 each by Does not apply route as directed. 1 kit 1   SHINGRIX injection      Current Facility-Administered Medications  Medication Dose Route Frequency Provider Last Rate Last Admin   nitroGLYCERIN (NITROSTAT) SL tablet 0.4 mg  0.4 mg Sublingual Q5 min PRN Theora Gianotti, NP   0.4 mg at 07/29/22 1201     Review of Systems    Chest tightness and dyspnea as outlined above.  She denies melena,  Blood per rectum, palpitations, PND, orthopnea, dizziness, syncope, edema, or early satiety.  All other systems reviewed and are otherwise negative except as noted above.    Physical Exam    VS:  BP (!) 140/60   Pulse 90   Ht 5\' 3"  (1.6 m)   Wt 135 lb 12.8 oz (61.6 kg)   SpO2 98%   BMI 24.06 kg/m  , BMI Body mass index is 24.06 kg/m.     Vitals:   07/29/22 1113 07/29/22 1114  BP: (!) 158/60 (!) 140/60  Pulse: 90   SpO2: 98%     GEN: Well nourished, well developed, in no acute distress. HEENT: normal. Neck: Supple, no JVD, carotid bruits, or masses. Cardiac: RRR, 2/6 SEM throughout, no rubs or gallops. No clubbing, cyanosis, edema.  Radials 2+/PT 2+ and equal bilaterally.  Respiratory:  Respirations regular and unlabored, clear to auscultation bilaterally. GI: Soft, nontender, nondistended, BS + x 4. MS: no deformity or atrophy. Skin: warm and dry, no rash. Neuro:  Strength and sensation are intact. Psych: Normal affect.  Accessory Clinical Findings    ECG personally reviewed by me today - RSR, 90, RBBB, anterior infarct, inferolateral ST and T abnormalities- no acute changes.  Lab Results  Component Value Date   WBC 6.0 07/21/2022   HGB 10.3 (L) 07/21/2022   HCT 30.2 (L) 07/21/2022   MCV 91.2 07/21/2022   PLT 326 07/21/2022   Lab Results  Component Value Date   CREATININE 0.78 07/20/2022   BUN 15 07/20/2022   NA 134 (L) 07/20/2022   K 3.5 07/20/2022   CL 103 07/20/2022   CO2 22 07/20/2022   Lab Results  Component Value Date   ALT 19  07/20/2022   AST 24 07/20/2022   ALKPHOS 40 07/20/2022   BILITOT 1.0 07/20/2022  Lab Results  Component Value Date   CHOL 197 07/08/2022   HDL 103 07/08/2022   LDLCALC 82 07/08/2022   TRIG 62 07/08/2022   CHOLHDL 1.9 07/08/2022    Lab Results  Component Value Date   HGBA1C 7.1 (H) 07/07/2022    Assessment & Plan    1.  Non-STEMI, subsequent episode of care/unstable angina/coronary artery disease: Patient was admitted earlier this month with chest pain and dyspnea and ruled in for non-STEMI.  She was found to have LV dysfunction and subsequent cath showed severe mid LAD disease with otherwise moderate diffuse multivessel disease.  The LAD was successfully treated with drug-eluting stent.  She was readmitted a few days later with melena and mild anemia and her Brilinta was switched to Plavix.  She has been doing well, exercising regularly and going to cardiac rehab with chest pain or dyspnea however, she developed chest tightness and dyspnea on March 28, prompting EMS evaluation.  She continued to have chest pain throughout the day and into this morning.  Currently, she reports 2-3/10 chest tightness and was experiencing dyspnea when walking into the office today.  ECG shows persistent right bundle branch block with prior anterior infarct and inferolateral ST and T abnormalities.  No acute changes.  Given ongoing symptoms with recent MI and also anemia, I have recommended ED evaluation and she was wheeled over to the ED by our staff today.  She was given topical nitroglycerin in the office.  Pending lab evaluation, she likely require observation/admission, and potentially repeat diagnostic catheterization.  Continue aspirin, statin, Plavix, and ARB therapy.  2.  Chronic heart failure with reduced ejection fraction/ischemic cardiomyopathy: EF 35 to 40% with global hypokinesis by echo earlier this month.  Dyspnea on exertion in the setting of above.  She is euvolemic on examination.  Heart rate is  moderately elevated at 90 bpm.  She has previously tolerated beta-blockers.  She is currently on ARB therapy alone and has not required diuretic.  No changes to medications today, she is about to present to emergency department however, would have a low threshold to transition her from ARB to [facility] and potentially MRA in the future.  Previously intolerant to SGLT 2 inhibitor.  3.  Essential hypertension: Blood pressure elevated in clinic in the setting of ongoing chest pain.  Await ED evaluation.  Currently on losartan.  As above, would likely benefit from transitioning ARB to [facility] and potentially adding MRA.  4.  Hyperlipidemia: LDL of 82 earlier this month.  Now on high potency statin therapy and tolerating.  Plan for follow-up lipids in approximately 1 month.  5.  Type 2 diabetes mellitus: A1c 7.1 earlier this month.  She is on metformin.  Previously did not tolerate SGLT2 inhibitor.  6.  GI bleed/normocytic anemia: Recent admission for melena with plan for outpatient GI follow-up.  No recurrent melena or bright red blood per rectum.  On PPI.  Follow-up lab work in the ED.  7.  History of abdominal aortic dissection: Previously noted on CT imaging in 2010.  No dissection noted on ultrasound in 2013.  Previous followed by vascular surgery.  8.  Disposition: Patient to present to ED today for further evaluation and possible admission.  Follow-up in clinic in 2 weeks or sooner if necessary.   Murray Hodgkins, NP 07/29/2022, 12:12 PM

## 2022-07-29 NOTE — Telephone Encounter (Signed)
Pt c/o of Chest Pain: STAT if CP now or developed within 24 hours  1. Are you having CP right now?  Patient is currently has chest pressure around her left breast bone   2. Are you experiencing any other symptoms (ex. SOB, nausea, vomiting, sweating)?  Weakness, 135/65 87 this morning  3. How long have you been experiencing CP?  Pressure developed yesterday around 1:00 PM  4. Is your CP continuous or coming and going?  Continuous   5. Have you taken Nitroglycerin?  No, but she was advised to take 4 chewable baby aspirin yesterday  ?

## 2022-07-29 NOTE — ED Provider Notes (Signed)
[facility] Provider Note   Event Date/Time   First MD Initiated Contact with Patient 07/29/22 1233     (approximate) History  Chest Pain  HPI Jessica Wheeler is a 82 y.o. female with a stated past medical history of CVA with recent stent placement on 07/07/2022 who presents complaining of 2 days of intermittent crushing substernal 4/10 nonradiating chest pain that first occurred yesterday while she was walking her dog and resolved with aspirin and rest.  Patient then had another episode today that has not resolved and is still a "dull ache".  Patient does endorse worsening fatigue and dyspnea on exertion that has been present since this chest pain began. ROS: Patient currently denies any vision changes, tinnitus, difficulty speaking, facial droop, sore throat, abdominal pain, nausea/vomiting/diarrhea, dysuria, or weakness/numbness/paresthesias in any extremity   Physical Exam  Triage Vital Signs: ED Triage Vitals  Enc Vitals Group     BP 07/29/22 1213 (!) 173/78     Pulse Rate 07/29/22 1213 86     Resp 07/29/22 1213 18     Temp 07/29/22 1213 98 F (36.7 C)     Temp Source 07/29/22 1213 Oral     SpO2 07/29/22 1213 98 %     Weight 07/29/22 1215 135 lb 12.8 oz (61.6 kg)     Height 07/29/22 1215 5\' 3"  (1.6 m)     Head Circumference --      Peak Flow --      Pain Score 07/29/22 1215 4     Pain Loc --      Pain Edu? --      Excl. in [place]? --    Most recent vital signs: Vitals:   07/29/22 1213 07/29/22 1515  BP: (!) 173/78 (!) 158/70  Pulse: 86 84  Resp: 18 (!) 21  Temp: 98 F (36.7 C) 98.1 F (36.7 C)  SpO2: 98% 100%   General: Awake, oriented x4. CV:  Good peripheral perfusion.  Resp:  Normal effort.  Abd:  No distention.  Other:  Elderly overweight Caucasian female laying in bed in no acute distress ED Results / Procedures / Treatments  Labs (all labs ordered are listed, but only abnormal results are displayed) Labs Reviewed  BASIC METABOLIC PANEL  - Abnormal; Notable for the following components:      Result Value   Sodium 132 (*)    Chloride 96 (*)    Glucose, Bld 120 (*)    All other components within normal limits  CBC - Abnormal; Notable for the following components:   RBC 3.69 (*)    Hemoglobin 11.2 (*)    HCT 33.6 (*)    All other components within normal limits  TROPONIN I (HIGH SENSITIVITY) - Abnormal; Notable for the following components:   Troponin I (High Sensitivity) 23 (*)    All other components within normal limits  TROPONIN I (HIGH SENSITIVITY) - Abnormal; Notable for the following components:   Troponin I (High Sensitivity) 25 (*)    All other components within normal limits   EKG ED ECG REPORT I, Naaman Plummer, the attending physician, personally viewed and interpreted this ECG. Date: 07/29/2022 EKG Time: 1222 Rate: 83 Rhythm: normal sinus rhythm QRS Axis: normal Intervals: normal ST/T Wave abnormalities: normal Narrative Interpretation: no evidence of acute ischemia RADIOLOGY ED MD interpretation: 2 view chest x-ray interpreted by me shows no evidence of acute abnormalities including no pneumonia, pneumothorax, or widened mediastinum -Agree with radiology assessment Official radiology  report(s): DG Chest 2 View  Result Date: 07/29/2022 CLINICAL DATA:  Chest pain EXAM: CHEST - 2 VIEW COMPARISON:  07/19/2022 FINDINGS: Mild cardiomegaly. Both lungs are clear. Disc degenerative disease of the thoracic spine. IMPRESSION: Mild cardiomegaly. No acute abnormality of the lungs. Electronically Signed   By: Delanna Ahmadi M.D.   On: 07/29/2022 12:38   PROCEDURES: Critical Care performed: No .1-3 Lead EKG Interpretation  Performed by: Naaman Plummer, MD Authorized by: Naaman Plummer, MD     Interpretation: normal     ECG rate:  71   ECG rate assessment: normal     Rhythm: sinus rhythm     Ectopy: none     Conduction: normal    MEDICATIONS ORDERED IN ED: Medications - No data to display IMPRESSION /  MDM / [place] / ED COURSE  I reviewed the triage vital signs and the nursing notes.                             The patient is on the cardiac monitor to evaluate for evidence of arrhythmia and/or significant heart rate changes. Patient's presentation is most consistent with acute presentation with potential threat to life or bodily function. Workup: ECG, CXR, CBC, BMP, Troponin Findings: ECG: No overt evidence of STEMI. No evidence of Brugadas sign, delta wave, epsilon wave, significantly prolonged QTc, or malignant arrhythmia HS Troponin: Negative x1 Other Labs unremarkable for emergent problems. CXR: Without PTX, PNA, or widened mediastinum Last Stress Test:  2024 Last Heart Catheterization:  07/2022 HEART Score: 4  Given History, Exam, and Workup I have low suspicion for ACS, Pneumothorax, Pneumonia, Pulmonary Embolus, Tamponade, Aortic Dissection or other emergent problem as a cause for this presentation.   Reassesment: Prior to discharge patients pain was controlled and they were well appearing.  Disposition:  Discharge. Strict return precautions discussed with patient with full understanding. Advised patient to follow up promptly with primary care provider    FINAL CLINICAL IMPRESSION(S) / ED DIAGNOSES   Final diagnoses:  Chest pain, unspecified type   Rx / DC Orders   ED Discharge Orders          Ordered    nitroGLYCERIN (NITROSTAT) 0.4 MG SL tablet  Every 5 min PRN        07/29/22 1517    Ambulatory referral to Cardiology       Comments: If you have not heard from the Cardiology office within the next 72 hours please call [phone].   07/29/22 1518           Note:  This document was prepared using Dragon voice recognition software and may include unintentional dictation errors.   Naaman Plummer, MD 07/29/22 1535

## 2022-07-29 NOTE — Patient Instructions (Signed)
Medication Instructions:  Your physician recommends that you continue on your current medications as directed. Please refer to the Current Medication list given to you today.  *If you need a refill on your cardiac medications before your next appointment, please call your pharmacy*  Lab Work: No labs ordered  If you have labs (blood work) drawn today and your tests are completely normal, you will receive your results only by: [place] (if you have MyChart) OR A paper copy in the mail If you have any lab test that is abnormal or we need to change your treatment, we will call you to review the results.   Testing/Procedures: No testing ordered  Follow-Up: At [facility], you and your health needs are our priority.  As part of our continuing mission to provide you with exceptional heart care, we have created designated Provider Care Teams.  These Care Teams include your primary Cardiologist (physician) and Advanced Practice Providers (APPs -  Physician Assistants and Nurse Practitioners) who all work together to provide you with the care you need, when you need it.  We recommend signing up for the patient portal called "MyChart".  Sign up information is provided on this After Visit Summary.  MyChart is used to connect with patients for Virtual Visits (Telemedicine).  Patients are able to view lab/test results, encounter notes, upcoming appointments, etc.  Non-urgent messages can be sent to your provider as well.   To learn more about what you can do with MyChart, go to [email or web address].    Your next appointment:   2 week(s)  Provider:   You may see Ida Rogue, MD or Murray Hodgkins, NP

## 2022-07-29 NOTE — ED Triage Notes (Signed)
Pt to ED via POV from cardiologist office. Pt reports centralized chest tightness that started yesterday. Pt reports had cath 2wks ago and had 1 stent placed. Pt was started on Plavix after cath. Pt given 1 SL nitro at cardiology office. EKG done with no concerning findings. Pt reports chest tightness has eased off. Pt does endorse mild HA after nitro.

## 2022-08-01 ENCOUNTER — Encounter: Payer: Medicare HMO | Attending: Internal Medicine | Admitting: *Deleted

## 2022-08-01 ENCOUNTER — Encounter: Payer: Medicare HMO | Admitting: Family

## 2022-08-01 DIAGNOSIS — Z48812 Encounter for surgical aftercare following surgery on the circulatory system: Secondary | ICD-10-CM | POA: Diagnosis not present

## 2022-08-01 DIAGNOSIS — I214 Non-ST elevation (NSTEMI) myocardial infarction: Secondary | ICD-10-CM

## 2022-08-01 DIAGNOSIS — Z955 Presence of coronary angioplasty implant and graft: Secondary | ICD-10-CM | POA: Diagnosis not present

## 2022-08-01 DIAGNOSIS — I252 Old myocardial infarction: Secondary | ICD-10-CM | POA: Diagnosis not present

## 2022-08-01 LAB — GLUCOSE, CAPILLARY
Glucose-Capillary: 129 mg/dL — ABNORMAL HIGH (ref 70–99)
Glucose-Capillary: 125 mg/dL — ABNORMAL HIGH (ref 70–99)

## 2022-08-01 NOTE — Progress Notes (Signed)
Daily Session Note  Patient Details  Name: Jessica Wheeler MRN: [id number] Date of Birth: 11-Apr-1941 Referring Provider:   Flowsheet Row Cardiac Rehab from 07/25/2022 in [facility]  Referring Provider End, Harrell Gave MD       Encounter Date: 08/01/2022  Check In:  Session Check In - 08/01/22 1014       Check-In   Supervising physician immediately available to respond to emergencies See telemetry face sheet for immediately available ER MD    Location [facility]-Cardiac & Pulmonary Rehab    Staff Present Darlyne Russian, RN, Doyce Para, BS, ACSM CEP, Exercise Physiologist;Joseph Tessie Fass, [place]    Virtual Visit No    Medication changes reported     No    Fall or balance concerns reported    No    Warm-up and Cool-down Performed on first and last piece of equipment    Resistance Training Performed Yes    VAD Patient? No    PAD/SET Patient? No      Pain Assessment   Currently in Pain? No/denies                Social History   Tobacco Use  Smoking Status Former   Packs/day: 1.00   Years: 15.00   Additional pack years: 0.00   Total pack years: 15.00   Types: Cigarettes   Quit date: 08/03/2003   Years since quitting: 19.0  Smokeless Tobacco Never    Goals Met:  Independence with exercise equipment Exercise tolerated well No report of concerns or symptoms today Strength training completed today  Goals Unmet:  Not Applicable  Comments: Pt able to follow exercise prescription today without complaint.  Will continue to monitor for progression.    Dr. Emily Filbert is Medical Director for [facility].  Dr. Ottie Glazier is Medical Director for [facility] Pulmonary Rehabilitation.

## 2022-08-02 ENCOUNTER — Ambulatory Visit (INDEPENDENT_AMBULATORY_CARE_PROVIDER_SITE_OTHER): Payer: Medicare HMO | Admitting: Family Medicine

## 2022-08-02 ENCOUNTER — Encounter: Payer: Self-pay | Admitting: Family Medicine

## 2022-08-02 VITALS — BP 119/70 | HR 81 | Temp 98.0°F | Resp 20 | Wt 136.0 lb

## 2022-08-02 DIAGNOSIS — I5022 Chronic systolic (congestive) heart failure: Secondary | ICD-10-CM

## 2022-08-02 DIAGNOSIS — E1169 Type 2 diabetes mellitus with other specified complication: Secondary | ICD-10-CM

## 2022-08-02 DIAGNOSIS — E1159 Type 2 diabetes mellitus with other circulatory complications: Secondary | ICD-10-CM | POA: Diagnosis not present

## 2022-08-02 DIAGNOSIS — Z7984 Long term (current) use of oral hypoglycemic drugs: Secondary | ICD-10-CM

## 2022-08-02 DIAGNOSIS — D171 Benign lipomatous neoplasm of skin and subcutaneous tissue of trunk: Secondary | ICD-10-CM

## 2022-08-02 DIAGNOSIS — I152 Hypertension secondary to endocrine disorders: Secondary | ICD-10-CM | POA: Diagnosis not present

## 2022-08-02 DIAGNOSIS — D62 Acute posthemorrhagic anemia: Secondary | ICD-10-CM | POA: Diagnosis not present

## 2022-08-02 DIAGNOSIS — K921 Melena: Secondary | ICD-10-CM

## 2022-08-02 DIAGNOSIS — I25118 Atherosclerotic heart disease of native coronary artery with other forms of angina pectoris: Secondary | ICD-10-CM

## 2022-08-02 MED ORDER — CLOPIDOGREL BISULFATE 75 MG PO TABS: 75.0000 mg | ORAL_TABLET | Freq: Every day | ORAL | 1 refills | Status: DC

## 2022-08-02 MED ORDER — ATORVASTATIN CALCIUM 80 MG PO TABS: 80.0000 mg | ORAL_TABLET | Freq: Every day | ORAL | 1 refills | Status: DC

## 2022-08-02 MED ORDER — PANTOPRAZOLE SODIUM 40 MG PO TBEC: 40.0000 mg | DELAYED_RELEASE_TABLET | Freq: Two times a day (BID) | ORAL | 0 refills | Status: DC

## 2022-08-02 NOTE — Assessment & Plan Note (Signed)
Chronic and stable Fluid status is slightly hypovolemic at this time  Not on diuretic currently Ok to increase fluid intake slightly Continue to do daily weights and follow with cardiology Urine seems very concentrated - unable to give sample - return precautions discussed

## 2022-08-02 NOTE — Assessment & Plan Note (Signed)
2/2 GIB Recheck CBC at appt next week

## 2022-08-02 NOTE — Assessment & Plan Note (Signed)
Well controlled Continue current medications Reviewed metabolic panel 

## 2022-08-02 NOTE — Assessment & Plan Note (Signed)
Recent NSTEMI with DES placed in LAD Doing well with no CP currently Nitro prn Allergy to BB listed in chart Recent GI bleed, so now on Plavix instead of Brillinta Continue cardiac rehab Continue to f/u with Cardiology

## 2022-08-02 NOTE — Assessment & Plan Note (Signed)
Discussed Jardiance for her heart disease and DM Patinet did not tolerate low dose previously due to significant cramping Consider Farxiga in the future but cautiously

## 2022-08-02 NOTE — Progress Notes (Signed)
I,Sulibeya S Dimas,acting as a Education administrator for Lavon Paganini, MD.,have documented all relevant documentation on the behalf of Lavon Paganini, MD,as directed by  Lavon Paganini, MD while in the presence of Lavon Paganini, MD.     Established patient visit   Patient: DEDRICK Wheeler   DOB: 1940/09/05   82 y.o. Female  MRN: [id number] Visit Date: 08/02/2022  Today's healthcare provider: Lavon Paganini, MD   Chief Complaint  Patient presents with   Follow-up   Subjective    HPI  Follow up Hospitalization  Patient was seen in ER for chest pain on 07/29/22. She was treated for chest pain, unspecified. Treatment for this included take nitroglycerin PRN. She reports excellent compliance with treatment. She reports this condition is Improved. She report chest tightness and shortness of breath.   Was also hospitalized 07/19/22-07/21/22 for upper GI bleed and acute blood loss anemia Pt presented w/ likely GI bleed as pt had black stools x 3-4 days. Pt's home dose of brilinita was held, pt was continue on aspirin and pt was started on plavix. Pt did not have anymore black stools while inpatient and pt's H&H remained stable. GI evaluated the pt and no acute procedure was indicated as per GI. Pt was d/c home on po aspirin, plavix and pantoprazole. Brilinita was d/c as per cardio recs.   ----------------------------------------------------------------------------------------- She is C/O soreness on right lower abdomen area x a few weeks. She would like to go over lab results from hospital and ED. She reports strong odor to urine. She reports being on strict fluid intake. Urine seems very concentrated. Worse with eating spicy foods. Can feel a lump on the outside.  Medications: Outpatient Medications Prior to Visit  Medication Sig   Accu-Chek FastClix Lancets MISC TEST FASTING BLOOD SUGAR EVERY DAY   aspirin 81 MG EC tablet Take 81 mg by mouth daily.   Blood Glucose Monitoring Suppl  (ACCU-CHEK GUIDE) w/Device KIT USE AS DIRECTED TO CHECK FASTING BLOOD SUGAR DAILY   cyclobenzaprine (FLEXERIL) 5 MG tablet Take ONE tab PO BID/TID PRN   glucose blood (ACCU-CHEK GUIDE) test strip TEST FASTING BLOOD SUGAR EVERY DAY AS DIRECTED   Lancets Misc. (ACCU-CHEK FASTCLIX LANCET) KIT 1 each by Does not apply route as directed.   latanoprost (XALATAN) 0.005 % ophthalmic solution Place 1 drop into both eyes at bedtime.    losartan (COZAAR) 25 MG tablet Take 1 tablet (25 mg total) by mouth daily.   Magnesium 250 MG TABS Take 250 mg by mouth daily.   metFORMIN (GLUCOPHAGE) 500 MG tablet TAKE 2 TABLETS TWICE DAILY WITH MEALS   nitroGLYCERIN (NITROSTAT) 0.4 MG SL tablet Place 1 tablet (0.4 mg total) under the tongue every 5 (five) minutes as needed for chest pain.   SHINGRIX injection    vitamin B-12 (CYANOCOBALAMIN) 1000 MCG tablet Take 1,000 mcg by mouth daily.   [DISCONTINUED] atorvastatin (LIPITOR) 80 MG tablet Take 1 tablet (80 mg total) by mouth at bedtime.   [DISCONTINUED] clopidogrel (PLAVIX) 75 MG tablet Take 1 tablet (75 mg total) by mouth daily.   [DISCONTINUED] pantoprazole (PROTONIX) 40 MG tablet Take 1 tablet (40 mg total) by mouth daily.   No facility-administered medications prior to visit.    Review of Systems  Constitutional:  Negative for chills and fever.  Respiratory:  Positive for chest tightness and shortness of breath.   Cardiovascular:  Positive for leg swelling. Negative for chest pain.  Gastrointestinal:  Positive for abdominal pain. Negative for blood in stool,  constipation, diarrhea, nausea and vomiting.  Genitourinary:  Negative for dysuria and hematuria.       Objective    BP 119/70 (BP Location: Left Arm, Patient Position: Sitting, Cuff Size: Large)   Pulse 81   Temp 98 F (36.7 C) (Temporal)   Resp 20   Wt 136 lb (61.7 kg)   SpO2 99%   BMI 24.09 kg/m  BP Readings from Last 3 Encounters:  08/02/22 119/70  07/29/22 (!) 158/70  07/29/22 (!)  140/60   Wt Readings from Last 3 Encounters:  08/02/22 136 lb (61.7 kg)  07/29/22 135 lb 12.8 oz (61.6 kg)  07/29/22 135 lb 12.8 oz (61.6 kg)      Physical Exam Vitals reviewed.  Constitutional:      General: She is not in acute distress.    Appearance: Normal appearance. She is well-developed. She is not diaphoretic.  HENT:     Head: Normocephalic and atraumatic.  Eyes:     General: No scleral icterus.    Conjunctiva/sclera: Conjunctivae normal.  Neck:     Thyroid: No thyromegaly.  Cardiovascular:     Rate and Rhythm: Normal rate and regular rhythm.     Heart sounds: Normal heart sounds. No murmur heard. Pulmonary:     Effort: Pulmonary effort is normal. No respiratory distress.     Breath sounds: Normal breath sounds. No wheezing, rhonchi or rales.  Abdominal:     General: There is no distension.     Palpations: Abdomen is soft.     Tenderness: There is no abdominal tenderness. There is no guarding or rebound.     Comments: Small pea-sized mobile, non-tender, subcutaneous lumps along R abd wall  Musculoskeletal:     Cervical back: Neck supple.     Right lower leg: No edema.     Left lower leg: No edema.  Lymphadenopathy:     Cervical: No cervical adenopathy.  Skin:    General: Skin is warm and dry.     Findings: No rash.  Neurological:     Mental Status: She is alert and oriented to person, place, and time. Mental status is at baseline.  Psychiatric:        Mood and Affect: Mood normal.        Behavior: Behavior normal.       No results found for any visits on 08/02/22.  Assessment & Plan     Problem List Items Addressed This Visit       Cardiovascular and Mediastinum   Hypertension associated with diabetes    Well controlled Continue current medications Reviewed metabolic panel      Relevant Medications   atorvastatin (LIPITOR) 80 MG tablet   CAD (coronary artery disease)    Recent NSTEMI with DES placed in LAD Doing well with no CP  currently Nitro prn Allergy to BB listed in chart Recent GI bleed, so now on Plavix instead of Brillinta Continue cardiac rehab Continue to f/u with Cardiology      Relevant Medications   atorvastatin (LIPITOR) 80 MG tablet   Chronic systolic CHF (congestive heart failure)    Chronic and stable Fluid status is slightly hypovolemic at this time  Not on diuretic currently Ok to increase fluid intake slightly Continue to do daily weights and follow with cardiology Urine seems very concentrated - unable to give sample - return precautions discussed      Relevant Medications   atorvastatin (LIPITOR) 80 MG tablet     Digestive  GIB (gastrointestinal bleeding) - Primary    Recent upper GIB Still having some pain, but no more dark stools or blood in stool Increase PPI to BID If not improving, will set f/u with GI Return precautions discussed        Endocrine   DM (diabetes mellitus)    Discussed Jardiance for her heart disease and DM Patinet did not tolerate low dose previously due to significant cramping Consider Farxiga in the future but cautiously      Relevant Medications   atorvastatin (LIPITOR) 80 MG tablet     Other   Acute blood loss anemia    2/2 GIB Recheck CBC at appt next week      Lipoma of torso    Small and benign No intervention at this time        Return for as scheduled.      I, Lavon Paganini, MD, have reviewed all documentation for this visit. The documentation on 08/02/22 for the exam, diagnosis, procedures, and orders are all accurate and complete.   Jeramyah Goodpasture, [name], MD, MPH [facility]

## 2022-08-02 NOTE — Assessment & Plan Note (Signed)
Small and benign No intervention at this time

## 2022-08-02 NOTE — Assessment & Plan Note (Signed)
Recent upper GIB Still having some pain, but no more dark stools or blood in stool Increase PPI to BID If not improving, will set f/u with GI Return precautions discussed

## 2022-08-03 ENCOUNTER — Telehealth: Payer: Self-pay

## 2022-08-03 ENCOUNTER — Encounter: Payer: Medicare HMO | Admitting: *Deleted

## 2022-08-03 DIAGNOSIS — I214 Non-ST elevation (NSTEMI) myocardial infarction: Secondary | ICD-10-CM

## 2022-08-03 DIAGNOSIS — Z955 Presence of coronary angioplasty implant and graft: Secondary | ICD-10-CM | POA: Diagnosis not present

## 2022-08-03 DIAGNOSIS — I252 Old myocardial infarction: Secondary | ICD-10-CM | POA: Diagnosis not present

## 2022-08-03 DIAGNOSIS — Z48812 Encounter for surgical aftercare following surgery on the circulatory system: Secondary | ICD-10-CM | POA: Diagnosis not present

## 2022-08-03 LAB — GLUCOSE, CAPILLARY: Glucose-Capillary: 133 mg/dL — ABNORMAL HIGH (ref 70–99)

## 2022-08-03 NOTE — Telephone Encounter (Signed)
        Patient  visited [facility] on 3/29   Telephone encounter attempt :   1st A HIPAA compliant voice message was left requesting a return call.  Instructed patient to call back .   [facility] [phone] [address], [place], [place] [address] Phone: [phone] Email: [email or web address]

## 2022-08-03 NOTE — Progress Notes (Signed)
Daily Session Note  Patient Details  Name: Jessica Wheeler MRN: [id number] Date of Birth: [birth year] Referring Provider:   Flowsheet Row Cardiac Rehab from 07/25/2022 in [facility] Cardiac and Pulmonary Rehab  Referring Provider End, Harrell Gave MD       Encounter Date: 08/03/2022  Check In:  Session Check In - 08/03/22 1008       Check-In   Supervising physician immediately available to respond to emergencies See telemetry face sheet for immediately available ER MD    Location [facility]-Cardiac & Pulmonary Rehab    Staff Present Darlyne Russian, RN, ADN;Joseph Tessie Fass, RCP,RRT,BSRT;Noah Tickle, BS, Exercise Physiologist;Melissa Caiola MS, RDN, LDN    Virtual Visit No    Medication changes reported     No    Fall or balance concerns reported    No    Warm-up and Cool-down Performed on first and last piece of equipment    Resistance Training Performed Yes    VAD Patient? No    PAD/SET Patient? No      Pain Assessment   Currently in Pain? No/denies                Social History   Tobacco Use  Smoking Status Former   Packs/day: 1.00   Years: 15.00   Additional pack years: 0.00   Total pack years: 15.00   Types: Cigarettes   Quit date: 08/03/2003   Years since quitting: 19.0  Smokeless Tobacco Never    Goals Met:  Independence with exercise equipment Exercise tolerated well No report of concerns or symptoms today Strength training completed today  Goals Unmet:  Not Applicable  Comments: Pt able to follow exercise prescription today without complaint.  Will continue to monitor for progression.    Dr. Emily Filbert is Medical Director for [facility].  Dr. Ottie Glazier is Medical Director for [facility] Pulmonary Rehabilitation.

## 2022-08-04 ENCOUNTER — Telehealth: Payer: Self-pay

## 2022-08-04 NOTE — Telephone Encounter (Signed)
     Patient  visit on 3/29  at [facility]   Have you been able to follow up with your primary care physician? Yes   The patient was or was not able to obtain any needed medicine or equipment. Yes   Are there diet recommendations that you are having difficulty following? Na   Patient expresses understanding of discharge instructions and education provided has no other needs at this time.  Yes      [facility] [phone], [place],  [address] Phone: [phone] Email: [email or web address]

## 2022-08-05 DIAGNOSIS — Z48812 Encounter for surgical aftercare following surgery on the circulatory system: Secondary | ICD-10-CM | POA: Diagnosis not present

## 2022-08-05 DIAGNOSIS — I214 Non-ST elevation (NSTEMI) myocardial infarction: Secondary | ICD-10-CM

## 2022-08-05 DIAGNOSIS — Z955 Presence of coronary angioplasty implant and graft: Secondary | ICD-10-CM | POA: Diagnosis not present

## 2022-08-05 DIAGNOSIS — I252 Old myocardial infarction: Secondary | ICD-10-CM | POA: Diagnosis not present

## 2022-08-05 NOTE — Progress Notes (Signed)
Daily Session Note  Patient Details  Name: HARMONE PORCELLI MRN: [id number] Date of Birth: [birth year] Referring Provider:   Flowsheet Row Cardiac Rehab from 07/25/2022 in [facility] Cardiac and Pulmonary Rehab  Referring Provider End, Cristal Deer MD       Encounter Date: 08/05/2022  Check In:  Session Check In - 08/05/22 0945       Check-In   Supervising physician immediately available to respond to emergencies See telemetry face sheet for immediately available ER MD    Location [facility]-Cardiac & Pulmonary Rehab    Staff Present Darcel Bayley, RN,BC,MSN;Joseph [place], RCP,RRT,BSRT;Noah [place], [place], Exercise Physiologist    Virtual Visit No    Medication changes reported     No    Fall or balance concerns reported    No    Tobacco Cessation No Change    Warm-up and Cool-down Performed on first and last piece of equipment    Resistance Training Performed Yes    VAD Patient? No    PAD/SET Patient? No      Pain Assessment   Currently in Pain? No/denies    Multiple Pain Sites No                Social History   Tobacco Use  Smoking Status Former   Packs/day: 1.00   Years: 15.00   Additional pack years: 0.00   Total pack years: 15.00   Types: Cigarettes   Quit date: 08/03/2003   Years since quitting: 19.0  Smokeless Tobacco Never    Goals Met:  Independence with exercise equipment Exercise tolerated well No report of concerns or symptoms today  Goals Unmet:  Not Applicable  Comments: Pt able to follow exercise prescription today without complaint.  Will continue to monitor for progression.    Dr. Bethann Punches is Medical Director for [facility] Cardiac Rehabilitation.  Dr. Vida Rigger is Medical Director for [facility] Pulmonary Rehabilitation.

## 2022-08-08 ENCOUNTER — Encounter: Payer: Medicare HMO | Admitting: *Deleted

## 2022-08-08 DIAGNOSIS — Z955 Presence of coronary angioplasty implant and graft: Secondary | ICD-10-CM

## 2022-08-08 DIAGNOSIS — Z48812 Encounter for surgical aftercare following surgery on the circulatory system: Secondary | ICD-10-CM | POA: Diagnosis not present

## 2022-08-08 DIAGNOSIS — I252 Old myocardial infarction: Secondary | ICD-10-CM | POA: Diagnosis not present

## 2022-08-08 DIAGNOSIS — I214 Non-ST elevation (NSTEMI) myocardial infarction: Secondary | ICD-10-CM

## 2022-08-08 NOTE — Progress Notes (Signed)
Daily Session Note  Patient Details  Name: Jessica Wheeler MRN: [id number] Date of Birth: 01-Apr-1941 Referring Provider:   Flowsheet Row Cardiac Rehab from [facility]/25/2022 in [facility] Cardiac [facility] Pulmonary Rehab  Referring Provider End, Cristal Deer MD       Encounter Date: 08/08/2022  Check In:  Session Check In - 08/08/22 0955       Check-In   Supervising physician immediately available to respond to emergencies See telemetry face sheet for immediately available ER MD    Location [facility]-Cardiac & Pulmonary Rehab    Staff Present Lanny Hurst, RN, Franki Monte, BS, ACSM CEP, Exercise Physiologist;Noah Tickle, BS, Exercise Physiologist    Virtual Visit No    Medication changes reported     No    Fall or balance concerns reported    No    Warm-up and Cool-down Performed on first and last piece of equipment    Resistance Training Performed Yes    VAD Patient? No    PAD/SET Patient? No      Pain Assessment   Currently in Pain? No/denies                Social History   Tobacco Use  Smoking Status Former   Packs/day: 1.00   Years: 15.00   Additional pack years: 0.00   Total pack years: 15.00   Types: Cigarettes   Quit date: 08/03/2003   Years since quitting: 19.0  Smokeless Tobacco Never    Goals Met:  Independence with exercise equipment Exercise tolerated well No report of concerns or symptoms today Strength training completed today  Goals Unmet:  Not Applicable  Comments: Pt able to follow exercise prescription today without complaint.  Will continue to monitor for progression.    Dr. Bethann Punches is Medical Director for [facility] Cardiac Rehabilitation.  Dr. Vida Rigger is Medical Director for [facility] Pulmonary Rehabilitation.

## 2022-08-08 NOTE — Progress Notes (Unsigned)
I,Lezlee Gills S Adonai Selsor,acting as a Neurosurgeonscribe for Jessica LatchAngela Bacigalupo, MD.,have documented all relevant documentation on the behalf of Jessica LatchAngela Bacigalupo, MD,as directed by  Jessica LatchAngela Bacigalupo, MD while in the presence of Jessica LatchAngela Bacigalupo, MD.    Annual Wellness Visit     Patient: Jessica Wheeler, Female    DOB: January 10, 1941, 82 y.o.   MRN: [id number] Visit Date: 08/09/2022  Today's Provider: Shirlee LatchAngela Bacigalupo, MD   Chief Complaint  Patient presents with   Medicare Wellness   Subjective    Jessica Wheeler is a 82 y.o. female who presents today for her Annual Wellness Visit. She reports consuming a low fat, low sodium, and diabetic  diet. Gym/ health club routine includes cardio, treadmill, and walking on track . She generally feels well. She reports sleeping well. She does not have additional problems to discuss today.   HPI  Patient was started on furosemide 20 mg daily and potassium chloride 20 meq daily. She reports doing a virtual visit with a cardiologist on Saturday due to abnormal weight gain of 12 pounds. She reports after starting medication she feels so much better. Was able to walk and exercise and feels having good energy. SOB resolved.   Medications: Outpatient Medications Prior to Visit  Medication Sig   Accu-Chek FastClix Lancets MISC TEST FASTING BLOOD SUGAR EVERY DAY   aspirin 81 MG EC tablet Take 81 mg by mouth daily.   atorvastatin (LIPITOR) 80 MG tablet Take 1 tablet (80 mg total) by mouth at bedtime.   Blood Glucose Monitoring Suppl (ACCU-CHEK GUIDE) w/Device KIT USE AS DIRECTED TO CHECK FASTING BLOOD SUGAR DAILY   clopidogrel (PLAVIX) 75 MG tablet Take 1 tablet (75 mg total) by mouth daily.   cyclobenzaprine (FLEXERIL) 5 MG tablet Take ONE tab PO BID/TID PRN   furosemide (LASIX) 20 MG tablet Take 20 mg by mouth daily.   glucose blood (ACCU-CHEK GUIDE) test strip TEST FASTING BLOOD SUGAR EVERY DAY AS DIRECTED   Lancets Misc. (ACCU-CHEK FASTCLIX LANCET) KIT 1 each by  Does not apply route as directed.   latanoprost (XALATAN) 0.005 % ophthalmic solution Place 1 drop into both eyes at bedtime.    losartan (COZAAR) 25 MG tablet Take 1 tablet (25 mg total) by mouth daily.   Magnesium 250 MG TABS Take 250 mg by mouth daily.   metFORMIN (GLUCOPHAGE) 500 MG tablet TAKE 2 TABLETS TWICE DAILY WITH MEALS   nitroGLYCERIN (NITROSTAT) 0.4 MG SL tablet Place 1 tablet (0.4 mg total) under the tongue every 5 (five) minutes as needed for chest pain.   pantoprazole (PROTONIX) 40 MG tablet Take 1 tablet (40 mg total) by mouth 2 (two) times daily before a meal.   potassium chloride SA (KLOR-CON M) 20 MEQ tablet Take 20 mEq by mouth daily.   SHINGRIX injection    vitamin B-12 (CYANOCOBALAMIN) 1000 MCG tablet Take 1,000 mcg by mouth daily.   No facility-administered medications prior to visit.    Allergies  Allergen Reactions   Beta Adrenergic Blockers     Chest pressure/difficulty breathing   Jardiance [Empagliflozin]     Muscle cramps   Levaquin [Levofloxacin In D5w]     Low blood sugar; does take this when she needs it. She just makes sure to control her BS to avoid hypoglycemia.   Lisinopril Cough   Zetia [Ezetimibe]     Lethargic    Prevnar [Pneumococcal 13-Val Conj Vacc] Rash    Joint Pain   Sulfa Antibiotics Rash    "makes  my skin raw"    Patient Care Team: Wheeler, Marzella Schlein, MD as PCP - General (Family Medicine) Mariah Milling Tollie Pizza, MD as PCP - Cardiology (Cardiology) Antonieta Iba, MD as Consulting Physician (Cardiology) Candelaria Stagers, DPM as Consulting Physician (Podiatry) Domingo Madeira, [place] (Optometry) Jene Every, MD as Consulting Physician (Orthopedic Surgery) Tressie Stalker, MD as Consulting Physician (Neurosurgery) Geanie Logan, MD as Referring Physician (Otolaryngology)  Review of Systems  Constitutional:  Positive for unexpected weight change.  All other systems reviewed and are negative.   Last CBC Lab Results  Component Value  Date   WBC 5.3 07/29/2022   HGB 11.2 (L) 07/29/2022   HCT 33.6 (L) 07/29/2022   MCV 91.1 07/29/2022   MCH 30.4 07/29/2022   RDW 13.6 07/29/2022   PLT 324 07/29/2022   Last metabolic panel Lab Results  Component Value Date   GLUCOSE 120 (H) 07/29/2022   NA 132 (L) 07/29/2022   K 3.9 07/29/2022   CL 96 (L) 07/29/2022   CO2 24 07/29/2022   BUN 11 07/29/2022   CREATININE 0.81 07/29/2022   GFRNONAA >60 07/29/2022   CALCIUM 9.5 07/29/2022   PHOS 2.8 07/10/2022   PROT 7.3 07/20/2022   ALBUMIN 4.1 07/20/2022   LABGLOB 2.4 01/20/2022   AGRATIO 1.9 01/20/2022   BILITOT 1.0 07/20/2022   ALKPHOS 40 07/20/2022   AST 24 07/20/2022   ALT 19 07/20/2022   ANIONGAP 12 07/29/2022   Last lipids Lab Results  Component Value Date   CHOL 197 07/08/2022   HDL 103 07/08/2022   LDLCALC 82 07/08/2022   TRIG 62 07/08/2022   CHOLHDL 1.9 07/08/2022   Last hemoglobin A1c Lab Results  Component Value Date   HGBA1C 7.1 (H) 07/07/2022   Last thyroid functions Lab Results  Component Value Date   TSH 4.330 10/22/2021   Last vitamin D Lab Results  Component Value Date   VD25OH 29.2 (L) 12/11/2017   Last vitamin B12 and Folate Lab Results  Component Value Date   VITAMINB12 1,190 12/11/2017        Objective    Vitals: BP 130/69 (BP Location: Left Arm, Patient Position: Sitting, Cuff Size: Normal)   Pulse 78   Temp 97.8 F (36.6 C) (Temporal)   Resp 16   Ht 5\' 3"  (1.6 m)   Wt 131 lb 14.4 oz (59.8 kg)   SpO2 97%   BMI 23.37 kg/m  BP Readings from Last 3 Encounters:  08/09/22 130/69  08/02/22 119/70  07/29/22 (!) 158/70   Wt Readings from Last 3 Encounters:  08/09/22 131 lb 14.4 oz (59.8 kg)  08/02/22 136 lb (61.7 kg)  07/29/22 135 lb 12.8 oz (61.6 kg)     Physical Exam Vitals reviewed.  Constitutional:      General: She is not in acute distress.    Appearance: Normal appearance. She is well-developed. She is not diaphoretic.  HENT:     Head: Normocephalic and  atraumatic.     Right Ear: Tympanic membrane, ear canal and external ear normal.     Left Ear: Tympanic membrane, ear canal and external ear normal.     Nose: Nose normal.     Mouth/Throat:     Mouth: Mucous membranes are moist.     Pharynx: Oropharynx is clear. No oropharyngeal exudate.  Eyes:     General: No scleral icterus.    Conjunctiva/sclera: Conjunctivae normal.     Pupils: Pupils are equal, round, and reactive to light.  Neck:  Thyroid: No thyromegaly.  Cardiovascular:     Rate and Rhythm: Normal rate and regular rhythm.     Heart sounds: Murmur heard.  Pulmonary:     Effort: Pulmonary effort is normal. No respiratory distress.     Breath sounds: Normal breath sounds. No wheezing or rales.  Abdominal:     General: There is no distension.     Palpations: Abdomen is soft.     Tenderness: There is no abdominal tenderness.  Musculoskeletal:        General: No deformity.     Cervical back: Neck supple.     Right lower leg: No edema.     Left lower leg: No edema.  Lymphadenopathy:     Cervical: No cervical adenopathy.  Skin:    General: Skin is warm and dry.     Findings: No rash.  Neurological:     Mental Status: She is alert and oriented to person, place, and time. Mental status is at baseline.     Gait: Gait normal.  Psychiatric:        Mood and Affect: Mood normal.        Behavior: Behavior normal.        Thought Content: Thought content normal.      Diabetic Foot Exam - Simple   Simple Foot Form Diabetic Foot exam was performed with the following findings: Yes 08/09/2022  9:21 AM  Visual Inspection No deformities, no ulcerations, no other skin breakdown bilaterally: Yes Sensation Testing Intact to touch and monofilament testing bilaterally: Yes Pulse Check Posterior Tibialis and Dorsalis pulse intact bilaterally: Yes Comments      Most recent functional status assessment:    08/09/2022    8:56 AM  In your present state of health, do you have any  difficulty performing the following activities:  Hearing? 1  Vision? 0  Difficulty concentrating or making decisions? 0  Walking or climbing stairs? 1  Dressing or bathing? 0  Doing errands, shopping? 1   Most recent fall risk assessment:    08/09/2022    8:55 AM  Fall Risk   Falls in the past year? 0  Number falls in past yr: 0  Injury with Fall? 0  Risk for fall due to : No Fall Risks  Follow up Falls evaluation completed    Most recent depression screenings:    08/09/2022    8:55 AM 08/02/2022    2:52 PM  PHQ 2/9 Scores  PHQ - 2 Score 0 1  PHQ- 9 Score 6 5   Most recent cognitive screening:    07/04/2019   10:14 AM  6CIT Screen  What Year? 0 points  What month? 0 points  What time? 0 points  Count back from 20 0 points  Months in reverse 0 points  Repeat phrase 4 points  Total Score 4 points   Most recent Audit-C alcohol use screening    08/02/2022    2:52 PM  Alcohol Use Disorder Test (AUDIT)  1. How often do you have a drink containing alcohol? 0  2. How many drinks containing alcohol do you have on a typical day when you are drinking? 0  3. How often do you have six or more drinks on one occasion? 0  AUDIT-C Score 0   A score of 3 or more in women, and 4 or more in men indicates increased risk for alcohol abuse, EXCEPT if all of the points are from question 1   No results found  for any visits on 08/09/22.  Assessment & Plan     Annual wellness visit done today including the all of the following: Reviewed patient's Family Medical History Reviewed and updated list of patient's medical providers Assessment of cognitive impairment was done Assessed patient's functional ability Established a written schedule for health screening services Health Risk Assessent Completed and Reviewed  Exercise Activities and Dietary recommendations  Goals      DIET - EAT MORE FRUITS AND VEGETABLES     DIET - INCREASE WATER INTAKE     Recommend increasing water intake to 6-8  8 oz glasses a day.      RNCM: Patient concerned about weight loss after HA     Current Barriers:  Care Coordination needs related to incoming call from the patient asking for help about her questions after 4 pound weight loss in a patient with HF and recent MI with stent placement Chronic Disease Management support and education needs related to effective management of HF and post MI  Planned Interventions: Basic overview and discussion of pathophysiology of Heart Failure reviewed. The patient is concerned with weight loss, hacking cough, and shortness of breath. The patient had lost 4 pounds weight loss and was concerned. Reviewed with the patient what worse looks like and to make sure she is staying hydrated. She states that she was just going by her discharge instructions and wanted to make sure she is okay. The patient states she sees the cardiologist in April and will follow up with the care coordination nurse on 07-22-2022. Denies any acute findings this am.  Provided education on low sodium diet Reviewed Heart Failure Action Plan in depth and provided written copy Assessed need for readable accurate scales in home Provided education about placing scale on hard, flat surface Advised patient to weigh each morning after emptying bladder Discussed importance of daily weight and advised patient to weigh and record daily Reviewed role of diuretics in prevention of fluid overload and management of heart failure Discussed the importance of keeping all appointments with provider Provided patient with education about the role of exercise in the management of heart failure Advised patient to discuss changes in her heart health and when to call for changes in her weight or other sx and sx with provider Screening for signs and symptoms of depression related to chronic disease state  Assessed social determinant of health barriers  Symptom Management: Take medications as prescribed   Attend all  scheduled provider appointments Call provider office for new concerns or questions  call the Suicide and Crisis Lifeline: 988 call the [place] National Suicide Prevention Lifeline: (806) 355-3685 or TTY: (404)651-0326 TTY 4758801363) to talk to a trained counselor call 1-800-273-TALK (toll free, 24 hour hotline) if experiencing a Mental Health or Behavioral Health Crisis  call office if I gain more than 2 pounds in one day or 5 pounds in one week use salt in moderation watch for swelling in feet, ankles and legs every day follow rescue plan if symptoms flare-up  Follow Up Plan: Telephone follow up appointment with care management team member scheduled for: 07-22-2022           Immunization History  Administered Date(s) Administered   Fluad Quad(high Dose 65+) 02/26/2019, 01/17/2020, 01/29/2021, 01/20/2022   Influenza, High Dose Seasonal PF 02/03/2017, 01/20/2018   Influenza-Unspecified 01/28/2016, 01/30/2017   Moderna Sars-Covid-2 Vaccination 05/15/2019, 06/12/2019, 03/17/2020   Pneumococcal Conjugate-13 09/10/2013, 02/26/2014   Pneumococcal Polysaccharide-23 08/23/2010   Zoster Recombinat (Shingrix) 05/17/2022   Zoster,  Live 06/14/2011    Health Maintenance  Topic Date Due   DTaP/Tdap/Td (1 - Tdap) Never done   COVID-19 Vaccine (4 - 2023-24 season) 12/31/2021   Zoster Vaccines- Shingrix (2 of 2) 07/12/2022   Diabetic kidney evaluation - Urine ACR  07/21/2022   INFLUENZA VACCINE  12/01/2022   HEMOGLOBIN A1C  01/07/2023   OPHTHALMOLOGY EXAM  03/03/2023   Diabetic kidney evaluation - eGFR measurement  07/29/2023   FOOT EXAM  08/09/2023   Medicare Annual Wellness (AWV)  08/09/2023   Pneumonia Vaccine 30+ Years old  Completed   DEXA SCAN  Completed   HPV VACCINES  Aged Out     Discussed health benefits of physical activity, and encouraged her to engage in regular exercise appropriate for her age and condition.    Problem List Items Addressed This Visit        Cardiovascular and Mediastinum   Hypertension associated with diabetes    Well controlled Continue current medications Recheck metabolic panel F/u in 6 months       Relevant Medications   furosemide (LASIX) 20 MG tablet   Chronic systolic CHF (congestive heart failure)    Euvolemic today 12 pound weight gain over the weekend This is monitored by a remote cardiology service She was started on Lasix 20 mg daily and has had good diuresis Continue Lasix and potassium supplement Recheck metabolic panel      Relevant Medications   furosemide (LASIX) 20 MG tablet   Other Relevant Orders   Basic Metabolic Panel (BMET)     Endocrine   DM (diabetes mellitus)    Reviewed last A1c UACR, BMP, foot exam today Recheck A1c at next visit No changes to medications today      Relevant Orders   Urine Microalbumin w/creat. ratio     Other   Acute blood loss anemia    Secondary to GI bleed-now resolved Recheck CBC      Relevant Orders   CBC   Other Visit Diagnoses     Encounter for annual wellness visit (AWV) in Medicare patient    -  Primary   Encounter for annual physical exam            Return in about 3 months (around 11/08/2022) for chronic disease f/u.     I, Jessica Latch, MD, have reviewed all documentation for this visit. The documentation on 08/09/22 for the exam, diagnosis, procedures, and orders are all accurate and complete.   Wheeler, Marzella Schlein, MD, MPH [facility]

## 2022-08-09 ENCOUNTER — Ambulatory Visit (INDEPENDENT_AMBULATORY_CARE_PROVIDER_SITE_OTHER): Payer: Medicare HMO | Admitting: Family Medicine

## 2022-08-09 ENCOUNTER — Encounter: Payer: Self-pay | Admitting: Family Medicine

## 2022-08-09 VITALS — BP 130/69 | HR 78 | Temp 97.8°F | Resp 16 | Ht 63.0 in | Wt 131.9 lb

## 2022-08-09 DIAGNOSIS — Z0000 Encounter for general adult medical examination without abnormal findings: Secondary | ICD-10-CM

## 2022-08-09 DIAGNOSIS — I5022 Chronic systolic (congestive) heart failure: Secondary | ICD-10-CM | POA: Diagnosis not present

## 2022-08-09 DIAGNOSIS — E1169 Type 2 diabetes mellitus with other specified complication: Secondary | ICD-10-CM

## 2022-08-09 DIAGNOSIS — D62 Acute posthemorrhagic anemia: Secondary | ICD-10-CM

## 2022-08-09 DIAGNOSIS — I152 Hypertension secondary to endocrine disorders: Secondary | ICD-10-CM

## 2022-08-09 MED ORDER — TICAGRELOR 90 MG PO TABS
ORAL_TABLET | ORAL | Status: AC | PRN
  Administered 2022-07-08: 180 mg via ORAL

## 2022-08-09 NOTE — Assessment & Plan Note (Signed)
Secondary to GI bleed-now resolved Recheck CBC

## 2022-08-09 NOTE — Assessment & Plan Note (Signed)
Well controlled Continue current medications Recheck metabolic panel F/u in 6 months  

## 2022-08-09 NOTE — Assessment & Plan Note (Signed)
Reviewed last A1c UACR, BMP, foot exam today Recheck A1c at next visit No changes to medications today

## 2022-08-09 NOTE — Assessment & Plan Note (Signed)
Euvolemic today 12 pound weight gain over the weekend This is monitored by a remote cardiology service She was started on Lasix 20 mg daily and has had good diuresis Continue Lasix and potassium supplement Recheck metabolic panel

## 2022-08-10 ENCOUNTER — Encounter: Payer: Self-pay | Admitting: Cardiology

## 2022-08-10 ENCOUNTER — Telehealth: Payer: Self-pay | Admitting: *Deleted

## 2022-08-10 ENCOUNTER — Ambulatory Visit: Payer: Medicare HMO | Attending: Cardiology | Admitting: Cardiology

## 2022-08-10 VITALS — BP 129/71 | HR 84 | Ht 63.0 in | Wt 132.4 lb

## 2022-08-10 DIAGNOSIS — I25118 Atherosclerotic heart disease of native coronary artery with other forms of angina pectoris: Secondary | ICD-10-CM | POA: Diagnosis not present

## 2022-08-10 DIAGNOSIS — E1159 Type 2 diabetes mellitus with other circulatory complications: Secondary | ICD-10-CM

## 2022-08-10 DIAGNOSIS — I10 Essential (primary) hypertension: Secondary | ICD-10-CM

## 2022-08-10 DIAGNOSIS — I214 Non-ST elevation (NSTEMI) myocardial infarction: Secondary | ICD-10-CM

## 2022-08-10 DIAGNOSIS — I7102 Dissection of abdominal aorta: Secondary | ICD-10-CM | POA: Diagnosis not present

## 2022-08-10 DIAGNOSIS — I5020 Unspecified systolic (congestive) heart failure: Secondary | ICD-10-CM | POA: Diagnosis not present

## 2022-08-10 DIAGNOSIS — E782 Mixed hyperlipidemia: Secondary | ICD-10-CM | POA: Diagnosis not present

## 2022-08-10 DIAGNOSIS — K921 Melena: Secondary | ICD-10-CM

## 2022-08-10 DIAGNOSIS — I200 Unstable angina: Secondary | ICD-10-CM

## 2022-08-10 LAB — BASIC METABOLIC PANEL
BUN/Creatinine Ratio: 19 (ref 12–28)
BUN: 16 mg/dL (ref 8–27)
CO2: 22 mmol/L (ref 20–29)
Calcium: 9.5 mg/dL (ref 8.7–10.3)
Chloride: 95 mmol/L — ABNORMAL LOW (ref 96–106)
Creatinine, Ser: 0.83 mg/dL (ref 0.57–1.00)
Glucose: 137 mg/dL — ABNORMAL HIGH (ref 70–99)
Potassium: 3.9 mmol/L (ref 3.5–5.2)
Sodium: 134 mmol/L (ref 134–144)
eGFR: 70 mL/min/{1.73_m2} (ref >59)

## 2022-08-10 LAB — CBC
Hematocrit: 31.2 % — ABNORMAL LOW (ref 34.0–46.6)
Hemoglobin: 10.6 g/dL — ABNORMAL LOW (ref 11.1–15.9)
MCH: 29.9 pg (ref 26.6–33.0)
MCHC: 34 g/dL (ref 31.5–35.7)
MCV: 88 fL (ref 79–97)
Platelets: 301 10*3/uL (ref 150–450)
RBC: 3.55 x10E6/uL — ABNORMAL LOW (ref 3.77–5.28)
RDW: 12.6 % (ref 11.7–15.4)
WBC: 4.6 10*3/uL (ref 3.4–10.8)

## 2022-08-10 LAB — MICROALBUMIN / CREATININE URINE RATIO
Creatinine, Urine: 132.9 mg/dL
Microalb/Creat Ratio: 11 mg/g creat (ref 0–29)
Microalbumin, Urine: 14.7 ug/mL

## 2022-08-10 NOTE — Patient Instructions (Addendum)
Medication Instructions:  Your physician recommends that you continue on your current medications as directed. Please refer to the Current Medication list given to you today.  *If you need a refill on your cardiac medications before your next appointment, please call your pharmacy*   Lab Work:  Your physician recommends that you return for lab work in June 2024.  Lipid/Hepatic   - You will need to be fasting. Please do not have anything to eat or drink after midnight the morning you have the lab work. You may only have water or black coffee with no cream or sugar.  - Please go to the [facility]. You will check in at the front desk to the right as you walk into the atrium. Valet Parking is offered if needed. - No appointment needed. You may go any day between 7 am and 6 pm.   If you have labs (blood work) drawn today and your tests are completely normal, you will receive your results only by: MyChart Message (if you have MyChart) OR A paper copy in the mail If you have any lab test that is abnormal or we need to change your treatment, we will call you to review the results.   Testing/Procedures: Your physician has requested that you have an Limited echocardiogram. Limited Echocardiography is a painless test that uses sound waves to create images of your heart. It provides your doctor with information about the size and shape of your heart and how well your heart's chambers and valves are working. This procedure takes approximately one hour. There are no restrictions for this procedure. Please do NOT wear cologne, perfume, aftershave, or lotions (deodorant is allowed). Please arrive 15 minutes prior to your appointment time.    Follow-Up: At [facility], you and your health needs are our priority.  As part of our continuing mission to provide you with exceptional heart care, we have created designated Provider Care Teams.  These Care Teams include your primary  Cardiologist (physician) and Advanced Practice Providers (APPs -  Physician Assistants and Nurse Practitioners) who all work together to provide you with the care you need, when you need it.  We recommend signing up for the patient portal called "MyChart".  Sign up information is provided on this After Visit Summary.  MyChart is used to connect with patients for Virtual Visits (Telemedicine).  Patients are able to view lab/test results, encounter notes, upcoming appointments, etc.  Non-urgent messages can be sent to your provider as well.   To learn more about what you can do with MyChart, go to [email or web address].    Your next appointment:   2 month(s)  Patient would like to switch provider to Dr. Okey Dupre  Provider:   You may see Yvonne Kendall, MD or one of the following Advanced Practice Providers on your designated Care Team:   Nicolasa Ducking, NP Eula Listen, PA-C Cadence Fransico Michael, PA-C Charlsie Quest, NP

## 2022-08-10 NOTE — Telephone Encounter (Signed)
Good after noon Dr.Gollan and Dr. Okey Dupre,   Just wanted to reach out to you both concerning this patient who saw Charlsie Quest, NP this afternoon.  Patient is requesting to switch her care provider from Dr. Mariah Milling to Dr. Okey Dupre for future follow ups.  I wanted to make you both aware of patient's request today. Please let me know if you both are ok with this patient's request.

## 2022-08-10 NOTE — Progress Notes (Signed)
Cardiology Office Note:   Date:  08/11/2022  ID:  Jessica Wheeler, DOB [birth year], MRN [id number]  History of Present Illness:   Jessica Wheeler is a 82 y.o. female with a past medical history of coronary artery disease status post recent NSTEMI and LAD stenting, ICM/HFrEF, (EF 35-40%), hypertension, hyperlipidemia, type 2 diabetes, aortic dissection, renal artery stenosis, pancreatitis, chronic hyponatremia, who presents today for follow-up.  In early March she was admitted with chest pain and ruled in for non-STEMI.  EF was 35-40% by echo.  She underwent diagnostic left heart catheterization revealing multivessel CAD with culprit being severe mid LAD stenosis which was successfully treated with DES.  She had elevated right heart pressures that required diuresis.  She was subsequently discharged home on aspirin, statin, Brilinta, and ARB therapy with prior intolerance to beta-blockers.  2 days post hospital discharge she noted dark stools and left lower quadrant pain.  She saw primary care provider in March and he was found to be anemic and was subsequently passed present to the emergency department.  She was placed on IV Protonix.  In the setting of GI bleed Brilinta was discontinued in favor of Plavix.  She was volume overloaded requiring diuresis.  She does not require blood transfusion at that time.  She was last seen in clinic 07/20/2018.  She was doing fairly well.  She was walking regularly attending cardiac rehab without symptoms or limitation.  On 3/28 she was getting sharp when she had sudden onset of severe substernal chest tightness associated with dyspnea.  She called EMS.  ECG was completed which revealed right bundle branch block and inferior lateral T changes similar to prior EKG.  She was given 4 baby aspirin for ongoing symptoms.  She was not advised to go to the emergency department and offered nitroglycerin at that time.  She was simply told to call back if she continued up to symptoms  later in the day.  Unfortunately she continues with substernal chest tightness that is significant fatigue and dyspnea with exertion today.  At her return appointment she currently reported 2-3 out of 10 chest tightness and did become dyspneic walking into the office.  She was taken to the emergency department for further evaluation with continued chest discomfort.  Patient was evaluated emergency department 07/29/2022 where she continued chest discomfort.  Blood pressure was slightly elevated at 173/78.  Continued workup was unrevealing and she was subsequently discharged home with a prescription for Nitrostat.  She also had had a call from a telehealth provider for heart failure.  At that time she was started on furosemide and potassium supplements.  They also discussed increasing GDMT for ischemic cardiomyopathy.  She is asking questions today if it was safe to start an MRA and what medication was not that as well.  She returns clinic today with a list of questions regarding her previous non-ST elevated myocardial infarction and her left heart function.  She states that she occasionally has chest pressure but it is related to when she has abdominal bloating.  She stated she had previously been on diuretic therapy for 20 years prior and then her initial discharge from the hospital her HCTZ was discontinued.  We did discuss that the discontinuation of the HCTZ was from the chronic hyponatremia.  She stated she had gained 12 pounds prior to being started on furosemide by the provider at [facility]. We also discussed her heart functioning with being reduced and reiterated that clinic said that the telehealth  provider had spoken to her with as far as goal-directed medical therapy.  She was advised that starting an MRA of spironolactone would likely be beneficial for her and that was the plan that they had advised would start on Saturday.  She has been able to participate in cardiac rehab without incident.   The questions that she is asking today related to when she can ride her riding lawnmower, when she can trim her bushes, how much weight is too much weight for her to pick up, and how much activity she supposed to be able to tolerate.  ROS: 10 point review systems has been completed and considered negative with exception of what listed in the HPI  Studies Reviewed:    EKG: No new tracings that were completed today TTE 07/09/22 1. Left ventricular ejection fraction, by estimation, is 35 to 40%. The  left ventricle has moderately decreased function. The left ventricle  demonstrates global hypokinesis, basal wall motion best preserved possibly  consistent with stress cardioyopathy.   Left ventricular diastolic parameters are consistent with Grade I  diastolic dysfunction (impaired relaxation).   2. Right ventricular systolic function is normal. The right ventricular  size is normal. Tricuspid regurgitation signal is inadequate for assessing  PA pressure.   3. The mitral valve is normal in structure. Mild mitral valve  regurgitation. No evidence of mitral stenosis.   4. The aortic valve was not well visualized. Aortic valve regurgitation  is not visualized. No aortic stenosis is present.   5. The inferior vena cava is normal in size with greater than 50%  respiratory variability, suggesting right atrial pressure of 3 mmHg.   LHC 07/08/22 Conclusions: Significant multivessel coronary artery disease, as detailed below.  Culprit lesion for the patient's NSTEMI is a hazy 99% stenosis of the mid LAD with TIMI-1 flow.  In addition, there is moderate disease involving the distal LMCA, proximal/mid LAD, and RCA, as detailed below. Moderately elevated left heart filling pressures (LVEDP 30 mmHg, PCWP 26 mmHg). Mildly-moderately elevated right heart filling pressures (mean RA 11 mmHg, RVEDP 14 mmHg). Normal Fick cardiac output/index (CO 4.6 L/min, CI 2.8 L/min/m). Successful PCI to 99% mid LAD stenosis  using Onyx frontier 2.0 x 12 mm drug-eluting stent with 0% residual stenosis and improvement in flow from TIMI-1 to TIMI-2.   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 Furosemide 40 mg IV given x 1 at the end of the procedure.  Consider redosing as needed based on urine output.  Potassium repletion also ordered. Aggressive secondary prevention of coronary artery disease. Initiate goal-directed medical therapy for acute HFrEF, as tolerated.  In the setting of soft blood pressure, I will decrease losartan to 25 mg daily.  Once the patient appears euvolemic, rechallenging with beta-blocker will need to be considered.  Risk Assessment/Calculations:              Physical Exam:   VS:  BP 129/71 (BP Location: Left Arm, Patient Position: Sitting, Cuff Size: Normal)   Pulse 84   Ht 5\' 3"  (1.6 m)   Wt 132 lb 6.4 oz (60.1 kg)   SpO2 98%   BMI 23.45 kg/m    Wt Readings from Last 3 Encounters:  08/10/22 132 lb 6.4 oz (60.1 kg)  08/09/22 131 lb 14.4 oz (59.8 kg)  08/02/22 136 lb (61.7 kg)     GEN: Well nourished, well developed in no acute distress NECK: No JVD;  No carotid bruits CARDIAC: RRR, II/VI systolic murmur, without rubs, gallops RESPIRATORY:  Clear to auscultation without rales, wheezing or rhonchi  ABDOMEN: Soft, non-tender, non-distended EXTREMITIES:  No edema; No deformity   ASSESSMENT AND PLAN:   Non-STEMI, coronary artery disease, stable angina.  Patient was admitted earlier this month with chest pain and dyspnea and ruled in for non-STEMI.  She was found to have LV dysfunction and subsequent cath revealed severe mid LAD disease with otherwise moderate diffuse multivessel disease.  The LAD was successfully treated with PCI/DES.  She was readmitted a few days later with mild anemia and melena and her Brilinta was subsequently discontinued and she was changed to clopidogrel.  She has been doing well  since that time and was exercising regularly with cardiac rehab without chest pain or dyspnea however she did develop chest tightness and dyspnea on 328 prompting EMS evaluation.  EMS completed an ECG that was unchanged and she was advised to call again for any other concerns.  She then returned to her office visit on 07/29/2022 with complaints of unstable angina concerning for she was sent to the emergency department for further evaluation.  Her workup in the emergency department was unremarkable and she was subsequently discharged home.  She continues to be pain-free today.  She is continued on aspirin 81 mg daily, atorvastatin 80 mg daily, clopidogrel 75 mg daily, and she has Nitrostat 0.4 mg sublingual as needed.  Encouraged to continue with her cardiac rehab.  Chronic heart failure with reduced ejection fraction/ischemic cardiomyopathy with an EF of 35-40% with global hypokinesis by echo during her recent hospitalization.  She had previous complaints of abdominal bloating and some dysphagia.  She received a call from [facility] and stated a provider from [place] started her on furosemide and potassium supplements.  Since that time she has been doing well.  She is euvolemic on exam today.  She has been continued on losartan 25 mg daily and furosemide 20 mg daily.  We have discussed changing her losartan to [facility] in the future and potentially starting spironolactone.  Previously she was intolerant to SGLT2 inhibitor (Jardiance) or beta-blocker therapy as it had previously caused chest discomfort.  She has been scheduled for limited echocardiogram in June to reevaluate her heart function.  She is reminded to weigh herself daily and to keep a log other than primarily it is better to weigh after she gets up in the morning and voids.  She has been encouraged to continue to monitor fluid and sodium intake as well as to try and avoid prepackaged and preprocessed foods.  Mixed hyperlipidemia with an LDL of 82  when she arrived to the [facility] emergency department.  She has been continued on high intensity statin therapy of atorvastatin 80 mg daily and thus far has been tolerant.  She has also been scheduled for follow-up lipid and hepatic panel.  Essential hypertension with blood pressure today 129/71.  Blood pressures remained stable.  She is currently on losartan 25 mg daily and furosemide 20 mg daily.  She has been scheduled for BMP to evaluate electrolytes and kidney function after being started on diuretic therapy.  History of abdominal aortic dissection previously noted on CT imaging in 2010.  No dissection noted on ultrasound in 2013.  Previously this has been followed by vascular surgery.  History of recent GI bleed/normocytic anemia.  She denies any noted blood in stool or urine or black tarry stools.  Repeat blood work in the emergency department hemoglobin  was stable at 10.6.  She is continued on PPI therapy with Protonix 40 mg twice daily with outpatient GI follow-up pending.  Type 2 diabetes hemoglobin A1c is 7.1.  She is continued on metformin.  Previously did not tolerate SGLT2 inhibitor.  This continues to be managed by her PCP  Disposition patient to return to clinic to see MD/APP in 6 weeks or sooner if needed depending on symptoms.  She states that she has several calls and several appointments upcoming.        Signed, Makai Dumond, NP

## 2022-08-11 ENCOUNTER — Telehealth: Payer: Self-pay | Admitting: Cardiology

## 2022-08-11 ENCOUNTER — Encounter: Payer: Medicare HMO | Admitting: *Deleted

## 2022-08-11 DIAGNOSIS — Z955 Presence of coronary angioplasty implant and graft: Secondary | ICD-10-CM | POA: Diagnosis not present

## 2022-08-11 DIAGNOSIS — I252 Old myocardial infarction: Secondary | ICD-10-CM | POA: Diagnosis not present

## 2022-08-11 DIAGNOSIS — I214 Non-ST elevation (NSTEMI) myocardial infarction: Secondary | ICD-10-CM

## 2022-08-11 DIAGNOSIS — Z48812 Encounter for surgical aftercare following surgery on the circulatory system: Secondary | ICD-10-CM | POA: Diagnosis not present

## 2022-08-11 NOTE — Progress Notes (Signed)
Daily Session Note  Patient Details  Name: Jessica Wheeler MRN: [id number] Date of Birth: 12-31-1940 Referring Provider:   Flowsheet Row Cardiac Rehab from 07/25/2022 in [facility]  Referring Provider End, Cristal Deer MD       Encounter Date: 08/11/2022  Check In:  Session Check In - 08/11/22 0738       Check-In   Supervising physician immediately available to respond to emergencies See telemetry face sheet for immediately available ER MD    Location [facility]    Staff Present Lanny Hurst, RN, ADN;Jessica Juanetta Gosling, MA, RCEP, CCRP, CCET;Joseph [place], [place]    Virtual Visit No    Medication changes reported     No    Fall or balance concerns reported    No    Warm-up and Cool-down Performed on first and last piece of equipment    Resistance Training Performed Yes    VAD Patient? No    PAD/SET Patient? No      Pain Assessment   Currently in Pain? No/denies                Social History   Tobacco Use  Smoking Status Former   Packs/day: 1.00   Years: 15.00   Additional pack years: 0.00   Total pack years: 15.00   Types: Cigarettes   Quit date: 08/03/2003   Years since quitting: 19.0  Smokeless Tobacco Never    Goals Met:  Independence with exercise equipment Exercise tolerated well No report of concerns or symptoms today Strength training completed today  Goals Unmet:  Not Applicable  Comments: Pt able to follow exercise prescription today without complaint.  Will continue to monitor for progression.    Dr. Bethann Punches is Medical Director for [facility].  Dr. Vida Rigger is Medical Director for [facility] Pulmonary [facility].

## 2022-08-11 NOTE — Telephone Encounter (Signed)
Faxed OV from 08/10/22 to [facility] at [phone]

## 2022-08-11 NOTE — Telephone Encounter (Signed)
[facility] called in asking for OV note from 08/10/22 to be faxed to their office.    Fax number: [phone]

## 2022-08-12 ENCOUNTER — Telehealth: Payer: Self-pay | Admitting: Cardiovascular Disease

## 2022-08-12 ENCOUNTER — Other Ambulatory Visit (HOSPITAL_COMMUNITY): Payer: Self-pay

## 2022-08-12 ENCOUNTER — Telehealth: Payer: Self-pay | Admitting: *Deleted

## 2022-08-12 ENCOUNTER — Encounter: Payer: Self-pay | Admitting: Family

## 2022-08-12 ENCOUNTER — Ambulatory Visit: Payer: Medicare HMO | Attending: Family | Admitting: Family

## 2022-08-12 VITALS — BP 138/54 | HR 90 | Wt 132.8 lb

## 2022-08-12 DIAGNOSIS — I4510 Unspecified right bundle-branch block: Secondary | ICD-10-CM | POA: Diagnosis not present

## 2022-08-12 DIAGNOSIS — I10 Essential (primary) hypertension: Secondary | ICD-10-CM | POA: Diagnosis not present

## 2022-08-12 DIAGNOSIS — Z7984 Long term (current) use of oral hypoglycemic drugs: Secondary | ICD-10-CM | POA: Diagnosis not present

## 2022-08-12 DIAGNOSIS — I2510 Atherosclerotic heart disease of native coronary artery without angina pectoris: Secondary | ICD-10-CM | POA: Diagnosis not present

## 2022-08-12 DIAGNOSIS — I5020 Unspecified systolic (congestive) heart failure: Secondary | ICD-10-CM | POA: Diagnosis not present

## 2022-08-12 DIAGNOSIS — I252 Old myocardial infarction: Secondary | ICD-10-CM | POA: Insufficient documentation

## 2022-08-12 DIAGNOSIS — I25118 Atherosclerotic heart disease of native coronary artery with other forms of angina pectoris: Secondary | ICD-10-CM

## 2022-08-12 DIAGNOSIS — E1159 Type 2 diabetes mellitus with other circulatory complications: Secondary | ICD-10-CM

## 2022-08-12 DIAGNOSIS — I110 Hypertensive heart disease with heart failure: Secondary | ICD-10-CM | POA: Insufficient documentation

## 2022-08-12 DIAGNOSIS — R0602 Shortness of breath: Secondary | ICD-10-CM | POA: Insufficient documentation

## 2022-08-12 DIAGNOSIS — Z7982 Long term (current) use of aspirin: Secondary | ICD-10-CM | POA: Insufficient documentation

## 2022-08-12 DIAGNOSIS — R42 Dizziness and giddiness: Secondary | ICD-10-CM | POA: Diagnosis not present

## 2022-08-12 DIAGNOSIS — I5022 Chronic systolic (congestive) heart failure: Secondary | ICD-10-CM | POA: Insufficient documentation

## 2022-08-12 DIAGNOSIS — Z7902 Long term (current) use of antithrombotics/antiplatelets: Secondary | ICD-10-CM | POA: Insufficient documentation

## 2022-08-12 DIAGNOSIS — E119 Type 2 diabetes mellitus without complications: Secondary | ICD-10-CM | POA: Diagnosis not present

## 2022-08-12 DIAGNOSIS — Z87891 Personal history of nicotine dependence: Secondary | ICD-10-CM | POA: Insufficient documentation

## 2022-08-12 DIAGNOSIS — Z79899 Other long term (current) drug therapy: Secondary | ICD-10-CM | POA: Diagnosis not present

## 2022-08-12 DIAGNOSIS — R5383 Other fatigue: Secondary | ICD-10-CM | POA: Insufficient documentation

## 2022-08-12 DIAGNOSIS — E785 Hyperlipidemia, unspecified: Secondary | ICD-10-CM | POA: Diagnosis not present

## 2022-08-12 NOTE — Telephone Encounter (Signed)
Fax from today had transmission failure. Called number to verify if active fax number but it was not in service. Placed call to [facility] at [phone] and left voicemail message to call back.

## 2022-08-12 NOTE — Telephone Encounter (Signed)
Spoke with Jessica Wheeler with [facility]. Number we had was not correct. Fax number is [phone] and verified twice with her. Will refax records per her request. No further needs.

## 2022-08-12 NOTE — Telephone Encounter (Signed)
Jessica Wheeler from [facility] called back with the correct fax number that the OV notes from 08/10/22 needs to be sent to. The correct fax number is [phone]. Please advise

## 2022-08-12 NOTE — Telephone Encounter (Signed)
-----   Message from Charlsie Quest, NP sent at 08/11/2022  8:21 PM EDT ----- Regarding: Note Please fax the completed note from 08/10/22 to [facility]. Their fax number is listed in a previous telephone encounter. Thank you, Lavonna Rua

## 2022-08-12 NOTE — Telephone Encounter (Signed)
OV note faxed yesterday and then again today.

## 2022-08-12 NOTE — Telephone Encounter (Signed)
Jessica Wheeler from [facility] is calling again asking for the OV note from 08/10/22 to be faxed to their office. Jessica Wheeler states that they did not receive the fax from yesterday. Jessica Wheeler stated couldn't find the correct fax number , she stated she is going to call back with the correct fax number that it should be sent to.

## 2022-08-12 NOTE — Progress Notes (Signed)
Patient ID: Jessica Wheeler, female    DOB: 1940-08-16, 82 y.o.   MRN: [id number]  Primary cardiologist: Julien Nordmann, MD (last seen 04/24; returns 06/24) PCP: Erasmo Downer, MD (last seen 04/24)  HPI  Jessica Wheeler is a 82 y/o female with a history of CAD, DM, hyperlipidemia, HTN, RBBB, previous tobacco use and chronic heart failure.   Echo 07/09/22: EF 35-40% along with mild MR  RHC/LHC 07/08/22:  Significant multivessel coronary artery disease, as detailed below.  Culprit lesion for the patient's NSTEMI is a hazy 99% stenosis of the mid LAD with TIMI-1 flow.  In addition, there is moderate disease involving the distal LMCA, proximal/mid LAD, and RCA, as detailed below. Moderately elevated left heart filling pressures (LVEDP 30 mmHg, PCWP 26 mmHg). Mildly-moderately elevated right heart filling pressures (mean RA 11 mmHg, RVEDP 14 mmHg). Normal Fick cardiac output/index (CO 4.6 L/min, CI 2.8 L/min/m). Successful PCI to 99% mid LAD stenosis using Onyx frontier 2.0 x 12 mm drug-eluting stent with 0% residual stenosis and improvement in flow from TIMI-1 to TIMI-2.  Was in the ED 07/29/22 due to chest pain, fatigue and SOB. Admitted 07/19/22 due to GIB. Anticoag changed. Admitted 07/07/22 due to acute onset of midsternal chest pain. Troponin peaked at 6442. Cath and DES placed in LAD. IV lasix given.    She presents today for her initial visit with a chief complaint of moderate fatigue with minimal exertion. Has been present for several weeks. Has associated SOB (improving), dizziness and easy bruising along with this. Denies difficulty sleeping, abdominal distention, palpitations, pedal edema, chest pain, cough or weight gain.   Did note abdominal distention about a week ago and the [facility] provider started her on furosemide 20mg  / potassium daily and she notes improvement of this since that time. She did have to take an additional dose a couple of days ago due to weight gain. Since  starting the diuretic, she also feels less SOB and feels like she can walk further.   Expresses great disappointment in the health care system beginning with her admission in early March. She feels let down by the providers and that she wasn't listened to.   Past Medical History:  Diagnosis Date   Allergy    Arthritis    Bundle branch block, right    C7 cervical fracture    CAD (coronary artery disease)    a. 06/2005 Cath: LM nl, LAD 20m, LCX nl, RCA 20ost->med rx; b. 07/2022 NSTEMI/PCI: LM 40d, LAD 50p, 40p/m, 79m (2.0x19 Onyx Frontier DES), RI 20, LCX small, mild/diff dzs, RCA 60ost, 65p, 60d. EF 25-35%.   CHF (congestive heart failure)    Chronic HFrEF (heart failure with reduced ejection fraction)    a. 10/2011 Echo: EF 65-70%; b. 07/2022 Echo: EF 35-40%, glob HK, basal wall motion best preserved. GrI DD, nl RV fxn, mild MR.   Diabetes mellitus    Diffuse cystic mastopathy    Dissection, aorta    Dr.Dew follows   Diverticulosis    Family history of malignant neoplasm of gastrointestinal tract    Glaucoma 2005   Heart murmur    History of bronchitis    History of pancreatitis 2005   Hyperlipidemia LDL goal <70    Hypertension 1982   Ischemic cardiomyopathy    a. 07/2022 Echo: EF 35-40%.   Personal history of tobacco use, presenting hazards to health    RBBB    Ulcer    Past Surgical History:  Procedure Laterality Date   ABDOMINAL HYSTERECTOMY  1991   ANTERIOR CERVICAL CORPECTOMY N/A 03/30/2020   Procedure: CORPECTOMY CERVICAL FOUR - CERVICAL FIVE;  Surgeon: Tressie Stalker, MD;  Location: [facility] OR;  Service: Neurosurgery;  Laterality: N/A;  anterior   APPENDECTOMY     BACK SURGERY  1982   for lumbar ruptured disc. No hardware, no discectomy, no fusion   BLADDER SURGERY     BREAST BIOPSY Right 1980's   benign   BREAST EXCISIONAL BIOPSY Right 1980s   benign   BREAST SURGERY     biopsy   CARDIAC CATHETERIZATION     Dr. Lady Gary, few years ago   CATARACT EXTRACTION W/PHACO Left  08/31/2015   Procedure: CATARACT EXTRACTION PHACO AND INTRAOCULAR LENS PLACEMENT (IOC);  Surgeon: Sherald Hess, MD;  Location: [facility];  Service: Ophthalmology;  Laterality: Left;  DIABETIC - oral meds   CATARACT EXTRACTION W/PHACO Right 10/12/2015   Procedure: CATARACT EXTRACTION PHACO AND INTRAOCULAR LENS PLACEMENT (IOC) right eye;  Surgeon: Sherald Hess, MD;  Location: [facility];  Service: Ophthalmology;  Laterality: Right;  DIABETIC - oral meds   CHOLECYSTECTOMY  1994   COLONOSCOPY  2008,02/22/2012   Dr. Sankar-2013   COLONOSCOPY WITH PROPOFOL N/A 04/04/2017   Procedure: COLONOSCOPY WITH PROPOFOL;  Surgeon: [name], MD;  Location: [facility] ENDOSCOPY;  Service: Endoscopy;  Laterality: N/A;   CORONARY STENT INTERVENTION N/A 07/08/2022   Procedure: CORONARY STENT INTERVENTION;  Surgeon: Yvonne Kendall, MD;  Location: [facility] INVASIVE CV LAB;  Service: Cardiovascular;  Laterality: N/A;   POSTERIOR CERVICAL FUSION/FORAMINOTOMY N/A 04/01/2020   Procedure: POSTERIOR CERVICAL FUSION/FORAMINOTOMY CERVICAL FOUR- CERVICAL SIX;  Surgeon: Tressie Stalker, MD;  Location: [facility] OR;  Service: Neurosurgery;  Laterality: N/A;   REFRACTIVE SURGERY     RIGHT/LEFT HEART CATH AND CORONARY ANGIOGRAPHY N/A 07/08/2022   Procedure: RIGHT/LEFT HEART CATH AND CORONARY ANGIOGRAPHY;  Surgeon: Yvonne Kendall, MD;  Location: [facility] INVASIVE CV LAB;  Service: Cardiovascular;  Laterality: N/A;   Family History  Problem Relation Age of Onset   Diabetes Mother    Hypertension Mother    Anxiety disorder Mother    Heart disease Father        rheumatic heart disease   Heart attack Father    Rheum arthritis Father    Colon cancer Brother    Hypertension Maternal Grandmother    Hypertension Maternal Grandfather    Breast cancer Maternal Aunt 40   Breast cancer Cousin 80   Social History   Tobacco Use   Smoking status: Former    Packs/day: 1.00    Years: 15.00     Additional pack years: 0.00    Total pack years: 15.00    Types: Cigarettes    Quit date: 08/03/2003    Years since quitting: 19.0   Smokeless tobacco: Never  Substance Use Topics   Alcohol use: No   Allergies  Allergen Reactions   Beta Adrenergic Blockers     Chest pressure/difficulty breathing   Jardiance [Empagliflozin]     Muscle cramps   Levaquin [Levofloxacin In D5w]     Low blood sugar; does take this when she needs it. She just makes sure to control her BS to avoid hypoglycemia.   Lisinopril Cough   Zetia [Ezetimibe]     Lethargic    Prevnar [Pneumococcal 13-Val Conj Vacc] Rash    Joint Pain   Sulfa Antibiotics Rash    "makes my skin raw"   Prior to Admission medications  Medication Sig Start Date End Date Taking? Authorizing Provider  Accu-Chek FastClix Lancets MISC TEST FASTING BLOOD SUGAR EVERY DAY 05/17/22  Yes Bacigalupo, Marzella Schlein, MD  aspirin 81 MG EC tablet Take 81 mg by mouth daily.   Yes [provider]  atorvastatin (LIPITOR) 80 MG tablet Take 1 tablet (80 mg total) by mouth at bedtime. 08/02/22  Yes Bacigalupo, Marzella Schlein, MD  Blood Glucose Monitoring Suppl (ACCU-CHEK GUIDE) w/Device KIT USE AS DIRECTED TO CHECK FASTING BLOOD SUGAR DAILY 12/11/20  Yes Bacigalupo, Marzella Schlein, MD  clopidogrel (PLAVIX) 75 MG tablet Take 1 tablet (75 mg total) by mouth daily. 08/02/22  Yes Bacigalupo, Marzella Schlein, MD  cyclobenzaprine (FLEXERIL) 5 MG tablet Take ONE tab PO BID/TID PRN   Yes [provider]  furosemide (LASIX) 20 MG tablet Take 20 mg by mouth daily. 08/06/22  Yes [provider]  glucose blood (ACCU-CHEK GUIDE) test strip TEST FASTING BLOOD SUGAR EVERY DAY AS DIRECTED 07/20/21  Yes Bacigalupo, Marzella Schlein, MD  Lancets Misc. (ACCU-CHEK FASTCLIX LANCET) KIT 1 each by Does not apply route as directed. 06/04/20  Yes Bacigalupo, Marzella Schlein, MD  latanoprost (XALATAN) 0.005 % ophthalmic solution Place 1 drop into both eyes at bedtime.  04/09/15  Yes [provider]  losartan (COZAAR) 25 MG tablet Take 1 tablet (25 mg total) by mouth daily. 07/22/22  Yes Simmons-Robinson, Makiera, MD  Magnesium 250 MG TABS Take 250 mg by mouth daily.   Yes [provider]  metFORMIN (GLUCOPHAGE) 500 MG tablet TAKE 2 TABLETS TWICE DAILY WITH MEALS 05/06/22  Yes Bacigalupo, Marzella Schlein, MD  nitroGLYCERIN (NITROSTAT) 0.4 MG SL tablet Place 1 tablet (0.4 mg total) under the tongue every 5 (five) minutes as needed for chest pain. 07/29/22 07/29/23 Yes Merwyn Katos, MD  pantoprazole (PROTONIX) 40 MG tablet Take 1 tablet (40 mg total) by mouth 2 (two) times daily before a meal. 08/02/22 09/01/22 Yes Bacigalupo, Marzella Schlein, MD  potassium chloride SA (KLOR-CON M) 20 MEQ tablet Take 20 mEq by mouth daily. 08/06/22  Yes [provider]  vitamin B-12 (CYANOCOBALAMIN) 1000 MCG tablet Take 1,000 mcg by mouth daily.   Yes [provider]   Review of Systems  Constitutional:  Positive for fatigue (easily). Negative for appetite change.  HENT:  Negative for congestion, postnasal drip and sore throat.   Eyes: Negative.   Respiratory:  Positive for shortness of breath. Negative for cough and chest tightness.   Cardiovascular:  Negative for chest pain and leg swelling.  Gastrointestinal:  Negative for abdominal distention (not now) and abdominal pain.  Endocrine: Negative.   Genitourinary: Negative.   Musculoskeletal:  Negative for arthralgias and neck pain.  Skin: Negative.   Allergic/Immunologic: Negative.   Neurological:  Positive for dizziness and light-headedness.  Hematological:  Negative for adenopathy. Bruises/bleeds easily.  Psychiatric/Behavioral:  Negative for sleep disturbance (sleeping on 1 pillow).     Vitals:   08/12/22 1123  BP: (!) 138/54  Pulse: 90  SpO2: 100%  Weight: 132 lb 12.8 oz (60.2 kg)   Wt Readings from Last 3 Encounters:  08/12/22 132 lb 12.8 oz (60.2 kg)  08/10/22 132 lb 6.4 oz (60.1 kg)  08/09/22 131 lb 14.4 oz (59.8 kg)   Lab  Results  Component Value Date   CREATININE 0.83 08/09/2022   CREATININE 0.81 07/29/2022   CREATININE 0.78 07/20/2022   Physical Exam Vitals and nursing note reviewed.  Constitutional:      Appearance: Normal appearance.  HENT:     Head: Normocephalic and atraumatic.  Cardiovascular:     Rate and Rhythm: Normal rate and regular rhythm.  Pulmonary:     Effort: Pulmonary effort is normal. No respiratory distress.     Breath sounds: No wheezing, rhonchi or rales.  Abdominal:     General: There is no distension.     Palpations: Abdomen is soft.     Tenderness: There is no abdominal tenderness.  Musculoskeletal:        General: No tenderness.     Cervical back: Normal range of motion and neck supple.     Right lower leg: No edema.     Left lower leg: No edema.  Skin:    General: Skin is warm and dry.  Neurological:     General: No focal deficit present.     Mental Status: She is alert and oriented to person, place, and time.  Psychiatric:        Mood and Affect: Mood normal. Affect is tearful (at times).        Behavior: Behavior normal.   Assessment & Plan:  1: Ischemic heart failure with reduced ejection fraction- - NYHA class III - euvolemic today - weighing daily; understands what weight gain to call for - Echo 07/09/22: EF 35-40% along with mild MR - has repeat echo scheduled for 06/24 - continue furosemide  daily/ potassium daily - continue losartan  daily - difficulty breathing/ chest pressure with beta-blockers - muscle cramps w/ jardiance - participating in [facility] HF program - not adding salt and is reading food labels for sodium content and understands to try and not have more than / day - BNP 07/19/22 was 730.6  2: HTN- - BP 138/54 - saw PCP Beryle Flock) 04/24 - BMP 08/09/22 showed sodium 134, potassium 3.9, creatinine 0.83 & GFR 70  3: CAD- - saw cardiology Cathe Mons) 04/24 - currently participating in cardiac rehab - RHC/LHC  07/08/22: Culprit lesion for the patient's NSTEMI is a hazy 99% stenosis of the mid LAD with TIMI-1 flow.  In addition, there is moderate disease involving the distal LMCA, proximal/mid LAD, and RCA. Moderately elevated left heart filling pressures (LVEDP 30 mmHg, PCWP 26 mmHg). Mildly-moderately elevated right heart filling pressures (mean RA 11 mmHg, RVEDP 14 mmHg). Normal Fick cardiac output/index (CO 4.6 L/min, CI 2.8 L/min/m). Successful PCI to 99% mid LAD stenosis using Onyx frontier 2.0 x 12 mm drug-eluting stent with 0% residual stenosis and improvement in flow from TIMI-1 to TIMI-2. - continue ASA  daily - continue clopidogrel  daily  4: DM- - A1c 07/07/22 was 7.1% - continue metformin  BID   Emotional support given regarding her unpleasant experiences regarding her medical care where she feels like she slipped through the cracks numerous times. Apologized for her feeling let down with her providers and the hospital. Contact information given for Patient Relations/ Grievance Dept so that she can express her feelings to them. She was appreciative of this information.   Return here in 6 weeks, sooner if needed.

## 2022-08-12 NOTE — Patient Instructions (Signed)
Patient Relations and Grievance Department at [phone]

## 2022-08-15 ENCOUNTER — Other Ambulatory Visit (HOSPITAL_COMMUNITY): Payer: Self-pay

## 2022-08-16 ENCOUNTER — Encounter: Payer: Medicare HMO | Admitting: *Deleted

## 2022-08-16 DIAGNOSIS — I252 Old myocardial infarction: Secondary | ICD-10-CM | POA: Diagnosis not present

## 2022-08-16 DIAGNOSIS — I214 Non-ST elevation (NSTEMI) myocardial infarction: Secondary | ICD-10-CM

## 2022-08-16 DIAGNOSIS — Z955 Presence of coronary angioplasty implant and graft: Secondary | ICD-10-CM

## 2022-08-16 DIAGNOSIS — Z48812 Encounter for surgical aftercare following surgery on the circulatory system: Secondary | ICD-10-CM | POA: Diagnosis not present

## 2022-08-16 NOTE — Progress Notes (Signed)
Daily Session Note  Patient Details  Name: Jessica Wheeler MRN: [id number] Date of Birth: [birth year] Referring Provider:   Flowsheet Row Cardiac Rehab from 07/25/2022 in [facility] Cardiac and Pulmonary Rehab  Referring Provider End, Cristal Deer MD       Encounter Date: 08/16/2022  Check In:  Session Check In - 08/16/22 0727       Check-In   Supervising physician immediately available to respond to emergencies See telemetry face sheet for immediately available ER MD    Location [facility]-Cardiac & Pulmonary Rehab    Staff Present Cyndia Diver, RN, BSN, Damita Dunnings, MA, RCEP, CCRP, Zackery Barefoot, MS, ACSM CEP, Exercise Physiologist    Virtual Visit No    Medication changes reported     Yes    Comments Lasix increased to 40 mg/day prn, based on weight    Fall or balance concerns reported    No    Tobacco Cessation No Change    Warm-up and Cool-down Performed on first and last piece of equipment    Resistance Training Performed Yes    VAD Patient? No    PAD/SET Patient? No      Pain Assessment   Currently in Pain? No/denies                Social History   Tobacco Use  Smoking Status Former   Packs/day: 1.00   Years: 15.00   Additional pack years: 0.00   Total pack years: 15.00   Types: Cigarettes   Quit date: 08/03/2003   Years since quitting: 19.0  Smokeless Tobacco Never    Goals Met:  Independence with exercise equipment Exercise tolerated well No report of concerns or symptoms today  Goals Unmet:  Not Applicable  Comments: Pt able to follow exercise prescription today without complaint.  Will continue to monitor for progression.    Dr. Bethann Punches is Medical Director for [facility] Cardiac Rehabilitation.  Dr. Vida Rigger is Medical Director for [facility] Pulmonary Rehabilitation.

## 2022-08-17 ENCOUNTER — Encounter: Payer: Self-pay | Admitting: *Deleted

## 2022-08-17 DIAGNOSIS — Z955 Presence of coronary angioplasty implant and graft: Secondary | ICD-10-CM

## 2022-08-17 DIAGNOSIS — I214 Non-ST elevation (NSTEMI) myocardial infarction: Secondary | ICD-10-CM

## 2022-08-17 NOTE — Progress Notes (Signed)
Cardiac Individual Treatment Plan  Patient Details  Name: Jessica LECKER MRN: [id number] Date of Birth: [birth year] Referring Provider:   Flowsheet Row Cardiac Rehab from 07/25/2022 in [facility] Cardiac and Pulmonary Rehab  Referring Provider End, Cristal Deer MD       Initial Encounter Date:  Flowsheet Row Cardiac Rehab from 07/25/2022 in [facility] Cardiac and Pulmonary Rehab  Date 07/25/22       Visit Diagnosis: NSTEMI (non-ST elevated myocardial infarction)  Status post coronary artery stent placement  Patient's Home Medications on Admission:  Current Outpatient Medications:    Accu-Chek FastClix Lancets MISC, TEST FASTING BLOOD SUGAR EVERY DAY, Disp: 102 each, Rfl: 3   aspirin 81 MG EC tablet, Take 81 mg by mouth daily., Disp: , Rfl:    atorvastatin (LIPITOR) 80 MG tablet, Take 1 tablet (80 mg total) by mouth at bedtime., Disp: 90 tablet, Rfl: 1   Blood Glucose Monitoring Suppl (ACCU-CHEK GUIDE) w/Device KIT, USE AS DIRECTED TO CHECK FASTING BLOOD SUGAR DAILY, Disp: 1 kit, Rfl: 0   clopidogrel (PLAVIX) 75 MG tablet, Take 1 tablet (75 mg total) by mouth daily., Disp: 90 tablet, Rfl: 1   cyclobenzaprine (FLEXERIL) 5 MG tablet, Take ONE tab PO BID/TID PRN, Disp: , Rfl:    furosemide (LASIX) 20 MG tablet, Take 20 mg by mouth daily., Disp: , Rfl:    glucose blood (ACCU-CHEK GUIDE) test strip, TEST FASTING BLOOD SUGAR EVERY DAY AS DIRECTED, Disp: 100 strip, Rfl: 2   Lancets Misc. (ACCU-CHEK FASTCLIX LANCET) KIT, 1 each by Does not apply route as directed., Disp: 1 kit, Rfl: 1   latanoprost (XALATAN) 0.005 % ophthalmic solution, Place 1 drop into both eyes at bedtime. , Disp: , Rfl: 3   losartan (COZAAR) 25 MG tablet, Take 1 tablet (25 mg total) by mouth daily., Disp: 90 tablet, Rfl: 0   Magnesium 250 MG TABS, Take 250 mg by mouth daily., Disp: , Rfl:    metFORMIN (GLUCOPHAGE) 500 MG tablet, TAKE 2 TABLETS TWICE DAILY WITH MEALS, Disp: 360 tablet, Rfl: 1   nitroGLYCERIN (NITROSTAT) 0.4 MG SL  tablet, Place 1 tablet (0.4 mg total) under the tongue every 5 (five) minutes as needed for chest pain., Disp: 100 tablet, Rfl: 3   pantoprazole (PROTONIX) 40 MG tablet, Take 1 tablet (40 mg total) by mouth 2 (two) times daily before a meal., Disp: 60 tablet, Rfl: 0   potassium chloride SA (KLOR-CON M) 20 MEQ tablet, Take 20 mEq by mouth daily., Disp: , Rfl:    vitamin B-12 (CYANOCOBALAMIN) 1000 MCG tablet, Take 1,000 mcg by mouth daily., Disp: , Rfl:  No current facility-administered medications for this visit.  Facility-Administered Medications Ordered in Other Visits:    ticagrelor (BRILINTA) tablet, , , PRN, End, Cristal Deer, MD, 180 mg at 07/08/22 1605  Past Medical History: Past Medical History:  Diagnosis Date   Allergy    Arthritis    Bundle branch block, right    C7 cervical fracture    CAD (coronary artery disease)    a. 06/2005 Cath: LM nl, LAD 38m, LCX nl, RCA 20ost->med rx; b. 07/2022 NSTEMI/PCI: LM 40d, LAD 50p, 40p/m, 52m (2.0x19 Onyx Frontier DES), RI 20, LCX small, mild/diff dzs, RCA 60ost, 65p, 60d. EF 25-35%.   CHF (congestive heart failure)    Chronic HFrEF (heart failure with reduced ejection fraction)    a. 10/2011 Echo: EF 65-70%; b. 07/2022 Echo: EF 35-40%, glob HK, basal wall motion best preserved. GrI DD, nl RV fxn, mild MR.  Diabetes mellitus    Diffuse cystic mastopathy    Dissection, aorta    Dr.Dew follows   Diverticulosis    Family history of malignant neoplasm of gastrointestinal tract    Glaucoma 2005   Heart murmur    History of bronchitis    History of pancreatitis 2005   Hyperlipidemia LDL goal <70    Hypertension 1982   Ischemic cardiomyopathy    a. 07/2022 Echo: EF 35-40%.   Personal history of tobacco use, presenting hazards to health    RBBB    Ulcer     Tobacco Use: Social History   Tobacco Use  Smoking Status Former   Packs/day: 1.00   Years: 15.00   Additional pack years: 0.00   Total pack years: 15.00   Types: Cigarettes   Quit  date: 08/03/2003   Years since quitting: 19.0  Smokeless Tobacco Never    Labs: Review Flowsheet  More data exists      Latest Ref Rng & Units 07/20/2021 10/22/2021 01/20/2022 07/07/2022 07/08/2022  Labs for ITP Cardiac and Pulmonary Rehab  Cholestrol 0 - 200 mg/dL - - - - 161   LDL (calc) 0 - 99 mg/dL - - - - 82   HDL-C >09 mg/dL - - - - 604   Trlycerides <150 mg/dL - - - - 62   Hemoglobin A1c 4.8 - 5.6 % 6.8  7.0  7.1  7.1  -  PH, Arterial 7.35 - 7.45 - - - - 7.373   PCO2 arterial 32 - 48 mmHg - - - - 35.3   Bicarbonate 20.0 - 28.0 mmol/L - - - - 20.6  21.7   TCO2 22 - 32 mmol/L - - - - 22  23   Acid-base deficit 0.0 - 2.0 mmol/L - - - - 4.0  4.0   O2 Saturation % - - - - 92  63      Exercise Target Goals: Exercise Program Goal: Individual exercise prescription set using results from initial 6 min walk test and THRR while considering  patient's activity barriers and safety.   Exercise Prescription Goal: Initial exercise prescription builds to 30-45 minutes a day of aerobic activity, 2-3 days per week.  Home exercise guidelines will be given to patient during program as part of exercise prescription that the participant will acknowledge.   Education: Aerobic Exercise: - Group verbal and visual presentation on the components of exercise prescription. Introduces F.I.T.T principle from ACSM for exercise prescriptions.  Reviews F.I.T.T. principles of aerobic exercise including progression. Written material given at graduation. Flowsheet Row Cardiac Rehab from 08/11/2022 in [facility] Cardiac and Pulmonary Rehab  Education need identified 07/25/22  Date 07/27/22  Educator [facility]  Instruction Review Code 1- Verbalizes Understanding       Education: Resistance Exercise: - Group verbal and visual presentation on the components of exercise prescription. Introduces F.I.T.T principle from ACSM for exercise prescriptions  Reviews F.I.T.T. principles of resistance exercise including progression. Written  material given at graduation.    Education: Exercise & Equipment Safety: - Individual verbal instruction and demonstration of equipment use and safety with use of the equipment. Flowsheet Row Cardiac Rehab from 08/11/2022 in [facility] Cardiac and Pulmonary Rehab  Date 07/18/22  Educator [facility]  Instruction Review Code 1- Verbalizes Understanding       Education: Exercise Physiology & General Exercise Guidelines: - Group verbal and written instruction with models to review the exercise physiology of the cardiovascular system and associated critical values. Provides general exercise guidelines  with specific guidelines to those with heart or lung disease.    Education: Flexibility, Balance, Mind/Body Relaxation: - Group verbal and visual presentation with interactive activity on the components of exercise prescription. Introduces F.I.T.T principle from ACSM for exercise prescriptions. Reviews F.I.T.T. principles of flexibility and balance exercise training including progression. Also discusses the mind body connection.  Reviews various relaxation techniques to help reduce and manage stress (i.e. Deep breathing, progressive muscle relaxation, and visualization). Balance handout provided to take home. Written material given at graduation.   Activity Barriers & Risk Stratification:  Activity Barriers & Cardiac Risk Stratification - 07/25/22 1434       Activity Barriers & Cardiac Risk Stratification   Activity Barriers Arthritis;Neck/Spine Problems;Back Problems;Balance Concerns;Muscular Weakness;Deconditioning;Shortness of Breath   back surgery x3, 2 cervical cage, 1 low back   Cardiac Risk Stratification Moderate             6 Minute Walk:  6 Minute Walk     Row Name 07/25/22 1433         6 Minute Walk   Phase Initial     Distance 892 feet     Walk Time 6 minutes     # of Rest Breaks 0     MPH 1.69     METS 2.63     RPE 11     Perceived Dyspnea  1     VO2 Peak 7.92     Symptoms  Yes (comment)     Comments tired, chest tightness 1/10, SOB     Resting HR 88 bpm     Resting BP 146/68     Resting Oxygen Saturation  99 %     Exercise Oxygen Saturation  during 6 min walk 94 %     Max Ex. HR 122 bpm     Max Ex. BP 184/86     2 Minute Post BP 146/74              Oxygen Initial Assessment:   Oxygen Re-Evaluation:   Oxygen Discharge (Final Oxygen Re-Evaluation):   Initial Exercise Prescription:  Initial Exercise Prescription - 07/25/22 1400       Date of Initial Exercise RX and Referring Provider   Date 07/25/22    Referring Provider End, Cristal Deer MD      Oxygen   Maintain Oxygen Saturation 88% or higher      Treadmill   MPH 1.6    Grade 0    Minutes 15    METs 2.23      REL-XR   Level 1    Speed 50    Minutes 15    METs 2      T5 Nustep   Level 1    SPM 80    Minutes 15    METs 2      Track   Laps 23    Minutes 15    METs 2.23      Prescription Details   Frequency (times per week) 3    Duration Progress to 30 minutes of continuous aerobic without signs/symptoms of physical distress      Intensity   THRR 40-80% of Max Heartrate 107-127    Ratings of Perceived Exertion 11-13    Perceived Dyspnea 0-4      Progression   Progression Continue to progress workloads to maintain intensity without signs/symptoms of physical distress.      Resistance Training   Training Prescription Yes    Weight 3 lb  Reps 10-15             Perform Capillary Blood Glucose checks as needed.  Exercise Prescription Changes:   Exercise Prescription Changes     Row Name 07/25/22 1400 08/11/22 1100           Response to Exercise   Blood Pressure (Admit) 146/68 132/60      Blood Pressure (Exercise) 184/86 172/68      Blood Pressure (Exit) 146/74 110/58      Heart Rate (Admit) 88 bpm 104 bpm      Heart Rate (Exercise) 122 bpm 140 bpm      Heart Rate (Exit) 89 bpm 98 bpm      Oxygen Saturation (Admit) 99 % --      Oxygen  Saturation (Exercise) 94 % --      Rating of Perceived Exertion (Exercise) 11 15      Perceived Dyspnea (Exercise) 1 --      Symptoms tired, chest tightness 1/10, SOB none      Comments walk test results First four days of exercise      Duration -- Continue with 30 min of aerobic exercise without signs/symptoms of physical distress.      Intensity -- THRR unchanged        Progression   Progression -- Continue to progress workloads to maintain intensity without signs/symptoms of physical distress.      Average METs -- 2.56        Resistance Training   Training Prescription -- Yes      Weight -- 3 lb      Reps -- 10-15        Interval Training   Interval Training -- No        Treadmill   MPH -- 2.7      Grade -- 0      Minutes -- 15      METs -- 3.07        REL-XR   Level -- 3      Minutes -- 15        T5 Nustep   Level -- 3      Minutes -- 15      METs -- 2.2        Track   Laps -- 26      Minutes -- 15      METs -- 2.41        Oxygen   Maintain Oxygen Saturation -- 88% or higher               Exercise Comments:   Exercise Comments     Row Name 07/27/22 1007           Exercise Comments First full day of exercise!  Patient was oriented to gym and equipment including functions, settings, policies, and procedures.  Patient's individual exercise prescription and treatment plan were reviewed.  All starting workloads were established based on the results of the 6 minute walk test done at initial orientation visit.  The plan for exercise progression was also introduced and progression will be customized based on patient's performance and goals.                Exercise Goals and Review:   Exercise Goals     Row Name 07/25/22 1437             Exercise Goals   Increase Physical Activity Yes       Intervention Provide advice, education,  support and counseling about physical activity/exercise needs.;Develop an individualized exercise prescription for  aerobic and resistive training based on initial evaluation findings, risk stratification, comorbidities and participant's personal goals.       Expected Outcomes Short Term: Attend rehab on a regular basis to increase amount of physical activity.;Long Term: Add in home exercise to make exercise part of routine and to increase amount of physical activity.;Long Term: Exercising regularly at least 3-5 days a week.       Increase Strength and Stamina Yes       Intervention Provide advice, education, support and counseling about physical activity/exercise needs.;Develop an individualized exercise prescription for aerobic and resistive training based on initial evaluation findings, risk stratification, comorbidities and participant's personal goals.       Expected Outcomes Short Term: Increase workloads from initial exercise prescription for resistance, speed, and METs.;Short Term: Perform resistance training exercises routinely during rehab and add in resistance training at home;Long Term: Improve cardiorespiratory fitness, muscular endurance and strength as measured by increased METs and functional capacity ( )       Able to understand and use rate of perceived exertion (RPE) scale Yes       Intervention Provide education and explanation on how to use RPE scale       Expected Outcomes Short Term: Able to use RPE daily in rehab to express subjective intensity level;Long Term:  Able to use RPE to guide intensity level when exercising independently       Able to understand and use Dyspnea scale Yes       Intervention Provide education and explanation on how to use Dyspnea scale       Expected Outcomes Short Term: Able to use Dyspnea scale daily in rehab to express subjective sense of shortness of breath during exertion;Long Term: Able to use Dyspnea scale to guide intensity level when exercising independently       Knowledge and understanding of Target Heart Rate Range (THRR) Yes       Intervention Provide  education and explanation of THRR including how the numbers were predicted and where they are located for reference       Expected Outcomes Short Term: Able to state/look up THRR;Short Term: Able to use daily as guideline for intensity in rehab;Long Term: Able to use THRR to govern intensity when exercising independently       Able to check pulse independently Yes       Intervention Review the importance of being able to check your own pulse for safety during independent exercise;Provide education and demonstration on how to check pulse in carotid and radial arteries.       Expected Outcomes Short Term: Able to explain why pulse checking is important during independent exercise;Long Term: Able to check pulse independently and accurately       Understanding of Exercise Prescription Yes       Intervention Provide education, explanation, and written materials on patient's individual exercise prescription       Expected Outcomes Short Term: Able to explain program exercise prescription;Long Term: Able to explain home exercise prescription to exercise independently                Exercise Goals Re-Evaluation :  Exercise Goals Re-Evaluation     Row Name 07/27/22 1007 08/11/22 1115           Exercise Goal Re-Evaluation   Exercise Goals Review Able to understand and use rate of perceived exertion (RPE) scale;Able to understand and use  Dyspnea scale;Knowledge and understanding of Target Heart Rate Range (THRR);Understanding of Exercise Prescription Increase Physical Activity;Increase Strength and Stamina;Understanding of Exercise Prescription      Comments Reviewed RPE scale, THR and program prescription with pt today.  Pt voiced understanding and was given a copy of goals to take home. Srihitha is off to a good start in the program. During her first four sessions she had an overall average MET level of 2.56 METs. She also was able to work up to level 3 on both the XR and the T5 nustep. She increased  her treadmill speed as well to 2.7 mph with no incline, and walked up to 26 laps on the track. We will continue to moniotr her progress in the program.      Expected Outcomes Short: Use RPE daily to regulate intensity. Long: Follow program prescription in THR. Short: Begin to add incline on treadmill. Long: Continue to improve strength and stamina.               Discharge Exercise Prescription (Final Exercise Prescription Changes):  Exercise Prescription Changes - 08/11/22 1100       Response to Exercise   Blood Pressure (Admit) 132/60    Blood Pressure (Exercise) 172/68    Blood Pressure (Exit) 110/58    Heart Rate (Admit) 104 bpm    Heart Rate (Exercise) 140 bpm    Heart Rate (Exit) 98 bpm    Rating of Perceived Exertion (Exercise) 15    Symptoms none    Comments First four days of exercise    Duration Continue with 30 min of aerobic exercise without signs/symptoms of physical distress.    Intensity THRR unchanged      Progression   Progression Continue to progress workloads to maintain intensity without signs/symptoms of physical distress.    Average METs 2.56      Resistance Training   Training Prescription Yes    Weight 3 lb    Reps 10-15      Interval Training   Interval Training No      Treadmill   MPH 2.7    Grade 0    Minutes 15    METs 3.07      REL-XR   Level 3    Minutes 15      T5 Nustep   Level 3    Minutes 15    METs 2.2      Track   Laps 26    Minutes 15    METs 2.41      Oxygen   Maintain Oxygen Saturation 88% or higher             Nutrition:  Target Goals: Understanding of nutrition guidelines, daily intake of sodium 1500mg , cholesterol 200mg , calories 30% from fat and 7% or less from saturated fats, daily to have 5 or more servings of fruits and vegetables.  Education: All About Nutrition: -Group instruction provided by verbal, written material, interactive activities, discussions, models, and posters to present general  guidelines for heart healthy nutrition including fat, fiber, MyPlate, the role of sodium in heart healthy nutrition, utilization of the nutrition label, and utilization of this knowledge for meal planning. Follow up email sent as well. Written material given at graduation. Flowsheet Row Cardiac Rehab from 08/11/2022 in [facility] Cardiac and Pulmonary Rehab  Education need identified 07/25/22  Date 08/11/22  Educator [facility]  Instruction Review Code 1- Verbalizes Understanding       Biometrics:  Pre Biometrics - 07/25/22 1437  Pre Biometrics   Height 5' 2.75" (1.594 m)    Weight 134 lb 14.4 oz (61.2 kg)    Waist Circumference 32 inches    Hip Circumference 35 inches    Waist to Hip Ratio 0.91 %    BMI (Calculated) 24.08    Single Leg Stand 3.6 seconds              Nutrition Therapy Plan and Nutrition Goals:  Nutrition Therapy & Goals - 07/25/22 1051       Nutrition Therapy   Diet Heart healthy, low Na, T2DM    Drug/Food Interactions Statins/Certain Fruits    Protein (specify units) 75g    Fiber 25 grams    Whole Grain Foods 3 servings    Saturated Fats 12 max. grams    Fruits and Vegetables 8 servings/day    Sodium 2 grams      Personal Nutrition Goals   Nutrition Goal ST: LT:    Comments 82 y.o F admitted to Cardiac Rehab s/p NSTEMI with stent placement. PMHx includes HTN, CAD, CHF, T2DM. Medications reviewed includes atorvastatin, metformin, pantoprazole, vit 12. Eulalie reports eating for her diabetes health for a long time and feels she is doing well with that - she would like to talk more about heart healthy eating. Discussed heart healthy eating and considerations such as sodium, types of fat, and fiber. Reviewed MyPlate and T2DM friendly eating. Romesha uses olive oil spray, eats grains like cheerios and oatmeal, she makes her own yeast bread, she grows a vegetable garden and eats a variety of non-starchy vegetables. Chayse does not eat out often and makes many foods  herslef; she enjoys stir-fry and homemade soups with lots of vegetables. She eats a variety of protein including salmon, poultry, beef (1-2x/week), and plant proteins like legumes and tofu. Krithi reports that she does not like salmon, but likes salmon cakes - suggested using a whole grain binder (she would like to use oats) and using airfryer to limit added fat.      Intervention Plan   Intervention Prescribe, educate and counsel regarding individualized specific dietary modifications aiming towards targeted core components such as weight, hypertension, lipid management, diabetes, heart failure and other comorbidities.;Nutrition handout(s) given to patient.    Expected Outcomes Short Term Goal: Understand basic principles of dietary content, such as calories, fat, sodium, cholesterol and nutrients.;Short Term Goal: A plan has been developed with personal nutrition goals set during dietitian appointment.;Long Term Goal: Adherence to prescribed nutrition plan.             Nutrition Assessments:  MEDIFICTS Score Key: ?70 Need to make dietary changes  40-70 Heart Healthy Diet ? 40 Therapeutic Level Cholesterol Diet  Flowsheet Row Cardiac Rehab from 07/25/2022 in [facility] Cardiac and Pulmonary Rehab  Picture Your Plate Total Score on Admission 72      Picture Your Plate Scores: <16 Unhealthy dietary pattern with much room for improvement. 41-50 Dietary pattern unlikely to meet recommendations for good health and room for improvement. 51-60 More healthful dietary pattern, with some room for improvement.  >60 Healthy dietary pattern, although there may be some specific behaviors that could be improved.    Nutrition Goals Re-Evaluation:   Nutrition Goals Discharge (Final Nutrition Goals Re-Evaluation):   Psychosocial: Target Goals: Acknowledge presence or absence of significant depression and/or stress, maximize coping skills, provide positive support system. Participant is able to  verbalize types and ability to use techniques and skills needed for reducing stress and depression.  Education: Stress, Anxiety, and Depression - Group verbal and visual presentation to define topics covered.  Reviews how body is impacted by stress, anxiety, and depression.  Also discusses healthy ways to reduce stress and to treat/manage anxiety and depression.  Written material given at graduation.   Education: Sleep Hygiene -Provides group verbal and written instruction about how sleep can affect your health.  Define sleep hygiene, discuss sleep cycles and impact of sleep habits. Review good sleep hygiene tips.    Initial Review & Psychosocial Screening:  Initial Psych Review & Screening - 07/18/22 1030       Initial Review   Current issues with Current Stress Concerns      Family Dynamics   Good Support System? Yes    Comments She is frustrated that she layed in the hospital for awhile when she had her heart event. Her son is here in town and friends she can look to for support. She wnats to be able to get back on her lawnmower and do yardwork.      Barriers   Psychosocial barriers to participate in program The patient should benefit from training in stress management and relaxation.;There are no identifiable barriers or psychosocial needs.      Screening Interventions   Interventions Provide feedback about the scores to participant;To provide support and resources with identified psychosocial needs;Encouraged to exercise    Expected Outcomes Short Term goal: Utilizing psychosocial counselor, staff and physician to assist with identification of specific Stressors or current issues interfering with healing process. Setting desired goal for each stressor or current issue identified.;Long Term Goal: Stressors or current issues are controlled or eliminated.;Short Term goal: Identification and review with participant of any Quality of Life or Depression concerns found by scoring the  questionnaire.;Long Term goal: The participant improves quality of Life and PHQ9 Scores as seen by post scores and/or verbalization of changes             Quality of Life Scores:   Quality of Life - 07/25/22 1438       Quality of Life   Select Quality of Life      Quality of Life Scores   Health/Function Pre 10.27 %    Socioeconomic Pre 25.94 %    Psych/Spiritual Pre 18.17 %    Family Pre 18.6 %    GLOBAL Pre 16.7 %            Scores of 19 and below usually indicate a poorer quality of life in these areas.  A difference of  2-3 points is a clinically meaningful difference.  A difference of 2-3 points in the total score of the Quality of Life Index has been associated with significant improvement in overall quality of life, self-image, physical symptoms, and general health in studies assessing change in quality of life.  PHQ-9: Review Flowsheet  More data exists      08/09/2022 08/02/2022 07/25/2022 07/12/2022 03/08/2022  Depression screen PHQ 2/9  Decreased Interest 0 1 0 0 1  Down, Depressed, Hopeless 0 0 0 0 0  PHQ - 2 Score 0 1 0 0 1  Altered sleeping 0 1 0 1 1  Tired, decreased energy 3 3 3 2 1   Change in appetite 3 0 3 0 1  Feeling bad or failure about yourself  0 0 0 0 0  Trouble concentrating 0 0 0 0 0  Moving slowly or fidgety/restless 0 0 0 0 0  Suicidal thoughts 0 0 0 0 0  PHQ-9 Score 6 5 6 3 4   Difficult doing work/chores Extremely dIfficult Not difficult at all Very difficult Not difficult at all Not difficult at all   Interpretation of Total Score  Total Score Depression Severity:  1-4 = Minimal depression, 5-9 = Mild depression, 10-14 = Moderate depression, 15-19 = Moderately severe depression, 20-27 = Severe depression   Psychosocial Evaluation and Intervention:  Psychosocial Evaluation - 07/18/22 1035       Psychosocial Evaluation & Interventions   Interventions Encouraged to exercise with the program and follow exercise prescription;Relaxation  education;Stress management education    Comments She is frustrated that she layed in the hospital for awhile when she had her heart event. Her son is here in town and friends she can look to for support. She wnats to be able to get back on her lawnmower and do yardwork.    Expected Outcomes Short: Start [facility] to help with mood. Long: Maintain a healthy mental state    Continue Psychosocial Services  Follow up required by staff             Psychosocial Re-Evaluation:  Psychosocial Re-Evaluation     Row Name 08/05/22 1001             Psychosocial Re-Evaluation   Current issues with Current Sleep Concerns;Current Stress Concerns       Comments Amador Cunas states that she does not sleep well and never has. Her heart attack is her main stressor. She wants to get back at taking care of her dog and yard work. Her shortness of breath and weakness gets to her and wants to improve.       Expected Outcomes Short: continue exercise regularly to improve mood. Long: maintain a positive outlook on her health.       Interventions Encouraged to attend Cardiac Rehabilitation for the exercise       Continue Psychosocial Services  Follow up required by staff                Psychosocial Discharge (Final Psychosocial Re-Evaluation):  Psychosocial Re-Evaluation - 08/05/22 1001       Psychosocial Re-Evaluation   Current issues with Current Sleep Concerns;Current Stress Concerns    Comments Amador Cunas states that she does not sleep well and never has. Her heart attack is her main stressor. She wants to get back at taking care of her dog and yard work. Her shortness of breath and weakness gets to her and wants to improve.    Expected Outcomes Short: continue exercise regularly to improve mood. Long: maintain a positive outlook on her health.    Interventions Encouraged to attend Cardiac Rehabilitation for the exercise    Continue Psychosocial Services  Follow up required by staff              Vocational Rehabilitation: Provide vocational rehab assistance to qualifying candidates.   Vocational Rehab Evaluation & Intervention:  Vocational Rehab - 07/25/22 1439       Initial Vocational Rehab Evaluation & Intervention   Assessment shows need for Vocational Rehabilitation No   retired            Education: Education Goals: Education classes will be provided on a variety of topics geared toward better understanding of heart health and risk factor modification. Participant will state understanding/return demonstration of topics presented as noted by education test scores.  Learning Barriers/Preferences:  Learning Barriers/Preferences - 07/18/22 1027       Learning Barriers/Preferences   Learning Barriers None  Learning Preferences None             General Cardiac Education Topics:  AED/CPR: - Group verbal and written instruction with the use of models to demonstrate the basic use of the AED with the basic ABC's of resuscitation.   Anatomy and Cardiac Procedures: - Group verbal and visual presentation and models provide information about basic cardiac anatomy and function. Reviews the testing methods done to diagnose heart disease and the outcomes of the test results. Describes the treatment choices: Medical Management, Angioplasty, or Coronary Bypass Surgery for treating various heart conditions including Myocardial Infarction, Angina, Valve Disease, and Cardiac Arrhythmias.  Written material given at graduation. Flowsheet Row Cardiac Rehab from 08/11/2022 in [facility] Cardiac and Pul[facility] Rehab  Education need identified 07/25/22       Medication Safety: - Group verbal and visual instruction to review commonly prescribed medications for heart and lung disease. Reviews the medication, class of the drug, and side effects. Includes the steps to properly store meds and maintain the prescription regimen.  Written material given at graduation.   Intimacy: -  Group verbal instruction through game format to discuss how heart and lung disease can affect sexual intimacy. Written material given at graduation.. Flowsheet Row Cardiac Rehab from 08/11/2022 in [facility] Cardiac and Pulmonary Rehab  Date 07/27/22  Educator [facility]  Instruction Review Code 1- Verbalizes Understanding       Know Your Numbers and Heart Failure: - Group verbal and visual instruction to discuss disease risk factors for cardiac and pulmonary disease and treatment options.  Reviews associated critical values for Overweight/Obesity, Hypertension, Cholesterol, and Diabetes.  Discusses basics of heart failure: signs/symptoms and treatments.  Introduces Heart Failure Zone chart for action plan for heart failure.  Written material given at graduation.   Infection Prevention: - Provides verbal and written material to individual with discussion of infection control including proper hand washing and proper equipment cleaning during exercise session. Flowsheet Row Cardiac Rehab from 08/11/2022 in [facility] Cardiac and Pulmonary Rehab  Date 07/18/22  Educator [facility]  Instruction Review Code 1- Verbalizes Understanding       Falls Prevention: - Provides verbal and written material to individual with discussion of falls prevention and safety. Flowsheet Row Cardiac Rehab from 08/11/2022 in [facility] Cardiac and Pulmonary Rehab  Date 07/18/22  Educator [facility]  Instruction Review Code 1- Verbalizes Understanding       Other: -Provides group and verbal instruction on various topics (see comments)   Knowledge Questionnaire Score:  Knowledge Questionnaire Score - 07/25/22 1439       Knowledge Questionnaire Score   Pre Score 23/26             Core Components/Risk Factors/Patient Goals at Admission:  Personal Goals and Risk Factors at Admission - 07/25/22 1439       Core Components/Risk Factors/Patient Goals on Admission    Weight Management Yes;Weight Maintenance    Intervention Weight Management:  Develop a combined nutrition and exercise program designed to reach desired caloric intake, while maintaining appropriate intake of nutrient and fiber, sodium and fats, and appropriate energy expenditure required for the weight goal.;Weight Management: Provide education and appropriate resources to help participant work on and attain dietary goals.;Weight Management/Obesity: Establish reasonable short term and long term weight goals.    Admit Weight 134 lb 14.4 oz (61.2 kg)    Goal Weight: Short Term 134 lb (60.8 kg)    Goal Weight: Long Term 134 lb (60.8 kg)    Expected Outcomes  Short Term: Continue to assess and modify interventions until short term weight is achieved;Long Term: Adherence to nutrition and physical activity/exercise program aimed toward attainment of established weight goal;Weight Maintenance: Understanding of the daily nutrition guidelines, which includes 25-35% calories from fat, 7% or less cal from saturated fats, less than  cholesterol, less than 1.5gm of sodium, & 5 or more servings of fruits and vegetables daily    Diabetes Yes    Intervention Provide education about signs/symptoms and action to take for hypo/hyperglycemia.;Provide education about proper nutrition, including hydration, and aerobic/resistive exercise prescription along with prescribed medications to achieve blood glucose in normal ranges: Fasting glucose 65-99 mg/dL    Expected Outcomes Short Term: Participant verbalizes understanding of the signs/symptoms and immediate care of hyper/hypoglycemia, proper foot care and importance of medication, aerobic/resistive exercise and nutrition plan for blood glucose control.;Long Term: Attainment of HbA1C < 7%.    Heart Failure Yes    Intervention Provide a combined exercise and nutrition program that is supplemented with education, support and counseling about heart failure. Directed toward relieving symptoms such as shortness of breath, decreased exercise tolerance, and  extremity edema.    Expected Outcomes Improve functional capacity of life;Short term: Attendance in program 2-3 days a week with increased exercise capacity. Reported lower sodium intake. Reported increased fruit and vegetable intake. Reports medication compliance.;Short term: Daily weights obtained and reported for increase. Utilizing diuretic protocols set by physician.;Long term: Adoption of self-care skills and reduction of barriers for early signs and symptoms recognition and intervention leading to self-care maintenance.    Hypertension Yes    Intervention Provide education on lifestyle modifcations including regular physical activity/exercise, weight management, moderate sodium restriction and increased consumption of fresh fruit, vegetables, and low fat dairy, alcohol moderation, and smoking cessation.;Monitor prescription use compliance.    Expected Outcomes Short Term: Continued assessment and intervention until BP is < 140/62mm HG in hypertensive participants. < 130/69mm HG in hypertensive participants with diabetes, heart failure or chronic kidney disease.;Long Term: Maintenance of blood pressure at goal levels.    Lipids Yes    Intervention Provide education and support for participant on nutrition & aerobic/resistive exercise along with prescribed medications to achieve LDL 70mg , HDL >40mg .    Expected Outcomes Short Term: Participant states understanding of desired cholesterol values and is compliant with medications prescribed. Participant is following exercise prescription and nutrition guidelines.;Long Term: Cholesterol controlled with medications as prescribed, with individualized exercise RX and with personalized nutrition plan. Value goals: LDL < , HDL > 40 mg.             Education:Diabetes - Individual verbal and written instruction to review signs/symptoms of diabetes, desired ranges of glucose level fasting, after meals and with exercise. Acknowledge that pre and post  exercise glucose checks will be done for 3 sessions at entry of program. Flowsheet Row Cardiac Rehab from 08/11/2022 in [facility] Cardiac and Pulmonary Rehab  Date 07/18/22  Educator [facility]  Instruction Review Code 1- Verbalizes Understanding       Core Components/Risk Factors/Patient Goals Review:    Core Components/Risk Factors/Patient Goals at Discharge (Final Review):    ITP Comments:  ITP Comments     Row Name 07/18/22 1026 07/25/22 1424 07/27/22 1006 08/17/22 0908     ITP Comments Virtual Visit completed. Patient informed on EP and RD appointment and 6 Minute walk test. Patient also informed of patient health questionnaires on My Chart. Patient Verbalizes understanding. Visit diagnosis can be found in [facility] 07/07/2022. Completed  and gym orientation. Initial ITP created and sent for review to Dr. Ahmed Prima covering for Dr. Bethann Punches, Medical Director. First full day of exercise!  Patient was oriented to gym and equipment including functions, settings, policies, and procedures.  Patient's individual exercise prescription and treatment plan were reviewed.  All starting workloads were established based on the results of the 6 minute walk test done at initial orientation visit.  The plan for exercise progression was also introduced and progression will be customized based on patient's performance and goals. 30 day review completed. ITP sent to Dr. Bethann Punches, Medical Director of Cardiac Rehab. Continue with ITP unless changes are made by physician.             Comments: 30 day review

## 2022-08-18 ENCOUNTER — Encounter: Payer: Medicare HMO | Admitting: *Deleted

## 2022-08-18 DIAGNOSIS — I252 Old myocardial infarction: Secondary | ICD-10-CM | POA: Diagnosis not present

## 2022-08-18 DIAGNOSIS — I214 Non-ST elevation (NSTEMI) myocardial infarction: Secondary | ICD-10-CM

## 2022-08-18 DIAGNOSIS — Z955 Presence of coronary angioplasty implant and graft: Secondary | ICD-10-CM

## 2022-08-18 DIAGNOSIS — Z48812 Encounter for surgical aftercare following surgery on the circulatory system: Secondary | ICD-10-CM | POA: Diagnosis not present

## 2022-08-18 NOTE — Progress Notes (Signed)
Daily Session Note  Patient Details  Name: ORENA CAVAZOS MRN: [id number] Date of Birth: 26-Jan-1941 Referring Provider:   Flowsheet Row Cardiac Rehab from 07/25/2022 in [facility] Cardiac and Pulmonary Rehab  Referring Provider End, Cristal Deer MD       Encounter Date: 08/18/2022  Check In:  Session Check In - 08/18/22 0730       Check-In   Supervising physician immediately available to respond to emergencies See telemetry face sheet for immediately available ER MD    Location [facility]-Cardiac & Pulmonary Rehab    Staff Present Lanny Hurst, RN, ADN;Jessica Juanetta Gosling, MA, RCEP, CCRP, CCET;Joseph [place], [place]    Virtual Visit No    Medication changes reported     No    Fall or balance concerns reported    No    Warm-up and Cool-down Performed on first and last piece of equipment    Resistance Training Performed Yes    VAD Patient? No    PAD/SET Patient? No      Pain Assessment   Currently in Pain? No/denies                Social History   Tobacco Use  Smoking Status Former   Packs/day: 1.00   Years: 15.00   Additional pack years: 0.00   Total pack years: 15.00   Types: Cigarettes   Quit date: 08/03/2003   Years since quitting: 19.0  Smokeless Tobacco Never    Goals Met:  Independence with exercise equipment Exercise tolerated well No report of concerns or symptoms today Strength training completed today  Goals Unmet:  Not Applicable  Comments: Pt able to follow exercise prescription today without complaint.  Will continue to monitor for progression.  Reviewed home exercise with pt today.  Pt plans to walk and use her treadmill at home for exercise.  Reviewed THR, pulse, RPE, sign and symptoms, pulse oximetery and when to call 911 or MD.  Also discussed weather considerations and indoor options.  Pt voiced understanding.   Dr. Bethann Punches is Medical Director for [facility] Cardiac Rehabilitation.  Dr. Vida Rigger is Medical Director for [facility] Pulmonary  Rehabilitation.

## 2022-08-23 ENCOUNTER — Telehealth: Payer: Self-pay

## 2022-08-23 ENCOUNTER — Encounter: Payer: Medicare HMO | Admitting: *Deleted

## 2022-08-23 NOTE — Telephone Encounter (Signed)
Fax received from [facility] with medication changes:   Increase Lasix from 20 mg once daily to 20 mg  in the AM and 10 mg in the PM    Increase Potassium Chloride form 20 mEq once a day to 20 mEq in the AM and 10 mEq in the PM  Spoke with pt over the phone to verify changes. Pt stated started medication regimen as advised by [facility] on 08/24/22.  Will route to Clarisa Kindred, FNP as Lorain Childes.

## 2022-08-25 ENCOUNTER — Encounter: Payer: Medicare HMO | Admitting: *Deleted

## 2022-08-25 DIAGNOSIS — I252 Old myocardial infarction: Secondary | ICD-10-CM | POA: Diagnosis not present

## 2022-08-25 DIAGNOSIS — Z955 Presence of coronary angioplasty implant and graft: Secondary | ICD-10-CM | POA: Diagnosis not present

## 2022-08-25 DIAGNOSIS — I214 Non-ST elevation (NSTEMI) myocardial infarction: Secondary | ICD-10-CM

## 2022-08-25 DIAGNOSIS — Z48812 Encounter for surgical aftercare following surgery on the circulatory system: Secondary | ICD-10-CM | POA: Diagnosis not present

## 2022-08-25 NOTE — Progress Notes (Signed)
Daily Session Note  Patient Details  Name: Jessica Wheeler MRN: [id number] Date of Birth: 1940/05/22 Referring Provider:   Flowsheet Row Cardiac Rehab from 07/25/2022 in [facility] Cardiac and Pulmonary Rehab  Referring Provider End, Cristal Deer MD       Encounter Date: 08/25/2022  Check In:  Session Check In - 08/25/22 0730       Check-In   Supervising physician immediately available to respond to emergencies See telemetry face sheet for immediately available ER MD    Location [facility]-Cardiac & Pulmonary Rehab    Staff Present Lanny Hurst, RN, ADN;Jessica Juanetta Gosling, MA, RCEP, CCRP, CCET;Betsy Coder, PhD, RN, CNS, CEN    Virtual Visit No    Medication changes reported     No    Fall or balance concerns reported    No    Warm-up and Cool-down Performed on first and last piece of equipment    Resistance Training Performed Yes    VAD Patient? No    PAD/SET Patient? No      Pain Assessment   Currently in Pain? No/denies                Social History   Tobacco Use  Smoking Status Former   Packs/day: 1.00   Years: 15.00   Additional pack years: 0.00   Total pack years: 15.00   Types: Cigarettes   Quit date: 08/03/2003   Years since quitting: 19.0  Smokeless Tobacco Never    Goals Met:  Independence with exercise equipment Exercise tolerated well No report of concerns or symptoms today Strength training completed today  Goals Unmet:  Not Applicable  Comments: Pt able to follow exercise prescription today without complaint.  Will continue to monitor for progression.    Dr. Bethann Punches is Medical Director for [facility] Cardiac Rehabilitation.  Dr. Vida Rigger is Medical Director for [facility] Pulmonary Rehabilitation.

## 2022-08-29 ENCOUNTER — Other Ambulatory Visit (HOSPITAL_COMMUNITY): Payer: Self-pay

## 2022-08-30 ENCOUNTER — Telehealth: Payer: Self-pay

## 2022-08-30 ENCOUNTER — Encounter: Payer: Medicare HMO | Admitting: *Deleted

## 2022-08-30 DIAGNOSIS — I252 Old myocardial infarction: Secondary | ICD-10-CM | POA: Diagnosis not present

## 2022-08-30 DIAGNOSIS — Z955 Presence of coronary angioplasty implant and graft: Secondary | ICD-10-CM

## 2022-08-30 DIAGNOSIS — I214 Non-ST elevation (NSTEMI) myocardial infarction: Secondary | ICD-10-CM

## 2022-08-30 DIAGNOSIS — Z48812 Encounter for surgical aftercare following surgery on the circulatory system: Secondary | ICD-10-CM | POA: Diagnosis not present

## 2022-08-30 NOTE — Telephone Encounter (Signed)
Fax received from [facility] regarding Medication Changes:    Start Corlanor 5mg /tab: Take 1/2 tablet twice daily with food.  Will route to Jessica Wheeler for [place].

## 2022-08-30 NOTE — Progress Notes (Signed)
Daily Session Note  Patient Details  Name: Jessica Wheeler MRN: [id number] Date of Birth: Nov 20, 1940 Referring Provider:   Flowsheet Row Cardiac Rehab from 07/25/2022 in [facility] Cardiac and Pulmonary Rehab  Referring Provider End, Cristal Deer MD       Encounter Date: 08/30/2022  Check In:  Session Check In - 08/30/22 0809       Check-In   Supervising physician immediately available to respond to emergencies See telemetry face sheet for immediately available ER MD    Location [facility]-Cardiac & Pulmonary Rehab    Staff Present Cora Collum, RN, BSN, CCRP;Jessica [place], MA, RCEP, CCRP, Zackery Barefoot, MS, ACSM CEP, Exercise Physiologist    Virtual Visit No    Medication changes reported     Yes  changes to lasix doses as needed   Fall or balance concerns reported    No    Warm-up and Cool-down Performed on first and last piece of equipment    Resistance Training Performed Yes    VAD Patient? No    PAD/SET Patient? No      Pain Assessment   Currently in Pain? No/denies                Social History   Tobacco Use  Smoking Status Former   Packs/day: 1.00   Years: 15.00   Additional pack years: 0.00   Total pack years: 15.00   Types: Cigarettes   Quit date: 08/03/2003   Years since quitting: 19.0  Smokeless Tobacco Never    Goals Met:  Independence with exercise equipment Exercise tolerated well No report of concerns or symptoms today  Goals Unmet:  Not Applicable  Comments: Pt able to follow exercise prescription today without complaint.  Will continue to monitor for progression.    Dr. Bethann Punches is Medical Director for [facility] Cardiac Rehabilitation.  Dr. Vida Rigger is Medical Director for [facility] Pulmonary Rehabilitation.

## 2022-09-01 ENCOUNTER — Encounter: Payer: Medicare HMO | Attending: Internal Medicine | Admitting: *Deleted

## 2022-09-01 VITALS — Ht 62.75 in | Wt 129.8 lb

## 2022-09-01 DIAGNOSIS — Z955 Presence of coronary angioplasty implant and graft: Secondary | ICD-10-CM | POA: Diagnosis not present

## 2022-09-01 DIAGNOSIS — I214 Non-ST elevation (NSTEMI) myocardial infarction: Secondary | ICD-10-CM | POA: Diagnosis not present

## 2022-09-01 NOTE — Patient Instructions (Signed)
Discharge Patient Instructions  Patient Details  Name: Jessica Wheeler MRN: [id number] Date of Birth: [birth year] Referring Provider:  Erasmo Downer, MD   Number of Visits: 20  Reason for Discharge:  Patient reached a stable level of exercise. Patient independent in their exercise. Patient has met program and personal goals. Early Exit:  Personal  Smoking History:  Social History   Tobacco Use  Smoking Status Former   Packs/day: 1.00   Years: 15.00   Additional pack years: 0.00   Total pack years: 15.00   Types: Cigarettes   Quit date: 08/03/2003   Years since quitting: 19.0  Smokeless Tobacco Never    Diagnosis:  NSTEMI (non-ST elevated myocardial infarction) (HCC)  Status post coronary artery stent placement  Initial Exercise Prescription:  Initial Exercise Prescription - 07/25/22 1400       Date of Initial Exercise RX and Referring Provider   Date 07/25/22    Referring Provider End, Cristal Deer MD      Oxygen   Maintain Oxygen Saturation 88% or higher      Treadmill   MPH 1.6    Grade 0    Minutes 15    METs 2.23      REL-XR   Level 1    Speed 50    Minutes 15    METs 2      T5 Nustep   Level 1    SPM 80    Minutes 15    METs 2      Track   Laps 23    Minutes 15    METs 2.23      Prescription Details   Frequency (times per week) 3    Duration Progress to 30 minutes of continuous aerobic without signs/symptoms of physical distress      Intensity   THRR 40-80% of Max Heartrate 107-127    Ratings of Perceived Exertion 11-13    Perceived Dyspnea 0-4      Progression   Progression Continue to progress workloads to maintain intensity without signs/symptoms of physical distress.      Resistance Training   Training Prescription Yes    Weight 3 lb    Reps 10-15             Discharge Exercise Prescription (Final Exercise Prescription Changes):  Exercise Prescription Changes - 08/22/22 1600       Response to Exercise   Blood  Pressure (Admit) 124/72    Blood Pressure (Exercise) 148/62    Blood Pressure (Exit) 106/60    Heart Rate (Admit) 88 bpm    Heart Rate (Exercise) 128 bpm    Heart Rate (Exit) 101 bpm    Rating of Perceived Exertion (Exercise) 13    Symptoms none    Duration Continue with 30 min of aerobic exercise without signs/symptoms of physical distress.    Intensity THRR unchanged      Progression   Progression Continue to progress workloads to maintain intensity without signs/symptoms of physical distress.    Average METs 3.91      Resistance Training   Training Prescription Yes    Weight 3 lb    Reps 10-15      Interval Training   Interval Training No      Treadmill   MPH 2.5    Grade 1    Minutes 15    METs 3.26      NuStep   Level 4    Minutes 15    METs  2.9      REL-XR   Level 4    Minutes 15    METs 5.2      Home Exercise Plan   Plans to continue exercise at Home (comment)   walking, treadmill, staff videos   Frequency Add 3 additional days to program exercise sessions.    Initial Home Exercises Provided 08/18/22      Oxygen   Maintain Oxygen Saturation 88% or higher             Functional Capacity:  6 Minute Walk     Row Name 07/25/22 1433 09/01/22 0827       6 Minute Walk   Phase Initial Discharge    Distance 892 feet 1510 feet    Distance % Change -- 69.2 %    Distance Feet Change -- 618 ft    Walk Time 6 minutes 6 minutes    # of Rest Breaks 0 0    MPH 1.69 2.85    METS 2.63 3.29    RPE 11 13    Perceived Dyspnea  1 0    VO2 Peak 7.92 11.53    Symptoms Yes (comment) No    Comments tired, chest tightness 1/10, SOB --    Resting HR 88 bpm 90 bpm    Resting BP 146/68 114/54    Resting Oxygen Saturation  99 % 96 %    Exercise Oxygen Saturation  during 6 min walk 94 % 95 %    Max Ex. HR 122 bpm 131 bpm    Max Ex. BP 184/86 162/56    2 Minute Post BP 146/74 --               Nutrition & Weight - Outcomes:  Pre Biometrics - 07/25/22 1437        Pre Biometrics   Height 5' 2.75" (1.594 m)    Weight 134 lb 14.4 oz (61.2 kg)    Waist Circumference 32 inches    Hip Circumference 35 inches    Waist to Hip Ratio 0.91 %    BMI (Calculated) 24.08    Single Leg Stand 3.6 seconds             Post Biometrics - 09/01/22 0830        Post  Biometrics   Height 5' 2.75" (1.594 m)    Weight 129 lb 12.8 oz (58.9 kg)    Waist Circumference 34 inches    Hip Circumference 36 inches    Waist to Hip Ratio 0.94 %    BMI (Calculated) 23.17    Single Leg Stand 6.1 seconds             Nutrition:  Nutrition Therapy & Goals - 07/25/22 1051       Nutrition Therapy   Diet Heart healthy, low Na, T2DM    Drug/Food Interactions Statins/Certain Fruits    Protein (specify units) 75g    Fiber 25 grams    Whole Grain Foods 3 servings    Saturated Fats 12 max. grams    Fruits and Vegetables 8 servings/day    Sodium 2 grams      Personal Nutrition Goals   Nutrition Goal ST: LT:    Comments 82 y.o F admitted to Cardiac Rehab s/p NSTEMI with stent placement. PMHx includes HTN, CAD, CHF, T2DM. Medications reviewed includes atorvastatin, metformin, pantoprazole, vit 12. Jisselle reports eating for her diabetes health for a long time and feels she is doing well with  that - she would like to talk more about heart healthy eating. Discussed heart healthy eating and considerations such as sodium, types of fat, and fiber. Reviewed MyPlate and T2DM friendly eating. Siedah uses olive oil spray, eats grains like cheerios and oatmeal, she makes her own yeast bread, she grows a vegetable garden and eats a variety of non-starchy vegetables. Hedwig does not eat out often and makes many foods herslef; she enjoys stir-fry and homemade soups with lots of vegetables. She eats a variety of protein including salmon, poultry, beef (1-2x/week), and plant proteins like legumes and tofu. Razia reports that she does not like salmon, but likes salmon cakes -  suggested using a whole grain binder (she would like to use oats) and using airfryer to limit added fat.      Intervention Plan   Intervention Prescribe, educate and counsel regarding individualized specific dietary modifications aiming towards targeted core components such as weight, hypertension, lipid management, diabetes, heart failure and other comorbidities.;Nutrition handout(s) given to patient.    Expected Outcomes Short Term Goal: Understand basic principles of dietary content, such as calories, fat, sodium, cholesterol and nutrients.;Short Term Goal: A plan has been developed with personal nutrition goals set during dietitian appointment.;Long Term Goal: Adherence to prescribed nutrition plan.              Goals reviewed with patient; copy given to patient.

## 2022-09-01 NOTE — Progress Notes (Signed)
Daily Session Note  Patient Details  Name: Jessica Wheeler MRN: [id number] Date of Birth: 1941-04-17 Referring Provider:   Flowsheet Row Cardiac Rehab from 07/25/2022 in [facility]  Referring Provider End, Cristal Deer MD       Encounter Date: 09/01/2022  Check In:  Session Check In - 09/01/22 0730       Check-In   Supervising physician immediately available to respond to emergencies See telemetry face sheet for immediately available ER MD    Location [facility]Cardiac & Pul[facility]    Staff Present Cora Collum, RN, BSN, Osa Craver, MS, ACSM CEP, Exercise Physiologist;Laureen Manson Passey, BS, RRT, CPFT    Virtual Visit No    Medication changes reported     Yes    Comments Lasix back to 30 mg    Fall or balance concerns reported    No    Warm-up and Cool-down Performed on first and last piece of equipment    Resistance Training Performed Yes    VAD Patient? No    PAD/SET Patient? No      Pain Assessment   Currently in Pain? No/denies                Social History   Tobacco Use  Smoking Status Former   Packs/day: 1.00   Years: 15.00   Additional pack years: 0.00   Total pack years: 15.00   Types: Cigarettes   Quit date: 08/03/2003   Years since quitting: 19.0  Smokeless Tobacco Never    Goals Met:  Independence with exercise equipment Exercise tolerated well No report of concerns or symptoms today  Goals Unmet:  Not Applicable  Comments: Pt able to follow exercise prescription today without complaint.  Will continue to monitor for progression.    Dr. Bethann Punches is Medical Director for [facility].  Dr. Vida Rigger is Medical Director for [facility].

## 2022-09-05 DIAGNOSIS — I509 Heart failure, unspecified: Secondary | ICD-10-CM | POA: Diagnosis not present

## 2022-09-05 DIAGNOSIS — I5020 Unspecified systolic (congestive) heart failure: Secondary | ICD-10-CM | POA: Diagnosis not present

## 2022-09-05 DIAGNOSIS — R252 Cramp and spasm: Secondary | ICD-10-CM | POA: Diagnosis not present

## 2022-09-06 LAB — LAB REPORT - SCANNED: EGFR: 54

## 2022-09-12 ENCOUNTER — Other Ambulatory Visit: Payer: Self-pay | Admitting: Family Medicine

## 2022-09-12 NOTE — Telephone Encounter (Signed)
Medication Refill - Medication:  furosemide (LASIX) 20 MG tablet potassium chloride SA (KLOR-CON M) 20 MEQ tablet    Has the patient contacted their pharmacy? No. (Agent: If no, request that the patient contact the pharmacy for the refill. If patient does not wish to contact the pharmacy document the reason why and proceed with request.) (Agent: If yes, when and what did the pharmacy advise?)  Preferred Pharmacy (with phone number or street name):  [facility] - [place], [place] - [address] Phone: [phone]  Fax: [phone]     Has the patient been seen for an appointment in the last year OR does the patient have an upcoming appointment? Yes.    Agent: Please be advised that RX refills may take up to 3 business days. We ask that you follow-up with your pharmacy.

## 2022-09-13 ENCOUNTER — Other Ambulatory Visit: Payer: Self-pay

## 2022-09-13 ENCOUNTER — Other Ambulatory Visit: Payer: Self-pay | Admitting: Family

## 2022-09-13 ENCOUNTER — Telehealth: Payer: Self-pay

## 2022-09-13 ENCOUNTER — Telehealth: Payer: Self-pay | Admitting: Family Medicine

## 2022-09-13 MED ORDER — PANTOPRAZOLE SODIUM 40 MG PO TBEC: 40.0000 mg | DELAYED_RELEASE_TABLET | Freq: Two times a day (BID) | ORAL | 0 refills | Status: DC

## 2022-09-13 MED ORDER — FUROSEMIDE 20 MG PO TABS: 20.0000 mg | ORAL_TABLET | Freq: Two times a day (BID) | ORAL | 3 refills | Status: DC

## 2022-09-13 MED ORDER — POTASSIUM CHLORIDE CRYS ER 20 MEQ PO TBCR: 20.0000 meq | EXTENDED_RELEASE_TABLET | Freq: Every day | ORAL | 1 refills | Status: DC

## 2022-09-13 MED ORDER — POTASSIUM CHLORIDE CRYS ER 20 MEQ PO TBCR: 20.0000 meq | EXTENDED_RELEASE_TABLET | Freq: Two times a day (BID) | ORAL | 1 refills | Status: DC

## 2022-09-13 MED ORDER — FUROSEMIDE 20 MG PO TABS: 20.0000 mg | ORAL_TABLET | Freq: Every day | ORAL | 3 refills | Status: DC

## 2022-09-13 NOTE — Telephone Encounter (Signed)
Fax received from [facility] medication changes were made: Increase Lasix to 20mg  BID with KCl BID.  Will route to [place] for FYI.

## 2022-09-13 NOTE — Telephone Encounter (Signed)
[facility] pharmacy faxed refill request for the following medications:    pantoprazole (PROTONIX) 40 MG tablet   Please advise

## 2022-09-13 NOTE — Telephone Encounter (Signed)
Called patient to about about cardiac rehab as she stated she wanted to graduate early. Patient was out last week. Patient stated she would like to be discharged at this time due to having a high co-pay and plans on bringing her paperwork in today.

## 2022-09-13 NOTE — Telephone Encounter (Signed)
Requested medication (s) are due for refill today: yes  Requested medication (s) are on the active medication list: yes  Last refill:  08/09/22  Future visit scheduled: yes  Notes to clinic:  Unable to refill per protocol, last refill by another provider. Historical provider, routing for approval.     Requested Prescriptions  Pending Prescriptions Disp Refills   potassium chloride SA (KLOR-CON M) 20 MEQ tablet      Sig: Take 1 tablet (20 mEq total) by mouth daily.     Endocrinology:  Minerals - Potassium Supplementation Passed - 09/12/2022  4:19 PM      Passed - K in normal range and within 360 days    Potassium  Date Value Ref Range Status  08/09/2022 3.9 3.5 - 5.2 mmol/L Final  10/01/2011 3.3 (L) 3.5 - 5.1 mmol/L Final         Passed - Cr in normal range and within 360 days    Creatinine  Date Value Ref Range Status  10/01/2011 0.72 0.60 - 1.30 mg/dL Final   Creatinine, Ser  Date Value Ref Range Status  08/09/2022 0.83 0.57 - 1.00 mg/dL Final         Passed - Valid encounter within last 12 months    Recent Outpatient Visits           1 month ago Encounter for annual wellness visit (AWV) in Medicare patient   [place] [facility], Marzella Schlein, MD   1 month ago Gastrointestinal hemorrhage with melena   [place] [facility] Erasmo Downer, MD   1 month ago Left lower quadrant abdominal pain   [place] [facility] [place], [place], PA-C   2 months ago NSTEMI (non-ST elevated myocardial infarction) [facility])   [place] [facility] [place] Flock, Marzella Schlein, MD   3 months ago Right arm pain   [place] [facility] Alfredia Ferguson, PA-C       Future Appointments             In 1 month Charlsie Quest, NP  [facility] [place]   In 1 month [place], Marzella Schlein, MD [facility], [facility]             furosemide (LASIX) 20 MG tablet 30  tablet     Sig: Take 1 tablet (20 mg total) by mouth daily.     Cardiovascular:  Diuretics - Loop Failed - 09/12/2022  4:19 PM      Failed - Cl in normal range and within 180 days    Chloride  Date Value Ref Range Status  08/09/2022 95 (L) 96 - 106 mmol/L Final  10/01/2011 91 (L) 98 - 107 mmol/L Final         Passed - K in normal range and within 180 days    Potassium  Date Value Ref Range Status  08/09/2022 3.9 3.5 - 5.2 mmol/L Final  10/01/2011 3.3 (L) 3.5 - 5.1 mmol/L Final         Passed - Ca in normal range and within 180 days    Calcium  Date Value Ref Range Status  08/09/2022 9.5 8.7 - 10.3 mg/dL Final   Calcium, Total  Date Value Ref Range Status  10/01/2011 9.2 8.5 - 10.1 mg/dL Final   Calcium, Ion  Date Value Ref Range Status  07/08/2022 1.10 (L) 1.15 - 1.40 mmol/L Final         Passed - Na  in normal range and within 180 days    Sodium  Date Value Ref Range Status  08/09/2022 134 134 - 144 mmol/L Final  10/01/2011 129 (L) 136 - 145 mmol/L Final         Passed - Cr in normal range and within 180 days    Creatinine  Date Value Ref Range Status  10/01/2011 0.72 0.60 - 1.30 mg/dL Final   Creatinine, Ser  Date Value Ref Range Status  08/09/2022 0.83 0.57 - 1.00 mg/dL Final         Passed - Mg Level in normal range and within 180 days    Magnesium  Date Value Ref Range Status  07/10/2022 2.0 1.7 - 2.4 mg/dL Final    Comment:    Performed at [facility], [address], [place], [place] [address]         Passed - Last BP in normal range    BP Readings from Last 1 Encounters:  08/12/22 (!) 138/54         Passed - Valid encounter within last 6 months    Recent Outpatient Visits           1 month ago Encounter for annual wellness visit (AWV) in Medicare patient   [facility] [facility] [place], Marzella Schlein, MD   1 month ago Gastrointestinal hemorrhage with melena   [place] [facility] Erasmo Downer, MD   1 month ago Left lower quadrant abdominal pain   [place] [facility] [place], [place], PA-C   2 months ago NSTEMI (non-ST elevated myocardial infarction) [facility])   [facility] Bery[place] Flock, Marzella Schlein, MD   3 months ago Right arm pain   [place] [facility] Alfredia Ferguson, PA-C       Future Appointments             In 1 month [place], Lavonna Rua, NP [place] [facility] [place]   In 1 month Bacigal[place], Marzella Schlein, MD [facility], [facility]

## 2022-09-14 ENCOUNTER — Encounter: Payer: Self-pay | Admitting: *Deleted

## 2022-09-14 DIAGNOSIS — I214 Non-ST elevation (NSTEMI) myocardial infarction: Secondary | ICD-10-CM

## 2022-09-14 DIAGNOSIS — Z955 Presence of coronary angioplasty implant and graft: Secondary | ICD-10-CM

## 2022-09-14 NOTE — Progress Notes (Addendum)
Cardiac Individual Treatment Plan  Patient Details  Name: KARLISHA MATHENA MRN: [id number] Date of Birth: 01-Apr-1941 Referring Provider:   Flowsheet Row Cardiac Rehab from 07/25/2022 in [facility] Cardiac and Pulmonary Rehab  Referring Provider End, Cristal Deer MD       Initial Encounter Date:  Flowsheet Row Cardiac Rehab from 07/25/2022 in [facility] Cardiac and Pulmonary Rehab  Date 07/25/22       Visit Diagnosis: NSTEMI (non-ST elevated myocardial infarction) [facility])  Status post coronary artery stent placement  Patient's Home Medications on Admission:  Current Outpatient Medications:    Accu-Chek FastClix Lancets MISC, TEST FASTING BLOOD SUGAR EVERY DAY, Disp: 102 each, Rfl: 3   aspirin 81 MG EC tablet, Take 81 mg by mouth daily., Disp: , Rfl:    atorvastatin (LIPITOR) 80 MG tablet, Take 1 tablet (80 mg total) by mouth at bedtime., Disp: 90 tablet, Rfl: 1   Blood Glucose Monitoring Suppl (ACCU-CHEK GUIDE) w/Device KIT, USE AS DIRECTED TO CHECK FASTING BLOOD SUGAR DAILY, Disp: 1 kit, Rfl: 0   clopidogrel (PLAVIX) 75 MG tablet, Take 1 tablet (75 mg total) by mouth daily., Disp: 90 tablet, Rfl: 1   cyclobenzaprine (FLEXERIL) 5 MG tablet, Take ONE tab PO BID/TID PRN, Disp: , Rfl:    furosemide (LASIX) 20 MG tablet, Take 1 tablet (20 mg total) by mouth 2 (two) times daily., Disp: 30 tablet, Rfl: 3   glucose blood (ACCU-CHEK GUIDE) test strip, TEST FASTING BLOOD SUGAR EVERY DAY AS DIRECTED, Disp: 100 strip, Rfl: 2   ivabradine (CORLANOR) 5 MG TABS tablet, Take 2.5 mg by mouth 2 (two) times daily with a meal., Disp: , Rfl:    Lancets Misc. (ACCU-CHEK FASTCLIX LANCET) KIT, 1 each by Does not apply route as directed., Disp: 1 kit, Rfl: 1   latanoprost (XALATAN) 0.005 % ophthalmic solution, Place 1 drop into both eyes at bedtime. , Disp: , Rfl: 3   losartan (COZAAR) 25 MG tablet, Take 1 tablet (25 mg total) by mouth daily., Disp: 90 tablet, Rfl: 0   Magnesium 250 MG TABS, Take 250 mg by mouth daily.,  Disp: , Rfl:    metFORMIN (GLUCOPHAGE) 500 MG tablet, TAKE 2 TABLETS TWICE DAILY WITH MEALS, Disp: 360 tablet, Rfl: 1   nitroGLYCERIN (NITROSTAT) 0.4 MG SL tablet, Place 1 tablet (0.4 mg total) under the tongue every 5 (five) minutes as needed for chest pain., Disp: 100 tablet, Rfl: 3   pantoprazole (PROTONIX) 40 MG tablet, Take 1 tablet (40 mg total) by mouth 2 (two) times daily before a meal., Disp: 60 tablet, Rfl: 0   potassium chloride SA (KLOR-CON M20) 20 MEQ tablet, TAKE 1 TABLET BY MOUTH TWICE DAILY. TAKE 1 TABLET DAILY, Disp: 90 tablet, Rfl: 3   vitamin B-12 (CYANOCOBALAMIN) 1000 MCG tablet, Take 1,000 mcg by mouth daily., Disp: , Rfl:  No current facility-administered medications for this visit.  Facility-Administered Medications Ordered in Other Visits:    ticagrelor (BRILINTA) tablet, , , PRN, End, Cristal Deer, MD, 180 mg at 07/08/22 1605  Past Medical History: Past Medical History:  Diagnosis Date   Allergy    Arthritis    Bundle branch block, right    C7 cervical fracture (HCC)    CAD (coronary artery disease)    a. 06/2005 Cath: LM nl, LAD 24m, LCX nl, RCA 20ost->med rx; b. 07/2022 NSTEMI/PCI: LM 40d, LAD 50p, 40p/m, 69m (2.0x19 Onyx Frontier DES), RI 20, LCX small, mild/diff dzs, RCA 60ost, 65p, 60d. EF 25-35%.   CHF (congestive heart  failure) (HCC)    Chronic HFrEF (heart failure with reduced ejection fraction) (HCC)    a. 10/2011 Echo: EF 65-70%; b. 07/2022 Echo: EF 35-40%, glob HK, basal wall motion best preserved. GrI DD, nl RV fxn, mild MR.   Diabetes mellitus    Diffuse cystic mastopathy    Dissection, aorta (HCC)    Dr.Dew follows   Diverticulosis    Family history of malignant neoplasm of gastrointestinal tract    Glaucoma 2005   Heart murmur    History of bronchitis    History of pancreatitis 2005   Hyperlipidemia LDL goal <70    Hypertension 1982   Ischemic cardiomyopathy    a. 07/2022 Echo: EF 35-40%.   Personal history of tobacco use, presenting hazards to  health    RBBB    Ulcer     Tobacco Use: Social History   Tobacco Use  Smoking Status Former   Packs/day: 1.00   Years: 15.00   Additional pack years: 0.00   Total pack years: 15.00   Types: Cigarettes   Quit date: 08/03/2003   Years since quitting: 84  Smokeless Tobacco Never    Labs: Review Flowsheet  More data exists      Latest Ref Rng & Units 07/20/2021 10/22/2021 01/20/2022 07/07/2022 07/08/2022  Labs for ITP Cardiac and Pulmonary Rehab  Cholestrol 0 - 200 mg/dL - - - - 098   LDL (calc) 0 - 99 mg/dL - - - - 82   HDL-C >11 mg/dL - - - - 914   Trlycerides <150 mg/dL - - - - 62   Hemoglobin A1c 4.8 - 5.6 % 6.8  7.0  7.1  7.1  -  PH, Arterial 7.35 - 7.45 - - - - 7.373   PCO2 arterial 32 - 48 mmHg - - - - 35.3   Bicarbonate 20.0 - 28.0 mmol/L - - - - 20.6  21.7   TCO2 22 - 32 mmol/L - - - - 22  23   Acid-base deficit 0.0 - 2.0 mmol/L - - - - 4.0  4.0   O2 Saturation % - - - - 92  63      Exercise Target Goals: Exercise Program Goal: Individual exercise prescription set using results from initial 6 min walk test and THRR while considering  patient's activity barriers and safety.   Exercise Prescription Goal: Initial exercise prescription builds to 30-45 minutes a day of aerobic activity, 2-3 days per week.  Home exercise guidelines will be given to patient during program as part of exercise prescription that the participant will acknowledge.   Education: Aerobic Exercise: - Group verbal and visual presentation on the components of exercise prescription. Introduces F.I.T.T principle from ACSM for exercise prescriptions.  Reviews F.I.T.T. principles of aerobic exercise including progression. Written material given at graduation. Flowsheet Row Cardiac Rehab from 09/01/2022 in [facility] Cardiac and Pulmonary Rehab  Education need identified 07/25/22  Date 07/27/22  Educator [facility]  Instruction Review Code 1- Verbalizes Understanding       Education: Resistance Exercise: - Group  verbal and visual presentation on the components of exercise prescription. Introduces F.I.T.T principle from ACSM for exercise prescriptions  Reviews F.I.T.T. principles of resistance exercise including progression. Written material given at graduation.    Education: Exercise & Equipment Safety: - Individual verbal instruction and demonstration of equipment use and safety with use of the equipment. Flowsheet Row Cardiac Rehab from 09/01/2022 in [facility] Cardiac and Pulmonary Rehab  Date 07/18/22  Educator [facility]  Instruction Review Code 1- Verbalizes Understanding       Education: Exercise Physiology & General Exercise Guidelines: - Group verbal and written instruction with models to review the exercise physiology of the cardiovascular system and associated critical values. Provides general exercise guidelines with specific guidelines to those with heart or lung disease.    Education: Flexibility, Balance, Mind/Body Relaxation: - Group verbal and visual presentation with interactive activity on the components of exercise prescription. Introduces F.I.T.T principle from ACSM for exercise prescriptions. Reviews F.I.T.T. principles of flexibility and balance exercise training including progression. Also discusses the mind body connection.  Reviews various relaxation techniques to help reduce and manage stress (i.e. Deep breathing, progressive muscle relaxation, and visualization). Balance handout provided to take home. Written material given at graduation.   Activity Barriers & Risk Stratification:  Activity Barriers & Cardiac Risk Stratification - 07/25/22 1434       Activity Barriers & Cardiac Risk Stratification   Activity Barriers Arthritis;Neck/Spine Problems;Back Problems;Balance Concerns;Muscular Weakness;Deconditioning;Shortness of Breath   back surgery x3, 2 cervical cage, 1 low back   Cardiac Risk Stratification Moderate             6 Minute Walk:  6 Minute Walk     Row Name  07/25/22 1433 09/01/22 0827       6 Minute Walk   Phase Initial Discharge    Distance 892 feet 1510 feet    Distance % Change -- 69.2 %    Distance Feet Change -- 618 ft    Walk Time 6 minutes 6 minutes    # of Rest Breaks 0 0    MPH 1.69 2.85    METS 2.63 3.29    RPE 11 13    Perceived Dyspnea  1 0    VO2 Peak 7.92 11.53    Symptoms Yes (comment) No    Comments tired, chest tightness 1/10, SOB --    Resting HR 88 bpm 90 bpm    Resting BP 146/68 114/54    Resting Oxygen Saturation  99 % 96 %    Exercise Oxygen Saturation  during 6 min walk 94 % 95 %    Max Ex. HR 122 bpm 131 bpm    Max Ex. BP 184/86 162/56    2 Minute Post BP 146/74 --             Oxygen Initial Assessment:   Oxygen Re-Evaluation:   Oxygen Discharge (Final Oxygen Re-Evaluation):   Initial Exercise Prescription:  Initial Exercise Prescription - 07/25/22 1400       Date of Initial Exercise RX and Referring Provider   Date 07/25/22    Referring Provider End, Cristal Deer MD      Oxygen   Maintain Oxygen Saturation 88% or higher      Treadmill   MPH 1.6    Grade 0    Minutes 15    METs 2.23      REL-XR   Level 1    Speed 50    Minutes 15    METs 2      T5 Nustep   Level 1    SPM 80    Minutes 15    METs 2      Track   Laps 23    Minutes 15    METs 2.23      Prescription Details   Frequency (times per week) 3    Duration Progress to 30 minutes of continuous aerobic without signs/symptoms of physical distress  Intensity   THRR 40-80% of Max Heartrate 107-127    Ratings of Perceived Exertion 11-13    Perceived Dyspnea 0-4      Progression   Progression Continue to progress workloads to maintain intensity without signs/symptoms of physical distress.      Resistance Training   Training Prescription Yes    Weight 3 lb    Reps 10-15             Perform Capillary Blood Glucose checks as needed.  Exercise Prescription Changes:   Exercise Prescription Changes      Row Name 07/25/22 1400 08/11/22 1100 08/18/22 0700 08/22/22 1600 09/05/22 1400     Response to Exercise   Blood Pressure (Admit) 146/68 132/60 -- 124/72 114/54   Blood Pressure (Exercise) 184/86 172/68 -- 148/62 162/56   Blood Pressure (Exit) 146/74 110/58 -- 106/60 108/56   Heart Rate (Admit) 88 bpm 104 bpm -- 88 bpm 92 bpm   Heart Rate (Exercise) 122 bpm 140 bpm -- 128 bpm 138 bpm   Heart Rate (Exit) 89 bpm 98 bpm -- 101 bpm 94 bpm   Oxygen Saturation (Admit) 99 % -- -- -- --   Oxygen Saturation (Exercise) 94 % -- -- -- --   Rating of Perceived Exertion (Exercise) 11 15 -- 13 13   Perceived Dyspnea (Exercise) 1 -- -- -- --   Symptoms tired, chest tightness 1/10, SOB none -- none none   Comments walk test results First four days of exercise -- -- --   Duration -- Continue with 30 min of aerobic exercise without signs/symptoms of physical distress. -- Continue with 30 min of aerobic exercise without signs/symptoms of physical distress. Continue with 30 min of aerobic exercise without signs/symptoms of physical distress.   Intensity -- THRR unchanged -- THRR unchanged THRR unchanged     Progression   Progression -- Continue to progress workloads to maintain intensity without signs/symptoms of physical distress. -- Continue to progress workloads to maintain intensity without signs/symptoms of physical distress. Continue to progress workloads to maintain intensity without signs/symptoms of physical distress.   Average METs -- 2.56 -- 3.91 4.01     Resistance Training   Training Prescription -- Yes -- Yes Yes   Weight -- 3 lb -- 3 lb 3 lb   Reps -- 10-15 -- 10-15 10-15     Interval Training   Interval Training -- No -- No No     Treadmill   MPH -- 2.7 -- 2.5 2.7   Grade -- 0 -- 1 2   Minutes -- 15 -- 15 15   METs -- 3.07 -- 3.26 3.81     NuStep   Level -- -- -- 4 4   Minutes -- -- -- 15 15   METs -- -- -- 2.9 3.3     REL-XR   Level -- 3 -- 4 5   Minutes -- 15 -- 15 15    METs -- -- -- 5.2 5.5     T5 Nustep   Level -- 3 -- -- --   Minutes -- 15 -- -- --   METs -- 2.2 -- -- --     Track   Laps -- 26 -- -- --   Minutes -- 15 -- -- --   METs -- 2.41 -- -- --     Home Exercise Plan   Plans to continue exercise at -- -- Home (comment)  walking, treadmill, staff videos Home (comment)  walking, treadmill,  staff videos Home (comment)  walking, treadmill, staff videos   Frequency -- -- Add 3 additional days to program exercise sessions. Add 3 additional days to program exercise sessions. Add 3 additional days to program exercise sessions.   Initial Home Exercises Provided -- -- 08/18/22 08/18/22 08/18/22     Oxygen   Maintain Oxygen Saturation -- 88% or higher -- 88% or higher 88% or higher            Exercise Comments:   Exercise Comments     Row Name 07/27/22 1007 08/30/22 0809         Exercise Comments First full day of exercise!  Patient was oriented to gym and equipment including functions, settings, policies, and procedures.  Patient's individual exercise prescription and treatment plan were reviewed.  All starting workloads were established based on the results of the 6 minute walk test done at initial orientation visit.  The plan for exercise progression was also introduced and progression will be customized based on patient's performance and goals. Moraima reported she is taking 50 mg of lasix as directed by her health team. She has her vitals/weight reviewed daily by her team.               Exercise Goals and Review:   Exercise Goals     Row Name 07/25/22 1437             Exercise Goals   Increase Physical Activity Yes       Intervention Provide advice, education, support and counseling about physical activity/exercise needs.;Develop an individualized exercise prescription for aerobic and resistive training based on initial evaluation findings, risk stratification, comorbidities and participant's personal goals.       Expected  Outcomes Short Term: Attend rehab on a regular basis to increase amount of physical activity.;Long Term: Add in home exercise to make exercise part of routine and to increase amount of physical activity.;Long Term: Exercising regularly at least 3-5 days a week.       Increase Strength and Stamina Yes       Intervention Provide advice, education, support and counseling about physical activity/exercise needs.;Develop an individualized exercise prescription for aerobic and resistive training based on initial evaluation findings, risk stratification, comorbidities and participant's personal goals.       Expected Outcomes Short Term: Increase workloads from initial exercise prescription for resistance, speed, and METs.;Short Term: Perform resistance training exercises routinely during rehab and add in resistance training at home;Long Term: Improve cardiorespiratory fitness, muscular endurance and strength as measured by increased METs and functional capacity ( )       Able to understand and use rate of perceived exertion (RPE) scale Yes       Intervention Provide education and explanation on how to use RPE scale       Expected Outcomes Short Term: Able to use RPE daily in rehab to express subjective intensity level;Long Term:  Able to use RPE to guide intensity level when exercising independently       Able to understand and use Dyspnea scale Yes       Intervention Provide education and explanation on how to use Dyspnea scale       Expected Outcomes Short Term: Able to use Dyspnea scale daily in rehab to express subjective sense of shortness of breath during exertion;Long Term: Able to use Dyspnea scale to guide intensity level when exercising independently       Knowledge and understanding of Target Heart Rate Range (THRR) Yes  Intervention Provide education and explanation of THRR including how the numbers were predicted and where they are located for reference       Expected Outcomes Short Term:  Able to state/look up THRR;Short Term: Able to use daily as guideline for intensity in rehab;Long Term: Able to use THRR to govern intensity when exercising independently       Able to check pulse independently Yes       Intervention Review the importance of being able to check your own pulse for safety during independent exercise;Provide education and demonstration on how to check pulse in carotid and radial arteries.       Expected Outcomes Short Term: Able to explain why pulse checking is important during independent exercise;Long Term: Able to check pulse independently and accurately       Understanding of Exercise Prescription Yes       Intervention Provide education, explanation, and written materials on patient's individual exercise prescription       Expected Outcomes Short Term: Able to explain program exercise prescription;Long Term: Able to explain home exercise prescription to exercise independently                Exercise Goals Re-Evaluation :  Exercise Goals Re-Evaluation     Row Name 07/27/22 1007 08/11/22 1115 08/18/22 0732 08/22/22 1605 08/25/22 0745     Exercise Goal Re-Evaluation   Exercise Goals Review Able to understand and use rate of perceived exertion (RPE) scale;Able to understand and use Dyspnea scale;Knowledge and understanding of Target Heart Rate Range (THRR);Understanding of Exercise Prescription Increase Physical Activity;Increase Strength and Stamina;Understanding of Exercise Prescription Increase Physical Activity;Increase Strength and Stamina;Understanding of Exercise Prescription;Able to understand and use rate of perceived exertion (RPE) scale;Able to understand and use Dyspnea scale;Knowledge and understanding of Target Heart Rate Range (THRR);Able to check pulse independently Increase Physical Activity;Increase Strength and Stamina;Understanding of Exercise Prescription Increase Physical Activity;Increase Strength and Stamina;Understanding of Exercise  Prescription   Comments Reviewed RPE scale, THR and program prescription with pt today.  Pt voiced understanding and was given a copy of goals to take home. Anastaisa is off to a good start in the program. During her first four sessions she had an overall average MET level of 2.56 METs. She also was able to work up to level 3 on both the XR and the T5 nustep. She increased her treadmill speed as well to 2.7 mph with no incline, and walked up to 26 laps on the track. We will continue to moniotr her progress in the program. Reviewed home exercise with pt today.  Pt plans to walk and use her treadmill at home for exercise.  Reviewed THR, pulse, RPE, sign and symptoms, pulse oximetery and when to call 911 or MD.  Also discussed weather considerations and indoor options.  Pt voiced understanding. Crysta continues to do well in rehab. She improved to a 1% incline on the treadmill and also increased to level 4 on the T4 Nustep. She also worked over 5 METS on the XR! RPEs are staying appropriate. We will continue to monitor. Emiyah is doing well with her exercise. She is walking on the track 4 days outside of rehab. She walks about 1.5 miles- 2 miles, varies depending on what incline she is on. We reviewed THR and encouraged her to get a pulse ox to monitor her HR. She monitors her breathing and how hard she is working. She was curious about her METs and we reviewed what she has been doing  so far and discussed progression.   Expected Outcomes Short: Use RPE daily to regulate intensity. Long: Follow program prescription in THR. Short: Begin to add incline on treadmill. Long: Continue to improve strength and stamina. Short: Continue to walk on off days at home Long; Conitnue to improve stamina Short: Increase to using 4 lb handweights Long: Continue to increase overall MET level and stamina Short: Get pulse ox and start monitoring HR during exercise Long: Continue to exercise independently at home    Row Name 09/05/22  1449             Exercise Goal Re-Evaluation   Exercise Goals Review Increase Physical Activity;Increase Strength and Stamina;Understanding of Exercise Prescription       Comments Telicia continues to do well in rehab. She recently completed her post and improved by 69.2%! She also increased her treadmill workload up to 2.7 mph with a 2% incline. She improved to level 5 on the XR as well. She worked at an increased overall average of 4.01 METs as well. We will continue to monitor her progress in the program.       Expected Outcomes Short: Graduate. Long: Continue to exercise independently.                Discharge Exercise Prescription (Final Exercise Prescription Changes):  Exercise Prescription Changes - 09/05/22 1400       Response to Exercise   Blood Pressure (Admit) 114/54    Blood Pressure (Exercise) 162/56    Blood Pressure (Exit) 108/56    Heart Rate (Admit) 92 bpm    Heart Rate (Exercise) 138 bpm    Heart Rate (Exit) 94 bpm    Rating of Perceived Exertion (Exercise) 13    Symptoms none    Duration Continue with 30 min of aerobic exercise without signs/symptoms of physical distress.    Intensity THRR unchanged      Progression   Progression Continue to progress workloads to maintain intensity without signs/symptoms of physical distress.    Average METs 4.01      Resistance Training   Training Prescription Yes    Weight 3 lb    Reps 10-15      Interval Training   Interval Training No      Treadmill   MPH 2.7    Grade 2    Minutes 15    METs 3.81      NuStep   Level 4    Minutes 15    METs 3.3      REL-XR   Level 5    Minutes 15    METs 5.5      Home Exercise Plan   Plans to continue exercise at Home (comment)   walking, treadmill, staff videos   Frequency Add 3 additional days to program exercise sessions.    Initial Home Exercises Provided 08/18/22      Oxygen   Maintain Oxygen Saturation 88% or higher             Nutrition:   Target Goals: Understanding of nutrition guidelines, daily intake of sodium 1500mg , cholesterol 200mg , calories 30% from fat and 7% or less from saturated fats, daily to have 5 or more servings of fruits and vegetables.  Education: All About Nutrition: -Group instruction provided by verbal, written material, interactive activities, discussions, models, and posters to present general guidelines for heart healthy nutrition including fat, fiber, MyPlate, the role of sodium in heart healthy nutrition, utilization of the nutrition label, and  utilization of this knowledge for meal planning. Follow up email sent as well. Written material given at graduation. Flowsheet Row Cardiac Rehab from 09/01/2022 in [facility] Cardiac and Pulmonary Rehab  Education need identified 07/25/22  Date 08/18/22  [part 2]  Educator [facility]  Instruction Review Code 1- Verbalizes Understanding       Biometrics:  Pre Biometrics - 07/25/22 1437       Pre Biometrics   Height 5' 2.75" (1.594 m)    Weight 134 lb 14.4 oz (61.2 kg)    Waist Circumference 32 inches    Hip Circumference 35 inches    Waist to Hip Ratio 0.91 %    BMI (Calculated) 24.08    Single Leg Stand 3.6 seconds             Post Biometrics - 09/01/22 0830        Post  Biometrics   Height 5' 2.75" (1.594 m)    Weight 129 lb 12.8 oz (58.9 kg)    Waist Circumference 34 inches    Hip Circumference 36 inches    Waist to Hip Ratio 0.94 %    BMI (Calculated) 23.17    Single Leg Stand 6.1 seconds             Nutrition Therapy Plan and Nutrition Goals:  Nutrition Therapy & Goals - 07/25/22 1051       Nutrition Therapy   Diet Heart healthy, low Na, T2DM    Drug/Food Interactions Statins/Certain Fruits    Protein (specify units) 75g    Fiber 25 grams    Whole Grain Foods 3 servings    Saturated Fats 12 max. grams    Fruits and Vegetables 8 servings/day    Sodium 2 grams      Personal Nutrition Goals   Nutrition Goal ST: LT:    Comments 82  y.o F admitted to Cardiac Rehab s/p NSTEMI with stent placement. PMHx includes HTN, CAD, CHF, T2DM. Medications reviewed includes atorvastatin, metformin, pantoprazole, vit 12. Makaiah reports eating for her diabetes health for a long time and feels she is doing well with that - she would like to talk more about heart healthy eating. Discussed heart healthy eating and considerations such as sodium, types of fat, and fiber. Reviewed MyPlate and T2DM friendly eating. Sharday uses olive oil spray, eats grains like cheerios and oatmeal, she makes her own yeast bread, she grows a vegetable garden and eats a variety of non-starchy vegetables. Ivyrose does not eat out often and makes many foods herslef; she enjoys stir-fry and homemade soups with lots of vegetables. She eats a variety of protein including salmon, poultry, beef (1-2x/week), and plant proteins like legumes and tofu. Mertie reports that she does not like salmon, but likes salmon cakes - suggested using a whole grain binder (she would like to use oats) and using airfryer to limit added fat.      Intervention Plan   Intervention Prescribe, educate and counsel regarding individualized specific dietary modifications aiming towards targeted core components such as weight, hypertension, lipid management, diabetes, heart failure and other comorbidities.;Nutrition handout(s) given to patient.    Expected Outcomes Short Term Goal: Understand basic principles of dietary content, such as calories, fat, sodium, cholesterol and nutrients.;Short Term Goal: A plan has been developed with personal nutrition goals set during dietitian appointment.;Long Term Goal: Adherence to prescribed nutrition plan.             Nutrition Assessments:  MEDIFICTS Score Key: >=70 Need to make  dietary changes  40-70 Heart Healthy Diet <= 40 Therapeutic Level Cholesterol Diet  Flowsheet Row Cardiac Rehab from 07/25/2022 in [facility] Cardiac and Pulmonary Rehab  Picture Your Plate  Total Score on Admission 72      Picture Your Plate Scores: <16 Unhealthy dietary pattern with much room for improvement. 41-50 Dietary pattern unlikely to meet recommendations for good health and room for improvement. 51-60 More healthful dietary pattern, with some room for improvement.  >60 Healthy dietary pattern, although there may be some specific behaviors that could be improved.    Nutrition Goals Re-Evaluation:  Nutrition Goals Re-Evaluation     Row Name 08/25/22 0751             Goals   Nutrition Goal Focus on lowering sodium (<2g/day) per RD. Eat heart healthy breakfast food choices. Diabetic friendtly diet to assist in lowering A1C.       Comment Aahna is struggling on eating breakfast but is working on trying to eat different foods. She has been eating oatmeal and working on eating it with different toppings. Looking into smoothies/protein shakes possibly as they are versatile. She is watching her labels to monitor her sodium, she finds that everything has sodium in it but slowly working on it. She is looking at it diligently.       Expected Outcome Short: Continue to watch labels for sodium Long: Continue to eat heart healthy diet                Nutrition Goals Discharge (Final Nutrition Goals Re-Evaluation):  Nutrition Goals Re-Evaluation - 08/25/22 0751       Goals   Nutrition Goal Focus on lowering sodium (<2g/day) per RD. Eat heart healthy breakfast food choices. Diabetic friendtly diet to assist in lowering A1C.    Comment Madge is struggling on eating breakfast but is working on trying to eat different foods. She has been eating oatmeal and working on eating it with different toppings. Looking into smoothies/protein shakes possibly as they are versatile. She is watching her labels to monitor her sodium, she finds that everything has sodium in it but slowly working on it. She is looking at it diligently.    Expected Outcome Short: Continue to watch labels  for sodium Long: Continue to eat heart healthy diet             Psychosocial: Target Goals: Acknowledge presence or absence of significant depression and/or stress, maximize coping skills, provide positive support system. Participant is able to verbalize types and ability to use techniques and skills needed for reducing stress and depression.   Education: Stress, Anxiety, and Depression - Group verbal and visual presentation to define topics covered.  Reviews how body is impacted by stress, anxiety, and depression.  Also discusses healthy ways to reduce stress and to treat/manage anxiety and depression.  Written material given at graduation.   Education: Sleep Hygiene -Provides group verbal and written instruction about how sleep can affect your health.  Define sleep hygiene, discuss sleep cycles and impact of sleep habits. Review good sleep hygiene tips.    Initial Review & Psychosocial Screening:  Initial Psych Review & Screening - 07/18/22 1030       Initial Review   Current issues with Current Stress Concerns      Family Dynamics   Good Support System? Yes    Comments She is frustrated that she layed in the hospital for awhile when she had her heart event. Her son is here in town  and friends she can look to for support. She wnats to be able to get back on her lawnmower and do yardwork.      Barriers   Psychosocial barriers to participate in program The patient should benefit from training in stress management and relaxation.;There are no identifiable barriers or psychosocial needs.      Screening Interventions   Interventions Provide feedback about the scores to participant;To provide support and resources with identified psychosocial needs;Encouraged to exercise    Expected Outcomes Short Term goal: Utilizing psychosocial counselor, staff and physician to assist with identification of specific Stressors or current issues interfering with healing process. Setting desired goal  for each stressor or current issue identified.;Long Term Goal: Stressors or current issues are controlled or eliminated.;Short Term goal: Identification and review with participant of any Quality of Life or Depression concerns found by scoring the questionnaire.;Long Term goal: The participant improves quality of Life and PHQ9 Scores as seen by post scores and/or verbalization of changes             Quality of Life Scores:   Quality of Life - 07/25/22 1438       Quality of Life   Select Quality of Life      Quality of Life Scores   Health/Function Pre 10.27 %    Socioeconomic Pre 25.94 %    Psych/Spiritual Pre 18.17 %    Family Pre 18.6 %    GLOBAL Pre 16.7 %            Scores of 19 and below usually indicate a poorer quality of life in these areas.  A difference of  2-3 points is a clinically meaningful difference.  A difference of 2-3 points in the total score of the Quality of Life Index has been associated with significant improvement in overall quality of life, self-image, physical symptoms, and general health in studies assessing change in quality of life.  PHQ-9: Review Flowsheet  More data exists      08/09/2022 08/02/2022 07/25/2022 07/12/2022 03/08/2022  Depression screen PHQ 2/9  Decreased Interest 0 1 0 0 1  Down, Depressed, Hopeless 0 0 0 0 0  PHQ - 2 Score 0 1 0 0 1  Altered sleeping 0 1 0 1 1  Tired, decreased energy 3 3 3 2 1   Change in appetite 3 0 3 0 1  Feeling bad or failure about yourself  0 0 0 0 0  Trouble concentrating 0 0 0 0 0  Moving slowly or fidgety/restless 0 0 0 0 0  Suicidal thoughts 0 0 0 0 0  PHQ-9 Score 6 5 6 3 4   Difficult doing work/chores Extremely dIfficult Not difficult at all Very difficult Not difficult at all Not difficult at all   Interpretation of Total Score  Total Score Depression Severity:  1-4 = Minimal depression, 5-9 = Mild depression, 10-14 = Moderate depression, 15-19 = Moderately severe depression, 20-27 = Severe  depression   Psychosocial Evaluation and Intervention:  Psychosocial Evaluation - 07/18/22 1035       Psychosocial Evaluation & Interventions   Interventions Encouraged to exercise with the program and follow exercise prescription;Relaxation education;Stress management education    Comments She is frustrated that she layed in the hospital for awhile when she had her heart event. Her son is here in town and friends she can look to for support. She wnats to be able to get back on her lawnmower and do yardwork.    Expected Outcomes  Short: Start [facility] to help with mood. Long: Maintain a healthy mental state    Continue Psychosocial Services  Follow up required by staff             Psychosocial Re-Evaluation:  Psychosocial Re-Evaluation     Row Name 08/05/22 1001 08/25/22 0801           Psychosocial Re-Evaluation   Current issues with Current Sleep Concerns;Current Stress Concerns Current Stress Concerns      Comments Amador Cunas states that she does not sleep well and never has. Her heart attack is her main stressor. She wants to get back at taking care of her dog and yard work. Her shortness of breath and weakness gets to her and wants to improve. Graciella states her sleep has been much better. She is moving more at home as she is walking more on the track. Her health is her biggest stressor as she is not able to do what she could've done before. Her goal is to increase her walking speed and distance gradually overtime. She is enjoying the program and understanding what her progress looks like. We went over her MET level and how much she has been progressing. She is pleased so far and excited to continue working towards her exercise goals.      Expected Outcomes Short: continue exercise regularly to improve mood. Long: maintain a positive outlook on her health. Short: Continue coming to rehab for mood boost Long: Continue to maintain positive atttiude      Interventions Encouraged to attend  Cardiac Rehabilitation for the exercise Encouraged to attend Cardiac Rehabilitation for the exercise      Continue Psychosocial Services  Follow up required by staff Follow up required by staff               Psychosocial Discharge (Final Psychosocial Re-Evaluation):  Psychosocial Re-Evaluation - 08/25/22 0801       Psychosocial Re-Evaluation   Current issues with Current Stress Concerns    Comments Telesa states her sleep has been much better. She is moving more at home as she is walking more on the track. Her health is her biggest stressor as she is not able to do what she could've done before. Her goal is to increase her walking speed and distance gradually overtime. She is enjoying the program and understanding what her progress looks like. We went over her MET level and how much she has been progressing. She is pleased so far and excited to continue working towards her exercise goals.    Expected Outcomes Short: Continue coming to rehab for mood boost Long: Continue to maintain positive atttiude    Interventions Encouraged to attend Cardiac Rehabilitation for the exercise    Continue Psychosocial Services  Follow up required by staff             Vocational Rehabilitation: Provide vocational rehab assistance to qualifying candidates.   Vocational Rehab Evaluation & Intervention:  Vocational Rehab - 07/25/22 1439       Initial Vocational Rehab Evaluation & Intervention   Assessment shows need for Vocational Rehabilitation No   retired            Education: Education Goals: Education classes will be provided on a variety of topics geared toward better understanding of heart health and risk factor modification. Participant will state understanding/return demonstration of topics presented as noted by education test scores.  Learning Barriers/Preferences:  Learning Barriers/Preferences - 07/18/22 1027       Learning Barriers/Preferences  Learning Barriers None     Learning Preferences None             General Cardiac Education Topics:  AED/CPR: - Group verbal and written instruction with the use of models to demonstrate the basic use of the AED with the basic ABC's of resuscitation.   Anatomy and Cardiac Procedures: - Group verbal and visual presentation and models provide information about basic cardiac anatomy and function. Reviews the testing methods done to diagnose heart disease and the outcomes of the test results. Describes the treatment choices: Medical Management, Angioplasty, or Coronary Bypass Surgery for treating various heart conditions including Myocardial Infarction, Angina, Valve Disease, and Cardiac Arrhythmias.  Written material given at graduation. Flowsheet Row Cardiac Rehab from 09/01/2022 in [facility] Cardiac and Pulmonary Rehab  Education need identified 07/25/22  Date 08/25/22  Educator SB  Instruction Review Code 1- Verbalizes Understanding       Medication Safety: - Group verbal and visual instruction to review commonly prescribed medications for heart and lung disease. Reviews the medication, class of the drug, and side effects. Includes the steps to properly store meds and maintain the prescription regimen.  Written material given at graduation. Flowsheet Row Cardiac Rehab from 09/01/2022 in [facility] Cardiac and Pulmonary Rehab  Date 09/01/22  Educator SB  Instruction Review Code 1- Verbalizes Understanding       Intimacy: - Group verbal instruction through game format to discuss how heart and lung disease can affect sexual intimacy. Written material given at graduation.. Flowsheet Row Cardiac Rehab from 09/01/2022 in [facility] Cardiac and Pulmonary Rehab  Date 07/27/22  Educator [facility]  Instruction Review Code 1- Verbalizes Understanding       Know Your Numbers and Heart Failure: - Group verbal and visual instruction to discuss disease risk factors for cardiac and pulmonary disease and treatment options.  Reviews associated  critical values for Overweight/Obesity, Hypertension, Cholesterol, and Diabetes.  Discusses basics of heart failure: signs/symptoms and treatments.  Introduces Heart Failure Zone chart for action plan for heart failure.  Written material given at graduation.   Infection Prevention: - Provides verbal and written material to individual with discussion of infection control including proper hand washing and proper equipment cleaning during exercise session. Flowsheet Row Cardiac Rehab from 09/01/2022 in [facility] Cardiac and Pulmonary Rehab  Date 07/18/22  Educator [facility]  Instruction Review Code 1- Verbalizes Understanding       Falls Prevention: - Provides verbal and written material to individual with discussion of falls prevention and safety. Flowsheet Row Cardiac Rehab from 09/01/2022 in [facility] Cardiac and Pulmonary Rehab  Date 07/18/22  Educator [facility]  Instruction Review Code 1- Verbalizes Understanding       Other: -Provides group and verbal instruction on various topics (see comments)   Knowledge Questionnaire Score:  Knowledge Questionnaire Score - 07/25/22 1439       Knowledge Questionnaire Score   Pre Score 23/26             Core Components/Risk Factors/Patient Goals at Admission:  Personal Goals and Risk Factors at Admission - 07/25/22 1439       Core Components/Risk Factors/Patient Goals on Admission    Weight Management Yes;Weight Maintenance    Intervention Weight Management: Develop a combined nutrition and exercise program designed to reach desired caloric intake, while maintaining appropriate intake of nutrient and fiber, sodium and fats, and appropriate energy expenditure required for the weight goal.;Weight Management: Provide education and appropriate resources to help participant work on and attain dietary goals.;Weight  Management/Obesity: Establish reasonable short term and long term weight goals.    Admit Weight 134 lb 14.4 oz (61.2 kg)    Goal Weight: Short Term 134  lb (60.8 kg)    Goal Weight: Long Term 134 lb (60.8 kg)    Expected Outcomes Short Term: Continue to assess and modify interventions until short term weight is achieved;Long Term: Adherence to nutrition and physical activity/exercise program aimed toward attainment of established weight goal;Weight Maintenance: Understanding of the daily nutrition guidelines, which includes 25-35% calories from fat, 7% or less cal from saturated fats, less than 200mg  cholesterol, less than 1.5gm of sodium, & 5 or more servings of fruits and vegetables daily    Diabetes Yes    Intervention Provide education about signs/symptoms and action to take for hypo/hyperglycemia.;Provide education about proper nutrition, including hydration, and aerobic/resistive exercise prescription along with prescribed medications to achieve blood glucose in normal ranges: Fasting glucose 65-99 mg/dL    Expected Outcomes Short Term: Participant verbalizes understanding of the signs/symptoms and immediate care of hyper/hypoglycemia, proper foot care and importance of medication, aerobic/resistive exercise and nutrition plan for blood glucose control.;Long Term: Attainment of HbA1C < 7%.    Heart Failure Yes    Intervention Provide a combined exercise and nutrition program that is supplemented with education, support and counseling about heart failure. Directed toward relieving symptoms such as shortness of breath, decreased exercise tolerance, and extremity edema.    Expected Outcomes Improve functional capacity of life;Short term: Attendance in program 2-3 days a week with increased exercise capacity. Reported lower sodium intake. Reported increased fruit and vegetable intake. Reports medication compliance.;Short term: Daily weights obtained and reported for increase. Utilizing diuretic protocols set by physician.;Long term: Adoption of self-care skills and reduction of barriers for early signs and symptoms recognition and intervention leading to  self-care maintenance.    Hypertension Yes    Intervention Provide education on lifestyle modifcations including regular physical activity/exercise, weight management, moderate sodium restriction and increased consumption of fresh fruit, vegetables, and low fat dairy, alcohol moderation, and smoking cessation.;Monitor prescription use compliance.    Expected Outcomes Short Term: Continued assessment and intervention until BP is < 140/5mm HG in hypertensive participants. < 130/63mm HG in hypertensive participants with diabetes, heart failure or chronic kidney disease.;Long Term: Maintenance of blood pressure at goal levels.    Lipids Yes    Intervention Provide education and support for participant on nutrition & aerobic/resistive exercise along with prescribed medications to achieve LDL 70mg , HDL >40mg .    Expected Outcomes Short Term: Participant states understanding of desired cholesterol values and is compliant with medications prescribed. Participant is following exercise prescription and nutrition guidelines.;Long Term: Cholesterol controlled with medications as prescribed, with individualized exercise RX and with personalized nutrition plan. Value goals: LDL < 70mg , HDL > 40 mg.             Education:Diabetes - Individual verbal and written instruction to review signs/symptoms of diabetes, desired ranges of glucose level fasting, after meals and with exercise. Acknowledge that pre and post exercise glucose checks will be done for 3 sessions at entry of program. Flowsheet Row Cardiac Rehab from 09/01/2022 in [facility] Cardiac and Pulmonary Rehab  Date 07/18/22  Educator [facility]  Instruction Review Code 1- Verbalizes Understanding       Core Components/Risk Factors/Patient Goals Review:   Goals and Risk Factor Review     Row Name 08/25/22 0756             Core Components/Risk  Factors/Patient Goals Review   Personal Goals Review Weight Management/Obesity;Heart Failure;Hypertension;Diabetes        Review Pamelyn is enrolled in [facility] where they monitor all of her vitals, and will call her if they notice anything abnormal. She is weighing herself at home and has maintained so far. She is aware to be mindful of any sudden weight gain in a short period of time. She is now taking Lasix and has been doing well on it. She is checking blood sugars at home ranging around 140. She believes her last  A1C was around 7.2, but  wants it around 6.5. Getting a new check next month. Checking BP at home as well, ranging around 120/60s. [facility] monitors all of her vitals as well. Has not experienced any heart failure symptoms at this time.       Expected Outcomes Short: Continue to keep an eye on BP, blood sugar, and weight Long: Continue to manage lifestyle risk factors; work on decreasing  A1C                Core Components/Risk Factors/Patient Goals at Discharge (Final Review):   Goals and Risk Factor Review - 08/25/22 0756       Core Components/Risk Factors/Patient Goals Review   Personal Goals Review Weight Management/Obesity;Heart Failure;Hypertension;Diabetes    Review Zonya is enrolled in [facility] where they monitor all of her vitals, and will call her if they notice anything abnormal. She is weighing herself at home and has maintained so far. She is aware to be mindful of any sudden weight gain in a short period of time. She is now taking Lasix and has been doing well on it. She is checking blood sugars at home ranging around 140. She believes her last  A1C was around 7.2, but  wants it around 6.5. Getting a new check next month. Checking BP at home as well, ranging around 120/60s. [facility] monitors all of her vitals as well. Has not experienced any heart failure symptoms at this time.    Expected Outcomes Short: Continue to keep an eye on BP, blood sugar, and weight Long: Continue to manage lifestyle risk factors; work on decreasing  A1C              ITP Comments:  ITP Comments     Row Name 07/18/22 1026 07/25/22 1424 07/27/22 1006 08/17/22 0908 08/30/22 0808   ITP Comments Virtual Visit completed. Patient informed on EP and RD appointment and 6 Minute walk test. Patient also informed of patient health questionnaires on My Chart. Patient Verbalizes understanding. Visit diagnosis can be found in [facility] 07/07/2022. Completed and gym orientation. Initial ITP created and sent for review to Dr. Ahmed Prima covering for Dr. Bethann Punches, Medical Director. First full day of exercise!  Patient was oriented to gym and equipment including functions, settings, policies, and procedures.  Patient's individual exercise prescription and treatment plan were reviewed.  All starting workloads were established based on the results of the 6 minute walk test done at initial orientation visit.  The plan for exercise progression was also introduced and progression will be customized based on patient's performance and goals. 30 day review completed. ITP sent to Dr. Bethann Punches, Medical Director of Cardiac Rehab. Continue with ITP unless changes are made by physician. Alicya reported she is taking 50 mg of lasix as directed by her health team. She has her vitals/weight reviewed daily by her team.    Row Name 09/14/22 [phone]  ITP Comments 30 Day review completed. Medical Director ITP review done, changes made as directed, and signed approval by Medical Director.               Discharged Comments:

## 2022-09-16 DIAGNOSIS — I2510 Atherosclerotic heart disease of native coronary artery without angina pectoris: Secondary | ICD-10-CM | POA: Diagnosis not present

## 2022-09-16 DIAGNOSIS — R079 Chest pain, unspecified: Secondary | ICD-10-CM | POA: Diagnosis not present

## 2022-09-16 DIAGNOSIS — E7800 Pure hypercholesterolemia, unspecified: Secondary | ICD-10-CM | POA: Diagnosis not present

## 2022-09-16 DIAGNOSIS — Z79899 Other long term (current) drug therapy: Secondary | ICD-10-CM | POA: Diagnosis not present

## 2022-09-16 DIAGNOSIS — I10 Essential (primary) hypertension: Secondary | ICD-10-CM | POA: Diagnosis not present

## 2022-09-16 DIAGNOSIS — E119 Type 2 diabetes mellitus without complications: Secondary | ICD-10-CM | POA: Diagnosis not present

## 2022-09-16 DIAGNOSIS — E612 Magnesium deficiency: Secondary | ICD-10-CM | POA: Diagnosis not present

## 2022-09-19 ENCOUNTER — Telehealth: Payer: Self-pay

## 2022-09-19 NOTE — Telephone Encounter (Signed)
Fax received from [facility] medication changes were made: Increase Magnesium Oxide to 250mg  TID for mag level of 1.4 on 09/16/2022  Will route to [place] for FYI.

## 2022-09-20 ENCOUNTER — Other Ambulatory Visit: Payer: Self-pay

## 2022-09-20 NOTE — Progress Notes (Signed)
Meds updated per [facility].

## 2022-09-25 ENCOUNTER — Other Ambulatory Visit: Payer: Self-pay

## 2022-09-25 ENCOUNTER — Emergency Department
Admission: EM | Admit: 2022-09-25 | Discharge: 2022-09-25 | Disposition: A | Discharge status: Home / Self Care | Payer: Medicare HMO | Attending: Emergency Medicine | Admitting: Emergency Medicine

## 2022-09-25 ENCOUNTER — Emergency Department: Payer: Medicare HMO

## 2022-09-25 DIAGNOSIS — I509 Heart failure, unspecified: Secondary | ICD-10-CM | POA: Insufficient documentation

## 2022-09-25 DIAGNOSIS — R0789 Other chest pain: Secondary | ICD-10-CM | POA: Diagnosis not present

## 2022-09-25 DIAGNOSIS — I110 Hypertensive heart disease with heart failure: Secondary | ICD-10-CM | POA: Diagnosis not present

## 2022-09-25 DIAGNOSIS — R079 Chest pain, unspecified: Secondary | ICD-10-CM

## 2022-09-25 DIAGNOSIS — Z7902 Long term (current) use of antithrombotics/antiplatelets: Secondary | ICD-10-CM | POA: Diagnosis not present

## 2022-09-25 DIAGNOSIS — Z7982 Long term (current) use of aspirin: Secondary | ICD-10-CM | POA: Diagnosis not present

## 2022-09-25 DIAGNOSIS — I2510 Atherosclerotic heart disease of native coronary artery without angina pectoris: Secondary | ICD-10-CM | POA: Insufficient documentation

## 2022-09-25 DIAGNOSIS — E119 Type 2 diabetes mellitus without complications: Secondary | ICD-10-CM | POA: Diagnosis not present

## 2022-09-25 LAB — BASIC METABOLIC PANEL
Anion gap: 11 (ref 5–15)
BUN: 25 mg/dL — ABNORMAL HIGH (ref 8–23)
CO2: 24 mmol/L (ref 22–32)
Calcium: 9.1 mg/dL (ref 8.9–10.3)
Chloride: 99 mmol/L (ref 98–111)
Creatinine, Ser: 1.11 mg/dL — ABNORMAL HIGH (ref 0.44–1.00)
GFR, Estimated: 49 mL/min — ABNORMAL LOW (ref >60)
Glucose, Bld: 174 mg/dL — ABNORMAL HIGH (ref 70–99)
Potassium: 3.9 mmol/L (ref 3.5–5.1)
Sodium: 134 mmol/L — ABNORMAL LOW (ref 135–145)

## 2022-09-25 LAB — BRAIN NATRIURETIC PEPTIDE: B Natriuretic Peptide: 335 pg/mL — ABNORMAL HIGH (ref 0.0–100.0)

## 2022-09-25 LAB — TROPONIN I (HIGH SENSITIVITY)
Troponin I (High Sensitivity): 20 ng/L — ABNORMAL HIGH (ref <18)
Troponin I (High Sensitivity): 22 ng/L — ABNORMAL HIGH (ref <18)

## 2022-09-25 LAB — CBC
HCT: 33.6 % — ABNORMAL LOW (ref 36.0–46.0)
Hemoglobin: 10.9 g/dL — ABNORMAL LOW (ref 12.0–15.0)
MCH: 27.7 pg (ref 26.0–34.0)
MCHC: 32.4 g/dL (ref 30.0–36.0)
MCV: 85.3 fL (ref 80.0–100.0)
Platelets: 272 10*3/uL (ref 150–400)
RBC: 3.94 MIL/uL (ref 3.87–5.11)
RDW: 15.7 % — ABNORMAL HIGH (ref 11.5–15.5)
WBC: 5.9 10*3/uL (ref 4.0–10.5)
nRBC: 0 % (ref 0.0–0.2)

## 2022-09-25 MED ORDER — ACETAMINOPHEN 325 MG PO TABS
650.0000 mg | ORAL_TABLET | Freq: Once | ORAL | Status: AC
  Administered 2022-09-25: 650 mg via ORAL
  Filled 2022-09-25: qty 2

## 2022-09-25 NOTE — ED Provider Notes (Signed)
[facility] Provider Note    Event Date/Time   First MD Initiated Contact with Patient 09/25/22 1617     (approximate)   History   Chest Pain   HPI  Jessica Wheeler is a 82 y.o. female with a history of CAD, CHF diabetes, hypertension, and hyperlipidemia who presents with discomfort described as pressure or tightness, acute onset around 2:30 AM, and persisting since then.  When she awoke and checked her blood pressure the systolic was over 200.  The patient reports some lightheadedness earlier that is now improved.  She denies any acute shortness of breath or any nausea or vomiting.  She states that she took a few nitro after the pain initially started.  She said this lowered her blood pressure to as low as the 40s diastolic.  The patient called her cardiologist office and was told to come to the ED for further evaluation.  She denies any leg swelling.  She has no fever or chills.  She has no symptoms of dark stools or GI bleeding.  I reviewed the past medical records.  The patient was most recently admitted on 3/21 with GI bleed.  She is on aspirin and Plavix and Brilinta was discontinued at that time.  Previously she was admitted earlier in March with GI bleed and NSTEMI.  She was most recently seen by Dr. Juliann Pares from cardiology on 5/17.   Physical Exam   Triage Vital Signs: ED Triage Vitals  Enc Vitals Group     BP 09/25/22 1602 (!) 117/59     Pulse Rate 09/25/22 1602 88     Resp 09/25/22 1602 19     Temp 09/25/22 1602 98 F (36.7 C)     Temp Source 09/25/22 1602 Oral     SpO2 09/25/22 1602 95 %     Weight 09/25/22 1600 126 lb (57.2 kg)     Height 09/25/22 1600 5\' 3"  (1.6 m)     Head Circumference --      Peak Flow --      Pain Score 09/25/22 1600 5     Pain Loc --      Pain Edu? --      Excl. in GC? --     Most recent vital signs: Vitals:   09/25/22 1815 09/25/22 1830  BP: 122/61 124/77  Pulse: 70 74  Resp: 12 (!) 22  Temp:    SpO2: 96%  97%     General: Awake, no distress.  CV:  Good peripheral perfusion.  Normal heart sounds. Resp:  Normal effort.  Lungs CTAB. Abd:  No distention. Other:  No peripheral edema.   ED Results / Procedures / Treatments   Labs (all labs ordered are listed, but only abnormal results are displayed) Labs Reviewed  BASIC METABOLIC PANEL - Abnormal; Notable for the following components:      Result Value   Sodium 134 (*)    Glucose, Bld 174 (*)    BUN 25 (*)    Creatinine, Ser 1.11 (*)    GFR, Estimated 49 (*)    All other components within normal limits  CBC - Abnormal; Notable for the following components:   Hemoglobin 10.9 (*)    HCT 33.6 (*)    RDW 15.7 (*)    All other components within normal limits  BRAIN NATRIURETIC PEPTIDE - Abnormal; Notable for the following components:   B Natriuretic Peptide 335.0 (*)    All other components within normal limits  TROPONIN I (HIGH SENSITIVITY) - Abnormal; Notable for the following components:   Troponin I (High Sensitivity) 20 (*)    All other components within normal limits  TROPONIN I (HIGH SENSITIVITY) - Abnormal; Notable for the following components:   Troponin I (High Sensitivity) 22 (*)    All other components within normal limits     EKG  ED ECG REPORT I, [name], the attending physician, personally viewed and interpreted this ECG.  Date: 09/25/2022 EKG Time: 1559 Rate: 87 Rhythm: normal sinus rhythm QRS Axis: normal Intervals: RBBB ST/T Wave abnormalities: normal Narrative Interpretation: no evidence of acute ischemia; no significant change compared to EKG of 07/29/2022    RADIOLOGY  Chest x-ray: I independently viewed and interpreted the images; there is no focal consolidation or edema   PROCEDURES:  Critical Care performed: No  Procedures   MEDICATIONS ORDERED IN ED: Medications  acetaminophen (TYLENOL) tablet 650 mg (650 mg Oral Given 09/25/22 1726)     IMPRESSION / MDM / ASSESSMENT AND  PLAN / ED COURSE  I reviewed the triage vital signs and the nursing notes.  82 year old female with PMH as noted above presents with chest discomfort since 2:30 this morning initially associated with elevated blood pressure.  BP has improved after nitroglycerin earlier this morning but the chest discomfort remains.  EKG shows no acute abnormalities.  Differential diagnosis includes, but is not limited to, ACS, CHF exacerbation, hypertensive crisis, musculoskeletal pain, GERD.  Currently the blood pressure is normal.  We will obtain lab workup and reassess.  Patient's presentation is most consistent with acute presentation with potential threat to life or bodily function.  The patient is on the cardiac monitor to evaluate for evidence of arrhythmia and/or significant heart rate changes.  ----------------------------------------- 7:08 PM on 09/25/2022 -----------------------------------------  Troponins are negative x 2.  BNP is minimally elevated.  Creatinine is also minimally elevated.  The patient's vital signs have remained stable.  She still has mild chest discomfort but has not worsened or changed at all.  I did consider whether the patient may benefit from inpatient admission given her elevated cardiac risks.  However her workup is reassuring at this time.  I consulted and discussed the case with Dr. Juliann Pares from cardiology, who evaluated the patient in the ED.  He advises that she is stable for discharge home and does not recommend any further ED workup or admission.  He recommends that the patient follow-up with him in the next few days.   The patient herself feels comfortable and would like to go home. I counseled the patient on the results of the workup and the plan of care.  I gave strict return precautions and she expressed understanding.     FINAL CLINICAL IMPRESSION(S) / ED DIAGNOSES   Final diagnoses:  Chest pain, unspecified type     Rx / DC Orders   ED Discharge  Orders     None        Note:  This document was prepared using Dragon voice recognition software and may include unintentional dictation errors.    [name], MD 09/25/22 [place]

## 2022-09-25 NOTE — ED Notes (Signed)
C/o HA since NTG

## 2022-09-25 NOTE — Discharge Instructions (Signed)
Continue taking your normal medications as prescribed.  Call Dr. Glennis Brink office on Tuesday to arrange for follow-up within the next several days.  Return to the ER for new, worsening, or persistent severe chest pain, difficulty breathing, weakness or lightheadedness, severely elevated blood pressures, or any other new or worsening symptoms that concern you.

## 2022-09-25 NOTE — ED Notes (Signed)
HA improved 1/10 nagging mild, rates chest pressure at 5/10, no changes, alert, NAD, calm, interactive.

## 2022-09-25 NOTE — ED Triage Notes (Addendum)
Pt reports cp since the wee hours of the morning this am. Pt reports pain is pressure like in nature and radiates from her mid chest to her left shoulder and up her neck. Pt reports has taken a total of 3 nitro today with no relief. Pt reports called her cardiologist who advised her to come to the ED. Pt reports has a cardiac history and sees Dr. Juliann Pares. Pt also reports she has had a change in her medication this week.

## 2022-09-27 ENCOUNTER — Encounter: Payer: Medicare HMO | Admitting: Family

## 2022-09-28 ENCOUNTER — Other Ambulatory Visit: Payer: Self-pay | Admitting: Family Medicine

## 2022-09-28 DIAGNOSIS — E1169 Type 2 diabetes mellitus with other specified complication: Secondary | ICD-10-CM

## 2022-09-30 ENCOUNTER — Other Ambulatory Visit
Admission: RE | Admit: 2022-09-30 | Discharge: 2022-09-30 | Disposition: A | Discharge status: Home / Self Care | Payer: Medicare HMO | Source: Ambulatory Visit | Attending: Internal Medicine | Admitting: Internal Medicine

## 2022-09-30 DIAGNOSIS — I998 Other disorder of circulatory system: Secondary | ICD-10-CM | POA: Diagnosis not present

## 2022-09-30 DIAGNOSIS — I252 Old myocardial infarction: Secondary | ICD-10-CM | POA: Diagnosis not present

## 2022-09-30 DIAGNOSIS — I2510 Atherosclerotic heart disease of native coronary artery without angina pectoris: Secondary | ICD-10-CM | POA: Diagnosis not present

## 2022-09-30 DIAGNOSIS — R0602 Shortness of breath: Secondary | ICD-10-CM | POA: Insufficient documentation

## 2022-09-30 DIAGNOSIS — Z955 Presence of coronary angioplasty implant and graft: Secondary | ICD-10-CM | POA: Diagnosis not present

## 2022-09-30 DIAGNOSIS — E7800 Pure hypercholesterolemia, unspecified: Secondary | ICD-10-CM | POA: Insufficient documentation

## 2022-09-30 DIAGNOSIS — E119 Type 2 diabetes mellitus without complications: Secondary | ICD-10-CM | POA: Diagnosis not present

## 2022-09-30 DIAGNOSIS — I10 Essential (primary) hypertension: Secondary | ICD-10-CM | POA: Diagnosis not present

## 2022-09-30 DIAGNOSIS — Z01818 Encounter for other preprocedural examination: Secondary | ICD-10-CM | POA: Insufficient documentation

## 2022-09-30 LAB — BRAIN NATRIURETIC PEPTIDE: B Natriuretic Peptide: 211.2 pg/mL — ABNORMAL HIGH (ref 0.0–100.0)

## 2022-10-04 ENCOUNTER — Other Ambulatory Visit: Payer: Self-pay

## 2022-10-04 ENCOUNTER — Emergency Department: Payer: Medicare HMO

## 2022-10-04 ENCOUNTER — Other Ambulatory Visit: Payer: Self-pay | Admitting: Internal Medicine

## 2022-10-04 ENCOUNTER — Emergency Department
Admission: EM | Admit: 2022-10-04 | Discharge: 2022-10-04 | Disposition: A | Discharge status: Home / Self Care | Payer: Medicare HMO | Attending: Emergency Medicine | Admitting: Emergency Medicine

## 2022-10-04 DIAGNOSIS — E119 Type 2 diabetes mellitus without complications: Secondary | ICD-10-CM | POA: Diagnosis not present

## 2022-10-04 DIAGNOSIS — I110 Hypertensive heart disease with heart failure: Secondary | ICD-10-CM | POA: Insufficient documentation

## 2022-10-04 DIAGNOSIS — R0789 Other chest pain: Secondary | ICD-10-CM | POA: Insufficient documentation

## 2022-10-04 DIAGNOSIS — I509 Heart failure, unspecified: Secondary | ICD-10-CM | POA: Insufficient documentation

## 2022-10-04 DIAGNOSIS — R42 Dizziness and giddiness: Secondary | ICD-10-CM | POA: Diagnosis not present

## 2022-10-04 DIAGNOSIS — I10 Essential (primary) hypertension: Secondary | ICD-10-CM | POA: Diagnosis not present

## 2022-10-04 DIAGNOSIS — R079 Chest pain, unspecified: Secondary | ICD-10-CM | POA: Diagnosis not present

## 2022-10-04 DIAGNOSIS — Z01818 Encounter for other preprocedural examination: Secondary | ICD-10-CM

## 2022-10-04 DIAGNOSIS — I998 Other disorder of circulatory system: Secondary | ICD-10-CM

## 2022-10-04 DIAGNOSIS — I2510 Atherosclerotic heart disease of native coronary artery without angina pectoris: Secondary | ICD-10-CM | POA: Insufficient documentation

## 2022-10-04 LAB — CBC WITH DIFFERENTIAL/PLATELET
Abs Immature Granulocytes: 0.02 10*3/uL (ref 0.00–0.07)
Basophils Absolute: 0.1 10*3/uL (ref 0.0–0.1)
Basophils Relative: 1 %
Eosinophils Absolute: 0.2 10*3/uL (ref 0.0–0.5)
Eosinophils Relative: 3 %
HCT: 31.3 % — ABNORMAL LOW (ref 36.0–46.0)
Hemoglobin: 10.3 g/dL — ABNORMAL LOW (ref 12.0–15.0)
Immature Granulocytes: 0 %
Lymphocytes Relative: 13 %
Lymphs Abs: 1 10*3/uL (ref 0.7–4.0)
MCH: 28.1 pg (ref 26.0–34.0)
MCHC: 32.9 g/dL (ref 30.0–36.0)
MCV: 85.3 fL (ref 80.0–100.0)
Monocytes Absolute: 0.7 10*3/uL (ref 0.1–1.0)
Monocytes Relative: 9 %
Neutro Abs: 5.6 10*3/uL (ref 1.7–7.7)
Neutrophils Relative %: 74 %
Platelets: 230 10*3/uL (ref 150–400)
RBC: 3.67 MIL/uL — ABNORMAL LOW (ref 3.87–5.11)
RDW: 16.4 % — ABNORMAL HIGH (ref 11.5–15.5)
WBC: 7.5 10*3/uL (ref 4.0–10.5)
nRBC: 0 % (ref 0.0–0.2)

## 2022-10-04 LAB — BASIC METABOLIC PANEL
Anion gap: 12 (ref 5–15)
BUN: 27 mg/dL — ABNORMAL HIGH (ref 8–23)
CO2: 26 mmol/L (ref 22–32)
Calcium: 9.3 mg/dL (ref 8.9–10.3)
Chloride: 100 mmol/L (ref 98–111)
Creatinine, Ser: 0.87 mg/dL (ref 0.44–1.00)
GFR, Estimated: >60 mL/min (ref >60)
Glucose, Bld: 170 mg/dL — ABNORMAL HIGH (ref 70–99)
Potassium: 3.8 mmol/L (ref 3.5–5.1)
Sodium: 138 mmol/L (ref 135–145)

## 2022-10-04 LAB — TROPONIN I (HIGH SENSITIVITY)
Troponin I (High Sensitivity): 22 ng/L — ABNORMAL HIGH (ref <18)
Troponin I (High Sensitivity): 19 ng/L — ABNORMAL HIGH (ref <18)

## 2022-10-04 LAB — MAGNESIUM: Magnesium: 1.4 mg/dL — ABNORMAL LOW (ref 1.7–2.4)

## 2022-10-04 LAB — BRAIN NATRIURETIC PEPTIDE: B Natriuretic Peptide: 193.3 pg/mL — ABNORMAL HIGH (ref 0.0–100.0)

## 2022-10-04 MED ORDER — ACETAMINOPHEN 325 MG PO TABS
650.0000 mg | ORAL_TABLET | Freq: Once | ORAL | Status: AC
  Administered 2022-10-04: 650 mg via ORAL
  Filled 2022-10-04: qty 2

## 2022-10-04 MED ORDER — MORPHINE SULFATE (PF) 4 MG/ML IV SOLN: 4.0000 mg | Freq: Once | INTRAVENOUS | Status: DC

## 2022-10-04 MED ORDER — MAGNESIUM SULFATE 2 GM/50ML IV SOLN
2.0000 g | Freq: Once | INTRAVENOUS | Status: AC
  Administered 2022-10-04: 2 g via INTRAVENOUS
  Filled 2022-10-04: qty 50

## 2022-10-04 NOTE — ED Triage Notes (Signed)
Pt from home reports sudden onset chest pain 0230, pt took 3 nitros at home 324 ASA no relief. EMS gave 1 nitro no relief. 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 Pt had Stent placed on July 07, 2022.

## 2022-10-04 NOTE — ED Provider Notes (Signed)
[facility] Provider Note    Event Date/Time   First MD Initiated Contact with Patient 10/04/22 [phone]     (approximate)   History   Chest Pain   HPI  Jessica Wheeler is a 82 y.o. female with a history of CAD, CHF, diabetes, hypertension, and hyperlipidemia who presents with chest pain, acute onset at 2:30 AM, persistent since then, described as a sensation of pressure or squeezing.  It is mainly on the left side of her chest and up into her neck.  She denies associated shortness of breath but does feel somewhat lightheaded and dizzy especially after taking nitroglycerin.  The patient took 3 nitroglycerin sublingual tablets prior to coming to the hospital.  EMS gave her an additional nitro and 324 mg of aspirin with no relief.  The patient states she has had similar pain multiple times previously.  She saw her cardiologist Dr. Juliann Pares last week.  I reviewed the past medical records.  I saw this patient in the ED on 5/26 with chest pain.  She had negative enzymes at that time and was discharged home.  She followed up with Dr. Juliann Pares on 5/31.  She is scheduled for a cardiac MRI stress test and he also started her on diltiazem, however she states she just filled the prescription yesterday evening and has not yet taken it.   Physical Exam   Triage Vital Signs: ED Triage Vitals  Enc Vitals Group     BP --      Pulse Rate 10/04/22 0452 88     Resp 10/04/22 0452 16     Temp --      Temp src --      SpO2 10/04/22 0452 97 %     Weight 10/04/22 0450 125 lb 10.6 oz (57 kg)     Height 10/04/22 0450 5\' 3"  (1.6 m)     Head Circumference --      Peak Flow --      Pain Score 10/04/22 0450 7     Pain Loc --      Pain Edu? --      Excl. in GC? --     Most recent vital signs: Vitals:   10/04/22 0500 10/04/22 0529  BP: (!) 151/66   Pulse: 82   Resp: (!) 21   Temp:  98.7 F (37.1 C)  SpO2: 98%      General: Awake, relatively well-appearing, no distress.   CV:  Good peripheral perfusion.  Normal heart sounds. Resp:  Normal effort.  Lungs CTAB. Abd:  No distention.  Other:  No peripheral edema.   ED Results / Procedures / Treatments   Labs (all labs ordered are listed, but only abnormal results are displayed) Labs Reviewed  BASIC METABOLIC PANEL - Abnormal; Notable for the following components:      Result Value   Glucose, Bld 170 (*)    BUN 27 (*)    All other components within normal limits  CBC WITH DIFFERENTIAL/PLATELET - Abnormal; Notable for the following components:   RBC 3.67 (*)    Hemoglobin 10.3 (*)    HCT 31.3 (*)    RDW 16.4 (*)    All other components within normal limits  BRAIN NATRIURETIC PEPTIDE - Abnormal; Notable for the following components:   B Natriuretic Peptide 193.3 (*)    All other components within normal limits  MAGNESIUM - Abnormal; Notable for the following components:   Magnesium 1.4 (*)  All other components within normal limits  TROPONIN I (HIGH SENSITIVITY) - Abnormal; Notable for the following components:   Troponin I (High Sensitivity) 22 (*)    All other components within normal limits  TROPONIN I (HIGH SENSITIVITY)     EKG  ED ECG REPORT I, [name], the attending physician, personally viewed and interpreted this ECG.  Date: 10/04/2022 EKG Time: 0455 Rate: 84 Rhythm: normal sinus rhythm QRS Axis: normal Intervals: RBBB, LPFB ST/T Wave abnormalities: normal Narrative Interpretation: no evidence of acute ischemia; no significant change when compared to EKG of 09/25/2022    RADIOLOGY  Chest x-ray: I independently viewed and interpreted the images; there is no focal consolidation or edema   PROCEDURES:  Critical Care performed: No  Procedures   MEDICATIONS ORDERED IN ED: Medications  magnesium sulfate IVPB 2 g 50 mL (2 g Intravenous New Bag/Given 10/04/22 8295)  acetaminophen (TYLENOL) tablet 650 mg (650 mg Oral Given 10/04/22 0459)     IMPRESSION / MDM /  ASSESSMENT AND PLAN / ED COURSE  I reviewed the triage vital signs and the nursing notes.  82 year old female with PMH as noted above presents with chest pain since 2:30 AM.  She received nitroglycerin and aspirin with no significant relief.  Exam shows no significant acute findings.  EKG is nonischemic.  Differential diagnosis includes, but is not limited to, ACS, musculoskeletal pain, GERD.  There is no clinical evidence for aortic dissection or other vascular etiology.  I do not suspect PE given the lack of tachycardia or hypoxia and history of multiple prior episodes of similar pain.  We will obtain lab workup including cardiac enzymes, chest x-ray, and reassess.  Patient's presentation is most consistent with acute presentation with potential threat to life or bodily function.  The patient is on the cardiac monitor to evaluate for evidence of arrhythmia and/or significant heart rate changes.  ----------------------------------------- 7:02 AM on 10/04/2022 -----------------------------------------  Initial troponin is at the patient's baseline, as is her BNP.  Patient reports a recent history of low magnesium so I checked it and it is 1.4 today.  I have ordered IV magnesium to replete.  On reassessment the patient has minimal chest discomfort at this time.  She appears well.  Given her elevated CAD risk she will need a second troponin.  I anticipate discharge home if this is negative.  I have signed her out to the oncoming ED physician Dr. Cyril Loosen.     FINAL CLINICAL IMPRESSION(S) / ED DIAGNOSES   Final diagnoses:  Nonspecific chest pain  Hypomagnesemia     Rx / DC Orders   ED Discharge Orders     None        Note:  This document was prepared using Dragon voice recognition software and may include unintentional dictation errors.    [name], MD 10/04/22 [phone]

## 2022-10-04 NOTE — ED Provider Notes (Signed)
Second troponin improved from prior, patient is feeling well, no indication for admission at this time, appropriate for discharge with follow-up with her cardiologist.   Jene Every, MD 10/04/22 [phone]

## 2022-10-05 ENCOUNTER — Other Ambulatory Visit: Payer: Self-pay | Admitting: Family Medicine

## 2022-10-06 ENCOUNTER — Other Ambulatory Visit: Payer: Self-pay | Admitting: Family Medicine

## 2022-10-07 ENCOUNTER — Telehealth: Payer: Self-pay

## 2022-10-07 MED ORDER — COENZYME Q10 30 MG PO CAPS: 100.0000 mg | ORAL_CAPSULE | Freq: Two times a day (BID) | ORAL | 6 refills | Status: DC

## 2022-10-07 NOTE — Telephone Encounter (Signed)
Fax received from [facility] medication changes were made: Stop Corlanor. Start COQ10 200mg  daily for muscle cramps. Stated patient told [facility] she stopped Pantoprazole on her own.  Will route to [place] for FYI.

## 2022-10-10 ENCOUNTER — Telehealth: Payer: Self-pay

## 2022-10-10 NOTE — Telephone Encounter (Signed)
Transition Care Management Unsuccessful Follow-up Telephone Call  Date of discharge and from where:  [place] 6/4  Attempts:  1st Attempt  Reason for unsuccessful TCM follow-up call:  Left voice message   Lenard Forth [facility] Guide, [facility] [phone] [address] [place], [place], [place] [address] Phone: [phone] Email: [email or web address]

## 2022-10-11 ENCOUNTER — Other Ambulatory Visit: Payer: Medicare HMO

## 2022-10-11 ENCOUNTER — Telehealth: Payer: Self-pay

## 2022-10-11 NOTE — Telephone Encounter (Signed)
Transition Care Management Unsuccessful Follow-up Telephone Call  Date of discharge and from where:  [place] 6/5  Attempts:  2nd Attempt  Reason for unsuccessful TCM follow-up call:  Left voice message   Lenard Forth [facility] Guide, [facility] [phone] [address] [place], [place], [place] [address] Phone: [phone] Email: [email or web address]

## 2022-10-12 DIAGNOSIS — I509 Heart failure, unspecified: Secondary | ICD-10-CM | POA: Diagnosis not present

## 2022-10-12 DIAGNOSIS — I5020 Unspecified systolic (congestive) heart failure: Secondary | ICD-10-CM | POA: Diagnosis not present

## 2022-10-12 DIAGNOSIS — R252 Cramp and spasm: Secondary | ICD-10-CM | POA: Diagnosis not present

## 2022-10-19 LAB — LAB REPORT - SCANNED: EGFR: 48

## 2022-10-20 ENCOUNTER — Ambulatory Visit: Payer: Medicare HMO | Admitting: Cardiology

## 2022-10-20 DIAGNOSIS — R079 Chest pain, unspecified: Secondary | ICD-10-CM | POA: Diagnosis not present

## 2022-10-20 DIAGNOSIS — I10 Essential (primary) hypertension: Secondary | ICD-10-CM | POA: Diagnosis not present

## 2022-10-20 DIAGNOSIS — E7800 Pure hypercholesterolemia, unspecified: Secondary | ICD-10-CM | POA: Diagnosis not present

## 2022-10-20 DIAGNOSIS — E612 Magnesium deficiency: Secondary | ICD-10-CM | POA: Diagnosis not present

## 2022-10-20 DIAGNOSIS — I2510 Atherosclerotic heart disease of native coronary artery without angina pectoris: Secondary | ICD-10-CM | POA: Diagnosis not present

## 2022-10-21 ENCOUNTER — Encounter: Payer: Self-pay | Admitting: Physician Assistant

## 2022-10-21 ENCOUNTER — Ambulatory Visit: Payer: Self-pay

## 2022-10-21 ENCOUNTER — Ambulatory Visit (INDEPENDENT_AMBULATORY_CARE_PROVIDER_SITE_OTHER): Payer: Medicare HMO | Admitting: Physician Assistant

## 2022-10-21 VITALS — BP 136/66 | HR 79 | Temp 98.3°F | Wt 126.6 lb

## 2022-10-21 DIAGNOSIS — R21 Rash and other nonspecific skin eruption: Secondary | ICD-10-CM

## 2022-10-21 DIAGNOSIS — K1370 Unspecified lesions of oral mucosa: Secondary | ICD-10-CM | POA: Diagnosis not present

## 2022-10-21 MED ORDER — NYSTATIN 100000 UNIT/ML MT SUSP
5.0000 mL | Freq: Three times a day (TID) | OROMUCOSAL | 0 refills | Status: DC | PRN
Start: 2022-10-21 — End: 2022-10-31

## 2022-10-21 NOTE — Telephone Encounter (Addendum)
Chief Complaint: upper lip swollen and rash  from side to side of waist Symptoms: mild burning to rash, red and spots noted, mild itching, upper lip swollen with mild tenderness Frequency: woke this am sx Pertinent Negatives: Patient denies tongue swelling or difficulty swallowing, talking or breathing, no other parts of face swllen. Disposition: [] ED /[] Urgent Care (no appt availability in office) / [x] Appointment(In office/virtual)/ []  [facility]/ [] Home Care/ [] Refused Recommended Disposition /[] [facility]/ []  Follow-up with PCP Additional Notes: can pt be fit in for appt. Please call pt before 1030 to let her know. No appts noted.-- Addendum: Shortly after call ended, called pt back and offered her an appt for Erin Mecum PA at [facility]. Address of [facility] given and pt agrees to appt.  Reason for Disposition  Mild lip swelling of unknown cause  [1] Shingles rash (matches SYMPTOMS) AND [2] onset < 72 hours ago (3 days)  Answer Assessment - Initial Assessment Questions 1. ONSET: "When did the swelling start?" (e.g., minutes, hours, days)     Last  2. LOCATION: "What part of the face is swollen?"   Top lips  3. SEVERITY: "How swollen is it?"    moderately 4. ITCHING: "Is there any itching?" If Yes, ask: "How much?"   (Scale 1-10; mild, moderate or severe)     Itching around waistline red bumps not blister  5. PAIN: "Is the swelling painful to touch?" If Yes, ask: "How painful is it?"   (Scale 1-10; mild, moderate or severe)   - NONE (0): no pain   - MILD (1-3): doesn't interfere with normal activities    - MODERATE (4-7): interferes with normal activities or awakens from sleep    - SEVERE (8-10): excruciating pain, unable to do any normal activities      mild 6. FEVER: "Do you have a fever?" If Yes, ask: "What is it, how was it measured, and when did it start?"      no 9. OTHER SYMPTOMS: "Do you have any other symptoms?" (e.g., toothache, leg swelling)     Itching rash  around waist  Answer Assessment - Initial Assessment Questions 1. ONSET: "When did the swelling start?" (e.g., minutes, hours, days)     Last night  2. SEVERITY: "How swollen is it?"     Noticeable  3. ITCHING: "Is there any itching?" If Yes, ask: "How much?"   (Scale 1-10; mild, moderate or severe)     Mild  4. PAIN: "Is the swelling painful to touch?" If Yes, ask: "How painful is it?"   (Scale 1-10; mild, moderate or severe)     no 5. CAUSE: "What do you think is causing the lip swelling?"     Unsure   7. OTHER SYMPTOMS: "Do you have any other symptoms?" (e.g., toothache)     Rash around waist - looks like shingles  Answer Assessment - Initial Assessment Questions 1. APPEARANCE of RASH: "Describe the rash."      Red with smal spots  2. LOCATION: "Where is the rash located?"      From side to side  5. ONSET: "When did the rash start?"      Today  6. ITCHING: "Does the rash itch?" If Yes, ask: "How bad is the itch?"  (Scale 0-10; or none, mild, moderate, severe)     mild 7. PAIN: "Does the rash hurt?" If Yes, ask: "How bad is the pain?"  (Scale 0-10; or none, mild, moderate, severe)    -  NONE (0): no pain    - MILD (1-3): doesn't interfere with normal activities     - MODERATE (4-7): interferes with normal activities or awakens from sleep     - SEVERE (8-10): excruciating pain, unable to do any normal activities     Mild burning sensation 8. OTHER SYMPTOMS: "Do you have any other symptoms?" (e.g., fever)     Upper lip swelling  Answer Assessment - Initial Assessment Questions 1. APPEARANCE of RASH: "Describe the rash."      Red with red spots  2. LOCATION: "Where is the rash located?"      From side to side 3. ONSET: "When did the rash start?"      Today  4. ITCHING: "Does the rash itch?" If Yes, ask: "How bad is the itch?"  (Scale 1-10; or mild, moderate, severe)     mild 5. PAIN: "Does the rash hurt?" If Yes, ask: "How bad is the pain?"  (Scale 0-10; or none, mild,  moderate, severe)    - NONE (0): no pain    - MILD (1-3): doesn't interfere with normal activities     - MODERATE (4-7): interferes with normal activities or awakens from sleep     - SEVERE (8-10): excruciating pain, unable to do any normal activities     Mild burning pain 6. OTHER SYMPTOMS: "Do you have any other symptoms?" (e.g., fever)     Upper lip swelling  Protocols used: Face Swelling-A-AH, Lip Swelling-A-AH, Rash or Redness - Localized-A-AH, Shingles (Zoster)-A-AH

## 2022-10-21 NOTE — Progress Notes (Unsigned)
Acute Office Visit   Patient: Jessica Wheeler   DOB: 17-Apr-1941   83 y.o. Female  MRN: [id number] Visit Date: 10/21/2022  Today's healthcare provider: Oswaldo Conroy Jakorian Marengo, PA-C  Introduced myself to the patient as a Secondary school teacher and provided education on APPs in clinical practice.    Chief Complaint  Patient presents with   Rash    Pt states she first noticed a rash on her abdomen this morning after going out for a walk. States the area does itch and burn. States she has had shingles 3 times in the past.    Oral Swelling    Pt states she has been having lip swelling since the middle of the night.    Subjective    HPI HPI     Rash    Additional comments: Pt states she first noticed a rash on her abdomen this morning after going out for a walk. States the area does itch and burn. States she has had shingles 3 times in the past.         Oral Swelling    Additional comments: Pt states she has been having lip swelling since the middle of the night.       Last edited by Pablo Ledger, CMA on 10/21/2022 10:28 AM.       Addison Bailey Duration:   started today    Location: trunk  Itching: yes Burning: yes Redness: yes Oozing: no Scaling: no Blisters: no Painful: no Fevers: no Change in detergents/soaps/personal care products: no Recent illness: no Recent travel:no History of same: no Context:  new onset since this AM  Alleviating factors: nothing Treatments attempted:nothing Shortness of breath: no  Throat/tongue swelling: no Myalgias/arthralgias: no    She reports she woke up this morning and felt like her  upper lip was swelling and burning a bit   She denies new foods, new clothing, soaps, topicals  She has reduced her Atorvastatin dose in half- last night was the first time she did this   Medications: Outpatient Medications Prior to Visit  Medication Sig   Accu-Chek FastClix Lancets MISC TEST FASTING BLOOD SUGAR EVERY DAY   aspirin 81 MG EC tablet Take 81 mg  by mouth daily.   atorvastatin (LIPITOR) 80 MG tablet Take 1 tablet (80 mg total) by mouth at bedtime. (Patient taking differently: Take 40 mg by mouth at bedtime.)   Blood Glucose Monitoring Suppl (ACCU-CHEK GUIDE) w/Device KIT USE AS DIRECTED TO CHECK FASTING BLOOD SUGAR DAILY   clopidogrel (PLAVIX) 75 MG tablet Take 1 tablet (75 mg total) by mouth daily.   diltiazem (CARDIZEM CD) 120 MG 24 hr capsule Take 120 mg by mouth 2 (two) times daily.   glucose blood (ACCU-CHEK GUIDE) test strip TEST FASTING BLOOD SUGAR EVERY DAY AS DIRECTED   Lancets Misc. (ACCU-CHEK FASTCLIX LANCET) KIT 1 each by Does not apply route as directed.   latanoprost (XALATAN) 0.005 % ophthalmic solution Place 1 drop into both eyes at bedtime.    losartan (COZAAR) 25 MG tablet TAKE 1 TABLET EVERY DAY   Magnesium 250 MG TABS Take 250 mg by mouth 3 (three) times daily.   metFORMIN (GLUCOPHAGE) 500 MG tablet TAKE 2 TABLETS TWICE DAILY WITH MEALS   nitroGLYCERIN (NITROSTAT) 0.4 MG SL tablet Place 1 tablet (0.4 mg total) under the tongue every 5 (five) minutes as needed for chest pain.   potassium chloride SA (KLOR-CON M) 20 MEQ tablet Take 20 mEq  by mouth daily.   potassium chloride SA (KLOR-CON M20) 20 MEQ tablet TAKE 1 TABLET BY MOUTH TWICE DAILY. TAKE 1 TABLET DAILY   torsemide (DEMADEX) 20 MG tablet Take 20 mg by mouth daily.   vitamin B-12 (CYANOCOBALAMIN) 1000 MCG tablet Take 1,000 mcg by mouth daily.   cyclobenzaprine (FLEXERIL) 5 MG tablet Take ONE tab PO BID/TID PRN (Patient not taking: Reported on 10/21/2022)   [DISCONTINUED] co-enzyme Q-10 30 MG capsule Take 3 capsules (90 mg total) by mouth 2 (two) times daily.   [DISCONTINUED] furosemide (LASIX) 20 MG tablet Take 1 tablet (20 mg total) by mouth 2 (two) times daily.   Facility-Administered Medications Prior to Visit  Medication Dose Route Frequency Provider   ticagrelor Marden Noble) tablet    PRN End, Cristal Deer, MD    Review of Systems  Skin:  Positive for rash.     {Labs  Heme  Chem  Endocrine  Serology  Results Review (optional):23779}   Objective    BP 136/66   Pulse 79   Temp 98.3 F (36.8 C) (Oral)   Wt 126 lb 9.6 oz (57.4 kg)   SpO2 97%   BMI 22.43 kg/m  {Show previous vital signs (optional):23777}  Physical Exam Vitals reviewed.  Constitutional:      General: She is awake.     Appearance: Normal appearance. She is well-developed and well-groomed.  HENT:     Head: Normocephalic and atraumatic.     Mouth/Throat:     Lips: Pink. Lesions present.     Mouth: Mucous membranes are moist.     Pharynx: No oropharyngeal exudate or posterior oropharyngeal erythema.     Comments: Red - canker looking sore on inside of upper lip at area of concern  Eyes:     General: Lids are normal. Gaze aligned appropriately.  Pulmonary:     Effort: Pulmonary effort is normal.  Skin:    General: Skin is warm and dry.     Findings: Rash present. Rash is macular and papular. Rash is not crusting, nodular, purpuric, pustular, urticarial or vesicular.       Neurological:     General: No focal deficit present.     Mental Status: She is alert and oriented to person, place, and time.     GCS: GCS eye subscore is 4. GCS verbal subscore is 5. GCS motor subscore is 6.     Cranial Nerves: Cranial nerves 2-12 are intact. No dysarthria or facial asymmetry.     Comments: Comments: MENTAL STATUS: AAOx3, memory intact, fund of knowledge appropriate   LANG/SPEECH: Naming and repetition intact, fluent, no dysarthria, follows 3-step commands, answers questions appropriately     CRANIAL NERVES:   II: Pupils equal and reactive, no RAPD   III, IV, VI: EOM intact, no gaze preference or deviation, no nystagmus.   V: normal sensation in V1, V2, and V3 segments bilaterally   VII: no asymmetry, no nasolabial fold flattening   VIII: normal hearing to speech   IX, X: normal palatal elevation, no uvular deviation   XI: 5/5 head turn and 5/5 shoulder shrug  bilaterally   XII: midline tongue protrusion   MOTOR:  5/5 bilateral grip strength 5/5 strength dorsiflexion/plantarflexion b/l   SENSORY:  Normal to light touch Romberg absent   COORD: Normal finger to nose and heel to shin, no tremor, no dysmetria   STATION: normal stance, no truncal ataxia   GAIT: Normal; patient able to tip-toe, heel-walk.   Psychiatric:  Behavior: Behavior is cooperative.       No results found for any visits on 10/21/22.  Assessment & Plan      No follow-ups on file.

## 2022-10-21 NOTE — Telephone Encounter (Signed)
Patient scheduled to see Erin at [facility] today as noted in below triage note.

## 2022-10-21 NOTE — Patient Instructions (Addendum)
   I recommend taking an antihistamine such as Claritin, Zyrtec or Allegra  I have sent in a script for a mouth wash to help with your swelling and burning  If your symptoms persist or get worse, please go to the ED for assistance

## 2022-10-25 DIAGNOSIS — R079 Chest pain, unspecified: Secondary | ICD-10-CM | POA: Diagnosis not present

## 2022-10-25 DIAGNOSIS — I2510 Atherosclerotic heart disease of native coronary artery without angina pectoris: Secondary | ICD-10-CM | POA: Diagnosis not present

## 2022-10-25 DIAGNOSIS — Z7984 Long term (current) use of oral hypoglycemic drugs: Secondary | ICD-10-CM | POA: Diagnosis not present

## 2022-10-25 DIAGNOSIS — I10 Essential (primary) hypertension: Secondary | ICD-10-CM | POA: Diagnosis not present

## 2022-10-25 DIAGNOSIS — Z961 Presence of intraocular lens: Secondary | ICD-10-CM | POA: Diagnosis not present

## 2022-10-25 DIAGNOSIS — Z955 Presence of coronary angioplasty implant and graft: Secondary | ICD-10-CM | POA: Diagnosis not present

## 2022-10-25 DIAGNOSIS — R000 Tachycardia, unspecified: Secondary | ICD-10-CM | POA: Diagnosis not present

## 2022-10-25 DIAGNOSIS — R0602 Shortness of breath: Secondary | ICD-10-CM | POA: Diagnosis not present

## 2022-10-25 DIAGNOSIS — R42 Dizziness and giddiness: Secondary | ICD-10-CM | POA: Diagnosis not present

## 2022-10-25 DIAGNOSIS — H401131 Primary open-angle glaucoma, bilateral, mild stage: Secondary | ICD-10-CM | POA: Diagnosis not present

## 2022-10-25 DIAGNOSIS — H353132 Nonexudative age-related macular degeneration, bilateral, intermediate dry stage: Secondary | ICD-10-CM | POA: Diagnosis not present

## 2022-10-25 DIAGNOSIS — I252 Old myocardial infarction: Secondary | ICD-10-CM | POA: Diagnosis not present

## 2022-10-25 DIAGNOSIS — H43813 Vitreous degeneration, bilateral: Secondary | ICD-10-CM | POA: Diagnosis not present

## 2022-10-25 DIAGNOSIS — E119 Type 2 diabetes mellitus without complications: Secondary | ICD-10-CM | POA: Diagnosis not present

## 2022-10-27 DIAGNOSIS — L309 Dermatitis, unspecified: Secondary | ICD-10-CM | POA: Diagnosis not present

## 2022-10-28 NOTE — Progress Notes (Unsigned)
I,Reagan Klemz S Hayley Horn,acting as a Neurosurgeon for Shirlee Latch, MD.,have documented all relevant documentation on the behalf of Shirlee Latch, MD,as directed by  Shirlee Latch, MD while in the presence of Shirlee Latch, MD.      Established patient visit   Patient: Jessica Wheeler   DOB: Jun 13, 1940   82 y.o. Female  MRN: [id number] Visit Date: 10/31/2022  Today's healthcare provider: Shirlee Latch, MD   Chief Complaint  Patient presents with   Medical Management of Chronic Issues   Subjective    HPI  Diabetes Mellitus Type II, Follow-up  Lab Results  Component Value Date   HGBA1C 7.2 (A) 10/31/2022   HGBA1C 7.1 (H) 07/07/2022   HGBA1C 7.1 (A) 01/20/2022   Wt Readings from Last 3 Encounters:  10/31/22 125 lb 12.8 oz (57.1 kg)  10/21/22 126 lb 9.6 oz (57.4 kg)  10/04/22 125 lb 10.6 oz (57 kg)   Last seen for diabetes 3 months ago.  Management since then includes no changes. She reports excellent compliance with treatment. She is not having side effects.   Home blood sugar records: fasting range: 130s  Episodes of hypoglycemia? No    Current insulin regiment: none Most Recent Eye Exam: UTD  Pertinent Labs: Lab Results  Component Value Date   CHOL 197 07/08/2022   HDL 103 07/08/2022   LDLCALC 82 07/08/2022   TRIG 62 07/08/2022   CHOLHDL 1.9 07/08/2022   Lab Results  Component Value Date   NA 138 10/04/2022   K 3.8 10/04/2022   CREATININE 0.87 10/04/2022   GFRNONAA >60 10/04/2022   MICRALBCREAT 11 08/09/2022     ---------------------------------------------------------------------------------------------------  Discussed the use of AI scribe software for clinical note transcription with the patient, who gave verbal consent to proceed.  History of Present Illness   The patient, with a history of heart failure, diabetes, and high cholesterol, presents with concerns about her current medications and their effects. She reports difficulty with  fluid retention, muscle weakness, and a rash. She has been seeing multiple doctors and expresses frustration with the lack of coordination and communication.  The patient reports that she has been experiencing muscle weakness, particularly in the mornings, which she attributes to her atorvastatin medication. She has stopped taking this medication temporarily to see if her muscle strength improves. She also reports having to take an extra half pill of torsemide to manage fluid retention, but is concerned about the potential for dehydration.  The patient also mentions a rash, which has been diagnosed as eczema by a dermatologist. She believes this rash is stress-related. She also expresses concern about her blood pressure and cholesterol levels, and the impact of her medications on these.  The patient is particularly frustrated with the impact of her health issues and medications on her ability to exercise, which she sees as crucial for her mental and physical health. She expresses a strong desire to get back to her regular walking routine.       Medications: Outpatient Medications Prior to Visit  Medication Sig   Accu-Chek FastClix Lancets MISC TEST FASTING BLOOD SUGAR EVERY DAY   aspirin 81 MG EC tablet Take 81 mg by mouth daily.   Blood Glucose Monitoring Suppl (ACCU-CHEK GUIDE) w/Device KIT USE AS DIRECTED TO CHECK FASTING BLOOD SUGAR DAILY   cyclobenzaprine (FLEXERIL) 5 MG tablet    glucose blood (ACCU-CHEK GUIDE) test strip TEST FASTING BLOOD SUGAR EVERY DAY AS DIRECTED   Lancets Misc. (ACCU-CHEK FASTCLIX LANCET) KIT 1 each by  Does not apply route as directed.   latanoprost (XALATAN) 0.005 % ophthalmic solution Place 1 drop into both eyes at bedtime.    Magnesium 250 MG TABS Take 250 mg by mouth 3 (three) times daily.   potassium chloride SA (KLOR-CON M) 20 MEQ tablet Take 20 mEq by mouth daily.   vitamin B-12 (CYANOCOBALAMIN) 1000 MCG tablet Take 1,000 mcg by mouth daily.   [DISCONTINUED]  clopidogrel (PLAVIX) 75 MG tablet Take 1 tablet (75 mg total) by mouth daily.   [DISCONTINUED] diltiazem (CARDIZEM CD) 120 MG 24 hr capsule Take 120 mg by mouth 2 (two) times daily.   [DISCONTINUED] losartan (COZAAR) 25 MG tablet TAKE 1 TABLET EVERY DAY   [DISCONTINUED] metFORMIN (GLUCOPHAGE) 500 MG tablet TAKE 2 TABLETS TWICE DAILY WITH MEALS   [DISCONTINUED] nitroGLYCERIN (NITROSTAT) 0.4 MG SL tablet Place 1 tablet (0.4 mg total) under the tongue every 5 (five) minutes as needed for chest pain.   [DISCONTINUED] potassium chloride SA (KLOR-CON M20) 20 MEQ tablet TAKE 1 TABLET BY MOUTH TWICE DAILY. TAKE 1 TABLET DAILY   atorvastatin (LIPITOR) 80 MG tablet Take 1 tablet (80 mg total) by mouth at bedtime. (Patient taking differently: Take 40 mg by mouth at bedtime.)   [DISCONTINUED] magic mouthwash (nystatin, lidocaine, diphenhydrAMINE) suspension Take 5 mLs by mouth 3 (three) times daily as needed for mouth pain.   [DISCONTINUED] torsemide (DEMADEX) 20 MG tablet Take 20 mg by mouth daily. (Patient not taking: Reported on 10/31/2022)   Facility-Administered Medications Prior to Visit  Medication Dose Route Frequency Provider   ticagrelor (BRILINTA) tablet    PRN End, Cristal Deer, MD    Review of Systems per HPI     Objective    BP (!) 128/57 Comment: home reading  Pulse 63   Temp 97.6 F (36.4 C) (Temporal)   Resp 12   Ht 5\' 3"  (1.6 m)   Wt 125 lb 12.8 oz (57.1 kg)   SpO2 97%   BMI 22.28 kg/m    Physical Exam Vitals reviewed.  Constitutional:      General: She is not in acute distress.    Appearance: Normal appearance. She is well-developed. She is not diaphoretic.  HENT:     Head: Normocephalic and atraumatic.  Eyes:     General: No scleral icterus.    Conjunctiva/sclera: Conjunctivae normal.  Neck:     Thyroid: No thyromegaly.  Cardiovascular:     Rate and Rhythm: Normal rate and regular rhythm.     Heart sounds: Murmur heard.  Pulmonary:     Effort: Pulmonary effort is  normal. No respiratory distress.     Breath sounds: Normal breath sounds. No wheezing, rhonchi or rales.  Musculoskeletal:     Cervical back: Neck supple.     Right lower leg: No edema.     Left lower leg: No edema.  Lymphadenopathy:     Cervical: No cervical adenopathy.  Skin:    General: Skin is warm and dry.     Findings: No rash.  Neurological:     Mental Status: She is alert and oriented to person, place, and time. Mental status is at baseline.  Psychiatric:        Mood and Affect: Mood normal.        Behavior: Behavior normal.       Results for orders placed or performed in visit on 10/31/22  POCT glycosylated hemoglobin (Hb A1C)  Result Value Ref Range   Hemoglobin A1C 7.2 (A) 4.0 - 5.6 %  Est. average glucose Bld gHb Est-mCnc 160     Assessment & Plan     Problem List Items Addressed This Visit       Cardiovascular and Mediastinum   Hypertension associated with diabetes (HCC)    Well controlled on current regimen. Patient reports visual disturbances with diltiazem. -Continue current antihypertensive regimen. -Monitor for visual disturbances.      Relevant Medications   diltiazem (CARDIZEM CD) 120 MG 24 hr capsule   losartan (COZAAR) 25 MG tablet   metFORMIN (GLUCOPHAGE) 500 MG tablet   nitroGLYCERIN (NITROSTAT) 0.4 MG SL tablet   Other Relevant Orders   Comprehensive metabolic panel   Chronic systolic CHF (congestive heart failure) (HCC)    Fluid overload with weight gain and dyspnea. Patient self-adjusting torsemide dose with some improvement but also symptoms of dehydration. History of low magnesium contributing to chest pain. Ejection fraction improved from 32% to over 50% on recent echocardiogram. -Refer to heart failure cardiologist for further management. -Continue torsemide 20mg  daily, with additional 10mg  as needed for fluid overload. -Check electrolytes, magnesium, kidney and liver function today.      Relevant Medications   diltiazem (CARDIZEM  CD) 120 MG 24 hr capsule   losartan (COZAAR) 25 MG tablet   nitroGLYCERIN (NITROSTAT) 0.4 MG SL tablet   Other Relevant Orders   Ambulatory referral to Cardiology   Comprehensive metabolic panel     Endocrine   DM (diabetes mellitus) (HCC) - Primary    A1C 7.2, slightly above target. Patient motivated to improve control with exercise. -Continue metformin 500mg  twice daily. - did not tolerate jardiance previously -Encourage regular exercise. -Recheck A1C in 3 months.      Relevant Medications   losartan (COZAAR) 25 MG tablet   metFORMIN (GLUCOPHAGE) 500 MG tablet   Other Relevant Orders   POCT glycosylated hemoglobin (Hb A1C) (Completed)   Hyperlipidemia associated with type 2 diabetes mellitus (HCC)    Muscle weakness potentially related to atorvastatin. Currently held by another provider. -Hold atorvastatin for now as per other provider's recommendation. -Reevaluate lipid management at next visit.      Relevant Medications   diltiazem (CARDIZEM CD) 120 MG 24 hr capsule   losartan (COZAAR) 25 MG tablet   metFORMIN (GLUCOPHAGE) 500 MG tablet   nitroGLYCERIN (NITROSTAT) 0.4 MG SL tablet     Other   Myalgia due to statin   Acute blood loss anemia   Relevant Orders   CBC   Other Visit Diagnoses     Hypomagnesemia       Relevant Orders   Comprehensive metabolic panel   Magnesium            Eczema: Recent rash diagnosed as eczema by dermatologist, likely stress-related. -Continue management with dermatologist.  General Health Maintenance: -Transfer all prescriptions back to [facility] on [facility] [address] [facility]. -Follow-up in 3 months or sooner if needed.        Return in about 3 months (around 01/31/2023) for chronic disease f/u.      I, Shirlee Latch, MD, have reviewed all documentation for this visit. The documentation on 10/31/22 for the exam, diagnosis, procedures, and orders are all accurate and complete.   Bacigalupo, Marzella Schlein, MD, MPH [facility]

## 2022-10-31 ENCOUNTER — Encounter: Payer: Self-pay | Admitting: Family Medicine

## 2022-10-31 ENCOUNTER — Ambulatory Visit (INDEPENDENT_AMBULATORY_CARE_PROVIDER_SITE_OTHER): Payer: Medicare HMO | Admitting: Family Medicine

## 2022-10-31 ENCOUNTER — Telehealth: Payer: Self-pay

## 2022-10-31 VITALS — BP 128/57 | HR 63 | Temp 97.6°F | Resp 12 | Ht 63.0 in | Wt 125.8 lb

## 2022-10-31 DIAGNOSIS — T466X5A Adverse effect of antihyperlipidemic and antiarteriosclerotic drugs, initial encounter: Secondary | ICD-10-CM

## 2022-10-31 DIAGNOSIS — E1169 Type 2 diabetes mellitus with other specified complication: Secondary | ICD-10-CM

## 2022-10-31 DIAGNOSIS — E1159 Type 2 diabetes mellitus with other circulatory complications: Secondary | ICD-10-CM | POA: Diagnosis not present

## 2022-10-31 DIAGNOSIS — M791 Myalgia, unspecified site: Secondary | ICD-10-CM | POA: Diagnosis not present

## 2022-10-31 DIAGNOSIS — E785 Hyperlipidemia, unspecified: Secondary | ICD-10-CM

## 2022-10-31 DIAGNOSIS — D62 Acute posthemorrhagic anemia: Secondary | ICD-10-CM | POA: Diagnosis not present

## 2022-10-31 DIAGNOSIS — I152 Hypertension secondary to endocrine disorders: Secondary | ICD-10-CM

## 2022-10-31 DIAGNOSIS — I5022 Chronic systolic (congestive) heart failure: Secondary | ICD-10-CM

## 2022-10-31 LAB — POCT GLYCOSYLATED HEMOGLOBIN (HGB A1C)
Est. average glucose Bld gHb Est-mCnc: 160
Hemoglobin A1C: 7.2 % — AB (ref 4.0–5.6)

## 2022-10-31 MED ORDER — CLOPIDOGREL BISULFATE 75 MG PO TABS: 75.0000 mg | ORAL_TABLET | Freq: Every day | ORAL | 1 refills | Status: DC

## 2022-10-31 MED ORDER — NITROGLYCERIN 0.4 MG SL SUBL: 0.4000 mg | SUBLINGUAL_TABLET | SUBLINGUAL | 3 refills | Status: DC | PRN

## 2022-10-31 MED ORDER — METFORMIN HCL 500 MG PO TABS: 500.0000 mg | ORAL_TABLET | Freq: Two times a day (BID) | ORAL | 3 refills | Status: DC

## 2022-10-31 MED ORDER — LOSARTAN POTASSIUM 25 MG PO TABS: 25.0000 mg | ORAL_TABLET | Freq: Every day | ORAL | 3 refills | Status: DC

## 2022-10-31 MED ORDER — DILTIAZEM HCL ER COATED BEADS 120 MG PO CP24: 120.0000 mg | ORAL_CAPSULE | Freq: Two times a day (BID) | ORAL | 1 refills | Status: DC

## 2022-10-31 MED ORDER — POTASSIUM CHLORIDE CRYS ER 20 MEQ PO TBCR: 20.0000 meq | EXTENDED_RELEASE_TABLET | Freq: Every day | ORAL | 3 refills | Status: DC

## 2022-10-31 NOTE — Telephone Encounter (Signed)
Copied from CRM [id number]. Topic: Appointment Scheduling - Scheduling Inquiry for Clinic >> Oct 31, 2022 12:25 PM Marlow Baars wrote: Reason for CRM: The patient forgot to ask her provider this morning if it would be safe at this time to schedule her second shingles shot? Please assist patient further

## 2022-10-31 NOTE — Assessment & Plan Note (Signed)
Muscle weakness potentially related to atorvastatin. Currently held by another provider. -Hold atorvastatin for now as per other provider's recommendation. -Reevaluate lipid management at next visit.

## 2022-10-31 NOTE — Assessment & Plan Note (Signed)
Fluid overload with weight gain and dyspnea. Patient self-adjusting torsemide dose with some improvement but also symptoms of dehydration. History of low magnesium contributing to chest pain. Ejection fraction improved from 32% to over 50% on recent echocardiogram. -Refer to heart failure cardiologist for further management. -Continue torsemide 20mg  daily, with additional 10mg  as needed for fluid overload. -Check electrolytes, magnesium, kidney and liver function today.

## 2022-10-31 NOTE — Addendum Note (Signed)
Addended by: Erasmo Downer on: 10/31/2022 12:19 PM   Modules accepted: Level of Service

## 2022-10-31 NOTE — Assessment & Plan Note (Signed)
Well controlled on current regimen. Patient reports visual disturbances with diltiazem. -Continue current antihypertensive regimen. -Monitor for visual disturbances.

## 2022-10-31 NOTE — Telephone Encounter (Signed)
Yes. She is ok to schedule 2nd shingles shot

## 2022-10-31 NOTE — Assessment & Plan Note (Signed)
A1C 7.2, slightly above target. Patient motivated to improve control with exercise. -Continue metformin 500mg  twice daily. - did not tolerate jardiance previously -Encourage regular exercise. -Recheck A1C in 3 months.

## 2022-11-01 LAB — CBC
Hematocrit: 34.2 % (ref 34.0–46.6)
Hemoglobin: 10.8 g/dL — ABNORMAL LOW (ref 11.1–15.9)
MCH: 26.7 pg (ref 26.6–33.0)
MCHC: 31.6 g/dL (ref 31.5–35.7)
MCV: 84 fL (ref 79–97)
Platelets: 308 10*3/uL (ref 150–450)
RBC: 4.05 x10E6/uL (ref 3.77–5.28)
RDW: 15.9 % — ABNORMAL HIGH (ref 11.7–15.4)
WBC: 6.5 10*3/uL (ref 3.4–10.8)

## 2022-11-01 LAB — MAGNESIUM: Magnesium: 2.1 mg/dL (ref 1.6–2.3)

## 2022-11-01 LAB — COMPREHENSIVE METABOLIC PANEL
ALT: 32 IU/L (ref 0–32)
AST: 41 IU/L — ABNORMAL HIGH (ref 0–40)
Albumin: 4.3 g/dL (ref 3.7–4.7)
Alkaline Phosphatase: 81 IU/L (ref 44–121)
BUN/Creatinine Ratio: 17 (ref 12–28)
BUN: 16 mg/dL (ref 8–27)
Bilirubin Total: 0.4 mg/dL (ref 0.0–1.2)
CO2: 24 mmol/L (ref 20–29)
Calcium: 9.9 mg/dL (ref 8.7–10.3)
Chloride: 97 mmol/L (ref 96–106)
Creatinine, Ser: 0.92 mg/dL (ref 0.57–1.00)
Globulin, Total: 2.9 g/dL (ref 1.5–4.5)
Glucose: 159 mg/dL — ABNORMAL HIGH (ref 70–99)
Potassium: 5.1 mmol/L (ref 3.5–5.2)
Sodium: 136 mmol/L (ref 134–144)
Total Protein: 7.2 g/dL (ref 6.0–8.5)
eGFR: 62 mL/min/{1.73_m2} (ref >59)

## 2022-11-01 NOTE — Telephone Encounter (Signed)
Patient advised.

## 2022-11-09 ENCOUNTER — Encounter: Payer: Self-pay | Admitting: Physician Assistant

## 2022-11-09 ENCOUNTER — Ambulatory Visit (INDEPENDENT_AMBULATORY_CARE_PROVIDER_SITE_OTHER): Payer: Medicare HMO | Admitting: Physician Assistant

## 2022-11-09 VITALS — BP 119/65 | HR 86 | Resp 14 | Ht 63.0 in | Wt 126.0 lb

## 2022-11-09 DIAGNOSIS — R300 Dysuria: Secondary | ICD-10-CM

## 2022-11-09 DIAGNOSIS — N3001 Acute cystitis with hematuria: Secondary | ICD-10-CM | POA: Diagnosis not present

## 2022-11-09 DIAGNOSIS — N3000 Acute cystitis without hematuria: Secondary | ICD-10-CM

## 2022-11-09 LAB — POCT URINALYSIS DIPSTICK
Bilirubin, UA: NEGATIVE
Glucose, UA: NEGATIVE
Ketones, UA: NEGATIVE
Nitrite, UA: NEGATIVE
Protein, UA: NEGATIVE
Spec Grav, UA: 1.01 (ref 1.010–1.025)
Urobilinogen, UA: 0.2 E.U./dL
pH, UA: 7 (ref 5.0–8.0)

## 2022-11-09 NOTE — Progress Notes (Signed)
Established patient visit  Patient: Jessica Wheeler   DOB: 11-Sep-1940   82 y.o. Female  MRN: [id number] Visit Date: 11/09/2022  Today's healthcare provider: Debera Lat, PA-C   Chief Complaint  Patient presents with   Dysuria   Subjective     HPI   Patient states there has been a change in odor, there has been burning while urinating and frequency. States the symptoms started yesterday. Last edited by Lubertha Basque, CMA on 11/09/2022 10:46 AM.      Discussed the use of AI scribe software for clinical note transcription with the patient, who gave verbal consent to proceed.  History of Present Illness   The patient, with a history of heart attack, aortic dissection, and diabetes, presents with burning urination that started yesterday. She has previously used Uristat to manage similar symptoms, but is unable to do so now due to a risk of stomach bleeding with her current medication, Plavix. She suspects she may have a urinary tract infection (UTI). She also mentions seeing some blood at the beginning of urination, but is unsure.  The patient has a history of severe recurrent UTIs, which were previously investigated by a urologist. 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  The patient also has a skin allergy and has had multiple changes to her heart medication. However, she expresses concern about balancing her fluid intake due to her heart condition.           11/09/2022   11:02 AM 10/31/2022    8:12 AM 10/21/2022   10:38 AM  Depression screen PHQ 2/9  Decreased Interest 1 1 1   Down, Depressed, Hopeless 0 1 0  PHQ - 2 Score 1 2 1   Altered sleeping 1 1 1   Tired, decreased energy 1 1 2   Change in appetite 1 1 1   Feeling bad or failure about yourself   1 0  Trouble concentrating 0 0 0  Moving slowly or fidgety/restless 0 0 0  Suicidal thoughts 0 0 0  PHQ-9 Score 4 6 5    Difficult doing work/chores Not difficult at all Not difficult at all Somewhat difficult      10/21/2022   10:38 AM  GAD 7 : Generalized Anxiety Score  Nervous, Anxious, on Edge 1  Control/stop worrying 1  Worry too much - different things 1  Trouble relaxing 1  Restless 1  Easily annoyed or irritable 1  Afraid - awful might happen 1  Total GAD 7 Score 7  Anxiety Difficulty Somewhat difficult    Medications: Outpatient Medications Prior to Visit  Medication Sig   Accu-Chek FastClix Lancets MISC TEST FASTING BLOOD SUGAR EVERY DAY   aspirin 81 MG EC tablet Take 81 mg by mouth daily.   Blood Glucose Monitoring Suppl (ACCU-CHEK GUIDE) w/Device KIT USE AS DIRECTED TO CHECK FASTING BLOOD SUGAR DAILY   clopidogrel (PLAVIX) 75 MG tablet Take 1 tablet (75 mg total) by mouth daily.   cyclobenzaprine (FLEXERIL) 5 MG tablet    diltiazem (CARDIZEM CD) 120 MG 24 hr capsule Take 1 capsule (120 mg total) by mouth 2 (two) times daily.   glucose blood (ACCU-CHEK GUIDE) test strip TEST FASTING BLOOD SUGAR EVERY DAY AS DIRECTED   Lancets Misc. (ACCU-CHEK FASTCLIX LANCET) KIT 1 each by Does not apply route as directed.   latanoprost (XALATAN) 0.005 % ophthalmic solution Place 1 drop into both eyes at bedtime.  losartan (COZAAR) 25 MG tablet Take 1 tablet (25 mg total) by mouth daily.   Magnesium 250 MG TABS Take 250 mg by mouth 3 (three) times daily.   metFORMIN (GLUCOPHAGE) 500 MG tablet Take 1 tablet (500 mg total) by mouth 2 (two) times daily with a meal.   nitroGLYCERIN (NITROSTAT) 0.4 MG SL tablet Place 1 tablet (0.4 mg total) under the tongue every 5 (five) minutes as needed for chest pain.   potassium chloride SA (KLOR-CON M20) 20 MEQ tablet Take 1 tablet (20 mEq total) by mouth daily.   vitamin B-12 (CYANOCOBALAMIN) 1000 MCG tablet Take 1,000 mcg by mouth daily.   atorvastatin (LIPITOR) 80 MG tablet Take 1 tablet (80 mg total) by mouth at bedtime. (Patient taking differently: Take 40 mg by  mouth at bedtime.)   potassium chloride SA (KLOR-CON M) 20 MEQ tablet Take 20 mEq by mouth daily.   Facility-Administered Medications Prior to Visit  Medication Dose Route Frequency Provider   ticagrelor Marden Noble) tablet    PRN End, Cristal Deer, MD    Review of Systems  All other systems reviewed and are negative.  Except see HPI      Objective    BP 119/65 (BP Location: Right Arm, Patient Position: Sitting, Cuff Size: Normal)   Pulse 86   Resp 14   Ht 5\' 3"  (1.6 m)   Wt 126 lb (57.2 kg)   SpO2 97%   BMI 22.32 kg/m    Physical Exam Vitals reviewed.  Constitutional:      General: She is not in acute distress.    Appearance: She is well-developed.  HENT:     Head: Normocephalic and atraumatic.  Eyes:     General: No scleral icterus.    Conjunctiva/sclera: Conjunctivae normal.  Cardiovascular:     Rate and Rhythm: Normal rate and regular rhythm.     Heart sounds: Normal heart sounds. No murmur heard. Pulmonary:     Effort: Pulmonary effort is normal. No respiratory distress.     Breath sounds: Normal breath sounds. No wheezing or rales.  Abdominal:     General: There is no distension.     Palpations: Abdomen is soft.     Tenderness: There is abdominal tenderness in the suprapubic area. There is no guarding or rebound.  Skin:    General: Skin is warm and dry.     Capillary Refill: Capillary refill takes less than 2 seconds.     Findings: No rash.  Neurological:     Mental Status: She is alert and oriented to person, place, and time.  Psychiatric:        Behavior: Behavior normal.      Results for orders placed or performed in visit on 11/09/22  POCT Urinalysis Dipstick  Result Value Ref Range   Color, UA Pale Yellow    Clarity, UA Clear    Glucose, UA Negative Negative   Bilirubin, UA Negative    Ketones, UA Negative    Spec Grav, UA 1.010 1.010 - 1.025   Blood, UA Trace    pH, UA 7.0 5.0 - 8.0   Protein, UA Negative Negative   Urobilinogen, UA 0.2  0.2 or 1.0 E.U./dL   Nitrite, UA Negative    Leukocytes, UA Small (1+) (A) Negative   Appearance Clear    Odor      Assessment & Plan        Possible Urinary Tract Infection: Burning urination, increased frequency, and possible hematuria. Unable to take usual  treatment (uristat) due to risk of gastrointestinal bleeding with concurrent Plavix use. -Send urine for microscopy and culture. -Encourage increased fluid intake, including cranberry tablets, while monitoring for signs of fluid overload due to heart condition.  In the setting of Chronic systolic CHF Medication Management: Current medications should include Torsemide 20mg , . Noted potential discrepancy in medication list and patient's reported medications. -Update medication list to accurately reflect patient's current regimen. -Advise patient to carry an updated medication list at all times. Cardiac History: History of heart attack, aortic dissection, and high blood pressure. Patient reports fluid retention primarily in the abdominal area, not responsive to Lasix but improved with Torsemide. -Continue current cardiac medications and monitor for side effects. -Advise patient to monitor fluid intake and output, and adjust as necessary to maintain balance.  Follow-up: Pending results of urine tests. -Contact patient with results of urine tests when available. -Advise patient to return if symptoms worsen or do not improve.       No follow-ups on file.     The patient was advised to call back or seek an in-person evaluation if the symptoms worsen or if the condition fails to improve as anticipated.  I discussed the assessment and treatment plan with the patient. The patient was provided an opportunity to ask questions and all were answered. The patient agreed with the plan and demonstrated an understanding of the instructions.  I, Debera Lat, PA-C have reviewed all documentation for this visit. The documentation on  11/09/22 for  the exam, diagnosis, procedures, and orders are all accurate and complete.  Debera Lat, [facility], MMS [facility] [phone] (phone) [phone] (fax)  [facility]

## 2022-11-10 LAB — URINALYSIS, MICROSCOPIC ONLY
Bacteria, UA: NONE SEEN
Casts: NONE SEEN /lpf
RBC, Urine: NONE SEEN /hpf (ref 0–2)

## 2022-11-14 LAB — URINE CULTURE

## 2022-11-14 MED ORDER — CEPHALEXIN 500 MG PO CAPS
500.0000 mg | ORAL_CAPSULE | Freq: Two times a day (BID) | ORAL | 0 refills | Status: AC
Start: 2022-11-14 — End: ?

## 2022-11-14 NOTE — Progress Notes (Signed)
Spoke with pt, med was sent. Side effects discussed.

## 2022-11-14 NOTE — Addendum Note (Signed)
Addended by: Debera Lat on: 11/14/2022 05:13 PM   Modules accepted: Orders

## 2022-11-16 DIAGNOSIS — R202 Paresthesia of skin: Secondary | ICD-10-CM | POA: Diagnosis not present

## 2022-11-17 DIAGNOSIS — L309 Dermatitis, unspecified: Secondary | ICD-10-CM | POA: Diagnosis not present

## 2022-11-17 DIAGNOSIS — R202 Paresthesia of skin: Secondary | ICD-10-CM | POA: Diagnosis not present

## 2022-11-18 NOTE — Progress Notes (Unsigned)
PCP: Primary Cardiologist:  HPI:  Jessica Wheeler is a 82 y/o female with a history of CAD, DM, hyperlipidemia, HTN, RBBB, previous tobacco use and chronic heart failure.   Echo 07/09/22: EF 35-40% along with mild MR  RHC/LHC 07/08/22:  Significant multivessel coronary artery disease, as detailed below.  Culprit lesion for the patient's NSTEMI is a hazy 99% stenosis of the mid LAD with TIMI-1 flow.  In addition, there is moderate disease involving the distal LMCA, proximal/mid LAD, and RCA, as detailed below. Moderately elevated left heart filling pressures (LVEDP 30 mmHg, PCWP 26 mmHg). Mildly-moderately elevated right heart filling pressures (mean RA 11 mmHg, RVEDP 14 mmHg). Normal Fick cardiac output/index (CO 4.6 L/min, CI 2.8 L/min/m). Successful PCI to 99% mid LAD stenosis using Onyx frontier 2.0 x 12 mm drug-eluting stent with 0% residual stenosis and improvement in flow from TIMI-1 to TIMI-2.  Was in the ED 07/29/22 due to chest pain, fatigue and SOB. Admitted 07/19/22 due to GIB. Anticoag changed. Admitted 07/07/22 due to acute onset of midsternal chest pain. Troponin peaked at 6442. Cath and DES placed in LAD. IV lasix given.    She presents today for her initial visit with a chief complaint of moderate fatigue with minimal exertion. Has been present for several weeks. Has associated SOB (improving), dizziness and easy bruising along with this. Denies difficulty sleeping, abdominal distention, palpitations, pedal edema, chest pain, cough or weight gain.   Did note abdominal distention about a week ago and the [facility] provider started her on furosemide 20mg  / potassium daily and she notes improvement of this since that time. She did have to take an additional dose a couple of days ago due to weight gain. Since starting the diuretic, she also feels less SOB and feels like she can walk further.   Expresses great disappointment in the health care system beginning with her admission in  early March. She feels let down by the providers and that she wasn't listened to.      ROS: All systems negative except as listed in HPI, PMH and Problem List.  SH:  Social History   Socioeconomic History   Marital status: Widowed    Spouse name: Not on file   Number of children: 2   Years of education: 14   Highest education level: Associate degree: occupational, Scientist, product/process development, or vocational program  Occupational History   Occupation: Retired  Tobacco Use   Smoking status: Former    Current packs/day: 0.00    Average packs/day: 1 pack/day for 15.0 years (15.0 ttl pk-yrs)    Types: Cigarettes    Start date: 08/02/1988    Quit date: 08/03/2003    Years since quitting: 19.3   Smokeless tobacco: Never  Vaping Use   Vaping status: Never Used  Substance and Sexual Activity   Alcohol use: No   Drug use: No   Sexual activity: Not on file  Other Topics Concern   Not on file  Social History Narrative   Not on file   Social Determinants of Health   Financial Resource Strain: High Risk (10/30/2022)   Overall Financial Resource Strain (CARDIA)    Difficulty of Paying Living Expenses: Hard  Food Insecurity: No Food Insecurity (10/30/2022)   Hunger Vital Sign    Worried About Running Out of Food in the Last Year: Never true    Ran Out of Food in the Last Year: Never true  Transportation Needs: No Transportation Needs (10/30/2022)   PRAPARE - Transportation  Lack of Transportation (Medical): No    Lack of Transportation (Non-Medical): No  Physical Activity: Sufficiently Active (10/30/2022)   Exercise Vital Sign    Days of Exercise per Week: 4 days    Minutes of Exercise per Session: 40 min  Stress: Stress Concern Present (10/30/2022)   [employer] of Occupational Health - Occupational Stress Questionnaire    Feeling of Stress : To some extent  Social Connections: Socially Isolated (10/30/2022)   Social Connection and Isolation Panel [NHANES]    Frequency of Communication with  Friends and Family: Three times a week    Frequency of Social Gatherings with Friends and Family: More than three times a week    Attends Religious Services: Never    Database administrator or Organizations: No    Attends Banker Meetings: Not on file    Marital Status: Widowed  Intimate Partner Violence: Not At Risk (07/20/2022)   Humiliation, Afraid, Rape, and Kick questionnaire    Fear of Current or Ex-Partner: No    Emotionally Abused: No    Physically Abused: No    Sexually Abused: No    FH:  Family History  Problem Relation Age of Onset   Diabetes Mother    Hypertension Mother    Anxiety disorder Mother    Heart disease Father        rheumatic heart disease   Heart attack Father    Rheum arthritis Father    Colon cancer Brother    Hypertension Maternal Grandmother    Hypertension Maternal Grandfather    Breast cancer Maternal Aunt 60   Breast cancer Cousin 74    Past Medical History:  Diagnosis Date   Allergy    Arthritis    Bundle branch block, right    C7 cervical fracture (HCC)    CAD (coronary artery disease)    a. 06/2005 Cath: LM nl, LAD 45m, LCX nl, RCA 20ost->med rx; b. 07/2022 NSTEMI/PCI: LM 40d, LAD 50p, 40p/m, 71m (2.0x19 Onyx Frontier DES), RI 20, LCX small, mild/diff dzs, RCA 60ost, 65p, 60d. EF 25-35%.   CHF (congestive heart failure) (HCC)    Chronic HFrEF (heart failure with reduced ejection fraction) (HCC)    a. 10/2011 Echo: EF 65-70%; b. 07/2022 Echo: EF 35-40%, glob HK, basal wall motion best preserved. GrI DD, nl RV fxn, mild MR.   Diabetes mellitus    Diffuse cystic mastopathy    Dissection, aorta (HCC)    Dr.Dew follows   Diverticulosis    Family history of malignant neoplasm of gastrointestinal tract    Glaucoma 2005   Heart murmur    History of bronchitis    History of pancreatitis 2005   Hyperlipidemia LDL goal <70    Hypertension 1982   Ischemic cardiomyopathy    a. 07/2022 Echo: EF 35-40%.   Personal history of tobacco  use, presenting hazards to health    RBBB    Ulcer     Current Outpatient Medications  Medication Sig Dispense Refill   Accu-Chek FastClix Lancets MISC TEST FASTING BLOOD SUGAR EVERY DAY 102 each 3   aspirin 81 MG EC tablet Take 81 mg by mouth daily.     atorvastatin (LIPITOR) 80 MG tablet Take 1 tablet (80 mg total) by mouth at bedtime. (Patient taking differently: Take 40 mg by mouth at bedtime.) 90 tablet 1   Blood Glucose Monitoring Suppl (ACCU-CHEK GUIDE) w/Device KIT USE AS DIRECTED TO CHECK FASTING BLOOD SUGAR DAILY 1 kit 0  cephALEXin (KEFLEX) 500 MG capsule Take 1 capsule (500 mg total) by mouth 2 (two) times daily. 14 capsule 0   clopidogrel (PLAVIX) 75 MG tablet Take 1 tablet (75 mg total) by mouth daily. 90 tablet 1   cyclobenzaprine (FLEXERIL) 5 MG tablet      diltiazem (CARDIZEM CD) 120 MG 24 hr capsule Take 1 capsule (120 mg total) by mouth 2 (two) times daily. 180 capsule 1   glucose blood (ACCU-CHEK GUIDE) test strip TEST FASTING BLOOD SUGAR EVERY DAY AS DIRECTED 100 strip 2   Lancets Misc. (ACCU-CHEK FASTCLIX LANCET) KIT 1 each by Does not apply route as directed. 1 kit 1   latanoprost (XALATAN) 0.005 % ophthalmic solution Place 1 drop into both eyes at bedtime.   3   losartan (COZAAR) 25 MG tablet Take 1 tablet (25 mg total) by mouth daily. 90 tablet 3   Magnesium 250 MG TABS Take 250 mg by mouth 3 (three) times daily.     metFORMIN (GLUCOPHAGE) 500 MG tablet Take 1 tablet (500 mg total) by mouth 2 (two) times daily with a meal. 180 tablet 3   nitroGLYCERIN (NITROSTAT) 0.4 MG SL tablet Place 1 tablet (0.4 mg total) under the tongue every 5 (five) minutes as needed for chest pain. 100 tablet 3   potassium chloride SA (KLOR-CON M) 20 MEQ tablet Take 20 mEq by mouth daily.     potassium chloride SA (KLOR-CON M20) 20 MEQ tablet Take 1 tablet (20 mEq total) by mouth daily. 90 tablet 3   vitamin B-12 (CYANOCOBALAMIN) 1000 MCG tablet Take 1,000 mcg by mouth daily.     No  current facility-administered medications for this visit.   Facility-Administered Medications Ordered in Other Visits  Medication Dose Route Frequency Provider Last Rate Last Admin   ticagrelor (BRILINTA) tablet    PRN End, Cristal Deer, MD   180 mg at 07/08/22 1605      PHYSICAL EXAM:  General:  Well appearing. No resp difficulty HEENT: normal Neck: supple. JVP flat. Carotids 2+ bilaterally; no bruits. No lymphadenopathy or thryomegaly appreciated. Cor: PMI normal. Regular rate & rhythm. No rubs, gallops or murmurs. Lungs: clear Abdomen: soft, nontender, nondistended. No hepatosplenomegaly. No bruits or masses. Good bowel sounds. Extremities: no cyanosis, clubbing, rash, edema Neuro: alert & orientedx3, cranial nerves grossly intact. Moves all 4 extremities w/o difficulty. Affect pleasant.   ECG:   ASSESSMENT & PLAN:  1: Ischemic heart failure with reduced ejection fraction- - NYHA class III - euvolemic today - weighing daily; understands what weight gain to call for - Echo 07/09/22: EF 35-40% along with mild MR - has repeat echo scheduled for 06/24 - continue furosemide 20mg  daily/ potassium daily - continue losartan 25mg  daily - difficulty breathing/ chest pressure with beta-blockers - muscle cramps w/ jardiance - participating in [facility] HF program - not adding salt and is reading food labels for sodium content and understands to try and not have more than 2000mg / day - BNP 07/19/22 was 730.6  2: HTN- - BP 138/54 - saw PCP Jessica Wheeler) 04/24 - BMP 08/09/22 showed sodium 134, potassium 3.9, creatinine 0.83 & GFR 70  3: CAD- - saw cardiology Jessica Wheeler) 04/24 - currently participating in cardiac rehab - RHC/LHC 07/08/22: Culprit lesion for the patient's NSTEMI is a hazy 99% stenosis of the mid LAD with TIMI-1 flow.  In addition, there is moderate disease involving the distal LMCA, proximal/mid LAD, and RCA. Moderately elevated left heart filling pressures (LVEDP  30 mmHg, PCWP  26 mmHg). Mildly-moderately elevated right heart filling pressures (mean RA 11 mmHg, RVEDP 14 mmHg). Normal Fick cardiac output/index (CO 4.6 L/min, CI 2.8 L/min/m). Successful PCI to 99% mid LAD stenosis using Onyx frontier 2.0 x 12 mm drug-eluting stent with 0% residual stenosis and improvement in flow from TIMI-1 to TIMI-2. - continue ASA 81mg  daily - continue clopidogrel 75mg  daily  4: DM- - A1c 07/07/22 was 7.1% - continue metformin 1000mg  BID   Emotional support given regarding her unpleasant experiences regarding her medical care where she feels like she slipped through the cracks numerous times. Apologized for her feeling let down with her providers and the hospital. Contact information given for Patient Relations/ Grievance Dept so that she can express her feelings to them. She was appreciative of this information.   Return here in 6 weeks, sooner if needed.

## 2022-11-21 ENCOUNTER — Ambulatory Visit: Payer: Medicare HMO | Attending: Family | Admitting: Family

## 2022-11-21 ENCOUNTER — Encounter: Payer: Self-pay | Admitting: Family

## 2022-11-21 VITALS — BP 122/57 | HR 70 | Wt 127.2 lb

## 2022-11-21 DIAGNOSIS — R252 Cramp and spasm: Secondary | ICD-10-CM | POA: Insufficient documentation

## 2022-11-21 DIAGNOSIS — E1136 Type 2 diabetes mellitus with diabetic cataract: Secondary | ICD-10-CM | POA: Diagnosis not present

## 2022-11-21 DIAGNOSIS — E785 Hyperlipidemia, unspecified: Secondary | ICD-10-CM | POA: Diagnosis not present

## 2022-11-21 DIAGNOSIS — Z7984 Long term (current) use of oral hypoglycemic drugs: Secondary | ICD-10-CM | POA: Diagnosis not present

## 2022-11-21 DIAGNOSIS — R0789 Other chest pain: Secondary | ICD-10-CM | POA: Diagnosis not present

## 2022-11-21 DIAGNOSIS — Z7982 Long term (current) use of aspirin: Secondary | ICD-10-CM | POA: Insufficient documentation

## 2022-11-21 DIAGNOSIS — I5022 Chronic systolic (congestive) heart failure: Secondary | ICD-10-CM | POA: Insufficient documentation

## 2022-11-21 DIAGNOSIS — R531 Weakness: Secondary | ICD-10-CM | POA: Diagnosis not present

## 2022-11-21 DIAGNOSIS — Z833 Family history of diabetes mellitus: Secondary | ICD-10-CM | POA: Insufficient documentation

## 2022-11-21 DIAGNOSIS — I10 Essential (primary) hypertension: Secondary | ICD-10-CM | POA: Diagnosis not present

## 2022-11-21 DIAGNOSIS — Z7902 Long term (current) use of antithrombotics/antiplatelets: Secondary | ICD-10-CM | POA: Insufficient documentation

## 2022-11-21 DIAGNOSIS — Z79899 Other long term (current) drug therapy: Secondary | ICD-10-CM | POA: Diagnosis not present

## 2022-11-21 DIAGNOSIS — I110 Hypertensive heart disease with heart failure: Secondary | ICD-10-CM | POA: Diagnosis not present

## 2022-11-21 DIAGNOSIS — I252 Old myocardial infarction: Secondary | ICD-10-CM | POA: Diagnosis not present

## 2022-11-21 DIAGNOSIS — I2510 Atherosclerotic heart disease of native coronary artery without angina pectoris: Secondary | ICD-10-CM | POA: Diagnosis not present

## 2022-11-21 DIAGNOSIS — Z87891 Personal history of nicotine dependence: Secondary | ICD-10-CM | POA: Insufficient documentation

## 2022-11-21 DIAGNOSIS — I5032 Chronic diastolic (congestive) heart failure: Secondary | ICD-10-CM

## 2022-11-21 DIAGNOSIS — E1159 Type 2 diabetes mellitus with other circulatory complications: Secondary | ICD-10-CM

## 2022-11-21 DIAGNOSIS — Z955 Presence of coronary angioplasty implant and graft: Secondary | ICD-10-CM | POA: Insufficient documentation

## 2022-11-21 DIAGNOSIS — I25118 Atherosclerotic heart disease of native coronary artery with other forms of angina pectoris: Secondary | ICD-10-CM

## 2022-11-21 NOTE — Patient Instructions (Signed)
Call us in the future if you need us for anything 

## 2022-11-22 ENCOUNTER — Encounter: Payer: Self-pay | Admitting: Family

## 2022-11-25 ENCOUNTER — Ambulatory Visit: Payer: Self-pay | Admitting: *Deleted

## 2022-11-25 DIAGNOSIS — R300 Dysuria: Secondary | ICD-10-CM

## 2022-11-25 MED ORDER — NITROFURANTOIN MONOHYD MACRO 100 MG PO CAPS
100.0000 mg | ORAL_CAPSULE | Freq: Two times a day (BID) | ORAL | 0 refills | Status: AC
Start: 2022-11-25 — End: 2022-11-30

## 2022-11-25 NOTE — Telephone Encounter (Signed)
  Chief Complaint: continued pain urinating after completing course of antibiotics Symptoms: urinary frequency, pressure and burning. Able to urinate and feels like "got to go again ". Completed keflex course and took x 7 days. Urine cx shows E. Coli Frequency: 11/09/22 Pertinent Negatives: Patient denies fever, no drops of urine no blood in urine  Disposition: [] ED /[] Urgent Care (no appt availability in office) / [] Appointment(In office/virtual)/ []  [place] [facility]/ [] Home Care/ [] Refused Recommended Disposition /[] [facility]/ [x]  Follow-up with PCP Additional Notes:   Please advise if another antibiotic needed. Patient reports she usually get cipro. Has been drinking more than her recommended intake. Recommended patient monitor intake due to heart issues. Please advise       Reason for Disposition  Diabetes mellitus or weak immune system (e.g., HIV positive, cancer chemo, splenectomy, organ transplant, chronic steroids)  Answer Assessment - Initial Assessment Questions 1. SEVERITY: "How bad is the pain?"  (e.g., Scale 1-10; mild, moderate, or severe)   - MILD (1-3): complains slightly about urination hurting   - MODERATE (4-7): interferes with normal activities     - SEVERE (8-10): excruciating, unwilling or unable to urinate because of the pain      Pain and pressure with urination  2. FREQUENCY: "How many times have you had painful urination today?"      Does have frequency  3. PATTERN: "Is pain present every time you urinate or just sometimes?"      na 4. ONSET: "When did the painful urination start?"      On going since 11/09/22 5. FEVER: "Do you have a fever?" If Yes, ask: "What is your temperature, how was it measured, and when did it start?"     na 6. PAST UTI: "Have you had a urine infection before?" If Yes, ask: "When was the last time?" and "What happened that time?"      Yes recent treatment 11/09/22 OV took 7 days of keflex 7. CAUSE: "What do you think  is causing the painful urination?"  (e.g., UTI, scratch, Herpes sore)     UTI  8. OTHER SYMPTOMS: "Do you have any other symptoms?" (e.g., blood in urine, flank pain, genital sores, urgency, vaginal discharge)     Burning , frequency and pain pressure with urination 9. PREGNANCY: "Is there any chance you are pregnant?" "When was your last menstrual period?"     na  Protocols used: Urination Pain - Female-A-AH

## 2022-11-25 NOTE — Telephone Encounter (Signed)
Correction: Macrobid 100mg  BID x5d #10 r0

## 2022-11-25 NOTE — Telephone Encounter (Signed)
Summary: Continued UTI sx   /Pt is calling to report that she was seen in office on 07/10//24 calling to report she has completed the medication - still having sx of buring with urination, frequency using the restroom. Please advise        Called patient [phone] to review continued sx of UTI. No answer, LVMTCB [phone].

## 2022-11-25 NOTE — Telephone Encounter (Signed)
Patient advised that macrobid 100 mg BID was sent to pharmacy.

## 2022-11-25 NOTE — Telephone Encounter (Signed)
Treat with macrobid 500mg  BID x5 days (please send Rx #10 r0). If symptoms persist, get re-evaluated on Monday.

## 2022-11-28 DIAGNOSIS — R42 Dizziness and giddiness: Secondary | ICD-10-CM | POA: Diagnosis not present

## 2022-11-28 DIAGNOSIS — I10 Essential (primary) hypertension: Secondary | ICD-10-CM | POA: Diagnosis not present

## 2022-11-28 DIAGNOSIS — R0789 Other chest pain: Secondary | ICD-10-CM | POA: Diagnosis not present

## 2022-11-28 DIAGNOSIS — I2510 Atherosclerotic heart disease of native coronary artery without angina pectoris: Secondary | ICD-10-CM | POA: Diagnosis not present

## 2022-11-28 DIAGNOSIS — R000 Tachycardia, unspecified: Secondary | ICD-10-CM | POA: Diagnosis not present

## 2022-11-28 DIAGNOSIS — E119 Type 2 diabetes mellitus without complications: Secondary | ICD-10-CM | POA: Diagnosis not present

## 2022-11-28 DIAGNOSIS — R079 Chest pain, unspecified: Secondary | ICD-10-CM | POA: Diagnosis not present

## 2022-11-28 DIAGNOSIS — Z955 Presence of coronary angioplasty implant and graft: Secondary | ICD-10-CM | POA: Diagnosis not present

## 2022-11-28 DIAGNOSIS — I252 Old myocardial infarction: Secondary | ICD-10-CM | POA: Diagnosis not present

## 2022-11-30 ENCOUNTER — Encounter: Payer: Self-pay | Admitting: Cardiovascular Disease

## 2022-12-12 ENCOUNTER — Other Ambulatory Visit: Payer: Self-pay | Admitting: Family Medicine

## 2022-12-12 MED ORDER — CLOPIDOGREL BISULFATE 75 MG PO TABS: 75.0000 mg | ORAL_TABLET | Freq: Every day | ORAL | 1 refills | Status: DC

## 2022-12-12 MED ORDER — TORSEMIDE 20 MG PO TABS: 20.0000 mg | ORAL_TABLET | Freq: Every day | ORAL | 1 refills | Status: DC

## 2022-12-12 NOTE — Telephone Encounter (Signed)
[facility] faxed refill request for the following medications:  Pravastatin 10mg  tab  torsemide (DEMADEX) 20 MG tablet    clopidogrel (PLAVIX) 75 MG tablet   Please advise.

## 2022-12-16 DIAGNOSIS — M542 Cervicalgia: Secondary | ICD-10-CM | POA: Diagnosis not present

## 2022-12-21 ENCOUNTER — Encounter (HOSPITAL_COMMUNITY): Payer: Self-pay

## 2022-12-21 ENCOUNTER — Other Ambulatory Visit (HOSPITAL_COMMUNITY): Payer: Self-pay | Admitting: *Deleted

## 2022-12-21 DIAGNOSIS — Z01810 Encounter for preprocedural cardiovascular examination: Secondary | ICD-10-CM

## 2022-12-27 ENCOUNTER — Telehealth (HOSPITAL_COMMUNITY): Payer: Self-pay | Admitting: *Deleted

## 2022-12-27 NOTE — Telephone Encounter (Signed)
Patient returning call upcoming cardiac imaging study; pt verbalizes understanding of appt date/time, parking situation and where to check in, and verified current allergies; name and call back number provided for further questions should they arise  Larey Brick RN Navigator Cardiac Imaging [facility] [phone] office [phone] cell  Patient will arrive at 10:30 AM for her EKG. She is aware to avoid caffeine 12 hours prior. She denies claustrophobia but reports dental implants and a cervical cage. MRI states that the cervical cage is fine in MRI.

## 2022-12-27 NOTE — Telephone Encounter (Signed)
Attempted to call patient regarding upcoming cardiac MRI appointment. Left message on voicemail with name and callback number  Merle Prescott RN Navigator Cardiac Imaging [facility] [phone] Office [phone] Cell  

## 2022-12-29 ENCOUNTER — Ambulatory Visit (HOSPITAL_COMMUNITY)
Admission: RE | Admit: 2022-12-29 | Discharge: 2022-12-29 | Disposition: A | Discharge status: Home / Self Care | Payer: Medicare HMO | Source: Ambulatory Visit | Attending: Cardiology | Admitting: Cardiology

## 2022-12-29 ENCOUNTER — Other Ambulatory Visit (HOSPITAL_COMMUNITY): Payer: Medicare HMO

## 2022-12-29 ENCOUNTER — Ambulatory Visit (HOSPITAL_COMMUNITY)
Admission: RE | Admit: 2022-12-29 | Discharge: 2022-12-29 | Disposition: A | Discharge status: Home / Self Care | Payer: Medicare HMO | Source: Ambulatory Visit | Attending: Internal Medicine | Admitting: Internal Medicine

## 2022-12-29 ENCOUNTER — Encounter: Payer: Self-pay | Admitting: Cardiology

## 2022-12-29 ENCOUNTER — Other Ambulatory Visit: Payer: Self-pay | Admitting: Internal Medicine

## 2022-12-29 DIAGNOSIS — Z01810 Encounter for preprocedural cardiovascular examination: Secondary | ICD-10-CM | POA: Diagnosis not present

## 2022-12-29 DIAGNOSIS — I998 Other disorder of circulatory system: Secondary | ICD-10-CM | POA: Diagnosis not present

## 2022-12-29 DIAGNOSIS — Z01818 Encounter for other preprocedural examination: Secondary | ICD-10-CM | POA: Insufficient documentation

## 2022-12-29 MED ORDER — REGADENOSON 0.4 MG/5ML IV SOLN
0.4000 mg | Freq: Once | INTRAVENOUS | Status: AC
  Administered 2022-12-29: 0.4 mg via INTRAVENOUS
  Filled 2022-12-29: qty 5

## 2022-12-29 MED ORDER — REGADENOSON 0.4 MG/5ML IV SOLN
INTRAVENOUS | Status: AC
  Filled 2022-12-29: qty 5

## 2022-12-29 MED ORDER — GADOBUTROL 1 MMOL/ML IV SOLN: 10.0000 mL | Freq: Once | INTRAVENOUS | Status: DC | PRN

## 2022-12-29 NOTE — Progress Notes (Signed)
Patient presents for stress MRI.  BP 153/48, HR 76.  No caffeine intake in prior 12 hours. No wheezing on exam.  EKG today shows NSR, rate 76, nonspecific intraventicular conduction delay.   Shared Decision Making/Informed Consent The risks [chest pain, shortness of breath, cardiac arrhythmias, dizziness, blood pressure fluctuations, myocardial infarction, stroke/transient ischemic attack, nausea, vomiting, allergic reaction, and life-threatening complications (estimated to be 1 in 10,000)], benefits (risk stratification, diagnosing coronary artery disease, treatment guidance) and alternatives of a MRI stress test were discussed in detail with patient and they agree to proceed.

## 2023-01-06 ENCOUNTER — Other Ambulatory Visit: Payer: Self-pay | Admitting: Family Medicine

## 2023-01-06 DIAGNOSIS — E1169 Type 2 diabetes mellitus with other specified complication: Secondary | ICD-10-CM

## 2023-01-31 ENCOUNTER — Encounter: Payer: Self-pay | Admitting: Family Medicine

## 2023-01-31 ENCOUNTER — Ambulatory Visit: Payer: Medicare HMO | Admitting: Family Medicine

## 2023-01-31 VITALS — BP 131/46 | HR 67 | Ht 63.0 in | Wt 126.9 lb

## 2023-01-31 DIAGNOSIS — E785 Hyperlipidemia, unspecified: Secondary | ICD-10-CM

## 2023-01-31 DIAGNOSIS — M791 Myalgia, unspecified site: Secondary | ICD-10-CM

## 2023-01-31 DIAGNOSIS — D649 Anemia, unspecified: Secondary | ICD-10-CM | POA: Diagnosis not present

## 2023-01-31 DIAGNOSIS — I152 Hypertension secondary to endocrine disorders: Secondary | ICD-10-CM

## 2023-01-31 DIAGNOSIS — E1159 Type 2 diabetes mellitus with other circulatory complications: Secondary | ICD-10-CM | POA: Diagnosis not present

## 2023-01-31 DIAGNOSIS — D692 Other nonthrombocytopenic purpura: Secondary | ICD-10-CM | POA: Insufficient documentation

## 2023-01-31 DIAGNOSIS — Z23 Encounter for immunization: Secondary | ICD-10-CM | POA: Diagnosis not present

## 2023-01-31 DIAGNOSIS — E1169 Type 2 diabetes mellitus with other specified complication: Secondary | ICD-10-CM

## 2023-01-31 DIAGNOSIS — T466X5D Adverse effect of antihyperlipidemic and antiarteriosclerotic drugs, subsequent encounter: Secondary | ICD-10-CM

## 2023-01-31 DIAGNOSIS — I5022 Chronic systolic (congestive) heart failure: Secondary | ICD-10-CM | POA: Diagnosis not present

## 2023-01-31 MED ORDER — POTASSIUM CHLORIDE CRYS ER 20 MEQ PO TBCR: 30.0000 meq | EXTENDED_RELEASE_TABLET | Freq: Every day | ORAL | 3 refills | Status: DC

## 2023-01-31 MED ORDER — TORSEMIDE 20 MG PO TABS: 30.0000 mg | ORAL_TABLET | Freq: Every day | ORAL | 1 refills | Status: DC

## 2023-01-31 NOTE — Assessment & Plan Note (Signed)
Myalgias resolved with switch to pravastatin and adding coq10 Continue current meds Rechek at next visit

## 2023-01-31 NOTE — Assessment & Plan Note (Signed)
Fasting blood sugars appear well controlled. -Check HbA1c today. - no changes to meds pending A1c

## 2023-01-31 NOTE — Assessment & Plan Note (Signed)
Chronic and stable Likely due to DAPT

## 2023-01-31 NOTE — Assessment & Plan Note (Signed)
Well controlled on current regimen.  -Continue current antihypertensive regimen. - monitor for any hypotension with increased torsemide dose

## 2023-01-31 NOTE — Assessment & Plan Note (Signed)
Fluid retention despite Torsemide 20mg  daily. Patient reports improved exercise tolerance with increased diuresis. -Increase Torsemide to 30mg  daily and Kdur to 30 mEq daily -Check electrolytes, kidney and liver function in 1 week.

## 2023-01-31 NOTE — Assessment & Plan Note (Signed)
Patient reports bruising and suspects anemia. -Order complete blood count and iron panel today.

## 2023-01-31 NOTE — Progress Notes (Signed)
Established Patient Office Visit  Subjective   Patient ID: Jessica Wheeler, female    DOB: 05-07-1940  Age: 82 y.o. MRN: [id number]  Chief Complaint  Patient presents with   Medical Management of Chronic Issues    3 month follow up, confidential questions regarding meds     HPI  Discussed the use of AI scribe software for clinical note transcription with the patient, who gave verbal consent to proceed.  History of Present Illness   The patient, with a history of heart disease and diabetes, presents with concerns about fluid retention and medication management. She reports that her blood sugar levels have been relatively stable, with fasting readings in the morning. However, she expresses concern about her fingernails, which have become brittle and ridged, and she wonders if this could be a sign of an underlying health issue.  The patient also discusses her heart health, mentioning a heart attack she experienced several months ago. She reports that her fluid medication, torsemide, does not seem to be working effectively, as she has noticed fluctuations in her weight and swelling in her legs. She is also on potassium, pravastatin, and diltiazem, but expresses dissatisfaction with the dosing schedule of the diltiazem, feeling that she felt better when taking it three times a day rather than twice.  In addition to these concerns, the patient mentions potential anemia, noting frequent bruising and suspecting that she is losing blood. She also expresses concern about carpal tunnel syndrome and asks about potential hand surgeons in the area. Finally, she mentions that she has not yet received her second shingles shot due to various health issues that have arisen.         ROS per HPI    Objective:     BP (!) 131/46 (BP Location: Left Arm, Patient Position: Sitting, Cuff Size: Normal)   Pulse 67   Ht 5\' 3"  (1.6 m)   Wt 126 lb 14.4 oz (57.6 kg)   SpO2 98%   BMI 22.48 kg/m    Physical  Exam Vitals reviewed.  Constitutional:      General: She is not in acute distress.    Appearance: Normal appearance. She is well-developed. She is not diaphoretic.  HENT:     Head: Normocephalic and atraumatic.  Eyes:     General: No scleral icterus.    Conjunctiva/sclera: Conjunctivae normal.  Neck:     Thyroid: No thyromegaly.  Cardiovascular:     Rate and Rhythm: Normal rate and regular rhythm.     Heart sounds: Murmur heard.  Pulmonary:     Effort: Pulmonary effort is normal. No respiratory distress.     Breath sounds: Normal breath sounds. No wheezing, rhonchi or rales.  Musculoskeletal:     Cervical back: Neck supple.     Right lower leg: No edema.     Left lower leg: No edema.  Lymphadenopathy:     Cervical: No cervical adenopathy.  Skin:    General: Skin is warm and dry.     Findings: No rash.  Neurological:     Mental Status: She is alert and oriented to person, place, and time. Mental status is at baseline.  Psychiatric:        Mood and Affect: Mood normal.        Behavior: Behavior normal.      No results found for any visits on 01/31/23.    The ASCVD Risk score (Arnett DK, et al., 2019) failed to calculate for the following reasons:  The 2019 ASCVD risk score is only valid for ages 26 to 79   The patient has a prior MI or stroke diagnosis    Assessment & Plan:   Problem List Items Addressed This Visit       Cardiovascular and Mediastinum   Hypertension associated with diabetes (HCC)    Well controlled on current regimen.  -Continue current antihypertensive regimen. - monitor for any hypotension with increased torsemide dose      Relevant Medications   diltiazem (CARDIZEM CD) 180 MG 24 hr capsule   pravastatin (PRAVACHOL) 10 MG tablet   torsemide (DEMADEX) 20 MG tablet   Other Relevant Orders   Comprehensive metabolic panel   Chronic systolic CHF (congestive heart failure) (HCC)    Fluid retention despite Torsemide 20mg  daily. Patient  reports improved exercise tolerance with increased diuresis. -Increase Torsemide to 30mg  daily and Kdur to 30 mEq daily -Check electrolytes, kidney and liver function in 1 week.      Relevant Medications   diltiazem (CARDIZEM CD) 180 MG 24 hr capsule   pravastatin (PRAVACHOL) 10 MG tablet   torsemide (DEMADEX) 20 MG tablet   Senile purpura (HCC)    Chronic and stable Likely due to DAPT      Relevant Medications   diltiazem (CARDIZEM CD) 180 MG 24 hr capsule   pravastatin (PRAVACHOL) 10 MG tablet   torsemide (DEMADEX) 20 MG tablet     Endocrine   DM (diabetes mellitus) (HCC) - Primary    Fasting blood sugars appear well controlled. -Check HbA1c today. - no changes to meds pending A1c      Relevant Medications   pravastatin (PRAVACHOL) 10 MG tablet   Other Relevant Orders   Flu Vaccine Trivalent High Dose (Fluad) (Completed)   Hemoglobin A1c   Hyperlipidemia associated with type 2 diabetes mellitus (HCC)    Myalgias resolved with switch to pravastatin and adding coq10 Continue current meds Rechek at next visit      Relevant Medications   diltiazem (CARDIZEM CD) 180 MG 24 hr capsule   pravastatin (PRAVACHOL) 10 MG tablet   torsemide (DEMADEX) 20 MG tablet   Other Relevant Orders   Comprehensive metabolic panel     Other   Myalgia due to statin   Anemia    Patient reports bruising and suspects anemia. -Order complete blood count and iron panel today.      Relevant Orders   CBC w/Diff/Platelet   Iron, TIBC and Ferritin Panel        Nail Changes Patient reports brittle nails with ridges, likely related to prior heart attack. -No intervention needed at this time.  Diltiazem Dosing Patient reports feeling better on 120mg  TID vs 180mg  BID. -Discuss with cardiologist at upcoming appointment.  Carpal Tunnel Syndrome Patient inquiring about hand surgeon for carpal tunnel syndrome. -Refer to Dr. Amanda Pea or another hand surgeon pending patient's decision.  Colon  Cancer Screening Patient inquiring about Cologuard test. -Provide Cologuard contact number for patient to check insurance coverage. - she has aged out of screening but does have a family history - she is asymptomatic  Shingles Vaccination Patient has not received second dose of shingles vaccine. -Remind patient there is no "too late" for second dose.  Follow-up -Check blood pressure at home and report readings via MyChart in 1 week.        Return in about 6 months (around 08/01/2023) for AWV, CPE (after 08/09/23).    Shirlee Latch, MD

## 2023-02-01 LAB — CBC WITH DIFFERENTIAL/PLATELET
Basophils Absolute: 0.1 10*3/uL (ref 0.0–0.2)
Basos: 1 %
EOS (ABSOLUTE): 0.2 10*3/uL (ref 0.0–0.4)
Eos: 3 %
Hematocrit: 35.8 % (ref 34.0–46.6)
Hemoglobin: 11 g/dL — ABNORMAL LOW (ref 11.1–15.9)
Immature Grans (Abs): 0 10*3/uL (ref 0.0–0.1)
Immature Granulocytes: 0 %
Lymphocytes Absolute: 1.5 10*3/uL (ref 0.7–3.1)
Lymphs: 26 %
MCH: 27.9 pg (ref 26.6–33.0)
MCHC: 30.7 g/dL — ABNORMAL LOW (ref 31.5–35.7)
MCV: 91 fL (ref 79–97)
Monocytes Absolute: 0.5 10*3/uL (ref 0.1–0.9)
Monocytes: 9 %
Neutrophils Absolute: 3.4 10*3/uL (ref 1.4–7.0)
Neutrophils: 61 %
Platelets: 294 10*3/uL (ref 150–450)
RBC: 3.94 x10E6/uL (ref 3.77–5.28)
RDW: 13.9 % (ref 11.7–15.4)
WBC: 5.5 10*3/uL (ref 3.4–10.8)

## 2023-02-01 LAB — IRON,TIBC AND FERRITIN PANEL
Ferritin: 13 ng/mL — ABNORMAL LOW (ref 15–150)
Iron Saturation: 10 % — ABNORMAL LOW (ref 15–55)
Iron: 35 ug/dL (ref 27–139)
Total Iron Binding Capacity: 356 ug/dL (ref 250–450)
UIBC: 321 ug/dL (ref 118–369)

## 2023-02-01 LAB — COMPREHENSIVE METABOLIC PANEL
ALT: 23 [IU]/L (ref 0–32)
AST: 28 [IU]/L (ref 0–40)
Albumin: 4.3 g/dL (ref 3.7–4.7)
Alkaline Phosphatase: 81 [IU]/L (ref 44–121)
BUN/Creatinine Ratio: 25 (ref 12–28)
BUN: 22 mg/dL (ref 8–27)
Bilirubin Total: 0.3 mg/dL (ref 0.0–1.2)
CO2: 23 mmol/L (ref 20–29)
Calcium: 9.4 mg/dL (ref 8.7–10.3)
Chloride: 101 mmol/L (ref 96–106)
Creatinine, Ser: 0.89 mg/dL (ref 0.57–1.00)
Globulin, Total: 2.8 g/dL (ref 1.5–4.5)
Glucose: 153 mg/dL — ABNORMAL HIGH (ref 70–99)
Potassium: 4.2 mmol/L (ref 3.5–5.2)
Sodium: 139 mmol/L (ref 134–144)
Total Protein: 7.1 g/dL (ref 6.0–8.5)
eGFR: 64 mL/min/{1.73_m2} (ref >59)

## 2023-02-01 LAB — HEMOGLOBIN A1C
Est. average glucose Bld gHb Est-mCnc: 157 mg/dL
Hgb A1c MFr Bld: 7.1 % — ABNORMAL HIGH (ref 4.8–5.6)

## 2023-02-21 DIAGNOSIS — E119 Type 2 diabetes mellitus without complications: Secondary | ICD-10-CM | POA: Diagnosis not present

## 2023-02-21 DIAGNOSIS — H43813 Vitreous degeneration, bilateral: Secondary | ICD-10-CM | POA: Diagnosis not present

## 2023-02-21 DIAGNOSIS — Z7984 Long term (current) use of oral hypoglycemic drugs: Secondary | ICD-10-CM | POA: Diagnosis not present

## 2023-02-21 DIAGNOSIS — Z961 Presence of intraocular lens: Secondary | ICD-10-CM | POA: Diagnosis not present

## 2023-02-21 DIAGNOSIS — H353132 Nonexudative age-related macular degeneration, bilateral, intermediate dry stage: Secondary | ICD-10-CM | POA: Diagnosis not present

## 2023-02-21 DIAGNOSIS — H401131 Primary open-angle glaucoma, bilateral, mild stage: Secondary | ICD-10-CM | POA: Diagnosis not present

## 2023-02-22 LAB — HM DIABETES EYE EXAM

## 2023-02-23 ENCOUNTER — Ambulatory Visit: Payer: Self-pay | Admitting: *Deleted

## 2023-02-23 NOTE — Telephone Encounter (Signed)
  Chief Complaint: Heavier than usual rectal bleeding.  Has a prolapsed rectum.   Usually sees a little blood on the toilet paper but this morning after walking a mile and a half in the park she had enough blood in her underwear it looked like she had started a period. Symptoms: More blood than usual this morning Frequency: Just this morning Pertinent Negatives: Patient denies Any other symptoms other than just a feeling good yesterday and today.   Tired feeling. Disposition: [] ED /[] Urgent Care (no appt availability in office) / [x] Appointment(In office/virtual)/ []  [facility]/ [] Home Care/ [] Refused Recommended Disposition /[] [facility]/ []  Follow-up with PCP Additional Notes: Appt made with Debera Lat, PA-C for 02/24/2023 at 11:00.   Pt. Has seen her before.    She Prefers to see Dr. Beryle Flock if possible.   She is familiar with her situation with the prolapsed uterus.    Can she be worked in?

## 2023-02-23 NOTE — Telephone Encounter (Signed)
Reason for Disposition  Taking Coumadin (warfarin) or other strong blood thinner, or known bleeding disorder (e.g., thrombocytopenia)  Answer Assessment - Initial Assessment Questions 1. AMOUNT: "Describe the bleeding that you are having." "How much bleeding is there?"    - SPOTTING: spotting, or pinkish / brownish mucous discharge; does not fill panty liner or pad    - MILD:  less than 1 pad / hour; less than patient's usual menstrual bleeding   - MODERATE: 1-2 pads / hour; 1 menstrual cup every 6 hours; small-medium blood clots (e.g., pea, grape, small coin)   - SEVERE: soaking 2 or more pads/hour for 2 or more hours; 1 menstrual cup every 2 hours; bleeding not contained by pads or continuous red blood from vagina; large blood clots (e.g., golf ball, large coin)      When I wiped this morning I saw blood on toilet paper   I have a prolapsed rectum.     This is not unusual to see blood. It look like I've started a period after walking in the park. 2. ONSET: "When did the bleeding begin?" "Is it continuing now?"     This morning after walking in the park this morning it looks like I've started a period.    I'm on [place] after having a heart attack.   I don't know what I should do.      3. MENOPAUSE: "When was your last menstrual period?"      82 yrs old   not asked 4. ABDOMEN PAIN: "Do you have any pain?" "How bad is the pain?"  (e.g., Scale 1-10; mild, moderate, or severe)   - MILD (1-3): doesn't interfere with normal activities, abdomen soft and not tender to touch    - MODERATE (4-7): interferes with normal activities or awakens from sleep, abdomen tender to touch    - SEVERE (8-10): excruciating pain, doubled over, unable to do any normal activities      Yesterday I didn't feel good.   I've had a very busy week.    5. BLOOD THINNERS: "Do you take any blood thinners?" (e.g., Coumadin/warfarin, Pradaxa/dabigatran, aspirin)     I'm on Brelinta after having a heart attack.   I walked a mile  and a half this morning.   I usually walk 2 miles. 6. HORMONE MEDICINES: "Are you taking any hormone medicines, prescription or OTC?" (e.g., birth control pills, estrogen)     Not asked 7. CAUSE: "What do you think is causing the bleeding?" (e.g., recent gyn surgery, recent gyn procedure; known bleeding disorder, uterine cancer)       I have a prolapsed rectum. 8. HEMODYNAMIC STATUS: "Are you weak or feeling lightheaded?" If Yes, ask: "Can you stand and walk normally?"       Don't feel good 9. OTHER SYMPTOMS: "What other symptoms are you having with the bleeding?" (e.g., back pain, burning with urination, fever)     Nothing just not feeling good.  Protocols used: Vaginal Bleeding - Postmenopausal-A-AH

## 2023-02-24 ENCOUNTER — Encounter: Payer: Self-pay | Admitting: Physician Assistant

## 2023-02-24 ENCOUNTER — Ambulatory Visit (INDEPENDENT_AMBULATORY_CARE_PROVIDER_SITE_OTHER): Payer: Medicare HMO | Admitting: Physician Assistant

## 2023-02-24 VITALS — BP 128/69 | HR 71 | Wt 128.8 lb

## 2023-02-24 DIAGNOSIS — R3989 Other symptoms and signs involving the genitourinary system: Secondary | ICD-10-CM

## 2023-02-24 LAB — POCT URINALYSIS DIPSTICK
Bilirubin, UA: NEGATIVE
Blood, UA: NEGATIVE
Glucose, UA: NEGATIVE
Ketones, UA: NEGATIVE
Nitrite, UA: NEGATIVE
Protein, UA: NEGATIVE
Spec Grav, UA: 1.015 (ref 1.010–1.025)
Urobilinogen, UA: 0.2 U/dL
pH, UA: 6 (ref 5.0–8.0)

## 2023-02-24 MED ORDER — CEPHALEXIN 500 MG PO CAPS
500.0000 mg | ORAL_CAPSULE | Freq: Two times a day (BID) | ORAL | 0 refills | Status: DC
Start: 2023-02-24 — End: 2023-08-14

## 2023-02-24 NOTE — Progress Notes (Unsigned)
Established patient visit  Patient: Jessica Wheeler   DOB: 1940-12-04   82 y.o. Female  MRN: [id number] Visit Date: 02/24/2023  Today's healthcare provider: Debera Lat, PA-C   No chief complaint on file.  Subjective    HPI  *** Discussed the use of AI scribe software for clinical note transcription with the patient, who gave verbal consent to proceed.  History of Present Illness         The patient, with a history of heart disease, diabetes, anemia, and a prolapsed rectum, presents with recurrent urinary tract infections (UTIs). They noticed blood in their urine and experienced aching in their lower back and abdomen. They have been testing their urine at home, which showed a positive result for leukocytes. The patient believes the recurrent UTIs are due to restricted fluid intake, which is a result of managing their heart condition. They have been advised to limit their fluid intake to a quart and a half per day, which includes the water content in foods like coffee and melon. The patient also mentions a previous heart attack and a change in medication from Lasix to Torasemide, which has improved their kidney function. They are currently taking iron supplements and Metamucil for bowel regulation.      01/31/2023    8:36 AM 11/09/2022   11:02 AM 10/31/2022    8:12 AM  Depression screen PHQ 2/9  Decreased Interest 0 1 1  Down, Depressed, Hopeless 0 0 1  PHQ - 2 Score 0 1 2  Altered sleeping 3 1 1   Tired, decreased energy 1 1 1   Change in appetite 1 1 1   Feeling bad or failure about yourself  0  1  Trouble concentrating 0 0 0  Moving slowly or fidgety/restless 0 0 0  Suicidal thoughts 0 0 0  PHQ-9 Score 5 4 6   Difficult doing work/chores  Not difficult at all Not difficult at all      01/31/2023    8:35 AM 10/21/2022   10:38 AM  GAD 7 : Generalized Anxiety Score  Nervous, Anxious, on Edge 1 1  Control/stop worrying 1 1  Worry too much - different things 1 1  Trouble relaxing  0 1  Restless 0 1  Easily annoyed or irritable 0 1  Afraid - awful might happen 1 1  Total GAD 7 Score 4 7  Anxiety Difficulty  Somewhat difficult    Medications: Outpatient Medications Prior to Visit  Medication Sig   Accu-Chek FastClix Lancets MISC TEST FASTING BLOOD SUGAR EVERY DAY   ACCU-CHEK GUIDE test strip TEST FASTING BLOOD SUGAR EVERY DAY AS DIRECTED   aspirin 81 MG EC tablet Take 81 mg by mouth daily.   Blood Glucose Monitoring Suppl (ACCU-CHEK GUIDE) w/Device KIT USE AS DIRECTED TO CHECK FASTING BLOOD SUGAR DAILY   clopidogrel (PLAVIX) 75 MG tablet Take 1 tablet (75 mg total) by mouth daily.   cyclobenzaprine (FLEXERIL) 5 MG tablet    diltiazem (CARDIZEM CD) 180 MG 24 hr capsule Take 180 mg by mouth in the morning and at bedtime.   Lancets Misc. (ACCU-CHEK FASTCLIX LANCET) KIT 1 each by Does not apply route as directed.   latanoprost (XALATAN) 0.005 % ophthalmic solution Place 1 drop into both eyes at bedtime.    losartan (COZAAR) 25 MG tablet Take 1 tablet (25 mg total) by mouth daily.   Magnesium 250 MG TABS Take 250 mg by mouth 3 (three) times daily.   metFORMIN (GLUCOPHAGE) 500 MG tablet Take  1 tablet (500 mg total) by mouth 2 (two) times daily with a meal. (Patient taking differently: Take 500 mg by mouth 2 (two) times daily with a meal. Take 1000 mg 2x daily)   nitroGLYCERIN (NITROSTAT) 0.4 MG SL tablet Place 1 tablet (0.4 mg total) under the tongue every 5 (five) minutes as needed for chest pain.   potassium chloride SA (KLOR-CON M20) 20 MEQ tablet Take 1.5 tablets (30 mEq total) by mouth daily.   pravastatin (PRAVACHOL) 10 MG tablet Take 10 mg by mouth daily.   torsemide (DEMADEX) 20 MG tablet Take 1.5 tablets (30 mg total) by mouth daily.   vitamin B-12 (CYANOCOBALAMIN) 1000 MCG tablet Take 1,000 mcg by mouth daily.   Facility-Administered Medications Prior to Visit  Medication Dose Route Frequency Provider   ticagrelor Marden Noble) tablet    PRN End, Cristal Deer, MD     Review of Systems Except see HPI   {Insert previous labs (optional):23779} {See past labs  Heme  Chem  Endocrine  Serology  Results Review (optional):1}   Objective    There were no vitals taken for this visit. {Insert last BP/Wt (optional):23777}{See vitals history (optional):1}   Physical Exam   No results found for any visits on 02/24/23.  Assessment & Plan       Urinary Tract Infection Positive leukocyte esterase on home UTI test strip. Reports of pressure and aching in the lower abdomen. No fever or flank pain. History of recurrent UTIs, possibly related to restricted fluid intake due to heart condition. -Prescribe antibiotics for 7 days, with instructions to stop after 5 days if symptoms resolve. -Consider referral to urology or urogynecology if UTIs continue to recur.  Rectal Prolapse No current issues reported. No need for stool sample at this time.  Fluid Intake Restricted to 1.5 quarts per day due to heart condition. Patient plans to temporarily increase water intake to manage UTI, and will monitor for fluid retention.  Iron Deficiency Anemia Taking iron supplements as instructed by Dr. Leonard Schwartz. No current issues reported.  Heart Condition Managed with Torasemide, which has improved kidney function. No current issues reported.  Diabetes Managed with daily blood sugar checks. No current issues reported.     No follow-ups on file.     The patient was advised to call back or seek an in-person evaluation if the symptoms worsen or if the condition fails to improve as anticipated.  I discussed the assessment and treatment plan with the patient. The patient was provided an opportunity to ask questions and all were answered. The patient agreed with the plan and demonstrated an understanding of the instructions.  I, Debera Lat, PA-C have reviewed all documentation for this visit. The documentation on  02/24/23  for the exam, diagnosis, procedures, and orders are  all accurate and complete.  Debera Lat, [facility], MMS [facility] [phone] (phone) [phone] (fax)  [facility]

## 2023-02-25 LAB — URINALYSIS, MICROSCOPIC ONLY
Bacteria, UA: NONE SEEN
Casts: NONE SEEN /[LPF]
RBC, Urine: NONE SEEN /[HPF] (ref 0–2)
WBC, UA: NONE SEEN /[HPF] (ref 0–5)

## 2023-02-27 DIAGNOSIS — I10 Essential (primary) hypertension: Secondary | ICD-10-CM | POA: Diagnosis not present

## 2023-02-27 DIAGNOSIS — E119 Type 2 diabetes mellitus without complications: Secondary | ICD-10-CM | POA: Diagnosis not present

## 2023-02-27 DIAGNOSIS — Z955 Presence of coronary angioplasty implant and graft: Secondary | ICD-10-CM | POA: Diagnosis not present

## 2023-02-27 DIAGNOSIS — I2510 Atherosclerotic heart disease of native coronary artery without angina pectoris: Secondary | ICD-10-CM | POA: Diagnosis not present

## 2023-02-27 DIAGNOSIS — R42 Dizziness and giddiness: Secondary | ICD-10-CM | POA: Diagnosis not present

## 2023-02-27 DIAGNOSIS — I252 Old myocardial infarction: Secondary | ICD-10-CM | POA: Diagnosis not present

## 2023-02-27 DIAGNOSIS — R079 Chest pain, unspecified: Secondary | ICD-10-CM | POA: Diagnosis not present

## 2023-02-27 DIAGNOSIS — R000 Tachycardia, unspecified: Secondary | ICD-10-CM | POA: Diagnosis not present

## 2023-02-27 DIAGNOSIS — D649 Anemia, unspecified: Secondary | ICD-10-CM | POA: Diagnosis not present

## 2023-03-02 LAB — URINE CULTURE

## 2023-03-06 ENCOUNTER — Encounter: Payer: Self-pay | Admitting: Family Medicine

## 2023-03-06 DIAGNOSIS — I5022 Chronic systolic (congestive) heart failure: Secondary | ICD-10-CM

## 2023-03-08 DIAGNOSIS — H353132 Nonexudative age-related macular degeneration, bilateral, intermediate dry stage: Secondary | ICD-10-CM | POA: Diagnosis not present

## 2023-03-08 DIAGNOSIS — Z961 Presence of intraocular lens: Secondary | ICD-10-CM | POA: Diagnosis not present

## 2023-03-08 DIAGNOSIS — I5022 Chronic systolic (congestive) heart failure: Secondary | ICD-10-CM | POA: Diagnosis not present

## 2023-03-08 DIAGNOSIS — E119 Type 2 diabetes mellitus without complications: Secondary | ICD-10-CM | POA: Diagnosis not present

## 2023-03-08 DIAGNOSIS — H401131 Primary open-angle glaucoma, bilateral, mild stage: Secondary | ICD-10-CM | POA: Diagnosis not present

## 2023-03-08 DIAGNOSIS — Z0100 Encounter for examination of eyes and vision without abnormal findings: Secondary | ICD-10-CM | POA: Diagnosis not present

## 2023-03-08 LAB — HM DIABETES EYE EXAM

## 2023-03-09 LAB — MAGNESIUM: Magnesium: 2.1 mg/dL (ref 1.6–2.3)

## 2023-03-29 DIAGNOSIS — M542 Cervicalgia: Secondary | ICD-10-CM | POA: Diagnosis not present

## 2023-04-21 ENCOUNTER — Ambulatory Visit: Payer: Medicare HMO | Admitting: Podiatry

## 2023-04-21 ENCOUNTER — Encounter: Payer: Self-pay | Admitting: Podiatry

## 2023-04-21 DIAGNOSIS — D2372 Other benign neoplasm of skin of left lower limb, including hip: Secondary | ICD-10-CM | POA: Diagnosis not present

## 2023-04-21 NOTE — Progress Notes (Signed)
Chief Complaint  Patient presents with   Callouses    "I have a stopped up sweat gland again."    Subjective: 82 y.o. female presenting to the office today for evaluation of pain and tenderness associated to a symptomatic skin lesion to the plantar aspect of the left forefoot.  She has had this condition a few years prior and it was debrided and felt significantly better.  She presents for further treatment evaluation   Past Medical History:  Diagnosis Date   Allergy    Arthritis    Bundle branch block, right    C7 cervical fracture (HCC)    CAD (coronary artery disease)    a. 06/2005 Cath: LM nl, LAD 38m, LCX nl, RCA 20ost->med rx; b. 07/2022 NSTEMI/PCI: LM 40d, LAD 50p, 40p/m, 60m (2.0x19 Onyx Frontier DES), RI 20, LCX small, mild/diff dzs, RCA 60ost, 65p, 60d. EF 25-35%.   CHF (congestive heart failure) (HCC)    Chronic HFrEF (heart failure with reduced ejection fraction) (HCC)    a. 10/2011 Echo: EF 65-70%; b. 07/2022 Echo: EF 35-40%, glob HK, basal wall motion best preserved. GrI DD, nl RV fxn, mild MR.   Diabetes mellitus    Diffuse cystic mastopathy    Dissection, aorta (HCC)    Dr.Dew follows   Diverticulosis    Family history of malignant neoplasm of gastrointestinal tract    Glaucoma 2005   Heart murmur    History of bronchitis    History of pancreatitis 2005   Hyperlipidemia LDL goal <70    Hypertension 1982   Ischemic cardiomyopathy    a. 07/2022 Echo: EF 35-40%.   Personal history of tobacco use, presenting hazards to health    RBBB    Ulcer     Past Surgical History:  Procedure Laterality Date   ABDOMINAL HYSTERECTOMY  1991   ANTERIOR CERVICAL CORPECTOMY N/A 03/30/2020   Procedure: CORPECTOMY CERVICAL FOUR - CERVICAL FIVE;  Surgeon: Tressie Stalker, MD;  Location: [facility];  Service: Neurosurgery;  Laterality: N/A;  anterior   APPENDECTOMY     BACK SURGERY  1982   for lumbar ruptured disc. No hardware, no discectomy, no fusion   BLADDER SURGERY     BREAST  BIOPSY Right 1980's   benign   BREAST EXCISIONAL BIOPSY Right 1980s   benign   BREAST SURGERY     biopsy   CARDIAC CATHETERIZATION     Dr. Lady Gary, few years ago   CATARACT EXTRACTION W/PHACO Left 08/31/2015   Procedure: CATARACT EXTRACTION PHACO AND INTRAOCULAR LENS PLACEMENT (IOC);  Surgeon: Sherald Hess, MD;  Location: [facility];  Service: Ophthalmology;  Laterality: Left;  DIABETIC - oral meds   CATARACT EXTRACTION W/PHACO Right 10/12/2015   Procedure: CATARACT EXTRACTION PHACO AND INTRAOCULAR LENS PLACEMENT (IOC) right eye;  Surgeon: Sherald Hess, MD;  Location: [facility];  Service: Ophthalmology;  Laterality: Right;  DIABETIC - oral meds   CHOLECYSTECTOMY  1994   COLONOSCOPY  2008,02/22/2012   Dr. Sankar-2013   COLONOSCOPY WITH PROPOFOL N/A 04/04/2017   Procedure: COLONOSCOPY WITH PROPOFOL;  Surgeon: [name], MD;  Location: [facility] ENDOSCOPY;  Service: Endoscopy;  Laterality: N/A;   CORONARY STENT INTERVENTION N/A 07/08/2022   Procedure: CORONARY STENT INTERVENTION;  Surgeon: Yvonne Kendall, MD;  Location: [facility] INVASIVE CV LAB;  Service: Cardiovascular;  Laterality: N/A;   POSTERIOR CERVICAL FUSION/FORAMINOTOMY N/A 04/01/2020   Procedure: POSTERIOR CERVICAL FUSION/FORAMINOTOMY CERVICAL FOUR- CERVICAL SIX;  Surgeon: Tressie Stalker, MD;  Location: [facility]  OR;  Service: Neurosurgery;  Laterality: N/A;   REFRACTIVE SURGERY     RIGHT/LEFT HEART CATH AND CORONARY ANGIOGRAPHY N/A 07/08/2022   Procedure: RIGHT/LEFT HEART CATH AND CORONARY ANGIOGRAPHY;  Surgeon: Yvonne Kendall, MD;  Location: [facility] INVASIVE CV LAB;  Service: Cardiovascular;  Laterality: N/A;    Allergies  Allergen Reactions   Beta Adrenergic Blockers     Chest pressure/difficulty breathing   Corlanor [Ivabradine] Other (See Comments)    Saw flashing lights   Jardiance [Empagliflozin]     Muscle cramps   Levaquin [Levofloxacin In D5w]     Low blood sugar; does take this when  she needs it. She just makes sure to control her BS to avoid hypoglycemia.   Lisinopril Cough   Zetia [Ezetimibe]     Lethargic    Prevnar [Pneumococcal 13-Val Conj Vacc] Rash    Joint Pain   Sulfa Antibiotics Rash    "makes my skin raw"     Objective:  Physical Exam General: Alert and oriented x3 in no acute distress  Dermatology: Hyperkeratotic lesion(s) present on the plantar aspect of the left forefoot. Pain on palpation with a central nucleated core noted. Skin is warm, dry and supple bilateral lower extremities. Negative for open lesions or macerations.  Vascular: Palpable pedal pulses bilaterally. No edema or erythema noted. Capillary refill within normal limits.  Neurological: Grossly intact via light touch  Musculoskeletal Exam: Pain on palpation at the keratotic lesion(s) noted. Range of motion within normal limits bilateral. Muscle strength 5/5 in all groups bilateral.  Assessment: 1.  Symptomatic benign skin lesion left foot   Plan of Care:  -Patient evaluated -Excisional debridement of keratoic lesion(s) using a chisel blade was performed without incident.  -Salicylic acid applied with a bandaid -Return to the clinic PRN.   Felecia Shelling, DPM [facility]  Dr. Felecia Shelling, DPM    [address] [place], [place] [address]                Office [phone]  Fax [phone]

## 2023-05-05 ENCOUNTER — Ambulatory Visit: Payer: Medicare HMO | Admitting: Podiatry

## 2023-05-09 DIAGNOSIS — D509 Iron deficiency anemia, unspecified: Secondary | ICD-10-CM | POA: Diagnosis not present

## 2023-06-20 DIAGNOSIS — H43813 Vitreous degeneration, bilateral: Secondary | ICD-10-CM | POA: Diagnosis not present

## 2023-06-20 DIAGNOSIS — Z7984 Long term (current) use of oral hypoglycemic drugs: Secondary | ICD-10-CM | POA: Diagnosis not present

## 2023-06-20 DIAGNOSIS — H353132 Nonexudative age-related macular degeneration, bilateral, intermediate dry stage: Secondary | ICD-10-CM | POA: Diagnosis not present

## 2023-06-20 DIAGNOSIS — Z961 Presence of intraocular lens: Secondary | ICD-10-CM | POA: Diagnosis not present

## 2023-06-20 DIAGNOSIS — E119 Type 2 diabetes mellitus without complications: Secondary | ICD-10-CM | POA: Diagnosis not present

## 2023-06-20 DIAGNOSIS — H401131 Primary open-angle glaucoma, bilateral, mild stage: Secondary | ICD-10-CM | POA: Diagnosis not present

## 2023-06-20 LAB — HM DIABETES EYE EXAM

## 2023-06-27 ENCOUNTER — Ambulatory Visit: Payer: HMO | Admitting: Podiatry

## 2023-06-27 DIAGNOSIS — Q828 Other specified congenital malformations of skin: Secondary | ICD-10-CM

## 2023-06-27 NOTE — Progress Notes (Unsigned)
 Subjective:  Patient ID: Jessica Wheeler, female    DOB: 1941/03/28,  MRN: [id number]  Chief Complaint  Patient presents with   Callouses    83 y.o. female presents with the above complaint.  Patient presents with left submetatarsal 3 porokeratotic lesion painful to touch is progressive gotten worse worse with ambulation worse with pressure patient would like to have it debrided down   Review of Systems: Negative except as noted in the HPI. Denies N/V/F/Ch.  Past Medical History:  Diagnosis Date   Allergy    Arthritis    Bundle branch block, right    C7 cervical fracture (HCC)    CAD (coronary artery disease)    a. 06/2005 Cath: LM nl, LAD 21m, LCX nl, RCA 20ost->med rx; b. 07/2022 NSTEMI/PCI: LM 40d, LAD 50p, 40p/m, 60m (2.0x19 Onyx Frontier DES), RI 20, LCX small, mild/diff dzs, RCA 60ost, 65p, 60d. EF 25-35%.   CHF (congestive heart failure) (HCC)    Chronic HFrEF (heart failure with reduced ejection fraction) (HCC)    a. 10/2011 Echo: EF 65-70%; b. 07/2022 Echo: EF 35-40%, glob HK, basal wall motion best preserved. GrI DD, nl RV fxn, mild MR.   Diabetes mellitus    Diffuse cystic mastopathy    Dissection, aorta (HCC)    Dr.Dew follows   Diverticulosis    Family history of malignant neoplasm of gastrointestinal tract    Glaucoma 2005   Heart murmur    History of bronchitis    History of pancreatitis 2005   Hyperlipidemia LDL goal <70    Hypertension 1982   Ischemic cardiomyopathy    a. 07/2022 Echo: EF 35-40%.   Personal history of tobacco use, presenting hazards to health    RBBB    Ulcer     Current Outpatient Medications:    Accu-Chek FastClix Lancets MISC, TEST FASTING BLOOD SUGAR EVERY DAY, Disp: 102 each, Rfl: 3   ACCU-CHEK GUIDE test strip, TEST FASTING BLOOD SUGAR EVERY DAY AS DIRECTED, Disp: 100 strip, Rfl: 3   aspirin 81 MG EC tablet, Take 81 mg by mouth daily., Disp: , Rfl:    Blood Glucose Monitoring Suppl (ACCU-CHEK GUIDE) w/Device KIT, USE AS DIRECTED TO  CHECK FASTING BLOOD SUGAR DAILY, Disp: 1 kit, Rfl: 0   cephALEXin (KEFLEX) 500 MG capsule, Take 1 capsule (500 mg total) by mouth 2 (two) times daily., Disp: 14 capsule, Rfl: 0   clopidogrel (PLAVIX) 75 MG tablet, Take 1 tablet (75 mg total) by mouth daily., Disp: 90 tablet, Rfl: 1   cyclobenzaprine (FLEXERIL) 5 MG tablet, , Disp: , Rfl:    diltiazem (CARDIZEM CD) 180 MG 24 hr capsule, Take 180 mg by mouth in the morning and at bedtime., Disp: , Rfl:    Lancets Misc. (ACCU-CHEK FASTCLIX LANCET) KIT, 1 each by Does not apply route as directed., Disp: 1 kit, Rfl: 1   latanoprost (XALATAN) 0.005 % ophthalmic solution, Place 1 drop into both eyes at bedtime. , Disp: , Rfl: 3   losartan (COZAAR) 25 MG tablet, Take 1 tablet (25 mg total) by mouth daily., Disp: 90 tablet, Rfl: 3   Magnesium 250 MG TABS, Take 250 mg by mouth 3 (three) times daily., Disp: , Rfl:    metFORMIN (GLUCOPHAGE) 500 MG tablet, Take 1 tablet (500 mg total) by mouth 2 (two) times daily with a meal. (Patient taking differently: Take 500 mg by mouth 2 (two) times daily with a meal. Take 1000 mg 2x daily), Disp: 180 tablet, Rfl: 3  nitroGLYCERIN (NITROSTAT) 0.4 MG SL tablet, Place 1 tablet (0.4 mg total) under the tongue every 5 (five) minutes as needed for chest pain., Disp: 100 tablet, Rfl: 3   potassium chloride SA (KLOR-CON M20) 20 MEQ tablet, Take 1.5 tablets (30 mEq total) by mouth daily., Disp: 135 tablet, Rfl: 3   pravastatin (PRAVACHOL) 10 MG tablet, Take 10 mg by mouth daily., Disp: , Rfl:    torsemide (DEMADEX) 20 MG tablet, Take 1.5 tablets (30 mg total) by mouth daily., Disp: 135 tablet, Rfl: 1   vitamin B-12 (CYANOCOBALAMIN) 1000 MCG tablet, Take 1,000 mcg by mouth daily., Disp: , Rfl:  No current facility-administered medications for this visit.  Facility-Administered Medications Ordered in Other Visits:    ticagrelor (BRILINTA) tablet, , , PRN, End, Christopher, MD, 180 mg at 07/08/22 1605  Social History   Tobacco  Use  Smoking Status Former   Current packs/day: 0.00   Average packs/day: 1 pack/day for 15.0 years (15.0 ttl pk-yrs)   Types: Cigarettes   Start date: 08/02/1988   Quit date: 08/03/2003   Years since quitting: 19.9  Smokeless Tobacco Never    Allergies  Allergen Reactions   Beta Adrenergic Blockers     Chest pressure/difficulty breathing   Corlanor [Ivabradine] Other (See Comments)    Saw flashing lights   Jardiance [Empagliflozin]     Muscle cramps   Levaquin [Levofloxacin In D5w]     Low blood sugar; does take this when she needs it. She just makes sure to control her BS to avoid hypoglycemia.   Lisinopril Cough   Zetia [Ezetimibe]     Lethargic    Prevnar [Pneumococcal 13-Val Conj Vacc] Rash    Joint Pain   Sulfa Antibiotics Rash    "makes my skin raw"   Objective:  There were no vitals filed for this visit. There is no height or weight on file to calculate BMI. Constitutional Well developed. Well nourished.  Vascular Dorsalis pedis pulses palpable bilaterally. Posterior tibial pulses palpable bilaterally. Capillary refill normal to all digits.  No cyanosis or clubbing noted. Pedal hair growth normal.  Neurologic Normal speech. Oriented to person, place, and time. Epicritic sensation to light touch grossly present bilaterally.  Dermatologic Left submetatarsal 3 porokeratotic lesion with central nucleated core noted.  Pain on palpation.  Orthopedic: Normal joint ROM without pain or crepitus bilaterally. No visible deformities. No bony tenderness.   Radiographs: None Assessment:   1. Porokeratosis    Plan:  Patient was evaluated and treated and all questions answered.  Left submetatarsal 3 porokeratosis -All questions and concerns were discussed with the patient extensive due to given the amount of pain that she is having she will benefit from debridement of the lesion the lesion was debrided down to healthy scar tissue.  No complication noted no pinpoint  bleeding noted -Shoe gear modification discussed  No follow-ups on file.

## 2023-07-10 ENCOUNTER — Encounter: Payer: Self-pay | Admitting: Family Medicine

## 2023-07-10 DIAGNOSIS — N390 Urinary tract infection, site not specified: Secondary | ICD-10-CM

## 2023-07-10 NOTE — Telephone Encounter (Signed)
 Please see if anyone has acutes sooner than 7 days in the office for this episode. Also place urology referral for recurrent UTIs.

## 2023-07-11 DIAGNOSIS — N390 Urinary tract infection, site not specified: Secondary | ICD-10-CM | POA: Diagnosis not present

## 2023-07-11 DIAGNOSIS — R3989 Other symptoms and signs involving the genitourinary system: Secondary | ICD-10-CM | POA: Diagnosis not present

## 2023-07-11 NOTE — Telephone Encounter (Signed)
 Pt already scheduled an appt with urgent care for this afternoon.  She would still like you to place a urology referral if you think it would be beneficial to her.  She states that the UTI's "make her feel so bad."  She states that [facility] is where she would like the referral sent.

## 2023-07-13 ENCOUNTER — Other Ambulatory Visit
Admission: RE | Admit: 2023-07-13 | Discharge: 2023-07-13 | Disposition: A | Discharge status: Home / Self Care | Source: Ambulatory Visit | Attending: Internal Medicine | Admitting: Internal Medicine

## 2023-07-13 DIAGNOSIS — I10 Essential (primary) hypertension: Secondary | ICD-10-CM | POA: Diagnosis not present

## 2023-07-13 DIAGNOSIS — K219 Gastro-esophageal reflux disease without esophagitis: Secondary | ICD-10-CM | POA: Diagnosis not present

## 2023-07-13 DIAGNOSIS — I252 Old myocardial infarction: Secondary | ICD-10-CM | POA: Diagnosis not present

## 2023-07-13 DIAGNOSIS — E119 Type 2 diabetes mellitus without complications: Secondary | ICD-10-CM | POA: Diagnosis not present

## 2023-07-13 DIAGNOSIS — R600 Localized edema: Secondary | ICD-10-CM | POA: Diagnosis not present

## 2023-07-13 DIAGNOSIS — Z955 Presence of coronary angioplasty implant and graft: Secondary | ICD-10-CM | POA: Diagnosis not present

## 2023-07-13 DIAGNOSIS — I2510 Atherosclerotic heart disease of native coronary artery without angina pectoris: Secondary | ICD-10-CM | POA: Diagnosis not present

## 2023-07-13 DIAGNOSIS — R0602 Shortness of breath: Secondary | ICD-10-CM | POA: Insufficient documentation

## 2023-07-13 DIAGNOSIS — R000 Tachycardia, unspecified: Secondary | ICD-10-CM | POA: Diagnosis not present

## 2023-07-13 DIAGNOSIS — D649 Anemia, unspecified: Secondary | ICD-10-CM | POA: Diagnosis not present

## 2023-07-13 DIAGNOSIS — R079 Chest pain, unspecified: Secondary | ICD-10-CM | POA: Diagnosis not present

## 2023-07-13 DIAGNOSIS — R42 Dizziness and giddiness: Secondary | ICD-10-CM | POA: Diagnosis not present

## 2023-07-13 LAB — BRAIN NATRIURETIC PEPTIDE: B Natriuretic Peptide: 120.6 pg/mL — ABNORMAL HIGH (ref 0.0–100.0)

## 2023-07-19 ENCOUNTER — Telehealth: Payer: Self-pay | Admitting: Cardiovascular Disease

## 2023-07-19 NOTE — Telephone Encounter (Signed)
 She is already Dr. Windell Hummingbird patient. I only saw her once in 2013

## 2023-07-19 NOTE — Telephone Encounter (Signed)
 Patient wants a provider switch from Dr. Mariah Milling to Dr. Kirke Corin.  Please confirm.

## 2023-08-14 ENCOUNTER — Encounter: Payer: Self-pay | Admitting: Family Medicine

## 2023-08-14 ENCOUNTER — Ambulatory Visit (INDEPENDENT_AMBULATORY_CARE_PROVIDER_SITE_OTHER): Payer: Self-pay | Admitting: Family Medicine

## 2023-08-14 VITALS — BP 124/60 | HR 65 | Ht 63.0 in | Wt 132.4 lb

## 2023-08-14 DIAGNOSIS — Z0001 Encounter for general adult medical examination with abnormal findings: Secondary | ICD-10-CM | POA: Diagnosis not present

## 2023-08-14 DIAGNOSIS — E785 Hyperlipidemia, unspecified: Secondary | ICD-10-CM

## 2023-08-14 DIAGNOSIS — E1169 Type 2 diabetes mellitus with other specified complication: Secondary | ICD-10-CM | POA: Diagnosis not present

## 2023-08-14 DIAGNOSIS — Z1231 Encounter for screening mammogram for malignant neoplasm of breast: Secondary | ICD-10-CM

## 2023-08-14 DIAGNOSIS — Z87440 Personal history of urinary (tract) infections: Secondary | ICD-10-CM

## 2023-08-14 DIAGNOSIS — E1159 Type 2 diabetes mellitus with other circulatory complications: Secondary | ICD-10-CM | POA: Diagnosis not present

## 2023-08-14 DIAGNOSIS — I5022 Chronic systolic (congestive) heart failure: Secondary | ICD-10-CM | POA: Diagnosis not present

## 2023-08-14 DIAGNOSIS — I152 Hypertension secondary to endocrine disorders: Secondary | ICD-10-CM

## 2023-08-14 DIAGNOSIS — Z7984 Long term (current) use of oral hypoglycemic drugs: Secondary | ICD-10-CM | POA: Diagnosis not present

## 2023-08-14 DIAGNOSIS — Z0000 Encounter for general adult medical examination without abnormal findings: Secondary | ICD-10-CM

## 2023-08-14 NOTE — Assessment & Plan Note (Signed)
 Her blood pressure well controlled on manual recheck. She is on multiple antihypertensive medications. - continue current medications

## 2023-08-14 NOTE — Assessment & Plan Note (Signed)
 She has chronic systolic CHF with dilated cardiomyopathy, managed with torsemide. She monitors her weight regularly, with concerns about weight fluctuations and diuretic impact on electrolytes. She reports weakness at a dry weight of 123 lbs and maintains a weight of 128-130 lbs, which she finds more stable. - Continue monitoring weight and adjust diuretic dosage based on weight trends and electrolyte levels.

## 2023-08-14 NOTE — Assessment & Plan Note (Signed)
 Her diabetes is managed with metformin. She anticipates an A1c below 7% at the next check, indicating good glycemic control. - Order A1c test to assess current glycemic control. - Perform annual urine test for diabetes management. - foot exam today

## 2023-08-14 NOTE — Progress Notes (Signed)
 Complete physical exam   Patient: Jessica Wheeler   DOB: [birth year]   83 y.o. Female  MRN: [id number] Visit Date: 08/14/2023  Today's healthcare provider: Shirlee Latch, MD   Chief Complaint  Patient presents with   Annual Exam    Diet -  Diabetic  Exercise - Walking 2.5 miles 4- 5 days a week for at least 19-22 minutes Feeling - well Sleeping - fairly well Concerns -  recurrent uti's AWV scheduled 08/30/23   Subjective    Jessica Wheeler is a 83 y.o. female who presents today for a complete physical exam.   Discussed the use of AI scribe software for clinical note transcription with the patient, who gave verbal consent to proceed.  History of Present Illness   An 83 year old female with a history of hypertension, coronary artery disease, renal artery stenosis, history of NSTEMI, chronic systolic CHF with dilated cardiomyopathy, type two diabetes, and hyperlipidemia presents for her annual physical. She reports recurrent UTIs and expresses frustration with the difficulty in getting timely appointments for treatment. She has been considering seeing a urologist for preventive measures. She also mentions concerns about her potassium and magnesium levels, noting that her potassium levels have been trending lower. She has been taking 650mg  of potassium daily. She also reports weight fluctuations, with weights ranging from 126 to 130 pounds. She notes feeling weaker when her weight is at the higher end of this range. She also expresses concerns about her heart rate and is considering switching to a different cardiologist. She is considering palliative care as an additional resource.        Last depression screening scores    01/31/2023    8:36 AM 11/09/2022   11:02 AM 10/31/2022    8:12 AM  PHQ 2/9 Scores  PHQ - 2 Score 0 1 2  PHQ- 9 Score 5 4 6    Last fall risk screening    10/21/2022   10:36 AM  Fall Risk   Falls in the past year? 0  Number falls in past yr: 0  Injury  with Fall? 0  Risk for fall due to : No Fall Risks  Follow up Falls evaluation completed        Medications: Outpatient Medications Prior to Visit  Medication Sig   Accu-Chek FastClix Lancets MISC TEST FASTING BLOOD SUGAR EVERY DAY   ACCU-CHEK GUIDE test strip TEST FASTING BLOOD SUGAR EVERY DAY AS DIRECTED   Blood Glucose Monitoring Suppl (ACCU-CHEK GUIDE) w/Device KIT USE AS DIRECTED TO CHECK FASTING BLOOD SUGAR DAILY   clopidogrel (PLAVIX) 75 MG tablet Take 1 tablet (75 mg total) by mouth daily.   cyclobenzaprine (FLEXERIL) 5 MG tablet    diltiazem (CARDIZEM CD) 180 MG 24 hr capsule Take 180 mg by mouth in the morning and at bedtime.   Lancets Misc. (ACCU-CHEK FASTCLIX LANCET) KIT 1 each by Does not apply route as directed.   latanoprost (XALATAN) 0.005 % ophthalmic solution Place 1 drop into both eyes at bedtime.    losartan (COZAAR) 25 MG tablet Take 1 tablet (25 mg total) by mouth daily.   Magnesium 250 MG TABS Take 250 mg by mouth 3 (three) times daily.   metFORMIN (GLUCOPHAGE) 500 MG tablet Take 1 tablet (500 mg total) by mouth 2 (two) times daily with a meal. (Patient taking differently: Take 500 mg by mouth 2 (two) times daily with a meal. Take 1000 mg 2x daily)   nitroGLYCERIN (NITROSTAT) 0.4 MG SL tablet Place  1 tablet (0.4 mg total) under the tongue every 5 (five) minutes as needed for chest pain.   potassium chloride SA (KLOR-CON M20) 20 MEQ tablet Take 1.5 tablets (30 mEq total) by mouth daily.   pravastatin (PRAVACHOL) 10 MG tablet Take 10 mg by mouth daily.   torsemide (DEMADEX) 20 MG tablet Take 1.5 tablets (30 mg total) by mouth daily. (Patient taking differently: Take 30 mg by mouth daily. Taking 2 tablets twice daily)   vitamin B-12 (CYANOCOBALAMIN) 1000 MCG tablet Take 1,000 mcg by mouth daily.   [DISCONTINUED] aspirin 81 MG EC tablet Take 81 mg by mouth daily.   [DISCONTINUED] cephALEXin (KEFLEX) 500 MG capsule Take 1 capsule (500 mg total) by mouth 2 (two) times  daily.   Facility-Administered Medications Prior to Visit  Medication Dose Route Frequency Provider   ticagrelor Marden Noble) tablet    PRN End, Cristal Deer, MD    Review of Systems    Objective    BP 124/60 (BP Location: Left Arm, Patient Position: Sitting, Cuff Size: Normal)   Pulse 65   Ht 5\' 3"  (1.6 m)   Wt 132 lb 6.4 oz (60.1 kg)   SpO2 100%   BMI 23.45 kg/m    Physical Exam Vitals reviewed.  Constitutional:      General: She is not in acute distress.    Appearance: Normal appearance. She is well-developed. She is not diaphoretic.  HENT:     Head: Normocephalic and atraumatic.  Eyes:     General: No scleral icterus.    Conjunctiva/sclera: Conjunctivae normal.  Neck:     Thyroid: No thyromegaly.  Cardiovascular:     Rate and Rhythm: Normal rate and regular rhythm.     Heart sounds: Normal heart sounds. No murmur heard. Pulmonary:     Effort: Pulmonary effort is normal. No respiratory distress.     Breath sounds: Normal breath sounds. No wheezing, rhonchi or rales.  Abdominal:     General: There is no distension.     Palpations: Abdomen is soft.     Tenderness: There is no abdominal tenderness.  Musculoskeletal:     Cervical back: Neck supple.     Right lower leg: No edema.     Left lower leg: No edema.  Lymphadenopathy:     Cervical: No cervical adenopathy.  Skin:    General: Skin is warm and dry.     Findings: No rash.  Neurological:     Mental Status: She is alert and oriented to person, place, and time. Mental status is at baseline.  Psychiatric:        Mood and Affect: Mood normal.        Behavior: Behavior normal.      Diabetic Foot Exam - Simple   Simple Foot Form Diabetic Foot exam was performed with the following findings: Yes 08/14/2023 10:41 AM  Visual Inspection No deformities, no ulcerations, no other skin breakdown bilaterally: Yes Sensation Testing Intact to touch and monofilament testing bilaterally: Yes Pulse Check Posterior  Tibialis and Dorsalis pulse intact bilaterally: Yes Comments      No results found for any visits on 08/14/23.  Assessment & Plan    Routine Health Maintenance and Physical Exam  Exercise Activities and Dietary recommendations  Goals      DIET - EAT MORE FRUITS AND VEGETABLES     DIET - INCREASE WATER INTAKE     Recommend increasing water intake to 6-8 8 oz glasses a day.      RNCM:  Patient concerned about weight loss after HA     Current Barriers:  Care Coordination needs related to incoming call from the patient asking for help about her questions after 4 pound weight loss in a patient with HF and recent MI with stent placement Chronic Disease Management support and education needs related to effective management of HF and post MI  Planned Interventions: Basic overview and discussion of pathophysiology of Heart Failure reviewed. The patient is concerned with weight loss, hacking cough, and shortness of breath. The patient had lost 4 pounds weight loss and was concerned. Reviewed with the patient what worse looks like and to make sure she is staying hydrated. She states that she was just going by her discharge instructions and wanted to make sure she is okay. The patient states she sees the cardiologist in April and will follow up with the care coordination nurse on 07-22-2022. Denies any acute findings this am.  Provided education on low sodium diet Reviewed Heart Failure Action Plan in depth and provided written copy Assessed need for readable accurate scales in home Provided education about placing scale on hard, flat surface Advised patient to weigh each morning after emptying bladder Discussed importance of daily weight and advised patient to weigh and record daily Reviewed role of diuretics in prevention of fluid overload and management of heart failure Discussed the importance of keeping all appointments with provider Provided patient with education about the role of exercise  in the management of heart failure Advised patient to discuss changes in her heart health and when to call for changes in her weight or other sx and sx with provider Screening for signs and symptoms of depression related to chronic disease state  Assessed social determinant of health barriers  Symptom Management: Take medications as prescribed   Attend all scheduled provider appointments Call provider office for new concerns or questions  call the Suicide and Crisis Lifeline: 988 call the [place]  National Suicide Prevention Lifeline: 5301250330 or TTY: 267-821-7856 TTY 432-294-3965) to talk to a trained counselor call 1-800-273-TALK (toll free, 24 hour hotline) if experiencing a Mental Health or Behavioral Health Crisis  call office if I gain more than 2 pounds in one day or 5 pounds in one week use salt in moderation watch for swelling in feet, ankles and legs every day follow rescue plan if symptoms flare-up  Follow Up Plan: Telephone follow up appointment with care management team member scheduled for: 07-22-2022           Immunization History  Administered Date(s) Administered   Fluad Quad(high Dose 65+) 02/26/2019, 01/17/2020, 01/29/2021, 01/20/2022   Fluad Trivalent(High Dose 65+) 01/31/2023   Influenza, High Dose Seasonal PF 02/03/2017, 01/20/2018   Influenza-Unspecified 01/28/2016, 01/30/2017   Moderna Sars-Covid-2 Vaccination 05/15/2019, 06/12/2019, 03/17/2020   Pneumococcal Conjugate-13 09/10/2013, 02/26/2014   Pneumococcal Polysaccharide-23 08/23/2010   Zoster Recombinant(Shingrix) 05/17/2022   Zoster, Live 06/14/2011    Health Maintenance  Topic Date Due   DTaP/Tdap/Td (1 - Tdap) Never done   Zoster Vaccines- Shingrix (2 of 2) 07/12/2022   COVID-19 Vaccine (4 - 2024-25 season) 01/01/2023   Diabetic kidney evaluation - Urine ACR  08/09/2023   Medicare Annual Wellness (AWV)  08/09/2023   HEMOGLOBIN A1C  08/01/2023   INFLUENZA VACCINE  12/01/2023   Diabetic  kidney evaluation - eGFR measurement  01/31/2024   OPHTHALMOLOGY EXAM  06/19/2024   FOOT EXAM  08/13/2024   Pneumonia Vaccine 52+ Years old  Completed   DEXA SCAN  Completed  HPV VACCINES  Aged Out   Meningococcal B Vaccine  Aged Out    Discussed health benefits of physical activity, and encouraged her to engage in regular exercise appropriate for her age and condition.  Problem List Items Addressed This Visit       Cardiovascular and Mediastinum   Hypertension associated with diabetes (HCC)   Relevant Orders   Renal Function Panel   Chronic systolic CHF (congestive heart failure) (HCC)   Relevant Orders   Renal Function Panel     Endocrine   DM (diabetes mellitus) (HCC)   Relevant Orders   Urine microalbumin-creatinine with uACR   Renal Function Panel   Hemoglobin A1c   Hyperlipidemia associated with type 2 diabetes mellitus (HCC)   Other Visit Diagnoses       Encounter for annual physical exam    -  Primary   Relevant Orders   Urine microalbumin-creatinine with uACR   Renal Function Panel   Hemoglobin A1c     Breast cancer screening by mammogram       Relevant Orders   MM 3D SCREENING MAMMOGRAM BILATERAL BREAST          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 Uristat is contraindicated due to her heart condition and medications. She faces challenges in obtaining timely urologist appointments and is concerned about the quality of care at walk-in clinics due to her complex medical history. A urologist is recommended for prevention measures, with virtual visits as an option for acute management. - Maintain appointment with urologist for prevention measures. - Consider virtual visits for UTI management if in-person visits are unavailable. - Encourage her to contact the office if unable to secure timely appointments for UTIs.  Electrolyte Imbalance She reports  low sodium chloride and borderline low potassium levels, with concerns about unassessed magnesium levels. She is on 650 mg daily potassium supplementation. - Order renal function panel to assess magnesium, potassium, and sodium levels.  General Health Maintenance She is due for routine health maintenance, including vaccinations and screenings. She has not received her second shingles vaccine and is hesitant about the tetanus vaccine due to pain. She has a history of pre-cancerous breast surgeries and is due for a mammogram. - Order mammogram for breast cancer screening. - Discuss and consider administering the second shingles vaccine. - Advise on tetanus vaccine if she experiences a cut or animal bite.  Goals of Care She inquired about palliative care services for additional support, particularly for managing UTIs. She does not currently require hospice care and is managing well with her current support system. A friend who is a hospice nurse provides regular check-ins. - Consider palliative care services if additional support is needed in the future.  Follow-up She requires follow-up for routine health maintenance and chronic condition management. - Schedule follow-up appointment in six months for regular check-up and review of lab results.        Return in about 6 months (around 02/13/2024) for chronic disease f/u.     Aden Agreste, MD  [facility] [phone] (phone) [phone] (fax)  [facility]

## 2023-08-15 ENCOUNTER — Encounter: Payer: Self-pay | Admitting: Family Medicine

## 2023-08-15 LAB — RENAL FUNCTION PANEL
Albumin: 4.4 g/dL (ref 3.7–4.7)
BUN/Creatinine Ratio: 24 (ref 12–28)
BUN: 21 mg/dL (ref 8–27)
CO2: 23 mmol/L (ref 20–29)
Calcium: 9.9 mg/dL (ref 8.7–10.3)
Chloride: 99 mmol/L (ref 96–106)
Creatinine, Ser: 0.86 mg/dL (ref 0.57–1.00)
Glucose: 136 mg/dL — ABNORMAL HIGH (ref 70–99)
Phosphorus: 3.2 mg/dL (ref 3.0–4.3)
Potassium: 4.1 mmol/L (ref 3.5–5.2)
Sodium: 138 mmol/L (ref 134–144)
eGFR: 67 mL/min/{1.73_m2} (ref >59)

## 2023-08-15 LAB — MICROALBUMIN / CREATININE URINE RATIO
Creatinine, Urine: 80.1 mg/dL
Microalb/Creat Ratio: 13 mg/g{creat} (ref 0–29)
Microalbumin, Urine: 10.3 ug/mL

## 2023-08-15 LAB — HEMOGLOBIN A1C
Est. average glucose Bld gHb Est-mCnc: 148 mg/dL
Hgb A1c MFr Bld: 6.8 % — ABNORMAL HIGH (ref 4.8–5.6)

## 2023-08-23 ENCOUNTER — Ambulatory Visit: Admitting: Urology

## 2023-08-23 VITALS — BP 145/63 | HR 68 | Ht 63.0 in | Wt 130.8 lb

## 2023-08-23 DIAGNOSIS — N8111 Cystocele, midline: Secondary | ICD-10-CM

## 2023-08-23 DIAGNOSIS — Z862 Personal history of diseases of the blood and blood-forming organs and certain disorders involving the immune mechanism: Secondary | ICD-10-CM | POA: Diagnosis not present

## 2023-08-23 DIAGNOSIS — N390 Urinary tract infection, site not specified: Secondary | ICD-10-CM | POA: Diagnosis not present

## 2023-08-23 LAB — MICROSCOPIC EXAMINATION: Bacteria, UA: NONE SEEN

## 2023-08-23 LAB — URINALYSIS, COMPLETE
Bilirubin, UA: NEGATIVE
Glucose, UA: NEGATIVE
Ketones, UA: NEGATIVE
Leukocytes,UA: NEGATIVE
Nitrite, UA: NEGATIVE
Protein,UA: NEGATIVE
RBC, UA: NEGATIVE
Specific Gravity, UA: 1.015 (ref 1.005–1.030)
Urobilinogen, Ur: 0.2 mg/dL (ref 0.2–1.0)
pH, UA: 6.5 (ref 5.0–7.5)

## 2023-08-23 LAB — BLADDER SCAN AMB NON-IMAGING: Scan Result: 154

## 2023-08-23 MED ORDER — ESTRADIOL 0.1 MG/GM VA CREA
TOPICAL_CREAM | VAGINAL | 12 refills | Status: AC
Start: 2023-08-23 — End: ?
  Filled 2023-11-23: qty 42.5, 90d supply, fill #0

## 2023-08-23 NOTE — Progress Notes (Signed)
 Elfrieda Grise Plume,acting as a scribe for Dustin Gimenez, MD.,have documented all relevant documentation on the behalf of Dustin Gimenez, MD,as directed by  Dustin Gimenez, MD while in the presence of Dustin Gimenez, MD.  08/23/23 2:08 PM   Jessica Wheeler 04/03/1941 [id number]  Referring provider: Mazie Speed, MD [address] [place],  [place] [address]  Chief Complaint  Patient presents with   Establish Care   Recurrent UTI    HPI:  83 year old female with a personal history of myocardial infarction (March 2024), anemia, and remote lumbar disc rupture (1982) presents for evaluation of recurrent UTIs, review of records, and treatment recommendations.   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 She notes that since her heart attack in March 2024, her fluid intake has been limited and she no longer uses Urostat. She describes that her UTIs are difficult to detect due to lack of typical symptoms (no burning, no pressure), likely related to her prior ruptured disc and resultant decreased sensation. She often only recognizes infection when she develops systemic symptoms such as fevers and malaise, sometimes severe enough to be threatened with hospitalization.   She reports that her most recent documented UTI was in July 2024, with a low colony count of E. coli; otherwise, urinalyses and cultures over the past year have been unremarkable except for one culture in October 2024 that grew a small amount of group B strep, likely a contaminant. She has been treated empirically with antibiotics (including Cipro ) at walk-in clinics, but review of her records shows only one true infection in the past year.   She also reports a longstanding history of pelvic organ prolapse, specifically a large cystocele, with associated pelvic floor laxity noted on imaging (CT from 2021). She describes a  vaginal bulge that sometimes protrudes externally and requires manual reduction, as well as chronic vaginal bleeding/spotting on her undergarments, which has been ongoing since at least March 2024.   She was previously anemic but recent labs show improvement. She denies current sexual activity.   She has a history of hysterectomy with bladder tacking, but the prolapse has recurred.   She is physically active, walking two miles daily and performing yoga and pelvic floor exercises.   She expresses frustration with delays in care and uncertainty about the true cause of her symptoms. She is interested in guidance for management and prevention of further episodes. She denies desire for surgical intervention at this time. She is open to non-surgical options, including topical estrogen and pessary if needed.  Results for orders placed or performed in visit on 08/23/23  Microscopic Examination   Urine  Result Value Ref Range   WBC, UA 0-5 0 - 5 /hpf   RBC, Urine 0-2 0 - 2 /hpf   Epithelial Cells (non renal) 0-10 0 - 10 /hpf   Casts Present (A) None seen /lpf   Cast Type Hyaline casts N/A   Bacteria, UA None seen None seen/Few  Urinalysis, Complete  Result Value Ref Range   Specific Gravity, UA 1.015 1.005 - 1.030   pH, UA 6.5 5.0 - 7.5   Color, UA Yellow Yellow   Appearance Ur Clear Clear   Leukocytes,UA Negative Negative   Protein,UA Negative Negative/Trace   Glucose, UA Negative Negative   Ketones, UA Negative Negative   RBC, UA Negative Negative   Bilirubin, UA Negative Negative  Urobilinogen, Ur 0.2 0.2 - 1.0 mg/dL   Nitrite, UA Negative Negative   Microscopic Examination See below:   BLADDER SCAN AMB NON-IMAGING  Result Value Ref Range   Scan Result 154 ml      PMH: Past Medical History:  Diagnosis Date   Allergy    Arthritis    Bundle branch block, right    C7 cervical fracture (HCC)    CAD (coronary artery disease)    a. 06/2005 Cath: LM nl, LAD 76m, LCX nl, RCA  20ost->med rx; b. 07/2022 NSTEMI/PCI: LM 40d, LAD 50p, 40p/m, 56m (2.0x19 Onyx Frontier DES), RI 20, LCX small, mild/diff dzs, RCA 60ost, 65p, 60d. EF 25-35%.   CHF (congestive heart failure) (HCC)    Chronic HFrEF (heart failure with reduced ejection fraction) (HCC)    a. 10/2011 Echo: EF 65-70%; b. 07/2022 Echo: EF 35-40%, glob HK, basal wall motion best preserved. GrI DD, nl RV fxn, mild MR.   Diabetes mellitus    Diffuse cystic mastopathy    Dissection, aorta (HCC)    Dr.Dew follows   Diverticulosis    Family history of malignant neoplasm of gastrointestinal tract    Glaucoma 2005   Heart murmur    History of bronchitis    History of pancreatitis 2005   Hyperlipidemia LDL goal <70    Hypertension 1982   Ischemic cardiomyopathy    a. 07/2022 Echo: EF 35-40%.   Personal history of tobacco use, presenting hazards to health    RBBB    Ulcer     Surgical History: Past Surgical History:  Procedure Laterality Date   ABDOMINAL HYSTERECTOMY  1991   ANTERIOR CERVICAL CORPECTOMY N/A 03/30/2020   Procedure: CORPECTOMY CERVICAL FOUR - CERVICAL FIVE;  Surgeon: Garry Kansas, MD;  Location[facility];  Service: Neurosurgery;  Laterality: N/A;  anterior   APPENDECTOMY     BACK SURGERY  1982   for lumbar ruptured disc. No hardware, no discectomy, no fusion   BLADDER SURGERY     BREAST BIOPSY Right 1980's   benign   BREAST EXCISIONAL BIOPSY Right 1980s   benign   BREAST SURGERY     biopsy   CARDIAC CATHETERIZATION     Dr. Cara Chancellor, few years ago   CATARACT EXTRACTION W/PHACO Left 08/31/2015   Procedure: CATARACT EXTRACTION PHACO AND INTRAOCULAR LENS PLACEMENT (IOC);  Surgeon: Billee Buddle, MD;  Location: [facility];  Service: Ophthalmology;  Laterality: Left;  DIABETIC - oral meds   CATARACT EXTRACTION W/PHACO Right 10/12/2015   Procedure: CATARACT EXTRACTION PHACO AND INTRAOCULAR LENS PLACEMENT (IOC) right eye;  Surgeon: Billee Buddle, MD;  Location: [facility];  Service: Ophthalmology;  Laterality: Right;  DIABETIC - oral meds   CHOLECYSTECTOMY  1994   COLONOSCOPY  2008,02/22/2012   Dr. Sankar-2013   COLONOSCOPY WITH PROPOFOL  N/A 04/04/2017   Procedure: COLONOSCOPY WITH PROPOFOL ;  Surgeon: Jerlean Mood, MD;  Location: [facility] ENDOSCOPY;  Service: Endoscopy;  Laterality: N/A;   CORONARY STENT INTERVENTION N/A 07/08/2022   Procedure: CORONARY STENT INTERVENTION;  Surgeon: Sammy Crisp, MD;  Location: [facility] INVASIVE CV LAB;  Service: Cardiovascular;  Laterality: N/A;   POSTERIOR CERVICAL FUSION/FORAMINOTOMY N/A 04/01/2020   Procedure: POSTERIOR CERVICAL FUSION/FORAMINOTOMY CERVICAL FOUR- CERVICAL SIX;  Surgeon: Garry Kansas, MD;  Location: [facility] OR;  Service: Neurosurgery;  Laterality: N/A;   REFRACTIVE SURGERY     RIGHT/LEFT HEART CATH AND CORONARY ANGIOGRAPHY N/A 07/08/2022   Procedure: RIGHT/LEFT HEART CATH AND CORONARY ANGIOGRAPHY;  Surgeon: End,  Veryl Gottron, MD;  Location: [facility] INVASIVE CV LAB;  Service: Cardiovascular;  Laterality: N/A;    Home Medications:  Allergies as of 08/23/2023       Reactions   Beta Adrenergic Blockers    Chest pressure/difficulty breathing   Corlanor [ivabradine] Other (See Comments)   Saw flashing lights   Jardiance  [empagliflozin ]    Muscle cramps   Levaquin  [levofloxacin  In D5w]    Low blood sugar; does take this when she needs it. She just makes sure to control her BS to avoid hypoglycemia.   Lisinopril  Cough   Zetia  [ezetimibe ]    Lethargic    Prevnar [pneumococcal 13-val Conj Vacc] Rash   Joint Pain   Sulfa Antibiotics Rash   "makes my skin raw"        Medication List        Accurate as of August 23, 2023  2:08 PM. If you have any questions, ask your nurse or doctor.          Accu-Chek FastClix Lancet Kit 1 each by Does not apply route as directed.   Accu-Chek FastClix Lancets Misc TEST FASTING BLOOD SUGAR EVERY DAY   Accu-Chek Guide test strip Generic drug: glucose  blood TEST FASTING BLOOD SUGAR EVERY DAY AS DIRECTED   Accu-Chek Guide w/Device Kit USE AS DIRECTED TO CHECK FASTING BLOOD SUGAR DAILY   clopidogrel  75 MG tablet Commonly known as: PLAVIX  Take 1 tablet (75 mg total) by mouth daily.   cyanocobalamin  1000 MCG tablet Commonly known as: VITAMIN B12 Take 1,000 mcg by mouth daily.   cyclobenzaprine  5 MG tablet Commonly known as: FLEXERIL    diltiazem  180 MG 24 hr capsule Commonly known as: CARDIZEM  CD Take 180 mg by mouth in the morning and at bedtime.   estradiol  0.1 MG/GM vaginal cream Commonly known as: ESTRACE  Estrogen Cream Instruction Discard applicator Apply pea sized amount to tip of finger to urethra before bed. Wash hands well after application. Use Monday, Wednesday and Friday   isosorbide mononitrate 60 MG 24 hr tablet Commonly known as: IMDUR Take 60 mg by mouth.   latanoprost  0.005 % ophthalmic solution Commonly known as: XALATAN  Place 1 drop into both eyes at bedtime.   losartan  25 MG tablet Commonly known as: COZAAR  Take 1 tablet (25 mg total) by mouth daily.   Magnesium  250 MG Tabs Take 250 mg by mouth 3 (three) times daily.   metFORMIN  500 MG tablet Commonly known as: GLUCOPHAGE  Take 1 tablet (500 mg total) by mouth 2 (two) times daily with a meal. What changed: additional instructions   nitroGLYCERIN  0.4 MG SL tablet Commonly known as: Nitrostat  Place 1 tablet (0.4 mg total) under the tongue every 5 (five) minutes as needed for chest pain.   potassium chloride  SA 20 MEQ tablet Commonly known as: Klor-Con  M20 Take 1.5 tablets (30 mEq total) by mouth daily.   pravastatin 10 MG tablet Commonly known as: PRAVACHOL Take 10 mg by mouth daily.   torsemide  20 MG tablet Commonly known as: DEMADEX  Take 1.5 tablets (30 mg total) by mouth daily. What changed: additional instructions        Allergies:  Allergies  Allergen Reactions   Beta Adrenergic Blockers     Chest pressure/difficulty breathing    Corlanor [Ivabradine] Other (See Comments)    Saw flashing lights   Jardiance  [Empagliflozin ]     Muscle cramps   Levaquin  [Levofloxacin  In D5w]     Low blood sugar; does take this when she needs it. She  just makes sure to control her BS to avoid hypoglycemia.   Lisinopril  Cough   Zetia  [Ezetimibe ]     Lethargic    Prevnar [Pneumococcal 13-Val Conj Vacc] Rash    Joint Pain   Sulfa Antibiotics Rash    "makes my skin raw"    Family History: Family History  Problem Relation Age of Onset   Diabetes Mother    Hypertension Mother    Anxiety disorder Mother    Heart disease Father        rheumatic heart disease   Heart attack Father    Rheum arthritis Father    Colon cancer Brother    Hypertension Maternal Grandmother    Hypertension Maternal Grandfather    Breast cancer Maternal Aunt 60   Breast cancer Cousin 77    Social History:  reports that she quit smoking about 20 years ago. Her smoking use included cigarettes. She started smoking about 35 years ago. She has a 15 pack-year smoking history. She has never used smokeless tobacco. She reports that she does not drink alcohol and does not use drugs.   Physical Exam: BP (!) 145/63   Pulse 68   Ht 5\' 3"  (1.6 m)   Wt 130 lb 12.8 oz (59.3 kg)   BMI 23.17 kg/m   Constitutional:  Alert and oriented, No acute distress. HEENT: [place] AT, moist mucus membranes.  Trachea midline, no masses. Neurologic: Grossly intact, no focal deficits, moving all 4 extremities. Psychiatric: Normal mood and affect.   Assessment & Plan:    1. Recurrent urinary tract infections (UTIs) - questionable diagnosis - Review of records reveals only one documented UTI in the past year (July 2024, low colony count E. coli), with other urine cultures either negative or showing likely contaminants (e.g., group B strep, vaginal flora).  - Current urinalysis is negative.  - She reports non-specific symptoms (fatigue, malaise, occasional fever) but lacks classic  UTI symptoms (dysuria, frequency, urgency). Home urine dipsticks are unreliable due to likely contamination from cystocele. - Differential includes: true recurrent UTI (less likely given objective data), symptoms related to pelvic organ prolapse/cystocele, atrophic vaginitis, anemia, or other systemic causes. - Educated her on the low likelihood of true recurrent UTIs based on objective data.  - Advised to avoid empiric antibiotics unless infection is confirmed. Instructed to contact office (via phone or MyChart) for prompt evaluation and urinalysis if symptoms recur, to avoid unnecessary walk-in clinic visits and over-treatment.  - Will monitor for future episodes and reassess as needed.  - Recurrent UTI Prevention Strategies  Stay well hydrated. Get a moderate amount of exercise. Eat a diet rich in fruit and vegetables. Start a bowel regimen to manage your constipation. Your goal is to have consistent, formed bowel movements that are easy for you to pass. You may use either of the over-the-counter supplements Benefiber or Miralax  to help with this. I recommend that you try Benefiber first and move on to Miralax  if this is not helping you enough. You may adjust the recommended dose of Miralax  (one capful daily) to achieve this goal. Start taking an over-the-counter cranberry supplement for urinary tract health. Take this once or twice daily on an empty stomach, e.g. right before bed. Start taking an over-the-counter d-mannose supplement. Take this daily per packaging instructions. Start taking an over-the-counter probiotic containing the bacterial species called Lactobacillus. Take this daily. Start vaginal estrogen cream. Apply a pea-sized amount around the opening of the urethra every day for 2 weeks, then three times  weekly forever.  2. Cystocele with associated vaginal bleeding and incomplete bladder emptying - Imaging (CT 2021) and exam consistent with significant cystocele. She reports  vaginal bulge, need to lean forward to void, and frequent vaginal spotting/bleeding, likely due to mucosal irritation and atrophic changes. No evidence of stones or upper tract abnormality. - Initiated topical vaginal estrogen cream (apply daily initially to affected area and periurethral region, then reduce to three times weekly as maintenance) to improve mucosal integrity, reduce bleeding, and potentially decrease UTI risk.  - Discussed pessary as a non-surgical option for prolapse management; She currently compensating well and not interested in pessary at this time, but referral to gynecology can be made if symptoms worsen or if she desires trial of pessary.  - Reinforced importance of pelvic floor exercises and complete bladder emptying techniques.  3. Cardiac history (remote MI, diuretic use) - No acute cardiac symptoms reported.  - Advised to continue current cardiac medications and follow up with cardiology as scheduled.  - No changes to diuretic regimen at this time.  Return if symptoms worsen or fail to improve.  I have reviewed the above documentation for accuracy and completeness, and I agree with the above.   Dustin Gimenez, MD   [facility] [address] [place], [place] [address] [phone]

## 2023-08-23 NOTE — Patient Instructions (Signed)
For UTI prevention take cranberry tablets, probiotic, D-Mannose daily.  

## 2023-08-30 ENCOUNTER — Ambulatory Visit (INDEPENDENT_AMBULATORY_CARE_PROVIDER_SITE_OTHER): Payer: Medicare HMO

## 2023-08-30 VITALS — Ht 63.0 in | Wt 128.6 lb

## 2023-08-30 DIAGNOSIS — Z0000 Encounter for general adult medical examination without abnormal findings: Secondary | ICD-10-CM

## 2023-08-30 NOTE — Progress Notes (Signed)
 Because this visit was a virtual/telehealth visit,  certain criteria was not obtained, such a blood pressure, CBG if applicable, and timed get up and go. Any medications not marked as "taking" were not mentioned during the medication reconciliation part of the visit. Any vitals not documented were not able to be obtained due to this being a telehealth visit or patient was unable to self-report a recent blood pressure reading due to a lack of equipment at home via telehealth. Vitals that have been documented are verbally provided by the patient.   Subjective:   Jessica Wheeler is a 83 y.o. who presents for a Medicare Wellness preventive visit.  Visit Complete: Virtual I connected with  Jessica Wheeler on 08/30/23 by a audio enabled telemedicine application and verified that I am speaking with the correct person using two identifiers.  Patient Location: Home  Provider Location: Office/Clinic  I discussed the limitations of evaluation and management by telemedicine. The patient expressed understanding and agreed to proceed.  Vital Signs: Because this visit was a virtual/telehealth visit, some criteria may be missing or patient reported. Any vitals not documented were not able to be obtained and vitals that have been documented are patient reported.  VideoDeclined- This patient declined Librarian, academic. Therefore the visit was completed with audio only.  Persons Participating in Visit: Patient.  AWV Questionnaire: No: Patient Medicare AWV questionnaire was not completed prior to this visit.  Cardiac Risk Factors include: advanced age (>18men, >33 women);diabetes mellitus;family history of premature cardiovascular disease;hypertension;dyslipidemia (walks avd 10-12  miles per week)     Objective:    Today's Vitals   08/30/23 0841  Weight: 128 lb 9.6 oz (58.3 kg)  Height: 5\' 3"  (1.6 m)  PainSc: 0-No pain   Body mass index is 22.78 kg/m.     08/30/2023     8:46 AM 10/04/2022    4:53 AM 07/29/2022   12:17 PM 07/19/2022   10:20 AM 07/18/2022   10:24 AM 07/08/2022   12:00 PM 07/07/2022    7:54 PM  Advanced Directives  Does Patient Have a Medical Advance Directive? Yes Yes Yes Yes Yes No No  Type of Advance Directive Living will  Living will Living will Healthcare Power of [place];Living will    Does patient want to make changes to medical advance directive?   No - Patient declined No - Patient declined No - Patient declined    Would patient like information on creating a medical advance directive?   No - Patient declined No - Patient declined No - Patient declined No - Patient declined     Current Medications (verified) Outpatient Encounter Medications as of 08/30/2023  Medication Sig   Accu-Chek FastClix Lancets MISC TEST FASTING BLOOD SUGAR EVERY DAY   ACCU-CHEK GUIDE test strip TEST FASTING BLOOD SUGAR EVERY DAY AS DIRECTED   Blood Glucose Monitoring Suppl (ACCU-CHEK GUIDE) w/Device KIT USE AS DIRECTED TO CHECK FASTING BLOOD SUGAR DAILY   clopidogrel  (PLAVIX ) 75 MG tablet Take 1 tablet (75 mg total) by mouth daily.   cyclobenzaprine  (FLEXERIL ) 5 MG tablet    diltiazem  (CARDIZEM  CD) 180 MG 24 hr capsule Take 180 mg by mouth in the morning and at bedtime.   estradiol  (ESTRACE ) 0.1 MG/GM vaginal cream Estrogen Cream Instruction Discard applicator Apply pea sized amount to tip of finger to urethra before bed. Wash hands well after application. Use Monday, Wednesday and Friday   Lancets Misc. (ACCU-CHEK FASTCLIX LANCET) KIT 1 each by Does  not apply route as directed.   latanoprost  (XALATAN ) 0.005 % ophthalmic solution Place 1 drop into both eyes at bedtime.    losartan  (COZAAR ) 25 MG tablet Take 1 tablet (25 mg total) by mouth daily.   Magnesium  250 MG TABS Take 250 mg by mouth 3 (three) times daily.   metFORMIN  (GLUCOPHAGE ) 500 MG tablet Take 1 tablet (500 mg total) by mouth 2 (two) times daily with a meal. (Patient taking differently: Take 500 mg by  mouth 2 (two) times daily with a meal. Take 1000 mg 2x daily)   nitroGLYCERIN  (NITROSTAT ) 0.4 MG SL tablet Place 1 tablet (0.4 mg total) under the tongue every 5 (five) minutes as needed for chest pain.   potassium chloride  SA (KLOR-CON  M20) 20 MEQ tablet Take 1.5 tablets (30 mEq total) by mouth daily.   pravastatin (PRAVACHOL) 10 MG tablet Take 10 mg by mouth daily.   torsemide  (DEMADEX ) 20 MG tablet Take 1.5 tablets (30 mg total) by mouth daily. (Patient taking differently: Take 30 mg by mouth daily. Taking 2 tablets twice daily)   vitamin B-12 (CYANOCOBALAMIN ) 1000 MCG tablet Take 1,000 mcg by mouth daily.   [DISCONTINUED] isosorbide mononitrate (IMDUR) 60 MG 24 hr tablet Take 60 mg by mouth.   Facility-Administered Encounter Medications as of 08/30/2023  Medication   ticagrelor  (BRILINTA ) tablet    Allergies (verified) Beta adrenergic blockers, Corlanor [ivabradine], Jardiance  [empagliflozin ], Levaquin  [levofloxacin  in d5w], Lisinopril , Zetia  [ezetimibe ], Prevnar [pneumococcal 13-val conj vacc], and Sulfa antibiotics   History: Past Medical History:  Diagnosis Date   Allergy    Arthritis    Bundle branch block, right    C7 cervical fracture (HCC)    CAD (coronary artery disease)    a. 06/2005 Cath: LM nl, LAD 73m, LCX nl, RCA 20ost->med rx; b. 07/2022 NSTEMI/PCI: LM 40d, LAD 50p, 40p/m, 24m (2.0x19 Onyx Frontier DES), RI 20, LCX small, mild/diff dzs, RCA 60ost, 65p, 60d. EF 25-35%.   CHF (congestive heart failure) (HCC)    Chronic HFrEF (heart failure with reduced ejection fraction) (HCC)    a. 10/2011 Echo: EF 65-70%; b. 07/2022 Echo: EF 35-40%, glob HK, basal wall motion best preserved. GrI DD, nl RV fxn, mild MR.   Diabetes mellitus    Diffuse cystic mastopathy    Dissection, aorta (HCC)    Dr.Dew follows   Diverticulosis    Family history of malignant neoplasm of gastrointestinal tract    Glaucoma 2005   Heart murmur    History of bronchitis    History of pancreatitis 2005    Hyperlipidemia LDL goal <70    Hypertension 1982   Ischemic cardiomyopathy    a. 07/2022 Echo: EF 35-40%.   Personal history of tobacco use, presenting hazards to health    RBBB    Ulcer    Past Surgical History:  Procedure Laterality Date   ABDOMINAL HYSTERECTOMY  1991   ANTERIOR CERVICAL CORPECTOMY N/A 03/30/2020   Procedure: CORPECTOMY CERVICAL FOUR - CERVICAL FIVE;  Surgeon: Garry Kansas, MD;  Location: [facility] OR;  Service: Neurosurgery;  Laterality: N/A;  anterior   APPENDECTOMY     BACK SURGERY  1982   for lumbar ruptured disc. No hardware, no discectomy, no fusion   BLADDER SURGERY     BREAST BIOPSY Right 1980's   benign   BREAST EXCISIONAL BIOPSY Right 1980s   benign   BREAST SURGERY     biopsy   CARDIAC CATHETERIZATION     Dr. Cara Chancellor, few years ago  CATARACT EXTRACTION W/PHACO Left 08/31/2015   Procedure: CATARACT EXTRACTION PHACO AND INTRAOCULAR LENS PLACEMENT (IOC);  Surgeon: Billee Buddle, MD;  Location: [facility];  Service: Ophthalmology;  Laterality: Left;  DIABETIC - oral meds   CATARACT EXTRACTION W/PHACO Right 10/12/2015   Procedure: CATARACT EXTRACTION PHACO AND INTRAOCULAR LENS PLACEMENT (IOC) right eye;  Surgeon: Billee Buddle, MD;  Location: [facility];  Service: Ophthalmology;  Laterality: Right;  DIABETIC - oral meds   CHOLECYSTECTOMY  1994   COLONOSCOPY  2008,02/22/2012   Dr. Sankar-2013   COLONOSCOPY WITH PROPOFOL  N/A 04/04/2017   Procedure: COLONOSCOPY WITH PROPOFOL ;  Surgeon: Jerlean Mood, MD;  Location: [facility] ENDOSCOPY;  Service: Endoscopy;  Laterality: N/A;   CORONARY STENT INTERVENTION N/A 07/08/2022   Procedure: CORONARY STENT INTERVENTION;  Surgeon: Sammy Crisp, MD;  Location: [facility] INVASIVE CV LAB;  Service: Cardiovascular;  Laterality: N/A;   POSTERIOR CERVICAL FUSION/FORAMINOTOMY N/A 04/01/2020   Procedure: POSTERIOR CERVICAL FUSION/FORAMINOTOMY CERVICAL FOUR- CERVICAL SIX;  Surgeon: Garry Kansas,  MD;  Location: [facility] OR;  Service: Neurosurgery;  Laterality: N/A;   REFRACTIVE SURGERY     RIGHT/LEFT HEART CATH AND CORONARY ANGIOGRAPHY N/A 07/08/2022   Procedure: RIGHT/LEFT HEART CATH AND CORONARY ANGIOGRAPHY;  Surgeon: Sammy Crisp, MD;  Location: [facility] INVASIVE CV LAB;  Service: Cardiovascular;  Laterality: N/A;   Family History  Problem Relation Age of Onset   Diabetes Mother    Hypertension Mother    Anxiety disorder Mother    Heart disease Father        rheumatic heart disease   Heart attack Father    Rheum arthritis Father    Colon cancer Brother    Hypertension Maternal Grandmother    Hypertension Maternal Grandfather    Breast cancer Maternal Aunt 39   Breast cancer Cousin 3   Social History   Socioeconomic History   Marital status: Widowed    Spouse name: Not on file   Number of children: 2   Years of education: 14   Highest education level: Associate degree: occupational, Scientist, product/process development, or vocational program  Occupational History   Occupation: Retired  Tobacco Use   Smoking status: Former    Current packs/day: 0.00    Average packs/day: 1 pack/day for 15.0 years (15.0 ttl pk-yrs)    Types: Cigarettes    Start date: 08/02/1988    Quit date: 08/03/2003    Years since quitting: 20.0   Smokeless tobacco: Never  Vaping Use   Vaping status: Never Used  Substance and Sexual Activity   Alcohol use: No   Drug use: No   Sexual activity: Not on file  Other Topics Concern   Not on file  Social History Narrative   Not on file   Social Drivers of Health   Financial Resource Strain: Low Risk  (08/30/2023)   Overall Financial Resource Strain (CARDIA)    Difficulty of Paying Living Expenses: Not very hard  Recent Concern: Financial Resource Strain - Medium Risk (08/10/2023)   Overall Financial Resource Strain (CARDIA)    Difficulty of Paying Living Expenses: Somewhat hard  Food Insecurity: No Food Insecurity (08/30/2023)   Hunger Vital Sign    Worried About Running Out  of Food in the Last Year: Never true    Ran Out of Food in the Last Year: Never true  Transportation Needs: No Transportation Needs (08/30/2023)   PRAPARE - Administrator, Civil Service (Medical): No    Lack of Transportation (Non-Medical): No  Physical  Activity: Sufficiently Active (08/30/2023)   Exercise Vital Sign    Days of Exercise per Week: 5 days    Minutes of Exercise per Session: 50 min  Stress: No Stress Concern Present (08/30/2023)   [employer] of Occupational Health - Occupational Stress Questionnaire    Feeling of Stress : Not at all  Social Connections: Socially Isolated (08/30/2023)   Social Connection and Isolation Panel [NHANES]    Frequency of Communication with Friends and Family: More than three times a week    Frequency of Social Gatherings with Friends and Family: Once a week    Attends Religious Services: Never    Database administrator or Organizations: No    Attends Banker Meetings: Never    Marital Status: Widowed    Tobacco Counseling Counseling given: Not Answered    Clinical Intake:  Pre-visit preparation completed: Yes  Pain : No/denies pain Pain Score: 0-No pain     BMI - recorded: 22.78 Nutritional Status: BMI of 19-24  Normal Nutritional Risks: None Diabetes: Yes CBG done?: No Did pt. bring in CBG monitor from home?: No  Lab Results  Component Value Date   HGBA1C 6.8 (H) 08/14/2023   HGBA1C 7.1 (H) 01/31/2023   HGBA1C 7.2 (A) 10/31/2022     How often do you need to have someone help you when you read instructions, pamphlets, or other written materials from your doctor or pharmacy?: 1 - Never  Interpreter Needed?: No  Information entered by :: Guilianna Mckoy N. Evalette Montrose, LPN.   Activities of Daily Living     08/30/2023    8:46 AM  In your present state of health, do you have any difficulty performing the following activities:  Hearing? 0  Vision? 0  Difficulty concentrating or making decisions? 0   Walking or climbing stairs? 0  Dressing or bathing? 0  Doing errands, shopping? 0  Preparing Food and eating ? N  Using the Toilet? N  In the past six months, have you accidently leaked urine? N  Do you have problems with loss of bowel control? N  Managing your Medications? N  Managing your Finances? N  Housekeeping or managing your Housekeeping? N    Patient Care Team: Mazie Speed, MD as PCP - General (Family Medicine) Jerelene Monday Deadra Everts, MD as PCP - Cardiology (Cardiology) Devorah Fonder, MD as Consulting Physician (Cardiology) Velma Ghazi, DPM as Consulting Physician (Podiatry) Reche Canales, [place] (Optometry) Orvan Blanch, MD as Consulting Physician (Orthopedic Surgery) Garry Kansas, MD as Consulting Physician (Neurosurgery) Von Grumbling, MD as Referring Physician (Otolaryngology) Reche Canales, OD as Consulting Physician (Optometry)  Indicate any recent Medical Services you may have received from other than [facility] providers in the past year (date may be approximate).     Assessment:   This is a routine wellness examination for [place].  Hearing/Vision screen Hearing Screening - Comments:: Denies hearing difficulties.   Vision Screening - Comments:: Wears rx glasses - up to date with routine eye exams with Dr. Rosan Comfort at [facility]    Goals Addressed             This Visit's Progress    Patient Stated       08/30/2023: My healthcare goals is to stay active, independent and eat healthy.       Depression Screen     08/30/2023    8:49 AM 01/31/2023    8:36 AM 11/09/2022   11:02 AM 10/31/2022  8:12 AM 10/21/2022   10:38 AM 09/19/2022    8:19 AM 08/09/2022    8:55 AM  PHQ 2/9 Scores  PHQ - 2 Score 0 0 1 2 1 3  0  PHQ- 9 Score 0 5 4 6 5 13 6     Fall Risk     08/30/2023    8:47 AM 10/21/2022   10:36 AM 08/09/2022    8:55 AM 08/02/2022    2:51 PM 07/18/2022   10:22 AM  Fall Risk   Falls in the past year? 0 0 0 0 0  Number falls in past yr: 0 0 0  0 0  Injury with Fall? 0 0 0 0 0  Risk for fall due to : No Fall Risks No Fall Risks No Fall Risks History of fall(s) No Fall Risks  Follow up Falls prevention discussed;Falls evaluation completed Falls evaluation completed Falls evaluation completed Falls evaluation completed Falls evaluation completed;Education provided;Falls prevention discussed    MEDICARE RISK AT HOME:  Medicare Risk at Home Any stairs in or around the home?: Yes If so, are there any without handrails?: No Home free of loose throw rugs in walkways, pet beds, electrical cords, etc?: Yes Adequate lighting in your home to reduce risk of falls?: Yes Life alert?: No Use of a cane, walker or w/c?: No Grab bars in the bathroom?: Yes Shower chair or bench in shower?: No Elevated toilet seat or a handicapped toilet?: No  TIMED UP AND GO:  Was the test performed?  No  Cognitive Function: 6CIT completed    08/30/2023    8:49 AM  MMSE - Mini Mental State Exam  Not completed: Unable to complete        08/30/2023    8:56 AM 07/04/2019   10:14 AM  6CIT Screen  What Year? 0 points 0 points  What month? 0 points 0 points  What time? 0 points 0 points  Count back from 20 0 points 0 points  Months in reverse 0 points 0 points  Repeat phrase 0 points 4 points  Total Score 0 points 4 points    Immunizations Immunization History  Administered Date(s) Administered   Fluad Quad(high Dose 65+) 02/26/2019, 01/17/2020, 01/29/2021, 01/20/2022   Fluad Trivalent(High Dose 65+) 01/31/2023   Influenza, High Dose Seasonal PF 02/03/2017, 01/20/2018   Influenza-Unspecified 01/28/2016, 01/30/2017   Moderna Sars-Covid-2 Vaccination 05/15/2019, 06/12/2019, 03/17/2020   Pneumococcal Conjugate-13 09/10/2013, 02/26/2014   Pneumococcal Polysaccharide-23 08/23/2010   Zoster Recombinant(Shingrix) 05/17/2022   Zoster, Live 06/14/2011    Screening Tests Health Maintenance  Topic Date Due   DTaP/Tdap/Td (1 - Tdap) Never done   Zoster  Vaccines- Shingrix (2 of 2) 07/12/2022   COVID-19 Vaccine (4 - 2024-25 season) 01/01/2023   INFLUENZA VACCINE  12/01/2023   HEMOGLOBIN A1C  02/13/2024   OPHTHALMOLOGY EXAM  06/19/2024   Diabetic kidney evaluation - eGFR measurement  08/13/2024   Diabetic kidney evaluation - Urine ACR  08/13/2024   FOOT EXAM  08/13/2024   Medicare Annual Wellness (AWV)  08/29/2024   Pneumonia Vaccine 58+ Years old  Completed   DEXA SCAN  Completed   HPV VACCINES  Aged Out   Meningococcal B Vaccine  Aged Out    Health Maintenance  Health Maintenance Due  Topic Date Due   DTaP/Tdap/Td (1 - Tdap) Never done   Zoster Vaccines- Shingrix (2 of 2) 07/12/2022   COVID-19 Vaccine (4 - 2024-25 season) 01/01/2023   Health Maintenance Items Addressed: Yes Patient is aware  of current care gaps.  Additional Screening:  Vision Screening: Recommended annual ophthalmology exams for early detection of glaucoma and other disorders of the eye.  Dental Screening: Recommended annual dental exams for proper oral hygiene  Community Resource Referral / Chronic Care Management: CRR required this visit?  No   CCM required this visit?  No     Plan:     I have personally reviewed and noted the following in the patient's chart:   Medical and social history Use of alcohol, tobacco or illicit drugs  Current medications and supplements including opioid prescriptions. Patient is not currently taking opioid prescriptions. Functional ability and status Nutritional status Physical activity Advanced directives List of other physicians Hospitalizations, surgeries, and ER visits in previous 12 months Vitals Screenings to include cognitive, depression, and falls Referrals and appointments  In addition, I have reviewed and discussed with patient certain preventive protocols, quality metrics, and best practice recommendations. A written personalized care plan for preventive services as well as general preventive health  recommendations were provided to patient.     Margette Sheldon, LPN   08/08/8117   After Visit Summary: (MyChart) Due to this being a telephonic visit, the after visit summary with patients personalized plan was offered to patient via MyChart   Notes: Nothing significant to report at this time.

## 2023-08-30 NOTE — Patient Instructions (Addendum)
 [name] , Thank you for taking time to come for your Medicare Wellness Visit. I appreciate your ongoing commitment to your health goals. Please review the following plan we discussed and let me know if I can assist you in the future.   Referrals/Orders/Follow-Ups/Clinician Recommendations: Keep maintaining your health by keeping your appointments with Dr. Bacigalupo and any specialists that you may see.  Call us  if you need anything.  Have a great year!!!!  Recommendations from Nurse Iyari Hagner: Aim for 30 minutes of exercise or brisk walking 5 days per week.  Drink 6-8 glasses of water each day. Eat 5-6 servings of fresh vegetables and fruits per day. Continue to do brain exercises such as reading, puzzles, games on your phone to help keep the brain sharp and active.  This is a list of the screening recommended for you and due dates:  Health Maintenance  Topic Date Due   DTaP/Tdap/Td vaccine (1 - Tdap) Never done   Zoster (Shingles) Vaccine (2 of 2) 07/12/2022   COVID-19 Vaccine (4 - 2024-25 season) 01/01/2023   Medicare Annual Wellness Visit  08/09/2023   Flu Shot  12/01/2023   Hemoglobin A1C  02/13/2024   Eye exam for diabetics  06/19/2024   Yearly kidney function blood test for diabetes  08/13/2024   Yearly kidney health urinalysis for diabetes  08/13/2024   Complete foot exam   08/13/2024   Pneumonia Vaccine  Completed   DEXA scan (bone density measurement)  Completed   HPV Vaccine  Aged Out   Meningitis B Vaccine  Aged Out    Advanced directives: (Copy Requested) Please bring a copy of your health care power of attorney and living will to the office to be added to your chart at your convenience. You can mail to [facility] [address] [place], [place] [address] or email to [email or web address]  Next Medicare Annual Wellness Visit scheduled for next year: Yes, It was nice speaking with you today! Your next Annual Wellness Visit is scheduled for 09/04/2024 at  8:50 a.m. via PHONE VISIT. If you need to reschedule or cancel, please call [phone].   *Preventive Care attachment *FALL PREVENTION attachment if needed

## 2023-09-01 DIAGNOSIS — R0609 Other forms of dyspnea: Secondary | ICD-10-CM | POA: Diagnosis not present

## 2023-09-01 DIAGNOSIS — J301 Allergic rhinitis due to pollen: Secondary | ICD-10-CM | POA: Diagnosis not present

## 2023-09-01 DIAGNOSIS — Z87891 Personal history of nicotine dependence: Secondary | ICD-10-CM | POA: Diagnosis not present

## 2023-09-05 DIAGNOSIS — G5601 Carpal tunnel syndrome, right upper limb: Secondary | ICD-10-CM | POA: Diagnosis not present

## 2023-09-07 ENCOUNTER — Ambulatory Visit: Attending: Cardiovascular Disease | Admitting: Cardiovascular Disease

## 2023-09-07 ENCOUNTER — Encounter: Payer: Self-pay | Admitting: Cardiovascular Disease

## 2023-09-07 VITALS — BP 127/58 | HR 65 | Ht 63.0 in | Wt 131.6 lb

## 2023-09-07 DIAGNOSIS — I10 Essential (primary) hypertension: Secondary | ICD-10-CM | POA: Diagnosis not present

## 2023-09-07 DIAGNOSIS — I2510 Atherosclerotic heart disease of native coronary artery without angina pectoris: Secondary | ICD-10-CM

## 2023-09-07 DIAGNOSIS — E785 Hyperlipidemia, unspecified: Secondary | ICD-10-CM

## 2023-09-07 DIAGNOSIS — I5022 Chronic systolic (congestive) heart failure: Secondary | ICD-10-CM | POA: Diagnosis not present

## 2023-09-07 NOTE — Progress Notes (Signed)
 Cardiology Office Note   Date:  09/07/2023   ID:  Jessica Wheeler, DOB August 19, 1940, MRN [id number]  PCP:  Mazie Speed, MD  Cardiologist:   Antionette Kirks, MD   No chief complaint on file.     History of Present Illness: Jessica Wheeler is a 83 y.o. female who presents to establish cardiovascular care with me.  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  Her ejection fraction was 35 to 40% in March 2024.  She had a stress MRI done in August 2024 which showed no evidence of ischemia with normal RV function and low normal LV systolic function with an EF of 54% with apical akinesis. She was hospitalized for blood loss anemia 2 due to GI bleed while she was on dual antiplatelet therapy last year with aspirin  and ticagrelor .  She was subsequently switched from ticagrelor  to clopidogrel  with no issues. She was disappointed because she called our office complaining of dark stools and shortness of breath and she felt that she was not taken seriously.  She switched to Dr. Beau Bound and was very pleased but her insurance does not cover [facility] services and that she is switching to us . She is very sensitive to medications and allergic to multiple medications including beta-blockers and ivabradine.  She also did not tolerate SGLT2 inhibitors due to myalgia.  She has statin myalgia and is able to take pravastatin only. She has been doing reasonably well and denies chest pain or worsening dyspnea.  Her myocardial infarction symptoms were very atypical overall with fatigue and shortness of breath but no chest pain.    Past Medical History:  Diagnosis Date   Allergy    Arthritis    Bundle branch block, right    C7 cervical fracture (HCC)    CAD (coronary artery  disease)    a. 06/2005 Cath: LM nl, LAD 53m, LCX nl, RCA 20ost->med rx; b. 07/2022 NSTEMI/PCI: LM 40d, LAD 50p, 40p/m, 81m (2.0x19 Onyx Frontier DES), RI 20, LCX small, mild/diff dzs, RCA 60ost, 65p, 60d. EF 25-35%.   CHF (congestive heart failure) (HCC)    Chronic HFrEF (heart failure with reduced ejection fraction) (HCC)    a. 10/2011 Echo: EF 65-70%; b. 07/2022 Echo: EF 35-40%, glob HK, basal wall motion best preserved. GrI DD, nl RV fxn, mild MR.   Diabetes mellitus    Diffuse cystic mastopathy    Dissection, aorta (HCC)    Dr.Dew follows   Diverticulosis    Family history of malignant neoplasm of gastrointestinal tract    Glaucoma 2005   Heart murmur    History of bronchitis    History of pancreatitis 2005   Hyperlipidemia LDL goal <70    Hypertension 1982   Ischemic cardiomyopathy    a. 07/2022 Echo: EF 35-40%.   Personal history of tobacco use, presenting hazards to health    RBBB    Ulcer     Past Surgical History:  Procedure Laterality Date   ABDOMINAL HYSTERECTOMY  1991   ANTERIOR CERVICAL CORPECTOMY N/A 03/30/2020   Procedure: CORPECTOMY CERVICAL FOUR - CERVICAL FIVE;  Surgeon: Garry Kansas, MD;  Location: [facility] OR;  Service: Neurosurgery;  Laterality: N/A;  anterior   APPENDECTOMY     BACK SURGERY  1982   for lumbar ruptured disc. No hardware, no  discectomy, no fusion   BLADDER SURGERY     BREAST BIOPSY Right 1980's   benign   BREAST EXCISIONAL BIOPSY Right 1980s   benign   BREAST SURGERY     biopsy   CARDIAC CATHETERIZATION     Dr. Cara Chancellor, few years ago   CATARACT EXTRACTION W/PHACO Left 08/31/2015   Procedure: CATARACT EXTRACTION PHACO AND INTRAOCULAR LENS PLACEMENT (IOC);  Surgeon: Billee Buddle, MD;  Location: [facility];  Service: Ophthalmology;  Laterality: Left;  DIABETIC - oral meds   CATARACT EXTRACTION W/PHACO Right 10/12/2015   Procedure: CATARACT EXTRACTION PHACO AND INTRAOCULAR LENS PLACEMENT (IOC) right eye;  Surgeon: Billee Buddle, MD;  Location: [facility];  Service: Ophthalmology;  Laterality: Right;  DIABETIC - oral meds   CHOLECYSTECTOMY  1994   COLONOSCOPY  2008,02/22/2012   Dr. Sankar-2013   COLONOSCOPY WITH PROPOFOL  N/A 04/04/2017   Procedure: COLONOSCOPY WITH PROPOFOL ;  Surgeon: Jerlean Mood, MD;  Location: [facility] END[facility]COPY;  Service: Endoscopy;  Laterality: N/A;   CORONARY STENT INTERVENTION N/A 07/08/2022   Procedure: CORONARY STENT INTERVENTION;  Surgeon: Sammy Crisp, MD;  Location: [facility] INVASIVE CV LAB;  Service: Cardiovascular;  Laterality: N/A;   POSTERIOR CERVICAL FUSION/FORAMINOTOMY N/A 04/01/2020   Procedure: POSTERIOR CERVICAL FUSION/FORAMINOTOMY CERVICAL FOUR- CERVICAL SIX;  Surgeon: Garry Kansas, MD;  Location[facility] OR;  Service: Neurosurgery;  Laterality: N/A;   REFRACTIVE SURGERY     RIGHT/LEFT HEART CATH AND CORONARY ANGIOGRAPHY N/A 07/08/2022   Procedure: RIGHT/LEFT HEART CATH AND CORONARY ANGIOGRAPHY;  Surgeon: Sammy Crisp, MD;  Location: [facility] INVASIVE CV LAB;  Service: Cardiovascular;  Laterality: N/A;     Current Outpatient Medications  Medication Sig Dispense Refill   Accu-Chek FastClix Lancets MISC TEST FASTING BLOOD SUGAR EVERY DAY 102 each 3   ACCU-CHEK GUIDE test strip TEST FASTING BLOOD SUGAR EVERY DAY AS DIRECTED 100 strip 3   Blood Glucose Monitoring Suppl (ACCU-CHEK GUIDE) w/Device KIT USE AS DIRECTED TO CHECK FASTING BLOOD SUGAR DAILY 1 kit 0   clopidogrel  (PLAVIX ) 75 MG tablet Take 1 tablet (75 mg total) by mouth daily. 90 tablet 1   cyclobenzaprine  (FLEXERIL ) 5 MG tablet      diltiazem  (CARDIZEM  CD) 180 MG 24 hr capsule Take 180 mg by mouth in the morning and at bedtime.     estradiol  (ESTRACE ) 0.1 MG/GM vaginal cream Estrogen Cream Instruction Discard applicator Apply pea sized amount to tip of finger to urethra before bed. Wash hands well after application. Use Monday, Wednesday and Friday 42.5 g 12   Lancets Misc. (ACCU-CHEK FASTCLIX LANCET)  KIT 1 each by Does not apply route as directed. 1 kit 1   latanoprost  (XALATAN ) 0.005 % ophthalmic solution Place 1 drop into both eyes at bedtime.   3   losartan  (COZAAR ) 25 MG tablet Take 1 tablet (25 mg total) by mouth daily. 90 tablet 3   Magnesium  250 MG TABS Take 250 mg by mouth 3 (three) times daily.     metFORMIN  (GLUCOPHAGE ) 500 MG tablet Take 1 tablet (500 mg total) by mouth 2 (two) times daily with a meal. (Patient taking differently: Take 500 mg by mouth 2 (two) times daily with a meal. Take 1000 mg 2x daily) 180 tablet 3   nitroGLYCERIN  (NITROSTAT ) 0.4 MG SL tablet Place 1 tablet (0.4 mg total) under the tongue every 5 (five) minutes as needed for chest pain. 100 tablet 3   potassium chloride  SA (KLOR-CON  M20) 20 MEQ tablet Take 1.5 tablets (30  mEq total) by mouth daily. 135 tablet 3   pravastatin (PRAVACHOL) 10 MG tablet Take 10 mg by mouth daily.     torsemide  (DEMADEX ) 20 MG tablet Take 1.5 tablets (30 mg total) by mouth daily. (Patient taking differently: Take 30 mg by mouth daily. Taking 2 tablets twice daily) 135 tablet 1   vitamin B-12 (CYANOCOBALAMIN ) 1000 MCG tablet Take 1,000 mcg by mouth daily.     No current facility-administered medications for this visit.   Facility-Administered Medications Ordered in Other Visits  Medication Dose Route Frequency Provider Last Rate Last Admin   ticagrelor  (BRILINTA ) tablet    PRN End, Veryl Gottron, MD   180 mg at 07/08/22 1605    Allergies:   Beta adrenergic blockers, Corlanor [ivabradine], Jardiance  [empagliflozin ], Levaquin  [levofloxacin  in d5w], Lisinopril , Zetia  [ezetimibe ], Prevnar [pneumococcal 13-val conj vacc], and Sulfa antibiotics    Social History:  The patient  reports that she quit smoking about 20 years ago. Her smoking use included cigarettes. She started smoking about 35 years ago. She has a 15 pack-year smoking history. She has never used smokeless tobacco. She reports that she does not drink alcohol and does not use  drugs.   Family History:  The patient's family history includes Anxiety disorder in her mother; Breast cancer (age of onset: 80) in her cousin; Breast cancer (age of onset: 66) in her maternal aunt; Colon cancer in her brother; Diabetes in her mother; Heart attack in her father; Heart disease in her father; Hypertension in her maternal grandfather, maternal grandmother, and mother; Rheum arthritis in her father.    ROS:  Please see the history of present illness.   Otherwise, review of systems are positive for none.   All other systems are reviewed and negative.    PHYSICAL EXAM: VS:  BP (!) 127/58 (BP Location: Left Arm, Patient Position: Sitting, Cuff Size: Normal)   Pulse 65   Ht 5\' 3"  (1.6 m)   Wt 131 lb 9.6 oz (59.7 kg)   SpO2 97%   BMI 23.31 kg/m  , BMI Body mass index is 23.31 kg/m. GEN: Well nourished, well developed, in no acute distress  HEENT: normal  Neck: no JVD, carotid bruits, or masses Cardiac: RRR; no rubs, or gallops,no edema .  2 out of 6 systolic murmur in the aortic area which radiates to the carotid arteries Respiratory:  clear to auscultation bilaterally, normal work of breathing GI: soft, nontender, nondistended, + BS MS: no deformity or atrophy  Skin: warm and dry, no rash Neuro:  Strength and sensation are intact Psych: euthymic mood, full affect   EKG:  EKG is ordered today. The ekg ordered today demonstrates : Normal sinus rhythm Right bundle branch block Possible Anterior infarct , age undetermined When compared with ECG of 29-Dec-2022 09:17, Right bundle branch block has replaced Non-specific intra-ventricular conduction block Borderline criteria for Anterior infarct are now Present    Recent Labs: 01/31/2023: ALT 23; Hemoglobin 11.0; Platelets 294 03/08/2023: Magnesium  2.1 07/13/2023: B Natriuretic Peptide 120.6 08/14/2023: BUN 21; Creatinine, Ser 0.86; Potassium 4.1; Sodium 138    Lipid Panel    Component Value Date/Time   CHOL 197  07/08/2022 0536   CHOL 187 02/03/2021 0831   TRIG 62 07/08/2022 0536   HDL 103 07/08/2022 0536   HDL 84 02/03/2021 0831   CHOLHDL 1.9 07/08/2022 0536   VLDL 12 07/08/2022 0536   LDLCALC 82 07/08/2022 0536   LDLCALC 90 02/03/2021 0831      Wt Readings from  Last 3 Encounters:  09/07/23 131 lb 9.6 oz (59.7 kg)  08/30/23 128 lb 9.6 oz (58.3 kg)  08/23/23 130 lb 12.8 oz (59.3 kg)           No data to display            ASSESSMENT AND PLAN:  1.  Coronary artery disease involving native coronary arteries without angina: She is doing very well with no anginal symptoms.  Continue clopidogrel  monotherapy for now.  She had GI bleed and ticagrelor .  2.  Chronic systolic heart failure: Most recent ejection fraction was 54% by cardiac MRI.  She is highly intolerant to beta-blockers and did not tolerate SGLT2 inhibitors.  She feels best on diltiazem  which will be continued and she also takes losartan .  She appears to be euvolemic on current dose of torsemide  30 mg once daily.  3.  Hyperlipidemia: Statin induced myalgia.  She is able to tolerate pravastatin.  Most recent lipid profile showed an LDL of 82.  4.  Essential hypertension: Blood pressure is well-controlled on current medications    Disposition:   FU with me in 6 months  Signed,  Antionette Kirks, MD  09/07/2023 1:28 PM    [facility]

## 2023-09-07 NOTE — Patient Instructions (Signed)
 Medication Instructions:  Your Physician recommend you continue on your current medication as directed.    *If you need a refill on your cardiac medications before your next appointment, please call your pharmacy*  Lab Work: No labs ordered today  If you have labs (blood work) drawn today and your tests are completely normal, you will receive your results only by: MyChart Message (if you have MyChart) OR A paper copy in the mail If you have any lab test that is abnormal or we need to change your treatment, we will call you to review the results.  Testing/Procedures: No test ordered today   Follow-Up: At [facility], you and your health needs are our priority.  As part of our continuing mission to provide you with exceptional heart care, our providers are all part of one team.  This team includes your primary Cardiologist (physician) and Advanced Practice Providers or APPs (Physician Assistants and Nurse Practitioners) who all work together to provide you with the care you need, when you need it.  Your next appointment:   6 month(s)  Provider:   Antionette Kirks, MD

## 2023-09-15 DIAGNOSIS — H52223 Regular astigmatism, bilateral: Secondary | ICD-10-CM | POA: Diagnosis not present

## 2023-09-15 DIAGNOSIS — H43813 Vitreous degeneration, bilateral: Secondary | ICD-10-CM | POA: Diagnosis not present

## 2023-09-15 DIAGNOSIS — E119 Type 2 diabetes mellitus without complications: Secondary | ICD-10-CM | POA: Diagnosis not present

## 2023-09-15 DIAGNOSIS — H524 Presbyopia: Secondary | ICD-10-CM | POA: Diagnosis not present

## 2023-09-15 DIAGNOSIS — H401131 Primary open-angle glaucoma, bilateral, mild stage: Secondary | ICD-10-CM | POA: Diagnosis not present

## 2023-09-15 DIAGNOSIS — Z961 Presence of intraocular lens: Secondary | ICD-10-CM | POA: Diagnosis not present

## 2023-09-15 DIAGNOSIS — H353132 Nonexudative age-related macular degeneration, bilateral, intermediate dry stage: Secondary | ICD-10-CM | POA: Diagnosis not present

## 2023-09-15 DIAGNOSIS — Z7984 Long term (current) use of oral hypoglycemic drugs: Secondary | ICD-10-CM | POA: Diagnosis not present

## 2023-09-15 DIAGNOSIS — H5203 Hypermetropia, bilateral: Secondary | ICD-10-CM | POA: Diagnosis not present

## 2023-09-26 DIAGNOSIS — R42 Dizziness and giddiness: Secondary | ICD-10-CM | POA: Diagnosis not present

## 2023-09-26 DIAGNOSIS — H903 Sensorineural hearing loss, bilateral: Secondary | ICD-10-CM | POA: Diagnosis not present

## 2023-10-27 ENCOUNTER — Other Ambulatory Visit: Payer: Self-pay

## 2023-10-27 ENCOUNTER — Telehealth: Payer: Self-pay

## 2023-10-27 MED ORDER — ACCU-CHEK GUIDE TEST VI STRP: ORAL_STRIP | 12 refills | Status: DC

## 2023-10-27 NOTE — Telephone Encounter (Signed)
 Rx and notes printed and sent for Dr B signature    Copied from CRM [id number]. Topic: Clinical - Medication Question >> Oct 27, 2023  8:19 AM Powell HERO wrote: Reason for CRM: [employer] requesting an prescription for ACCU-CHEK GUIDE test strip and office notes for this patient to see if she can get them covered. They would need the prescription and office notes by the end of the day today. [facility] [phone] Fax. >> Oct 27, 2023  1:59 PM MA Buel D wrote: . >> Oct 27, 2023 10:49 AM Powell HERO wrote: Needs to go to [facility] as they are in network with [employer]. Please call amy back to her number when available. >> Oct 27, 2023  8:25 AM Powell HERO wrote: Fax was sent this week to the office to request this but no response.

## 2023-10-30 NOTE — Telephone Encounter (Signed)
 Signed. Will leave at front desk for it to be faxed.

## 2023-10-30 NOTE — Telephone Encounter (Signed)
 Faxed prescription of office notes to [employer] 10/30/23 @ 11:20am

## 2023-11-09 ENCOUNTER — Telehealth: Payer: Self-pay

## 2023-11-09 DIAGNOSIS — E1169 Type 2 diabetes mellitus with other specified complication: Secondary | ICD-10-CM

## 2023-11-09 MED ORDER — ACCU-CHEK GUIDE W/DEVICE KIT
PACK | 0 refills | Status: DC
Start: 2023-11-09 — End: 2023-11-23

## 2023-11-09 MED ORDER — ACCU-CHEK FASTCLIX LANCETS MISC
3 refills | Status: DC
Start: 2023-11-09 — End: 2023-11-23

## 2023-11-09 MED ORDER — ACCU-CHEK FASTCLIX LANCET KIT
1.0000 | PACK | 1 refills | Status: AC
Start: 2023-11-09 — End: ?

## 2023-11-09 MED ORDER — ACCU-CHEK GUIDE TEST VI STRP: ORAL_STRIP | 12 refills | Status: DC

## 2023-11-09 NOTE — Telephone Encounter (Signed)
 Spoke with Jessica Wheeler, verfied what supplies were they needing. She confirmed needing Blood Glucose Monitoring Suppl (ACCU-CHEK GUIDE) w/Device KIT, glucose blood (ACCU-CHEK GUIDE TEST) test strip, Lancets Misc. (ACCU-CHEK FASTCLIX LANCET) KIT, Accu-Chek FastClix Lancets MISC needing to be sent in.

## 2023-11-09 NOTE — Telephone Encounter (Signed)
 Copied from CRM [id number]. Topic: General - Other >> Nov 09, 2023  9:36 AM Myrick T wrote: Reason for CRM: roselie Lias [phone] called to f/u on the script for diabetic supplies. roselie will refax request

## 2023-11-14 ENCOUNTER — Telehealth: Payer: Self-pay

## 2023-11-14 NOTE — Telephone Encounter (Signed)
 Copied from CRM [id number]. Topic: Referral - Status >> Nov 14, 2023 10:06 AM Charlet HERO wrote: Reason for CRM: [facility] calling to see if fax was rcvd for diabetic medical supplies 07/10 it has been rcvd will need to be signed and faxed back to [phone].

## 2023-11-14 NOTE — Telephone Encounter (Signed)
 Fax was received and placed in Dr. B box for completion.

## 2023-11-21 ENCOUNTER — Other Ambulatory Visit: Payer: Self-pay | Admitting: Family Medicine

## 2023-11-21 DIAGNOSIS — E1169 Type 2 diabetes mellitus with other specified complication: Secondary | ICD-10-CM | POA: Diagnosis not present

## 2023-11-21 DIAGNOSIS — E118 Type 2 diabetes mellitus with unspecified complications: Secondary | ICD-10-CM | POA: Diagnosis not present

## 2023-11-22 ENCOUNTER — Other Ambulatory Visit (HOSPITAL_COMMUNITY): Payer: Self-pay

## 2023-11-23 ENCOUNTER — Other Ambulatory Visit: Payer: Self-pay

## 2023-11-23 ENCOUNTER — Other Ambulatory Visit: Payer: Self-pay | Admitting: Cardiovascular Disease

## 2023-11-23 ENCOUNTER — Other Ambulatory Visit (HOSPITAL_COMMUNITY): Payer: Self-pay

## 2023-11-23 DIAGNOSIS — E1169 Type 2 diabetes mellitus with other specified complication: Secondary | ICD-10-CM

## 2023-11-23 MED ORDER — LATANOPROST 0.005 % OP SOLN
1.0000 [drp] | Freq: Every day | OPHTHALMIC | 1 refills | Status: DC
  Filled 2023-11-23: qty 7.5, 75d supply, fill #0

## 2023-11-23 MED ORDER — DILTIAZEM HCL ER COATED BEADS 180 MG PO CP24
180.0000 mg | ORAL_CAPSULE | Freq: Two times a day (BID) | ORAL | 3 refills | Status: DC
Start: 2023-11-23 — End: 2023-11-27
  Filled 2023-11-23: qty 90, 45d supply, fill #0

## 2023-11-23 MED ORDER — DILTIAZEM HCL ER COATED BEADS 120 MG PO CP24
120.0000 mg | ORAL_CAPSULE | Freq: Three times a day (TID) | ORAL | 4 refills | Status: DC
  Filled 2023-11-23 – 2023-11-29 (×3): qty 270, 90d supply, fill #0
  Filled 2024-02-16 – 2024-02-19 (×3): qty 270, 90d supply, fill #1
  Filled 2024-03-16 – 2024-04-30 (×2): qty 270, 90d supply, fill #2

## 2023-11-23 MED ORDER — PRAVASTATIN SODIUM 10 MG PO TABS
10.0000 mg | ORAL_TABLET | Freq: Every day | ORAL | 3 refills | Status: AC
  Filled 2023-11-23: qty 90, 90d supply, fill #0
  Filled 2024-02-16 – 2024-02-19 (×3): qty 90, 90d supply, fill #1
  Filled 2024-03-16 – 2024-04-30 (×2): qty 90, 90d supply, fill #2
  Filled 2024-05-16: qty 90, 90d supply, fill #3

## 2023-11-23 MED ORDER — POTASSIUM CHLORIDE CRYS ER 20 MEQ PO TBCR
30.0000 meq | EXTENDED_RELEASE_TABLET | Freq: Every day | ORAL | 3 refills | Status: AC
  Filled 2023-11-23: qty 135, 90d supply, fill #0
  Filled 2024-02-16 – 2024-02-19 (×3): qty 135, 90d supply, fill #1
  Filled 2024-06-02: qty 135, 90d supply, fill #2

## 2023-11-23 MED ORDER — CLOPIDOGREL BISULFATE 75 MG PO TABS
75.0000 mg | ORAL_TABLET | Freq: Every day | ORAL | 1 refills | Status: DC
  Filled 2023-11-23: qty 90, 90d supply, fill #0
  Filled 2024-02-16 – 2024-02-19 (×3): qty 90, 90d supply, fill #1

## 2023-11-23 MED ORDER — ISOSORBIDE MONONITRATE ER 60 MG PO TB24
60.0000 mg | ORAL_TABLET | Freq: Every day | ORAL | 11 refills | Status: DC
  Filled 2023-11-23: qty 30, 30d supply, fill #0

## 2023-11-23 MED ORDER — FLUTICASONE PROPIONATE 50 MCG/ACT NA SUSP
2.0000 | Freq: Every day | NASAL | 11 refills | Status: AC
  Filled 2023-11-23 – 2024-03-16 (×2): qty 16, 30d supply, fill #0
  Filled 2024-04-30: qty 16, 30d supply, fill #1

## 2023-11-23 MED ORDER — CYCLOBENZAPRINE HCL 5 MG PO TABS
5.0000 mg | ORAL_TABLET | Freq: Three times a day (TID) | ORAL | 0 refills | Status: DC | PRN
  Filled 2023-11-23: qty 50, 17d supply, fill #0

## 2023-11-23 MED ORDER — TORSEMIDE 20 MG PO TABS
30.0000 mg | ORAL_TABLET | Freq: Every day | ORAL | 1 refills | Status: DC
  Filled 2023-11-23: qty 135, 90d supply, fill #0

## 2023-11-23 MED ORDER — TORSEMIDE 20 MG PO TABS
40.0000 mg | ORAL_TABLET | Freq: Every day | ORAL | 6 refills | Status: DC
  Filled 2023-11-23 – 2024-02-19 (×7): qty 60, 30d supply, fill #0
  Filled 2024-03-16: qty 60, 30d supply, fill #1
  Filled 2024-04-30: qty 60, 30d supply, fill #2

## 2023-11-23 MED ORDER — LOSARTAN POTASSIUM 25 MG PO TABS
25.0000 mg | ORAL_TABLET | Freq: Every day | ORAL | 3 refills | Status: AC
  Filled 2023-11-23: qty 90, 90d supply, fill #0
  Filled 2024-02-16 – 2024-02-19 (×3): qty 90, 90d supply, fill #1
  Filled 2024-03-16 – 2024-04-30 (×2): qty 90, 90d supply, fill #2
  Filled 2024-05-16: qty 90, 90d supply, fill #3

## 2023-11-23 MED ORDER — NITROGLYCERIN 0.4 MG SL SUBL
0.4000 mg | SUBLINGUAL_TABLET | SUBLINGUAL | 3 refills | Status: AC | PRN
  Filled 2023-11-23: qty 100, 1d supply, fill #0

## 2023-11-23 NOTE — Telephone Encounter (Signed)
 Copied from CRM [id number]. Topic: Clinical - Prescription Issue >> Nov 23, 2023  8:49 AM Chasity T wrote: Reason for CRM: Patient is requesting for all of her medications to be sent over to the [place] [facility] at [facility] due to insurance issues with the current pharmacy she uses. She states she has not been able to get any of her medications since her insurance changed and spoke with someone at [facility] to request the Dr Jon sends all her medication to the pharmacy to be kept there now.   [facility] [address] [place], [place] [address] Phone number: [phone]

## 2023-11-23 NOTE — Telephone Encounter (Signed)
 Pt is requesting a refill on these medications that her PCP has been refilling for her. Would Dr. Darron like to refill these medications? Please address

## 2023-11-23 NOTE — Telephone Encounter (Signed)
*  STAT* If patient is at the pharmacy, call can be transferred to refill team.   1. Which medications need to be refilled? (please list name of each medication and dose if known)  pravastatin  (PRAVACHOL ) 10 MG tablet   potassium chloride  SA (KLOR-CON  M20) 20 MEQ tablet    clopidogrel  (PLAVIX ) 75 MG tablet    diltiazem  (CARDIZEM  CD) 180 MG 24 hr capsule    torsemide  (DEMADEX ) 20 MG tablet    nitroGLYCERIN  (NITROSTAT ) 0.4 MG SL tablet (Expired)  losartan  (COZAAR ) 25 MG tablet  2. Would you like to learn more about the convenience, safety, & potential cost savings by using the [facility]?      3. Are you open to using the [facility] (Type [facility].  ).   4. Which pharmacy/location (including street and city if local pharmacy) is medication to be sent to? [facility] Phone: [phone]  Fax: [phone]             5. Do they need a 30 day or 90 day supply? 90

## 2023-11-24 ENCOUNTER — Telehealth (HOSPITAL_COMMUNITY): Payer: Self-pay

## 2023-11-24 ENCOUNTER — Other Ambulatory Visit (HOSPITAL_COMMUNITY): Payer: Self-pay

## 2023-11-24 ENCOUNTER — Other Ambulatory Visit: Payer: Self-pay

## 2023-11-24 MED ORDER — ONETOUCH ULTRA 2 W/DEVICE KIT: 1.0000 | PACK | Freq: Every day | 0 refills | Status: DC

## 2023-11-24 MED ORDER — METFORMIN HCL 500 MG PO TABS
500.0000 mg | ORAL_TABLET | Freq: Two times a day (BID) | ORAL | 3 refills | Status: DC
  Filled 2023-11-24: qty 180, 90d supply, fill #0

## 2023-11-24 MED ORDER — ACCU-CHEK GUIDE TEST VI STRP: ORAL_STRIP | 12 refills | Status: AC

## 2023-11-24 MED ORDER — ONETOUCH ULTRA BLUE TEST VI STRP: ORAL_STRIP | 12 refills | Status: DC

## 2023-11-24 MED ORDER — ACCU-CHEK GUIDE W/DEVICE KIT
PACK | 0 refills | Status: DC
  Filled 2023-11-24 (×2): qty 1, 90d supply, fill #0

## 2023-11-24 MED ORDER — ACCU-CHEK FASTCLIX LANCETS MISC
3 refills | Status: DC
  Filled 2023-11-24: qty 102, 102d supply, fill #0
  Filled 2023-11-24: qty 102, 90d supply, fill #0

## 2023-11-24 NOTE — Telephone Encounter (Signed)
 PA request has been Received. New Encounter has been or will be created for follow up. For additional info see Pharmacy Prior Auth telephone encounter from 11/24/23.

## 2023-11-24 NOTE — Telephone Encounter (Signed)
 New sent to pharmacy

## 2023-11-24 NOTE — Telephone Encounter (Signed)
 Pharmacy Patient Advocate Encounter   Received notification from Pt Calls Messages that prior authorization for Accu-Chek system is required/requested.   Insurance verification completed.   The patient is insured through [facility] ADVANTAGE/RX ADVANCE .   Per test claim:  One Touch is preferred by the insurance.  If suggested medication is appropriate, Please send in a new RX and discontinue this one. If not, please advise as to why it's not appropriate so that we may request a Prior Authorization. Please note, some preferred medications may still require a PA.  If the suggested medications have not been trialed and there are no contraindications to their use, the PA will not be submitted, as it will not be approved.

## 2023-11-27 ENCOUNTER — Telehealth: Payer: Self-pay

## 2023-11-27 ENCOUNTER — Other Ambulatory Visit (HOSPITAL_COMMUNITY): Payer: Self-pay

## 2023-11-27 ENCOUNTER — Other Ambulatory Visit: Payer: Self-pay | Admitting: Family Medicine

## 2023-11-27 MED ORDER — METFORMIN HCL 500 MG PO TABS
1000.0000 mg | ORAL_TABLET | Freq: Two times a day (BID) | ORAL | 1 refills | Status: DC
  Filled 2023-11-29 – 2024-02-19 (×4): qty 360, 90d supply, fill #0
  Filled 2024-03-16 – 2024-04-30 (×2): qty 360, 90d supply, fill #1

## 2023-11-27 NOTE — Telephone Encounter (Signed)
 Ok to change rx to 1000mg  BID. If that is what she has been doing and is well controlled on, we want to continue that

## 2023-11-27 NOTE — Addendum Note (Signed)
 Addended by: LILIAN SEVERO RAMAN on: 11/27/2023 04:24 PM   Modules accepted: Orders

## 2023-11-27 NOTE — Telephone Encounter (Signed)
 Copied from CRM [id number]. Topic: Clinical - Prescription Issue >> Nov 27, 2023 10:27 AM Larissa RAMAN wrote: Reason for CRM: Patient states her instructions for her metformin  is incorrect. The instructions should states 2 tablets by mouth twice daily for Metformin  500 mg.   [facility] [address] ([address]), [place] - [address] ([address]) [place] [address] Phone: [phone] Fax: [phone] Hours: Not open 24 hours

## 2023-11-28 ENCOUNTER — Other Ambulatory Visit (HOSPITAL_COMMUNITY): Payer: Self-pay

## 2023-11-28 ENCOUNTER — Other Ambulatory Visit: Payer: Self-pay

## 2023-11-29 ENCOUNTER — Other Ambulatory Visit (HOSPITAL_COMMUNITY): Payer: Self-pay

## 2023-11-29 ENCOUNTER — Other Ambulatory Visit (HOSPITAL_BASED_OUTPATIENT_CLINIC_OR_DEPARTMENT_OTHER): Payer: Self-pay

## 2023-11-29 ENCOUNTER — Other Ambulatory Visit: Payer: Self-pay

## 2023-11-30 ENCOUNTER — Other Ambulatory Visit (HOSPITAL_COMMUNITY): Payer: Self-pay

## 2023-12-12 DIAGNOSIS — M542 Cervicalgia: Secondary | ICD-10-CM | POA: Diagnosis not present

## 2023-12-12 DIAGNOSIS — G5603 Carpal tunnel syndrome, bilateral upper limbs: Secondary | ICD-10-CM | POA: Diagnosis not present

## 2023-12-14 ENCOUNTER — Telehealth: Payer: Self-pay | Admitting: Cardiovascular Disease

## 2023-12-14 NOTE — Telephone Encounter (Signed)
   Pre-operative Risk Assessment    Patient Name: Jessica Wheeler  DOB: Dec 25, 1940 MRN: [id number]   Date of last office visit: 05/08 Dr Darron Date of next office visit: 11/11 Dr Darron   Request for Surgical Clearance    Procedure:  carpal tunnel release  Date of Surgery:  Clearance TBD                                Surgeon:  Dr Reyes Budge Surgeon's Group or Practice Name:  [facility] Phone number:  [phone]. [phone] Fax number:  [phone] / [phone]   Type of Clearance Requested:   - Medical    Type of Anesthesia:  General    Additional requests/questions:    [name]   12/14/2023, 3:25 PM

## 2023-12-15 NOTE — Telephone Encounter (Signed)
 Spoke with the patient to schedule her preop tele appt and she stated that she doesn't plan to schedule her procedure until December 2025 and would like to consult with Dr. Darron during her appointment in November with him prior to deciding to get cleared for the procedure.

## 2023-12-15 NOTE — Telephone Encounter (Signed)
   Name: Jessica Wheeler  DOB: 08/16/1940  MRN: [id number]  Primary Cardiologist: Evalene Lunger, MD   Preoperative team, please contact this patient and set up a phone call appointment for further preoperative risk assessment. Please obtain consent and complete medication review. Thank you for your help.  I confirm that guidance regarding antiplatelet and oral anticoagulation therapy has been completed and, if necessary, noted below.  Per office protocol, if patient is without any new symptoms or concerns at the time of their virtual visit, she may hold clopidogrel  for 5 days and Aspirin  for 7 days prior to procedure. Please resume clopidogrel  and aspirin  as soon as possible postprocedure, at the discretion of the surgeon.    I also confirmed the patient resides in the state of [place] . As per [facility] Medical Board telemedicine laws, the patient must reside in the state in which the provider is licensed.   Lamarr Satterfield, NP 12/15/2023, 9:03 AM [facility]

## 2023-12-18 LAB — MICROALBUMIN / CREATININE URINE RATIO: Microalb Creat Ratio: <20

## 2024-01-30 DIAGNOSIS — Z961 Presence of intraocular lens: Secondary | ICD-10-CM | POA: Diagnosis not present

## 2024-01-30 DIAGNOSIS — H353132 Nonexudative age-related macular degeneration, bilateral, intermediate dry stage: Secondary | ICD-10-CM | POA: Diagnosis not present

## 2024-01-30 DIAGNOSIS — H43813 Vitreous degeneration, bilateral: Secondary | ICD-10-CM | POA: Diagnosis not present

## 2024-01-30 DIAGNOSIS — Z7984 Long term (current) use of oral hypoglycemic drugs: Secondary | ICD-10-CM | POA: Diagnosis not present

## 2024-01-30 DIAGNOSIS — E119 Type 2 diabetes mellitus without complications: Secondary | ICD-10-CM | POA: Diagnosis not present

## 2024-01-30 DIAGNOSIS — M170 Bilateral primary osteoarthritis of knee: Secondary | ICD-10-CM | POA: Diagnosis not present

## 2024-01-30 DIAGNOSIS — H401131 Primary open-angle glaucoma, bilateral, mild stage: Secondary | ICD-10-CM | POA: Diagnosis not present

## 2024-02-13 ENCOUNTER — Encounter: Payer: Self-pay | Admitting: Family Medicine

## 2024-02-13 ENCOUNTER — Ambulatory Visit: Admitting: Family Medicine

## 2024-02-13 VITALS — BP 122/50 | HR 61 | Ht 63.0 in | Wt 130.2 lb

## 2024-02-13 DIAGNOSIS — I5022 Chronic systolic (congestive) heart failure: Secondary | ICD-10-CM | POA: Diagnosis not present

## 2024-02-13 DIAGNOSIS — M1712 Unilateral primary osteoarthritis, left knee: Secondary | ICD-10-CM

## 2024-02-13 DIAGNOSIS — I152 Hypertension secondary to endocrine disorders: Secondary | ICD-10-CM

## 2024-02-13 DIAGNOSIS — E785 Hyperlipidemia, unspecified: Secondary | ICD-10-CM

## 2024-02-13 DIAGNOSIS — Z79899 Other long term (current) drug therapy: Secondary | ICD-10-CM | POA: Diagnosis not present

## 2024-02-13 DIAGNOSIS — I25118 Atherosclerotic heart disease of native coronary artery with other forms of angina pectoris: Secondary | ICD-10-CM

## 2024-02-13 DIAGNOSIS — E1159 Type 2 diabetes mellitus with other circulatory complications: Secondary | ICD-10-CM | POA: Diagnosis not present

## 2024-02-13 DIAGNOSIS — E1169 Type 2 diabetes mellitus with other specified complication: Secondary | ICD-10-CM | POA: Diagnosis not present

## 2024-02-13 DIAGNOSIS — Z7984 Long term (current) use of oral hypoglycemic drugs: Secondary | ICD-10-CM | POA: Diagnosis not present

## 2024-02-13 DIAGNOSIS — Z23 Encounter for immunization: Secondary | ICD-10-CM | POA: Diagnosis not present

## 2024-02-13 NOTE — Assessment & Plan Note (Signed)
 F/b cardiology  Stable and euvolemic

## 2024-02-13 NOTE — Assessment & Plan Note (Signed)
 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 She is considering knee replacement surgery but is hesitant about cortisone injections due to past family experiences. Discussed the necessity of trying injections before insurance approval for surgery. She is interested in pursuing knee replacement with Dr. Elena, pending cardiology clearance. Discussed that spinal anesthesia is safer for her heart condition and mental recovery. - Discuss knee replacement with cardiologist Dr. Liberty for clearance - Consider cortisone injections as a prerequisite for knee replacement surgery - Refer to Dr. Elena for potential knee replacement surgery

## 2024-02-13 NOTE — Assessment & Plan Note (Signed)
 Hypertension with recent office reading of 146/50 mmHg. Home readings are typically lower, around 115-120 mmHg systolic. Blood pressure management is ongoing with current medications. - Recheck blood pressure before leaving - Continue current antihypertensive regimen

## 2024-02-13 NOTE — Progress Notes (Signed)
 Established patient visit   Patient: Jessica Wheeler   DOB: 1940/09/26   83 y.o. Female  MRN: [id number] Visit Date: 02/13/2024  Today's healthcare provider: Jon Eva, MD   Chief Complaint  Patient presents with   Medical Management of Chronic Issues    Patient is present for 6 month follow-up. She repo   Diabetes    Symptoms: foot ulceration, Home blood sugar records: fasting range: 133-172 mg/dL Episodes of hypoglycemia? Yes,weakness Current insulin  regiment: none   Hyperlipidemia    She reports numbness and tingling in her hands   Hypertension    She monitors at home ranging in the are of 130's/50-60's. No symptoms. No smoking   Subjective    HPI HPI     Medical Management of Chronic Issues    Additional comments: Patient is present for 6 month follow-up. She repo        Diabetes    Additional comments: Symptoms: foot ulceration, Home blood sugar records: fasting range: 133-172 mg/dL Episodes of hypoglycemia? Yes,weakness Current insulin  regiment: none        Hyperlipidemia    Additional comments: She reports numbness and tingling in her hands        Hypertension    Additional comments: She monitors at home ranging in the are of 130's/50-60's. No symptoms. No smoking      Last edited by Lilian Fitzpatrick, CMA on 02/13/2024  8:54 AM.       Discussed the use of AI scribe software for clinical note transcription with the patient, who gave verbal consent to proceed.  History of Present Illness   Jessica Wheeler is an 83 year old female with osteoarthritis who presents with knee pain and consideration for knee replacement.  She experiences significant knee pain from osteoarthritis, affecting her quality of life. A recent flare-up in both knees led to an emergency room visit, where x-rays indicated she is nearly bone-on-bone. She refuses cortisone injections due to concerns about side effects, as her mother had a diabetic shock after one. She  manages pain with Epsom salt hot packs and ice, which provide some relief.  Her current medications include pravastatin  10 mg daily, metformin  1000 mg twice daily, losartan  25 mg daily, diltiazem  120 mg three times daily, and clopidogrel . She regularly monitors her blood sugar and uses specific lancets and strips due to nerve damage in her fingers.  She experiences recurring chest pain described as a 'tiny bit of pressure' that comes and goes. She plans to discuss this with her cardiologist soon. She had a heart attack over a year ago and is under cardiology care.  She experiences anxiety and difficulty sleeping, partly due to her dog's allergies. She takes a probiotic daily, which has improved her gastrointestinal health, reducing burning sensations in her bowels and kidneys.       Medications: Outpatient Medications Prior to Visit  Medication Sig   clopidogrel  (PLAVIX ) 75 MG tablet Take 1 tablet (75 mg total) by mouth daily.   diltiazem  (CARDIZEM  CD) 120 MG 24 hr capsule Take 1 capsule (120 mg total) by mouth 3 (three) times daily.   estradiol  (ESTRACE ) 0.1 MG/GM vaginal cream Apply pea sized amount to tip of finger to urethra before bed. Wash hands well after application. Use Monday, Wednesday and Friday   fluticasone  (FLONASE ) 50 MCG/ACT nasal spray Place 2 sprays into both nostrils daily.   glucose blood (ACCU-CHEK GUIDE TEST) test strip To test daily and as needed  Lancets Misc. (ACCU-CHEK FASTCLIX LANCET) KIT 1 each by Does not apply route as directed.   latanoprost  (XALATAN ) 0.005 % ophthalmic solution Place 1 drop into both eyes at bedtime.    losartan  (COZAAR ) 25 MG tablet Take 1 tablet (25 mg total) by mouth daily.   Magnesium  250 MG TABS Take 250 mg by mouth 3 (three) times daily.   metFORMIN  (GLUCOPHAGE ) 500 MG tablet Take 2 tablets (1,000 mg total) by mouth 2 (two) times daily with a meal.   nitroGLYCERIN  (NITROSTAT ) 0.4 MG SL tablet Place 1 tablet (0.4 mg total) under the  tongue every 5 (five) minutes as needed for chest pain.   potassium chloride  SA (KLOR-CON  M20) 20 MEQ tablet Take 1.5 tablets (30 mEq total) by mouth daily.   pravastatin  (PRAVACHOL ) 10 MG tablet Take 1 tablet (10 mg total) by mouth daily.   torsemide  (DEMADEX ) 20 MG tablet Take 2 tablets (40 mg total) by mouth daily.   vitamin B-12 (CYANOCOBALAMIN ) 1000 MCG tablet Take 1,000 mcg by mouth daily.   [DISCONTINUED] Blood Glucose Monitoring Suppl (ONE TOUCH ULTRA 2) w/Device KIT 1 each by Does not apply route daily at 2 PM.   [DISCONTINUED] cyclobenzaprine  (FLEXERIL ) 5 MG tablet    [DISCONTINUED] cyclobenzaprine  (FLEXERIL ) 5 MG tablet Take 1 tablet (5 mg total) by mouth 3 (three) times daily as needed for muscle spasm.   [DISCONTINUED] glucose blood (ONETOUCH ULTRA BLUE TEST) test strip Use as instructed   [DISCONTINUED] latanoprost  (XALATAN ) 0.005 % ophthalmic solution Place 1 drop into both eyes at bedtime.   Facility-Administered Medications Prior to Visit  Medication Dose Route Frequency Provider   ticagrelor  (BRILINTA ) tablet    PRN End, Lonni, MD    Review of Systems     Objective    BP (!) 122/50 Comment: home reading  Pulse 61   Ht 5' 3 (1.6 m)   Wt 130 lb 3.2 oz (59.1 kg)   SpO2 100%   BMI 23.06 kg/m    Physical Exam Vitals reviewed.  Constitutional:      General: She is not in acute distress.    Appearance: Normal appearance. She is well-developed. She is not diaphoretic.  HENT:     Head: Normocephalic and atraumatic.  Eyes:     General: No scleral icterus.    Conjunctiva/sclera: Conjunctivae normal.  Neck:     Thyroid : No thyromegaly.  Cardiovascular:     Rate and Rhythm: Normal rate and regular rhythm.     Heart sounds: Normal heart sounds. No murmur heard. Pulmonary:     Effort: Pulmonary effort is normal. No respiratory distress.     Breath sounds: Normal breath sounds. No wheezing, rhonchi or rales.  Musculoskeletal:     Cervical back: Neck supple.      Right lower leg: No edema.     Left lower leg: No edema.  Lymphadenopathy:     Cervical: No cervical adenopathy.  Skin:    General: Skin is warm and dry.     Findings: No rash.  Neurological:     Mental Status: She is alert and oriented to person, place, and time. Mental status is at baseline.  Psychiatric:        Mood and Affect: Mood normal.        Behavior: Behavior normal.      No results found for any visits on 02/13/24.  Assessment & Plan     Problem List Items Addressed This Visit       Cardiovascular and  Mediastinum   Hypertension associated with diabetes (HCC)   Hypertension with recent office reading of 146/50 mmHg. Home readings are typically lower, around 115-120 mmHg systolic. Blood pressure management is ongoing with current medications. - Recheck blood pressure before leaving - Continue current antihypertensive regimen      Relevant Orders   Comprehensive metabolic panel with GFR   CAD (coronary artery disease)   Myocardial infarction over a year ago. Currently well-managed on clopidogrel  and other cardiac medications. - Continue clopidogrel  as prescribed - Follow up with cardiologist Dr. 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 Blood glucose monitoring is challenging due to neuropathy in fingers, but she has a system that works for her. A1c is expected to be around 6.6-6.7%. - Continue metformin  1000 mg twice daily - Recheck A1c today - Discuss diabetic supplies and potential cost issues with pharmacy      Relevant Orders   Hemoglobin A1c   Hyperlipidemia associated with type 2 diabetes mellitus (HCC)   Hyperlipidemia managed with pravastatin  10 mg daily. Cholesterol levels  to be rechecked today. - Continue pravastatin  10 mg daily - Recheck cholesterol levels today       Relevant Orders   Lipid Panel With LDL/HDL Ratio     Musculoskeletal and Integument   Primary osteoarthritis of left knee   Severe bilateral (L>R) knee osteoarthritis with near bone-on-bone contact, affecting quality of life. She is considering knee replacement surgery but is hesitant about cortisone injections due to past family experiences. Discussed the necessity of trying injections before insurance approval for surgery. She is interested in pursuing knee replacement with Dr. Elena, pending cardiology clearance. Discussed that spinal anesthesia is safer for her heart condition and mental recovery. - Discuss knee replacement with cardiologist Dr. Liberty for clearance - Consider cortisone injections as a prerequisite for knee replacement surgery - Refer to Dr. Elena for potential knee replacement surgery      Other Visit Diagnoses       Immunization due       Relevant Orders   Flu vaccine HIGH DOSE PF(Fluzone Trivalent) (Completed)     Long-term use of high-risk medication       Relevant Orders   CBC w/Diff/Platelet        Return in about 6 months (around 08/13/2024) for CPE.      I personally spent a total of 42 minutes in the care of the patient today including preparing to see the patient, getting/reviewing separately obtained history, performing a medically appropriate exam/evaluation, counseling and educating, placing orders, documenting clinical information in the EHR, and coordinating care.   Jon Eva, MD  [facility] [phone] (phone) [phone] (fax)  [facility]

## 2024-02-13 NOTE — Assessment & Plan Note (Signed)
 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 Blood glucose monitoring is challenging due to neuropathy in fingers, but she has a system that works for her. A1c is expected to be around 6.6-6.7%. - Continue metformin  1000 mg twice daily - Recheck A1c today - Discuss diabetic supplies and potential cost issues with pharmacy

## 2024-02-13 NOTE — Assessment & Plan Note (Signed)
 Myocardial infarction over a year ago. Currently well-managed on clopidogrel  and other cardiac medications. - Continue clopidogrel  as prescribed - Follow up with cardiologist Dr. Liberty on October 30th

## 2024-02-13 NOTE — Assessment & Plan Note (Signed)
 Hyperlipidemia managed with pravastatin  10 mg daily. Cholesterol levels to be rechecked today. - Continue pravastatin  10 mg daily - Recheck cholesterol levels today

## 2024-02-14 ENCOUNTER — Ambulatory Visit: Payer: Self-pay | Admitting: Family Medicine

## 2024-02-14 LAB — COMPREHENSIVE METABOLIC PANEL WITH GFR
ALT: 19 IU/L (ref 0–32)
AST: 20 IU/L (ref 0–40)
Albumin: 4.5 g/dL (ref 3.7–4.7)
Alkaline Phosphatase: 72 IU/L (ref 48–129)
BUN/Creatinine Ratio: 27 (ref 12–28)
BUN: 24 mg/dL (ref 8–27)
Bilirubin Total: 0.6 mg/dL (ref 0.0–1.2)
CO2: 24 mmol/L (ref 20–29)
Calcium: 9.7 mg/dL (ref 8.7–10.3)
Chloride: 99 mmol/L (ref 96–106)
Creatinine, Ser: 0.89 mg/dL (ref 0.57–1.00)
Globulin, Total: 2.5 g/dL (ref 1.5–4.5)
Glucose: 150 mg/dL — ABNORMAL HIGH (ref 70–99)
Potassium: 4 mmol/L (ref 3.5–5.2)
Sodium: 138 mmol/L (ref 134–144)
Total Protein: 7 g/dL (ref 6.0–8.5)
eGFR: 63 mL/min/1.73 (ref >59)

## 2024-02-14 LAB — CBC WITH DIFFERENTIAL/PLATELET
Basophils Absolute: 0 x10E3/uL (ref 0.0–0.2)
Basos: 1 %
EOS (ABSOLUTE): 0.2 x10E3/uL (ref 0.0–0.4)
Eos: 3 %
Hematocrit: 38.5 % (ref 34.0–46.6)
Hemoglobin: 12.6 g/dL (ref 11.1–15.9)
Immature Grans (Abs): 0 x10E3/uL (ref 0.0–0.1)
Immature Granulocytes: 0 %
Lymphocytes Absolute: 1.9 x10E3/uL (ref 0.7–3.1)
Lymphs: 35 %
MCH: 31.3 pg (ref 26.6–33.0)
MCHC: 32.7 g/dL (ref 31.5–35.7)
MCV: 96 fL (ref 79–97)
Monocytes Absolute: 0.6 x10E3/uL (ref 0.1–0.9)
Monocytes: 10 %
Neutrophils Absolute: 2.9 x10E3/uL (ref 1.4–7.0)
Neutrophils: 51 %
Platelets: 238 x10E3/uL (ref 150–450)
RBC: 4.03 x10E6/uL (ref 3.77–5.28)
RDW: 12.8 % (ref 11.7–15.4)
WBC: 5.6 x10E3/uL (ref 3.4–10.8)

## 2024-02-14 LAB — LIPID PANEL WITH LDL/HDL RATIO
Cholesterol, Total: 163 mg/dL (ref 100–199)
HDL: 87 mg/dL (ref >39)
LDL Chol Calc (NIH): 62 mg/dL (ref 0–99)
LDL/HDL Ratio: 0.7 ratio (ref 0.0–3.2)
Triglycerides: 73 mg/dL (ref 0–149)
VLDL Cholesterol Cal: 14 mg/dL (ref 5–40)

## 2024-02-14 LAB — HEMOGLOBIN A1C
Est. average glucose Bld gHb Est-mCnc: 148 mg/dL
Hgb A1c MFr Bld: 6.8 % — ABNORMAL HIGH (ref 4.8–5.6)

## 2024-02-16 ENCOUNTER — Other Ambulatory Visit: Payer: Self-pay

## 2024-02-16 ENCOUNTER — Other Ambulatory Visit (HOSPITAL_COMMUNITY): Payer: Self-pay

## 2024-02-17 ENCOUNTER — Other Ambulatory Visit (HOSPITAL_COMMUNITY): Payer: Self-pay

## 2024-02-19 ENCOUNTER — Other Ambulatory Visit: Payer: Self-pay

## 2024-02-19 ENCOUNTER — Other Ambulatory Visit (HOSPITAL_COMMUNITY): Payer: Self-pay

## 2024-02-19 DIAGNOSIS — E1169 Type 2 diabetes mellitus with other specified complication: Secondary | ICD-10-CM | POA: Diagnosis not present

## 2024-02-19 DIAGNOSIS — E118 Type 2 diabetes mellitus with unspecified complications: Secondary | ICD-10-CM | POA: Diagnosis not present

## 2024-02-19 MED ORDER — LATANOPROST 0.005 % OP SOLN
1.0000 [drp] | Freq: Every day | OPHTHALMIC | 3 refills | Status: AC
  Filled 2024-02-19 (×2): qty 7.5, 75d supply, fill #0

## 2024-02-20 ENCOUNTER — Other Ambulatory Visit (HOSPITAL_COMMUNITY): Payer: Self-pay

## 2024-02-20 ENCOUNTER — Other Ambulatory Visit: Payer: Self-pay

## 2024-02-21 ENCOUNTER — Other Ambulatory Visit (HOSPITAL_COMMUNITY): Payer: Self-pay

## 2024-02-22 ENCOUNTER — Other Ambulatory Visit (HOSPITAL_COMMUNITY): Payer: Self-pay

## 2024-02-29 ENCOUNTER — Ambulatory Visit: Attending: Cardiovascular Disease | Admitting: Cardiovascular Disease

## 2024-02-29 ENCOUNTER — Encounter: Payer: Self-pay | Admitting: Cardiovascular Disease

## 2024-02-29 VITALS — BP 136/50 | HR 70 | Ht 63.0 in | Wt 132.6 lb

## 2024-02-29 DIAGNOSIS — E785 Hyperlipidemia, unspecified: Secondary | ICD-10-CM

## 2024-02-29 DIAGNOSIS — I2510 Atherosclerotic heart disease of native coronary artery without angina pectoris: Secondary | ICD-10-CM

## 2024-02-29 DIAGNOSIS — I10 Essential (primary) hypertension: Secondary | ICD-10-CM | POA: Diagnosis not present

## 2024-02-29 DIAGNOSIS — I5022 Chronic systolic (congestive) heart failure: Secondary | ICD-10-CM | POA: Diagnosis not present

## 2024-02-29 NOTE — Progress Notes (Signed)
 Cardiology Office Note   Date:  02/29/2024   ID:  Jessica Wheeler, DOB 1941-04-12, MRN [id number]  PCP:  Myrla Jon HERO, MD  Cardiologist:   Deatrice Cage, MD   Chief Complaint  Patient presents with   6 month follow up    6 month follow up / The patient denies SOB / The patient c/o chest pain / left rib cage breast tighten.      History of Present Illness: Jessica Wheeler is a 83 y.o. female who is here today for a follow-up visit regarding coronary artery disease and chronic systolic heart failure.  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  Her ejection fraction was 35 to 40% in March 2024.  She had a stress MRI done in August 2024 which showed no evidence of ischemia with normal RV function and low normal LV systolic function with an EF of 54% with apical akinesis. She was hospitalized for blood loss anemia due to GI bleed while she was on dual antiplatelet therapy in 2024 .  She was subsequently switched from ticagrelor  to clopidogrel  with no issues.  She is very sensitive to medications and allergic to multiple medications including beta-blockers and ivabradine.  She also did not tolerate SGLT2 inhibitors due to myalgia.  She has statin myalgia and is able to take pravastatin  only.  She has been doing reasonably well overall even though she has chronic atypical chest pain.  She gets brief episodes at rest but does not have exertional symptoms.  She is mostly limited by severe arthritis especially the left knee and she is thinking about getting evaluation for knee replacement.  She likes to walk for exercise and has not been able to do so.    Past Medical History:  Diagnosis Date   Allergy    Anemia 3/24   Arthritis    Bundle branch  block, right    C7 cervical fracture (HCC)    CAD (coronary artery disease)    a. 06/2005 Cath: LM nl, LAD 102m, LCX nl, RCA 20ost->med rx; b. 07/2022 NSTEMI/PCI: LM 40d, LAD 50p, 40p/m, 21m (2.0x19 Onyx Frontier DES), RI 20, LCX small, mild/diff dzs, RCA 60ost, 65p, 60d. EF 25-35%.   CHF (congestive heart failure) (HCC)    Chronic HFrEF (heart failure with reduced ejection fraction) (HCC)    a. 10/2011 Echo: EF 65-70%; b. 07/2022 Echo: EF 35-40%, glob HK, basal wall motion best preserved. GrI DD, nl RV fxn, mild MR.   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 07/2022 Echo: EF 35-40%.   Myocardial infarction (HCC) 07/07/22   Neuromuscular disorder (HCC) 1982   Personal history of  tobacco use, presenting hazards to health    RBBB    Ulcer     Past Surgical History:  Procedure Laterality Date   ABDOMINAL HYSTERECTOMY  1991   ANTERIOR CERVICAL CORPECTOMY N/A 03/30/2020   Procedure: CORPECTOMY CERVICAL FOUR - CERVICAL FIVE;  Surgeon: Mavis Purchase, MD;  Location: [facility] OR;  Service: Neurosurgery;  Laterality: N/A;  anterior   APPENDECTOMY     BACK SURGERY  1982   for lumbar ruptured disc. No hardware, no discectomy, no fusion   BLADDER SURGERY     BREAST BIOPSY Right 1980's   benign   BREAST EXCISIONAL BIOPSY Right 1980s   benign   BREAST SURGERY     biopsy   CARDIAC CATHETERIZATION     Dr. Bosie, few years ago   CATARACT EXTRACTION W/PHACO Left 08/31/2015   Procedure:  CATARACT EXTRACTION PHACO AND INTRAOCULAR LENS PLACEMENT (IOC);  Surgeon: Donzell Arlyce Budd, MD;  Location: [facility];  Service: Ophthalmology;  Laterality: Left;  DIABETIC - oral meds   CATARACT EXTRACTION W/PHACO Right 10/12/2015   Procedure: CATARACT EXTRACTION PHACO AND INTRAOCULAR LENS PLACEMENT (IOC) right eye;  Surgeon: Donzell Arlyce Budd, MD;  Location: [facility];  Service: Ophthalmology;  Laterality: Right;  DIABETIC - oral meds   CHOLECYSTECTOMY  1994   COLONOSCOPY  2008,02/22/2012   Dr. Sankar-2013   COLONOSCOPY WITH PROPOFOL  N/A 04/04/2017   Procedure: COLONOSCOPY WITH PROPOFOL ;  Surgeon: Dellie Louanne MATSU, MD;  Location: [facility] END[facility]COPY;  Service: Endoscopy;  Laterality: N/A;   CORONARY STENT INTERVENTION N/A 07/08/2022   Procedure: CORONARY STENT INTERVENTION;  Surgeon: Mady Bruckner, MD;  Location: [facility] INVASIVE CV LAB;  Service: Cardiovascular;  Laterality: N/A;   EYE SURGERY  2017   POSTERIOR CERVICAL FUSION/FORAMINOTOMY N/A 04/01/2020   Procedure: POSTERIOR CERVICAL FUSION/FORAMINOTOMY CERVICAL FOUR- CERVICAL SIX;  Surgeon: Mavis Purchase, MD;  Location: [facility] OR;  Service: Neurosurgery;  Laterality: N/A;   REFRACTIVE SURGERY     RIGHT/LEFT HEART CATH AND CORONARY ANGIOGRAPHY N/A 07/08/2022   Procedure: RIGHT/LEFT HEART CATH AND CORONARY ANGIOGRAPHY;  Surgeon: Mady Bruckner, MD;  Location: [facility] INVASIVE CV LAB;  Service: Cardiovascular;  Laterality: N/A;   SPINE SURGERY  1990   03/30/2020. 04/01/20     Current Outpatient Medications  Medication Sig Dispense Refill   clopidogrel  (PLAVIX ) 75 MG tablet Take 1 tablet (75 mg total) by mouth daily. 90 tablet 1   diltiazem  (CARDIZEM  CD) 120 MG 24 hr capsule Take 1 capsule (120 mg total) by mouth 3 (three) times daily. 270 capsule 4   estradiol  (ESTRACE ) 0.1 MG/GM vaginal cream Apply pea sized amount to tip of finger to urethra before bed. Wash hands well after application. Use Monday,  Wednesday and Friday 42.5 g 12   fluticasone  (FLONASE ) 50 MCG/ACT nasal spray Place 2 sprays into both nostrils daily. 16 g 11   glucose blood (ACCU-CHEK GUIDE TEST) test strip To test daily and as needed 100 each 12   Lancets Misc. (ACCU-CHEK FASTCLIX LANCET) KIT 1 each by Does not apply route as directed. 1 kit 1   latanoprost  (XALATAN ) 0.005 % ophthalmic solution Place 1 drop into both eyes at bedtime. 7.5 mL 3   losartan  (COZAAR ) 25 MG tablet Take 1 tablet (25 mg total) by mouth daily. 90 tablet 3   Magnesium  250 MG TABS Take 250 mg by mouth 3 (three) times daily.     metFORMIN  (GLUCOPHAGE ) 500 MG tablet Take 2 tablets (1,000 mg total) by mouth 2 (two) times daily  with a meal. 360 tablet 1   nitroGLYCERIN  (NITROSTAT ) 0.4 MG SL tablet Place 1 tablet (0.4 mg total) under the tongue every 5 (five) minutes as needed for chest pain. 100 tablet 3   potassium chloride  SA (KLOR-CON  M20) 20 MEQ tablet Take 1.5 tablets (30 mEq total) by mouth daily. 135 tablet 3   pravastatin  (PRAVACHOL ) 10 MG tablet Take 1 tablet (10 mg total) by mouth daily. 90 tablet 3   torsemide  (DEMADEX ) 20 MG tablet Take 2 tablets (40 mg total) by mouth daily. 60 tablet 6   vitamin B-12 (CYANOCOBALAMIN ) 1000 MCG tablet Take 1,000 mcg by mouth daily.     No current facility-administered medications for this visit.   Facility-Administered Medications Ordered in Other Visits  Medication Dose Route Frequency Provider Last Rate Last Admin   ticagrelor  (BRILINTA ) tablet    PRN End, Lonni, MD   180 mg at 07/08/22 1605    Allergies:   Beta adrenergic blockers, Corlanor [ivabradine], Jardiance  [empagliflozin ], Levaquin  [levofloxacin  in d5w], Lisinopril , Zetia  [ezetimibe ], Prevnar [pneumococcal 13-val conj vacc], and Sulfa antibiotics    Social History:  The patient  reports that she quit smoking about 20 years ago. Her smoking use included cigarettes. She started smoking about 35 years ago. She has a 15 pack-year smoking  history. She has never used smokeless tobacco. She reports that she does not drink alcohol and does not use drugs.   Family History:  The patient's family history includes Anxiety disorder in her mother; Arthritis in her father; Breast cancer (age of onset: 8) in her cousin; Breast cancer (age of onset: 36) in her maternal aunt; Cancer in her brother; Colon cancer in her brother; Diabetes in her mother; Heart attack in her father; Heart disease in her father; Hypertension in her father, maternal grandfather, maternal grandmother, and mother; Rheum arthritis in her father.    ROS:  Please see the history of present illness.   Otherwise, review of systems are positive for none.   All other systems are reviewed and negative.    PHYSICAL EXAM: VS:  BP (!) 136/50 (BP Location: Left Arm, Patient Position: Sitting, Cuff Size: Normal) Comment: post EKG  Pulse 70   Ht 5' 3 (1.6 m)   Wt 132 lb 9.6 oz (60.1 kg)   SpO2 98%   BMI 23.49 kg/m  , BMI Body mass index is 23.49 kg/m. GEN: Well nourished, well developed, in no acute distress  HEENT: normal  Neck: no JVD, carotid bruits, or masses Cardiac: RRR; no rubs, or gallops,no edema .  2/6 systolic murmur in the aortic area which radiates to the carotid arteries Respiratory:  clear to auscultation bilaterally, normal work of breathing GI: soft, nontender, nondistended, + BS MS: no deformity or atrophy  Skin: warm and dry, no rash Neuro:  Strength and sensation are intact Psych: euthymic mood, full affect   EKG:  EKG is ordered today. The ekg ordered today demonstrates : Normal sinus rhythm Right bundle branch block Possible Anteroseptal infarct (cited on or before 07-Sep-2023) When compared with ECG of 07-Sep-2023 09:47, Questionable change in initial forces of Septal leads    Recent Labs: 03/08/2023: Magnesium  2.1 07/13/2023: B Natriuretic Peptide 120.6 02/13/2024: ALT 19; BUN 24; Creatinine, Ser 0.89; Hemoglobin 12.6; Platelets 238;  Potassium 4.0; Sodium 138    Lipid Panel    Component Value Date/Time   CHOL 163 02/13/2024 0941   TRIG 73 02/13/2024 0941   HDL 87 02/13/2024 0941   CHOLHDL 1.9 07/08/2022 0536  VLDL 12 07/08/2022 0536   LDLCALC 62 02/13/2024 0941      Wt Readings from Last 3 Encounters:  02/29/24 132 lb 9.6 oz (60.1 kg)  02/13/24 130 lb 3.2 oz (59.1 kg)  09/07/23 131 lb 9.6 oz (59.7 kg)           No data to display            ASSESSMENT AND PLAN:  1.  Coronary artery disease involving native coronary arteries without angina: Chronic atypical chest pain is not consistent with angina.  Continue medical therapy.  She is on long-term treatment with clopidogrel  without aspirin .  2.  Chronic systolic heart failure: Most recent ejection fraction was 54% by cardiac MRI.  She is highly intolerant to beta-blockers and did not tolerate SGLT2 inhibitors.  She feels best on diltiazem  which will be continued and continue losartan  and torsemide .  She appears to be euvolemic.  3.  Hyperlipidemia: Statin induced myalgia.  She is able to tolerate pravastatin .  Most recent lipid profile showed an LDL of 62  4.  Essential hypertension: Blood pressure is well-controlled on current medications  5.  Preop cardiovascular evaluation for possible knee surgery: She is at moderate risk from a cardiac standpoint with no need for further ischemic cardiac evaluation given that she had a stress test in 2024.    Disposition:   FU with me in 6 months  Signed,  Deatrice Cage, MD  02/29/2024 9:47 AM    [facility]

## 2024-02-29 NOTE — Patient Instructions (Signed)
 Medication Instructions:  No changes *If you need a refill on your cardiac medications before your next appointment, please call your pharmacy*  Lab Work: None ordered If you have labs (blood work) drawn today and your tests are completely normal, you will receive your results only by: MyChart Message (if you have MyChart) OR A paper copy in the mail If you have any lab test that is abnormal or we need to change your treatment, we will call you to review the results.  Testing/Procedures: None ordered  Follow-Up: At [facility], you and your health needs are our priority.  As part of our continuing mission to provide you with exceptional heart care, our providers are all part of one team.  This team includes your primary Cardiologist (physician) and Advanced Practice Providers or APPs (Physician Assistants and Nurse Practitioners) who all work together to provide you with the care you need, when you need it.  Your next appointment:   6 month(s)  Provider:   You may see Dr. Alvenia Aus or one of the following Advanced Practice Providers on your designated Care Team:   Laneta Pintos, NP Gildardo Labrador, PA-C Varney Gentleman, PA-C Cadence [place], PA-C Ronald Cockayne, NP Morey Ar, NP    We recommend signing up for the patient portal called MyChart.  Sign up information is provided on this After Visit Summary.  MyChart is used to connect with patients for Virtual Visits (Telemedicine).  Patients are able to view lab/test results, encounter notes, upcoming appointments, etc.  Non-urgent messages can be sent to your provider as well.   To learn more about what you can do with MyChart, go to [email or web address].

## 2024-03-07 ENCOUNTER — Telehealth: Payer: Self-pay | Admitting: Cardiovascular Disease

## 2024-03-07 NOTE — Telephone Encounter (Signed)
   Pre-operative Risk Assessment    Patient Name: Jessica Wheeler  DOB: 05-Aug-1940 MRN: [id number]   Date of last office visit: unknown Date of next office visit: unknown   Request for Surgical Clearance    Procedure:  Lumbar fusion  Date of Surgery:  Clearance TBD                                Surgeon:  DR. Reyes Budge Surgeon's Group or Practice Name:  [facility] Phone number:  [phone] Fax number:  [phone]   Type of Clearance Requested:   - Medical    Type of Anesthesia:  General    Additional requests/questions:    SignedBerwyn LELON Sprung   03/07/2024, 4:32 PM

## 2024-03-08 ENCOUNTER — Other Ambulatory Visit: Payer: Self-pay | Admitting: Family Medicine

## 2024-03-08 NOTE — Telephone Encounter (Signed)
   Patient Name: Jessica Wheeler  DOB: May 29, 1940 MRN: [id number]  Primary Cardiologist: Deatrice Cage, MD  Chart reviewed as part of pre-operative protocol coverage. Given past medical history and time since last visit, based on ACC/AHA guidelines, Jessica Wheeler is at acceptable risk for the planned procedure without further cardiovascular testing.   Per Dr. Cage on 02/29/2024 Preop cardiovascular evaluation for possible knee surgery: She is at moderate risk from a cardiac standpoint with no need for further ischemic cardiac evaluation given that she had a stress test in 2024.   The patient was advised that if she develops new symptoms prior to surgery to contact our office to arrange for a follow-up visit, and she verbalized understanding.  I will route this recommendation to the requesting party via Epic fax function and remove from pre-op pool.  Please call with questions.  Lamarr Satterfield, NP 03/08/2024, 8:49 AM

## 2024-03-12 ENCOUNTER — Ambulatory Visit: Admitting: Cardiovascular Disease

## 2024-03-16 ENCOUNTER — Other Ambulatory Visit: Payer: Self-pay | Admitting: Cardiovascular Disease

## 2024-03-18 ENCOUNTER — Other Ambulatory Visit (HOSPITAL_COMMUNITY): Payer: Self-pay

## 2024-03-18 ENCOUNTER — Other Ambulatory Visit: Payer: Self-pay

## 2024-03-19 ENCOUNTER — Other Ambulatory Visit (HOSPITAL_COMMUNITY): Payer: Self-pay

## 2024-03-19 MED ORDER — CLOPIDOGREL BISULFATE 75 MG PO TABS
75.0000 mg | ORAL_TABLET | Freq: Every day | ORAL | 3 refills | Status: AC
  Filled 2024-03-19 – 2024-05-16 (×2): qty 90, 90d supply, fill #0

## 2024-04-13 ENCOUNTER — Other Ambulatory Visit (HOSPITAL_COMMUNITY): Payer: Self-pay

## 2024-04-29 ENCOUNTER — Telehealth: Payer: Self-pay | Admitting: Cardiovascular Disease

## 2024-04-29 NOTE — Telephone Encounter (Signed)
" °*  STAT* If patient is at the pharmacy, call can be transferred to refill team.   1. Which medications need to be refilled? (please list name of each medication and dose if known)  diltiazem  (CARDIZEM  CD) 120 MG 24 hr capsule  torsemide  (DEMADEX ) 20 MG tablet   2. Would you like to learn more about the convenience, safety, & potential cost savings by using the [facility]?    3. Are you open to using the [facility] (Type [facility].  ).  4. Which pharmacy/location (including street and city if local pharmacy) is medication to be sent to? [place] [facility]   5. Do they need a 30 day or 90 day supply? 90 day  "

## 2024-04-30 ENCOUNTER — Other Ambulatory Visit (HOSPITAL_COMMUNITY): Payer: Self-pay

## 2024-04-30 ENCOUNTER — Other Ambulatory Visit: Payer: Self-pay

## 2024-04-30 MED ORDER — TORSEMIDE 20 MG PO TABS
40.0000 mg | ORAL_TABLET | Freq: Every day | ORAL | 5 refills | Status: AC
  Filled 2024-04-30 – 2024-05-06 (×2): qty 60, 30d supply, fill #0
  Filled 2024-05-16 – 2024-06-02 (×2): qty 60, 30d supply, fill #1

## 2024-04-30 MED ORDER — DILTIAZEM HCL ER COATED BEADS 120 MG PO CP24
120.0000 mg | ORAL_CAPSULE | Freq: Three times a day (TID) | ORAL | 2 refills | Status: AC
  Filled 2024-04-30 – 2024-05-06 (×2): qty 270, 90d supply, fill #0
  Filled 2024-05-16: qty 270, 90d supply, fill #1

## 2024-05-06 ENCOUNTER — Other Ambulatory Visit (HOSPITAL_COMMUNITY): Payer: Self-pay

## 2024-05-06 ENCOUNTER — Other Ambulatory Visit: Payer: Self-pay

## 2024-05-16 ENCOUNTER — Other Ambulatory Visit: Payer: Self-pay | Admitting: Family Medicine

## 2024-05-16 ENCOUNTER — Other Ambulatory Visit: Payer: Self-pay

## 2024-05-17 ENCOUNTER — Other Ambulatory Visit (HOSPITAL_COMMUNITY): Payer: Self-pay

## 2024-05-17 MED ORDER — METFORMIN HCL 500 MG PO TABS
1000.0000 mg | ORAL_TABLET | Freq: Two times a day (BID) | ORAL | 1 refills | Status: AC
  Filled 2024-05-17: qty 360, 90d supply, fill #0

## 2024-05-29 ENCOUNTER — Ambulatory Visit: Payer: Self-pay

## 2024-05-29 NOTE — Telephone Encounter (Signed)
 FYI Only or Action Required?: FYI only for provider: appointment scheduled on 05/30/2024.  Patient was last seen in primary care on 02/13/2024 by Myrla Jon HERO, MD.  Called Nurse Triage reporting Blood Sugar Problem.  Symptoms began several months ago.  Interventions attempted: Dietary changes.  Symptoms are: gradually worsening.  Triage Disposition: See Physician Within 24 Hours  Patient/caregiver understands and will follow disposition?: Yes Message from [facility] sent at 05/29/2024  9:15 AM EST  Reason for Triage: elevated blood sugar- fasting 190, vision blurred, dry mouth   Reason for Disposition  [1] Symptoms of high blood sugar (e.g., increased thirst, frequent urination, weight loss) AND [2] not able to test blood glucose    Patient able to test blood sugar.  Highest fasting blood sugar today at 190  Answer Assessment - Initial Assessment Questions Controls blood sugar through diet and exercise.  Has noticed blood sugar increasing since the holidays with not change to diet or lifestyle  1. BLOOD GLUCOSE: What is your blood glucose level?      190 today.  Normal high is 160 2. ONSET: When did you check the blood glucose?     First thing in the morning 3. USUAL RANGE: What is your glucose level usually? (e.g., usual fasting morning value, usual evening value)     130-135 fasting in the morning 4. KETONES: Do you check for ketones (urine or blood test strips)? If Yes, ask: What does the test show now?      Has the strips, but has not checked 5. TYPE 1 or 2:  Do you know what type of diabetes you have?  (e.g., Type 1, Type 2, Gestational; doesn't know)      Type 2 6. INSULIN : Do you take insulin ? What type of insulin (s) do you use? What is the mode of delivery? (syringe, pen; injection or pump)?      Not used 7. DIABETES PILLS: Do you take any pills for your diabetes? If Yes, ask: Have you missed taking any pills recently?     metformin  8. OTHER  SYMPTOMS: Do you have any symptoms? (e.g., fever, frequent urination, difficulty breathing, dizziness, weakness, vomiting)     Dry mouth. Ear ache - achy most of the time with an occasional sharp pain, sinus symptoms present, but states that they are normal for her  Protocols used: Diabetes - High Blood Sugar-A-AH

## 2024-05-30 ENCOUNTER — Encounter: Payer: Self-pay | Admitting: Family Medicine

## 2024-05-30 ENCOUNTER — Ambulatory Visit: Admitting: Family Medicine

## 2024-05-30 VITALS — BP 134/44 | HR 67 | Temp 98.1°F | Resp 14 | Ht 63.0 in | Wt 131.9 lb

## 2024-05-30 DIAGNOSIS — H6591 Unspecified nonsuppurative otitis media, right ear: Secondary | ICD-10-CM

## 2024-05-30 DIAGNOSIS — I5022 Chronic systolic (congestive) heart failure: Secondary | ICD-10-CM | POA: Diagnosis not present

## 2024-05-30 DIAGNOSIS — Z7984 Long term (current) use of oral hypoglycemic drugs: Secondary | ICD-10-CM

## 2024-05-30 DIAGNOSIS — I152 Hypertension secondary to endocrine disorders: Secondary | ICD-10-CM | POA: Diagnosis not present

## 2024-05-30 DIAGNOSIS — E1169 Type 2 diabetes mellitus with other specified complication: Secondary | ICD-10-CM | POA: Diagnosis not present

## 2024-05-30 DIAGNOSIS — H9201 Otalgia, right ear: Secondary | ICD-10-CM

## 2024-05-30 DIAGNOSIS — E1159 Type 2 diabetes mellitus with other circulatory complications: Secondary | ICD-10-CM | POA: Diagnosis not present

## 2024-05-30 LAB — POCT GLYCOSYLATED HEMOGLOBIN (HGB A1C): Hemoglobin A1C: 7.2 % — AB (ref 4.0–5.6)

## 2024-05-30 MED ORDER — PREDNISONE 10 MG PO TABS: ORAL_TABLET | ORAL | 0 refills | Status: AC

## 2024-05-30 MED ORDER — SITAGLIPTIN PHOSPHATE 50 MG PO TABS: 50.0000 mg | ORAL_TABLET | Freq: Every day | ORAL | 2 refills | Status: AC

## 2024-05-30 NOTE — Assessment & Plan Note (Signed)
 F/b cardiology, stable and euvolemic.

## 2024-05-30 NOTE — Progress Notes (Signed)
 "  Established Patient Office Visit  Subjective   Patient ID: Jessica Wheeler, female    DOB: November 20, 1940  Age: 84 y.o. MRN: [id number]  Chief Complaint  Patient presents with   Acute Visit    Elevated blood sugars ongoing since November, pt reports has not been able to bring down. Normally around 160s.   Otalgia    Bilateral ear pain mainly R ear, runny nose, sinus pressure x 2 month nur:wjdjo spray.    [name] is a 84 y/o presenting today for chronic disease follow-up. She checks her sugars regularly and normally averages around 120's-130's but has been concerned due to elevated sugars into the high 160's. Normally able to regulate elevated sugars with diet and exercise changes but not the case in this instance. She noticed around the same time that she has been experiencing right ear pain. Has tried peroxide rinses with no relief. Describes a constant ache with intermittent sharp pains as well. Denies fever, or sore throat. Does endorse ear fullness in addition to her baseline rhinorrhea, with seasonal allergies.   Otalgia  Pertinent negatives include no sore throat.      Review of Systems  Constitutional:  Negative for chills and fever.  HENT:  Positive for ear pain. Negative for congestion and sore throat.       Objective:     BP (!) 134/44   Pulse 67   Temp 98.1 F (36.7 C) (Oral)   Resp 14   Ht 5' 3 (1.6 m)   Wt 131 lb 14.4 oz (59.8 kg)   SpO2 100%   BMI 23.37 kg/m  BP Readings from Last 3 Encounters:  05/30/24 (!) 134/44  02/29/24 (!) 136/50  02/13/24 (!) 122/50   Wt Readings from Last 3 Encounters:  05/30/24 131 lb 14.4 oz (59.8 kg)  02/29/24 132 lb 9.6 oz (60.1 kg)  02/13/24 130 lb 3.2 oz (59.1 kg)      Physical Exam Constitutional:      Appearance: Normal appearance.  HENT:     Right Ear: External ear normal.     Left Ear: External ear normal.     Ears:     Comments: Dermatitis bilaterally. Opacity of right tympanic membrane. Normal left  tympanic membrane.  Cardiovascular:     Rate and Rhythm: Normal rate and regular rhythm.     Pulses: Normal pulses.     Heart sounds: Normal heart sounds.  Pulmonary:     Effort: Pulmonary effort is normal.     Breath sounds: Normal breath sounds. No wheezing.  Neurological:     Mental Status: She is alert.      Results for orders placed or performed in visit on 05/30/24  POCT HgB A1C  Result Value Ref Range   Hemoglobin A1C 7.2 (A) 4.0 - 5.6 %    Last hemoglobin A1c Lab Results  Component Value Date   HGBA1C 7.2 (A) 05/30/2024      The ASCVD Risk score (Arnett DK, et al., 2019) failed to calculate for the following reasons:   The 2019 ASCVD risk score is only valid for ages 86 to 85   Risk score cannot be calculated because patient has a medical history suggesting prior/existing ASCVD   * - Cholesterol units were assumed    Assessment & Plan:   Problem List Items Addressed This Visit       Cardiovascular and Mediastinum   Hypertension associated with diabetes (HCC)   Well-controlled with diltiazem  120mg  and  losartan  25mg  daily. BP reading today of 134/44.  - Continue current antihypertensive management - Continue diabetic regimen to reduce associated CV risk      Relevant Medications   sitaGLIPtin  (JANUVIA ) 50 MG tablet   Chronic systolic congestive heart failure (HCC)   F/b cardiology, stable and euvolemic.         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 Currently managed with metformin  1000mg  twice daily. Blood glucose monitoring readings have consistently been in the 160's since Nov. A1c today 7.2 increased from 6.8 in Oct. Has tried jardiance  in the past with side effects of muscle aches. Discussed other available options with plan to start Januvia . - Continue metformin  1000mg  twice daily - Start Januvia  50mg  daily - Continue healthy diet and  exercise habits - Follow-up in 3 months to reassess management      Relevant Medications   sitaGLIPtin  (JANUVIA ) 50 MG tablet   Other Relevant Orders   POCT HgB A1C (Completed)     Other   Otalgia of right ear   Reports right ear pain since Nov with associated fullness. Denies fevers and sore throat. Opacity of right tympanic membrane noted on exam. Most likely otits media effusion. Less likely to be infection due to duration of symptoms and lack of systemic symptoms. Discussed burst and taper of steroids with risk of hyperglycemia during this time.  - Start prednisone  taper of 60mg  x1, 50mg  x1, 40mg  x1, 30 mg x1, 20mg  x1, 10mg  x1      Other Visit Diagnoses       Chronic systolic heart failure (HCC)         Fluid level behind tympanic membrane of right ear           Return in about 3 months (around 08/28/2024) for chronic disease f/u, as scheduled.    Kamryn J Mills, Medical Student  Patient seen along with MS3 student, Johnsie Barefoot. I personally evaluated this patient along with the student, and verified all aspects of the history, physical exam, and medical decision making as documented by the student. I agree with the student's documentation and have made all necessary edits.  Joss Mcdill, Jon HERO, MD, MPH [facility]    "

## 2024-05-30 NOTE — Telephone Encounter (Signed)
 Noted.

## 2024-05-30 NOTE — Assessment & Plan Note (Signed)
 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 Currently managed with metformin  1000mg  twice daily. Blood glucose monitoring readings have consistently been in the 160's since Nov. A1c today 7.2 increased from 6.8 in Oct. Has tried jardiance  in the past with side effects of muscle aches. Discussed other available options with plan to start Januvia . - Continue metformin  1000mg  twice daily - Start Januvia  50mg  daily - Continue healthy diet and exercise habits - Follow-up in 3 months to reassess management

## 2024-05-30 NOTE — Assessment & Plan Note (Signed)
 Well-controlled with diltiazem  120mg  and losartan  25mg  daily. BP reading today of 134/44.  - Continue current antihypertensive management - Continue diabetic regimen to reduce associated CV risk

## 2024-05-30 NOTE — Assessment & Plan Note (Signed)
 Reports right ear pain since Nov with associated fullness. Denies fevers and sore throat. Opacity of right tympanic membrane noted on exam. Most likely otits media effusion. Less likely to be infection due to duration of symptoms and lack of systemic symptoms. Discussed burst and taper of steroids with risk of hyperglycemia during this time.  - Start prednisone  taper of 60mg  x1, 50mg  x1, 40mg  x1, 30 mg x1, 20mg  x1, 10mg  x1

## 2024-06-02 ENCOUNTER — Other Ambulatory Visit (HOSPITAL_COMMUNITY): Payer: Self-pay

## 2024-08-15 ENCOUNTER — Encounter: Admitting: Family Medicine

## 2024-09-04 ENCOUNTER — Ambulatory Visit
